# Patient Record
Sex: Female | Born: 1945
Health system: Southern US, Community
[De-identification: ages and names within clinical notes are randomized; demographics above are authoritative.]

## PROBLEM LIST (undated history)

## (undated) DIAGNOSIS — M1990 Unspecified osteoarthritis, unspecified site: Secondary | ICD-10-CM

## (undated) DIAGNOSIS — IMO0001 Reserved for inherently not codable concepts without codable children: Secondary | ICD-10-CM

## (undated) DIAGNOSIS — I219 Acute myocardial infarction, unspecified: Secondary | ICD-10-CM

## (undated) DIAGNOSIS — G43909 Migraine, unspecified, not intractable, without status migrainosus: Secondary | ICD-10-CM

## (undated) DIAGNOSIS — G4730 Sleep apnea, unspecified: Secondary | ICD-10-CM

## (undated) DIAGNOSIS — E119 Type 2 diabetes mellitus without complications: Secondary | ICD-10-CM

## (undated) DIAGNOSIS — R0602 Shortness of breath: Secondary | ICD-10-CM

## (undated) DIAGNOSIS — K219 Gastro-esophageal reflux disease without esophagitis: Secondary | ICD-10-CM

## (undated) DIAGNOSIS — J45909 Unspecified asthma, uncomplicated: Secondary | ICD-10-CM

## (undated) DIAGNOSIS — S82899A Other fracture of unspecified lower leg, initial encounter for closed fracture: Secondary | ICD-10-CM

## (undated) DIAGNOSIS — M545 Low back pain, unspecified: Secondary | ICD-10-CM

## (undated) DIAGNOSIS — M810 Age-related osteoporosis without current pathological fracture: Secondary | ICD-10-CM

## (undated) DIAGNOSIS — G8929 Other chronic pain: Secondary | ICD-10-CM

## (undated) DIAGNOSIS — H409 Unspecified glaucoma: Secondary | ICD-10-CM

## (undated) DIAGNOSIS — M109 Gout, unspecified: Secondary | ICD-10-CM

## (undated) DIAGNOSIS — D649 Anemia, unspecified: Secondary | ICD-10-CM

## (undated) DIAGNOSIS — E669 Obesity, unspecified: Secondary | ICD-10-CM

## (undated) DIAGNOSIS — J449 Chronic obstructive pulmonary disease, unspecified: Secondary | ICD-10-CM

## (undated) DIAGNOSIS — I10 Essential (primary) hypertension: Secondary | ICD-10-CM

## (undated) HISTORY — DX: Chronic obstructive pulmonary disease, unspecified: J44.9

## (undated) HISTORY — DX: Reserved for inherently not codable concepts without codable children: IMO0001

## (undated) HISTORY — DX: Unspecified glaucoma: H40.9

## (undated) HISTORY — PX: TONSILLECTOMY: SUR1361

## (undated) HISTORY — DX: Other fracture of unspecified lower leg, initial encounter for closed fracture: S82.899A

## (undated) HISTORY — DX: Other chronic pain: G89.29

## (undated) HISTORY — DX: Obesity, unspecified: E66.9

## (undated) HISTORY — PX: JOINT REPLACEMENT: SHX530

---

## 1978-11-02 HISTORY — PX: GANGLION CYST EXCISION: SHX1691

## 1978-11-02 HISTORY — PX: KNEE LIGAMENT RECONSTRUCTION: SHX1895

## 1988-11-01 HISTORY — PX: ABDOMINAL HYSTERECTOMY: SHX81

## 1990-03-03 DIAGNOSIS — I219 Acute myocardial infarction, unspecified: Secondary | ICD-10-CM

## 1990-03-03 HISTORY — DX: Acute myocardial infarction, unspecified: I21.9

## 1997-09-14 ENCOUNTER — Ambulatory Visit (HOSPITAL_COMMUNITY): Admission: RE | Admit: 1997-09-14 | Discharge: 1997-09-14 | Payer: Self-pay | Admitting: Pulmonary Disease

## 1997-12-15 ENCOUNTER — Ambulatory Visit (HOSPITAL_COMMUNITY): Admission: RE | Admit: 1997-12-15 | Discharge: 1997-12-15 | Payer: Self-pay | Admitting: Neurosurgery

## 1997-12-29 ENCOUNTER — Ambulatory Visit (HOSPITAL_COMMUNITY): Admission: RE | Admit: 1997-12-29 | Discharge: 1997-12-29 | Payer: Self-pay | Admitting: Neurosurgery

## 1997-12-29 ENCOUNTER — Encounter: Payer: Self-pay | Admitting: Neurosurgery

## 1998-01-12 ENCOUNTER — Encounter: Payer: Self-pay | Admitting: Neurosurgery

## 1998-01-12 ENCOUNTER — Ambulatory Visit (HOSPITAL_COMMUNITY): Admission: RE | Admit: 1998-01-12 | Discharge: 1998-01-12 | Payer: Self-pay | Admitting: Neurosurgery

## 1998-05-31 ENCOUNTER — Emergency Department (HOSPITAL_COMMUNITY): Admission: EM | Admit: 1998-05-31 | Discharge: 1998-05-31 | Payer: Self-pay | Admitting: Emergency Medicine

## 1998-08-28 ENCOUNTER — Encounter: Payer: Self-pay | Admitting: Neurosurgery

## 1998-08-28 ENCOUNTER — Ambulatory Visit (HOSPITAL_COMMUNITY): Admission: RE | Admit: 1998-08-28 | Discharge: 1998-08-28 | Payer: Self-pay | Admitting: Neurosurgery

## 1998-08-29 ENCOUNTER — Ambulatory Visit (HOSPITAL_COMMUNITY): Admission: RE | Admit: 1998-08-29 | Discharge: 1998-08-29 | Payer: Self-pay | Admitting: Neurosurgery

## 1998-08-29 ENCOUNTER — Encounter: Payer: Self-pay | Admitting: Neurosurgery

## 1999-03-04 HISTORY — PX: TOTAL KNEE ARTHROPLASTY: SHX125

## 1999-04-19 ENCOUNTER — Encounter: Payer: Self-pay | Admitting: Orthopedic Surgery

## 1999-04-23 ENCOUNTER — Inpatient Hospital Stay (HOSPITAL_COMMUNITY): Admission: RE | Admit: 1999-04-23 | Discharge: 1999-04-29 | Payer: Self-pay | Admitting: Orthopedic Surgery

## 1999-06-04 ENCOUNTER — Encounter: Admission: RE | Admit: 1999-06-04 | Discharge: 1999-06-25 | Payer: Self-pay | Admitting: Orthopedic Surgery

## 2000-06-13 ENCOUNTER — Emergency Department (HOSPITAL_COMMUNITY): Admission: EM | Admit: 2000-06-13 | Discharge: 2000-06-13 | Payer: Self-pay | Admitting: *Deleted

## 2000-06-13 ENCOUNTER — Encounter: Payer: Self-pay | Admitting: *Deleted

## 2001-07-17 ENCOUNTER — Emergency Department (HOSPITAL_COMMUNITY): Admission: EM | Admit: 2001-07-17 | Discharge: 2001-07-17 | Payer: Self-pay

## 2001-08-26 ENCOUNTER — Encounter: Payer: Self-pay | Admitting: Orthopedic Surgery

## 2001-08-26 ENCOUNTER — Encounter: Admission: RE | Admit: 2001-08-26 | Discharge: 2001-08-26 | Payer: Self-pay | Admitting: Orthopedic Surgery

## 2002-03-03 HISTORY — PX: TOTAL KNEE ARTHROPLASTY: SHX125

## 2002-04-04 ENCOUNTER — Ambulatory Visit (HOSPITAL_COMMUNITY): Admission: RE | Admit: 2002-04-04 | Discharge: 2002-04-04 | Payer: Self-pay | Admitting: Pulmonary Disease

## 2002-04-04 ENCOUNTER — Encounter: Payer: Self-pay | Admitting: Pulmonary Disease

## 2002-04-05 ENCOUNTER — Encounter: Payer: Self-pay | Admitting: Emergency Medicine

## 2002-04-05 ENCOUNTER — Emergency Department (HOSPITAL_COMMUNITY): Admission: EM | Admit: 2002-04-05 | Discharge: 2002-04-05 | Payer: Self-pay | Admitting: Emergency Medicine

## 2002-05-26 ENCOUNTER — Encounter: Admission: RE | Admit: 2002-05-26 | Discharge: 2002-05-26 | Payer: Self-pay | Admitting: Pulmonary Disease

## 2002-05-26 ENCOUNTER — Encounter: Payer: Self-pay | Admitting: Pulmonary Disease

## 2002-05-27 ENCOUNTER — Encounter: Payer: Self-pay | Admitting: Orthopedic Surgery

## 2002-05-31 ENCOUNTER — Inpatient Hospital Stay (HOSPITAL_COMMUNITY): Admission: RE | Admit: 2002-05-31 | Discharge: 2002-06-07 | Payer: Self-pay | Admitting: Orthopedic Surgery

## 2002-06-03 ENCOUNTER — Encounter: Payer: Self-pay | Admitting: Orthopedic Surgery

## 2002-06-04 ENCOUNTER — Encounter: Payer: Self-pay | Admitting: Orthopedic Surgery

## 2002-06-07 ENCOUNTER — Inpatient Hospital Stay (HOSPITAL_COMMUNITY)
Admission: RE | Admit: 2002-06-07 | Discharge: 2002-06-11 | Payer: Self-pay | Admitting: Physical Medicine & Rehabilitation

## 2002-07-12 ENCOUNTER — Encounter: Admission: RE | Admit: 2002-07-12 | Discharge: 2002-07-27 | Payer: Self-pay | Admitting: Orthopedic Surgery

## 2002-08-11 ENCOUNTER — Encounter: Admission: RE | Admit: 2002-08-11 | Discharge: 2002-08-11 | Payer: Self-pay | Admitting: Pulmonary Disease

## 2002-08-11 ENCOUNTER — Encounter: Payer: Self-pay | Admitting: Pulmonary Disease

## 2002-08-30 ENCOUNTER — Encounter: Admission: RE | Admit: 2002-08-30 | Discharge: 2002-08-30 | Payer: Self-pay | Admitting: Pulmonary Disease

## 2002-08-30 ENCOUNTER — Encounter: Payer: Self-pay | Admitting: Pulmonary Disease

## 2002-11-09 ENCOUNTER — Other Ambulatory Visit: Admission: RE | Admit: 2002-11-09 | Discharge: 2002-11-09 | Payer: Self-pay | Admitting: Obstetrics and Gynecology

## 2002-11-09 ENCOUNTER — Encounter: Payer: Self-pay | Admitting: Pulmonary Disease

## 2002-11-09 ENCOUNTER — Ambulatory Visit (HOSPITAL_COMMUNITY): Admission: RE | Admit: 2002-11-09 | Discharge: 2002-11-09 | Payer: Self-pay | Admitting: Pulmonary Disease

## 2002-11-16 ENCOUNTER — Encounter: Payer: Self-pay | Admitting: Pulmonary Disease

## 2002-11-16 ENCOUNTER — Ambulatory Visit (HOSPITAL_COMMUNITY): Admission: RE | Admit: 2002-11-16 | Discharge: 2002-11-16 | Payer: Self-pay | Admitting: Pulmonary Disease

## 2002-11-17 ENCOUNTER — Ambulatory Visit (HOSPITAL_COMMUNITY): Admission: RE | Admit: 2002-11-17 | Discharge: 2002-11-17 | Payer: Self-pay | Admitting: Obstetrics and Gynecology

## 2002-11-17 ENCOUNTER — Encounter: Payer: Self-pay | Admitting: Obstetrics and Gynecology

## 2003-01-23 ENCOUNTER — Encounter (INDEPENDENT_AMBULATORY_CARE_PROVIDER_SITE_OTHER): Payer: Self-pay

## 2003-01-23 ENCOUNTER — Ambulatory Visit (HOSPITAL_COMMUNITY): Admission: RE | Admit: 2003-01-23 | Discharge: 2003-01-23 | Payer: Self-pay | Admitting: *Deleted

## 2003-12-06 ENCOUNTER — Encounter: Admission: RE | Admit: 2003-12-06 | Discharge: 2004-01-31 | Payer: Self-pay | Admitting: Pulmonary Disease

## 2004-06-17 ENCOUNTER — Ambulatory Visit (HOSPITAL_COMMUNITY): Admission: RE | Admit: 2004-06-17 | Discharge: 2004-06-17 | Payer: Self-pay | Admitting: Pulmonary Disease

## 2005-09-09 ENCOUNTER — Other Ambulatory Visit: Admission: RE | Admit: 2005-09-09 | Discharge: 2005-09-09 | Payer: Self-pay | Admitting: Obstetrics and Gynecology

## 2006-05-21 ENCOUNTER — Ambulatory Visit (HOSPITAL_BASED_OUTPATIENT_CLINIC_OR_DEPARTMENT_OTHER): Admission: RE | Admit: 2006-05-21 | Discharge: 2006-05-21 | Payer: Self-pay | Admitting: Pulmonary Disease

## 2006-05-24 ENCOUNTER — Ambulatory Visit: Payer: Self-pay | Admitting: Internal Medicine

## 2006-10-29 ENCOUNTER — Emergency Department (HOSPITAL_COMMUNITY): Admission: EM | Admit: 2006-10-29 | Discharge: 2006-10-29 | Payer: Self-pay | Admitting: Emergency Medicine

## 2007-01-06 ENCOUNTER — Ambulatory Visit (HOSPITAL_COMMUNITY): Admission: RE | Admit: 2007-01-06 | Discharge: 2007-01-06 | Payer: Self-pay | Admitting: Pulmonary Disease

## 2007-04-09 ENCOUNTER — Ambulatory Visit (HOSPITAL_COMMUNITY): Admission: RE | Admit: 2007-04-09 | Discharge: 2007-04-09 | Payer: Self-pay | Admitting: Pulmonary Disease

## 2007-07-14 ENCOUNTER — Emergency Department (HOSPITAL_COMMUNITY): Admission: EM | Admit: 2007-07-14 | Discharge: 2007-07-14 | Payer: Self-pay | Admitting: Emergency Medicine

## 2008-11-17 ENCOUNTER — Ambulatory Visit (HOSPITAL_COMMUNITY): Admission: RE | Admit: 2008-11-17 | Discharge: 2008-11-17 | Payer: Self-pay | Admitting: Pulmonary Disease

## 2009-03-24 ENCOUNTER — Emergency Department (HOSPITAL_COMMUNITY): Admission: EM | Admit: 2009-03-24 | Discharge: 2009-03-24 | Payer: Self-pay | Admitting: Emergency Medicine

## 2009-03-27 ENCOUNTER — Observation Stay (HOSPITAL_COMMUNITY): Admission: EM | Admit: 2009-03-27 | Discharge: 2009-03-28 | Payer: Self-pay | Admitting: Emergency Medicine

## 2010-05-20 LAB — BASIC METABOLIC PANEL
BUN: 10 mg/dL (ref 6–23)
CO2: 28 mEq/L (ref 19–32)
Chloride: 103 mEq/L (ref 96–112)
Creatinine, Ser: 0.92 mg/dL (ref 0.4–1.2)
Potassium: 3.4 mEq/L — ABNORMAL LOW (ref 3.5–5.1)
Sodium: 138 mEq/L (ref 135–145)

## 2010-05-20 LAB — CBC
Hemoglobin: 12 g/dL (ref 12.0–15.0)
MCHC: 33.5 g/dL (ref 30.0–36.0)
Platelets: 247 10*3/uL (ref 150–400)
RBC: 4.08 MIL/uL (ref 3.87–5.11)
RDW: 13.9 % (ref 11.5–15.5)
WBC: 4.4 10*3/uL (ref 4.0–10.5)

## 2010-05-20 LAB — COMPREHENSIVE METABOLIC PANEL
ALT: 14 U/L (ref 0–35)
Alkaline Phosphatase: 63 U/L (ref 39–117)
BUN: 10 mg/dL (ref 6–23)
CO2: 28 mEq/L (ref 19–32)
GFR calc Af Amer: >60 mL/min (ref >60)
GFR calc non Af Amer: >60 mL/min (ref >60)
Glucose, Bld: 85 mg/dL (ref 70–99)
Total Protein: 7.9 g/dL (ref 6.0–8.3)

## 2010-05-20 LAB — URINALYSIS, ROUTINE W REFLEX MICROSCOPIC
Glucose, UA: NEGATIVE mg/dL
Hgb urine dipstick: NEGATIVE
Protein, ur: NEGATIVE mg/dL
Specific Gravity, Urine: 1.021 (ref 1.005–1.030)
Urobilinogen, UA: 0.2 mg/dL (ref 0.0–1.0)

## 2010-05-20 LAB — DIFFERENTIAL
Basophils Relative: 1 % (ref 0–1)
Eosinophils Absolute: 0.1 10*3/uL (ref 0.0–0.7)
Eosinophils Absolute: 0.1 10*3/uL (ref 0.0–0.7)
Eosinophils Relative: 3 % (ref 0–5)
Eosinophils Relative: 3 % (ref 0–5)
Lymphs Abs: 1.9 10*3/uL (ref 0.7–4.0)
Monocytes Absolute: 0.5 10*3/uL (ref 0.1–1.0)
Monocytes Relative: 11 % (ref 3–12)
Neutrophils Relative %: 55 % (ref 43–77)

## 2010-07-19 NOTE — Discharge Summary (Signed)
NAME:  Sandra Brennan, Sandra Brennan                         ACCOUNT NO.:  [id number]   MEDICAL RECORD NO.:  [id number]                   PATIENT TYPE:  IPS   LOCATION:  4155                                 FACILITY:  [facility]   PHYSICIAN:  Ranelle Oyster, M.D.             DATE OF BIRTH:  December 29, 1945   DATE OF ADMISSION:  06/07/2002  DATE OF DISCHARGE:  06/11/2002                                 DISCHARGE SUMMARY   DISCHARGE DIAGNOSES:  1. Right total arthroplasty secondary to osteoarthritis.  2. History of hypertension.  3. Urinary tract infection.   HISTORY OF PRESENT ILLNESS:  The patient is a 65 year old black female with  a past medical history of obesity, right knee pain times four to five years,  admitted on May 31, 2002 for right total knee arthroplasty secondary to  DJD by Dr. Lenard Galloway. Mort[place].  She is presently on Arixtra for DVT  prophylaxis.  PT report at this time reveals the patient is partial  weightbearing 50%, ambulating 100 feet with rolling walker, can transfer at  supervision level.  Hospital course was significant for hyponatremia, anemia  and elevated temperature secondary to UTI, presently on Tequin.  She is to  follow up with Dr. Chaney Malling within one week after discharge.  The patient  was transferred to [facility] Acute Care Department on June 07, 2002.   PRIMARY CARE Elleana Stillson:  Dr. Mina Marble.   PAST MEDICAL HISTORY:  Past medical history is significant for asthma, CAD,  MI, GERD, DDD, obesity and OA.   PAST SURGICAL HISTORY:  Past surgical history is significant for a left  total knee arthroplasty, hysterectomy, right reconstructive surgery.   ALLERGIES:  THEOPHYLLINE.   SOCIAL HISTORY:  The patient lives in a one-level home with ramp alone in  [place], separated.  She has four children.  She is disabled, previously  a Engineer, site.  She was independent prior to admission.   FAMILY HISTORY:  Family history is noncontributory.   REVIEW OF  SYSTEMS:  Review of systems is significant for obesity, joint pain  and chest pain.   HOSPITAL COURSE:  The patient was admitted to [facility] Rehab  Department on June 07, 2002 for comprehensive inpatient rehabilitation,  where she received more than three hours of therapy daily.  Overall, the  patient made great progress during her short four-day stay in rehab.  She  was discharged on a modified independent level.  She will remain on Arixtra  for approximately 7 to 10 days for DVT prophylaxis.  Hospital course was  significant for urinary tract infection, constipation.  The patient received  sorbitol p.r.n. as needed and constipation did resolve.  Initially, on June 10, 2002, she was started on amoxicillin 250 mg p.o. for seven days t.i.d.  for urinary tract infection.  Urine culture came back demonstrating  resistance to amoxicillin, therefore, at time of discharge, she was switched  to Macrodantin 100 mg p.o. b.i.d. x5 days.  Initially, the patient's Maxzide  was placed on hold due to hyponatremia.  Sodium level had begun to improve  and blood pressure then was slightly elevated; at that point she was started  back on her Maxzide.  Pain was being controlled with Vicodin as needed.  There were no other major issues that occurred while in rehab.  She made  great progress in a short time.  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  There were no other major issues  that occurred while in rehab.   Latest labs indicate latest hemoglobin was 9.3, hematocrit was 28.6, white  blood cell count was 2.8, platelet count was 367,000.  Latest sodium was  134, potassium 4.0, chloride 94, CO2 30, glucose 138, BUN 11, creatinine  1.1, AST 18, ALT 18 as well.  Urine cultures performed on June 07, 2002:  Greater than 100,000 colonies of Staphylococcus species, coagulase-negative.  At  time of discharge, per PT report, all vitals were stable, blood pressure  131/64, respiratory rate was 20, pulse 105, temperature was 99.5.  PT report  indicated that the patient is ambulating, modified independently, with  standard walker 200 feet, can transfer sit to stand, modified independently,  bed mobility -- modified independent.  She can perform most ADLs, modified  independently.  At time of discharge, staples are intact, healing well and  staples are to be removed at Dr. Andreas Blower office per his request.  At  discharge, the patient did have a slight temperature, but antibiotic was  switched in hopes this will improve.   DISCHARGE MEDICATIONS:  1. Macrodantin 100 mg p.o. b.i.d.  2. Maxzide resumed.  3. Vicodin one to two tablets every four to six hours as needed for pain.  4. Tylenol as needed for pain.  5. Multivitamin with iron daily.  6. Os-Cal plus D daily.   PAIN MANAGEMENT:  She will have pain management with Tylenol and Vicodin.   ACTIVITY:  She is to use a walker.  No driving, no drinking alcohol.  She  may maintain partial weightbearing of 50%.   WOUND CARE:  She is to have staples removed at Dr. Andreas Blower office on  June 13, 2002 at 2 p.m.   SPECIAL DISCHARGE INSTRUCTIONS:  She will have [facility] for PT,  OT, and R.N.    FOLLOWUP:  She will have followup in Dr. Andreas Blower office next Monday,  June 13, 2002, as stated above, follow up with Dr. Petra Kuba within four  to six weeks to monitor anemia, follow up with Dr. Ranelle Oyster as  needed.      Junie Bame, P.A.                       Ranelle Oyster, M.D.    LH/MEDQ  D:  07/14/2002  T:  07/16/2002  Job:  [id number]   cc:   Eino Farber., M.D.  [address] [place]  [place] [address]  Fax: [phone]   Lenard Galloway. Chaney Malling, M.D.  [address] [place]  [place] [address]  Fax: [phone]

## 2010-07-19 NOTE — H&P (Signed)
NAME:  Sandra Brennan, Sandra Brennan                         ACCOUNT NO.:  [id number]   MEDICAL RECORD NO.:  [id number]                   PATIENT TYPE:  INP   LOCATION:  NA                                   FACILITY:  [facility]   PHYSICIAN:  Thereasa Distance A. Chaney Malling, M.D.           DATE OF BIRTH:  07-29-1945   DATE OF ADMISSION:  05/31/2002  DATE OF DISCHARGE:                                HISTORY & PHYSICAL   CHIEF COMPLAINT:  Right knee pain for the last 4-5 years.   HISTORY OF PRESENT ILLNESS:  This 65 year old black female patient presented  to Dr. Chaney Malling with a history of a left knee replacement by him on  April 23, 1999.  Her left knee has been doing well but she has been  having more problems with her right knee.  She does have a history of a fall  out of a two-story window approximately 20 years ago and she had to have  right knee reconstruction by Dr. Fraser Din approximately 20 years ago.  Over  the last 4-5 years, she has had a gradual onset of progressively worsening  right knee pain.  No new injury but the pain has gotten worse over the last  year.   At this point, the pain in the right knee is described as a constant  soreness which becomes kind of sharp and stabbing with certain movements.  The pain seems to be located diffusely about the joint line with some  radiation proximally into her thigh and distally into her calf.  She does  have a history of degenerative disk disease in the lumbar spine and reports  that Dr. Franky Macho has wanted to do a fusion of her lumbar spine.  She has  just recently received an epidural steroid injection and that caused the  pain that was in her right thigh to be dissipated.  The pain in her right  knee does increase with certain movements and if she walks or stands for a  prolonged period of time.  The pain decreased with elevation and if she rubs  the knee.  She is not currently taking any medications for pain and she has  not recently had any cortisone  or Hyalgan in the knee.  She does complain of  the knee popping, grinding, and swelling at times.  It also gives way on  her.  She had an episode approximately six weeks ago of the knee locking on  her and she had to go to the ER before it would finally release.  She does  have a cane, walker, and crutches at home which she uses just  intermittently.   ALLERGIES:  1. THEOPHYLLINE causes welts and hyperventilation.  2. BEE STINGS cause swelling.   CURRENT MEDICATIONS:  1. Maxzide 37.5/25 mg 1/2 tablet p.o. daily.  2. Arthrotec 75 mg 1 tablet p.o. b.i.d.  3. Flonase Nasal Spray 1 spray each nares p.r.n.  PAST MEDICAL HISTORY:  1. She has had hypertension for the last 10 years.  2. Asthma--she has had it since a child.  She has flares with increased     stress or increased dust in the air.  This has been treated with p.r.n.     inhalers.  Her last attack was approximately a year ago and at that time     she goes to either Urgent Care or the hospital for albuterol treatments.  3. History of myocardial infarction in 1992 followed by Dr. Petra Kuba.  4. Degenerative disk disease of the lumbar spine treated by Dr. Coletta Memos.  5. Gastroesophageal reflux disease for the last 2-3 years treated with diet     only at this time.  6. She denies any history of diabetes mellitus, thyroid disease, hiatal     hernia, peptic ulcer disease, or any other chronic medical condition     other than noted previously.   PAST SURGICAL HISTORY:  1. Hysterectomy by Dr. Iran Planas in 1991.  2. Repair of torn right knee ligaments, Dr. Fraser Din, 20 years ago.  3. Excision of right wrist ganglion by Dr. Shon Hough.  4. Left knee arthroscopy by Dr. Rinaldo Ratel October 26, 1987.  5. Tonsillectomy as a child.  6. Left total knee arthroplasty by Dr. Rinaldo Ratel April 23, 1999.   SOCIAL HISTORY:  She has a history of being an alcoholic but has been sober  for about 20 years.  When she was an  alcoholic, she drank approximately a 12-  pack or more on the weekend for about four years.  She denies any history of  cigarette smoking or drug use.  She is separated and has five children.  Four of her children are still living.  She lives by herself in a one-story  house with a ramp.  She is a disabled Manufacturing systems engineer.  Her medical doctor  is Dr. Petra Kuba here in [place].   FAMILY HISTORY:  Her mother is alive at age 42 with hypertension, history of  a stroke, and ovarian cancer.  Her father died at the age of 74 with a  history of alcoholism, heart disease, hypertension, and diabetes mellitus.  She has three living brothers age 79, 43, and 62 and they are all healthy.  She has one brother who died at age 8 due to a motor vehicle accident.  She  has three sisters; one age 45 with history of ovarian cancer, one age 28  with acquired immunodeficiency syndrome, and one age 74 who is healthy.  Her  35 year old son was murdered.  She has a 71 year old daughter with heart  disease and then three other children ages 86, 5, and 48 with the 31-year-  old son being a paraplegic from a motor vehicle accident.   REVIEW OF SYSTEMS:  She has a history of migraines but has not had one for  25+ years.  She complains of some sinus congestion with her asthma attacks  and with increased allergens in the air.  She does avoid stairs because of  the pain in her knee and she does get short of breath with that.  She has  had the hepatitis vaccine.  She wears glasses.  She recently had a CT scan  to evaluate some vision changes and headaches and that was negative for any  pathology.  She has been having some problems with constipation, which she  is treating with diet.  She has recently had  a chronic severe yeast  infection and she was scheduled to see the OB/GYN on March 30 but will  reschedule that for after surgery.  She does have severe degenerative disk disease in her lumbar spine and had her  first epidural steroid treatment on  May 26, 2002.  She does have some intermittent numbness in both feet.  She  does not have a living will nor a power-of-attorney.  All other systems are  negative and noncontributory.   PHYSICAL EXAMINATION:  GENERAL:  A well-developed, well-nourished, obese  black female who walks with an antalgic gait and a significant right-sided  limp.  Mood and affect are appropriate.  Talks easily with the examiner.  Height is 5 feet 1 inch, weight is 280 pounds.  BMI is 52-1/2.  VITAL SIGNS:  Temperature 98.1 degrees Fahrenheit, pulse 76, respirations  22, and blood pressure 136/90.  HEENT:  Normocephalic, atraumatic without frontal or maxillary sinus  tenderness to palpation.  Conjunctivae are pink.  Sclerae are anicteric.  PERRLA.  EOM's intact.  No visible external ear deformities.  Hearing  grossly intact.  Tympanic membranes pearly-gray bilaterally with good light  reflex.  Nose and nasal septum midline.  Nasal mucosa pink and moist without  exudates or polyps noted.  Buccal mucosa pink and moist.  She has several  missing teeth but otherwise dentition in good repair.  Pharynx without  erythema or exudates.  Tongue and uvula midline.  Tongue without  vesiculations and uvula rises equally with phonation.  NECK:  No visible masses or lesions noted.  Trachea midline.  No palpable  lymphadenopathy nor thyromegaly.  Carotids +2 bilaterally without bruits.  Full range of motion and nontender to palpation along the cervical spine.  CARDIOVASCULAR:  Heart rate and rhythm regular.  S1 and S2 present without  rubs, clicks, or murmurs noted.  RESPIRATORY:  Respirations even and unlabored.  Breath sounds clear to  auscultation bilaterally without rales or wheezes noted.  ABDOMEN:  Large, obese abdominal contour.  Bowel sounds present x4  quadrants.  She is soft and nontender to palpation without  hepatosplenomegaly nor CVA tenderness.  Her femoral pulses are +2   bilaterally and she is nontender to palpation along the entire length of the  vertebral column.  BREASTS/GENITOURINARY/RECTAL/PELVIC:  These exams deferred at this time.  MUSCULOSKELETAL:  She has no obvious deformities of her bilateral upper  extremities with full range of motion of her elbows, wrists, and fingers  bilaterally.  Radial pulses are +2.  She has good range of motion of both  shoulders but does complain of pain with adduction or internal/external  rotation of both shoulders.  She has full range of motion of her hips,  ankles, and toes bilaterally.  DP and PT pulses are +2.  She has mild +1  bilateral lower extremity edema which is nonpitting.  Negative Homans sign  and no calf pain with palpation bilaterally.  The left knee has a well-  healed slightly widened midline incision line.  There is no erythema or  ecchymosis noted.  She has full extension and flexion to 100 degrees.  There is no crepitus with range of motion and she has no pain with palpation along  the joint line.  The joint is stable to varus and valgus stress and has a  negative anterior drawer.  Right knee has a well-healed anteromedial  incision line and then again far lateral incision line.  Skin is otherwise  intact without erythema or ecchymosis.  She has full extension and flexion  to 90 degrees with a large amount of crepitus with range of motion.  She is  acutely tender to palpation over the medial joint lines, none laterally.  She also complains of pain when testing varus and valgus stress and she  tenses up, so it is difficult to tell if she opens up with that.  Negative  anterior drawer.  NEUROLOGIC:  Alert and oriented x3.  Cranial nerves II-XII are grossly  intact.  Strength is 5/5 bilateral upper and lower extremities.  Rapid  alternating movements intact.  Deep tendon reflexes 2+ bilateral upper and  1+ bilateral lower extremities.   RADIOLOGIC FINDINGS:  X-rays taken of her right knee on May 25, 2002 show  absolutely no joint space in the medial compartment of the right knee with  several pins noted in the tibia.  She has significant osteophytes on both  the medial and lateral aspects of the femur and tibia.  Significant  arthritic spurring noted in the patellofemoral compartment.   IMPRESSION:  1. End-stage osteoarthritis of the right knee, status post left knee     replacement in February 2001.  2. Hypertension.  3. Asthma.  4. Obesity.  5. Gastroesophageal reflux disease.  6. Degenerative disk disease of the lumbar spine, status post epidural     steroid May 26, 2002.  7. History of myocardial infarction in 1992.   PLAN:  The patient will be admitted to [facility] on May 31, 2002  where she will undergo a right total knee arthroplasty by Dr. Rinaldo Ratel.  She will undergo all the routine preoperative laboratory tests  and studies prior to this procedure.  She has recently been seen by Dr.  Petra Kuba and she will notify him of her upcoming surgery.     Legrand Pitts Duffy, P.A.                      Rodney A. Chaney Malling, M.D.    KED/MEDQ  D:  05/27/2002  T:  05/28/2002  Job:  [id number]

## 2010-07-19 NOTE — Procedures (Signed)
NAME:  Sandra Brennan, Sandra Brennan NO.:  [id number]   MEDICAL RECORD NO.:  [id number]          PATIENT TYPE:  OUT   LOCATION:  SLEEP CENTER                 [facility]ACILITY[facility]  [facility]   PHYSICIAN:  Clinton D. Maple Hudson, MD, FCCP, FACPDATE OF BIRTH:  October 27, 1945   DATE OF STUDY:                            NOCTURNAL POLYSOMNOGRAM   INDICATION FOR STUDY:  Hypersomnia with sleep apnea.   EPWORTH SLEEPINESS SCORE:  2/24; BMI 58.8; weight 300 pounds.   MEDICATIONS:  Listed and reviewed.   SLEEP ARCHITECTURE:  Total sleep time short, 274 minutes with sleep  deficiency 63%, stage I was 6%, stage II 64%, stages III and IV 16%, REM  14% of total sleep time.  Sleep latency 19 minutes, REM latency 131  minutes, awake after sleep onset 134 minutes, arousal index 13.4.  Arthrotec was taken at 9 p.m.   RESPIRATORY DATA:  Split study protocol.  Apnea/hypopnea index (AHI/RDI)  41.3 obstructive events per hour indicating moderately severe  obstructive sleep apnea/hypopnea syndrome before CPAP.  There were 41  obstructive apneas and 51 hypopnea's before CPAP.  Events were  positional, more common while supine.  REM/AHI 38.4.  CPAP was titrated  to 16 CWP, AHI 2.5 per hour.  A medium Respironics comfort full mask was  used with heated humidifier.   OXYGEN DATA:  Moderate snoring with oxygen desaturation to a nadir of  54% before CPAP.  After CPAP control, saturation held 94-95% on room  air.   CARDIAC DATA:  Sinus rhythm with occasional PVC.   MOVEMENT-PARASOMNIA:  Rare lens jerk, insignificant.   IMPRESSIONS-RECOMMENDATIONS:  1. Moderately severe obstructive sleep apnea/hypopnea syndrome, AHI      41.3 per hour, non-positional events with moderate snoring and      oxygen desaturation to a nadir of 54%.  2. Successful CPAP titration to 16 CWP, AHI 2.5 per hour.  A medium      Respironics Comfort Full mask was used with heated humidifier.     Clinton D. Maple Hudson, MD, [facility], FACP  Diplomate, Research scientist (medical) of Sleep Medicine  Electronically Signed    CDY/MEDQ  D:  05/24/2006 17:58:55  T:  05/25/2006 06:22:56  Job:  [id number]

## 2010-07-19 NOTE — Op Note (Signed)
NAME:  Sandra Brennan, Sandra Brennan                         ACCOUNT NO.:  [id number]   MEDICAL RECORD NO.:  [id number]                   PATIENT TYPE:  AMB   LOCATION:  ENDO                                 FACILITY:  [facility]   PHYSICIAN:  Georgiana Spinner, M.D.                 DATE OF BIRTH:  09/19/1945   DATE OF PROCEDURE:  DATE OF DISCHARGE:                                 OPERATIVE REPORT   PROCEDURE:  Colonoscopy with biopsy.   INDICATIONS FOR PROCEDURE:  Colon polyp seen on barium enema in rectum.   ANESTHESIA:  Demerol 80, Versed 9 mg.   DESCRIPTION OF PROCEDURE:  With the patient mildly sedated in the left  lateral decubitus position, the Olympus videoscopic colonoscope was inserted  in the rectum and passed under direct vision to the cecum identified by the  ileocecal valve and appendiceal orifice both of which were photographed.  From this point, the colonoscope was slowly withdrawn taking circumferential  views of the colonic mucosa stopping only in the rectum which appeared  normal except for a small polyp in the rectum that was seen, photographed  and removed using hot biopsy forceps technique on a setting of 20:20 blended  current. On retroflexed view, internal hemorrhoids were seen and  photographed. The endoscope was straightened and withdrawn. The patient's  vital signs and pulse oximeter remained stable. The patient tolerated the  procedure well without apparent complications.   FINDINGS:  Polyp in rectum removed. Await biopsy report. The patient will  call me for results and followup with me as an outpatient.                                               Georgiana Spinner, M.D.    GMO/MEDQ  D:  01/23/2003  T:  01/23/2003  Job:  [id number]   cc:   Eino Farber., M.D.  [address] [place]  [place] [address]  Fax: [phone]

## 2010-07-19 NOTE — Op Note (Signed)
[place]. [facility]  Patient:    Sandra Brennan, Sandra Brennan                        MRN: [id number] Proc. Date: 04/23/99 Adm. Date:  [id number] Attending:  Cornell Barman                           Operative Report  PREOPERATIVE DIAGNOSIS:  Severe osteoarthritis, left knee.  POSTOPERATIVE DIAGNOSIS:  Severe osteoarthritis, left knee.  OPERATION PERFORMED:  Total knee replacement on the left using a deep dish rotating platform insert 10 mm thick with a standard sized three-peg cemented patella with a standard sized left primary cemented femoral component and a standard plus rotating platform cemented to the component with Depuy bone cement.  SURGEON:  Lenard Galloway. Mortenson, M.D.  ASSISTANT:  ANESTHESIA:  General.  DESCRIPTION OF PROCEDURE:  The patient was placed on the operating table in the  supine position with a pneumatic tourniquet about the left upper thigh.  After satisfactory oral endotracheal anesthesia, the left lower extremity was prepped  with DuraPrep and draped out in the usual manner.  The leg was then wrapped out  with an Esmarch and the tournquet was elevated.  A midline incision was made starting above the patella and carried down to the tibial tubercle.  The skin edges were retracted.  Bleeders were coagulated.  A long median parapatellar incision was made with the cutting cautery.  The patella was everted, displaced laterally.  lateral retinacular release was done which gave excellent access to the knee. he knee was the flexed to better than 90 degrees.  Both medial and lateral meniscus were excised.  Tibial guide #1 was placed over the proximal end of the tibia and the external guide was used to line up the cutting block.  The cutting block was fixed with fixation pins.  The guide was removed and the CapSure guide was placed over the cutting block and initial tibial cut was made.  An excellent cut of the proximal tibia was  achieved.  Remnants of the posterior horn and medial and lateral meniscus were excised.  The cruciate ligaments were released.  The knee was then flexed about 100 degrees.  Femoral guide #1 was placed over the anterior aspect of the femur and held in position.  A drill was then used to place a hole in the medullary cavity.  A rod was placed in this hole and the C-clamp was used to place in femoral guide #1 and placed on a cut portion of the proximal tibia with the nee cut in 90 degrees.  This set up the proper rotation for the femoral guide and this was locked in placed with fixation pins.  Anterior and posterior cuts were made on the distal end of the femur using the CapSure guide.  Guide #2 was placed on the anterior medullary rod on the anterior aspect of the femur and held in position  with fixation pins.  This was a 4 degree valgus cut.  The distal end of the femur was then amputated.  The spacer block was inserted and a 10 mm spacer gave excellent stability with medial and lateral collateral ligaments in both extension and flexion.  This was felt to be well-balanced.  A cutting block #3 was placed  over the distal end of the femur and held in position with fixation pins and the appropriate Chamfer cuts were  made.  This was then removed.  Attention was then  turned to the proximal tibia.  The tibia was displaced distally with a curved retractor.  The trial platform tower was put in place and the standard plus seemed to fit very nicely.  This was held in position with fixation pins.  The central  hole was made in the proximal end of the tibia.  The tower was then removed. All debris was removed.  Further posterior releases were accomplished.  The tibial trial was put in position and a 10 mm deep dish rotating platform insert was inserted.  The femoral component was placed over the distal end of the femur and the knee was articulated.  The knee was put through a full range  of motion and he knee was extremely stable in full flexion and full extension.  The posterior aspect of the patella was now exposed.  Using the cutting guide, the posterior aspect f the patella was amputated.  Three holes were placed on the posterior aspect of he patella using the guide and the patella trial was inserted.  The knee was put through a full range of motion and the patella tracked very nicely in the femoral groove.  Excellent realignment of the leg was achieved.  All of the components removed.  The pulsating lavage was used to remove all debris.  Throughout the procedure the knee was irrigated with copious amounts of antibiotic solution. lue was mixed and all the components were inserted sequentially starting with the tibia.  Excess glue was removed with small curets.  The tibia was inserted first. The deep dish platform was inserted and then glue placed over the distal end of the femur and the femoral component was inserted.  Glue was placed on the posterior  aspect of the patella.  The patella was inserted and held in place with fixation clamp.  All excess glue was then stripped away using a small elevator.  Once the glue had hardened up, the knee was put through a full range of motion and this as extremely stable to full flexion and full extension.  Some glue was noted on the lateral aspect of the femur and this was removed with small osteotomes.  The knee was irrigated again with copious amounts of antibiotic solution and the pulsating lavage to remove all debris.  The tourniquet was dropped.  Bleeders were coagulated.  The Hemovac drain was inserted and the long median parapatellar incision closed with heavy Ethibond sutures.  Vicryl was used to close the subcutaneous tissues and stainless steel staples used to close the skin. Sterile dressings were applied and the patient returned to the recovery room in excellent condition.  Technically, this went  extremely well.  COMPLICATIONS:  None. DD:  04/23/99 TD:  04/24/99 Job: [id number]

## 2010-07-19 NOTE — Op Note (Signed)
NAME:  Sandra Brennan, Sandra Brennan                         ACCOUNT NO.:  [id number]   MEDICAL RECORD NO.:  [id number]                   PATIENT TYPE:  INP   LOCATION:  2550                                 FACILITY:  [facility]   PHYSICIAN:  Lenard Galloway. Chaney Malling, M.D.           DATE OF BIRTH:  1945-03-08   DATE OF PROCEDURE:  05/31/2002  DATE OF DISCHARGE:                                 OPERATIVE REPORT   PREOPERATIVE DIAGNOSIS:  Severe osteoarthritis, right knee.   POSTOPERATIVE DIAGNOSIS:  Severe osteoarthritis, right knee.   PROCEDURE:  Total knee replacement on the right, all components glued in.  A  standard plus right femoral component with a standard plus 12 mm poly insert  and a standard plus patella with a size 3 keeled tibial tray.   ANESTHESIA:  General.   SURGEON:  Rodney A. Chaney Malling, M.D.   ASSISTANT:  Legrand Pitts. Duffy, P.A.   DESCRIPTION OF PROCEDURE:  The patient was placed on the operating table in  the supine position with the pneumatic tourniquet above the right upper  thigh.  The right lower extremity was prepped with Duraprep and draped out  in the usual manner.  An Esmarch was used to wrap about the leg, and the  tourniquet was elevated.  An incision made starting above the patella and  carried out to the tibial tubercle.  The skin edges were retracted.  Bleeders were coagulated.  Great care was taken to try to maintain good  hemostasis.  A long medial parapatellar incision was made, the patella was  everted laterally, and the contents of the knee were appraised.  The knee  was flexed and both medial and lateral meniscus were excised.  Tibia guide  #1 was placed through the anterior aspect of the tibia, placed at the  appropriate cutting level, fixed in place with fixation pins.  A power saw  was then used to amputate the proximal end of the tibia.  A nice cut was  achieved.  Several staples were seen at this level, and these staples were  removed.  The menisci were further  excised and the anterior and posterior  cruciate ligament were released off the posterior aspect of the femoral  notch.  Good access to the knee was achieved.  Femoral guide #1 was placed  through the anterior aspect of the femur after a knife had been placed over  the anterior aspect of the femur.  This was held in position, a drill hole  was made, and an intramedullary rod was inserted.  With the C spacer  inserted into the femoral guide and on the cut surface of the tibia, the  proper rotation was set up for the femoral guide.  This was locked in place  with fixation pins.  Anterior and posterior cuts were made using the capture  guide.  Femoral guide #2 was placed through the anterior aspect of the  femur, held  in place with intramedullary rod.  This was held flush to the  cut surface of the femur and locked in place with fixation pins.  The distal  end of the femur was then amputated and a spacer block was put in place  after the cutting block was removed.  Excellent stability of collateral  ligaments both in flexion and extension, this was well-balanced.  Femoral  guide #3 was placed over the distal end of the femur, locked in position and  chamfer cuts were made, drill holes were made on the distal end of the  femur.  The bone about the medial femoral condyle and medial tibial plateau  were quite hard.  The femoral guide #3 was then removed.  Using [place], the  tibia was subluxed anteriorly.  A size 3 trial was placed over the proximal  tibia.  This was fixed in place with fixation pins.  The tower was applied  and a large drill was used to drill a hole in the proximal tibia.  The keel  with wings was inserted and impacted.  The handle was removed.  A 10 mm  trial poly insert was inserted, and the trial femoral component was put over  the distal end of the femur.  There was a good range of motion.  Good  stability in flexion and extension and varus and valgus stress.  Attention  was  now turned to the posterior aspect of the patella.  Using the cutting  guide, the posterior aspect of the patella was amputated.  The guide for the  drill holes of the posterior aspect of the patella was locked in place, and  three drill holes were made.  The trial patella was inserted.  The knee was  put through a full range of motion.  The partial lateral release was done,  and excellent tracking and no tilting of the patella was seen.  A 12 mm  trial poly was then inserted, and this had a little more stability and was  felt to be the appropriate size.  All the components were removed.  The  pulse saline lavage was used.  All debris was removed.  Glue was mixed.  Each of the components were then inserted.  The tibial tray was inserted  first, excess glue was removed, and it was impacted.  The trial poly was  inserted and glue was inserted over the anterior aspect of the femur and  posterior runners of the femoral component, and the femoral component was  inserted.  Excess glue was removed.  This was impacted very tightly.  The  knee was then articulated and there was excellent range of motion and good  stability.  Glue was placed on the posterior aspect of the patella  prosthesis and was slipped in place and held in place with a patellar clamp.  Excess glue was also removed at this level.  Throughout the procedure the  knee was irrigated with copious amounts of antibiotic solution.  Once the  glue had hardened, a small osteotome was used to remove the excess glue.  The poly was removed, the tourniquet was dropped, and all bleeders were  coagulated.  Fairly good hemostasis was achieved.  The pulse saline lavage  was used again to remove all debris and small pieces of tissue, bone, and/or  glue.  The final tibial poly was then inserted and snapped into place.  The  knee was put through a full range of motion and there was excellent  stability and no AP drawer.  A Hemovac was then inserted and  the long median  parapatellar incision was closed with interrupted Tycron sutures.  Vicryl  was used to close the subcutaneous tissue and stainless steel staples used  to close the skin.  Technically this procedure went extremely well.  Drains:  Hemovac.  Complications:  None.                                               Rodney A. Chaney Malling, M.D.    RAM/MEDQ  D:  05/31/2002  T:  05/31/2002  Job:  [id number]

## 2010-07-19 NOTE — Discharge Summary (Signed)
NAME:  Sandra Brennan, Sandra Brennan                         ACCOUNT NO.:  [id number]   MEDICAL RECORD NO.:  [id number]                   Brennan TYPE:  INP   LOCATION:  5006                                 FACILITY:  [facility]   PHYSICIAN:  Lenard Galloway. Chaney Malling, M.D.           DATE OF BIRTH:  10-Dec-1945   DATE OF ADMISSION:  DATE OF DISCHARGE:  06/07/2002                                 DISCHARGE SUMMARY   ADMISSION DIAGNOSES:  1. End-stage osteoarthritis of the right knee.  2. Status post left knee replacement, February 2001.  3. Hypertension.  4. Asthma.  5. Obesity.  6. Gastroesophageal reflux disease.  7. Degenerative disk disease, lumbar spine.  8. History of myocardial infarction, 1992.   DISCHARGE DIAGNOSES:  1. End-stage osteoarthritis, right knee, status post right total knee     arthroplasty.  2. Acute blood loss anemia secondary to surgery.  3. Pyrexia and leukocytosis.  4. Urinary tract infection.  5. Hypertension.  6. Asthma.  7. Obesity.  8. Gastroesophageal reflux disease.  9. Degenerative disk disease, lumbar spine.  10.      History of myocardial infarction, 1992.   SURGICAL PROCEDURE:  On May 31, 2002, Sandra Brennan underwent a right total  knee arthroplasty by Dr. Rinaldo Ratel assisted by Arnoldo Morale, P.A.-C.  She has an LCS complete metal back patella cemented size standard plus with  an LCS complete primary femoral cemented size standard plus right femoral  component and a DePuy MBP __________ tibial tray cemented size 3 with an LCS  complete RP insert size standard plus 12.5 mm thickness.   COMPLICATIONS:  None.   CONSULTS:  1. Case management and physical therapy consults on June 01, 2002.  2. Rehab medicine consultation June 06, 2002.   HISTORY OF PRESENT ILLNESS:  Sandra Brennan presents  to Dr. Chaney Malling with a history of left knee replacement by him in February  2001.  Her left knee did well but she is having increasing problems  with the  right knee.  The right knee pain is constant soreness which becomes sharp  and stabbing.  It is located diffusely about the joint with radiation  proximally into her thigh and distally into her calf.  The pain increases  with certain movements or with standing for a prolonged period of time and  decreases with elevation and massage.  She has failed conservative  treatment.  Because of that she is presenting for right knee replacement.   HOSPITAL COURSE:  Sandra Brennan tolerated her surgical procedure well without  immediate postoperative complications.  She was transferred to 5000.  Postoperative day #1 she had some mild calf tenderness and that was  monitored.  Leg was neurovascularly intact otherwise.  Dressing was  reapplied and she was started on therapy per protocol.   On postoperative day #2, T-max 102.5, vitals stable.  Right knee incision  well approximated with staples.  Positive  Homan's on the right.  A Doppler  was obtained to rule out a DVT and that was negative.  White count was  elevated at 16.1, hemoglobin 10.5, hematocrit 30.6.  Albuterol nebulizers  were started due to her history of asthma and respiratory status was  monitored.   On postoperative day #3, she was complaining of some urinary frequency and  preop UA was probably contaminated.  Aggressive pulmonary toilet was done  and the UA and culture and sensitivity were obtained.  She was started on  Tequin 400 mg p.o. daily for five days for probable UTI and white count was  monitored.  At that point it was 18.6.   Temperature seemed to come down over the next several days.  She responded  to the Tequin for the UTI.  She made slow progress with physical therapy and  it was felt she would benefit from rehab so a rehab consult was obtained.  On June 07, 2002, she was ready for transfer to the rehab and was  transferred to rehab at that time.   DISCHARGE INSTRUCTIONS:   DIET:  She can continue her  hospitalization with adjustments to be made per  the rehab physicians.   DISCHARGE MEDICATIONS:  She is to continue her current hospitalization  medications with adjustments to be made per the rehab physicians.   ACTIVITY:  She is to be out of bed, partial weightbearing, 50% or less on  the right leg with the use of a walker.  She is to continue PT and OT per  rehab protocol, then continue her   CPM.   WOUND CARE:  Please clean the wound with Betadine daily and apply a dry  dressing.  Staples can be removed with Steri-Strips with Benzoin applied on  June 14, 2002.  Please notify Dr. Chaney Malling if temperature greater than  101.5, chills, pain unrelieved by pain medications or foul-smelling drainage  from the wound.   FOLLOW UP:  She is to follow up with Dr. Chaney Malling in our office  approximately one week after her discharge from rehab.   LABORATORY DATA:  Lower extremity venous Dopplers done June 02, 2002 show no  obvious evidence of DVT, superficial thrombus or Baker's cyst noted  bilaterally but Sandra was a difficult study due to body habitus.  Chest x-ray  taken on June 04, 2002 showed stable cardiomegaly with low lung volumes, no  edema, infiltrate, or pleural effusions.  On March 31, white count was 12.3,  hemoglobin 10.7, hematocrit 31.9. On April 1, white count was 16.1,  hemoglobin 10.5, hematocrit 30.6.  On April 2, white count 18.6, hemoglobin  10.2, hematocrit 30.1.  On April 4, white count was 13.6, hemoglobin 9.8,  hematocrit 29.2.  On April 5, white 11.8, hemoglobin 9.3, hematocrit 27.7.  On March 26, PTT 50.  On March 30 it was 56.  On March 31, glucose 154. On  April 1, sodium 131, potassium 3.6, glucose 152, BUN 5, creatinine 0.9.  On  April 4, sodium 129, potassium 6.8, chloride 90.  On April 5, sodium 131,  potassium 3.6, chloride 92, glucose 113.  Urinalysis on March 26 showed  large leukocyte esterase but many epithelials, 11-20 white cells, no red cells and  too-numerous-to-count  bacteria.  Repeat UA done on April 2 showed 30 mcg/dl of protein, negative  nitrite and leukocyte esterase.  No red cells, no bacteria, no white cells.  Urine culture showed no growth.     Legrand Pitts Duffy, P.A.  Rodney A. Chaney Malling, M.D.    KED/MEDQ  D:  07/27/2002  T:  07/27/2002  Job:  [id number]   cc:   Eino Farber., M.D.  [address] [place]  [place] [address]  Fax: [phone]

## 2010-07-19 NOTE — Discharge Summary (Signed)
[place]. [facility]  Patient:    Sandra Brennan, Sandra Brennan                        MRN: [id number] Adm. Date:  [id number] Disc. Date: [id number] Attending:  Cornell Barman Dictator:   Arnoldo Morale, P.A.-C.                           Discharge Summary  ADMITTING DIAGNOSES: 1. Osteoarthritis of the left knee. 2. Hypertension. 3. Asthma. 4. Degenerative disk disease of the lumbar spine. 5. History of myocardial infarction. 6. Gastroesophageal reflux disease.  DISCHARGE DIAGNOSES: 1. Osteoarthritis of the left knee. 2. Hypertension. 3. Asthma. 4. Degenerative disk disease of the lumbar spine. 5. History of myocardial infarction. 6. Gastroesophageal reflux disease. 7. Post-hemorrhagic anemia.  SURGICAL HISTORY:  On April 23, 1999, Sandra Brennan underwent a left total knee  arthroplasty by Dr. Rinaldo Ratel.  COMPLICATIONS:  None.  CONSULTS: 1. Pharmacy consult for Coumadin therapy April 23, 1999, 2. Physical therapy and case management consult on April 24, 1999. 3. Rehab medicine consult on May 22, 1999. 4. Occupational therapy consult, April 25, 1999.  HISTORY OF PRESENT ILLNESS:  This 65 year old black female presented to Dr.Mortenson with a history of a left knee injury seven or eight years ago. She was treated at that time, underwent left knee arthroscopy in August, 1989, and id pretty well for several years.  Over the last five years, the pain has gotten progressively worse in the left knee and it is constant.  It is described as a throbbing aching pain which is very severe at times and is located in the medial aspect of the knee with radiation into the thigh and down to the foot.  It does pop and give way at times and she has fallen because of the pain.  Standing does cause increase in the pain especially going up or down stairs and p.o. anti- inflammatories are providing minimal relief.  ______ was ineffective and  the pain seems to be getting worse, so she is presenting for a left total knee arthroplasty.  HOSPITAL COURSE:  Sandra Brennan tolerated her surgical procedure without immediate  postoperative complications and was subsequently transferred to 4700.  On postoperative day #1, she was afebrile, vital signs stable, and her hemoglobin as 11.4, with hematocrit of 33.9.  Left knee dressing was intact, leg was neurovascularly intact.  She complained of mild heartburn.  She was started on T per protocol.  On postoperative day #2, T max was 100.9 and her vitals were stable. Hemoglobin was 10.3 with hematocrit of 30.5.  Left knee incision was well approximated with staples with minimal drainage.  Negative Homans sign.  Leg neurovascularly intact. She was switched to p.o. pain medications and continued on therapy.  She did well over the next several days.  She tolerated p.o. pain medications well and was doing well with therapy.  She did well enough to begin plans for discharge home versus rehab and plans were begun for that.  On April 29, 1999, she was ready for discharge home.  Her pain was well controlled with p.o. medications and her T max was 99.8.  Vital signs were stable.  Left knee incision was unchanged and she was ready for transfer home.  DISCHARGE INSTRUCTIONS: 1. She was to resume all prehospital medications and diet with the exception of the    Arthrotec. 2. She was given OxyContin  20 mg one tablet p.o. q.12h. and OxyIR one to two    tablets p.o. q.4h. p.r.n. for pain, in addition to Coumadin one tablet p.o. Marland Kitchend.    per pharmacy protocol and Benadryl 25 to 50 mg one tablet p.o. q.6h. p.r.n. or    itching, and Senokot-S two tablets p.o. q.h.s. p.r.n. constipation. 3. She is to be weightbearing as tolerated on the left leg with the use of the    walker. 4. She was to arrange for home health physical therapy and an RN for her    prothrombin times. 5. She is to follow up  with Dr. Chaney Malling in approximately 10 days and was to call    [phone] to set up that appointment. 6. She was to notify Dr. Chaney Malling of temperature greater than 101.5, chills, pain    unrelieved by pain medications, or foul, smelly drainage from the wound.  LABORATORY DATA:  Chest x-ray done April 19, 1999, showed no active disease.   On February 16, her hemoglobin was 13.5, with hematocrit of 39.5.  On February 1, her hemoglobin was 11.4, with hematocrit of 33.9.  On February 22, her hemoglobin was 10.3 with hematocrit of 30.5.  On February 23, her white count was 11.4 with a hemoglobin of 10.6, hematocrit of 31.6, and a platelet count of 237.  On February 16, her PT was 14.3 seconds with an INR of 1.2 and a PTT of 38.  On  February 26, her PT was 19.9 seconds with an INR of 2.3.  All other laboratory studies were within normal limits. DD:  05/29/99 TD:  05/29/99 Job: [id number]

## 2011-03-17 ENCOUNTER — Other Ambulatory Visit: Payer: Self-pay | Admitting: Gastroenterology

## 2011-03-17 DIAGNOSIS — Z1211 Encounter for screening for malignant neoplasm of colon: Secondary | ICD-10-CM | POA: Diagnosis not present

## 2011-03-17 DIAGNOSIS — K644 Residual hemorrhoidal skin tags: Secondary | ICD-10-CM | POA: Diagnosis not present

## 2011-03-17 DIAGNOSIS — K5730 Diverticulosis of large intestine without perforation or abscess without bleeding: Secondary | ICD-10-CM | POA: Diagnosis not present

## 2011-03-17 DIAGNOSIS — D126 Benign neoplasm of colon, unspecified: Secondary | ICD-10-CM | POA: Diagnosis not present

## 2011-05-06 DIAGNOSIS — I119 Hypertensive heart disease without heart failure: Secondary | ICD-10-CM | POA: Diagnosis not present

## 2011-05-06 DIAGNOSIS — E119 Type 2 diabetes mellitus without complications: Secondary | ICD-10-CM | POA: Diagnosis not present

## 2011-05-06 DIAGNOSIS — H60399 Other infective otitis externa, unspecified ear: Secondary | ICD-10-CM | POA: Diagnosis not present

## 2011-05-06 DIAGNOSIS — M1990 Unspecified osteoarthritis, unspecified site: Secondary | ICD-10-CM | POA: Diagnosis not present

## 2011-05-06 DIAGNOSIS — E7800 Pure hypercholesterolemia, unspecified: Secondary | ICD-10-CM | POA: Diagnosis not present

## 2011-05-06 DIAGNOSIS — IMO0001 Reserved for inherently not codable concepts without codable children: Secondary | ICD-10-CM | POA: Diagnosis not present

## 2011-05-06 DIAGNOSIS — D509 Iron deficiency anemia, unspecified: Secondary | ICD-10-CM | POA: Diagnosis not present

## 2011-07-07 DIAGNOSIS — M1610 Unilateral primary osteoarthritis, unspecified hip: Secondary | ICD-10-CM | POA: Diagnosis not present

## 2011-07-07 DIAGNOSIS — M19019 Primary osteoarthritis, unspecified shoulder: Secondary | ICD-10-CM | POA: Diagnosis not present

## 2011-07-08 DIAGNOSIS — Z1231 Encounter for screening mammogram for malignant neoplasm of breast: Secondary | ICD-10-CM | POA: Diagnosis not present

## 2011-07-17 DIAGNOSIS — H16109 Unspecified superficial keratitis, unspecified eye: Secondary | ICD-10-CM | POA: Diagnosis not present

## 2011-07-17 DIAGNOSIS — H16229 Keratoconjunctivitis sicca, not specified as Sjogren's, unspecified eye: Secondary | ICD-10-CM | POA: Diagnosis not present

## 2011-07-17 DIAGNOSIS — E1139 Type 2 diabetes mellitus with other diabetic ophthalmic complication: Secondary | ICD-10-CM | POA: Diagnosis not present

## 2011-08-08 DIAGNOSIS — H16229 Keratoconjunctivitis sicca, not specified as Sjogren's, unspecified eye: Secondary | ICD-10-CM | POA: Diagnosis not present

## 2011-08-08 DIAGNOSIS — E1139 Type 2 diabetes mellitus with other diabetic ophthalmic complication: Secondary | ICD-10-CM | POA: Diagnosis not present

## 2011-08-25 DIAGNOSIS — E119 Type 2 diabetes mellitus without complications: Secondary | ICD-10-CM | POA: Diagnosis not present

## 2011-08-25 DIAGNOSIS — M1990 Unspecified osteoarthritis, unspecified site: Secondary | ICD-10-CM | POA: Diagnosis not present

## 2011-08-25 DIAGNOSIS — IMO0001 Reserved for inherently not codable concepts without codable children: Secondary | ICD-10-CM | POA: Diagnosis not present

## 2011-08-25 DIAGNOSIS — M79609 Pain in unspecified limb: Secondary | ICD-10-CM | POA: Diagnosis not present

## 2011-09-24 ENCOUNTER — Encounter: Payer: Self-pay | Admitting: Obstetrics and Gynecology

## 2011-10-13 ENCOUNTER — Ambulatory Visit: Payer: Self-pay | Admitting: Obstetrics and Gynecology

## 2011-10-14 DIAGNOSIS — H16229 Keratoconjunctivitis sicca, not specified as Sjogren's, unspecified eye: Secondary | ICD-10-CM | POA: Diagnosis not present

## 2011-10-14 DIAGNOSIS — E1139 Type 2 diabetes mellitus with other diabetic ophthalmic complication: Secondary | ICD-10-CM | POA: Diagnosis not present

## 2011-10-14 DIAGNOSIS — H16109 Unspecified superficial keratitis, unspecified eye: Secondary | ICD-10-CM | POA: Diagnosis not present

## 2011-10-30 ENCOUNTER — Ambulatory Visit: Payer: Medicare Other | Admitting: Obstetrics and Gynecology

## 2011-11-07 ENCOUNTER — Emergency Department (HOSPITAL_COMMUNITY): Payer: Medicare Other

## 2011-11-07 ENCOUNTER — Encounter (HOSPITAL_COMMUNITY): Payer: Self-pay | Admitting: Family Medicine

## 2011-11-07 ENCOUNTER — Emergency Department (HOSPITAL_COMMUNITY)
Admission: EM | Admit: 2011-11-07 | Discharge: 2011-11-07 | Disposition: A | Discharge status: Home / Self Care | Payer: Medicare Other | Attending: Emergency Medicine | Admitting: Emergency Medicine

## 2011-11-07 DIAGNOSIS — M25559 Pain in unspecified hip: Secondary | ICD-10-CM | POA: Diagnosis not present

## 2011-11-07 DIAGNOSIS — M169 Osteoarthritis of hip, unspecified: Secondary | ICD-10-CM | POA: Diagnosis not present

## 2011-11-07 DIAGNOSIS — I10 Essential (primary) hypertension: Secondary | ICD-10-CM | POA: Diagnosis not present

## 2011-11-07 DIAGNOSIS — M1610 Unilateral primary osteoarthritis, unspecified hip: Secondary | ICD-10-CM | POA: Insufficient documentation

## 2011-11-07 DIAGNOSIS — E119 Type 2 diabetes mellitus without complications: Secondary | ICD-10-CM | POA: Diagnosis not present

## 2011-11-07 DIAGNOSIS — M25859 Other specified joint disorders, unspecified hip: Secondary | ICD-10-CM | POA: Diagnosis not present

## 2011-11-07 DIAGNOSIS — Z880 Allergy status to penicillin: Secondary | ICD-10-CM | POA: Insufficient documentation

## 2011-11-07 DIAGNOSIS — M129 Arthropathy, unspecified: Secondary | ICD-10-CM | POA: Insufficient documentation

## 2011-11-07 DIAGNOSIS — Z96659 Presence of unspecified artificial knee joint: Secondary | ICD-10-CM | POA: Insufficient documentation

## 2011-11-07 DIAGNOSIS — R609 Edema, unspecified: Secondary | ICD-10-CM | POA: Diagnosis not present

## 2011-11-07 DIAGNOSIS — J449 Chronic obstructive pulmonary disease, unspecified: Secondary | ICD-10-CM | POA: Insufficient documentation

## 2011-11-07 DIAGNOSIS — J4489 Other specified chronic obstructive pulmonary disease: Secondary | ICD-10-CM | POA: Insufficient documentation

## 2011-11-07 HISTORY — DX: Essential (primary) hypertension: I10

## 2011-11-07 HISTORY — DX: Unspecified osteoarthritis, unspecified site: M19.90

## 2011-11-07 MED ORDER — HYDROCODONE-ACETAMINOPHEN 5-325 MG PO TABS: 1.0000 | ORAL_TABLET | Freq: Three times a day (TID) | ORAL | Status: AC | PRN

## 2011-11-07 MED ORDER — HYDROCODONE-ACETAMINOPHEN 5-325 MG PO TABS
2.0000 | ORAL_TABLET | Freq: Once | ORAL | Status: AC
  Administered 2011-11-07: 2 via ORAL
  Filled 2011-11-07: qty 2

## 2011-11-07 NOTE — ED Provider Notes (Signed)
History   This chart was scribed for Gerhard Munch, MD by Melba Coon. The patient was seen in room TR08C/TR08C and the patient's care was started at 5:08PM.    CSN: [id number]  Arrival date & time 11/07/11  1415   First MD Initiated Contact with Patient 11/07/11 1532      Chief Complaint  Patient presents with  . Leg Pain    (Consider location/radiation/quality/duration/timing/severity/associated sxs/prior treatment) The history is provided by the patient. No language interpreter was used.   Sandra Brennan is a 66 y.o. female who presents to the Emergency Department complaining of constant, moderate to severe left leg pain that radiates from the groin area down the leg that has been getting progressively worse in the last week. Pt has had chronic leg pain and was told it was arthritis by Dr. Cleophas Dunker; prior Hx of arthritis in other right ankle. Pt states that since onset, the pain has worsened and does not feel like her usual arthritis. Pt states that sometimes "her leg turns dark". Ambulation with pain and decreased compared to baseline. No HA, fever, neck pain, sore throat, rash, back pain, CP, SOB, abd pain, n/v/d, dysuria, or extremity edema, weakness, numbness, or tingling. No known allergies. Hx of left knee replacement. No Hx of blood clots or cellulitis. No other pertinent medical symptoms.  PCP: Dr. Petra Kuba  Past Medical History  Diagnosis Date  . Hypertension   . Diabetes mellitus   . Arthritis   COPD  History reviewed. No pertinent past surgical history.  No family history on file.  History  Substance Use Topics  . Smoking status: Never Smoker   . Smokeless tobacco: Not on file  . Alcohol Use: No    OB History    Grav Para Term Preterm Abortions TAB SAB Ect Mult Living                  Review of Systems 10 Systems reviewed and all are negative for acute change except as noted in the HPI.   Allergies  Penicillins and Theophyllines  Home  Medications   Current Outpatient Rx  Name Route Sig Dispense Refill  . ASPIRIN EC 81 MG PO TBEC Oral Take 81 mg by mouth daily.    Marland Kitchen CLOTRIMAZOLE-BETAMETHASONE 1-0.05 % EX CREA Topical Apply 1 application topically daily.    . CYCLOSPORINE 0.05 % OP EMUL Both Eyes Place 1 drop into both eyes 2 (two) times daily.    Marland Kitchen METFORMIN HCL ER (MOD) 500 MG PO TB24 Oral Take 500 mg by mouth daily with breakfast.    . TRIAMTERENE-HCTZ 37.5-25 MG PO TABS Oral Take 1 tablet by mouth daily.      BP 139/68  Pulse 90  Temp 98.8 F (37.1 C) (Oral)  Resp 20  SpO2 98%  Physical Exam  Nursing note and vitals reviewed. Constitutional: She is oriented to person, place, and time. She appears well-developed and well-nourished.  HENT:  Head: Normocephalic and atraumatic.  Eyes: EOM are normal. Pupils are equal, round, and reactive to light.  Neck: Normal range of motion. Neck supple.  Cardiovascular: Normal rate, normal heart sounds and intact distal pulses.   Pulmonary/Chest: Effort normal and breath sounds normal.  Abdominal: Bowel sounds are normal. She exhibits no distension. There is no tenderness.  Musculoskeletal: Normal range of motion. She exhibits edema (Left leg is diffuse but no superficial changes). She exhibits no tenderness.       Can flex hip but is hesitant to  do so. No deformity.  Neurological: She is alert and oriented to person, place, and time. She has normal strength. No cranial nerve deficit or sensory deficit.  Skin: Skin is warm and dry. No rash noted.  Psychiatric: She has a normal mood and affect.    ED Course  Procedures (including critical care time)  DIAGNOSTIC STUDIES: Oxygen Saturation is 98% on room air, normal by my interpretation.    COORDINATION OF CARE:  5:10PM - norco/vicodin and left hip XR will be ordered for the pt. 6:12PM - imaging reviewed; imaging shows arthritis in the left hip. Pt is advised to f/u with her orthopedist, Dr. Cleophas Dunker. Pt ready for  d/c.   Labs Reviewed - No data to display Dg Hip Complete Left  11/07/2011  *RADIOLOGY REPORT*  Clinical Data: Left hip pain.  LEFT HIP - COMPLETE 2+ VIEW  Comparison: None  Findings: There are advanced left hip joint degenerative changes with joint space narrowing, osteophytic spurring, subchondral cystic change and bony eburnation.  The right hip demonstrates mild degenerative changes.  No findings for acute fracture or avascular necrosis.  The pubic symphysis and SI joints are intact.  No pelvic fractures.  IMPRESSION: Advanced osteoarthritic type degenerative changes involving the left hip.   Original Report Authenticated By: P. Loralie Champagne, M.D.      No diagnosis found.    MDM  I personally performed the services described in this documentation, which was scribed in my presence. The recorded information has been reviewed and considered.  This elderly female presents with new left hip pain.  The patient's denial of any preceding trauma, falls, particularly strenuous events, and the acute on chronic description is suggestive of arthritic changes.  Patient has no risk factors for DVT, and no appreciable overlying changes, nor any tachycardia, hypoxia, tachypnea suggestive of ongoing thromboembolic processes.  The patient's x-ray demonstrates advanced arthritic changes of the hip and the patient achieved significant analgesia with oral medications.  We discussed the need for ongoing outpatient followup with orthopedics and the patient was discharged in stable condition.  Gerhard Munch, MD 11/07/11 [phone]

## 2011-11-07 NOTE — ED Notes (Signed)
Pt st's she has been having pain in left leg from hip down to to lower leg x's 2 months.  Has been seen by ortho and dx with arthritis. Pt st's this does not feel like arthritis.  No known injury.

## 2011-11-07 NOTE — ED Notes (Signed)
Pt sts pain in her groin going down left leg. sts for a while. sts only feels better when she lies flat. sts pain meds not working.

## 2011-11-13 DIAGNOSIS — M1610 Unilateral primary osteoarthritis, unspecified hip: Secondary | ICD-10-CM | POA: Diagnosis not present

## 2011-11-13 DIAGNOSIS — M1710 Unilateral primary osteoarthritis, unspecified knee: Secondary | ICD-10-CM | POA: Diagnosis not present

## 2011-11-13 DIAGNOSIS — M25569 Pain in unspecified knee: Secondary | ICD-10-CM | POA: Diagnosis not present

## 2011-11-18 DIAGNOSIS — M169 Osteoarthritis of hip, unspecified: Secondary | ICD-10-CM | POA: Diagnosis not present

## 2011-11-18 DIAGNOSIS — M25559 Pain in unspecified hip: Secondary | ICD-10-CM | POA: Diagnosis not present

## 2011-11-25 ENCOUNTER — Encounter: Payer: Self-pay | Admitting: Obstetrics and Gynecology

## 2011-11-25 ENCOUNTER — Ambulatory Visit (INDEPENDENT_AMBULATORY_CARE_PROVIDER_SITE_OTHER): Payer: Medicare Other | Admitting: Obstetrics and Gynecology

## 2011-11-25 VITALS — BP 148/90 | Resp 18 | Ht 62.0 in | Wt 264.0 lb

## 2011-11-25 DIAGNOSIS — Z124 Encounter for screening for malignant neoplasm of cervix: Secondary | ICD-10-CM | POA: Diagnosis not present

## 2011-11-25 DIAGNOSIS — Z01419 Encounter for gynecological examination (general) (routine) without abnormal findings: Secondary | ICD-10-CM

## 2011-11-25 NOTE — Progress Notes (Signed)
Subjective:    Sandra Brennan is a 66 y.o. female No obstetric history on file. who presents for annual exam. The patient has difficulty sleeping. She has a known history of sleep apnea but her sleeping machine does not help. The patient has had a hysterectomy.  The following portions of the patient's history were reviewed and updated as appropriate: allergies, current medications, past family history, past medical history, past social history, past surgical history and problem list.  Review of Systems Pertinent items are noted in HPI. Gastrointestinal:No change in bowel habits, no abdominal pain, no rectal bleeding Genitourinary:negative for dysuria, frequency, hematuria, nocturia and urinary incontinence    Objective:     BP 148/90  Resp 18  Ht 5\' 2"  (1.575 m)  Wt 264 lb (119.75 kg)  BMI 48.29 kg/m2  Weight:  Wt Readings from Last 1 Encounters:  11/25/11 264 lb (119.75 kg)     BMI: Body mass index is 48.29 kg/(m^2). General Appearance: Alert, appropriate appearance for age. No acute distress HEENT: Grossly normal Neck / Thyroid: Supple, no masses, nodes or enlargement Lungs: clear to auscultation bilaterally Back: No CVA tenderness Breast Exam: No masses or nodes.No dimpling, nipple retraction or discharge. Cardiovascular: Regular rate and rhythm. S1, S2, no murmur Gastrointestinal: Soft, non-tender, no masses or organomegaly  ++++++++++++++++++++++++++++++++++++++++++++++++++++++++  Pelvic Exam: External genitalia: normal general appearance Vaginal: normal without tenderness, induration or masses, atrophic mucosa and relaxation noted Cervix: absent Adnexa: normal bimanual exam Uterus: absent Rectovaginal: normal rectal, no masses  ++++++++++++++++++++++++++++++++++++++++++++++++++++++++  Lymphatic Exam: Non-palpable nodes in neck, clavicular, axillary, or inguinal regions  Psychiatric: Alert and oriented, appropriate affect.@OBJECTIVEEN @      Assessment:    Normal  gyn exam Menopause   Overweight or obese: Yes  Pelvic relaxation: Yes  Menopausal symptoms: No. Severe: No.  Sleep apnea.  Trouble sleeping.   Plan:    Mammogram.   Follow-up:  for annual exam  The updated Pap smear screening guidelines were discussed with the patient. The patient requested that I obtain a Pap smear: No.  Kegel exercises discussed: Yes.  Proper diet and regular exercise were reviewed.  Annual mammograms recommended starting at age 58. Proper breast care was discussed.  Screening colonoscopy is recommended beginning at age 48.  Regular health maintenance was reviewed.  Sleep hygiene was discussed.  Adequate calcium and vitamin D intake was emphasized.  Leonard Schwartz M.D.   Regular Periods: no Mammogram: yes 06/2011 "WNL"  Monthly Breast Ex.: yes Exercise: yes  Tetanus < 10 years: no Seatbelts: no  NI. Bladder Functn.: no Abuse at home: no  Daily BM's: yes Stressful Work: no  Healthy Diet: yes Sigmoid-Colonoscopy: polyp was removed. Pt states it was done early part of the year.   Calcium: yes Medical problems this year: None per pt.   LAST ZOX:[id number]  Contraception: HYST  PCP: NO CHANGES   PMH: NO CHANGES   FMH: NO CHANGES   Last Bone Scan: N/A

## 2011-12-09 DIAGNOSIS — M1990 Unspecified osteoarthritis, unspecified site: Secondary | ICD-10-CM | POA: Diagnosis not present

## 2011-12-09 DIAGNOSIS — E7800 Pure hypercholesterolemia, unspecified: Secondary | ICD-10-CM | POA: Diagnosis not present

## 2011-12-09 DIAGNOSIS — L280 Lichen simplex chronicus: Secondary | ICD-10-CM | POA: Diagnosis not present

## 2011-12-09 DIAGNOSIS — I119 Hypertensive heart disease without heart failure: Secondary | ICD-10-CM | POA: Diagnosis not present

## 2011-12-09 DIAGNOSIS — Z79899 Other long term (current) drug therapy: Secondary | ICD-10-CM | POA: Diagnosis not present

## 2012-02-06 DIAGNOSIS — M25519 Pain in unspecified shoulder: Secondary | ICD-10-CM | POA: Diagnosis not present

## 2012-02-06 DIAGNOSIS — M25569 Pain in unspecified knee: Secondary | ICD-10-CM | POA: Diagnosis not present

## 2012-02-06 DIAGNOSIS — M169 Osteoarthritis of hip, unspecified: Secondary | ICD-10-CM | POA: Diagnosis not present

## 2012-02-24 ENCOUNTER — Emergency Department (HOSPITAL_COMMUNITY): Payer: Medicare Other

## 2012-02-24 ENCOUNTER — Emergency Department (HOSPITAL_COMMUNITY)
Admission: EM | Admit: 2012-02-24 | Discharge: 2012-02-24 | Disposition: A | Discharge status: Home / Self Care | Payer: Medicare Other | Attending: Emergency Medicine | Admitting: Emergency Medicine

## 2012-02-24 ENCOUNTER — Encounter (HOSPITAL_COMMUNITY): Payer: Self-pay | Admitting: *Deleted

## 2012-02-24 DIAGNOSIS — M19219 Secondary osteoarthritis, unspecified shoulder: Secondary | ICD-10-CM | POA: Diagnosis not present

## 2012-02-24 DIAGNOSIS — Z7982 Long term (current) use of aspirin: Secondary | ICD-10-CM | POA: Insufficient documentation

## 2012-02-24 DIAGNOSIS — I10 Essential (primary) hypertension: Secondary | ICD-10-CM | POA: Insufficient documentation

## 2012-02-24 DIAGNOSIS — E119 Type 2 diabetes mellitus without complications: Secondary | ICD-10-CM | POA: Diagnosis not present

## 2012-02-24 DIAGNOSIS — M19019 Primary osteoarthritis, unspecified shoulder: Secondary | ICD-10-CM | POA: Insufficient documentation

## 2012-02-24 DIAGNOSIS — M79609 Pain in unspecified limb: Secondary | ICD-10-CM | POA: Insufficient documentation

## 2012-02-24 DIAGNOSIS — Z79899 Other long term (current) drug therapy: Secondary | ICD-10-CM | POA: Insufficient documentation

## 2012-02-24 DIAGNOSIS — M2540 Effusion, unspecified joint: Secondary | ICD-10-CM | POA: Insufficient documentation

## 2012-02-24 MED ORDER — PREDNISONE 20 MG PO TABS: ORAL_TABLET | ORAL | Status: DC

## 2012-02-24 MED ORDER — KETOROLAC TROMETHAMINE 30 MG/ML IJ SOLN
30.0000 mg | Freq: Once | INTRAMUSCULAR | Status: AC
  Administered 2012-02-24: 30 mg via INTRAMUSCULAR
  Filled 2012-02-24: qty 1

## 2012-02-24 MED ORDER — PREDNISONE 20 MG PO TABS
60.0000 mg | ORAL_TABLET | Freq: Once | ORAL | Status: AC
  Administered 2012-02-24: 60 mg via ORAL
  Filled 2012-02-24: qty 3

## 2012-02-24 NOTE — ED Notes (Signed)
Pt returned from xray

## 2012-02-24 NOTE — ED Provider Notes (Signed)
History     CSN: [id number]  Arrival date & time 02/24/12  2032   First MD Initiated Contact with Patient 02/24/12 2102      Chief Complaint  Patient presents with  . Arm Pain    (Consider location/radiation/quality/duration/timing/severity/associated sxs/prior treatment) HPI Comments: Patient history of arthritis, woke up today with excruciating pain in her right shoulder, which is worse than normal, not controlled by the Vicodin, that she's been taking.  She is followed by an orthopedic physician, who is considering her for hip replacement surgery due to the advanced arthritis in her hip she denies any falls or trauma  Patient is a 66 y.o. female presenting with arm pain. The history is provided by the patient.  Arm Pain This is a recurrent problem. The current episode started today. Associated symptoms include joint swelling. Pertinent negatives include no fever, numbness or weakness.    Past Medical History  Diagnosis Date  . Hypertension   . Diabetes mellitus   . Arthritis     Past Surgical History  Procedure Date  . Vaginal hysterectomy   . Joint replacement     left and right knee replacement    Family History  Problem Relation Age of Onset  . Arthritis Mother   . Arthritis Father   . Diabetes Sister   . Arthritis Sister   . Diabetes Maternal Grandmother   . Arthritis Maternal Grandmother   . Diabetes Maternal Grandfather   . Arthritis Maternal Grandfather     History  Substance Use Topics  . Smoking status: Never Smoker   . Smokeless tobacco: Never Used  . Alcohol Use: No    OB History    Grav Para Term Preterm Abortions TAB SAB Ect Mult Living                  Review of Systems  Constitutional: Negative for fever and activity change.  HENT: Negative.   Respiratory: Negative.   Cardiovascular: Negative.   Musculoskeletal: Positive for joint swelling.  Neurological: Negative for dizziness, weakness and numbness.    Allergies  Penicillins;  Sulfa antibiotics; and Theophyllines  Home Medications   Current Outpatient Rx  Name  Route  Sig  Dispense  Refill  . TYLENOL ARTHRITIS PAIN PO   Oral   Take 2 tablets by mouth 2 (two) times daily.         . ASPIRIN EC 81 MG PO TBEC   Oral   Take 81 mg by mouth daily.         Marland Kitchen CLOTRIMAZOLE-BETAMETHASONE 1-0.05 % EX CREA   Topical   Apply 1 application topically daily.         Marland Kitchen HYDROCODONE-ACETAMINOPHEN 5-325 MG PO TABS   Oral   Take 1 tablet by mouth every 6 (six) hours as needed. For pain.         Marland Kitchen METFORMIN HCL ER (MOD) 500 MG PO TB24   Oral   Take 500 mg by mouth daily with breakfast.         . POTASSIUM CHLORIDE CRYS ER 20 MEQ PO TBCR   Oral   Take 20 mEq by mouth 2 (two) times daily.         . TRIAMTERENE-HCTZ 37.5-25 MG PO TABS   Oral   Take 1 tablet by mouth daily.         . CYCLOSPORINE 0.05 % OP EMUL   Both Eyes   Place 1 drop into both eyes 2 (two) times daily.         Marland Kitchen  PREDNISONE 20 MG PO TABS      3 Tabs PO Days 1-3, then 2 tabs PO Days 4-6, then 1 tab PO Day 7-9, then Half Tab PO Day 10-12   20 tablet   0     BP 167/77  Pulse 86  Temp 98.1 F (36.7 C) (Oral)  Resp 16  SpO2 100%  Physical Exam  Constitutional: She appears well-developed and well-nourished.       Morbidly obese  HENT:  Head: Normocephalic.  Eyes: Pupils are equal, round, and reactive to light.  Neck: Normal range of motion.  Cardiovascular: Normal rate.   Pulmonary/Chest: Effort normal.  Musculoskeletal: She exhibits tenderness. She exhibits no edema.  Neurological: She is alert.  Skin: Skin is warm.    ED Course  Procedures (including critical care time)  Labs Reviewed - No data to display Dg Shoulder Right  02/24/2012  *RADIOLOGY REPORT*  Clinical Data: Right shoulder pain, history bursitis, no known injury  RIGHT SHOULDER - 2+ VIEW  Comparison: None  Findings: Osseous demineralization. AC joint alignment normal with inferior acromial spur  formation noted. Advanced right glenohumeral degenerative changes with joint space loss and spur formation as well as minimal subchondral cystic change. No acute fracture, dislocation, or bone destruction. Visualized right ribs intact.  IMPRESSION: Advanced osteoarthritic changes of the right shoulder. Inferior acromial spur formation, which may predispose the patient to rotator cuff pathology. Osseous demineralization.   Original Report Authenticated By: Ulyses Southward, M.D.      1. Localized, secondary osteoarthritis of the shoulder region       MDM   Physical exam limited by body habitus.  X-ray reveals advanced arthritis in the shoulder joint        Arman Filter, NP 02/24/12 2216

## 2012-02-24 NOTE — ED Notes (Signed)
Patient transported to X-ray 

## 2012-02-24 NOTE — ED Notes (Addendum)
Pt states 07:00 on Monday her right arm pain woke her up out of sleep. Pt states that the pain moves from her shoulder down her arm and hurts when she moves her fingers, arm or shoulder. Pt has been taking vicoden every 4-5 hours for pain with no relief. Pt denies injury or known cause.

## 2012-02-24 NOTE — ED Notes (Addendum)
Pt tearful at bedside. C/o right arm pain. Pt states she cannot move arm without severe pain. Pt states arm hurts from shoulder to fingers. No injury, no swelling/deformity. Pulse present.

## 2012-02-25 NOTE — ED Provider Notes (Signed)
Medical screening examination/treatment/procedure(s) were performed by non-physician practitioner and as supervising physician I was immediately available for consultation/collaboration.  John-Adam Jazper Nikolai, M.D.     John-Adam Deondrea Markos, MD 02/25/12 0033 

## 2012-03-23 DIAGNOSIS — L280 Lichen simplex chronicus: Secondary | ICD-10-CM | POA: Diagnosis not present

## 2012-03-23 DIAGNOSIS — I119 Hypertensive heart disease without heart failure: Secondary | ICD-10-CM | POA: Diagnosis not present

## 2012-03-23 DIAGNOSIS — E119 Type 2 diabetes mellitus without complications: Secondary | ICD-10-CM | POA: Diagnosis not present

## 2012-03-23 DIAGNOSIS — M2550 Pain in unspecified joint: Secondary | ICD-10-CM | POA: Diagnosis not present

## 2012-04-04 ENCOUNTER — Emergency Department (HOSPITAL_COMMUNITY): Payer: Medicare Other

## 2012-04-04 ENCOUNTER — Emergency Department (HOSPITAL_COMMUNITY)
Admission: EM | Admit: 2012-04-04 | Discharge: 2012-04-04 | Disposition: A | Discharge status: Home / Self Care | Payer: Medicare Other | Attending: Emergency Medicine | Admitting: Emergency Medicine

## 2012-04-04 ENCOUNTER — Encounter (HOSPITAL_COMMUNITY): Payer: Self-pay | Admitting: Emergency Medicine

## 2012-04-04 DIAGNOSIS — I10 Essential (primary) hypertension: Secondary | ICD-10-CM | POA: Insufficient documentation

## 2012-04-04 DIAGNOSIS — S79919A Unspecified injury of unspecified hip, initial encounter: Secondary | ICD-10-CM | POA: Insufficient documentation

## 2012-04-04 DIAGNOSIS — G8929 Other chronic pain: Secondary | ICD-10-CM | POA: Diagnosis not present

## 2012-04-04 DIAGNOSIS — W010XXA Fall on same level from slipping, tripping and stumbling without subsequent striking against object, initial encounter: Secondary | ICD-10-CM | POA: Insufficient documentation

## 2012-04-04 DIAGNOSIS — M129 Arthropathy, unspecified: Secondary | ICD-10-CM | POA: Insufficient documentation

## 2012-04-04 DIAGNOSIS — S99919A Unspecified injury of unspecified ankle, initial encounter: Secondary | ICD-10-CM | POA: Diagnosis not present

## 2012-04-04 DIAGNOSIS — Z79899 Other long term (current) drug therapy: Secondary | ICD-10-CM | POA: Diagnosis not present

## 2012-04-04 DIAGNOSIS — Z7982 Long term (current) use of aspirin: Secondary | ICD-10-CM | POA: Diagnosis not present

## 2012-04-04 DIAGNOSIS — S8990XA Unspecified injury of unspecified lower leg, initial encounter: Secondary | ICD-10-CM | POA: Diagnosis not present

## 2012-04-04 DIAGNOSIS — M25569 Pain in unspecified knee: Secondary | ICD-10-CM | POA: Diagnosis not present

## 2012-04-04 DIAGNOSIS — E119 Type 2 diabetes mellitus without complications: Secondary | ICD-10-CM | POA: Insufficient documentation

## 2012-04-04 DIAGNOSIS — M25559 Pain in unspecified hip: Secondary | ICD-10-CM | POA: Diagnosis not present

## 2012-04-04 DIAGNOSIS — Y929 Unspecified place or not applicable: Secondary | ICD-10-CM | POA: Insufficient documentation

## 2012-04-04 DIAGNOSIS — Y939 Activity, unspecified: Secondary | ICD-10-CM | POA: Insufficient documentation

## 2012-04-04 DIAGNOSIS — S79929A Unspecified injury of unspecified thigh, initial encounter: Secondary | ICD-10-CM | POA: Diagnosis not present

## 2012-04-04 DIAGNOSIS — S99929A Unspecified injury of unspecified foot, initial encounter: Secondary | ICD-10-CM | POA: Diagnosis not present

## 2012-04-04 MED ORDER — MORPHINE SULFATE 4 MG/ML IJ SOLN
6.0000 mg | Freq: Once | INTRAMUSCULAR | Status: AC
  Administered 2012-04-04: 6 mg via INTRAMUSCULAR
  Filled 2012-04-04 (×2): qty 1

## 2012-04-04 MED ORDER — ONDANSETRON 4 MG PO TBDP
4.0000 mg | ORAL_TABLET | Freq: Once | ORAL | Status: AC
  Administered 2012-04-04: 4 mg via ORAL
  Filled 2012-04-04: qty 1

## 2012-04-04 MED ORDER — HYDROCODONE-ACETAMINOPHEN 5-325 MG PO TABS: 2.0000 | ORAL_TABLET | Freq: Four times a day (QID) | ORAL | Status: DC | PRN

## 2012-04-04 NOTE — ED Notes (Signed)
Onset 3 days ago slipped and fell on ice and states left hip and left lower extremity pain.  Pain was present prior to fall suppose to have left hip replaced in the future.  Pain worsening over time.  Pain currently 9/10 achy sharp.

## 2012-04-04 NOTE — ED Provider Notes (Signed)
History    This chart was scribed for Sandra Emery PA-C, a non-physician practitioner working with Doug Sou, MD by Lewanda Rife, ED Scribe. This patient was seen in room TR08C/TR08C and the patient's care was started at 6:00pm.    CSN: [id number]  Arrival date & time 04/04/12  1731   First MD Initiated Contact with Patient 04/04/12 1743      Chief Complaint  Patient presents with  . Leg Pain  . Hip Pain    (Consider location/radiation/quality/duration/timing/severity/associated sxs/prior treatment) HPI Sandra Brennan is a 67 y.o. female who presents to the Emergency Department complaining of constant severe left hip radiating down left leg onset 3 days. Pt reports she tripped over a piece of wood on 3 days ago. Pt reports she is able to walk, but with a limp. Pt reports the pain 10/10 in severity. Pt reports she tried managing pain at home with prescribed pain medications with no relief. Pt reports history of arthritis, and chronic left knee pain.   Dr. Cleophas Dunker- Orthopedist   Past Medical History  Diagnosis Date  . Hypertension   . Diabetes mellitus   . Arthritis     Past Surgical History  Procedure Date  . Vaginal hysterectomy   . Joint replacement     left and right knee replacement    Family History  Problem Relation Age of Onset  . Arthritis Mother   . Arthritis Father   . Diabetes Sister   . Arthritis Sister   . Diabetes Maternal Grandmother   . Arthritis Maternal Grandmother   . Diabetes Maternal Grandfather   . Arthritis Maternal Grandfather     History  Substance Use Topics  . Smoking status: Never Smoker   . Smokeless tobacco: Never Used  . Alcohol Use: No    OB History    Grav Para Term Preterm Abortions TAB SAB Ect Mult Living                  Review of Systems  Constitutional: Negative.   HENT: Negative.   Respiratory: Negative.   Cardiovascular: Negative.   Gastrointestinal: Negative.   Musculoskeletal: Positive for  myalgias and arthralgias.  Skin: Negative.   Neurological: Negative.   Hematological: Negative.   Psychiatric/Behavioral: Negative.   All other systems reviewed and are negative.  A complete 10 system review of systems was obtained and all systems are negative except as noted in the HPI and PMH.    Allergies  Penicillins; Sulfa antibiotics; and Theophyllines  Home Medications   Current Outpatient Rx  Name  Route  Sig  Dispense  Refill  . TYLENOL ARTHRITIS PAIN PO   Oral   Take 2 tablets by mouth 2 (two) times daily.         . ASPIRIN EC 81 MG PO TBEC   Oral   Take 81 mg by mouth daily.         Marland Kitchen CLOTRIMAZOLE-BETAMETHASONE 1-0.05 % EX CREA   Topical   Apply 1 application topically daily.         . CYCLOSPORINE 0.05 % OP EMUL   Both Eyes   Place 1 drop into both eyes 2 (two) times daily.         Marland Kitchen HYDROCODONE-ACETAMINOPHEN 5-325 MG PO TABS   Oral   Take 1 tablet by mouth every 6 (six) hours as needed. For pain.         Marland Kitchen METFORMIN HCL ER (MOD) 500 MG PO TB24   Oral  Take 500 mg by mouth daily with breakfast.         . POTASSIUM CHLORIDE CRYS ER 20 MEQ PO TBCR   Oral   Take 20 mEq by mouth 2 (two) times daily.         Marland Kitchen PREDNISONE 20 MG PO TABS      3 Tabs PO Days 1-3, then 2 tabs PO Days 4-6, then 1 tab PO Day 7-9, then Half Tab PO Day 10-12   20 tablet   0   . TRIAMTERENE-HCTZ 37.5-25 MG PO TABS   Oral   Take 1 tablet by mouth daily.           BP 147/71  Pulse 72  Temp 98.6 F (37 C) (Oral)  Resp 20  SpO2 100%  Physical Exam  Nursing note and vitals reviewed. Constitutional: She is oriented to person, place, and time. She appears well-developed and well-nourished. No distress.       Morbidly obese  HENT:  Head: Normocephalic.  Eyes: Conjunctivae normal and EOM are normal.  Cardiovascular: Normal rate.   Pulmonary/Chest: Effort normal. No stridor.  Musculoskeletal: Normal range of motion.       Exquisite tenderness to palpation  of left trochanter and normal dorsalis pedis  Neurological: She is alert and oriented to person, place, and time.  Skin: Skin is warm.  Psychiatric: She has a normal mood and affect.    ED Course  Procedures (including critical care time)  Labs Reviewed - No data to display Dg Hip Complete Left  04/04/2012  *RADIOLOGY REPORT*  Clinical Data: Post fall, now with left hip pain  LEFT HIP - COMPLETE 2+ VIEW  Comparison: 11/07/2011  Findings:  No definite fracture or dislocation.  Severe degenerative changes of the left hip with joint space loss, subchondral sclerosis and cyst/geode formation, osteophytosis and minimal axial migration.  No definite pelvic fracture.  Regional soft tissues are normal. Several phleboliths overlie the pelvis.  IMPRESSION: 1.  No acute findings. 2.  Severe degenerative change of the left hip.   Original Report Authenticated By: Tacey Ruiz, MD    Dg Knee Complete 4 Views Left  04/04/2012  *RADIOLOGY REPORT*  Clinical Data: Post trip and fall 3 days ago, now with left knee pain  LEFT KNEE - COMPLETE 4+ VIEW  Comparison: None.  Findings:  Post left total knee replacement without definite evidence of hardware failure or loosening.  No fracture or dislocation.  No suprapatellar joint effusion.  There is minimal enthesopathic change of the superior pole of the patella.  Regional soft tissues are normal.  IMPRESSION: 1.  No acute findings. 2.  Post left total knee replacement without definite evidence of hardware failure or loosening.   Original Report Authenticated By: Tacey Ruiz, MD      1. Arthralgia of hip       MDM   No fracture seen on x-rays. This is likely just an exacerbation of her very severe and chronic arthritis. We'll increase her pain medication is currently taking 5 mg Vicodin every 8 hours. I advised her that she can take 1-2 tabs every 6 hours as needed. We have had extensive discussions on fall precautions.   Filed Vitals:   04/04/12 1735  BP: 147/71   Pulse: 72  Temp: 98.6 F (37 C)  TempSrc: Oral  Resp: 20  SpO2: 100%     Pt verbalized understanding and agrees with care plan. Outpatient follow-up and return precautions given.  I personally performed the services described in this documentation, which was scribed in my presence. The recorded information has been reviewed and is accurate.   Sandra Emery, PA-C 04/05/12 1427

## 2012-04-06 NOTE — ED Provider Notes (Signed)
Medical screening examination/treatment/procedure(s) were performed by non-physician practitioner and as supervising physician I was immediately available for consultation/collaboration.  Doug Sou, MD 04/06/12 [phone]

## 2012-04-20 DIAGNOSIS — E119 Type 2 diabetes mellitus without complications: Secondary | ICD-10-CM | POA: Diagnosis not present

## 2012-04-20 DIAGNOSIS — H04129 Dry eye syndrome of unspecified lacrimal gland: Secondary | ICD-10-CM | POA: Diagnosis not present

## 2012-05-28 DIAGNOSIS — Z96659 Presence of unspecified artificial knee joint: Secondary | ICD-10-CM | POA: Diagnosis not present

## 2012-05-28 DIAGNOSIS — M169 Osteoarthritis of hip, unspecified: Secondary | ICD-10-CM | POA: Diagnosis not present

## 2012-05-28 DIAGNOSIS — M25569 Pain in unspecified knee: Secondary | ICD-10-CM | POA: Diagnosis not present

## 2012-05-28 DIAGNOSIS — M25559 Pain in unspecified hip: Secondary | ICD-10-CM | POA: Diagnosis not present

## 2012-06-01 ENCOUNTER — Other Ambulatory Visit (HOSPITAL_COMMUNITY): Payer: Self-pay | Admitting: Orthopaedic Surgery

## 2012-06-01 DIAGNOSIS — M25562 Pain in left knee: Secondary | ICD-10-CM

## 2012-06-07 ENCOUNTER — Encounter (HOSPITAL_COMMUNITY)
Admission: RE | Admit: 2012-06-07 | Discharge: 2012-06-07 | Disposition: A | Discharge status: Home / Self Care | Payer: Medicare Other | Source: Ambulatory Visit | Attending: Orthopaedic Surgery | Admitting: Orthopaedic Surgery

## 2012-06-07 ENCOUNTER — Ambulatory Visit (HOSPITAL_COMMUNITY)
Admission: RE | Admit: 2012-06-07 | Discharge: 2012-06-07 | Disposition: A | Discharge status: Home / Self Care | Payer: Medicare Other | Source: Ambulatory Visit | Attending: Orthopaedic Surgery | Admitting: Orthopaedic Surgery

## 2012-06-07 DIAGNOSIS — M25569 Pain in unspecified knee: Secondary | ICD-10-CM | POA: Insufficient documentation

## 2012-06-07 DIAGNOSIS — Z96659 Presence of unspecified artificial knee joint: Secondary | ICD-10-CM | POA: Diagnosis not present

## 2012-06-07 DIAGNOSIS — M25562 Pain in left knee: Secondary | ICD-10-CM

## 2012-06-07 MED ORDER — TECHNETIUM TC 99M MEDRONATE IV KIT
25.0000 | PACK | Freq: Once | INTRAVENOUS | Status: AC | PRN
  Administered 2012-06-07: 25 via INTRAVENOUS

## 2012-06-08 DIAGNOSIS — M25569 Pain in unspecified knee: Secondary | ICD-10-CM | POA: Diagnosis not present

## 2012-06-08 DIAGNOSIS — M2550 Pain in unspecified joint: Secondary | ICD-10-CM | POA: Diagnosis not present

## 2012-06-08 DIAGNOSIS — M1710 Unilateral primary osteoarthritis, unspecified knee: Secondary | ICD-10-CM | POA: Diagnosis not present

## 2012-06-22 DIAGNOSIS — I119 Hypertensive heart disease without heart failure: Secondary | ICD-10-CM | POA: Diagnosis not present

## 2012-06-22 DIAGNOSIS — J45902 Unspecified asthma with status asthmaticus: Secondary | ICD-10-CM | POA: Diagnosis not present

## 2012-06-22 DIAGNOSIS — J449 Chronic obstructive pulmonary disease, unspecified: Secondary | ICD-10-CM | POA: Diagnosis not present

## 2012-06-22 DIAGNOSIS — E559 Vitamin D deficiency, unspecified: Secondary | ICD-10-CM | POA: Diagnosis not present

## 2012-06-22 DIAGNOSIS — Z7901 Long term (current) use of anticoagulants: Secondary | ICD-10-CM | POA: Diagnosis not present

## 2012-06-22 DIAGNOSIS — E7800 Pure hypercholesterolemia, unspecified: Secondary | ICD-10-CM | POA: Diagnosis not present

## 2012-06-22 DIAGNOSIS — M2550 Pain in unspecified joint: Secondary | ICD-10-CM | POA: Diagnosis not present

## 2012-06-22 DIAGNOSIS — E119 Type 2 diabetes mellitus without complications: Secondary | ICD-10-CM | POA: Diagnosis not present

## 2012-07-22 NOTE — H&P (Signed)
CHIEF COMPLAINT:  Left groin pain.   HISTORY OF PRESENT ILLNESS:  Sandra Brennan is a very pleasant 67 year old widowed female who is seen today for evaluation of her left hip and groin pain. She has had bilateral joint replacements in the knees and there may be some question of whether there is loosening of the tibial plateaus in the knee component.  However, her biggest complaints were in the left groin and lateral aspect of her hip.  She was seen last in April of this year and she is now having to use a cane.  Her mobilization is limited and her activities of daily living have actually been quite compromised because of this pain and discomfort.  She has difficulty with sleeping.  She has had a corticosteroid injection to the hip in January which gave her several days of relief but was not long lasting.  She has tried gabapentin as well as narcotics.  She had been noted to have a x-rays of peripheral osteophytes.  She is feet 6 inches and weight is 236 pounds and has been trying to lose weight and continues to do so.  She at one point had weighed over 334 pounds.  She is seen today for a review of her hip and groin pain.   PAST MEDICAL HISTORY:  Good along with general health is good.    SURGICAL HISTORY:   1.  In 2001 a total knee arthroplasty. 2.  In 2004 right total knee arthroplasty. 3.  In 1997 for  a total abdominal hysterectomy. 4.  In 1994 for a cyst removal of the right wrist volar ganglion. 5.  In 1984 for MCL ligament injury to the right leg. 6.  Childbirth x5 1967, 1966, 1969, 1973, 1980.   MEDICATIONS:   1.  Diclofenac 75 mg b.i.d. 2.  Klor-Con 20 mEq b.i.d. 3.  Metformin 500 every morning. 4.  Triamterene/hydrochlorothiazide 37.5/25 daily. 5.  Aspirin 81 mg daily. 6.  Arthritis pain pills 650 mg 2 every 8 hours p.r.n. 7.  Hydrocodone 5/325 two every 6 hours p.r.n. 8.  Gabapentin 400 mg b.i.d.   ALLERGIES:  PENICILLIN AND SULFA WHICH GIVES HER A RASH AND THEOPHYLLINE MAKES HER  HYPER.   REVIEW OF SYSTEMS:  A 14-point review of systems reveals negative except for glasses.  She also has had hypertension for 10 years and she states it is borderline now with medicines.  She has had asthma all her life and anxiety triggers this.  She has had sleep apnea but now that she has lost weight, her symptoms are much less.   FAMILY HISTORY:  Reveals a mother who is still alive at 52 who has had a heart attack as well as gout and hypertension and stroke, also has had cancer.  Father is deceased at age 42 and he has had a heart attack also as well as hypertension.  She has 1 brother who is deceased from an aneurysm in a car wreck at age 45.  She has 3 living brothers at age 93, 86, 24.  Sisters living and is 67 years of age, 58 years of age and 67 years of age.   SOCIAL HISTORY:  She is a 67 year old female who is widowed and retired.  She quit smoking serious 30 years ago and prior to that she was smoking a 1/2 pack of cigarettes per day for 3 years.  She does not drink alcohol.   PHYSICAL EXAMINATION:  A very pleasant 67 year old white female, well-developed, well-nourished, very pleasant  and cooperative in moderate distress secondary to left groin and hip pain.  She is 5 feet 6 inches and weighs 232 pounds.  BMI is 37.4.    Vital signs reveals a temperature of 99.4, pulse 84, respirations 18, blood pressure 142/82. Head is normocephalic. Eyes:  Pupils equal, round, reactive to light and accommodation.  Extraocular movements intact. Ear, nose and throat were benign. Neck was supple. No thyromegaly nor any carotid bruits. Chest had good expansion. Lungs were essentially clear. Cardiac had a regular rate and rhythm. Normal S1, S2.  Distant heart sounds. Pulses were 1+ bilateral symmetric in lower extremities. Abdomen was obese, soft, nontender.  No masses palpable. Normal bowel sounds present. CNS:  She is oriented x3 and cranial nerves II-XII grossly intact. Musculoskeletal:  She  does have marked pain with internal and external rotation of the left hip.  She has good strength though in the lower extremity.  She has well-healed surgical incisions overlying both knees.   CLINICAL IMPRESSION:  OA of the left hip.   RECOMMENDATIONS:  At this time, I reviewed the clearances from Dr. Corine Shelter who feels that she is a candidate from a medical standpoint for surgery.  Therefore, the procedure, risks and benefits were fully explained to her and she is understanding. We will proceed with a left total hip arthroplasty in the very near future.  I spent 50 minutes with the patient explaining the procedure, risks and benefits as well as interviewing her.  All questions were answered in detail.  Oris Drone Aleda Grana [facility] [phone]  07/22/2012 3:55 PM

## 2012-07-25 ENCOUNTER — Emergency Department (HOSPITAL_COMMUNITY)
Admission: EM | Admit: 2012-07-25 | Discharge: 2012-07-25 | Disposition: A | Discharge status: Home / Self Care | Payer: Medicare Other | Attending: Emergency Medicine | Admitting: Emergency Medicine

## 2012-07-25 ENCOUNTER — Emergency Department (HOSPITAL_COMMUNITY): Payer: Medicare Other

## 2012-07-25 ENCOUNTER — Encounter (HOSPITAL_COMMUNITY): Payer: Self-pay | Admitting: Nurse Practitioner

## 2012-07-25 DIAGNOSIS — M25571 Pain in right ankle and joints of right foot: Secondary | ICD-10-CM

## 2012-07-25 DIAGNOSIS — I10 Essential (primary) hypertension: Secondary | ICD-10-CM | POA: Diagnosis not present

## 2012-07-25 DIAGNOSIS — Z79899 Other long term (current) drug therapy: Secondary | ICD-10-CM | POA: Diagnosis not present

## 2012-07-25 DIAGNOSIS — M25579 Pain in unspecified ankle and joints of unspecified foot: Secondary | ICD-10-CM | POA: Diagnosis not present

## 2012-07-25 DIAGNOSIS — Z880 Allergy status to penicillin: Secondary | ICD-10-CM | POA: Diagnosis not present

## 2012-07-25 DIAGNOSIS — E119 Type 2 diabetes mellitus without complications: Secondary | ICD-10-CM | POA: Diagnosis not present

## 2012-07-25 DIAGNOSIS — M19079 Primary osteoarthritis, unspecified ankle and foot: Secondary | ICD-10-CM | POA: Diagnosis not present

## 2012-07-25 NOTE — ED Provider Notes (Signed)
History    This chart was scribed for Trixie Dredge PA-C, a non-physician practitioner working with Donnetta Hutching, MD by Lewanda Rife, ED Scribe. This patient was seen in room TR09C/TR09C and the patient's care was started at 1932.     CSN: [id number]  Arrival date & time 07/25/12  1759   First MD Initiated Contact with Patient 07/25/12 1832      Chief Complaint  Patient presents with  . Ankle Pain    (Consider location/radiation/quality/duration/timing/severity/associated sxs/prior treatment) The history is provided by the patient.   HPI Comments: Sandra Brennan is a 67 y.o. female who presents to the Emergency Department complaining of exacerbation of chronic arthritic right ankle pain at 4 pm today. Reports right ankle swelling, unchanged chronic hip pain, and atypical numbness in right toes. Reports recent fall in her home 2 days ago from tripping and hit her head without LOC. Denies injurying ankle during fall. Describes pain as shooting. Denies recent injuries, twisting right ankle, insect or snake bite, weakness, fever, chest pain, back pain, and shortness of breath. Reports pain is aggravated with movement and alleviated with rest and hydrocodone. Reports she is able to ambulate with normal gait, but with pain. Reports taking 2 hydrocodone PTA with complete relief of symptoms. Reports scheduled left hip surgery next week and has been temporarily taken off aspirin and NSAIDs before surgery with Dr. Cleophas Dunker.     Past Medical History  Diagnosis Date  . Hypertension   . Diabetes mellitus   . Arthritis     Past Surgical History  Procedure Laterality Date  . Vaginal hysterectomy    . Joint replacement      left and right knee replacement    Family History  Problem Relation Age of Onset  . Arthritis Mother   . Arthritis Father   . Diabetes Sister   . Arthritis Sister   . Diabetes Maternal Grandmother   . Arthritis Maternal Grandmother   . Diabetes Maternal Grandfather    . Arthritis Maternal Grandfather     History  Substance Use Topics  . Smoking status: Never Smoker   . Smokeless tobacco: Never Used  . Alcohol Use: No    OB History   Grav Para Term Preterm Abortions TAB SAB Ect Mult Living                  Review of Systems  Constitutional: Negative for fever.  Respiratory: Negative for shortness of breath.   Cardiovascular: Negative for chest pain.  Musculoskeletal: Positive for joint swelling and arthralgias.  Neurological: Negative for syncope, weakness and numbness.    Allergies  Penicillins; Sulfa antibiotics; and Theophyllines  Home Medications   Current Outpatient Rx  Name  Route  Sig  Dispense  Refill  . Acetaminophen (TYLENOL ARTHRITIS PAIN PO)   Oral   Take 2 tablets by mouth 2 (two) times daily.         . cycloSPORINE (RESTASIS) 0.05 % ophthalmic emulsion   Both Eyes   Place 1 drop into both eyes 2 (two) times daily.         Marland Kitchen gabapentin (NEURONTIN) 800 MG tablet   Oral   Take 800 mg by mouth 2 (two) times daily.         Marland Kitchen HYDROcodone-acetaminophen (NORCO/VICODIN) 5-325 MG per tablet   Oral   Take 2 tablets by mouth every 6 (six) hours as needed. For pain.   15 tablet   0   . metFORMIN (GLUMETZA) 500  MG (MOD) 24 hr tablet   Oral   Take 500 mg by mouth daily with breakfast.         . potassium chloride SA (K-DUR,KLOR-CON) 20 MEQ tablet   Oral   Take 20 mEq by mouth 2 (two) times daily.         Marland Kitchen triamterene-hydrochlorothiazide (MAXZIDE-25) 37.5-25 MG per tablet   Oral   Take 1 tablet by mouth daily.           BP 143/87  Pulse 97  Temp(Src) 98.7 F (37.1 C) (Oral)  Resp 20  SpO2 99%  Physical Exam  Nursing note and vitals reviewed. Constitutional: She appears well-developed and well-nourished. No distress.  HENT:  Head: Normocephalic and atraumatic.  Neck: Neck supple.  Pulmonary/Chest: Effort normal.  Musculoskeletal:       Right ankle: She exhibits decreased range of motion and  swelling. She exhibits no ecchymosis, no deformity, no laceration and normal pulse.       Right lower leg: Normal.       Right foot: Normal.       Feet:  Spine without tenderness, crepitus, and stepoffs.  Lower extremities:  Strength 5/5, sensation intact*, distal pulses intact.  *Pt does state that from midshin down her sensation is "different" but this is diffuse, does not follow any dermatome.     Neurological: She is alert.  Skin: She is not diaphoretic.  Skin without erythema or warmth  ED Course  Procedures (including critical care time) Medications - No data to display  Labs Reviewed - No data to display Dg Ankle Complete Right  07/25/2012   *RADIOLOGY REPORT*  Clinical Data: Severe right ankle pain.  No recent injuries. Current history of arthritis in the ankle.  RIGHT ANKLE - COMPLETE 3+ VIEW  Comparison: None.  Findings: Complete loss of the joint space with extensive hypertrophic spurring and calcified loose bodies within the joint space.  No evidence of acute fracture or dislocation.  Severe degenerative changes in the visualized midfoot.  Moderately large plantar calcaneal spur.  IMPRESSION: Severe degenerative changes, query Charcot joint.  Calcified loose bodies within the joint.  No acute osseous abnormality.   Original Report Authenticated By: Hulan Saas, M.D.    Discussed pt with Dr Adriana Simas who also saw and examined patient.   1. Right ankle pain      MDM  Pt with severe arthritis of multiple joints presents with pain and swelling to right ankle.  No known injury.  No acute abnormality on xray.  Exam is unremarkable - mild swelling and tenderness.  Subjective abnormal sensation does not follow dermatomal pattern.  Sensation was intact on both my and Dr Patsey Berthold exams.  Placed in ASO, Rx for walker, ortho follow up.  Discussed all results with patient.  Pt given return precautions.  Pt verbalizes understanding and agrees with plan.     I doubt any other EMC precluding  discharge at this time including, but not necessarily limited to the following: septic joint, DVT, cellulitis   I personally performed the services described in this documentation, which was scribed in my presence. The recorded information has been reviewed and is accurate.        Trixie Dredge, PA-C 07/25/12 2154

## 2012-07-25 NOTE — ED Notes (Addendum)
States she has arthritis in R ankle and having more pain than normal. Denies injuries. ambulatory with pain. Took 2 hydrocodone before coming in with some pain relief

## 2012-07-26 NOTE — ED Provider Notes (Signed)
Medical screening examination/treatment/procedure(s) were conducted as a shared visit with non-physician practitioner(s) and myself.  I personally evaluated the patient during the encounter.  Tender over medial malleolus of right ankle.  X-ray negative.  Donnetta Hutching, MD 07/26/12 [phone]

## 2012-07-27 ENCOUNTER — Encounter (HOSPITAL_COMMUNITY): Payer: Self-pay | Admitting: Pharmacy Technician

## 2012-07-27 NOTE — Pre-Procedure Instructions (Addendum)
Sandra Brennan  07/27/2012   Your procedure is scheduled on:  Tuesday, June 3rd.  Report to [facility] at 8:50 AM.  Call this number if you have problems the morning of surgery: [phone]   Remember:   Do not eat food or drink liquids after midnight.   Take these medicines the morning of surgery with A SIP OF WATER: gabapentin (NEURONTIN),omeprazole (PRILOSEC)   Take if needed:HYDROcodone-acetaminophen (NORCO/VICODIN) or Acetaminophen (TYLENOL ARTHRITIS).      Do not wear jewelry, make-up or nail polish.  Do not wear lotions, powders, or perfumes. You may wear deodorant.  Do not shave 48 hours prior to surgery.  Do not bring valuables to the hospital.  Contacts, dentures or bridgework may not be worn into surgery.  Leave suitcase in the car. After surgery it may be brought to your room.  For patients admitted to the hospital, checkout time is 11:00 AM the day of discharge.    Special Instructions: Shower using CHG 2 nights before surgery and the night before surgery.  If you shower the day of surgery use CHG.  Use special wash - you have one bottle of CHG for all showers.  You should use approximately 1/3 of the bottle for each shower.   Please read over the following fact sheets that you were given: Pain Booklet, Coughing and Deep Breathing, Blood Transfusion Information and Surgical Site Infection Prevention

## 2012-07-28 ENCOUNTER — Encounter (HOSPITAL_COMMUNITY)
Admission: RE | Admit: 2012-07-28 | Discharge: 2012-07-28 | Disposition: A | Discharge status: Home / Self Care | Payer: Medicare Other | Source: Ambulatory Visit | Attending: Orthopaedic Surgery | Admitting: Orthopaedic Surgery

## 2012-07-28 ENCOUNTER — Encounter (HOSPITAL_COMMUNITY): Payer: Self-pay

## 2012-07-28 ENCOUNTER — Ambulatory Visit (HOSPITAL_COMMUNITY)
Admission: RE | Admit: 2012-07-28 | Discharge: 2012-07-28 | Disposition: A | Discharge status: Home / Self Care | Payer: Medicare Other | Source: Ambulatory Visit | Attending: Orthopedic Surgery | Admitting: Orthopedic Surgery

## 2012-07-28 DIAGNOSIS — Z01812 Encounter for preprocedural laboratory examination: Secondary | ICD-10-CM | POA: Insufficient documentation

## 2012-07-28 DIAGNOSIS — I10 Essential (primary) hypertension: Secondary | ICD-10-CM | POA: Insufficient documentation

## 2012-07-28 DIAGNOSIS — Z01818 Encounter for other preprocedural examination: Secondary | ICD-10-CM | POA: Insufficient documentation

## 2012-07-28 DIAGNOSIS — M4000 Postural kyphosis, site unspecified: Secondary | ICD-10-CM | POA: Insufficient documentation

## 2012-07-28 HISTORY — DX: Unspecified asthma, uncomplicated: J45.909

## 2012-07-28 HISTORY — DX: Acute myocardial infarction, unspecified: I21.9

## 2012-07-28 HISTORY — DX: Shortness of breath: R06.02

## 2012-07-28 HISTORY — DX: Sleep apnea, unspecified: G47.30

## 2012-07-28 LAB — CBC
HCT: 35.3 % — ABNORMAL LOW (ref 36.0–46.0)
MCH: 28.7 pg (ref 26.0–34.0)
MCV: 88 fL (ref 78.0–100.0)
RDW: 13.4 % (ref 11.5–15.5)
WBC: 5.9 10*3/uL (ref 4.0–10.5)

## 2012-07-28 LAB — COMPREHENSIVE METABOLIC PANEL
Albumin: 3.8 g/dL (ref 3.5–5.2)
BUN: 11 mg/dL (ref 6–23)
CO2: 25 mEq/L (ref 19–32)
Chloride: 103 mEq/L (ref 96–112)
Creatinine, Ser: 0.82 mg/dL (ref 0.50–1.10)
GFR calc non Af Amer: 73 mL/min — ABNORMAL LOW (ref >90)
Total Bilirubin: 0.3 mg/dL (ref 0.3–1.2)

## 2012-07-28 LAB — PROTIME-INR
INR: 1.07 (ref 0.00–1.49)
Prothrombin Time: 13.8 seconds (ref 11.6–15.2)

## 2012-07-28 LAB — URINALYSIS, ROUTINE W REFLEX MICROSCOPIC
Bilirubin Urine: NEGATIVE
Hgb urine dipstick: NEGATIVE
Nitrite: NEGATIVE
Specific Gravity, Urine: 1.027 (ref 1.005–1.030)
pH: 5 (ref 5.0–8.0)

## 2012-07-28 NOTE — Progress Notes (Signed)
Pt states she had an EKG and revieved clearance from Dr Petra Kuba.. I faxed a request to Dr Junius Roads office requesting information.

## 2012-07-30 LAB — URINE CULTURE

## 2012-08-02 MED ORDER — VANCOMYCIN HCL 10 G IV SOLR
1500.0000 mg | INTRAVENOUS | Status: AC
  Administered 2012-08-03: 1500 mg via INTRAVENOUS
  Filled 2012-08-02 (×2): qty 1500

## 2012-08-03 ENCOUNTER — Inpatient Hospital Stay (HOSPITAL_COMMUNITY)
Admission: RE | Admit: 2012-08-03 | Discharge: 2012-08-06 | DRG: 470 | Disposition: A | Discharge status: Skilled Nursing Facility | Payer: Medicare Other | Source: Ambulatory Visit | Attending: Orthopaedic Surgery | Admitting: Orthopaedic Surgery

## 2012-08-03 ENCOUNTER — Inpatient Hospital Stay (HOSPITAL_COMMUNITY): Payer: Medicare Other

## 2012-08-03 ENCOUNTER — Encounter (HOSPITAL_COMMUNITY): Payer: Self-pay | Admitting: Certified Registered"

## 2012-08-03 ENCOUNTER — Encounter (HOSPITAL_COMMUNITY)
Admission: RE | Disposition: A | Discharge status: Skilled Nursing Facility | Payer: Self-pay | Source: Ambulatory Visit | Attending: Orthopaedic Surgery

## 2012-08-03 ENCOUNTER — Encounter (HOSPITAL_COMMUNITY): Payer: Self-pay | Admitting: *Deleted

## 2012-08-03 ENCOUNTER — Inpatient Hospital Stay (HOSPITAL_COMMUNITY): Payer: Medicare Other | Admitting: Certified Registered"

## 2012-08-03 DIAGNOSIS — Z6841 Body Mass Index (BMI) 40.0 and over, adult: Secondary | ICD-10-CM

## 2012-08-03 DIAGNOSIS — M25559 Pain in unspecified hip: Secondary | ICD-10-CM | POA: Diagnosis not present

## 2012-08-03 DIAGNOSIS — K219 Gastro-esophageal reflux disease without esophagitis: Secondary | ICD-10-CM | POA: Diagnosis present

## 2012-08-03 DIAGNOSIS — R269 Unspecified abnormalities of gait and mobility: Secondary | ICD-10-CM | POA: Diagnosis not present

## 2012-08-03 DIAGNOSIS — J45909 Unspecified asthma, uncomplicated: Secondary | ICD-10-CM | POA: Diagnosis not present

## 2012-08-03 DIAGNOSIS — G4733 Obstructive sleep apnea (adult) (pediatric): Secondary | ICD-10-CM | POA: Diagnosis present

## 2012-08-03 DIAGNOSIS — Z96659 Presence of unspecified artificial knee joint: Secondary | ICD-10-CM | POA: Diagnosis not present

## 2012-08-03 DIAGNOSIS — M6281 Muscle weakness (generalized): Secondary | ICD-10-CM | POA: Diagnosis not present

## 2012-08-03 DIAGNOSIS — I252 Old myocardial infarction: Secondary | ICD-10-CM

## 2012-08-03 DIAGNOSIS — Z79899 Other long term (current) drug therapy: Secondary | ICD-10-CM

## 2012-08-03 DIAGNOSIS — E119 Type 2 diabetes mellitus without complications: Secondary | ICD-10-CM | POA: Diagnosis present

## 2012-08-03 DIAGNOSIS — D62 Acute posthemorrhagic anemia: Secondary | ICD-10-CM | POA: Diagnosis not present

## 2012-08-03 DIAGNOSIS — M169 Osteoarthritis of hip, unspecified: Principal | ICD-10-CM | POA: Diagnosis present

## 2012-08-03 DIAGNOSIS — M1990 Unspecified osteoarthritis, unspecified site: Secondary | ICD-10-CM | POA: Diagnosis not present

## 2012-08-03 DIAGNOSIS — E1142 Type 2 diabetes mellitus with diabetic polyneuropathy: Secondary | ICD-10-CM | POA: Diagnosis present

## 2012-08-03 DIAGNOSIS — IMO0001 Reserved for inherently not codable concepts without codable children: Secondary | ICD-10-CM | POA: Diagnosis not present

## 2012-08-03 DIAGNOSIS — Z96649 Presence of unspecified artificial hip joint: Secondary | ICD-10-CM | POA: Diagnosis not present

## 2012-08-03 DIAGNOSIS — R279 Unspecified lack of coordination: Secondary | ICD-10-CM | POA: Diagnosis not present

## 2012-08-03 DIAGNOSIS — M109 Gout, unspecified: Secondary | ICD-10-CM | POA: Diagnosis not present

## 2012-08-03 DIAGNOSIS — Z87891 Personal history of nicotine dependence: Secondary | ICD-10-CM

## 2012-08-03 DIAGNOSIS — M1610 Unilateral primary osteoarthritis, unspecified hip: Principal | ICD-10-CM | POA: Diagnosis present

## 2012-08-03 DIAGNOSIS — G909 Disorder of the autonomic nervous system, unspecified: Secondary | ICD-10-CM | POA: Diagnosis not present

## 2012-08-03 DIAGNOSIS — M810 Age-related osteoporosis without current pathological fracture: Secondary | ICD-10-CM | POA: Diagnosis present

## 2012-08-03 DIAGNOSIS — M818 Other osteoporosis without current pathological fracture: Secondary | ICD-10-CM | POA: Diagnosis not present

## 2012-08-03 DIAGNOSIS — M1612 Unilateral primary osteoarthritis, left hip: Secondary | ICD-10-CM

## 2012-08-03 DIAGNOSIS — I10 Essential (primary) hypertension: Secondary | ICD-10-CM | POA: Diagnosis present

## 2012-08-03 DIAGNOSIS — G8929 Other chronic pain: Secondary | ICD-10-CM | POA: Diagnosis not present

## 2012-08-03 DIAGNOSIS — G4730 Sleep apnea, unspecified: Secondary | ICD-10-CM | POA: Diagnosis present

## 2012-08-03 DIAGNOSIS — M549 Dorsalgia, unspecified: Secondary | ICD-10-CM | POA: Diagnosis not present

## 2012-08-03 DIAGNOSIS — Z471 Aftercare following joint replacement surgery: Secondary | ICD-10-CM | POA: Diagnosis not present

## 2012-08-03 DIAGNOSIS — Z7982 Long term (current) use of aspirin: Secondary | ICD-10-CM | POA: Diagnosis not present

## 2012-08-03 DIAGNOSIS — S79919A Unspecified injury of unspecified hip, initial encounter: Secondary | ICD-10-CM | POA: Diagnosis not present

## 2012-08-03 HISTORY — DX: Low back pain, unspecified: M54.50

## 2012-08-03 HISTORY — DX: Low back pain: M54.5

## 2012-08-03 HISTORY — DX: Unspecified osteoarthritis, unspecified site: M19.90

## 2012-08-03 HISTORY — DX: Age-related osteoporosis without current pathological fracture: M81.0

## 2012-08-03 HISTORY — DX: Gastro-esophageal reflux disease without esophagitis: K21.9

## 2012-08-03 HISTORY — DX: Anemia, unspecified: D64.9

## 2012-08-03 HISTORY — DX: Other chronic pain: G89.29

## 2012-08-03 HISTORY — DX: Migraine, unspecified, not intractable, without status migrainosus: G43.909

## 2012-08-03 HISTORY — PX: TOTAL HIP ARTHROPLASTY: SHX124

## 2012-08-03 HISTORY — DX: Type 2 diabetes mellitus without complications: E11.9

## 2012-08-03 LAB — GLUCOSE, CAPILLARY
Glucose-Capillary: 87 mg/dL (ref 70–99)
Glucose-Capillary: 128 mg/dL — ABNORMAL HIGH (ref 70–99)
Glucose-Capillary: 131 mg/dL — ABNORMAL HIGH (ref 70–99)
Glucose-Capillary: 117 mg/dL — ABNORMAL HIGH (ref 70–99)

## 2012-08-03 LAB — CBC
MCH: 29 pg (ref 26.0–34.0)
MCV: 88.4 fL (ref 78.0–100.0)
Platelets: 203 10*3/uL (ref 150–400)
RDW: 13.2 % (ref 11.5–15.5)
WBC: 10.8 10*3/uL — ABNORMAL HIGH (ref 4.0–10.5)

## 2012-08-03 LAB — POCT I-STAT 4, (NA,K, GLUC, HGB,HCT)
HCT: 29 % — ABNORMAL LOW (ref 36.0–46.0)
Hemoglobin: 9.9 g/dL — ABNORMAL LOW (ref 12.0–15.0)
Sodium: 140 mEq/L (ref 135–145)

## 2012-08-03 LAB — PREPARE RBC (CROSSMATCH)

## 2012-08-03 SURGERY — ARTHROPLASTY, HIP, TOTAL,POSTERIOR APPROACH
Anesthesia: General | Site: Hip | Laterality: Left | Wound class: Clean

## 2012-08-03 MED ORDER — METOCLOPRAMIDE HCL 5 MG/ML IJ SOLN: 5.0000 mg | Freq: Three times a day (TID) | INTRAMUSCULAR | Status: DC | PRN

## 2012-08-03 MED ORDER — ALBUMIN HUMAN 5 % IV SOLN
INTRAVENOUS | Status: DC | PRN
  Administered 2012-08-03: 13:00:00 via INTRAVENOUS

## 2012-08-03 MED ORDER — THROMBIN 20000 UNITS EX KIT
PACK | CUTANEOUS | Status: DC | PRN
  Administered 2012-08-03: 20000 [IU] via TOPICAL

## 2012-08-03 MED ORDER — TRIAMTERENE-HCTZ 37.5-25 MG PO TABS
1.0000 | ORAL_TABLET | Freq: Every day | ORAL | Status: DC
  Administered 2012-08-03 – 2012-08-06 (×4): 1 via ORAL
  Filled 2012-08-03 (×4): qty 1

## 2012-08-03 MED ORDER — FLEET ENEMA 7-19 GM/118ML RE ENEM
1.0000 | ENEMA | Freq: Once | RECTAL | Status: AC | PRN
  Filled 2012-08-03: qty 1

## 2012-08-03 MED ORDER — MAGNESIUM HYDROXIDE 400 MG/5ML PO SUSP: 30.0000 mL | Freq: Every day | ORAL | Status: DC | PRN

## 2012-08-03 MED ORDER — HYDROMORPHONE HCL PF 1 MG/ML IJ SOLN
INTRAMUSCULAR | Status: DC | PRN
  Administered 2012-08-03 (×2): 0.5 mg via INTRAVENOUS

## 2012-08-03 MED ORDER — ROCURONIUM BROMIDE 100 MG/10ML IV SOLN
INTRAVENOUS | Status: DC | PRN
  Administered 2012-08-03: 10 mg via INTRAVENOUS
  Administered 2012-08-03: 50 mg via INTRAVENOUS
  Administered 2012-08-03: 10 mg via INTRAVENOUS

## 2012-08-03 MED ORDER — BISACODYL 10 MG RE SUPP: 10.0000 mg | Freq: Every day | RECTAL | Status: DC | PRN

## 2012-08-03 MED ORDER — PANTOPRAZOLE SODIUM 40 MG PO TBEC
80.0000 mg | DELAYED_RELEASE_TABLET | Freq: Every day | ORAL | Status: DC
  Administered 2012-08-03 – 2012-08-06 (×4): 80 mg via ORAL
  Filled 2012-08-03: qty 1
  Filled 2012-08-03: qty 2
  Filled 2012-08-03 (×3): qty 1

## 2012-08-03 MED ORDER — GLYCOPYRROLATE 0.2 MG/ML IJ SOLN
INTRAMUSCULAR | Status: DC | PRN
  Administered 2012-08-03: 0.6 mg via INTRAVENOUS

## 2012-08-03 MED ORDER — CHLORHEXIDINE GLUCONATE 4 % EX LIQD: 60.0000 mL | Freq: Every day | CUTANEOUS | Status: DC

## 2012-08-03 MED ORDER — RIVAROXABAN 10 MG PO TABS
10.0000 mg | ORAL_TABLET | ORAL | Status: DC
  Administered 2012-08-04 – 2012-08-06 (×3): 10 mg via ORAL
  Filled 2012-08-03 (×3): qty 1

## 2012-08-03 MED ORDER — PROMETHAZINE HCL 25 MG/ML IJ SOLN: 6.2500 mg | INTRAMUSCULAR | Status: DC | PRN

## 2012-08-03 MED ORDER — GABAPENTIN 400 MG PO CAPS
800.0000 mg | ORAL_CAPSULE | Freq: Two times a day (BID) | ORAL | Status: DC
  Administered 2012-08-03 – 2012-08-06 (×6): 800 mg via ORAL
  Filled 2012-08-03 (×6): qty 2

## 2012-08-03 MED ORDER — DOCUSATE SODIUM 100 MG PO CAPS
100.0000 mg | ORAL_CAPSULE | Freq: Two times a day (BID) | ORAL | Status: DC
  Administered 2012-08-03 – 2012-08-06 (×6): 100 mg via ORAL
  Filled 2012-08-03 (×6): qty 1

## 2012-08-03 MED ORDER — PHENYLEPHRINE HCL 10 MG/ML IJ SOLN
INTRAMUSCULAR | Status: DC | PRN
  Administered 2012-08-03: 40 ug via INTRAVENOUS
  Administered 2012-08-03: 80 ug via INTRAVENOUS
  Administered 2012-08-03: 40 ug via INTRAVENOUS
  Administered 2012-08-03: 80 ug via INTRAVENOUS
  Administered 2012-08-03: 100 ug via INTRAVENOUS
  Administered 2012-08-03: 80 ug via INTRAVENOUS
  Administered 2012-08-03: 100 ug via INTRAVENOUS
  Administered 2012-08-03 (×5): 80 ug via INTRAVENOUS

## 2012-08-03 MED ORDER — FENTANYL CITRATE 0.05 MG/ML IJ SOLN
INTRAMUSCULAR | Status: DC | PRN
  Administered 2012-08-03 (×5): 50 ug via INTRAVENOUS
  Administered 2012-08-03: 100 ug via INTRAVENOUS

## 2012-08-03 MED ORDER — HYDROMORPHONE HCL PF 1 MG/ML IJ SOLN
0.5000 mg | INTRAMUSCULAR | Status: DC | PRN
  Administered 2012-08-03 – 2012-08-04 (×3): 0.5 mg via INTRAVENOUS
  Filled 2012-08-03 (×3): qty 1

## 2012-08-03 MED ORDER — NEOSTIGMINE METHYLSULFATE 1 MG/ML IJ SOLN
INTRAMUSCULAR | Status: DC | PRN
  Administered 2012-08-03: 4 mg via INTRAVENOUS

## 2012-08-03 MED ORDER — LACTATED RINGERS IV SOLN
INTRAVENOUS | Status: DC | PRN
  Administered 2012-08-03 (×2): via INTRAVENOUS

## 2012-08-03 MED ORDER — EPHEDRINE SULFATE 50 MG/ML IJ SOLN
INTRAMUSCULAR | Status: DC | PRN
  Administered 2012-08-03: 10 mg via INTRAVENOUS

## 2012-08-03 MED ORDER — LABETALOL HCL 5 MG/ML IV SOLN
INTRAVENOUS | Status: DC | PRN
  Administered 2012-08-03: 10 mg via INTRAVENOUS

## 2012-08-03 MED ORDER — FENTANYL CITRATE 0.05 MG/ML IJ SOLN
25.0000 ug | INTRAMUSCULAR | Status: DC | PRN
  Administered 2012-08-03 (×2): 25 ug via INTRAVENOUS
  Administered 2012-08-03: 50 ug via INTRAVENOUS
  Administered 2012-08-03 (×2): 25 ug via INTRAVENOUS

## 2012-08-03 MED ORDER — METHOCARBAMOL 500 MG PO TABS
500.0000 mg | ORAL_TABLET | Freq: Four times a day (QID) | ORAL | Status: DC | PRN
  Administered 2012-08-03 – 2012-08-06 (×6): 500 mg via ORAL
  Filled 2012-08-03 (×6): qty 1

## 2012-08-03 MED ORDER — PHENOL 1.4 % MT LIQD: 1.0000 | OROMUCOSAL | Status: DC | PRN

## 2012-08-03 MED ORDER — CHLORHEXIDINE GLUCONATE 4 % EX LIQD: 60.0000 mL | Freq: Once | CUTANEOUS | Status: DC

## 2012-08-03 MED ORDER — PNEUMOCOCCAL VAC POLYVALENT 25 MCG/0.5ML IJ INJ
0.5000 mL | INJECTION | INTRAMUSCULAR | Status: AC
  Filled 2012-08-03: qty 0.5

## 2012-08-03 MED ORDER — ACETAMINOPHEN 10 MG/ML IV SOLN: 1000.0000 mg | Freq: Once | INTRAVENOUS | Status: DC

## 2012-08-03 MED ORDER — MENTHOL 3 MG MT LOZG: 1.0000 | LOZENGE | OROMUCOSAL | Status: DC | PRN

## 2012-08-03 MED ORDER — FENTANYL CITRATE 0.05 MG/ML IJ SOLN
INTRAMUSCULAR | Status: AC
  Filled 2012-08-03: qty 2

## 2012-08-03 MED ORDER — HYDROMORPHONE HCL PF 1 MG/ML IJ SOLN
INTRAMUSCULAR | Status: AC
  Administered 2012-08-03: 0.5 mg via INTRAVENOUS
  Filled 2012-08-03: qty 1

## 2012-08-03 MED ORDER — ALBUTEROL SULFATE HFA 108 (90 BASE) MCG/ACT IN AERS
2.0000 | INHALATION_SPRAY | Freq: Four times a day (QID) | RESPIRATORY_TRACT | Status: DC | PRN
  Filled 2012-08-03: qty 6.7

## 2012-08-03 MED ORDER — MEPERIDINE HCL 25 MG/ML IJ SOLN: 6.2500 mg | INTRAMUSCULAR | Status: DC | PRN

## 2012-08-03 MED ORDER — POTASSIUM CHLORIDE CRYS ER 20 MEQ PO TBCR
20.0000 meq | EXTENDED_RELEASE_TABLET | Freq: Two times a day (BID) | ORAL | Status: DC
  Administered 2012-08-03 – 2012-08-06 (×6): 20 meq via ORAL
  Filled 2012-08-03 (×8): qty 1

## 2012-08-03 MED ORDER — METHOCARBAMOL 500 MG PO TABS
ORAL_TABLET | ORAL | Status: AC
  Filled 2012-08-03: qty 1

## 2012-08-03 MED ORDER — ALUM & MAG HYDROXIDE-SIMETH 200-200-20 MG/5ML PO SUSP: 30.0000 mL | ORAL | Status: DC | PRN

## 2012-08-03 MED ORDER — LIDOCAINE HCL (CARDIAC) 20 MG/ML IV SOLN
INTRAVENOUS | Status: DC | PRN
  Administered 2012-08-03: 50 mg via INTRAVENOUS

## 2012-08-03 MED ORDER — OXYCODONE HCL 5 MG PO TABS
ORAL_TABLET | ORAL | Status: AC
  Filled 2012-08-03: qty 1

## 2012-08-03 MED ORDER — MIDAZOLAM HCL 5 MG/5ML IJ SOLN
INTRAMUSCULAR | Status: DC | PRN
  Administered 2012-08-03 (×2): 1 mg via INTRAVENOUS

## 2012-08-03 MED ORDER — GABAPENTIN 800 MG PO TABS
800.0000 mg | ORAL_TABLET | Freq: Two times a day (BID) | ORAL | Status: DC
  Filled 2012-08-03 (×2): qty 1

## 2012-08-03 MED ORDER — ONDANSETRON HCL 4 MG/2ML IJ SOLN
INTRAMUSCULAR | Status: DC | PRN
  Administered 2012-08-03: 4 mg via INTRAVENOUS

## 2012-08-03 MED ORDER — THROMBIN 20000 UNITS EX KIT
PACK | CUTANEOUS | Status: AC
  Filled 2012-08-03: qty 1

## 2012-08-03 MED ORDER — BUPIVACAINE-EPINEPHRINE PF 0.25-1:200000 % IJ SOLN
INTRAMUSCULAR | Status: AC
  Filled 2012-08-03: qty 30

## 2012-08-03 MED ORDER — VANCOMYCIN HCL IN DEXTROSE 1-5 GM/200ML-% IV SOLN
1000.0000 mg | Freq: Two times a day (BID) | INTRAVENOUS | Status: AC
  Administered 2012-08-03: 1000 mg via INTRAVENOUS
  Filled 2012-08-03: qty 200

## 2012-08-03 MED ORDER — OXYCODONE HCL 5 MG PO TABS
5.0000 mg | ORAL_TABLET | ORAL | Status: DC | PRN
  Administered 2012-08-03: 5 mg via ORAL
  Administered 2012-08-03 – 2012-08-04 (×3): 10 mg via ORAL
  Administered 2012-08-04 (×2): 5 mg via ORAL
  Administered 2012-08-04 (×2): 10 mg via ORAL
  Administered 2012-08-05: 5 mg via ORAL
  Administered 2012-08-05 – 2012-08-06 (×6): 10 mg via ORAL
  Filled 2012-08-03: qty 1
  Filled 2012-08-03 (×13): qty 2

## 2012-08-03 MED ORDER — SODIUM CHLORIDE 0.9 % IV SOLN
75.0000 mL/h | INTRAVENOUS | Status: DC
  Administered 2012-08-03 – 2012-08-04 (×3): 75 mL/h via INTRAVENOUS

## 2012-08-03 MED ORDER — METOCLOPRAMIDE HCL 5 MG PO TABS: 5.0000 mg | ORAL_TABLET | Freq: Three times a day (TID) | ORAL | Status: DC | PRN

## 2012-08-03 MED ORDER — INSULIN ASPART 100 UNIT/ML ~~LOC~~ SOLN
0.0000 [IU] | Freq: Three times a day (TID) | SUBCUTANEOUS | Status: DC
  Administered 2012-08-04 (×2): 3 [IU] via SUBCUTANEOUS
  Administered 2012-08-05: 4 [IU] via SUBCUTANEOUS

## 2012-08-03 MED ORDER — ONDANSETRON HCL 4 MG PO TABS: 4.0000 mg | ORAL_TABLET | Freq: Four times a day (QID) | ORAL | Status: DC | PRN

## 2012-08-03 MED ORDER — BUPIVACAINE-EPINEPHRINE PF 0.25-1:200000 % IJ SOLN
INTRAMUSCULAR | Status: DC | PRN
  Administered 2012-08-03: 10 mL
  Administered 2012-08-03: 8 mL

## 2012-08-03 MED ORDER — LACTATED RINGERS IV SOLN
INTRAVENOUS | Status: DC
  Administered 2012-08-03: 10:00:00 via INTRAVENOUS

## 2012-08-03 MED ORDER — PROPOFOL 10 MG/ML IV BOLUS
INTRAVENOUS | Status: DC | PRN
  Administered 2012-08-03: 150 mg via INTRAVENOUS

## 2012-08-03 MED ORDER — CYCLOSPORINE 0.05 % OP EMUL
1.0000 [drp] | Freq: Two times a day (BID) | OPHTHALMIC | Status: DC
  Administered 2012-08-03 – 2012-08-06 (×6): 1 [drp] via OPHTHALMIC
  Filled 2012-08-03 (×8): qty 1

## 2012-08-03 MED ORDER — METHOCARBAMOL 100 MG/ML IJ SOLN
500.0000 mg | Freq: Four times a day (QID) | INTRAVENOUS | Status: DC | PRN
  Filled 2012-08-03 (×2): qty 5

## 2012-08-03 MED ORDER — 0.9 % SODIUM CHLORIDE (POUR BTL) OPTIME
TOPICAL | Status: DC | PRN
  Administered 2012-08-03 (×2): 1000 mL

## 2012-08-03 MED ORDER — SODIUM CHLORIDE 0.9 % IV SOLN: INTRAVENOUS | Status: DC

## 2012-08-03 MED ORDER — ONDANSETRON HCL 4 MG/2ML IJ SOLN: 4.0000 mg | Freq: Four times a day (QID) | INTRAMUSCULAR | Status: DC | PRN

## 2012-08-03 SURGICAL SUPPLY — 73 items
BLADE SAW SAG 73X25 THK (BLADE) ×1
BLADE SAW SGTL 73X25 THK (BLADE) ×1 IMPLANT
BLADE SURG 10 STRL SS (BLADE) ×2 IMPLANT
BRUSH FEMORAL CANAL (MISCELLANEOUS) IMPLANT
CAPT HIP PF MOP ×2 IMPLANT
CLOTH BEACON ORANGE TIMEOUT ST (SAFETY) ×2 IMPLANT
COVER BACK TABLE 24X17X13 BIG (DRAPES) IMPLANT
COVER SURGICAL LIGHT HANDLE (MISCELLANEOUS) ×2 IMPLANT
DRAPE INCISE IOBAN 66X45 STRL (DRAPES) ×2 IMPLANT
DRAPE ORTHO SPLIT 77X108 STRL (DRAPES) ×2
DRAPE PROXIMA HALF (DRAPES) ×2 IMPLANT
DRAPE SURG ORHT 6 SPLT 77X108 (DRAPES) ×2 IMPLANT
DRSG MEPILEX BORDER 4X12 (GAUZE/BANDAGES/DRESSINGS) IMPLANT
DRSG MEPILEX BORDER 4X8 (GAUZE/BANDAGES/DRESSINGS) ×2 IMPLANT
DRSG PAD ABDOMINAL 8X10 ST (GAUZE/BANDAGES/DRESSINGS) ×4 IMPLANT
DURAPREP 26ML APPLICATOR (WOUND CARE) ×4 IMPLANT
ELECT BLADE 6.5 EXT (BLADE) ×2 IMPLANT
ELECT REM PT RETURN 9FT ADLT (ELECTROSURGICAL) ×2
ELECTRODE REM PT RTRN 9FT ADLT (ELECTROSURGICAL) ×1 IMPLANT
EVACUATOR 1/8 PVC DRAIN (DRAIN) IMPLANT
FACESHIELD LNG OPTICON STERILE (SAFETY) ×4 IMPLANT
GAUZE XEROFORM 1X8 LF (GAUZE/BANDAGES/DRESSINGS) ×4 IMPLANT
GAUZE XEROFORM 5X9 LF (GAUZE/BANDAGES/DRESSINGS) IMPLANT
GLOVE BIO SURGEON STRL SZ7.5 (GLOVE) ×2 IMPLANT
GLOVE BIO SURGEON STRL SZ8.5 (GLOVE) ×2 IMPLANT
GLOVE BIOGEL PI IND STRL 7.0 (GLOVE) ×1 IMPLANT
GLOVE BIOGEL PI IND STRL 8 (GLOVE) ×2 IMPLANT
GLOVE BIOGEL PI IND STRL 8.5 (GLOVE) ×2 IMPLANT
GLOVE BIOGEL PI INDICATOR 7.0 (GLOVE) ×1
GLOVE BIOGEL PI INDICATOR 8 (GLOVE) ×2
GLOVE BIOGEL PI INDICATOR 8.5 (GLOVE) ×2
GLOVE ECLIPSE 8.0 STRL XLNG CF (GLOVE) ×6 IMPLANT
GLOVE SURG ORTHO 8.5 STRL (GLOVE) ×4 IMPLANT
GLOVE SURG SS PI 6.5 STRL IVOR (GLOVE) ×4 IMPLANT
GOWN PREVENTION PLUS XXLARGE (GOWN DISPOSABLE) ×2 IMPLANT
GOWN SRG XL XLNG 56XLVL 4 (GOWN DISPOSABLE) ×1 IMPLANT
GOWN STRL NON-REIN LRG LVL3 (GOWN DISPOSABLE) ×4 IMPLANT
GOWN STRL NON-REIN XL XLG LVL4 (GOWN DISPOSABLE) ×1
GOWN STRL REIN 2XL XLG LVL4 (GOWN DISPOSABLE) ×2 IMPLANT
HANDPIECE INTERPULSE COAX TIP (DISPOSABLE)
IMMOBILIZER KNEE 20 (SOFTGOODS)
IMMOBILIZER KNEE 20 THIGH 36 (SOFTGOODS) IMPLANT
IMMOBILIZER KNEE 22 UNIV (SOFTGOODS) ×2 IMPLANT
IMMOBILIZER KNEE 24 THIGH 36 (MISCELLANEOUS) IMPLANT
IMMOBILIZER KNEE 24 UNIV (MISCELLANEOUS)
KIT BASIN OR (CUSTOM PROCEDURE TRAY) ×2 IMPLANT
KIT ROOM TURNOVER OR (KITS) ×2 IMPLANT
MANIFOLD NEPTUNE II (INSTRUMENTS) ×2 IMPLANT
NEEDLE 22X1 1/2 (OR ONLY) (NEEDLE) ×2 IMPLANT
NS IRRIG 1000ML POUR BTL (IV SOLUTION) ×4 IMPLANT
PACK TOTAL JOINT (CUSTOM PROCEDURE TRAY) ×2 IMPLANT
PAD ARMBOARD 7.5X6 YLW CONV (MISCELLANEOUS) ×4 IMPLANT
PRESSURIZER FEMORAL UNIV (MISCELLANEOUS) IMPLANT
SET HNDPC FAN SPRY TIP SCT (DISPOSABLE) IMPLANT
SPONGE GAUZE 4X4 12PLY (GAUZE/BANDAGES/DRESSINGS) ×2 IMPLANT
SPONGE LAP 18X18 X RAY DECT (DISPOSABLE) ×4 IMPLANT
STAPLER VISISTAT 35W (STAPLE) ×2 IMPLANT
SUCTION FRAZIER TIP 10 FR DISP (SUCTIONS) ×2 IMPLANT
SUT BONE WAX W31G (SUTURE) ×2 IMPLANT
SUT ETHIBOND NAB CT1 #1 30IN (SUTURE) ×6 IMPLANT
SUT MNCRL AB 3-0 PS2 18 (SUTURE) ×2 IMPLANT
SUT VIC AB 0 CT1 27 (SUTURE) ×2
SUT VIC AB 0 CT1 27XBRD ANBCTR (SUTURE) ×2 IMPLANT
SUT VIC AB 1 CT1 27 (SUTURE) ×4
SUT VIC AB 1 CT1 27XBRD ANBCTR (SUTURE) ×4 IMPLANT
SUT VIC AB 2-0 CT1 27 (SUTURE) ×2
SUT VIC AB 2-0 CT1 TAPERPNT 27 (SUTURE) ×2 IMPLANT
SYR CONTROL 10ML LL (SYRINGE) ×2 IMPLANT
TOWEL OR 17X24 6PK STRL BLUE (TOWEL DISPOSABLE) ×2 IMPLANT
TOWEL OR 17X26 10 PK STRL BLUE (TOWEL DISPOSABLE) ×2 IMPLANT
TOWER CARTRIDGE SMART MIX (DISPOSABLE) IMPLANT
TRAY FOLEY CATH 14FR (SET/KITS/TRAYS/PACK) ×2 IMPLANT
WATER STERILE IRR 1000ML POUR (IV SOLUTION) ×2 IMPLANT

## 2012-08-03 NOTE — Op Note (Signed)
PATIENT ID:      Sandra Brennan  MRN:     [id number] DOB/AGE:    1946/02/12 / 67 y.o.       OPERATIVE REPORT    DATE OF PROCEDURE:  08/03/2012       PREOPERATIVE DIAGNOSIS:   Left Hip Osteoarthritis-end stage                                                       Estimated body mass index is 48.27 kg/(m^2) as calculated from the following:   Height as of 11/25/11: 5\' 2"  (1.575 m).   Weight as of 11/25/11: 119.75 kg (264 lb).     POSTOPERATIVE DIAGNOSIS:   Left Hip Osteoarthritis-end stage                                                                     Estimated body mass index is 48.27 kg/(m^2) as calculated from the following:   Height as of 11/25/11: 5\' 2"  (1.575 m).   Weight as of 11/25/11: 119.75 kg (264 lb).     PROCEDURE:  Procedure(s): TOTAL HIP ARTHROPLASTY left     SURGEON:  [name], MD    ASSISTANT:   Jacqualine Code, PA-C   (Present and scrubbed throughout the case, critical for assistance with exposure, retraction, instrumentation, and closure.)          ANESTHESIA: general     DRAINS: none :      TOURNIQUET TIME: * No tourniquets in log *    COMPLICATIONS:  None   CONDITION:  stable  PROCEDURE IN DETAIL: [id number]   [name] W 08/03/2012, 1:00 PM

## 2012-08-03 NOTE — H&P (Signed)
  The recent History & Physical has been reviewed. I have personally examined the patient today. There is no interval change to the documented History & Physical. The patient would like to proceed with the procedure.  Sandra Brennan 08/03/2012,  10:13 AM

## 2012-08-03 NOTE — Transfer of Care (Signed)
Immediate Anesthesia Transfer of Care Note  Patient: Sandra Brennan  Procedure(s) Performed: Procedure(s) with comments: TOTAL HIP ARTHROPLASTY (Left) - Left Total Hip Arthroplasty  Patient Location: PACU  Anesthesia Type:General  Level of Consciousness: awake, alert  and oriented  Airway & Oxygen Therapy: Patient Spontanous Breathing and Patient connected to nasal cannula oxygen  Post-op Assessment: Report given to PACU RN and Post -op Vital signs reviewed and stable  Post vital signs: Reviewed and stable  Complications: No apparent anesthesia complications

## 2012-08-03 NOTE — Progress Notes (Signed)
Orthopedic Tech Progress Note Patient Details:  [name] June 22, 1945 [id number] Patient has knee immobilizer Patient ID: BRION HEDGES, female   DOB: May 27, 1945, 67 y.o.   MRN: [id number]   [name] 08/03/2012, 11:04 PM

## 2012-08-03 NOTE — Anesthesia Preprocedure Evaluation (Addendum)
Anesthesia Evaluation  Patient identified by MRN, date of birth, ID band Patient awake    Reviewed: Allergy & Precautions, H&P , NPO status , Patient's Chart, lab work & pertinent test results  Airway Mallampati: II TM Distance: >3 FB Neck ROM: Full    Dental no notable dental hx.    Pulmonary asthma , sleep apnea ,  breath sounds clear to auscultation  Pulmonary exam normal       Cardiovascular hypertension, Pt. on medications + Past MI (20 yrs ago after death of her child. No problems since) Rhythm:Regular Rate:Normal     Neuro/Psych negative neurological ROS  negative psych ROS   GI/Hepatic negative GI ROS, Neg liver ROS,   Endo/Other  diabetes, Type 2  Renal/GU negative Renal ROS  negative genitourinary   Musculoskeletal negative musculoskeletal ROS (+)   Abdominal   Peds negative pediatric ROS (+)  Hematology negative hematology ROS (+)   Anesthesia Other Findings   Reproductive/Obstetrics negative OB ROS                          Anesthesia Physical Anesthesia Plan  ASA: III  Anesthesia Plan: General   Post-op Pain Management:    Induction: Intravenous  Airway Management Planned: Oral ETT  Additional Equipment:   Intra-op Plan:   Post-operative Plan: Extubation in OR  Informed Consent: I have reviewed the patients History and Physical, chart, labs and discussed the procedure including the risks, benefits and alternatives for the proposed anesthesia with the patient or authorized representative who has indicated his/her understanding and acceptance.   Dental advisory given  Plan Discussed with: CRNA  Anesthesia Plan Comments:         Anesthesia Quick Evaluation

## 2012-08-03 NOTE — Anesthesia Postprocedure Evaluation (Signed)
Anesthesia Post Note  Patient: Sandra Brennan  Procedure(s) Performed: Procedure(s) (LRB): TOTAL HIP ARTHROPLASTY (Left)  Anesthesia type: general  Patient location: PACU  Post pain: Pain level controlled  Post assessment: Patient's Cardiovascular Status Stable  Last Vitals:  Filed Vitals:   08/03/12 1335  Temp: 36.2 C    Post vital signs: Reviewed and stable  Level of consciousness: sedated  Complications: No apparent anesthesia complications

## 2012-08-04 LAB — CBC
HCT: 22.6 % — ABNORMAL LOW (ref 36.0–46.0)
HCT: 28.5 % — ABNORMAL LOW (ref 36.0–46.0)
Hemoglobin: 7.4 g/dL — ABNORMAL LOW (ref 12.0–15.0)
Hemoglobin: 9.5 g/dL — ABNORMAL LOW (ref 12.0–15.0)
MCH: 28.5 pg (ref 26.0–34.0)
MCHC: 32.7 g/dL (ref 30.0–36.0)
RDW: 13.1 % (ref 11.5–15.5)
WBC: 16.7 10*3/uL — ABNORMAL HIGH (ref 4.0–10.5)

## 2012-08-04 LAB — GLUCOSE, CAPILLARY
Glucose-Capillary: 145 mg/dL — ABNORMAL HIGH (ref 70–99)
Glucose-Capillary: 156 mg/dL — ABNORMAL HIGH (ref 70–99)

## 2012-08-04 LAB — BASIC METABOLIC PANEL
BUN: 7 mg/dL (ref 6–23)
Calcium: 8.3 mg/dL — ABNORMAL LOW (ref 8.4–10.5)
Creatinine, Ser: 0.78 mg/dL (ref 0.50–1.10)
GFR calc Af Amer: >90 mL/min (ref >90)
GFR calc non Af Amer: 86 mL/min — ABNORMAL LOW (ref >90)
Glucose, Bld: 125 mg/dL — ABNORMAL HIGH (ref 70–99)
Potassium: 4.3 mEq/L (ref 3.5–5.1)

## 2012-08-04 MED ORDER — BOOST PLUS PO LIQD
237.0000 mL | Freq: Two times a day (BID) | ORAL | Status: DC
  Administered 2012-08-04 – 2012-08-06 (×4): 237 mL via ORAL
  Filled 2012-08-04 (×7): qty 237

## 2012-08-04 MED ORDER — ACETAMINOPHEN 500 MG PO TABS
1000.0000 mg | ORAL_TABLET | Freq: Four times a day (QID) | ORAL | Status: DC
  Administered 2012-08-04 – 2012-08-06 (×10): 1000 mg via ORAL
  Filled 2012-08-04 (×16): qty 2

## 2012-08-04 MED ORDER — HYDROMORPHONE HCL PF 1 MG/ML IJ SOLN: 1.0000 mg | INTRAMUSCULAR | Status: DC | PRN

## 2012-08-04 NOTE — Progress Notes (Signed)
Patient ID: Sandra Brennan, female   DOB: [birth year], 67 y.o.   MRN: [id number] PATIENT ID: Sandra Brennan        MRN:  [id number]          DOB/AGE: Sep 22, 1945 / 67 y.o.    Sandra Campbell, MD   Jacqualine Code, PA-C [address] [place], [place]  [address]                             [phone]   PROGRESS NOTE  Subjective:  negative for Chest Pain  negative for Shortness of Breath  negative for Nausea/Vomiting   negative for Calf Pain    Tolerating Diet: yes         Patient reports pain as moderate.     Will increase dilaudid dose  Objective: Vital signs in last 24 hours:   Patient Vitals for the past 24 hrs:  BP Temp Temp src Pulse Resp SpO2 Weight  08/04/12 0608 119/68 mmHg 99.9 F (37.7 C) Oral 100 18 90 % -  08/04/12 0208 121/61 mmHg 100.6 F (38.1 C) Oral 76 16 100 % -  08/03/12 2138 117/74 mmHg 98.4 F (36.9 C) Oral 85 18 99 % -  08/03/12 2002 139/91 mmHg - - 104 18 99 % 103.4 kg (227 lb 15.3 oz)  08/03/12 1702 151/71 mmHg 98.5 F (36.9 C) Oral 91 18 100 % -  08/03/12 1630 - 97.2 F (36.2 C) - - - - -  08/03/12 1622 144/82 mmHg - - 86 15 100 % -  08/03/12 1600 159/76 mmHg - - - - - -  08/03/12 1452 163/77 mmHg - - 66 14 100 % -  08/03/12 1450 - 97.1 F (36.2 C) - - - - -  08/03/12 1437 164/81 mmHg - - 69 11 100 % -  08/03/12 1422 160/82 mmHg - - 72 9 100 % -  08/03/12 1407 142/67 mmHg - - 68 11 100 % -  08/03/12 1352 143/83 mmHg - - - 22 - -  08/03/12 1337 134/85 mmHg - - - 8 - -  08/03/12 1335 - 97.2 F (36.2 C) - - - - -      Intake/Output from previous day:   06/03 0701 - 06/04 0700 In: 2550 [I.V.:2300] Out: 2200 [Urine:1500]   Intake/Output this shift:       Intake/Output     06/03 0701 - 06/04 0700 06/04 0701 - 06/05 0700   I.V. (mL/kg) 2300 (22.2)    IV Piggyback 250    Total Intake(mL/kg) 2550 (24.7)    Urine (mL/kg/hr) 1500    Blood 700    Total Output 2200     Net +350             LABORATORY DATA:  Recent Labs  07/28/12 1032  08/03/12 1226 08/03/12 1620 08/04/12 0516  WBC 5.9  --  10.8* 10.6*  HGB 11.5* 9.9* 8.0* 7.4*  HCT 35.3* 29.0* 24.4* 22.6*  PLT 293  --  203 182    Recent Labs  07/28/12 1032 08/03/12 1226 08/04/12 0516  NA 139 140 136  K 3.9 3.7 4.3  CL 103  --  104  CO2 25  --  25  BUN 11  --  7  CREATININE 0.82  --  0.78  GLUCOSE 90 133* 125*  CALCIUM 10.0  --  8.3*   Lab Results  Component Value Date   INR  1.07 07/28/2012    Recent Radiographic Studies :   Chest 2 View  07/28/2012   *RADIOLOGY REPORT*  Clinical Data: Preoperative examination (left total hip replacement)  CHEST - 2 VIEW  Comparison: 06/04/2002  Findings:  The examination is degraded secondary to patient body habitus.  Grossly unchanged borderline enlarged cardiac silhouette and mediastinal contours given reduced lung volumes.  Evaluation of the retrosternal clear space is obscured secondary to overlying soft tissues.  No focal airspace opacities.  No pleural effusion or pneumothorax.  No definite evidence of edema.  Multilevel thoracic spine degenerative change.  Age indeterminate mild (approximately 25%) compression deformity of a lower thoracic vertebral body with associated focal kyphosis at this level. Marked degenerative change and irregularity of the bilateral humeral heads, possibly the sequela of avascular necrosis.  IMPRESSION: 1.  Degraded examination without acute cardiopulmonary disease. 2.  Query avascular necrosis involving the bilateral humeral heads. 3.  Age indeterminate mild (approximately 25%) compression deformity of a lower thoracic vertebral body with associated focal kyphosis at this level.  Correlation for point tenderness at this location is recommended.   Original Report Authenticated By: Tacey Ruiz, MD   Dg Ankle Complete Right  07/25/2012   *RADIOLOGY REPORT*  Clinical Data: Severe right ankle pain.  No recent injuries. Current history of arthritis in the ankle.  RIGHT ANKLE - COMPLETE 3+ VIEW   Comparison: None.  Findings: Complete loss of the joint space with extensive hypertrophic spurring and calcified loose bodies within the joint space.  No evidence of acute fracture or dislocation.  Severe degenerative changes in the visualized midfoot.  Moderately large plantar calcaneal spur.  IMPRESSION: Severe degenerative changes, query Charcot joint.  Calcified loose bodies within the joint.  No acute osseous abnormality.   Original Report Authenticated By: Hulan Saas, M.D.   Dg Pelvis Portable  08/03/2012   *RADIOLOGY REPORT*  Clinical Data: Postoperative radiographs.  Left total hip arthroplasty.  PORTABLE PELVIS  Comparison: 07/14/2007.  Findings: New left total hip arthroplasties present.  There are no complicating features.  There is a acetabular screw extending through the cup.  Expected postoperative changes in the soft tissues.  Mild to moderate right hip osteoarthritis.  IMPRESSION: Uncomplicated new left total hip arthroplasty.   Original Report Authenticated By: Andreas Newport, M.D.   Dg Hip Portable 1 View Left  08/03/2012   *RADIOLOGY REPORT*  Clinical Data: Postoperative radiographs.  Left total hip arthroplasty.  PORTABLE LEFT HIP - 1 VIEW  Comparison: Frontal view of the hip.  Today.  Findings: New left total hip arthroplasty appears located.  No complicating features.  Technically suboptimal lateral radiograph.  IMPRESSION: Uncomplicated new left total hip arthroplasty.   Original Report Authenticated By: Andreas Newport, M.D.     Examination:  General appearance: alert, cooperative and moderate distress  Wound Exam: clean, dry, intact   Drainage:  None: wound tissue dry  Motor Exam: EHL, FHL, Anterior Tibial and Posterior Tibial Intact  Sensory Exam: Superficial Peroneal, Deep Peroneal and Tibial normal  Vascular Exam: Normal  Assessment:    1 Day Post-Op  Procedure(s) (LRB): TOTAL HIP ARTHROPLASTY (Left)  ADDITIONAL DIAGNOSIS:  Active Problems:   * No active  hospital problems. *  Acute Blood Loss Anemia-will transfuse two units packed cells   Plan: Physical Therapy as ordered Weight Bearing as Tolerated (WBAT)  DVT Prophylaxis:  Xarelto  DISCHARGE PLAN: Skilled Nursing Facility/Rehab  DISCHARGE NEEDS: HHPT and Walker    OOB with PT, family wishes rehab placement-social  services to assist, post op films with good position of components    [name] 08/04/2012 7:42 AM

## 2012-08-04 NOTE — Progress Notes (Signed)
INITIAL NUTRITION ASSESSMENT  DOCUMENTATION CODES Per approved criteria  -Morbid Obesity   INTERVENTION: 1. Supplements; Boost BID per pt request 2.  General healthful diet; encourage intake as able.  NUTRITION DIAGNOSIS: Unintended wt change related to poor appetite as evidenced by pt report, 100 lbs wt loss per pt report.   Monitor:  1.  Food/Beverage; improvement in intake sufficient to meet >/=90% estimated needs.  Reason for Assessment: MST  67 y.o. female  Admitting Dx: left groin pain  ASSESSMENT: Pt admitted with left groin pain, now s/p L total hip.   Pt sitting in chair at time of visit awaiting lunch.  Pt reports her intake is poor and that she has required great encouragement from family to eat.  Pt states that she was dx with prediabetes "last year" and made some healthy changes which resulted in wt loss.  Pt then started prednisone which greatly diminished her appetite. Pt reports that she weighed 333 lbs at one time- possibly as recent as 5 year ago.   Pt has lost 31% of her usual wt in the past 5 years, 13% in the past 9 months.    RD suspects some level of undernourishments, however unable to dx with malnutrition at this time.  Educated pt on increased kcal and protein needs.  Will continue Boost supplementation as inpatient.   Height: Ht Readings from Last 1 Encounters:  08/04/12 5' (1.524 m)    Weight: Wt Readings from Last 1 Encounters:  08/03/12 227 lb 15.3 oz (103.4 kg)    Ideal Body Weight: 100 lbs  % Ideal Body Weight: 227%  Wt Readings from Last 10 Encounters:  08/03/12 227 lb 15.3 oz (103.4 kg)  08/03/12 227 lb 15.3 oz (103.4 kg)  07/28/12 227 lb 15.3 oz (103.4 kg)  11/25/11 264 lb (119.75 kg)    Usual Body Weight: 333 lbs  % Usual Body Weight: 87%  BMI:  Body mass index is 44.52 kg/(m^2).  Estimated Nutritional Needs: Kcal: 2060-2270 Protein: 100-120g Fluid: >2.0 L/day  Skin: incision  Diet Order: Carb Control  EDUCATION  NEEDS: -Education needs addressed   Intake/Output Summary (Last 24 hours) at 08/04/12 1352 Last data filed at 08/04/12 1015  Gross per 24 hour  Intake    625 ml  Output   1350 ml  Net   -725 ml    Last BM: wnl   Labs:   Recent Labs Lab 08/03/12 1226 08/04/12 0516  NA 140 136  K 3.7 4.3  CL  --  104  CO2  --  25  BUN  --  7  CREATININE  --  0.78  CALCIUM  --  8.3*  GLUCOSE 133* 125*    CBG (last 3)   Recent Labs  08/03/12 2129 08/04/12 0756 08/04/12 1202  GLUCAP 117* 112* 145*    Scheduled Meds: . acetaminophen  1,000 mg Oral Q6H  . cycloSPORINE  1 drop Both Eyes BID  . docusate sodium  100 mg Oral BID  . gabapentin  800 mg Oral BID  . insulin aspart  0-20 Units Subcutaneous TID WC  . pantoprazole  80 mg Oral Daily  . pneumococcal 23 valent vaccine  0.5 mL Intramuscular Tomorrow-1000  . potassium chloride SA  20 mEq Oral BID  . rivaroxaban  10 mg Oral Q24H  . triamterene-hydrochlorothiazide  1 tablet Oral Daily    Continuous Infusions: . sodium chloride 75 mL/hr (08/04/12 4098)    Past Medical History  Diagnosis Date  .  Hypertension   . Asthma   . Shortness of breath     when stressed.  . Myocardial infarction 1992    08-13-2012 "mild MI when son died "  . GERD (gastroesophageal reflux disease)   . Sleep apnea     "never did fix my machine; haven't used one for 5 years; I've lost 116# since then; no problems now" (2012/08/13)  . Type II diabetes mellitus     "borderline; don't test; take Metformin" (Aug 13, 2012)  . Anemia   . Migraines     "used to; totally stopped when I quit drinking 30 yr ago" (08/13/2012)  . Arthritis     "plenty" (2012/08/13)  . Degenerative arthritis     "all over" (08-13-12)  . Osteoporosis     "all over" (08/13/2012)  . Chronic lower back pain     "they say I need a whole new spinal column" (08/13/12)    Past Surgical History  Procedure Laterality Date  . Ganglion cyst excision Right 1980's    "wrist" (08-13-12)  .  Knee ligament reconstruction Right 1980's  . Total hip arthroplasty Left 13-Aug-2012  . Tonsillectomy  ~ 1954  . Total knee arthroplasty Left 2001  . Total knee arthroplasty Right 2004  . Joint replacement    . Abdominal hysterectomy  1990's    Loyce Dys, MS RD LDN Clinical Inpatient Dietitian Pager: [phone] Weekend/After hours pager: [phone]

## 2012-08-04 NOTE — Clinical Social Work Placement (Signed)
     Clinical Social Work Department CLINICAL SOCIAL WORK PLACEMENT NOTE 08/04/2012  Patient:  Sandra Brennan, Sandra Brennan  Account Number:  [id number] Admit date:  08/03/2012  Clinical Social Worker:  Hulan Fray  Date/time:  08/04/2012 01:38 PM  Clinical Social Work is seeking post-discharge placement for this patient at the following level of care:   SKILLED NURSING   (*CSW will update this form in Epic as items are completed)   08/04/2012  Patient/family provided with [facility] Department of Clinical Social Works list of facilities offering this level of care within the geographic area requested by the patient (or if unable, by the patients family).  08/04/2012  Patient/family informed of their freedom to choose among providers that offer the needed level of care, that participate in Medicare, Medicaid or managed care program needed by the patient, have an available bed and are willing to accept the patient.  08/04/2012  Patient/family informed of [facility] ownership interest in [facility], as well as of the fact that they are under no obligation to receive care at this facility.  PASARR submitted to EDS on 08/04/2012 PASARR number received from EDS on 08/04/2012  FL2 transmitted to all facilities in geographic area requested by pt/family on  08/04/2012 FL2 transmitted to all facilities within larger geographic area on   Patient informed that his/her managed care company has contracts with or will negotiate with  certain facilities, including the following:     Patient/family informed of bed offers received:   Patient chooses bed at  Physician recommends and patient chooses bed at    Patient to be transferred to  on   Patient to be transferred to facility by   The following physician request were entered in Epic:   Additional Comments:

## 2012-08-04 NOTE — Progress Notes (Signed)
Physical Therapy Treatment Patient Details Name: Sandra Brennan MRN: [id number] DOB: Jul 15, 1945 Today's Date: 08/04/2012 Time: 9562-1308 PT Time Calculation (min): 28 min  PT Assessment / Plan / Recommendation Comments on Treatment Session  pt sat in the chair for over 3 hours.  Gait and transfers were effortful due to the length of time pt has not walked.    Follow Up Recommendations  SNF     Does the patient have the potential to tolerate intense rehabilitation     Barriers to Discharge        Equipment Recommendations  Other (comment) (TBA post acute)    Recommendations for Other Services    Frequency 7X/week   Plan Discharge plan remains appropriate;Frequency remains appropriate    Precautions / Restrictions Precautions Precautions: Posterior Hip Required Braces or Orthoses: Knee Immobilizer - Left (in bed) Restrictions Weight Bearing Restrictions: No   Pertinent Vitals/Pain     Mobility  Bed Mobility Bed Mobility: Sitting - Scoot to Edge of Bed;Sit to Supine Supine to Sit: 1: +2 Total assist;HOB flat Sitting - Scoot to Edge of Bed: 3: Mod assist Sit to Supine: 1: +2 Total assist;HOB flat Sit to Supine: Patient Percentage: 40% Details for Bed Mobility Assistance: vc's for technique based on prec.  truncal and lower body assist Transfers Transfers: Sit to Stand;Stand to Sit Sit to Stand: 1: +2 Total assist;With upper extremity assist;From chair/3-in-1 Sit to Stand: Patient Percentage: 50% Stand to Sit: 1: +2 Total assist;With upper extremity assist;To bed Stand to Sit: Patient Percentage: 60% Stand Pivot Transfers:  (with RW) Details for Transfer Assistance: vc's for hand placement and significant lifting assist Ambulation/Gait Ambulation/Gait Assistance: 1: +2 Total assist (see transfer) Ambulation/Gait: Patient Percentage: 50% Ambulation Distance (Feet): 16 Feet Assistive device: Rolling walker Ambulation/Gait Assistance Details: step to gait, effortful  advancement of L LE, antalgic gait Gait Pattern: Step-to pattern;Decreased step length - right;Decreased step length - left;Decreased stance time - right;Antalgic;Trunk flexed Stairs: No Wheelchair Mobility Wheelchair Mobility: No    Exercises Total Joint Exercises Ankle Circles/Pumps: 20 reps;AROM;Both;Seated   PT Diagnosis:    PT Problem List:   PT Treatment Interventions:     PT Goals Acute Rehab PT Goals PT Goal Formulation: With patient Time For Goal Achievement: 08/18/12 Potential to Achieve Goals: Good Pt will go Supine/Side to Sit: with min assist PT Goal: Supine/Side to Sit - Progress: Progressing toward goal Pt will go Sit to Stand: with min assist PT Goal: Sit to Stand - Progress: Progressing toward goal Pt will Transfer Bed to Chair/Chair to Bed: with mod assist PT Transfer Goal: Bed to Chair/Chair to Bed - Progress: Progressing toward goal Pt will Ambulate: 16 - 50 feet;with mod assist;with rolling walker PT Goal: Ambulate - Progress: Progressing toward goal Pt will Perform Home Exercise Program: with min assist PT Goal: Perform Home Exercise Program - Progress: Progressing toward goal  Visit Information  Last PT Received On: 08/04/12 Assistance Needed: +2    Subjective Data  Subjective: I know this is going to hurt...it's going to be hard because I haven't been able to walk in 3 months Patient Stated Goal: Home, walking independent   Cognition  Cognition Overall Cognitive Status: Within Functional Limits for tasks assessed    Balance  Balance Balance Assessed: Yes Static Sitting Balance Static Sitting - Balance Support: Left upper extremity supported;Right upper extremity supported;Feet supported Static Sitting - Level of Assistance: 5: Stand by assistance  End of Session PT - End of Session Activity Tolerance:  Patient tolerated treatment well;Patient limited by pain Patient left: with call bell/phone within reach;in bed;with family/visitor  present Nurse Communication: Mobility status   GP     Daysia Vandenboom, Eliseo Gum 08/04/2012, 4:33 PM 08/04/2012  [place] Bing, PT [phone]  (pager)

## 2012-08-04 NOTE — Op Note (Signed)
NAMEMarland Brennan  Sandra, Brennan NO.:  [id number]  MEDICAL RECORD NO.:  [id number]  LOCATION:  [id number]                        FACILITY:  [facility]  PHYSICIAN:  Claude Manges. Teiara Baria, M.D.DATE OF BIRTH:  [birth year]  DATE OF PROCEDURE:  08/03/2012 DATE OF DISCHARGE:                              OPERATIVE REPORT   PREOPERATIVE DIAGNOSIS: 1. End-stage osteoarthritis, left hip. 2. Morbid obesity with BMI of 48.27.  POSTOPERATIVE DIAGNOSIS: 1. End-stage osteoarthritis, left hip. 2. Morbid obesity with BMI of 48.27.  PROCEDURES:  Left total hip replacement.  SURGEON:  Claude Manges. Cleophas Dunker, M.D.  ASSISTANT:  Arlys John D. Petrarca, P.A.-C.  ANESTHESIA:  General.  COMPLICATIONS:  None.  COMPONENTS:  DePuy AML 13.5 mm small stature femoral component, a 3.6 mm hip ball with a 5 mm neck length, 52 mm outer diameter, sector 3 metallic acetabular component with a single acetabular screw, apex hole eliminator, and a Marathon polyethylene liner with a 10-degree posterior lip.  Components were press-fit.  PROCEDURE IN DETAIL:  [name] was met in the holding area, identified the left hip as the appropriate operative site.  The leg lengths appeared to be symmetrical and motor exam appeared to be intact.  Any questions were answered.  The patient was then transported to room #7 and placed under general anesthesia without difficulty.  Nursing staff inserted a Foley catheter. Urine was clear. The patient was then placed in the lateral decubitus position, and secured to the operating table with the Innomed hip system.  The left lower extremity was prepped from the iliac crest to the ankle with chlorhexidine scrub and DuraPrep.  Sterile draping was performed.  Routine Southern incision was utilized and via sharp dissection, and carried down to subcutaneous tissue.  There is at least 4 inches of adipose tissue as the patient was morbidly obese.  The procedure was technically difficult  because of the size of the leg.  Adipose tissue was incised with a Bovie and hemostasis was carefully obtained.  Self- retaining retractors were inserted.  The iliotibial band was identified and incised along length of the skin incision.  With the hip internally rotated, the short external rotators were identified, muscular structures were then incised with the Bovie. Tendinous structures were tagged with 0 Ethibond suture.  The hip capsule was identified.  It was incised along with the femoral neck and head.  The joint was entered.  There was a very small clear yellow joint effusion.  The head was dislocated posteriorly and amputated approximately a fingerbreadth proximal to the lesser trochanter.  The head was then evaluated.  It was misshapen and there was very minimal articular cartilage remaining, and some areas of what looked like avascular necrosis with ping-pong affecting the head.  Retractors were then placed along the proximal femur.  A reaming hole was then made in the piriformis fossa.  Reaming was performed sequentially to 13 mm to accept a 13.5 mm femoral component.  Rasping was then performed sequentially to 13.5.  Calcar reamer was used to obtain the appropriate calcar angle.  Retractors were then placed about the acetabulum.  There were pieces of loose articular cartilage that were removed.  The labrum was sharply  excised.  Retractors were then placed along the acetabulum for visualization.  Reaming was performed sequentially at 51 mm to accept a 52-mm component. I checked a 50 mm and a 52 mm trial up to 52 with completely seated with a good rim fit.  The 52 would not completely seat.  The final acetabular component was then inserted with the 52 mm outer diameter sector 3 Gription cup.  The polyethylene component was inserted followed by the femoral rasp and the 36 mm hip ball with a +5 neck length.  The entire construct was reduced and the hip was stable, but  I thought there was relatively uncovered posteriorly, I removed all the components and then repositioned the acetabulum to allow more flexion. It had excellent fit and it was not loose, and then irrigated the wound and reinserted the trial polyethylene liner and then the femoral component, a 36-mm outer diameter hip ball with a 5 mm neck length, and this construct was perfectly stable and I thought it was nicely covered.  The trial components again were removed and irrigated the acetabulum.  I inserted an apex hole eliminator, and a single 25-mm acetabular screw after appropriate reaming and measuring with the acetabular screw with a nice tight fit, and a perfectly stable acetabulum.  The wound was again irrigated with saline solution.  The final Marathon polyethylene liner was then inserted.  I irrigated the femoral canal.  I then neck carefully impacted the femoral component flush on the calcar in about 15 degrees of anteversion.  We cleaned the [facility] taper neck and applied the 36 mm outer diameter hip ball with a +5 mm neck length.  I cleaned the acetabulum, reduced the entire construct.  Care was taken to check the sciatic nerve on multiple occasions and it was nicely protected throughout the procedure.  The leg was then placed through a full range of motion, I thought the leg lengths remained symmetrical and the hip was perfectly stable.  I injected the deep capsule with 0.25% Marcaine with epinephrine and then sprayed thrombin around the wound.  The deep capsule was closed anatomically with #1 Ethibond.  The short external rotator tendons were closed with similar material.  Wound was again irrigated with saline solution and applied thrombin to the deep adipose tissue.  The iliotibial band was closed with the running #1 Vicryl.  The adipose tissue and subcu were closed in numerous layers with 0 Vicryl, subcu with 3-0 Monocryl.  Skin was closed with skin clips.  Sterile bulky  dressing was applied.  The patient was then placed in the supine position.  Awoke and returned to the postanesthesia recovery room without complications.     Claude Manges. Cleophas Dunker, M.D.     PWW/MEDQ  D:  08/03/2012  T:  08/04/2012  Job:  [id number]

## 2012-08-04 NOTE — Progress Notes (Signed)
Utilization review completed.  

## 2012-08-04 NOTE — Clinical Social Work Psychosocial (Signed)
     Clinical Social Work Department BRIEF PSYCHOSOCIAL ASSESSMENT 08/04/2012  Patient:  Sandra Brennan, Sandra Brennan     Account Number:  [id number]     Admit date:  08/03/2012  Clinical Social Worker:  Hulan Fray  Date/Time:  08/04/2012 11:48 AM  Referred by:  Physician  Date Referred:  08/03/2012 Referred for  SNF Placement   Other Referral:   Interview type:  Patient Other interview type:    PSYCHOSOCIAL DATA Living Status:  FAMILY Admitted from facility:   Level of care:   Primary support name:  Harvel Ricks Primary support relationship to patient:  CHILD, ADULT Degree of support available:   supportive    CURRENT CONCERNS Current Concerns  Post-Acute Placement   Other Concerns:    SOCIAL WORK ASSESSMENT / PLAN Clinical Social Worker received referral for patient needing short term SNF placement at discharge. CSW introduced self and explaine reason for visit. Patient reported that she is agreeable for CSW to initiate referral in [facility]. 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 Patient reported her daughter works at [employer] and that would be second choice.    CSW will initiate SNF search in [facility] and will complete FL2 for MD's signature. CSW will update patient when bed offers are made.   Assessment/plan status:  Psychosocial Support/Ongoing Assessment of Needs Other assessment/ plan:   Information/referral to community resources:   SNF packet    PATIENTS/FAMILYS RESPONSE TO PLAN OF CARE: Patient is agreeable for CSW to intiate SNF search in [place] with preferences for [facility] as first choice and [facility]F as second choice.

## 2012-08-04 NOTE — Evaluation (Signed)
Physical Therapy Evaluation Patient Details Name: Sandra Brennan MRN: [id number] DOB: 14-Aug-1945 Today's Date: 08/04/2012 Time: 4098-1191 PT Time Calculation (min): 32 min  PT Assessment / Plan / Recommendation Clinical Impression  pt s/p L THA and complicated by weakness and pain with pt not having walked in ~3 months.  Pt can benefit from PT to maximize Independence.    PT Assessment  Patient needs continued PT services    Follow Up Recommendations  SNF    Does the patient have the potential to tolerate intense rehabilitation      Barriers to Discharge Decreased caregiver support      Equipment Recommendations  Other (comment) (TBA post acute)    Recommendations for Other Services     Frequency 7X/week    Precautions / Restrictions Precautions Precautions: Posterior Hip Required Braces or Orthoses: Knee Immobilizer - Left (in bed)   Pertinent Vitals/Pain       Mobility  Bed Mobility Bed Mobility: Supine to Sit;Sitting - Scoot to Edge of Bed Supine to Sit: 1: +2 Total assist;HOB flat Supine to Sit: Patient Percentage: 40% Sitting - Scoot to Edge of Bed: 3: Mod assist Details for Bed Mobility Assistance: vc's for technique based on prec.  truncal and lower body assist Transfers Transfers: Sit to Stand;Stand to Sit;Stand Pivot Transfers Sit to Stand: 1: +2 Total assist;With upper extremity assist;From bed Sit to Stand: Patient Percentage: 50% Stand to Sit: 1: +2 Total assist;With upper extremity assist;To chair/3-in-1 Stand to Sit: Patient Percentage: 50% Stand Pivot Transfers: 1: +2 Total assist;Other (comment) (with RW) Stand Pivot Transfers: Patient Percentage: 50% Details for Transfer Assistance: vc's for hand placement and significant lifting assist Ambulation/Gait Ambulation/Gait Assistance: Not tested (comment);Other (comment) (see transfer) Stairs: No Wheelchair Mobility Wheelchair Mobility: No    Exercises Total Joint Exercises Ankle Circles/Pumps: 20  reps;AROM;Both;Seated Quad Sets: AROM;Both;10 reps;Seated Heel Slides: Left;AAROM;10 reps;Supine Long Arc Quad: AROM;Both;15 reps;Seated   PT Diagnosis: Difficulty walking;Generalized weakness;Acute pain  PT Problem List: Decreased strength;Decreased range of motion;Decreased activity tolerance;Decreased mobility;Decreased knowledge of use of DME;Pain PT Treatment Interventions: DME instruction;Gait training;Functional mobility training;Therapeutic activities;Therapeutic exercise;Patient/family education   PT Goals Acute Rehab PT Goals PT Goal Formulation: With patient Time For Goal Achievement: 08/18/12 Potential to Achieve Goals: Good Pt will go Supine/Side to Sit: with min assist PT Goal: Supine/Side to Sit - Progress: Goal set today Pt will go Sit to Stand: with min assist PT Goal: Sit to Stand - Progress: Goal set today Pt will Transfer Bed to Chair/Chair to Bed: with mod assist PT Transfer Goal: Bed to Chair/Chair to Bed - Progress: Goal set today Pt will Ambulate: 16 - 50 feet;with mod assist;with rolling walker PT Goal: Ambulate - Progress: Goal set today Pt will Perform Home Exercise Program: with min assist PT Goal: Perform Home Exercise Program - Progress: Goal set today  Visit Information  Last PT Received On: 08/04/12 Assistance Needed: +2    Subjective Data  Subjective: I know this is going to hurt...it's going to be hard because I haven't been able to walk in 3 months Patient Stated Goal: Home, walking independent   Prior Functioning  Home Living Lives With: Daughter Available Help at Discharge: Available PRN/intermittently Type of Home: Apartment Home Access: Stairs to enter Entrance Stairs-Number of Steps: 1 Home Layout: One level Bathroom Shower/Tub: Forensic scientist: Handicapped height Bathroom Accessibility: Yes Home Adaptive Equipment: Grab bars around toilet;Grab bars in shower;Walker - rolling Prior Function Level of  Independence: Independent with assistive  device(s) Driving: No Vocation: Retired Musician: No difficulties    Copywriter, advertising Overall Cognitive Status: Within Functional Limits for tasks assessed    Extremity/Trunk Assessment Right Upper Extremity Assessment RUE ROM/Strength/Tone: WFL for tasks assessed Left Upper Extremity Assessment LUE ROM/Strength/Tone: WFL for tasks assessed Right Lower Extremity Assessment RLE ROM/Strength/Tone: WFL for tasks assessed (bil LE weakness, but L LE weaker and painful) Left Lower Extremity Assessment LLE ROM/Strength/Tone: WFL for tasks assessed;Deficits LLE ROM/Strength/Tone Deficits: grossly 3+/5, hip flexion 3/5   Balance Balance Balance Assessed: Yes Static Sitting Balance Static Sitting - Balance Support: Left upper extremity supported;Right upper extremity supported;Feet supported Static Sitting - Level of Assistance: 5: Stand by assistance  End of Session PT - End of Session Equipment Utilized During Treatment: Gait belt Activity Tolerance: Patient tolerated treatment well;Patient limited by pain Patient left: in chair;with call bell/phone within reach  GP     Kelcie Currie, Eliseo Gum 08/04/2012, 12:39 PM 08/04/2012  [place] Bing, PT [phone]  (pager)

## 2012-08-04 NOTE — Care Management Note (Signed)
  Page 1 of 1   08/04/2012     11:45:45 AM   CARE MANAGEMENT NOTE 08/04/2012  Patient:  Sandra Brennan, Sandra Brennan   Account Number:  [id number]  Date Initiated:  08/04/2012  Documentation initiated by:  Ronny Flurry  Subjective/Objective Assessment:     Action/Plan:   Anticipated DC Date:     Anticipated DC Plan:           Choice offered to / List presented to:        DME agency  [facility]     HH arranged  [facility] PT      [facility] agency  [facility]   Status of service:   Medicare Important Message given?   (If response is "NO", the following Medicare IM given date fields will be blank) Date Medicare IM given:   Date Additional Medicare IM given:    Discharge Disposition:    Per UR Regulation:    If discussed at Long Length of Stay Meetings, dates discussed:    Comments:  08-04-12 Patient pre set up with [place] . Patient and family have questions regarding SNF for rehab , will await PT/OT evals  Ronny Flurry RN BSN [phone]

## 2012-08-05 ENCOUNTER — Encounter (HOSPITAL_COMMUNITY): Payer: Self-pay | Admitting: Orthopaedic Surgery

## 2012-08-05 DIAGNOSIS — E1142 Type 2 diabetes mellitus with diabetic polyneuropathy: Secondary | ICD-10-CM | POA: Diagnosis present

## 2012-08-05 DIAGNOSIS — G4730 Sleep apnea, unspecified: Secondary | ICD-10-CM | POA: Diagnosis present

## 2012-08-05 DIAGNOSIS — J45909 Unspecified asthma, uncomplicated: Secondary | ICD-10-CM | POA: Diagnosis not present

## 2012-08-05 DIAGNOSIS — M1612 Unilateral primary osteoarthritis, left hip: Secondary | ICD-10-CM | POA: Diagnosis present

## 2012-08-05 DIAGNOSIS — I10 Essential (primary) hypertension: Secondary | ICD-10-CM | POA: Diagnosis present

## 2012-08-05 DIAGNOSIS — G4733 Obstructive sleep apnea (adult) (pediatric): Secondary | ICD-10-CM | POA: Diagnosis present

## 2012-08-05 LAB — BASIC METABOLIC PANEL
Chloride: 103 mEq/L (ref 96–112)
GFR calc Af Amer: 68 mL/min — ABNORMAL LOW (ref >90)
Potassium: 4.2 mEq/L (ref 3.5–5.1)

## 2012-08-05 LAB — CBC
HCT: 25 % — ABNORMAL LOW (ref 36.0–46.0)
Platelets: 181 10*3/uL (ref 150–400)
RBC: 2.9 MIL/uL — ABNORMAL LOW (ref 3.87–5.11)
RDW: 14 % (ref 11.5–15.5)
WBC: 17.6 10*3/uL — ABNORMAL HIGH (ref 4.0–10.5)

## 2012-08-05 LAB — TYPE AND SCREEN

## 2012-08-05 LAB — GLUCOSE, CAPILLARY
Glucose-Capillary: 116 mg/dL — ABNORMAL HIGH (ref 70–99)
Glucose-Capillary: 121 mg/dL — ABNORMAL HIGH (ref 70–99)

## 2012-08-05 MED ORDER — ACETAMINOPHEN 500 MG PO TABS: 1000.0000 mg | ORAL_TABLET | Freq: Four times a day (QID) | ORAL | Status: DC | PRN

## 2012-08-05 MED ORDER — METHOCARBAMOL 500 MG PO TABS: 500.0000 mg | ORAL_TABLET | Freq: Four times a day (QID) | ORAL | Status: DC | PRN

## 2012-08-05 MED ORDER — METFORMIN HCL 500 MG PO TABS
500.0000 mg | ORAL_TABLET | Freq: Every day | ORAL | Status: DC
  Administered 2012-08-05 – 2012-08-06 (×2): 500 mg via ORAL
  Filled 2012-08-05 (×3): qty 1

## 2012-08-05 MED ORDER — OXYCODONE HCL 5 MG PO TABS: 5.0000 mg | ORAL_TABLET | ORAL | Status: DC | PRN

## 2012-08-05 MED ORDER — METFORMIN HCL 500 MG PO TABS: 500.0000 mg | ORAL_TABLET | Freq: Every day | ORAL | Status: DC

## 2012-08-05 MED ORDER — RIVAROXABAN 10 MG PO TABS: 10.0000 mg | ORAL_TABLET | ORAL | Status: DC

## 2012-08-05 NOTE — Progress Notes (Signed)
Physical Therapy Treatment Patient Details Name: Sandra Brennan MRN: [id number] DOB: 19-Jan-1946 Today's Date: 08/05/2012 Time: 4098-1191 PT Time Calculation (min): 24 min  PT Assessment / Plan / Recommendation Comments on Treatment Session  Pt more painful and gait more effortful this p.m.  Likely due to immobility while in chair this afternoon.    Follow Up Recommendations  SNF     Does the patient have the potential to tolerate intense rehabilitation     Barriers to Discharge        Equipment Recommendations  Other (comment) (TBA post acute)    Recommendations for Other Services    Frequency 7X/week   Plan Discharge plan remains appropriate;Frequency remains appropriate    Precautions / Restrictions Precautions Precautions: Posterior Hip Required Braces or Orthoses:  (in bed) Restrictions Weight Bearing Restrictions: Yes LLE Weight Bearing: Weight bearing as tolerated   Pertinent Vitals/Pain 9/10 hip pain    Mobility  Bed Mobility Bed Mobility: Sitting - Scoot to Edge of Bed;Sit to Supine Sitting - Scoot to Edge of Bed: 4: Min assist Sit to Supine: 1: +2 Total assist;HOB flat Sit to Supine: Patient Percentage: 50% Scooting to HOB: 1: +2 Total assist Scooting to [facility]: Patient Percentage: 40% Details for Bed Mobility Assistance: vc's for technique based on prec.  truncal and lower body assist Transfers Transfers: Sit to Stand;Stand to Sit Sit to Stand: 1: +2 Total assist;With upper extremity assist;From chair/3-in-1;From toilet Sit to Stand: Patient Percentage: 40% Stand to Sit: 1: +2 Total assist;With upper extremity assist;To bed;To toilet Stand to Sit: Patient Percentage: 40% Stand Pivot Transfers:  (with RW) Details for Transfer Assistance: vc's for hand placement and significant lifting assist due to extra pain after sitting up so long Ambulation/Gait Ambulation/Gait Assistance: 1: +2 Total assist (see transfer) Ambulation/Gait: Patient Percentage:  60% Ambulation Distance (Feet): 15 Feet (times 2) Assistive device: Rolling walker Ambulation/Gait Assistance Details: antalgic , step to patter Gait Pattern: Step-to pattern;Decreased step length - right;Decreased step length - left;Decreased stance time - right;Antalgic;Trunk flexed Stairs: No Wheelchair Mobility Wheelchair Mobility: No    Exercises     PT Diagnosis:    PT Problem List:   PT Treatment Interventions:     PT Goals Acute Rehab PT Goals PT Goal Formulation: With patient Time For Goal Achievement: 08/18/12 Potential to Achieve Goals: Good Pt will go Supine/Side to Sit: with min assist PT Goal: Supine/Side to Sit - Progress: Progressing toward goal Pt will go Sit to Stand: with min assist PT Goal: Sit to Stand - Progress: Progressing toward goal Pt will Transfer Bed to Chair/Chair to Bed: with mod assist PT Transfer Goal: Bed to Chair/Chair to Bed - Progress: Progressing toward goal Pt will Ambulate: 16 - 50 feet;with mod assist;with rolling walker;51 - 150 feet;with min assist;with least restrictive assistive device PT Goal: Ambulate - Progress: Not met Pt will Perform Home Exercise Program: with min assist  Visit Information  Last PT Received On: 08/05/12 Assistance Needed: +2    Subjective Data  Subjective: Ohhh,  it hurts so bad.... Patient Stated Goal: Home, walking independent   Cognition  Cognition Arousal/Alertness: Awake/alert Behavior During Therapy: WFL for tasks assessed/performed Overall Cognitive Status: Within Functional Limits for tasks assessed    Balance  Balance Balance Assessed: Yes  End of Session PT - End of Session Activity Tolerance: Patient limited by pain Patient left: with call bell/phone within reach;in bed Nurse Communication: Mobility status   GP     Thadd Apuzzo, Eliseo Gum 08/05/2012,  6:19 PM 08/05/2012  [place] Bing, PT [phone]  (pager)

## 2012-08-05 NOTE — Progress Notes (Signed)
Progress Note from am   08/05/12 1100  PT Visit Information  Last PT Received On 08/05/12  Assistance Needed +2  PT Time Calculation  PT Start Time 0950  PT Stop Time 1020  PT Time Calculation (min) 30 min  Subjective Data  Subjective I'm ready, let's get on with it.  Patient Stated Goal Home, walking independent  Precautions  Precautions Posterior Hip  Required Braces or Orthoses (in bed)  Restrictions  LLE Weight Bearing WBAT  Cognition  Overall Cognitive Status Within Functional Limits for tasks assessed  Bed Mobility  Bed Mobility Supine to Sit;Sitting - Scoot to Edge of Bed  Supine to Sit 1: +2 Total assist;HOB flat  Supine to Sit: Patient Percentage 40%  Sitting - Scoot to Edge of Bed 4: Min assist  Details for Bed Mobility Assistance vc's for technique based on prec.  truncal and lower body assist  Transfers  Transfers Sit to Stand;Stand to Sit  Sit to Stand 1: +2 Total assist;With upper extremity assist;From bed  Sit to Stand: Patient Percentage 50%  Stand to Sit 1: +2 Total assist;With upper extremity assist;To chair/3-in-1  Stand to Sit: Patient Percentage 60%  Stand Pivot Transfers ( )  Details for Transfer Assistance vc's for hand placement and significant lifting assist  Ambulation/Gait  Ambulation/Gait Assistance 4: Min assist (see transfer)  Ambulation Distance (Feet) 30 Feet  Assistive device Rolling walker  Ambulation/Gait Assistance Details antalgic  Gait Pattern Step-to pattern;Decreased step length - right;Decreased step length - left;Decreased stance time - right;Antalgic;Trunk flexed  Stairs No  Engineering geologist No  Balance  Balance Assessed No  Static Sitting Balance  Static Sitting - Balance Support Feet supported;Bilateral upper extremity supported  Static Sitting - Level of Assistance 5: Stand by assistance  Exercises  Exercises Total Joint  Total Joint Exercises  Ankle Circles/Pumps 20 reps;AROM;Both;Seated  Quad  Sets AROM;Both;Seated;15 reps  Heel Slides Left;AAROM;10 reps;Supine  Short Arc Quad Strengthening;Left;10 reps;Supine  Hip ABduction/ADduction AAROM;Left;15 reps;Supine  PT - End of Session  Activity Tolerance Patient tolerated treatment well  Patient left in chair;with call bell/phone within reach  Nurse Communication Mobility status  PT - Assessment/Plan  Comments on Treatment Session Following hip precautions well.  Mobility still effortful.  PT Frequency 7X/week  Follow Up Recommendations SNF  PT equipment Other (comment) (TBA post acute)  Acute Rehab PT Goals  PT Goal Formulation With patient  Time For Goal Achievement 08/18/12  Potential to Achieve Goals Good  Pt will go Supine/Side to Sit with min assist  PT Goal: Supine/Side to Sit - Progress Progressing toward goal  Pt will go Sit to Stand with min assist  PT Goal: Sit to Stand - Progress Progressing toward goal  Pt will Transfer Bed to Chair/Chair to Bed with mod assist  PT Transfer Goal: Bed to Chair/Chair to Bed - Progress Progressing toward goal  Pt will Ambulate with rolling walker;51 - 150 feet;with min assist  PT Goal: Ambulate - Progress Updated due to goal met  Pt will Perform Home Exercise Program with min assist  PT Goal: Perform Home Exercise Program - Progress Progressing toward goal  PT General Charges  $$ ACUTE PT VISIT 1 Procedure  PT Treatments  $Gait Training 8-22 mins  $Therapeutic Exercise 8-22 mins   08/05/2012  [place] Bing, PT [phone]  (pager)

## 2012-08-05 NOTE — Clinical Social Work Note (Signed)
Clinical Social Worker met patient and provided list of bed offers received and patient confirmed [facility]. CSW left voice message at [facility] regarding confirmation of facility. CSW will continue to follow.   Rozetta Nunnery MSW, [employer] [phone]

## 2012-08-05 NOTE — Evaluation (Signed)
Occupational Therapy Evaluation Patient Details Name: Sandra Brennan MRN: [id number] DOB: [birth year] Today's Date: 08/05/2012 Time: 4098-1191 OT Time Calculation (min): 24 min  OT Assessment / Plan / Recommendation Clinical Impression   This 67 y.o. Female admitted for Lt. Posterior THA.  Pt presents to OT with increased pain, generalized weakness, decrease understanding of post. THA precautions and dependencies with BADLs.  SHe will benefit from continued OT at SNF to allow her to return to modified independent level.  Pt with planned discharge to SNF level rehab, and will defer further OT to SNF    OT Assessment  All further OT needs can be met in the next venue of care    Follow Up Recommendations  SNF;Supervision/Assistance - 24 hour    Barriers to Discharge      Equipment Recommendations  None recommended by OT    Recommendations for Other Services    Frequency       Precautions / Restrictions Precautions Precautions: Posterior Hip Required Braces or Orthoses: Knee Immobilizer - Left (in bed)       ADL  Eating/Feeding: Independent Where Assessed - Eating/Feeding: Chair Grooming: Wash/dry hands;Minimal assistance Where Assessed - Grooming: Supported standing Upper Body Bathing: Set up Where Assessed - Upper Body Bathing: Supported sitting Lower Body Bathing: Moderate assistance Where Assessed - Lower Body Bathing: Supported sit to stand Upper Body Dressing: Minimal assistance Where Assessed - Upper Body Dressing: Unsupported sitting Lower Body Dressing: Maximal assistance Where Assessed - Lower Body Dressing: Supported sit to stand Toilet Transfer: Minimal assistance Toilet Transfer Method: Sit to stand;Stand pivot Acupuncturist: Bedside commode;Grab bars Toileting - Clothing Manipulation and Hygiene: Maximal assistance Where Assessed - Engineer, mining and Hygiene: Standing Equipment Used: Rolling walker Transfers/Ambulation Related to  ADLs: min A ADL Comments: Pt able to recall 2/3 post THA precautions and requires min vc's to maintain during functional activities    OT Diagnosis: Generalized weakness;Acute pain  OT Problem List: Decreased strength;Decreased activity tolerance;Decreased knowledge of use of DME or AE;Decreased knowledge of precautions;Obesity;Pain OT Treatment Interventions:     OT Goals    Visit Information  Last OT Received On: 08/05/12 Assistance Needed: +2    Subjective Data  Subjective: "I'm just hurting" Patient Stated Goal: To get better   Prior Functioning     Home Living Lives With: Daughter Available Help at Discharge: Skilled Nursing Facility Type of Home: Apartment Home Access: Stairs to enter Secretary/administrator of Steps: 1 Home Layout: One level Bathroom Shower/Tub: Forensic scientist: Handicapped height Bathroom Accessibility: Yes Home Adaptive Equipment: Grab bars around toilet;Grab bars in shower;Walker - rolling Prior Function Level of Independence: Independent with assistive device(s) Driving: No Vocation: Retired Musician: No difficulties         Vision/Perception     Copywriter, advertising Arousal/Alertness: Awake/alert Behavior During Therapy: WFL for tasks assessed/performed Overall Cognitive Status: Within Functional Limits for tasks assessed    Extremity/Trunk Assessment Right Upper Extremity Assessment RUE ROM/Strength/Tone: WFL for tasks assessed RUE Coordination: WFL - gross/fine motor Left Upper Extremity Assessment LUE ROM/Strength/Tone: WFL for tasks assessed LUE Coordination: WFL - gross/fine motor Trunk Assessment Trunk Assessment: Normal     Mobility Bed Mobility Bed Mobility: Sit to Supine;Scooting to HOB Sit to Supine: 1: +2 Total assist;HOB flat;With rail Sit to Supine: Patient Percentage: 50% Scooting to HOB: 1: +2 Total assist Scooting to [facility]: Patient Percentage: 30% Details for Bed  Mobility Assistance: Vc's for technique and asssit to lift Lt.  LE and position self correctly Transfers Transfers: Sit to Stand;Stand to Sit Sit to Stand: 4: Min assist;With upper extremity assist;From chair/3-in-1;From toilet Stand to Sit: 4: Min assist;With upper extremity assist;To bed;To toilet Details for Transfer Assistance: vc's for hand placement and technique.  Assist to move into standing position     Exercise     Balance     End of Session OT - End of Session Activity Tolerance: Patient limited by pain Patient left: in bed;with call bell/phone within reach  GO     Americo Vallery M 08/05/2012, 3:06 PM

## 2012-08-05 NOTE — Progress Notes (Signed)
Patient ID: Sandra Brennan, female   DOB: 02/15/1946, 67 y.o.   MRN: [id number] PATIENT ID: Sandra Brennan        MRN:  [id number]          DOB/AGE: Jul 22, 1945 / 67 y.o.    Sandra Campbell, MD   Sandra Code, PA-C [address] [place], [place]  [address]                             [phone]   PROGRESS NOTE  Subjective:  negative for Chest Pain  negative for Shortness of Breath  negative for Nausea/Vomiting   negative for Calf Pain    Tolerating Diet: yes         Patient reports pain as mild.     Past 24 hrs "better"..pain controlled, slow progress in PT  Objective: Vital signs in last 24 hours:   Patient Vitals for the past 24 hrs:  BP Temp Temp src Pulse Resp SpO2  08/05/12 0557 101/58 mmHg 99.4 F (37.4 C) Oral 110 18 100 %  08/04/12 2121 110/51 mmHg 100 F (37.8 C) Oral 123 18 100 %  08/04/12 1724 132/72 mmHg 99.2 F (37.3 C) Oral 123 18 95 %  08/04/12 1621 110/66 mmHg 99.4 F (37.4 C) Oral 112 20 -  08/04/12 1500 115/63 mmHg 99.7 F (37.6 C) Oral 114 18 -  08/04/12 1400 112/65 mmHg 99.7 F (37.6 C) Oral 119 18 -  08/04/12 1310 110/61 mmHg 99.5 F (37.5 C) Oral 116 22 -  08/04/12 1235 104/54 mmHg 99.5 F (37.5 C) - 119 20 99 %      Intake/Output from previous day:   06/04 0701 - 06/05 0700 In: 2489.5 [I.V.:1839.5] Out: 2700 [Urine:2700]   Intake/Output this shift:   06/05 0701 - 06/05 1900 In: -  Out: 1200 [Urine:1200]   Intake/Output     06/04 0701 - 06/05 0700 06/05 0701 - 06/06 0700   I.V. (mL/kg) 1839.5 (17.8)    Blood 650    IV Piggyback     Total Intake(mL/kg) 2489.5 (24.1)    Urine (mL/kg/hr) 2700 (1.1) 1200 (2.3)   Blood     Total Output 2700 1200   Net -210.5 -1200           LABORATORY DATA:  Recent Labs  08/03/12 1226 08/03/12 1620 08/04/12 0516 08/04/12 1832 08/05/12 0530  WBC  --  10.8* 10.6* 16.7* 17.6*  HGB 9.9* 8.0* 7.4* 9.5* 8.5*  HCT 29.0* 24.4* 22.6* 28.5* 25.0*  PLT  --  203 182 205 181    Recent Labs  08/03/12 1226 08/04/12 0516 08/05/12 0530  NA 140 136 137  K 3.7 4.3 4.2  CL  --  104 103  CO2  --  25 27  BUN  --  7 10  CREATININE  --  0.78 0.99  GLUCOSE 133* 125* 124*  CALCIUM  --  8.3* 8.4   Lab Results  Component Value Date   INR 1.07 07/28/2012    Recent Radiographic Studies :   Chest 2 View  07/28/2012   *RADIOLOGY REPORT*  Clinical Data: Preoperative examination (left total hip replacement)  CHEST - 2 VIEW  Comparison: 06/04/2002  Findings:  The examination is degraded secondary to patient body habitus.  Grossly unchanged borderline enlarged cardiac silhouette and mediastinal contours given reduced lung volumes.  Evaluation of the retrosternal clear space is obscured secondary to overlying soft tissues.  No focal airspace opacities.  No pleural effusion or pneumothorax.  No definite evidence of edema.  Multilevel thoracic spine degenerative change.  Age indeterminate mild (approximately 25%) compression deformity of a lower thoracic vertebral body with associated focal kyphosis at this level. Marked degenerative change and irregularity of the bilateral humeral heads, possibly the sequela of avascular necrosis.  IMPRESSION: 1.  Degraded examination without acute cardiopulmonary disease. 2.  Query avascular necrosis involving the bilateral humeral heads. 3.  Age indeterminate mild (approximately 25%) compression deformity of a lower thoracic vertebral body with associated focal kyphosis at this level.  Correlation for point tenderness at this location is recommended.   Original Report Authenticated By: Tacey Ruiz, MD   Dg Ankle Complete Right  07/25/2012   *RADIOLOGY REPORT*  Clinical Data: Severe right ankle pain.  No recent injuries. Current history of arthritis in the ankle.  RIGHT ANKLE - COMPLETE 3+ VIEW  Comparison: None.  Findings: Complete loss of the joint space with extensive hypertrophic spurring and calcified loose bodies within the joint space.  No evidence of acute  fracture or dislocation.  Severe degenerative changes in the visualized midfoot.  Moderately large plantar calcaneal spur.  IMPRESSION: Severe degenerative changes, query Charcot joint.  Calcified loose bodies within the joint.  No acute osseous abnormality.   Original Report Authenticated By: Hulan Saas, M.D.   Dg Pelvis Portable  08/03/2012   *RADIOLOGY REPORT*  Clinical Data: Postoperative radiographs.  Left total hip arthroplasty.  PORTABLE PELVIS  Comparison: 07/14/2007.  Findings: New left total hip arthroplasties present.  There are no complicating features.  There is a acetabular screw extending through the cup.  Expected postoperative changes in the soft tissues.  Mild to moderate right hip osteoarthritis.  IMPRESSION: Uncomplicated new left total hip arthroplasty.   Original Report Authenticated By: Andreas Newport, M.D.   Dg Hip Portable 1 View Left  08/03/2012   *RADIOLOGY REPORT*  Clinical Data: Postoperative radiographs.  Left total hip arthroplasty.  PORTABLE LEFT HIP - 1 VIEW  Comparison: Frontal view of the hip.  Today.  Findings: New left total hip arthroplasty appears located.  No complicating features.  Technically suboptimal lateral radiograph.  IMPRESSION: Uncomplicated new left total hip arthroplasty.   Original Report Authenticated By: Andreas Newport, M.D.     Examination:  General appearance: alert, cooperative, no distress and morbidly obese  Wound Exam: clean, dry, intact   Drainage:  None: wound tissue dry  Motor Exam: EHL, FHL, Anterior Tibial and Posterior Tibial Intact  Sensory Exam: Superficial Peroneal, Deep Peroneal and Tibial normal  Vascular Exam: Normal  Assessment:    2 Days Post-Op  Procedure(s) (LRB): TOTAL HIP ARTHROPLASTY (Left)  ADDITIONAL DIAGNOSIS:  Active Problems:   * No active hospital problems. *  Acute Blood Loss Anemia-2 units packed cells replaced   Plan: Physical Therapy as ordered Weight Bearing as Tolerated (WBAT)  DVT  Prophylaxis:  Xarelto  DISCHARGE PLAN: Skilled Nursing Facility/Rehab  DISCHARGE NEEDS: HHPT and Walker    OOB with PT, rehab placement, foley out, dressing changed     Sandra Brennan 08/05/2012 12:03 PM

## 2012-08-06 DIAGNOSIS — I10 Essential (primary) hypertension: Secondary | ICD-10-CM | POA: Diagnosis not present

## 2012-08-06 DIAGNOSIS — R279 Unspecified lack of coordination: Secondary | ICD-10-CM | POA: Diagnosis not present

## 2012-08-06 DIAGNOSIS — D62 Acute posthemorrhagic anemia: Secondary | ICD-10-CM | POA: Diagnosis not present

## 2012-08-06 DIAGNOSIS — M109 Gout, unspecified: Secondary | ICD-10-CM | POA: Diagnosis not present

## 2012-08-06 DIAGNOSIS — M545 Low back pain, unspecified: Secondary | ICD-10-CM | POA: Diagnosis present

## 2012-08-06 DIAGNOSIS — G4730 Sleep apnea, unspecified: Secondary | ICD-10-CM | POA: Diagnosis not present

## 2012-08-06 DIAGNOSIS — J811 Chronic pulmonary edema: Secondary | ICD-10-CM | POA: Diagnosis not present

## 2012-08-06 DIAGNOSIS — K219 Gastro-esophageal reflux disease without esophagitis: Secondary | ICD-10-CM | POA: Diagnosis present

## 2012-08-06 DIAGNOSIS — G909 Disorder of the autonomic nervous system, unspecified: Secondary | ICD-10-CM | POA: Diagnosis not present

## 2012-08-06 DIAGNOSIS — E119 Type 2 diabetes mellitus without complications: Secondary | ICD-10-CM | POA: Diagnosis not present

## 2012-08-06 DIAGNOSIS — S79919A Unspecified injury of unspecified hip, initial encounter: Secondary | ICD-10-CM | POA: Diagnosis not present

## 2012-08-06 DIAGNOSIS — M25519 Pain in unspecified shoulder: Secondary | ICD-10-CM | POA: Diagnosis not present

## 2012-08-06 DIAGNOSIS — M25559 Pain in unspecified hip: Secondary | ICD-10-CM | POA: Diagnosis not present

## 2012-08-06 DIAGNOSIS — B9689 Other specified bacterial agents as the cause of diseases classified elsewhere: Secondary | ICD-10-CM | POA: Diagnosis present

## 2012-08-06 DIAGNOSIS — Z79899 Other long term (current) drug therapy: Secondary | ICD-10-CM | POA: Diagnosis not present

## 2012-08-06 DIAGNOSIS — Z6841 Body Mass Index (BMI) 40.0 and over, adult: Secondary | ICD-10-CM | POA: Diagnosis not present

## 2012-08-06 DIAGNOSIS — T8140XA Infection following a procedure, unspecified, initial encounter: Secondary | ICD-10-CM | POA: Diagnosis not present

## 2012-08-06 DIAGNOSIS — Z87891 Personal history of nicotine dependence: Secondary | ICD-10-CM | POA: Diagnosis not present

## 2012-08-06 DIAGNOSIS — Z96649 Presence of unspecified artificial hip joint: Secondary | ICD-10-CM | POA: Diagnosis not present

## 2012-08-06 DIAGNOSIS — M1990 Unspecified osteoarthritis, unspecified site: Secondary | ICD-10-CM | POA: Diagnosis present

## 2012-08-06 DIAGNOSIS — I252 Old myocardial infarction: Secondary | ICD-10-CM | POA: Diagnosis not present

## 2012-08-06 DIAGNOSIS — M169 Osteoarthritis of hip, unspecified: Secondary | ICD-10-CM | POA: Diagnosis not present

## 2012-08-06 DIAGNOSIS — Z96659 Presence of unspecified artificial knee joint: Secondary | ICD-10-CM | POA: Diagnosis not present

## 2012-08-06 DIAGNOSIS — L02419 Cutaneous abscess of limb, unspecified: Secondary | ICD-10-CM | POA: Diagnosis not present

## 2012-08-06 DIAGNOSIS — R269 Unspecified abnormalities of gait and mobility: Secondary | ICD-10-CM | POA: Diagnosis not present

## 2012-08-06 DIAGNOSIS — J45909 Unspecified asthma, uncomplicated: Secondary | ICD-10-CM | POA: Diagnosis not present

## 2012-08-06 DIAGNOSIS — IMO0002 Reserved for concepts with insufficient information to code with codable children: Secondary | ICD-10-CM | POA: Diagnosis present

## 2012-08-06 DIAGNOSIS — M810 Age-related osteoporosis without current pathological fracture: Secondary | ICD-10-CM | POA: Diagnosis present

## 2012-08-06 DIAGNOSIS — M818 Other osteoporosis without current pathological fracture: Secondary | ICD-10-CM | POA: Diagnosis not present

## 2012-08-06 DIAGNOSIS — G8929 Other chronic pain: Secondary | ICD-10-CM | POA: Diagnosis not present

## 2012-08-06 DIAGNOSIS — L03119 Cellulitis of unspecified part of limb: Secondary | ICD-10-CM | POA: Diagnosis not present

## 2012-08-06 DIAGNOSIS — Z471 Aftercare following joint replacement surgery: Secondary | ICD-10-CM | POA: Diagnosis not present

## 2012-08-06 DIAGNOSIS — M6281 Muscle weakness (generalized): Secondary | ICD-10-CM | POA: Diagnosis not present

## 2012-08-06 LAB — BASIC METABOLIC PANEL
GFR calc Af Amer: 73 mL/min — ABNORMAL LOW (ref >90)
GFR calc non Af Amer: 63 mL/min — ABNORMAL LOW (ref >90)
Glucose, Bld: 99 mg/dL (ref 70–99)
Potassium: 3.9 mEq/L (ref 3.5–5.1)
Sodium: 136 mEq/L (ref 135–145)

## 2012-08-06 LAB — CBC
Hemoglobin: 7.9 g/dL — ABNORMAL LOW (ref 12.0–15.0)
MCHC: 33.5 g/dL (ref 30.0–36.0)
RDW: 13.8 % (ref 11.5–15.5)

## 2012-08-06 NOTE — Discharge Summary (Signed)
[name], MD   Jacqualine Code, PA-C [address], [place], [place]  [address]                             [phone]  PATIENT ID: Sandra Brennan        MRN:  [id number]          DOB/AGE: [birth year] / 67 y.o.    DISCHARGE SUMMARY  ADMISSION DATE:    2012/08/20 DISCHARGE DATE:   08/06/2012   ADMISSION DIAGNOSIS: Left Hip Osteoarthritis    DISCHARGE DIAGNOSIS:  Left Hip Osteoarthritis    ADDITIONAL DIAGNOSIS: Principal Problem:   Osteoarthritis of left hip Active Problems:   Diabetes mellitus type 2 in obese   Hypertension   Asthma, chronic   Sleep apnea  Past Medical History  Diagnosis Date  . Hypertension   . Asthma   . Shortness of breath     when stressed.  . Myocardial infarction 1992    20-Aug-2012 "mild MI when son died "  . GERD (gastroesophageal reflux disease)   . Sleep apnea     "never did fix my machine; haven't used one for 5 years; I've lost 116# since then; no problems now" (08-20-12)  . Type II diabetes mellitus     "borderline; don't test; take Metformin" (Aug 20, 2012)  . Anemia   . Migraines     "used to; totally stopped when I quit drinking 30 yr ago" (2012/08/20)  . Arthritis     "plenty" (August 20, 2012)  . Degenerative arthritis     "all over" (20-Aug-2012)  . Osteoporosis     "all over" (2012/08/20)  . Chronic lower back pain     "they say I need a whole new spinal column" (2012/08/20)    PROCEDURE: Procedure(s): TOTAL HIP ARTHROPLASTY Lefton 2012-08-20  CONSULTS: none     HISTORY: [name] is a very pleasant 67 year old widowed female who is seen today for evaluation of her left hip and groin pain. She has had bilateral joint replacements in the knees and there may be some question of whether there is loosening of the tibial plateaus in the knee component. However, her biggest complaints were in the left groin and lateral aspect of her hip. She was seen last in April of this year and she is now having to use a cane. Her mobilization is limited and her  activities of daily living have actually been quite compromised because of this pain and discomfort. She has difficulty with sleeping. She has had a corticosteroid injection to the hip in January which gave her several days of relief but was not long lasting. She has tried gabapentin as well as narcotics. She had been noted to have a x-rays of peripheral osteophytes. She is feet 6 inches and weight is 236 pounds and has been trying to lose weight and continues to do so. She at one point had weighed over 334 pounds.   HOSPITAL COURSE:  Sandra Brennan is a 67 y.o. admitted on 2012/08/20 and found to have a diagnosis of Left Hip Osteoarthritis.  After appropriate laboratory studies were obtained  they were taken to the operating room on 08-20-12 and underwent  Procedure(s): TOTAL HIP ARTHROPLASTY Left.   They were given perioperative antibiotics:  Anti-infectives   Start     Dose/Rate Route Frequency Ordered Stop   2012-08-20 2200  vancomycin (VANCOCIN) IVPB 1000 mg/200 mL premix     1,000 mg 200 mL/hr over  60 Minutes Intravenous Every 12 hours 08/03/12 1716 08/03/12 2313   08/03/12 0600  vancomycin (VANCOCIN) 1,500 mg in sodium chloride 0.9 % 500 mL IVPB     1,500 mg 250 mL/hr over 120 Minutes Intravenous On call to O.R. 08/02/12 1414 08/03/12 1022    . Given Tolerated the procedure well.  Placed with a foley intraoperatively.   POD #1, allowed out of bed to a chair.  PT for ambulation and exercise program.   IV saline KVO.  O2 discontionued. 2 units PRBC's  POD #2, continued PT and ambulation.  Foley was d/c'd.  Apparently order was stated to be completed on POD #1 but the actual removal was not performed by nursing. Marland Kitchen POD #3, continued with PT  The remainder of the hospital course was dedicated to ambulation and strengthening.   The patient was discharged on 3 Days Post-Op in  Stable condition.  Blood products given:2 units CC PRBC  DIAGNOSTIC STUDIES: Recent vital signs: Patient Vitals  for the past 24 hrs:  BP Temp Temp src Pulse Resp SpO2  08/06/12 0519 117/59 mmHg 99.7 F (37.6 C) Oral 99 20 100 %  08/06/12 0323 - - - - 19 94 %  08/06/12 0000 - - - - 17 96 %  08/05/12 2236 - 98.7 F (37.1 C) - - - -  08/05/12 2114 103/57 mmHg 100.4 F (38 C) Oral 115 18 93 %  08/05/12 2000 - - - - 18 96 %  08/05/12 1443 102/55 mmHg 100 F (37.8 C) - 103 18 98 %       Recent laboratory studies:  Recent Labs  08/03/12 1226 08/03/12 1620 08/04/12 0516 08/04/12 1832 08/05/12 0530 08/06/12 0510  WBC  --  10.8* 10.6* 16.7* 17.6* 15.5*  HGB 9.9* 8.0* 7.4* 9.5* 8.5* 7.9*  HCT 29.0* 24.4* 22.6* 28.5* 25.0* 23.6*  PLT  --  203 182 205 181 180    Recent Labs  08/03/12 1226 08/04/12 0516 08/05/12 0530  NA 140 136 137  K 3.7 4.3 4.2  CL  --  104 103  CO2  --  25 27  BUN  --  7 10  CREATININE  --  0.78 0.99  GLUCOSE 133* 125* 124*  CALCIUM  --  8.3* 8.4   Lab Results  Component Value Date   INR 1.07 07/28/2012     Recent Radiographic Studies :   Chest 2 View  07/28/2012   *RADIOLOGY REPORT*  Clinical Data: Preoperative examination (left total hip replacement)  CHEST - 2 VIEW  Comparison: 06/04/2002  Findings:  The examination is degraded secondary to patient body habitus.  Grossly unchanged borderline enlarged cardiac silhouette and mediastinal contours given reduced lung volumes.  Evaluation of the retrosternal clear space is obscured secondary to overlying soft tissues.  No focal airspace opacities.  No pleural effusion or pneumothorax.  No definite evidence of edema.  Multilevel thoracic spine degenerative change.  Age indeterminate mild (approximately 25%) compression deformity of a lower thoracic vertebral body with associated focal kyphosis at this level. Marked degenerative change and irregularity of the bilateral humeral heads, possibly the sequela of avascular necrosis.  IMPRESSION: 1.  Degraded examination without acute cardiopulmonary disease. 2.  Query avascular  necrosis involving the bilateral humeral heads. 3.  Age indeterminate mild (approximately 25%) compression deformity of a lower thoracic vertebral body with associated focal kyphosis at this level.  Correlation for point tenderness at this location is recommended.   Original Report Authenticated By: Jonny Ruiz  Judithann Sheen, MD   Dg Ankle Complete Right  07/25/2012   *RADIOLOGY REPORT*  Clinical Data: Severe right ankle pain.  No recent injuries. Current history of arthritis in the ankle.  RIGHT ANKLE - COMPLETE 3+ VIEW  Comparison: None.  Findings: Complete loss of the joint space with extensive hypertrophic spurring and calcified loose bodies within the joint space.  No evidence of acute fracture or dislocation.  Severe degenerative changes in the visualized midfoot.  Moderately large plantar calcaneal spur.  IMPRESSION: Severe degenerative changes, query Charcot joint.  Calcified loose bodies within the joint.  No acute osseous abnormality.   Original Report Authenticated By: Hulan Saas, M.D.   Dg Pelvis Portable  08/03/2012   *RADIOLOGY REPORT*  Clinical Data: Postoperative radiographs.  Left total hip arthroplasty.  PORTABLE PELVIS  Comparison: 07/14/2007.  Findings: New left total hip arthroplasties present.  There are no complicating features.  There is a acetabular screw extending through the cup.  Expected postoperative changes in the soft tissues.  Mild to moderate right hip osteoarthritis.  IMPRESSION: Uncomplicated new left total hip arthroplasty.   Original Report Authenticated By: Andreas Newport, M.D.   Dg Hip Portable 1 View Left  08/03/2012   *RADIOLOGY REPORT*  Clinical Data: Postoperative radiographs.  Left total hip arthroplasty.  PORTABLE LEFT HIP - 1 VIEW  Comparison: Frontal view of the hip.  Today.  Findings: New left total hip arthroplasty appears located.  No complicating features.  Technically suboptimal lateral radiograph.  IMPRESSION: Uncomplicated new left total hip arthroplasty.    Original Report Authenticated By: Andreas Newport, M.D.    DISCHARGE INSTRUCTIONS: Discharge Orders   Future Orders Complete By Expires     Call MD / Call 911  As directed     Comments:      If you experience chest pain or shortness of breath, CALL 911 and be transported to the hospital emergency room.  If you develope a fever above 101 F, pus (white drainage) or increased drainage or redness at the wound, or calf pain, call your surgeon's office.    Change dressing  As directed     Comments:      You may change your dressing on Monday, then change the dressing daily with sterile 4 x 4 inch gauze dressing and paper tape.  You may clean the incision with alcohol prior to redressing    Constipation Prevention  As directed     Comments:      Drink plenty of fluids.  Prune juice and/or coffee may be helpful.  You may use a stool softener, such as Colace (over the counter) 100 mg twice a day.  Use MiraLax (over the counter) for constipation as needed but this may take several days to work.  Mag Citrate --OR-- Milk of Magnesia may also be used but follow directions on the label.    Diet Carb Modified  As directed     Driving restrictions  As directed     Comments:      No driving for 6 weeks    Follow the hip precautions as taught in Physical Therapy  As directed     Increase activity slowly as tolerated  As directed     Lifting restrictions  As directed     Comments:      No lifting for 6 weeks    Patient may shower  As directed     Comments:      You may shower over the brown dressing.  Once the dressing is removed you may shower without a dressing once there is no drainage.  Do not wash over the wound.  If drainage remains, cover wound with plastic wrap and then shower.    TED hose  As directed     Comments:      Use stockings (TED hose) for 1-2 weeks on operative leg(s).  You may remove them at night for sleeping. May stop the NON-operative leg stocking when you go home.    Weight bearing  as tolerated  As directed        DISCHARGE MEDICATIONS:     Medication List    STOP taking these medications       ciprofloxacin 500 MG tablet  Commonly known as:  CIPRO     HYDROcodone-acetaminophen 5-325 MG per tablet  Commonly known as:  NORCO/VICODIN     TYLENOL ARTHRITIS PAIN PO      TAKE these medications       acetaminophen 500 MG tablet  Commonly known as:  TYLENOL  Take 2 tablets (1,000 mg total) by mouth every 6 (six) hours as needed for pain.     albuterol 108 (90 BASE) MCG/ACT inhaler  Commonly known as:  PROVENTIL HFA;VENTOLIN HFA  Inhale 2 puffs into the lungs every 6 (six) hours as needed for wheezing.     cycloSPORINE 0.05 % ophthalmic emulsion  Commonly known as:  RESTASIS  Place 1 drop into both eyes 2 (two) times daily.     gabapentin 800 MG tablet  Commonly known as:  NEURONTIN  Take 800 mg by mouth 2 (two) times daily.     metFORMIN 500 MG (MOD) 24 hr tablet  Commonly known as:  GLUMETZA  Take 500 mg by mouth daily with breakfast.     methocarbamol 500 MG tablet  Commonly known as:  ROBAXIN  Take 1 tablet (500 mg total) by mouth every 6 (six) hours as needed (spasms).     omeprazole 40 MG capsule  Commonly known as:  PRILOSEC  Take 40 mg by mouth daily.     oxyCODONE 5 MG immediate release tablet  Commonly known as:  Oxy IR/ROXICODONE  Take 1-2 tablets (5-10 mg total) by mouth every 4 (four) hours as needed for pain.     potassium chloride SA 20 MEQ tablet  Commonly known as:  K-DUR,KLOR-CON  Take 20 mEq by mouth 2 (two) times daily.     rivaroxaban 10 MG Tabs tablet  Commonly known as:  XARELTO  Take 1 tablet (10 mg total) by mouth daily.     triamterene-hydrochlorothiazide 37.5-25 MG per tablet  Commonly known as:  MAXZIDE-25  Take 1 tablet by mouth daily.        FOLLOW UP VISIT:       Follow-up Information   Follow up with [facility], Claude Manges, MD. Schedule an appointment as soon as possible for a visit on 08/16/2012.    Contact information:   [address] [place] [address] [phone]       DISPOSITION:   Skilled Nursing Facility/Rehab  CONDITION:  Stable   PETRARCA,BRIAN 08/06/2012, 6:26 AM

## 2012-08-06 NOTE — Progress Notes (Signed)
Physical Therapy Treatment Patient Details Name: Sandra Brennan MRN: [id number] DOB: Feb 03, 1946 Today's Date: 08/06/2012 Time: 4098-1191 PT Time Calculation (min): 28 min  PT Assessment / Plan / Recommendation Comments on Treatment Session  Following hip precautions well except L LE roll inward some when moving from supine to sit.  Mobility less effortful.  Expect steady improvement    Follow Up Recommendations  SNF     Does the patient have the potential to tolerate intense rehabilitation     Barriers to Discharge        Equipment Recommendations  Other (comment) (TBA post acute)    Recommendations for Other Services    Frequency 7X/week   Plan Discharge plan remains appropriate;Frequency remains appropriate    Precautions / Restrictions Precautions Precautions: Posterior Hip Required Braces or Orthoses:  (in bed) Restrictions LLE Weight Bearing: Weight bearing as tolerated   Pertinent Vitals/Pain 9/10 mostly in her feet    Mobility  Bed Mobility Bed Mobility: Supine to Sit;Sitting - Scoot to Edge of Bed Supine to Sit: 3: Mod assist;HOB elevated Sitting - Scoot to Edge of Bed: 4: Min guard Details for Bed Mobility Assistance: vc's for technique based on prec.  truncal and lower body assist Transfers Transfers: Sit to Stand;Stand to Sit;Stand Pivot Transfers Sit to Stand: With upper extremity assist;From bed;3: Mod assist Stand to Sit: With upper extremity assist;To chair/3-in-1;4: Min assist Stand Pivot Transfers: 3: Mod assist ( ) Stand Pivot Transfers: Patient Percentage: 70% Details for Transfer Assistance: vc's for hand placement and hip prec.  Lifting assist Ambulation/Gait Ambulation/Gait Assistance: 4: Min guard ( ) Ambulation Distance (Feet): 30 Feet Assistive device: Rolling walker Ambulation/Gait Assistance Details: good sequencing, less antalgic, small step length Gait Pattern: Step-to pattern;Decreased step length - right;Decreased step length -  left;Decreased stance time - right;Antalgic;Trunk flexed Stairs: No Wheelchair Mobility Wheelchair Mobility: No    Exercises Total Joint Exercises Ankle Circles/Pumps: 20 reps;AROM;Both;Seated Quad Sets: AROM;Both;Seated;10 reps Short Arc Quad: Strengthening;Left;10 reps;Supine Heel Slides: Left;10 reps;Supine;AROM Hip ABduction/ADduction: AAROM;Left;15 reps;Supine   PT Diagnosis:    PT Problem List:   PT Treatment Interventions:     PT Goals Acute Rehab PT Goals PT Goal Formulation: With patient Time For Goal Achievement: 08/18/12 Potential to Achieve Goals: Good Pt will go Supine/Side to Sit: with min assist PT Goal: Supine/Side to Sit - Progress: Progressing toward goal Pt will go Sit to Stand: with min assist PT Goal: Sit to Stand - Progress: Progressing toward goal Pt will Transfer Bed to Chair/Chair to Bed: with mod assist PT Transfer Goal: Bed to Chair/Chair to Bed - Progress: Progressing toward goal Pt will Ambulate: with rolling walker;51 - 150 feet;with min assist PT Goal: Ambulate - Progress: Progressing toward goal Pt will Perform Home Exercise Program: with min assist PT Goal: Perform Home Exercise Program - Progress: Met  Visit Information  Last PT Received On: 08/06/12 Assistance Needed: +2    Subjective Data  Subjective: I'm going to [place] today Patient Stated Goal: Home, walking independent   Cognition  Cognition Arousal/Alertness: Awake/alert Behavior During Therapy: WFL for tasks assessed/performed Overall Cognitive Status: Within Functional Limits for tasks assessed    Balance  Balance Balance Assessed: No  End of Session PT - End of Session Activity Tolerance: Patient tolerated treatment well Patient left: in chair;with call bell/phone within reach Nurse Communication: Mobility status   GP     Sandra Brennan, Sandra Brennan 08/06/2012, 12:27 PM 08/06/2012  Sandra Brennan, PT [phone]  (pager)

## 2012-08-06 NOTE — Progress Notes (Signed)
Patient ID: Sandra Brennan, female   DOB: 1946/02/12, 67 y.o.   MRN: [id number] PATIENT ID: Sandra Brennan        MRN:  [id number]          DOB/AGE: 17-Oct-1945 / 67 y.o.    Sandra Campbell, MD   Sandra Code, PA-C [address] [place] [place], [place]  [address]                             [phone]   PROGRESS NOTE  Subjective:  negative for Chest Pain  negative for Shortness of Breath  negative for Nausea/Vomiting   negative for Calf Pain    Tolerating Diet: yes         Patient reports pain as mild.     Much better night, without SOB, minimal pain  Objective: Vital signs in last 24 hours:   Patient Vitals for the past 24 hrs:  BP Temp Temp src Pulse Resp SpO2  08/06/12 0519 117/59 mmHg 99.7 F (37.6 C) Oral 99 20 100 %  08/06/12 0323 - - - - 19 94 %  08/06/12 0000 - - - - 17 96 %  08/05/12 2236 - 98.7 F (37.1 C) - - - -  08/05/12 2114 103/57 mmHg 100.4 F (38 C) Oral 115 18 93 %  08/05/12 2000 - - - - 18 96 %  08/05/12 1443 102/55 mmHg 100 F (37.8 C) - 103 18 98 %      Intake/Output from previous day:   06/05 0701 - 06/06 0700 In: 610 [P.O.:610] Out: 1200 [Urine:1200]   Intake/Output this shift:       Intake/Output     06/05 0701 - 06/06 0700 06/06 0701 - 06/07 0700   P.O. 610    I.V. (mL/kg)     Blood     Total Intake(mL/kg) 610 (5.9)    Urine (mL/kg/hr) 1200 (0.5)    Total Output 1200     Net -590          Urine Occurrence 4 x    Stool Occurrence 1 x    Emesis Occurrence 1 x       LABORATORY DATA:  Recent Labs  08/03/12 1226 08/03/12 1620 08/04/12 0516 08/04/12 1832 08/05/12 0530 08/06/12 0510  WBC  --  10.8* 10.6* 16.7* 17.6* 15.5*  HGB 9.9* 8.0* 7.4* 9.5* 8.5* 7.9*  HCT 29.0* 24.4* 22.6* 28.5* 25.0* 23.6*  PLT  --  203 182 205 181 180    Recent Labs  08/03/12 1226 08/04/12 0516 08/05/12 0530 08/06/12 0510  NA 140 136 137 136  K 3.7 4.3 4.2 3.9  CL  --  104 103 101  CO2  --  25 27 26   BUN  --  7 10 11   CREATININE  --  0.78  0.99 0.93  GLUCOSE 133* 125* 124* 99  CALCIUM  --  8.3* 8.4 8.8   Lab Results  Component Value Date   INR 1.07 07/28/2012    Recent Radiographic Studies :   Chest 2 View  07/28/2012   *RADIOLOGY REPORT*  Clinical Data: Preoperative examination (left total hip replacement)  CHEST - 2 VIEW  Comparison: 06/04/2002  Findings:  The examination is degraded secondary to patient body habitus.  Grossly unchanged borderline enlarged cardiac silhouette and mediastinal contours given reduced lung volumes.  Evaluation of the retrosternal clear space is obscured secondary to overlying soft tissues.  No focal airspace  opacities.  No pleural effusion or pneumothorax.  No definite evidence of edema.  Multilevel thoracic spine degenerative change.  Age indeterminate mild (approximately 25%) compression deformity of a lower thoracic vertebral body with associated focal kyphosis at this level. Marked degenerative change and irregularity of the bilateral humeral heads, possibly the sequela of avascular necrosis.  IMPRESSION: 1.  Degraded examination without acute cardiopulmonary disease. 2.  Query avascular necrosis involving the bilateral humeral heads. 3.  Age indeterminate mild (approximately 25%) compression deformity of a lower thoracic vertebral body with associated focal kyphosis at this level.  Correlation for point tenderness at this location is recommended.   Original Report Authenticated By: Tacey Ruiz, MD   Dg Ankle Complete Right  07/25/2012   *RADIOLOGY REPORT*  Clinical Data: Severe right ankle pain.  No recent injuries. Current history of arthritis in the ankle.  RIGHT ANKLE - COMPLETE 3+ VIEW  Comparison: None.  Findings: Complete loss of the joint space with extensive hypertrophic spurring and calcified loose bodies within the joint space.  No evidence of acute fracture or dislocation.  Severe degenerative changes in the visualized midfoot.  Moderately large plantar calcaneal spur.  IMPRESSION: Severe  degenerative changes, query Charcot joint.  Calcified loose bodies within the joint.  No acute osseous abnormality.   Original Report Authenticated By: Hulan Saas, M.D.   Dg Pelvis Portable  08/03/2012   *RADIOLOGY REPORT*  Clinical Data: Postoperative radiographs.  Left total hip arthroplasty.  PORTABLE PELVIS  Comparison: 07/14/2007.  Findings: New left total hip arthroplasties present.  There are no complicating features.  There is a acetabular screw extending through the cup.  Expected postoperative changes in the soft tissues.  Mild to moderate right hip osteoarthritis.  IMPRESSION: Uncomplicated new left total hip arthroplasty.   Original Report Authenticated By: Andreas Newport, M.D.   Dg Hip Portable 1 View Left  08/03/2012   *RADIOLOGY REPORT*  Clinical Data: Postoperative radiographs.  Left total hip arthroplasty.  PORTABLE LEFT HIP - 1 VIEW  Comparison: Frontal view of the hip.  Today.  Findings: New left total hip arthroplasty appears located.  No complicating features.  Technically suboptimal lateral radiograph.  IMPRESSION: Uncomplicated new left total hip arthroplasty.   Original Report Authenticated By: Andreas Newport, M.D.     Examination:  General appearance: alert, cooperative, no distress and morbidly obese  Wound Exam: draining -minmal serous drainage  Drainage:  Scant/small amount Serous exudate  Motor Exam: EHL, FHL, Anterior Tibial and Posterior Tibial Intact  Sensory Exam: Superficial Peroneal, Deep Peroneal and Tibial normal  Vascular Exam: Normal  Assessment:    3 Days Post-Op  Procedure(s) (LRB): TOTAL HIP ARTHROPLASTY (Left)  ADDITIONAL DIAGNOSIS:  Principal Problem:   Osteoarthritis of left hip Active Problems:   Diabetes mellitus type 2 in obese   Hypertension   Asthma, chronic   Sleep apnea  Acute Blood Loss Anemia-stable, asymptomatic   Plan: Physical Therapy as ordered Weight Bearing as Tolerated (WBAT)  DVT Prophylaxis:   Xarelto  DISCHARGE PLAN: Skilled Nursing Facility/Rehab  DISCHARGE NEEDS: HHPT and Walker    Ready for D/C to rehab facility, dressings changed, has blister from initial dressing that is stable    Sandra Brennan 08/06/2012 7:41 AM

## 2012-08-06 NOTE — Clinical Social Work Note (Signed)
Clinical Child psychotherapist facilitated discharge by contacting facility, [facility]. Patient will be transported via ambulance. CSW will complete discharge packet and place with shadow chart. CSW will sign off, as social work intervention is no longer needed.   Rozetta Nunnery MSW, [employer] [phone]

## 2012-08-06 NOTE — Progress Notes (Signed)
Called report to [facility]. Discharged by [facility] ambulance at 1710.Irena Reichmann 08/06/2012

## 2012-08-09 ENCOUNTER — Other Ambulatory Visit: Payer: Self-pay | Admitting: *Deleted

## 2012-08-09 MED ORDER — OXYCODONE HCL 5 MG PO TABS: ORAL_TABLET | ORAL | Status: DC

## 2012-08-12 ENCOUNTER — Non-Acute Institutional Stay (SKILLED_NURSING_FACILITY): Payer: Medicare Other | Admitting: Internal Medicine

## 2012-08-12 DIAGNOSIS — M169 Osteoarthritis of hip, unspecified: Secondary | ICD-10-CM | POA: Diagnosis not present

## 2012-08-12 DIAGNOSIS — E119 Type 2 diabetes mellitus without complications: Secondary | ICD-10-CM | POA: Diagnosis not present

## 2012-08-12 DIAGNOSIS — M1610 Unilateral primary osteoarthritis, unspecified hip: Secondary | ICD-10-CM

## 2012-08-12 DIAGNOSIS — E1169 Type 2 diabetes mellitus with other specified complication: Secondary | ICD-10-CM

## 2012-08-12 DIAGNOSIS — M1612 Unilateral primary osteoarthritis, left hip: Secondary | ICD-10-CM

## 2012-08-12 DIAGNOSIS — D62 Acute posthemorrhagic anemia: Secondary | ICD-10-CM | POA: Diagnosis not present

## 2012-08-12 DIAGNOSIS — J45909 Unspecified asthma, uncomplicated: Secondary | ICD-10-CM | POA: Diagnosis not present

## 2012-08-12 DIAGNOSIS — J4540 Moderate persistent asthma, uncomplicated: Secondary | ICD-10-CM

## 2012-08-12 DIAGNOSIS — E669 Obesity, unspecified: Secondary | ICD-10-CM

## 2012-08-26 ENCOUNTER — Non-Acute Institutional Stay (SKILLED_NURSING_FACILITY): Payer: Medicare Other | Admitting: Adult Health

## 2012-08-26 DIAGNOSIS — M1610 Unilateral primary osteoarthritis, unspecified hip: Secondary | ICD-10-CM

## 2012-08-26 DIAGNOSIS — M169 Osteoarthritis of hip, unspecified: Secondary | ICD-10-CM

## 2012-08-26 DIAGNOSIS — T8140XA Infection following a procedure, unspecified, initial encounter: Secondary | ICD-10-CM

## 2012-08-26 DIAGNOSIS — M109 Gout, unspecified: Secondary | ICD-10-CM | POA: Diagnosis not present

## 2012-08-26 DIAGNOSIS — M1612 Unilateral primary osteoarthritis, left hip: Secondary | ICD-10-CM

## 2012-08-27 ENCOUNTER — Inpatient Hospital Stay (HOSPITAL_COMMUNITY)
Admission: AD | Admit: 2012-08-27 | Discharge: 2012-08-31 | DRG: 863 | Disposition: A | Discharge status: Home Health | Payer: Medicare Other | Source: Ambulatory Visit | Attending: Orthopaedic Surgery | Admitting: Orthopaedic Surgery

## 2012-08-27 DIAGNOSIS — G4730 Sleep apnea, unspecified: Secondary | ICD-10-CM | POA: Diagnosis present

## 2012-08-27 DIAGNOSIS — D62 Acute posthemorrhagic anemia: Secondary | ICD-10-CM | POA: Diagnosis not present

## 2012-08-27 DIAGNOSIS — E119 Type 2 diabetes mellitus without complications: Secondary | ICD-10-CM | POA: Diagnosis present

## 2012-08-27 DIAGNOSIS — L02419 Cutaneous abscess of limb, unspecified: Secondary | ICD-10-CM

## 2012-08-27 DIAGNOSIS — Z96649 Presence of unspecified artificial hip joint: Secondary | ICD-10-CM | POA: Diagnosis not present

## 2012-08-27 DIAGNOSIS — I252 Old myocardial infarction: Secondary | ICD-10-CM

## 2012-08-27 DIAGNOSIS — Z79899 Other long term (current) drug therapy: Secondary | ICD-10-CM | POA: Diagnosis not present

## 2012-08-27 DIAGNOSIS — Z87891 Personal history of nicotine dependence: Secondary | ICD-10-CM

## 2012-08-27 DIAGNOSIS — T8140XA Infection following a procedure, unspecified, initial encounter: Principal | ICD-10-CM | POA: Diagnosis present

## 2012-08-27 DIAGNOSIS — B9689 Other specified bacterial agents as the cause of diseases classified elsewhere: Secondary | ICD-10-CM | POA: Diagnosis not present

## 2012-08-27 DIAGNOSIS — M1990 Unspecified osteoarthritis, unspecified site: Secondary | ICD-10-CM | POA: Diagnosis present

## 2012-08-27 DIAGNOSIS — M545 Low back pain, unspecified: Secondary | ICD-10-CM | POA: Diagnosis present

## 2012-08-27 DIAGNOSIS — G8929 Other chronic pain: Secondary | ICD-10-CM | POA: Diagnosis present

## 2012-08-27 DIAGNOSIS — J45909 Unspecified asthma, uncomplicated: Secondary | ICD-10-CM | POA: Diagnosis present

## 2012-08-27 DIAGNOSIS — K219 Gastro-esophageal reflux disease without esophagitis: Secondary | ICD-10-CM | POA: Diagnosis present

## 2012-08-27 DIAGNOSIS — Y831 Surgical operation with implant of artificial internal device as the cause of abnormal reaction of the patient, or of later complication, without mention of misadventure at the time of the procedure: Secondary | ICD-10-CM | POA: Diagnosis present

## 2012-08-27 DIAGNOSIS — Z6841 Body Mass Index (BMI) 40.0 and over, adult: Secondary | ICD-10-CM | POA: Diagnosis not present

## 2012-08-27 DIAGNOSIS — M810 Age-related osteoporosis without current pathological fracture: Secondary | ICD-10-CM | POA: Diagnosis present

## 2012-08-27 DIAGNOSIS — IMO0002 Reserved for concepts with insufficient information to code with codable children: Secondary | ICD-10-CM | POA: Diagnosis present

## 2012-08-27 DIAGNOSIS — T8149XA Infection following a procedure, other surgical site, initial encounter: Secondary | ICD-10-CM

## 2012-08-27 DIAGNOSIS — I10 Essential (primary) hypertension: Secondary | ICD-10-CM | POA: Diagnosis present

## 2012-08-27 DIAGNOSIS — T8450XA Infection and inflammatory reaction due to unspecified internal joint prosthesis, initial encounter: Secondary | ICD-10-CM | POA: Diagnosis not present

## 2012-08-27 DIAGNOSIS — Z96659 Presence of unspecified artificial knee joint: Secondary | ICD-10-CM

## 2012-08-27 LAB — COMPREHENSIVE METABOLIC PANEL
BUN: 12 mg/dL (ref 6–23)
CO2: 26 mEq/L (ref 19–32)
Chloride: 97 mEq/L (ref 96–112)
Creatinine, Ser: 1.07 mg/dL (ref 0.50–1.10)
GFR calc non Af Amer: 53 mL/min — ABNORMAL LOW (ref >90)
Glucose, Bld: 83 mg/dL (ref 70–99)
Total Bilirubin: 0.2 mg/dL — ABNORMAL LOW (ref 0.3–1.2)

## 2012-08-27 LAB — PROTIME-INR
INR: 1.17 (ref 0.00–1.49)
Prothrombin Time: 14.7 seconds (ref 11.6–15.2)

## 2012-08-27 LAB — CBC WITH DIFFERENTIAL/PLATELET
Basophils Absolute: 0 10*3/uL (ref 0.0–0.1)
Basophils Relative: 0 % (ref 0–1)
Eosinophils Absolute: 0.2 10*3/uL (ref 0.0–0.7)
Eosinophils Relative: 3 % (ref 0–5)
HCT: 28.5 % — ABNORMAL LOW (ref 36.0–46.0)
MCH: 28.3 pg (ref 26.0–34.0)
MCHC: 32.3 g/dL (ref 30.0–36.0)
Monocytes Absolute: 0.6 10*3/uL (ref 0.1–1.0)
Neutro Abs: 4.2 10*3/uL (ref 1.7–7.7)
RDW: 14.1 % (ref 11.5–15.5)

## 2012-08-27 LAB — URINALYSIS, ROUTINE W REFLEX MICROSCOPIC
Bilirubin Urine: NEGATIVE
Hgb urine dipstick: NEGATIVE
Protein, ur: NEGATIVE mg/dL
Urobilinogen, UA: 0.2 mg/dL (ref 0.0–1.0)

## 2012-08-27 LAB — GLUCOSE, CAPILLARY

## 2012-08-27 MED ORDER — CHLORHEXIDINE GLUCONATE 4 % EX LIQD
60.0000 mL | Freq: Once | CUTANEOUS | Status: DC
  Filled 2012-08-27: qty 60

## 2012-08-27 MED ORDER — ACETAMINOPHEN 10 MG/ML IV SOLN
1000.0000 mg | Freq: Once | INTRAVENOUS | Status: AC
  Administered 2012-08-27: 1000 mg via INTRAVENOUS
  Filled 2012-08-27: qty 100

## 2012-08-27 MED ORDER — VANCOMYCIN HCL 10 G IV SOLR
1500.0000 mg | INTRAVENOUS | Status: AC
  Administered 2012-08-28: 1500 mg via INTRAVENOUS
  Filled 2012-08-27: qty 1500

## 2012-08-27 MED ORDER — SODIUM CHLORIDE 0.9 % IV SOLN: INTRAVENOUS | Status: DC

## 2012-08-27 MED ORDER — OXYCODONE HCL 5 MG PO TABS
5.0000 mg | ORAL_TABLET | ORAL | Status: DC | PRN
  Administered 2012-08-27: 5 mg via ORAL
  Filled 2012-08-27: qty 1
  Filled 2012-08-27: qty 2

## 2012-08-27 MED ORDER — CHLORHEXIDINE GLUCONATE 4 % EX LIQD
60.0000 mL | Freq: Every day | CUTANEOUS | Status: DC
  Administered 2012-08-27: 4 via TOPICAL
  Filled 2012-08-27: qty 60

## 2012-08-27 MED ORDER — VANCOMYCIN HCL 500 MG IV SOLR
500.0000 mg | Freq: Once | INTRAVENOUS | Status: DC
  Filled 2012-08-27: qty 500

## 2012-08-27 MED ORDER — DOCUSATE SODIUM 100 MG PO CAPS
100.0000 mg | ORAL_CAPSULE | Freq: Two times a day (BID) | ORAL | Status: DC
  Administered 2012-08-28 – 2012-08-31 (×7): 100 mg via ORAL
  Filled 2012-08-27 (×8): qty 1

## 2012-08-27 MED ORDER — DEXTROSE 50 % IV SOLN
INTRAVENOUS | Status: AC
  Administered 2012-08-27: 50 mL
  Filled 2012-08-27: qty 50

## 2012-08-27 MED ORDER — SODIUM CHLORIDE 0.9 % IV SOLN
INTRAVENOUS | Status: DC
  Administered 2012-08-27: 16:00:00 via INTRAVENOUS

## 2012-08-27 NOTE — Consult Note (Signed)
[facility]     Reason for Consult: infected wound, ? Deep vs superficial    Referring Physician: Dr. Magnus Ivan  Active Problems:   * No active hospital problems. *   . acetaminophen  1,000 mg Intravenous Once  . chlorhexidine  60 mL Topical Q2000  . chlorhexidine  60 mL Topical Once  . [START ON 08/28/2012] vancomycin  1,500 mg Intravenous On Call to OR    Recommendations: Hold antibiotics pending surgical evaluation  [facility] does not have record of any cultures  Assessment: She is morbidly obese, history of OA and recent left hip replacement on 2022/08/27.  Has had some drainage and seen in Dr. Hoy Register office and sent in for evaluation.  Had labs at rehab and apparent culture growing GPR, though no record found.  This may be a contaminate and not the actual pathogen.  Started on doxycycline at rehab yesterday.    Antibiotics: Doxycycline 6/26  HPI: Sandra Brennan is a 67 y.o. female with osteoarthritis, obesity, and history of bilateral knee replacements who recently had been having left hip pain.  She underwent THA of left on 08-27-2022 and was sent for rehab.  There she was noted to have what was described as purulent drainage and labs were significant for increased WBC and an apparent positive culture.  Seen in Dr. Hoy Register office and sent in for possible debridement.  No recent fever or chills, mainly just drainage.     Review of Systems: A comprehensive review of systems was negative.  Past Medical History  Diagnosis Date  . Hypertension   . Asthma   . Shortness of breath     when stressed.  . Myocardial infarction 1992    Aug 26, 2012 "mild MI when son died "  . GERD (gastroesophageal reflux disease)   . Sleep apnea     "never did fix my machine; haven't used one for 5 years; I've lost 116# since then; no problems now" (Aug 26, 2012)  . Type II diabetes mellitus     "borderline; don't test; take Metformin" (26-Aug-2012)  . Anemia   . Migraines    "used to; totally stopped when I quit drinking 30 yr ago" (08-26-2012)  . Arthritis     "plenty" (2012-08-26)  . Degenerative arthritis     "all over" (Aug 26, 2012)  . Osteoporosis     "all over" (26-Aug-2012)  . Chronic lower back pain     "they say I need a whole new spinal column" (08/26/12)    History  Substance Use Topics  . Smoking status: Former Smoker -- 0.12 packs/day for 2 years    Types: Cigarettes  . Smokeless tobacco: Never Used     Comment: August 26, 2012 "quit 30 years ago"  . Alcohol Use: No     Comment: 08/26/2012 "used to be a beeralcoholic; stopped ~ 30 yr ago"    Family History  Problem Relation Age of Onset  . Arthritis Mother   . Arthritis Father   . Diabetes Sister   . Arthritis Sister   . Diabetes Maternal Grandmother   . Arthritis Maternal Grandmother   . Diabetes Maternal Grandfather   . Arthritis Maternal Grandfather    Allergies  Allergen Reactions  . Penicillins Hives  . Sulfa Antibiotics   . Theophyllines Hives    OBJECTIVE: Blood pressure 119/55, pulse 77, temperature 98.4 F (36.9 C), resp. rate 18, SpO2 100.00%. General: Awake, alert, nad Skin: no rashes Lungs: CTA B Cor: RRR  Ext: + minimal  serosanguinous drainage now  Microbiology: No results found for this or any previous visit (from the past 240 hour(s)).  Staci Righter, MD [facility] [email or web address] [phone] pager  [phone] cell 08/27/2012, 2:47 PM

## 2012-08-27 NOTE — Anesthesia Preprocedure Evaluation (Addendum)
Anesthesia Evaluation  Patient identified by MRN, date of birth, ID band Patient awake    Reviewed: Allergy & Precautions, H&P , NPO status , Patient's Chart, lab work & pertinent test results  History of Anesthesia Complications Negative for: history of anesthetic complications  Airway Mallampati: I TM Distance: >3 FB Neck ROM: Full    Dental  (+) Missing, Poor Dentition and Dental Advisory Given   Pulmonary asthma (last inhaler 6 months ago) , sleep apnea (no CPAP needed since 100 lb weight loss) , former smoker,  breath sounds clear to auscultation  Pulmonary exam normal       Cardiovascular hypertension, Pt. on medications + Past MI (remote MI, never was cathed, no stent or CABG, no recurrent chest pain) Rhythm:Regular Rate:Normal  Patient reports normal stress test within the year, no chest pain   Neuro/Psych  Headaches,    GI/Hepatic Neg liver ROS, GERD-  Medicated and Controlled,  Endo/Other  diabetes (glu 64), Well Controlled, Type 2, Oral Hypoglycemic AgentsMorbid obesity  Renal/GU negative Renal ROS     Musculoskeletal   Abdominal (+) + obese,   Peds  Hematology  (+) Blood dyscrasia (Hb 9.2), anemia ,   Anesthesia Other Findings   Reproductive/Obstetrics                           Anesthesia Physical Anesthesia Plan  ASA: III and emergent  Anesthesia Plan: General   Post-op Pain Management:    Induction: Intravenous  Airway Management Planned: Oral ETT  Additional Equipment:   Intra-op Plan:   Post-operative Plan: Extubation in OR  Informed Consent: I have reviewed the patients History and Physical, chart, labs and discussed the procedure including the risks, benefits and alternatives for the proposed anesthesia with the patient or authorized representative who has indicated his/her understanding and acceptance.   Dental advisory given  Plan Discussed with: CRNA and  Surgeon  Anesthesia Plan Comments: (Plan routine monitors, GETA)       Anesthesia Quick Evaluation

## 2012-08-27 NOTE — H&P (Signed)
Sandra Brennan is an 67 y.o. female.   Chief Complaint:   Left hip incision drainage post THA. HPI:   67 yo female who recently underwent a successful left total hip replacement under one month ago by Dr. Cleophas Dunker.  Was doing well post-operatively, but two days ago developed drainage from her hip incision.  She is being admitted to address a likely post-operative infection.  Past Medical History  Diagnosis Date  . Hypertension   . Asthma   . Shortness of breath     when stressed.  . Myocardial infarction 1992    08-27-2012 "mild MI when son died "  . GERD (gastroesophageal reflux disease)   . Sleep apnea     "never did fix my machine; haven't used one for 5 years; I've lost 116# since then; no problems now" (27-Aug-2012)  . Type II diabetes mellitus     "borderline; don't test; take Metformin" (August 27, 2012)  . Anemia   . Migraines     "used to; totally stopped when I quit drinking 30 yr ago" (08/27/2012)  . Arthritis     "plenty" (08/27/12)  . Degenerative arthritis     "all over" (08-27-2012)  . Osteoporosis     "all over" (Aug 27, 2012)  . Chronic lower back pain     "they say I need a whole new spinal column" (Aug 27, 2012)    Past Surgical History  Procedure Laterality Date  . Ganglion cyst excision Right 1980's    "wrist" (08/27/12)  . Knee ligament reconstruction Right 1980's  . Total hip arthroplasty Left Aug 27, 2012  . Tonsillectomy  ~ 1954  . Total knee arthroplasty Left 2001  . Total knee arthroplasty Right 2004  . Joint replacement    . Abdominal hysterectomy  1990's  . Total hip arthroplasty Left 2012/08/27    Procedure: TOTAL HIP ARTHROPLASTY;  Surgeon: [name], MD;  Location: [facility] OR;  Service: Orthopedics;  Laterality: Left;  Left Total Hip Arthroplasty    Family History  Problem Relation Age of Onset  . Arthritis Mother   . Arthritis Father   . Diabetes Sister   . Arthritis Sister   . Diabetes Maternal Grandmother   . Arthritis Maternal Grandmother   . Diabetes  Maternal Grandfather   . Arthritis Maternal Grandfather    Social History:  reports that she has quit smoking. Her smoking use included Cigarettes. She has a .24 pack-year smoking history. She has never used smokeless tobacco. She reports that she does not drink alcohol or use illicit drugs.  Allergies:  Allergies  Allergen Reactions  . Penicillins Hives  . Sulfa Antibiotics     unknown  . Theophyllines Hives    Medications Prior to Admission  Medication Sig Dispense Refill  . acetaminophen (TYLENOL) 500 MG tablet Take 2 tablets (1,000 mg total) by mouth every 6 (six) hours as needed for pain.  30 tablet  0  . albuterol (PROVENTIL HFA;VENTOLIN HFA) 108 (90 BASE) MCG/ACT inhaler Inhale 2 puffs into the lungs every 6 (six) hours as needed for wheezing.      . cycloSPORINE (RESTASIS) 0.05 % ophthalmic emulsion Place 1 drop into both eyes 2 (two) times daily.      Marland Kitchen gabapentin (NEURONTIN) 800 MG tablet Take 800 mg by mouth 2 (two) times daily.      . metFORMIN (GLUMETZA) 500 MG (MOD) 24 hr tablet Take 500 mg by mouth daily with breakfast.      . methocarbamol (ROBAXIN) 500 MG tablet Take 1 tablet (  500 mg total) by mouth every 6 (six) hours as needed (spasms).  40 tablet  0  . omeprazole (PRILOSEC) 40 MG capsule Take 40 mg by mouth daily.      Marland Kitchen oxyCODONE (OXY IR/ROXICODONE) 5 MG immediate release tablet Take 1 tablet every 6 hours. Take 1 to 2 tablets every 4 hours as needed for pain  360 tablet  0  . potassium chloride SA (K-DUR,KLOR-CON) 20 MEQ tablet Take 20 mEq by mouth 2 (two) times daily.      . rivaroxaban (XARELTO) 10 MG TABS tablet Take 1 tablet (10 mg total) by mouth daily.  30 tablet  0  . triamterene-hydrochlorothiazide (MAXZIDE-25) 37.5-25 MG per tablet Take 1 tablet by mouth daily.        Results for orders placed during the hospital encounter of 08/27/12 (from the past 48 hour(s))  COMPREHENSIVE METABOLIC PANEL     Status: Abnormal   Collection Time    08/27/12  3:35 PM       Result Value Range   Sodium 135  135 - 145 mEq/L   Potassium 5.5 (*) 3.5 - 5.1 mEq/L   Comment: HEMOLYSIS AT THIS LEVEL MAY AFFECT RESULT   Chloride 97  96 - 112 mEq/L   CO2 26  19 - 32 mEq/L   Glucose, Bld 83  70 - 99 mg/dL   BUN 12  6 - 23 mg/dL   Creatinine, Ser 1.61  0.50 - 1.10 mg/dL   Calcium 9.3  8.4 - 09.6 mg/dL   Total Protein 7.4  6.0 - 8.3 g/dL   Albumin 2.7 (*) 3.5 - 5.2 g/dL   AST 36  0 - 37 U/L   ALT 12  0 - 35 U/L   Comment: HEMOLYSIS AT THIS LEVEL MAY AFFECT RESULT   Alkaline Phosphatase 83  39 - 117 U/L   Comment: HEMOLYSIS AT THIS LEVEL MAY AFFECT RESULT   Total Bilirubin 0.2 (*) 0.3 - 1.2 mg/dL   GFR calc non Af Amer 53 (*) >90 mL/min   GFR calc Af Amer 62 (*) >90 mL/min   Comment:            The eGFR has been calculated     using the CKD EPI equation.     This calculation has not been     validated in all clinical     situations.     eGFR's persistently     <90 mL/min signify     possible Chronic Kidney Disease.  PROTIME-INR     Status: None   Collection Time    08/27/12  3:35 PM      Result Value Range   Prothrombin Time 14.7  11.6 - 15.2 seconds   INR 1.17  0.00 - 1.49  APTT     Status: Abnormal   Collection Time    08/27/12  3:35 PM      Result Value Range   aPTT 40 (*) 24 - 37 seconds   Comment:            IF BASELINE aPTT IS ELEVATED,     SUGGEST PATIENT RISK ASSESSMENT     BE USED TO DETERMINE APPROPRIATE     ANTICOAGULANT THERAPY.  SEDIMENTATION RATE     Status: Abnormal   Collection Time    08/27/12  3:35 PM      Result Value Range   Sed Rate 90 (*) 0 - 22 mm/hr  CBC WITH DIFFERENTIAL     Status:  Abnormal   Collection Time    08/27/12  3:35 PM      Result Value Range   WBC 7.4  4.0 - 10.5 K/uL   RBC 3.25 (*) 3.87 - 5.11 MIL/uL   Hemoglobin 9.2 (*) 12.0 - 15.0 g/dL   HCT 16.1 (*) 09.6 - 04.5 %   MCV 87.7  78.0 - 100.0 fL   MCH 28.3  26.0 - 34.0 pg   MCHC 32.3  30.0 - 36.0 g/dL   RDW 40.9  81.1 - 91.4 %   Platelets 310  150 -  400 K/uL   Neutrophils Relative % 56  43 - 77 %   Neutro Abs 4.2  1.7 - 7.7 K/uL   Lymphocytes Relative 33  12 - 46 %   Lymphs Abs 2.4  0.7 - 4.0 K/uL   Monocytes Relative 8  3 - 12 %   Monocytes Absolute 0.6  0.1 - 1.0 K/uL   Eosinophils Relative 3  0 - 5 %   Eosinophils Absolute 0.2  0.0 - 0.7 K/uL   Basophils Relative 0  0 - 1 %   Basophils Absolute 0.0  0.0 - 0.1 K/uL  GLUCOSE, CAPILLARY     Status: Abnormal   Collection Time    08/27/12  4:00 PM      Result Value Range   Glucose-Capillary 64 (*) 70 - 99 mg/dL   Comment 1 Notify RN    URINALYSIS, ROUTINE W REFLEX MICROSCOPIC     Status: None   Collection Time    08/27/12  4:28 PM      Result Value Range   Color, Urine YELLOW  YELLOW   APPearance CLEAR  CLEAR   Specific Gravity, Urine 1.010  1.005 - 1.030   pH 7.5  5.0 - 8.0   Glucose, UA NEGATIVE  NEGATIVE mg/dL   Hgb urine dipstick NEGATIVE  NEGATIVE   Bilirubin Urine NEGATIVE  NEGATIVE   Ketones, ur NEGATIVE  NEGATIVE mg/dL   Protein, ur NEGATIVE  NEGATIVE mg/dL   Urobilinogen, UA 0.2  0.0 - 1.0 mg/dL   Nitrite NEGATIVE  NEGATIVE   Leukocytes, UA NEGATIVE  NEGATIVE   Comment: MICROSCOPIC NOT DONE ON URINES WITH NEGATIVE PROTEIN, BLOOD, LEUKOCYTES, NITRITE, OR GLUCOSE <1000 mg/dL.  TYPE AND SCREEN     Status: None   Collection Time    08/27/12  5:30 PM      Result Value Range   ABO/RH(D) A POS     Antibody Screen NEG     Sample Expiration 08/30/2012     No results found.  Review of Systems  All other systems reviewed and are negative.    Blood pressure 119/55, pulse 77, temperature 98.4 F (36.9 C), resp. rate 18, SpO2 100.00%. Physical Exam  Constitutional: She is oriented to person, place, and time. She appears well-developed and well-nourished.  HENT:  Head: Normocephalic and atraumatic.  Eyes: EOM are normal. Pupils are equal, round, and reactive to light.  Neck: Normal range of motion. Neck supple.  Cardiovascular: Normal rate and regular rhythm.    Respiratory: Effort normal and breath sounds normal.  GI: Soft. Bowel sounds are normal.  Musculoskeletal:       Left hip: She exhibits swelling.  Neurological: She is alert and oriented to person, place, and time.  Skin: Skin is warm and dry.  Psychiatric: She has a normal mood and affect.    Her left hip incision is indurated and has drainage that is brown  and smelly worrisome for infection.   Assessment/Plan Left hip post-operative infection. 1) I spoke to Ms. Arts in length and she understands that she will need surgery this evening for an irrigation of her left hip wound including the hip prosthetic joint.  I will likely exchange her poly-liner pending my findings and may possibly place stimulin antibiotic beads. 2) She is admitted as an inpatient and will require an Infectious Disease consult to help determine antibiotic type and duration.  Kathryne Hitch 08/27/2012, 7:02 PM

## 2012-08-27 NOTE — Progress Notes (Signed)
INITIAL NUTRITION ASSESSMENT  DOCUMENTATION CODES Per approved criteria  -Morbid Obesity   INTERVENTION: 1.  Modify diet; resume PO diet once appropriate.  2.  Supplements; Boost BID once diet resumed 3.  Obtain new wt.   NUTRITION DIAGNOSIS: Unintended wt change related to poor appetite as evidenced by pt report, 100 lbs wt loss per pt report.   Monitor:  1.  Food/Beverage; improvement in intake sufficient to meet >/=90% estimated needs.  Reason for Assessment: MST  67 y.o. female  Admitting Dx: left groin pain  ASSESSMENT: Pt recently admitted with left groin pain, now s/p L total hip.    Pt known to clinical nutrition staff due to wt loss reported at recent admission.  Pt previously stated that she was dx with prediabetes "last year" and made some healthy changes which resulted in wt loss.  Pt then started prednisone which greatly diminished her appetite. Pt reports that she weighed 333 lbs at one time- possibly as recent as 5 year ago.   Pt has lost 31% of her usual wt in the past 5 years, 13% in the past 9 months.    Pt is new admission to the unit.  No new ht or wt on file. MST has not been completed.  RD to follow for ongoing nutrition-related interventions.  Height: Ht Readings from Last 1 Encounters:  08/04/12 5' (1.524 m)    Weight: Wt Readings from Last 1 Encounters:  08-28-2012 227 lb 15.3 oz (103.4 kg)  **From previous admission.   Ideal Body Weight: 100 lbs  % Ideal Body Weight: 227%  Wt Readings from Last 10 Encounters:  08-28-12 227 lb 15.3 oz (103.4 kg)  08-28-2012 227 lb 15.3 oz (103.4 kg)  07/28/12 227 lb 15.3 oz (103.4 kg)  11/25/11 264 lb (119.75 kg)    Usual Body Weight: 333 lbs  % Usual Body Weight: 87%  BMI:  There is no weight on file to calculate BMI.  Estimated Nutritional Needs: Kcal: 2060-2270 Protein: 100-120g Fluid: >2.0 L/day  Skin: incision  Diet Order: NPO  EDUCATION NEEDS: -Education needs addressed  No intake or  output data in the 24 hours ending 08/27/12 1449  Last BM: wnl   Labs:  No results found for this basename: NA, K, CL, CO2, BUN, CREATININE, CALCIUM, MG, PHOS, GLUCOSE,  in the last 168 hours  CBG (last 3)  No results found for this basename: GLUCAP,  in the last 72 hours  Scheduled Meds: . acetaminophen  1,000 mg Intravenous Once  . chlorhexidine  60 mL Topical Q2000  . chlorhexidine  60 mL Topical Once  . [START ON 08/28/2012] vancomycin  1,500 mg Intravenous On Call to OR    Continuous Infusions: . sodium chloride    . sodium chloride      Past Medical History  Diagnosis Date  . Hypertension   . Asthma   . Shortness of breath     when stressed.  . Myocardial infarction 1992    08-28-2012 "mild MI when son died "  . GERD (gastroesophageal reflux disease)   . Sleep apnea     "never did fix my machine; haven't used one for 5 years; I've lost 116# since then; no problems now" (08-28-12)  . Type II diabetes mellitus     "borderline; don't test; take Metformin" (28-Aug-2012)  . Anemia   . Migraines     "used to; totally stopped when I quit drinking 30 yr ago" (2012-08-28)  . Arthritis     "  plenty" (08/03/2012)  . Degenerative arthritis     "all over" (08/03/2012)  . Osteoporosis     "all over" (08/03/2012)  . Chronic lower back pain     "they say I need a whole new spinal column" (08/03/2012)    Past Surgical History  Procedure Laterality Date  . Ganglion cyst excision Right 1980's    "wrist" (08/03/2012)  . Knee ligament reconstruction Right 1980's  . Total hip arthroplasty Left 08/03/2012  . Tonsillectomy  ~ 1954  . Total knee arthroplasty Left 2001  . Total knee arthroplasty Right 2004  . Joint replacement    . Abdominal hysterectomy  1990's  . Total hip arthroplasty Left 08/03/2012    Procedure: TOTAL HIP ARTHROPLASTY;  Surgeon: [name], MD;  Location: [facility] OR;  Service: Orthopedics;  Laterality: Left;  Left Total Hip Arthroplasty    Loyce Dys, MS RD  LDN Clinical Inpatient Dietitian Pager: [phone] Weekend/After hours pager: [phone]

## 2012-08-28 ENCOUNTER — Encounter (HOSPITAL_COMMUNITY)
Admission: AD | Disposition: A | Discharge status: Home Health | Payer: Self-pay | Source: Ambulatory Visit | Attending: Orthopaedic Surgery

## 2012-08-28 ENCOUNTER — Encounter (HOSPITAL_COMMUNITY): Payer: Self-pay | Admitting: Orthopedic Surgery

## 2012-08-28 ENCOUNTER — Encounter (HOSPITAL_COMMUNITY): Payer: Self-pay | Admitting: Certified Registered Nurse Anesthetist

## 2012-08-28 ENCOUNTER — Inpatient Hospital Stay (HOSPITAL_COMMUNITY): Payer: Medicare Other | Admitting: Certified Registered Nurse Anesthetist

## 2012-08-28 DIAGNOSIS — T8450XA Infection and inflammatory reaction due to unspecified internal joint prosthesis, initial encounter: Secondary | ICD-10-CM | POA: Diagnosis not present

## 2012-08-28 DIAGNOSIS — B9689 Other specified bacterial agents as the cause of diseases classified elsewhere: Secondary | ICD-10-CM | POA: Diagnosis not present

## 2012-08-28 HISTORY — PX: TOTAL HIP ARTHROPLASTY: SHX124

## 2012-08-28 LAB — BASIC METABOLIC PANEL
BUN: 12 mg/dL (ref 6–23)
Calcium: 8.9 mg/dL (ref 8.4–10.5)
Creatinine, Ser: 1.14 mg/dL — ABNORMAL HIGH (ref 0.50–1.10)
GFR calc Af Amer: 57 mL/min — ABNORMAL LOW (ref >90)
GFR calc non Af Amer: 49 mL/min — ABNORMAL LOW (ref >90)
Glucose, Bld: 91 mg/dL (ref 70–99)
Potassium: 4.6 mEq/L (ref 3.5–5.1)

## 2012-08-28 LAB — GLUCOSE, CAPILLARY
Glucose-Capillary: 93 mg/dL (ref 70–99)
Glucose-Capillary: 84 mg/dL (ref 70–99)

## 2012-08-28 LAB — CBC
MCH: 27.6 pg (ref 26.0–34.0)
MCHC: 31.2 g/dL (ref 30.0–36.0)
Platelets: 259 10*3/uL (ref 150–400)
RDW: 14.1 % (ref 11.5–15.5)

## 2012-08-28 LAB — C-REACTIVE PROTEIN: CRP: 13.3 mg/dL — ABNORMAL HIGH (ref <0.60)

## 2012-08-28 LAB — GRAM STAIN

## 2012-08-28 SURGERY — ARTHROPLASTY, HIP, TOTAL,POSTERIOR APPROACH
Anesthesia: General | Site: Hip | Laterality: Left | Wound class: Dirty or Infected

## 2012-08-28 MED ORDER — SODIUM CHLORIDE 0.9 % IR SOLN
Status: DC | PRN
  Administered 2012-08-28: 6000 mL

## 2012-08-28 MED ORDER — FUROSEMIDE 10 MG/ML IJ SOLN
20.0000 mg | Freq: Once | INTRAMUSCULAR | Status: AC
  Administered 2012-08-28: 20 mg via INTRAVENOUS
  Filled 2012-08-28: qty 2

## 2012-08-28 MED ORDER — PROMETHAZINE HCL 25 MG/ML IJ SOLN: 6.2500 mg | INTRAMUSCULAR | Status: DC | PRN

## 2012-08-28 MED ORDER — ZOLPIDEM TARTRATE 5 MG PO TABS: 5.0000 mg | ORAL_TABLET | Freq: Every evening | ORAL | Status: DC | PRN

## 2012-08-28 MED ORDER — FENTANYL CITRATE 0.05 MG/ML IJ SOLN
INTRAMUSCULAR | Status: DC | PRN
  Administered 2012-08-28 (×3): 50 ug via INTRAVENOUS
  Administered 2012-08-28: 100 ug via INTRAVENOUS

## 2012-08-28 MED ORDER — TRIAMTERENE-HCTZ 37.5-25 MG PO TABS
1.0000 | ORAL_TABLET | Freq: Every day | ORAL | Status: DC
  Administered 2012-08-28 – 2012-08-31 (×4): 1 via ORAL
  Filled 2012-08-28 (×4): qty 1

## 2012-08-28 MED ORDER — LACTATED RINGERS IV SOLN
INTRAVENOUS | Status: DC | PRN
  Administered 2012-08-28 (×2): via INTRAVENOUS

## 2012-08-28 MED ORDER — DIPHENHYDRAMINE HCL 12.5 MG/5ML PO ELIX: 12.5000 mg | ORAL_SOLUTION | ORAL | Status: DC | PRN

## 2012-08-28 MED ORDER — HYDROMORPHONE HCL PF 1 MG/ML IJ SOLN
0.2500 mg | INTRAMUSCULAR | Status: DC | PRN
  Administered 2012-08-28 (×4): 0.5 mg via INTRAVENOUS

## 2012-08-28 MED ORDER — LIDOCAINE HCL (CARDIAC) 20 MG/ML IV SOLN
INTRAVENOUS | Status: DC | PRN
  Administered 2012-08-28: 20 mg via INTRAVENOUS

## 2012-08-28 MED ORDER — METOCLOPRAMIDE HCL 10 MG PO TABS: 5.0000 mg | ORAL_TABLET | Freq: Three times a day (TID) | ORAL | Status: DC | PRN

## 2012-08-28 MED ORDER — METFORMIN HCL ER 500 MG PO TB24
500.0000 mg | ORAL_TABLET | Freq: Every day | ORAL | Status: DC
  Administered 2012-08-28 – 2012-08-31 (×4): 500 mg via ORAL
  Filled 2012-08-28 (×6): qty 1

## 2012-08-28 MED ORDER — ALBUTEROL SULFATE HFA 108 (90 BASE) MCG/ACT IN AERS: 2.0000 | INHALATION_SPRAY | Freq: Four times a day (QID) | RESPIRATORY_TRACT | Status: DC | PRN

## 2012-08-28 MED ORDER — MIDAZOLAM HCL 2 MG/2ML IJ SOLN: 0.5000 mg | Freq: Once | INTRAMUSCULAR | Status: DC | PRN

## 2012-08-28 MED ORDER — RIVAROXABAN 10 MG PO TABS
10.0000 mg | ORAL_TABLET | ORAL | Status: DC
  Administered 2012-08-28 – 2012-08-31 (×4): 10 mg via ORAL
  Filled 2012-08-28 (×6): qty 1

## 2012-08-28 MED ORDER — MORPHINE SULFATE 2 MG/ML IJ SOLN
1.0000 mg | INTRAMUSCULAR | Status: DC | PRN
  Administered 2012-08-28: 1 mg via INTRAVENOUS
  Filled 2012-08-28: qty 1

## 2012-08-28 MED ORDER — PANTOPRAZOLE SODIUM 40 MG PO TBEC
80.0000 mg | DELAYED_RELEASE_TABLET | Freq: Every day | ORAL | Status: DC
  Administered 2012-08-28 – 2012-08-31 (×4): 80 mg via ORAL
  Filled 2012-08-28 (×4): qty 2

## 2012-08-28 MED ORDER — METHOCARBAMOL 500 MG PO TABS
500.0000 mg | ORAL_TABLET | Freq: Four times a day (QID) | ORAL | Status: DC | PRN
  Filled 2012-08-28: qty 1

## 2012-08-28 MED ORDER — METHOCARBAMOL 100 MG/ML IJ SOLN: 500.0000 mg | Freq: Four times a day (QID) | INTRAVENOUS | Status: DC | PRN

## 2012-08-28 MED ORDER — OXYCODONE HCL 5 MG PO TABS: 5.0000 mg | ORAL_TABLET | Freq: Once | ORAL | Status: DC | PRN

## 2012-08-28 MED ORDER — MEPERIDINE HCL 25 MG/ML IJ SOLN: 6.2500 mg | INTRAMUSCULAR | Status: DC | PRN

## 2012-08-28 MED ORDER — HYDROMORPHONE HCL PF 1 MG/ML IJ SOLN: 0.2500 mg | INTRAMUSCULAR | Status: DC | PRN

## 2012-08-28 MED ORDER — VANCOMYCIN HCL IN DEXTROSE 1-5 GM/200ML-% IV SOLN
1000.0000 mg | Freq: Two times a day (BID) | INTRAVENOUS | Status: DC
  Administered 2012-08-28 – 2012-08-30 (×5): 1000 mg via INTRAVENOUS
  Filled 2012-08-28 (×7): qty 200

## 2012-08-28 MED ORDER — METOCLOPRAMIDE HCL 5 MG/ML IJ SOLN: 5.0000 mg | Freq: Three times a day (TID) | INTRAMUSCULAR | Status: DC | PRN

## 2012-08-28 MED ORDER — METHOCARBAMOL 500 MG PO TABS
500.0000 mg | ORAL_TABLET | Freq: Four times a day (QID) | ORAL | Status: DC | PRN
  Administered 2012-08-28 – 2012-08-29 (×4): 500 mg via ORAL
  Filled 2012-08-28 (×3): qty 1

## 2012-08-28 MED ORDER — SODIUM CHLORIDE 0.9 % IV SOLN
INTRAVENOUS | Status: DC
  Administered 2012-08-28 (×2): via INTRAVENOUS
  Administered 2012-08-30: 20 mL/h via INTRAVENOUS
  Administered 2012-08-31: 04:00:00 via INTRAVENOUS

## 2012-08-28 MED ORDER — ONDANSETRON HCL 4 MG/2ML IJ SOLN: 4.0000 mg | Freq: Four times a day (QID) | INTRAMUSCULAR | Status: DC | PRN

## 2012-08-28 MED ORDER — OXYCODONE HCL 5 MG/5ML PO SOLN: 5.0000 mg | Freq: Once | ORAL | Status: DC | PRN

## 2012-08-28 MED ORDER — GABAPENTIN 400 MG PO CAPS
800.0000 mg | ORAL_CAPSULE | Freq: Two times a day (BID) | ORAL | Status: DC
  Administered 2012-08-28 – 2012-08-31 (×7): 800 mg via ORAL
  Filled 2012-08-28 (×8): qty 2

## 2012-08-28 MED ORDER — ONDANSETRON HCL 4 MG PO TABS
4.0000 mg | ORAL_TABLET | Freq: Four times a day (QID) | ORAL | Status: DC | PRN
  Administered 2012-08-30: 4 mg via ORAL
  Filled 2012-08-28: qty 1

## 2012-08-28 MED ORDER — SUCCINYLCHOLINE CHLORIDE 20 MG/ML IJ SOLN
INTRAMUSCULAR | Status: DC | PRN
  Administered 2012-08-28: 110 mg via INTRAVENOUS

## 2012-08-28 MED ORDER — PROPOFOL 10 MG/ML IV BOLUS
INTRAVENOUS | Status: DC | PRN
  Administered 2012-08-28: 120 mg via INTRAVENOUS

## 2012-08-28 MED ORDER — OXYCODONE HCL 5 MG PO TABS
5.0000 mg | ORAL_TABLET | ORAL | Status: DC | PRN
  Administered 2012-08-28 (×4): 10 mg via ORAL
  Administered 2012-08-29: 5 mg via ORAL
  Administered 2012-08-29 – 2012-08-31 (×2): 10 mg via ORAL
  Filled 2012-08-28 (×6): qty 2

## 2012-08-28 MED ORDER — EPHEDRINE SULFATE 50 MG/ML IJ SOLN
INTRAMUSCULAR | Status: DC | PRN
  Administered 2012-08-28: 10 mg via INTRAVENOUS

## 2012-08-28 MED ORDER — CYCLOSPORINE 0.05 % OP EMUL
1.0000 [drp] | Freq: Two times a day (BID) | OPHTHALMIC | Status: DC
  Administered 2012-08-28 – 2012-08-30 (×6): 1 [drp] via OPHTHALMIC
  Administered 2012-08-31: 12:00:00 via OPHTHALMIC
  Filled 2012-08-28 (×8): qty 1

## 2012-08-28 MED ORDER — POTASSIUM CHLORIDE CRYS ER 20 MEQ PO TBCR
20.0000 meq | EXTENDED_RELEASE_TABLET | Freq: Two times a day (BID) | ORAL | Status: DC
  Administered 2012-08-28 – 2012-08-31 (×6): 20 meq via ORAL
  Filled 2012-08-28 (×7): qty 1

## 2012-08-28 MED ORDER — HYDROMORPHONE HCL PF 1 MG/ML IJ SOLN
INTRAMUSCULAR | Status: AC
  Filled 2012-08-28: qty 1

## 2012-08-28 MED ORDER — HYDROCODONE-ACETAMINOPHEN 5-325 MG PO TABS
1.0000 | ORAL_TABLET | ORAL | Status: DC | PRN
  Administered 2012-08-28: 1 via ORAL
  Administered 2012-08-28 – 2012-08-31 (×8): 2 via ORAL
  Filled 2012-08-28 (×4): qty 2
  Filled 2012-08-28: qty 1
  Filled 2012-08-28 (×4): qty 2

## 2012-08-28 SURGICAL SUPPLY — 67 items
BLADE SAW SAG 73X25 THK (BLADE)
BLADE SAW SGTL 73X25 THK (BLADE) IMPLANT
BLADE SURG ROTATE 9660 (MISCELLANEOUS) IMPLANT
BRUSH FEMORAL CANAL (MISCELLANEOUS) IMPLANT
CLOTH BEACON ORANGE TIMEOUT ST (SAFETY) ×2 IMPLANT
CO AXIAL FAN SPRAY TIP SOFT SH (MISCELLANEOUS) ×2 IMPLANT
COVER BACK TABLE 24X17X13 BIG (DRAPES) IMPLANT
COVER SURGICAL LIGHT HANDLE (MISCELLANEOUS) ×2 IMPLANT
DRAPE HIP W/POCKET STRL (DRAPE) ×2 IMPLANT
DRAPE INCISE IOBAN 66X45 STRL (DRAPES) IMPLANT
DRAPE INCISE IOBAN 85X60 (DRAPES) ×4 IMPLANT
DRAPE U-SHAPE 47X51 STRL (DRAPES) ×2 IMPLANT
DRILL BIT 7/64X5 (BIT) IMPLANT
DRSG MEPILEX BORDER 4X12 (GAUZE/BANDAGES/DRESSINGS) IMPLANT
DRSG MEPILEX BORDER 4X8 (GAUZE/BANDAGES/DRESSINGS) ×2 IMPLANT
DRSG PAD ABDOMINAL 8X10 ST (GAUZE/BANDAGES/DRESSINGS) ×2 IMPLANT
DURAPREP 26ML APPLICATOR (WOUND CARE) ×2 IMPLANT
ELECT BLADE 6.5 EXT (BLADE) IMPLANT
ELECT CAUTERY BLADE 6.4 (BLADE) ×2 IMPLANT
ELECT REM PT RETURN 9FT ADLT (ELECTROSURGICAL) ×2
ELECTRODE REM PT RTRN 9FT ADLT (ELECTROSURGICAL) ×1 IMPLANT
EVACUATOR 1/8 PVC DRAIN (DRAIN) ×2 IMPLANT
GAUZE XEROFORM 5X9 LF (GAUZE/BANDAGES/DRESSINGS) ×2 IMPLANT
GLOVE BIO SURGEON STRL SZ7 (GLOVE) ×6 IMPLANT
GLOVE BIO SURGEON STRL SZ7.5 (GLOVE) ×2 IMPLANT
GLOVE BIOGEL PI IND STRL 7.5 (GLOVE) ×2 IMPLANT
GLOVE BIOGEL PI IND STRL 8 (GLOVE) ×1 IMPLANT
GLOVE BIOGEL PI INDICATOR 7.5 (GLOVE) ×2
GLOVE BIOGEL PI INDICATOR 8 (GLOVE) ×1
GLOVE ORTHO TXT STRL SZ7.5 (GLOVE) ×4 IMPLANT
GOWN PREVENTION PLUS LG XLONG (DISPOSABLE) IMPLANT
GOWN PREVENTION PLUS XLARGE (GOWN DISPOSABLE) ×4 IMPLANT
GOWN STRL NON-REIN LRG LVL3 (GOWN DISPOSABLE) ×2 IMPLANT
HANDPIECE INTERPULSE COAX TIP (DISPOSABLE) ×1
KIT BASIN OR (CUSTOM PROCEDURE TRAY) ×2 IMPLANT
KIT ROOM TURNOVER OR (KITS) ×2 IMPLANT
MANIFOLD NEPTUNE II (INSTRUMENTS) ×2 IMPLANT
NS IRRIG 1000ML POUR BTL (IV SOLUTION) ×2 IMPLANT
PACK TOTAL JOINT (CUSTOM PROCEDURE TRAY) ×2 IMPLANT
PAD ARMBOARD 7.5X6 YLW CONV (MISCELLANEOUS) ×4 IMPLANT
PASSER SUT SWANSON 36MM LOOP (INSTRUMENTS) ×2 IMPLANT
PRESSURIZER FEMORAL UNIV (MISCELLANEOUS) IMPLANT
SET HNDPC FAN SPRY TIP SCT (DISPOSABLE) ×1 IMPLANT
SPONGE GAUZE 4X4 12PLY (GAUZE/BANDAGES/DRESSINGS) ×2 IMPLANT
SPONGE LAP 18X18 X RAY DECT (DISPOSABLE) ×2 IMPLANT
SPONGE LAP 4X18 X RAY DECT (DISPOSABLE) IMPLANT
STAPLER VISISTAT 35W (STAPLE) ×2 IMPLANT
SUCTION FRAZIER TIP 10 FR DISP (SUCTIONS) ×2 IMPLANT
SUT ETHIBOND NAB CT1 #1 30IN (SUTURE) IMPLANT
SUT ETHILON 2 0 FS 18 (SUTURE) ×2 IMPLANT
SUT PDS AB 0 CT 36 (SUTURE) ×2 IMPLANT
SUT PDS AB 1 CT  36 (SUTURE) ×2
SUT PDS AB 1 CT 36 (SUTURE) ×2 IMPLANT
SUT VIC AB 0 CT1 27 (SUTURE)
SUT VIC AB 0 CT1 27XBRD ANBCTR (SUTURE) IMPLANT
SUT VIC AB 1 CT1 27 (SUTURE) ×1
SUT VIC AB 1 CT1 27XBRD ANBCTR (SUTURE) ×1 IMPLANT
SUT VIC AB 1 CTB1 27 (SUTURE) IMPLANT
SUT VIC AB 2-0 CT1 27 (SUTURE)
SUT VIC AB 2-0 CT1 TAPERPNT 27 (SUTURE) IMPLANT
SUT VICRYL 4-0 PS2 18IN ABS (SUTURE) IMPLANT
TOWEL OR 17X24 6PK STRL BLUE (TOWEL DISPOSABLE) ×2 IMPLANT
TOWEL OR 17X26 10 PK STRL BLUE (TOWEL DISPOSABLE) ×2 IMPLANT
TOWER CARTRIDGE SMART MIX (DISPOSABLE) IMPLANT
TRAY FOLEY CATH 14FR (SET/KITS/TRAYS/PACK) IMPLANT
WATER STERILE IRR 1000ML POUR (IV SOLUTION) ×6 IMPLANT
YANKAUER SUCT BULB TIP NO VENT (SUCTIONS) ×2 IMPLANT

## 2012-08-28 NOTE — Progress Notes (Signed)
Subjective: Day of Surgery Procedure(s) (LRB): Irrigation and Debridement hip  (Left) Patient reports pain as mild.  Is having fatigue from acute on chronic blood loss anemia.  I found purulent material and necrotic fat tissue superficial to the hip joint and IT band.  This did not seem to track down to the joint.  So far, gram neg rods.  Objective: Vital signs in last 24 hours: Temp:  [97.7 F (36.5 C)-98.5 F (36.9 C)] 97.9 F (36.6 C) (06/28 0547) Pulse Rate:  [75-89] 75 (06/28 0547) Resp:  [10-18] 18 (06/28 0547) BP: (115-145)/(55-97) 115/66 mmHg (06/28 0547) SpO2:  [99 %-100 %] 100 % (06/28 0547) Weight:  [103.4 kg (227 lb 15.3 oz)] 103.4 kg (227 lb 15.3 oz) (06/28 0215)  Intake/Output from previous day: 06/27 0701 - 06/28 0700 In: 1620 [P.O.:120; I.V.:1500] Out: 25 [Drains:25] Intake/Output this shift:     Recent Labs  08/27/12 1535 08/28/12 0410  HGB 9.2* 7.7*    Recent Labs  08/27/12 1535 08/28/12 0410  WBC 7.4 7.7  RBC 3.25* 2.79*  HCT 28.5* 24.7*  PLT 310 259    Recent Labs  08/27/12 1535 08/28/12 0410  NA 135 141  K 5.5* 4.6  CL 97 104  CO2 26 30  BUN 12 12  CREATININE 1.07 1.14*  GLUCOSE 83 91  CALCIUM 9.3 8.9    Recent Labs  08/27/12 1535  INR 1.17    Sensation intact distally Intact pulses distally Dorsiflexion/Plantar flexion intact Incision: dressing C/D/I  Assessment/Plan: Day of Surgery Procedure(s) (LRB): Irrigation and Debridement hip  (Left) Up with therapy Continue IV antibiotics and will await ID consult recs. Will transfuse 2 units PRBCs today.  Bethene Hankinson Y 08/28/2012, 9:06 AM

## 2012-08-28 NOTE — Anesthesia Procedure Notes (Signed)
Procedure Name: Intubation Date/Time: 08/28/2012 12:15 AM Performed by: Molli Hazard Pre-anesthesia Checklist: Patient identified, Emergency Drugs available, Suction available and Patient being monitored Patient Re-evaluated:Patient Re-evaluated prior to inductionOxygen Delivery Method: Circle system utilized Preoxygenation: Pre-oxygenation with 100% oxygen Intubation Type: IV induction Ventilation: Mask ventilation without difficulty Laryngoscope Size: Miller and 2 Grade View: Grade I Tube type: Oral Tube size: 7.5 mm Number of attempts: 1 Airway Equipment and Method: Stylet Placement Confirmation: ETT inserted through vocal cords under direct vision,  positive ETCO2 and breath sounds checked- equal and bilateral Secured at: 21 cm Tube secured with: Tape Dental Injury: Teeth and Oropharynx as per pre-operative assessment

## 2012-08-28 NOTE — Evaluation (Signed)
Physical Therapy Evaluation Patient Details Name: Sandra Brennan MRN: [id number] DOB: May 23, 1945 Today's Date: 08/28/2012 Time: 4098-1191 PT Time Calculation (min): 30 min  PT Assessment / Plan / Recommendation History of Present Illness  67 y.o. female admitted to [facility] from [facility]F (where she was recieving rehab s/p L THA-post prec WBAT) for left hip wound infection.  She underwent I&D of her left hip 08/27/12.   Clinical Impression  The pt is mobilizing very well and reports that she was getting ready to leave SNF to go home.  She would like to go home from hospital. She is min guard assist at most for her mobility around the room with RW.      PT Assessment  Patient needs continued PT services    Follow Up Recommendations  Home health PT;Supervision/Assistance - 24 hour    Does the patient have the potential to tolerate intense rehabilitation     NA  Barriers to Discharge   none      Equipment Recommendations  None recommended by PT    Recommendations for Other Services   none  Frequency Min 5X/week    Precautions / Restrictions Precautions Precautions: Posterior Hip Precaution Booklet Issued: Yes (comment) (handout) Precaution Comments: pt able to report 3/3 hip precautions independently Required Braces or Orthoses:  (none) Restrictions LLE Weight Bearing: Weight bearing as tolerated   Pertinent Vitals/Pain See vitals flow sheet.      Mobility  Bed Mobility Supine to Sit: 5: Supervision;With rails;HOB elevated Sitting - Scoot to Edge of Bed: 5: Supervision;With rail Details for Bed Mobility Assistance: supervision for safety, but pt progressing bil legs to EOB on her own power.  Transfers Sit to Stand: 5: Supervision;With upper extremity assist;With armrests;From bed;From chair/3-in-1;From toilet Stand to Sit: 5: Supervision;With upper extremity assist;With armrests;To chair/3-in-1;To toilet Details for Transfer Assistance: supervision for safety, cues for hand  placement.   Ambulation/Gait Ambulation/Gait Assistance: 4: Min guard Ambulation Distance (Feet): 20 Feet Assistive device: Rolling walker Ambulation/Gait Assistance Details: min guard assist for safety and balance due to first time up.   Gait Pattern: Step-through pattern;Antalgic    Exercises Total Joint Exercises Ankle Circles/Pumps: AROM;Both;20 reps Quad Sets: AROM;Left;10 reps;Supine Short Arc Quad: AROM;Left;10 reps;Supine Heel Slides: AROM;Left;10 reps;Supine Hip ABduction/ADduction: AROM;Both;10 reps;Supine Long Arc Quad: AROM;Both;10 reps;Seated   PT Diagnosis: Difficulty walking;Abnormality of gait;Generalized weakness;Acute pain  PT Problem List: Decreased strength;Decreased range of motion;Decreased activity tolerance;Decreased balance;Decreased mobility;Decreased knowledge of use of DME;Decreased knowledge of precautions;Obesity;Pain PT Treatment Interventions: DME instruction;Gait training;Therapeutic activities;Therapeutic exercise;Stair training;Functional mobility training;Balance training;Patient/family education;Neuromuscular re-education;Modalities     PT Goals(Current goals can be found in the care plan section) Acute Rehab PT Goals Patient Stated Goal: to go home from the hospital and not back to SNF PT Goal Formulation: With patient Time For Goal Achievement: 09/04/12  Visit Information  Last PT Received On: 08/28/12 Assistance Needed: +1 History of Present Illness: 67 y.o. female admitted to [facility] from [facility] SNF (where she was recieving rehab s/p L THA-post prec WBAT) for left hip wound infection.  She underwent I&D of her left hip 08/27/12.        Prior Functioning  Home Living Family/patient expects to be discharged to:: Private residence Living Arrangements: Children;Parent Available Help at Discharge: Family;Available 24 hours/day Type of Home: Apartment Home Access: Level entry Home Layout: One level Home Equipment: Walker - 2 wheels;Bedside  commode;Grab bars - toilet;Grab bars - tub/shower Prior Function Level of Independence: Independent with assistive device(s) Communication Communication: No  difficulties    Cognition  Cognition Arousal/Alertness: Awake/alert Behavior During Therapy: WFL for tasks assessed/performed Overall Cognitive Status: Within Functional Limits for tasks assessed    Extremity/Trunk Assessment Upper Extremity Assessment Upper Extremity Assessment: Overall WFL for tasks assessed Lower Extremity Assessment Lower Extremity Assessment: LLE deficits/detail LLE Deficits / Details: left leg grossly 3/5 per functional assessment.     Balance Static Sitting Balance Static Sitting - Balance Support: Bilateral upper extremity supported;Feet supported Static Sitting - Level of Assistance: 6: Modified independent (Device/Increase time)  End of Session PT - End of Session Activity Tolerance: Patient limited by fatigue;Patient limited by pain Patient left: in chair;with call bell/phone within reach    [place] B. Ricardo Schubach, PT, DPT [phone]   08/28/2012, 4:52 PM

## 2012-08-28 NOTE — Anesthesia Postprocedure Evaluation (Signed)
  Anesthesia Post-op Note  Patient: Sandra Brennan  Procedure(s) Performed: Procedure(s): Irrigation and Debridement hip  (Left)  Patient Location: PACU  Anesthesia Type:General  Level of Consciousness: awake, alert , oriented and patient cooperative  Airway and Oxygen Therapy: Patient Spontanous Breathing and Patient connected to nasal cannula oxygen  Post-op Pain: mild  Post-op Assessment: Post-op Vital signs reviewed, Patient's Cardiovascular Status Stable, Respiratory Function Stable, Patent Airway, No signs of Nausea or vomiting and Pain level controlled  Post-op Vital Signs: Reviewed and stable  Complications: No apparent anesthesia complications

## 2012-08-28 NOTE — Progress Notes (Signed)
ANTIBIOTIC CONSULT NOTE - INITIAL  Pharmacy Consult for vancomycin  Indication: Possible post-op infection  Allergies  Allergen Reactions  . Penicillins Hives  . Sulfa Antibiotics     unknown  . Theophyllines Hives   Patient Measurements: Height: 5' (152.4 cm) Weight: 227 lb 15.3 oz (103.4 kg) (2012-08-29 documentation) IBW/kg (Calculated) : 45.5  Vital Signs: Temp: 97.7 F (36.5 C) (06/28 0215) BP: 119/70 mmHg (06/28 0215) Pulse Rate: 81 (06/28 0215) Intake/Output from previous day: 06/27 0701 - 06/28 0700 In: 1200 [I.V.:1200] Out: -  Intake/Output from this shift: Total I/O In: 1200 [I.V.:1200] Out: -   Labs:  Recent Labs  08/27/12 1535  WBC 7.4  HGB 9.2*  PLT 310  CREATININE 1.07   Estimated Creatinine Clearance: 56.8 ml/min (by C-G formula based on Cr of 1.07). No results found for this basename: VANCOTROUGH, Leodis Binet, VANCORANDOM, GENTTROUGH, GENTPEAK, GENTRANDOM, TOBRATROUGH, TOBRAPEAK, TOBRARND, AMIKACINPEAK, AMIKACINTROU, AMIKACIN,  in the last 72 hours   Microbiology: Recent Results (from the past 720 hour(s))  GRAM STAIN     Status: None   Collection Time    08/28/12 12:46 AM      Result Value Range Status   Specimen Description ABSCESS LEFT HIP   Final   Special Requests NONE   Final   Gram Stain     Final   Value: FEW WBC PRESENT, PREDOMINANTLY PMN     RARE GRAM POSITIVE RODS   Report Status 08/28/2012 FINAL   Final    Medical History: Past Medical History  Diagnosis Date  . Hypertension   . Asthma   . Shortness of breath     when stressed.  . Myocardial infarction 1992    08-29-12 "mild MI when son died "  . GERD (gastroesophageal reflux disease)   . Sleep apnea     "never did fix my machine; haven't used one for 5 years; I've lost 116# since then; no problems now" (29-Aug-2012)  . Type II diabetes mellitus     "borderline; don't test; take Metformin" (08/29/2012)  . Anemia   . Migraines     "used to; totally stopped when I quit drinking  30 yr ago" (08/29/2012)  . Arthritis     "plenty" (2012-08-29)  . Degenerative arthritis     "all over" (Aug 29, 2012)  . Osteoporosis     "all over" (08/29/12)  . Chronic lower back pain     "they say I need a whole new spinal column" (08-29-2012)    Medications:  Scheduled:  . cycloSPORINE  1 drop Both Eyes BID  . docusate sodium  100 mg Oral BID  . gabapentin  800 mg Oral BID  . HYDROmorphone      . metFORMIN  500 mg Oral Q breakfast  . pantoprazole  80 mg Oral Daily  . potassium chloride SA  20 mEq Oral BID  . rivaroxaban  10 mg Oral Q24H  . triamterene-hydrochlorothiazide  1 tablet Oral Daily   Assessment: 67 yo female with h/o THA Aug 30, 2022) with recent drainage from hip incision. Patient now s/p I&D of hip and pharmacy to manage vancomycin for possible post-op infection. Patient received vancomycin 1500mg  IV x 1 at 00:35.   Goal of Therapy:  Vancomycin trough level 15-20 mcg/ml  Plan:  1. Vancomycin 1gm IV Q12H.   Emeline Gins 08/28/2012,2:43 AM

## 2012-08-28 NOTE — Progress Notes (Signed)
    [facility]  Date of Admission:  08/27/2012  Antibiotics: Vancomycin started yesterday post op  Subjective: No complaints  Objective: Temp:  [97.7 F (36.5 C)-98.5 F (36.9 C)] 98.1 F (36.7 C) (06/28 1038) Pulse Rate:  [75-89] 78 (06/28 1038) Resp:  [10-18] 16 (06/28 1038) BP: (94-145)/(49-97) 99/65 mmHg (06/28 1038) SpO2:  [99 %-100 %] 100 % (06/28 0547) Weight:  [227 lb 15.3 oz (103.4 kg)] 227 lb 15.3 oz (103.4 kg) (06/28 0215)  General: Awake, alert, nad Skin: no rashes Lungs: CTA B Cor: RRR without m/r/g Abdomen: soft, nt, nd Ext: post surgical, + drain  Lab Results Lab Results  Component Value Date   WBC 7.7 08/28/2012   HGB 7.7* 08/28/2012   HCT 24.7* 08/28/2012   MCV 88.5 08/28/2012   PLT 259 08/28/2012    Lab Results  Component Value Date   CREATININE 1.14* 08/28/2012   BUN 12 08/28/2012   NA 141 08/28/2012   K 4.6 08/28/2012   CL 104 08/28/2012   CO2 30 08/28/2012    Lab Results  Component Value Date   ALT 12 08/27/2012   AST 36 08/27/2012   ALKPHOS 83 08/27/2012   BILITOT 0.2* 08/27/2012      Microbiology: Recent Results (from the past 240 hour(s))  GRAM STAIN     Status: None   Collection Time    08/28/12 12:46 AM      Result Value Range Status   Specimen Description ABSCESS LEFT HIP   Final   Special Requests NONE   Final   Gram Stain     Final   Value: FEW WBC PRESENT, PREDOMINANTLY PMN     RARE GRAM POSITIVE RODS   Report Status 08/28/2012 FINAL   Final    Studies/Results: No results found.  Assessment/Plan: 1) post operative superficial wound infection - purulence noted, did have a positive gram stain for Gram Positive Rods - this is unusual but also the reported finding in the office culture (I have been unable to locate at [facility]) so will target this.  Vancomycin is appropriate for this.  Not tracking to joint.   -continue vancomycin with trough goal of 12-15 -will await cultures - I will follow cultures,  otherwise Dr. Drue Second with follow up on Monday  Staci Righter, MD [facility] [email or web address] [phone] pager   [phone] cell 08/28/2012, 11:40 AM

## 2012-08-28 NOTE — Op Note (Signed)
NAMEMarland Kitchen  SELENNE, COGGIN NO.:  [id number]  MEDICAL RECORD NO.:  [id number]  LOCATION:  5N23C                        FACILITY:  [facility]  PHYSICIAN:  Vanita Panda. Magnus Ivan, M.D.DATE OF BIRTH:  1945/05/05  DATE OF PROCEDURE:  08/27/2012 DATE OF DISCHARGE:                              OPERATIVE REPORT   PREOPERATIVE DIAGNOSIS:  Left hip postoperative infection, status post total hip arthroplasty.  POSTOPERATIVE DIAGNOSIS:  Left hip postoperative infection, status post total hip arthroplasty.  FINDINGS:  Seroma with the necrotic adipose tissue, left hip with gross purulence, only in the tissues superficial to the iliotibial band, cultures pending.  PROCEDURE:  Irrigation and debridement of left hip, soft tissue, necrotic fat, and surrounding tissue only.  No debridement of muscle.  SURGEON:  Doneen Poisson, MD.  ANESTHESIA:  General.  ANTIBIOTICS:  1.5 mg IV vancomycin following cultures.  BLOOD LOSS:  Less than 100 mL.  COMPLICATIONS:  None.  INDICATIONS:  Mr. Whipkey is a very pleasant 67 year old morbidly obese female, who had a left total hip arthroplasty under a month ago.  She had been doing well postoperatively and at convalescent nursing skilled facility she had been seen postoperative and incision looked pristine. On Wednesday which was 2 days ago, she started developing draining from her wound.  She was seen in the office today and found to have what was felt to be a purulent infection.  Cultures were obtained at the time of her drainage which grew some gram-negative rods, and may have been a contaminant.  However, on my exam she was having purulent drainage from her hip incision.  It was recommended she undergo irrigation and debridement.  The risks and benefits of this were explained to her in detail.  I told her about the possibility of a polyethylene exchange pending.  The findings of this surgery.  PROCEDURE DESCRIPTION:  After informed  consent was obtained, appropriate left hip was marked.  She was brought to the operating room.  General anesthesia was obtained while she was on the stretcher.  She was then placed supine on the operating table and turned into the lateral decubitus position with the left operative hip up.  Hip positioners were placed in the front and the back.  Her left hip was then prepped and draped with DuraPrep and sterile drapes.  Time-out was called and she was identified as correct patient and left hip.  I then made an incision through her previous incision.  Incision was dissected down to the tissue proximal to the iliotibial band, I found necrotic fat and gross purulence.  Cultures were obtained and she was given vancomycin.  I debrided sharply necrotic adipose tissue and some skin but found excellent tissue throughout.  I then used 3 L of normal saline solution using pulsatile lavage and completely lavaged the superficial and deep tissues again proximal to the iliotibial band.  I then changed all instrumentation and probed the iliotibial band and found it to be completely closed.  I then opened up the iliotibial band and dissect down to the hip joint and found no significant fluid collection on the hip joint.  With the hip joint exposed, I made a decision not  to change the polyethylene.  I think it is going to be difficult due to her size and obesity with not having enough hands in the operating room.  I thoroughly irrigated the hip joint with another 3 L of normal saline solution using pulsatile lavage.  I then closed the iliotibial band with interrupted #1 Vicryl and #1 PDS suture, followed by 2-0 Vicryl, and 0 PDS in the deep tissue, 2-0 Vicryl in the subcutaneous tissue, and staples on the skin.  I did place a medium Hemovac drain proximal to the iliotibial band as well.  Xeroform and well-padded sterile dressing was applied and she was rolled back into supine position, awakened, extubated,  and taken to recovery room in stable condition.  All final counts correct.  There were no complications noted.  Postoperatively, Infectious Disease consult will be obtained.  We will make decision of the duration of antibiotics.  Again, I do not feel this infection involved in the deep tissue over the joint at all.     Vanita Panda. Magnus Ivan, M.D.     CYB/MEDQ  D:  08/28/2012  T:  08/28/2012  Job:  [id number]

## 2012-08-28 NOTE — Transfer of Care (Signed)
Immediate Anesthesia Transfer of Care Note  Patient: Sandra Brennan  Procedure(s) Performed: Procedure(s): Irrigation and Debridement hip  (Left)  Patient Location: PACU  Anesthesia Type:General  Level of Consciousness: awake, alert  and oriented  Airway & Oxygen Therapy: Patient Spontanous Breathing and Patient connected to nasal cannula oxygen  Post-op Assessment: Report given to PACU RN, Post -op Vital signs reviewed and stable and Patient moving all extremities X 4  Post vital signs: Reviewed and stable  Complications: No apparent anesthesia complications

## 2012-08-28 NOTE — Brief Op Note (Signed)
08/27/2012 - 08/28/2012  1:39 AM  PATIENT:  Sandra Brennan  67 y.o. female  PRE-OPERATIVE DIAGNOSIS:  hip infection  POST-OPERATIVE DIAGNOSIS:  Left hip infection  PROCEDURE:  Procedure(s): Irrigation and Debridement hip  (Left)  SURGEON:  Surgeon(s) and Role:    * Kathryne Hitch, MD - Primary  PHYSICIAN ASSISTANT:   ASSISTANTS: none   ANESTHESIA:   general  EBL:  Total I/O In: 1000 [I.V.:1000] Out: -   BLOOD ADMINISTERED:none  DRAINS: (medium) Hemovact drain(s) in the left hip superficial to the IT band with  Suction Open   LOCAL MEDICATIONS USED:  NONE  SPECIMEN:  No Specimen  DISPOSITION OF SPECIMEN:  N/A  COUNTS:  YES  TOURNIQUET:  * No tourniquets in log *  DICTATION: .Other Dictation: Dictation Number [id number]  PLAN OF CARE: Admit to inpatient   PATIENT DISPOSITION:  PACU - hemodynamically stable.   Delay start of Pharmacological VTE agent (>24hrs) due to surgical blood loss or risk of bleeding: no

## 2012-08-29 LAB — TYPE AND SCREEN: Unit division: 0

## 2012-08-29 LAB — BASIC METABOLIC PANEL
BUN: 12 mg/dL (ref 6–23)
Calcium: 8.6 mg/dL (ref 8.4–10.5)
Creatinine, Ser: 1.15 mg/dL — ABNORMAL HIGH (ref 0.50–1.10)
GFR calc Af Amer: 57 mL/min — ABNORMAL LOW (ref >90)
GFR calc non Af Amer: 49 mL/min — ABNORMAL LOW (ref >90)
Glucose, Bld: 90 mg/dL (ref 70–99)
Potassium: 3.4 mEq/L — ABNORMAL LOW (ref 3.5–5.1)

## 2012-08-29 NOTE — Evaluation (Signed)
Occupational Therapy Evaluation Patient Details Name: Sandra Brennan MRN: [id number] DOB: 1945-05-19 Today's Date: 08/29/2012 Time: 4696-2952 OT Time Calculation (min): 17 min  OT Assessment / Plan / Recommendation History of present illness  s/p L THA on 08/03/2012   Clinical Impression   Pt admitted from [facility] (where she was receiving rehab) for left hip would infection.  Pt was nearing end of SNF stay and preparing to return home.  Pt is performing mobility/ADLs at overall supervision/min guard level. Recommending pt return home with 24/7 supervision/assist.      OT Assessment  Patient needs continued OT Services    Follow Up Recommendations  No OT follow up;Supervision/Assistance - 24 hour    Barriers to Discharge      Equipment Recommendations  Tub/shower bench    Recommendations for Other Services    Frequency  Min 2X/week    Precautions / Restrictions Precautions Precautions: Posterior Hip Precaution Booklet Issued: Yes (comment) Precaution Comments: pt able to report 3/3 hip precautions independently Restrictions Weight Bearing Restrictions: Yes LLE Weight Bearing: Weight bearing as tolerated   Pertinent Vitals/Pain See vitals    ADL  Grooming: Performed;Wash/dry hands;Supervision/safety Where Assessed - Grooming: Unsupported standing Upper Body Bathing: Simulated;Set up Where Assessed - Upper Body Bathing: Unsupported sitting Lower Body Bathing: Simulated;Minimal assistance Where Assessed - Lower Body Bathing: Unsupported sit to stand Upper Body Dressing: Simulated;Set up Where Assessed - Upper Body Dressing: Unsupported sitting Lower Body Dressing: Simulated;Moderate assistance Where Assessed - Lower Body Dressing: Unsupported sit to stand Toilet Transfer: Performed;Min guard Toilet Transfer Method: Sit to Barista: Comfort height toilet Toileting - Clothing Manipulation and Hygiene: Performed;Supervision/safety Where  Assessed - Engineer, mining and Hygiene: Standing Equipment Used: Rolling walker Transfers/Ambulation Related to ADLs: min guard with RW ADL Comments: Pt reports she is familiar with AE (educated at [employer]) but could use reinforcment. Will need AE for home.   Pt independently maintained hip precautions during toileting tasks.    OT Diagnosis: Generalized weakness;Acute pain  OT Problem List: Decreased strength;Decreased activity tolerance;Decreased knowledge of use of DME or AE;Pain OT Treatment Interventions: Self-care/ADL training;DME and/or AE instruction;Therapeutic activities;Patient/family education   OT Goals(Current goals can be found in the care plan section) Acute Rehab OT Goals Patient Stated Goal: to go home from hospital OT Goal Formulation: With patient Time For Goal Achievement: 09/05/12 Potential to Achieve Goals: Good  Visit Information  Last OT Received On: 08/29/12 Assistance Needed: +1       Prior Functioning     Home Living Family/patient expects to be discharged to:: Private residence Living Arrangements: Children;Parent Available Help at Discharge: Family;Available 24 hours/day Type of Home: Apartment Home Access: Level entry Entrance Stairs-Number of Steps: 1 Home Layout: One level Home Equipment: Walker - 2 wheels;Bedside commode;Grab bars - toilet;Grab bars - tub/shower  Lives With: Daughter Prior Function Level of Independence: Independent with assistive device(s)         Vision/Perception     Cognition  Cognition Arousal/Alertness: Awake/alert Behavior During Therapy: WFL for tasks assessed/performed Overall Cognitive Status: Within Functional Limits for tasks assessed    Extremity/Trunk Assessment Upper Extremity Assessment Upper Extremity Assessment: Overall WFL for tasks assessed     Mobility Bed Mobility Bed Mobility: Supine to Sit;Sitting - Scoot to Edge of Bed;Sit to Supine Supine to Sit: 5:  Supervision Sitting - Scoot to Edge of Bed: 5: Supervision Sit to Supine: 5: Supervision Transfers Transfers: Sit to Stand;Stand to Sit Sit to Stand:  5: Supervision;From bed;From toilet Stand to Sit: 5: Supervision;To bed;To toilet Details for Transfer Assistance: supervision for safety     Exercise     Balance     End of Session OT - End of Session Equipment Utilized During Treatment: Rolling walker Activity Tolerance: Patient tolerated treatment well Patient left: in bed;with call bell/phone within reach  GO   08/29/2012 Cipriano Mile OTR/L Pager [phone] Office [phone]   Cipriano Mile 08/29/2012, 2:33 PM

## 2012-08-29 NOTE — Progress Notes (Addendum)
Subjective: 1 Day Post-Op Procedure(s) (LRB): Irrigation and Debridement hip  (Left) Patient reports pain as mild.  Did well with therapy yesterday.  Feels better overall.  Also transfused yesterday.  ID consult is following.  Gram neg rods thus.  On IV Vanc.  Objective: Vital signs in last 24 hours: Temp:  [98.1 F (36.7 C)-98.5 F (36.9 C)] 98.4 F (36.9 C) (06/29 0639) Pulse Rate:  [68-86] 86 (06/29 0639) Resp:  [16-18] 16 (06/29 0639) BP: (93-121)/(49-71) 107/71 mmHg (06/29 0639) SpO2:  [100 %] 100 % (06/29 0639)  Intake/Output from previous day: 06/28 0701 - 06/29 0700 In: 1755 [P.O.:480; I.V.:500; Blood:775] Out: 35 [Drains:35] Intake/Output this shift:     Recent Labs  08/27/12 1535 08/28/12 0410  HGB 9.2* 7.7*    Recent Labs  08/27/12 1535 08/28/12 0410  WBC 7.4 7.7  RBC 3.25* 2.79*  HCT 28.5* 24.7*  PLT 310 259    Recent Labs  08/28/12 0410 08/29/12 0545  NA 141 137  K 4.6 3.4*  CL 104 100  CO2 30 28  BUN 12 12  CREATININE 1.14* 1.15*  GLUCOSE 91 90  CALCIUM 8.9 8.6    Recent Labs  08/27/12 1535  INR 1.17    Sensation intact distally Intact pulses distally Dorsiflexion/Plantar flexion intact Incision: scant drainage Much less cellulitis and no gross purulence. I removed the hemovac.   Assessment/Plan: 1 Day Post-Op Procedure(s) (LRB): Irrigation and Debridement hip  (Left) Up with therapy Continue ABX therapy due to Post-op infection  Katelee Schupp Y 08/29/2012, 9:27 AM   Correction:  Gram positive rods found

## 2012-08-29 NOTE — Progress Notes (Signed)
Physical Therapy Treatment Patient Details Name: Sandra Brennan MRN: [id number] DOB: 1945-11-02 Today's Date: 08/29/2012 Time: 4098-1191 PT Time Calculation (min): 24 min  PT Assessment / Plan / Recommendation  PT Comments   Pt making great progress toward goals with incr'd activity today and less assistance/cues needed.  Follow Up Recommendations  Home health PT;Supervision/Assistance - 24 hour           Equipment Recommendations  None recommended by PT       Frequency Min 5X/week   Progress towards PT Goals Progress towards PT goals: Progressing toward goals  Plan Current plan remains appropriate    Precautions / Restrictions Precautions Precautions: Posterior Hip Precaution Booklet Issued: Yes (comment) Precaution Comments: pt able to report 3/3 hip precautions independently Restrictions Weight Bearing Restrictions: Yes LLE Weight Bearing: Weight bearing as tolerated       Mobility  Bed Mobility Bed Mobility: Sit to Supine Supine to Sit: 5: Supervision;HOB flat Sitting - Scoot to Edge of Bed: 5: Supervision Sit to Supine: 5: Supervision;HOB flat Scooting to HOB: 5: Supervision Details for Bed Mobility Assistance: supervision for safety, but pt progressing bil legs to EOB on her own power.  Transfers Sit to Stand: 6: Modified independent (Device/Increase time);From bed;With upper extremity assist Stand to Sit: 6: Modified independent (Device/Increase time);To bed;Without upper extremity assist Details for Transfer Assistance: no cues or assistance needed. Ambulation/Gait Ambulation/Gait Assistance: 5: Supervision Ambulation Distance (Feet): 200 Feet Assistive device: Rolling walker Ambulation/Gait Assistance Details: occasional cues for posture and walker position with gait Gait Pattern: Step-through pattern;Antalgic;Decreased stride length    Exercises Total Joint Exercises Ankle Circles/Pumps: AROM;Both;10 reps;Supine Quad Sets: AROM;Both;10  reps;Supine Short Arc Quad: AROM;Strengthening;Left;10 reps;Supine Heel Slides: AROM;Strengthening;Left;10 reps;Supine Hip ABduction/ADduction: AROM;Strengthening;Left;10 reps;Supine      PT Goals (current goals can now be found in the care plan section) Acute Rehab PT Goals Patient Stated Goal: to go home from hospital PT Goal Formulation: With patient Time For Goal Achievement: 09/04/12 Potential to Achieve Goals: Good  Visit Information  Last PT Received On: 08/29/12 Assistance Needed: +1 History of Present Illness: 67 y.o. female admitted to [facility] from [facility] (where she was recieving rehab s/p L THA-post prec WBAT) for left hip wound infection.  She underwent I&D of her left hip 08/27/12.     Subjective Data  Patient Stated Goal: to go home from hospital   Cognition  Cognition Arousal/Alertness: Awake/alert Behavior During Therapy: WFL for tasks assessed/performed Overall Cognitive Status: Within Functional Limits for tasks assessed       End of Session PT - End of Session Equipment Utilized During Treatment: Gait belt Activity Tolerance: Patient tolerated treatment well Patient left: in bed;with call bell/phone within reach Nurse Communication: Mobility status   GP     Sallyanne Kuster 08/29/2012, 4:36 PM  Sallyanne Kuster, PTA Office- [phone]

## 2012-08-30 LAB — BASIC METABOLIC PANEL
BUN: 11 mg/dL (ref 6–23)
Creatinine, Ser: 0.99 mg/dL (ref 0.50–1.10)
GFR calc non Af Amer: 59 mL/min — ABNORMAL LOW (ref >90)
Glucose, Bld: 82 mg/dL (ref 70–99)
Potassium: 4.1 mEq/L (ref 3.5–5.1)

## 2012-08-30 LAB — URINE CULTURE: Colony Count: 15000

## 2012-08-30 LAB — CBC
HCT: 29.7 % — ABNORMAL LOW (ref 36.0–46.0)
Hemoglobin: 9.6 g/dL — ABNORMAL LOW (ref 12.0–15.0)
MCH: 28.4 pg (ref 26.0–34.0)
MCHC: 32.3 g/dL (ref 30.0–36.0)
RDW: 14.4 % (ref 11.5–15.5)

## 2012-08-30 LAB — VANCOMYCIN, TROUGH: Vancomycin Tr: 25.1 ug/mL (ref 10.0–20.0)

## 2012-08-30 MED ORDER — SODIUM CHLORIDE 0.9 % IJ SOLN
10.0000 mL | INTRAMUSCULAR | Status: DC | PRN
  Administered 2012-08-30 (×2): 10 mL

## 2012-08-30 MED ORDER — VANCOMYCIN HCL 10 G IV SOLR
1750.0000 mg | INTRAVENOUS | Status: DC
  Administered 2012-08-31: 1750 mg via INTRAVENOUS
  Filled 2012-08-30 (×2): qty 1750

## 2012-08-30 NOTE — Care Management Note (Unsigned)
    Page 1 of 1   08/30/2012     5:00:54 PM   CARE MANAGEMENT NOTE 08/30/2012  Patient:  Sandra Brennan, Sandra Brennan   Account Number:  [id number]  Date Initiated:  08/30/2012  Documentation initiated by:  [facility]  Subjective/Objective Assessment:   admitted with left hip infection     Action/Plan:   plan discharge to home with [facility] IV antibiotics, HHPT   Anticipated DC Date:  08/31/2012   Anticipated DC Plan:  HOME W HOME HEALTH SERVICES         Choice offered to / List presented to:  C-1 Patient        HH arranged  HH-1 RN  HH-2 PT      [facility] agency  [facility]   Status of service:  In process, will continue to follow Medicare Important Message given?   (If response is "NO", the following Medicare IM given date fields will be blank) Date Medicare IM given:   Date Additional Medicare IM given:    Discharge Disposition:    Per UR Regulation:    If discussed at Long Length of Stay Meetings, dates discussed:    Comments:  08/30/12 Spoke with patient about HHC. She chose [facility] for [facility] for Iv antibiotics, HHPT. Contacted Candie Gintz with [facility] and set up HHRN, IV antibiotics, HHPT. Patient states that she has 2 daughters who will be able  to assist wit IV antibiotics and a sister who will be staying with her who will also be able to assist. Will contibue to follow for d/c needs. Jacquelynn Cree RN, BSN, CCM

## 2012-08-30 NOTE — Progress Notes (Addendum)
[facility]    Date of Admission:  08/27/2012   Total days of antibiotics 4        Day 4 vanco           ID: Sandra Brennan is a 67 y.o. female with   osteoarthritis, obesity, s/p THA of left hip on 6/3 and was sent for rehab. c/b purulent drainage at surgical incision site and labs were significant for increased WBC and an apparent positive culture.  No recent fever or chills, mainly just drainage.  Principal Problem:   Postoperative wound infection of left hip    Subjective: afebrile  Medications:  . cycloSPORINE  1 drop Both Eyes BID  . docusate sodium  100 mg Oral BID  . gabapentin  800 mg Oral BID  . metFORMIN  500 mg Oral Q breakfast  . pantoprazole  80 mg Oral Daily  . potassium chloride SA  20 mEq Oral BID  . rivaroxaban  10 mg Oral Q24H  . triamterene-hydrochlorothiazide  1 tablet Oral Daily  . vancomycin  1,000 mg Intravenous Q12H    Objective: Vital signs in last 24 hours: Temp:  [98.5 F (36.9 C)-98.8 F (37.1 C)] 98.7 F (37.1 C) (06/30 1610) Pulse Rate:  [75-78] 78 (06/30 0613) Resp:  [16-18] 18 (06/30 0613) BP: (109-127)/(60-66) 127/60 mmHg (06/30 0613) SpO2:  [99 %-100 %] 100 % (06/30 9604) General: Awake, alert, nad  Skin: no rashes  Lungs: CTA B  Cor: RRR  Ext: left hip is wrapped no erythema  Lab Results  Recent Labs  08/28/12 0410 08/29/12 0545 08/30/12 0520  WBC 7.7  --  6.5  HGB 7.7*  --  9.6*  HCT 24.7*  --  29.7*  NA 141 137 137  K 4.6 3.4* 4.1  CL 104 100 102  CO2 30 28 28   BUN 12 12 11   CREATININE 1.14* 1.15* 0.99   Liver Panel  Recent Labs  08/27/12 1535  PROT 7.4  ALBUMIN 2.7*  AST 36  ALT 12  ALKPHOS 83  BILITOT 0.2*   Sedimentation Rate  Recent Labs  08/27/12 1535  ESRSEDRATE 90*   C-Reactive Protein  Recent Labs  08/27/12 1535  CRP 13.3*   ua = negative  Microbiology: 6/28 wound cx: corynebacterium  6/27 urine cx: 15,000 CFU, enterococcus (amp  S)  Studies/Results: No results found.   Assessment/Plan: 67yo F with morbidly obesity,OA s/p left hip replacement on 6/3 c/b incisional drainage. Office wound cx growing GPR, started on doxycycline but admitted on 6/27 for I x D POD#2, OR report suggests fat necrosis and purulent exudate in deep tissue "sparing iliotibial band and found it to be completely closed. I then opened up the iliotibial band and dissect down to the hip joint and found no significant fluid collection on the hip joint.". Cultures are growing GPR, corynebacterium. Often seen as part of skin flora, thought to be contaminant, however in this case it has been isolated on superficial wound swab as well as deep cultures from the OR, c/w pathogen causing her infection  - recommend to treat with vancomycin for 4 wks via picc line - will need cbc and bmp and vanco trough (goal of 15-20) checked on a weekly basis - will see her back in ID clinic in 3-4 wks for follow up - i have asked lab to determine corynebacterium species  - colonized with enterococcus in urine, only 15,000 colonies. Will not treat since she is  asymptomatic bacturia.  Will sign off, call if questions  Drue Second [facility] Cell: [phone] Pager: [phone]  08/30/2012, 10:54 AM

## 2012-08-30 NOTE — Progress Notes (Signed)
Patient ID: Sandra Brennan, female   DOB: September 29, 1945, 67 y.o.   MRN: [id number] Her left hip wound continues to look good.  The cellulitis is resolving and the drainage is scant.  I placed a new dry dressing.  Awaiting PICC line for 4 weeks of Vanc.  Could likely discharge to home tomorrow.

## 2012-08-30 NOTE — Progress Notes (Signed)
Peripherally Inserted Central Catheter/Midline Placement  The IV Nurse has discussed with the patient and/or persons authorized to consent for the patient, the purpose of this procedure and the potential benefits and risks involved with this procedure.  The benefits include less needle sticks, lab draws from the catheter and patient may be discharged home with the catheter.  Risks include, but not limited to, infection, bleeding, blood clot (thrombus formation), and puncture of an artery; nerve damage and irregular heat beat.  Alternatives to this procedure were also discussed.  PICC/Midline Placement Documentation        Lisabeth Devoid 08/30/2012, 2:35 PM Consent obtained by Merleen Milliner, RN, CRNI

## 2012-08-30 NOTE — Progress Notes (Signed)
Patient ID: Sandra Brennan, female   DOB: 31-Dec-1945, 67 y.o.   MRN: [id number] PATIENT ID: Sandra Brennan        MRN:  [id number]          DOB/AGE: Apr 08, 1945 / 67 y.o.    Sandra Campbell, MD   Sandra Code, PA-C [address] [place], [place]  [address]                             [phone]   PROGRESS NOTE  Subjective:  negative for Chest Pain  negative for Shortness of Breath  negative for Nausea/Vomiting   negative for Calf Pain    Tolerating Diet: yes         Patient reports pain as mild.     Very comfortable.  Objective: Vital signs in last 24 hours:   Patient Vitals for the past 24 hrs:  BP Temp Temp src Pulse Resp SpO2  08/30/12 0613 127/60 mmHg 98.7 F (37.1 C) Oral 78 18 100 %  08/29/12 2204 109/66 mmHg 98.8 F (37.1 C) Oral 75 18 99 %  08/29/12 1443 111/63 mmHg 98.5 F (36.9 C) - 78 16 100 %      Intake/Output from previous day:   06/29 0701 - 06/30 0700 In: 360 [P.O.:360] Out: -    Intake/Output this shift:       Intake/Output     06/29 0701 - 06/30 0700 06/30 0701 - 07/01 0700   P.O. 360    I.V. (mL/kg)     Blood     Total Intake(mL/kg) 360 (3.5)    Drains     Total Output       Net +360          Urine Occurrence 4 x    Stool Occurrence 2 x       LABORATORY DATA:  Recent Labs  08/27/12 1535 08/28/12 0410 08/30/12 0520  WBC 7.4 7.7 6.5  HGB 9.2* 7.7* 9.6*  HCT 28.5* 24.7* 29.7*  PLT 310 259 249    Recent Labs  08/27/12 1535 08/28/12 0410 08/29/12 0545 08/30/12 0520  NA 135 141 137 137  K 5.5* 4.6 3.4* 4.1  CL 97 104 100 102  CO2 26 30 28 28   BUN 12 12 12 11   CREATININE 1.07 1.14* 1.15* 0.99  GLUCOSE 83 91 90 82  CALCIUM 9.3 8.9 8.6 9.0   Lab Results  Component Value Date   INR 1.17 08/27/2012   INR 1.07 07/28/2012    Recent Radiographic Studies :  Dg Pelvis Portable  08/03/2012   *RADIOLOGY REPORT*  Clinical Data: Postoperative radiographs.  Left total hip arthroplasty.  PORTABLE PELVIS  Comparison: 07/14/2007.   Findings: New left total hip arthroplasties present.  There are no complicating features.  There is a acetabular screw extending through the cup.  Expected postoperative changes in the soft tissues.  Mild to moderate right hip osteoarthritis.  IMPRESSION: Uncomplicated new left total hip arthroplasty.   Original Report Authenticated By: Andreas Newport, M.D.   Dg Hip Portable 1 View Left  08/03/2012   *RADIOLOGY REPORT*  Clinical Data: Postoperative radiographs.  Left total hip arthroplasty.  PORTABLE LEFT HIP - 1 VIEW  Comparison: Frontal view of the hip.  Today.  Findings: New left total hip arthroplasty appears located.  No complicating features.  Technically suboptimal lateral radiograph.  IMPRESSION: Uncomplicated new left total hip arthroplasty.   Original Report Authenticated By:  Andreas Newport, M.D.     Examination:  General appearance: alert, cooperative and no distress  Wound Exam: clean, dry, intact dressing  motor Exam: EHL, FHL, Anterior Tibial and Posterior Tibial Intact  Sensory Exam: Superficial Peroneal, Deep Peroneal and Tibial normal   Assessment:    2 Days Post-Op  Procedure(s) (LRB): Irrigation and Debridement hip  (Left)  ADDITIONAL DIAGNOSIS:  Principal Problem:   Postoperative wound infection of left hip Corynebacterium species - few     Plan: Physical Therapy as ordered Weight Bearing as Tolerated (WBAT)  DVT Prophylaxis:  Xarelto and TED hose  DISCHARGE PLAN: Home patient does not want to return to [facility]  DISCHARGE NEEDS: HHPT, HHRN, 3-in-1 comode seat and IV Antibiotics  Awaiting PICC line        Sandra Brennan 08/30/2012 11:44 AM 2

## 2012-08-30 NOTE — Progress Notes (Signed)
ANTIBIOTIC CONSULT NOTE - Follow Up  Pharmacy Consult for vancomycin  Indication: Post op wound infection of left hip-s/p THA  Allergies  Allergen Reactions  . Penicillins Hives  . Sulfa Antibiotics     unknown  . Theophyllines Hives   Patient Measurements: Height: 5' (152.4 cm) Weight: 227 lb 15.3 oz (103.4 kg) (08/03/12 documentation) IBW/kg (Calculated) : 45.5  Vital Signs: Temp: 98.3 F (36.8 C) (06/30 2056) Temp src: Oral (06/30 2056) BP: 124/70 mmHg (06/30 2056) Pulse Rate: 79 (06/30 2056) Intake/Output from previous day: 06/29 0701 - 06/30 0700 In: 360 [P.O.:360] Out: -  Intake/Output from this shift:    Labs:  Recent Labs  08/28/12 0410 08/29/12 0545 08/30/12 0520  WBC 7.7  --  6.5  HGB 7.7*  --  9.6*  PLT 259  --  249  CREATININE 1.14* 1.15* 0.99   Estimated Creatinine Clearance: 61.4 ml/min (by C-G formula based on Cr of 0.99).  Recent Labs  08/30/12 1930  VANCOTROUGH 25.1*     Microbiology: Recent Results (from the past 720 hour(s))  URINE CULTURE     Status: None   Collection Time    08/27/12  4:28 PM      Result Value Range Status   Specimen Description URINE, CLEAN CATCH   Final   Special Requests NONE   Final   Culture  Setup Time 08/27/2012 17:25   Final   Colony Count 15,000 COLONIES/ML   Final   Culture ENTEROCOCCUS SPECIES   Final   Report Status 08/30/2012 FINAL   Final   Organism ID, Bacteria ENTEROCOCCUS SPECIES   Final  GRAM STAIN     Status: None   Collection Time    08/28/12 12:46 AM      Result Value Range Status   Specimen Description ABSCESS LEFT HIP   Final   Special Requests NONE   Final   Gram Stain     Final   Value: FEW WBC PRESENT, PREDOMINANTLY PMN     RARE GRAM POSITIVE RODS   Report Status 08/28/2012 FINAL   Final  CULTURE, ROUTINE-ABSCESS     Status: None   Collection Time    08/28/12 12:46 AM      Result Value Range Status   Specimen Description ABSCESS LEFT HIP   Final   Special Requests NONE   Final    Gram Stain     Final   Value: FEW WBC PRESENT,BOTH PMN AND MONONUCLEAR     NO SQUAMOUS EPITHELIAL CELLS SEEN     RARE GRAM POSITIVE RODS   Culture     Final   Value: FEW DIPHTHEROIDS(CORYNEBACTERIUM SPECIES)     Note: Standardized susceptibility testing for this organism is not available.   Report Status 08/30/2012 FINAL   Final  ANAEROBIC CULTURE     Status: None   Collection Time    08/28/12 12:47 AM      Result Value Range Status   Specimen Description ABSCESS LEFT HIP   Final   Special Requests NONE   Final   Gram Stain PENDING   Incomplete   Culture     Final   Value: NO ANAEROBES ISOLATED; CULTURE IN PROGRESS FOR 5 DAYS   Report Status PENDING   Incomplete    Medical History: Past Medical History  Diagnosis Date  . Hypertension   . Asthma   . Shortness of breath     when stressed.  . Myocardial infarction 1992    08/03/2012 "mild MI  when son died "  . GERD (gastroesophageal reflux disease)   . Sleep apnea     "never did fix my machine; haven't used one for 5 years; I've lost 116# since then; no problems now" (08/03/2012)  . Type II diabetes mellitus     "borderline; don't test; take Metformin" (08/03/2012)  . Anemia   . Migraines     "used to; totally stopped when I quit drinking 30 yr ago" (08/03/2012)  . Arthritis     "plenty" (08/03/2012)  . Degenerative arthritis     "all over" (08/03/2012)  . Osteoporosis     "all over" (08/03/2012)  . Chronic lower back pain     "they say I need a whole new spinal column" (08/03/2012)    Medications:  Scheduled:  . cycloSPORINE  1 drop Both Eyes BID  . docusate sodium  100 mg Oral BID  . gabapentin  800 mg Oral BID  . metFORMIN  500 mg Oral Q breakfast  . pantoprazole  80 mg Oral Daily  . potassium chloride SA  20 mEq Oral BID  . rivaroxaban  10 mg Oral Q24H  . triamterene-hydrochlorothiazide  1 tablet Oral Daily  . vancomycin  1,000 mg Intravenous Q12H   Assessment: 67 yo female s/p I&D of hip on day 3 of vancomycin therapy  for post op wound infection. Wound culture growing Corynebacterium and ID recommends treating with vancomycin for 4 weeks via PICC line.  Vanc trough level=25.1, above goal.    Goal of Therapy:  Vancomycin trough level 15-20 mcg/ml  Plan:  Change Vancomycin to 1750mg  IV q24hrs. Recommend rechecking trough level before 4th dose.  Wendie Simmer, PharmD, BCPS Clinical Pharmacist  Pager: [phone]

## 2012-08-30 NOTE — Progress Notes (Signed)
Occupational Therapy Treatment Patient Details Name: Sandra Brennan MRN: [id number] DOB: 05-12-1945 Today's Date: 08/30/2012 Time:  -     OT Assessment / Plan / Recommendation  OT comments  Pt reports feeling nauseous upon entering room , but needed to use bathroom. Pt requesting to return to bed after toileting. Pt's nurse aware of nausea. Tx session also limited due to IV team coming in. Most likely pt will d/c home/SNF tomorrow   Follow Up Recommendations  No OT follow up;Supervision/Assistance - 24 hour    Barriers to Discharge   None    Equipment Recommendations  Tub/shower bench    Recommendations for Other Services    Frequency Min 2X/week   Progress towards OT Goals Progress towards OT goals: Progressing toward goals  Plan Discharge plan remains appropriate    Precautions / Restrictions Precautions Precautions: Posterior Hip Precaution Comments: pt able to indpendently recall 3/3 hip preacutions Restrictions LLE Weight Bearing: Weight bearing as tolerated   Pertinent Vitals/Pain     ADL  Toilet Transfer: Performed;Min guard Toilet Transfer Method: Sit to Barista: Comfort height toilet Toileting - Clothing Manipulation and Hygiene: Performed;Supervision/safety Where Assessed - Engineer, mining and Hygiene: Standing Equipment Used: Rolling walker ADL Comments: Pt feeling nauseous per nrsg upon entering room, tx session limited due to IV team coming in  for PICC    OT Diagnosis:    OT Problem List:   OT Treatment Interventions:     OT Goals(current goals can now be found in the care plan section)    Visit Information  Last OT Received On: 08/30/12 Assistance Needed: +1 History of Present Illness: 67 y.o. female admitted to [facility] from [facility] (where she was recieving rehab s/p L THA-post prec WBAT) for left hip wound infection.  She underwent I&D of her left hip 08/27/12.     Subjective Data      Prior Functioning     Cognition  Cognition Arousal/Alertness: Awake/alert Behavior During Therapy: WFL for tasks assessed/performed Overall Cognitive Status: Within Functional Limits for tasks assessed    Mobility  Bed Mobility Bed Mobility: Sit to Supine;Scooting to HOB Supine to Sit: 5: Supervision;HOB flat Scooting to HOB: 5: Supervision Details for Bed Mobility Assistance: supervision for safety, but pt progressing bil legs to EOB on her own power.  Transfers Transfers: Sit to Stand;Stand to Sit Sit to Stand: 5: Supervision;From chair/3-in-1 Stand to Sit: To chair/3-in-1;To bed Details for Transfer Assistance: pt required increased time, reports of nausea per nrsg and pt    Exercises      Balance Balance Balance Assessed: No   End of Session OT - End of Session Equipment Utilized During Treatment: Rolling walker;Gait belt Activity Tolerance: Other (comment) (pt limited by nausea) Patient left: in bed;with call bell/phone within reach  GO     Galen Manila 08/30/2012, 2:17 PM

## 2012-08-30 NOTE — Progress Notes (Signed)
Physical Therapy Treatment Patient Details Name: Sandra Brennan MRN: [id number] DOB: 1945/09/12 Today's Date: 08/30/2012 Time: 4098-1191 PT Time Calculation (min): 13 min  PT Assessment / Plan / Recommendation  PT Comments   Pt is safe from PT standpoint to D/C home with 24/7 (A) from family/friends. Pt is moving well. Will cont to f/u with pt to maximize functional mobility while in acute setting.   Follow Up Recommendations  Home health PT;Supervision/Assistance - 24 hour     Does the patient have the potential to tolerate intense rehabilitation     Barriers to Discharge   none       Equipment Recommendations  Other (comment) (all DME has been delievered )    Recommendations for Other Services    Frequency Min 5X/week   Progress towards PT Goals Progress towards PT goals: Progressing toward goals  Plan Current plan remains appropriate    Precautions / Restrictions Precautions Precautions: Posterior Hip Precaution Comments: pt able to indpendently recall 3/3 hip preacutions Restrictions Weight Bearing Restrictions: Yes LLE Weight Bearing: Weight bearing as tolerated   Pertinent Vitals/Pain No new complaints. Denies pain.     Mobility  Bed Mobility Bed Mobility: Not assessed (pt sitting in chair) Transfers Transfers: Sit to Stand;Stand to Sit Sit to Stand: 6: Modified independent (Device/Increase time);From chair/3-in-1;With armrests Stand to Sit: 6: Modified independent (Device/Increase time);With armrests;Without upper extremity assist;To chair/3-in-1 Details for Transfer Assistance: no cues necessary pt required increased time to complete; good safety awareness demo Ambulation/Gait Ambulation/Gait Assistance: 5: Supervision Ambulation Distance (Feet): 300 Feet Assistive device: Rolling walker Ambulation/Gait Assistance Details: min verbal cues for upright posture and safety with RW Gait Pattern: Step-through pattern;Antalgic;Decreased stride length Gait velocity:  decr due to pain  Stairs: No Wheelchair Mobility Wheelchair Mobility: No    Exercises Total Joint Exercises Ankle Circles/Pumps: AROM;Both;10 reps;Seated Heel Slides: AROM;Both;10 reps;Seated Long Arc Quad: AROM;Both;10 reps;Seated   PT Diagnosis:    PT Problem List:   PT Treatment Interventions:     PT Goals (current goals can now be found in the care plan section) Acute Rehab PT Goals Patient Stated Goal: home today   Visit Information  Last PT Received On: 08/30/12 Assistance Needed: +1 History of Present Illness: 67 y.o. female admitted to [facility] from [facility] (where she was recieving rehab s/p L THA-post prec WBAT) for left hip wound infection.  She underwent I&D of her left hip 08/27/12.     Subjective Data  Subjective: Pt in high hopes to go home today. agreeable to therapy  Patient Stated Goal: home today    Cognition  Cognition Arousal/Alertness: Awake/alert Behavior During Therapy: WFL for tasks assessed/performed Overall Cognitive Status: Within Functional Limits for tasks assessed    Balance     End of Session PT - End of Session Equipment Utilized During Treatment: Gait belt Activity Tolerance: Patient tolerated treatment well Patient left: in chair;with call bell/phone within reach Nurse Communication: Mobility status   GP     Donell Sievert, [place] [phone] 08/30/2012, 10:12 AM

## 2012-08-31 ENCOUNTER — Encounter (HOSPITAL_COMMUNITY): Payer: Self-pay | Admitting: Orthopaedic Surgery

## 2012-08-31 LAB — GLUCOSE, CAPILLARY: Glucose-Capillary: 89 mg/dL (ref 70–99)

## 2012-08-31 MED ORDER — HYDROCODONE-ACETAMINOPHEN 5-325 MG PO TABS: 1.0000 | ORAL_TABLET | ORAL | Status: DC | PRN

## 2012-08-31 MED ORDER — HEPARIN SOD (PORK) LOCK FLUSH 100 UNIT/ML IV SOLN
250.0000 [IU] | INTRAVENOUS | Status: AC | PRN
  Administered 2012-08-31: 500 [IU]

## 2012-08-31 MED ORDER — ASPIRIN EC 325 MG PO TBEC: 325.0000 mg | DELAYED_RELEASE_TABLET | Freq: Two times a day (BID) | ORAL | Status: DC

## 2012-08-31 MED ORDER — VANCOMYCIN HCL 10 G IV SOLR: 1750.0000 mg | INTRAVENOUS | Status: DC

## 2012-08-31 NOTE — Progress Notes (Signed)
Subjective: 3 Days Post-Op Procedure(s) (LRB): Irrigation and Debridement hip  (Left) Patient reports pain as mild.    Objective: Vital signs in last 24 hours: Temp:  [97.3 F (36.3 C)-98.3 F (36.8 C)] 97.3 F (36.3 C) (07/01 1337) Pulse Rate:  [64-80] 80 (07/01 1337) Resp:  [18] 18 (07/01 1337) BP: (113-131)/(66-76) 131/76 mmHg (07/01 1337) SpO2:  [98 %-100 %] 100 % (07/01 1337)  Intake/Output from previous day:   Intake/Output this shift:     Recent Labs  08/30/12 0520  HGB 9.6*    Recent Labs  08/30/12 0520  WBC 6.5  RBC 3.38*  HCT 29.7*  PLT 249    Recent Labs  08/29/12 0545 08/30/12 0520  NA 137 137  K 3.4* 4.1  CL 100 102  CO2 28 28  BUN 12 11  CREATININE 1.15* 0.99  GLUCOSE 90 82  CALCIUM 8.6 9.0   No results found for this basename: LABPT, INR,  in the last 72 hours  Sensation intact distally Intact pulses distally Dorsiflexion/Plantar flexion intact Incision: scant drainage No cellulitis present Compartment soft  Assessment/Plan: 3 Days Post-Op Procedure(s) (LRB): Irrigation and Debridement hip  (Left) Discharge home with home health  Sandra Brennan 08/31/2012, 3:01 PM

## 2012-08-31 NOTE — Discharge Summary (Signed)
Patient ID: Sandra Brennan MRN: [id number] DOB/AGE: 04-09-1945 67 y.o.  Admit date: 08/27/2012 Discharge date: 08/31/2012  Admission Diagnoses:  Principal Problem:   Postoperative wound infection of left hip   Discharge Diagnoses:  Same  Past Medical History  Diagnosis Date  . Hypertension   . Asthma   . Shortness of breath     when stressed.  . Myocardial infarction 1992    09-02-2012 "mild MI when son died "  . GERD (gastroesophageal reflux disease)   . Sleep apnea     "never did fix my machine; haven't used one for 5 years; I've lost 116# since then; no problems now" (September 02, 2012)  . Type II diabetes mellitus     "borderline; don't test; take Metformin" (02-Sep-2012)  . Anemia   . Migraines     "used to; totally stopped when I quit drinking 30 yr ago" (09-02-2012)  . Arthritis     "plenty" (Sep 02, 2012)  . Degenerative arthritis     "all over" (09-02-2012)  . Osteoporosis     "all over" (Sep 02, 2012)  . Chronic lower back pain     "they say I need a whole new spinal column" (09/02/2012)    Surgeries: Procedure(s): Irrigation and Debridement hip  on 08/27/2012 - 08/28/2012   Consultants:    Discharged Condition: Improved  Hospital Course: Sandra Brennan is an 67 y.o. female who was admitted 08/27/2012 for operative treatment ofPostoperative wound infection of left hip. Patient has severe unremitting pain that affects sleep, daily activities, and work/hobbies. After pre-op clearance the patient was taken to the operating room on 08/27/2012 - 08/28/2012 and underwent  Procedure(s): Irrigation and Debridement hip .    Patient was given perioperative antibiotics: Anti-infectives   Start     Dose/Rate Route Frequency Ordered Stop   08/31/12 0400  vancomycin (VANCOCIN) 1,750 mg in sodium chloride 0.9 % 500 mL IVPB     1,750 mg 250 mL/hr over 120 Minutes Intravenous Every 24 hours 08/30/12 2130     08/31/12 0000  sodium chloride 0.9 % SOLN 500 mL with vancomycin 10 G SOLR 1,750 mg     1,750  mg 250 mL/hr over 120 Minutes Intravenous Every 24 hours 08/31/12 1506     08/28/12 0800  vancomycin (VANCOCIN) IVPB 1000 mg/200 mL premix  Status:  Discontinued     1,000 mg 200 mL/hr over 60 Minutes Intravenous Every 12 hours 08/28/12 0253 08/30/12 2125   08/28/12 0600  vancomycin (VANCOCIN) 1,500 mg in sodium chloride 0.9 % 500 mL IVPB     1,500 mg 250 mL/hr over 120 Minutes Intravenous On call to O.R. 08/27/12 1417 08/28/12 0035   08/27/12 2115  vancomycin (VANCOCIN) 500 mg in sodium chloride 0.9 % 100 mL IVPB  Status:  Discontinued     500 mg 100 mL/hr over 60 Minutes Intravenous  Once 08/27/12 2105 08/28/12 0145       Patient was given sequential compression devices, early ambulation, and chemoprophylaxis to prevent DVT.  Patient benefited maximally from hospital stay and there were no complications.    Recent vital signs: Patient Vitals for the past 24 hrs:  BP Temp Temp src Pulse Resp SpO2  08/31/12 1337 131/76 mmHg 97.3 F (36.3 C) - 80 18 100 %  08/31/12 0653 113/66 mmHg 98.3 F (36.8 C) Oral 64 18 98 %  08/30/12 2056 124/70 mmHg 98.3 F (36.8 C) Oral 79 18 98 %     Recent laboratory studies:  Recent Labs  08/29/12 0545  08/30/12 0520  WBC  --  6.5  HGB  --  9.6*  HCT  --  29.7*  PLT  --  249  NA 137 137  K 3.4* 4.1  CL 100 102  CO2 28 28  BUN 12 11  CREATININE 1.15* 0.99  GLUCOSE 90 82  CALCIUM 8.6 9.0     Discharge Medications:     Medication List    STOP taking these medications       methocarbamol 500 MG tablet  Commonly known as:  ROBAXIN     oxyCODONE 5 MG immediate release tablet  Commonly known as:  Oxy IR/ROXICODONE     rivaroxaban 10 MG Tabs tablet  Commonly known as:  XARELTO      TAKE these medications       acetaminophen 500 MG tablet  Commonly known as:  TYLENOL  Take 2 tablets (1,000 mg total) by mouth every 6 (six) hours as needed for pain.     albuterol 108 (90 BASE) MCG/ACT inhaler  Commonly known as:  PROVENTIL  HFA;VENTOLIN HFA  Inhale 2 puffs into the lungs every 6 (six) hours as needed for wheezing.     aspirin EC 325 MG tablet  Take 1 tablet (325 mg total) by mouth 2 (two) times daily after a meal.     cycloSPORINE 0.05 % ophthalmic emulsion  Commonly known as:  RESTASIS  Place 1 drop into both eyes 2 (two) times daily.     gabapentin 800 MG tablet  Commonly known as:  NEURONTIN  Take 800 mg by mouth 2 (two) times daily.     HYDROcodone-acetaminophen 5-325 MG per tablet  Commonly known as:  NORCO  Take 1-2 tablets by mouth every 4 (four) hours as needed for pain.     metFORMIN 500 MG (MOD) 24 hr tablet  Commonly known as:  GLUMETZA  Take 500 mg by mouth daily with breakfast.     omeprazole 40 MG capsule  Commonly known as:  PRILOSEC  Take 40 mg by mouth daily.     potassium chloride SA 20 MEQ tablet  Commonly known as:  K-DUR,KLOR-CON  Take 20 mEq by mouth 2 (two) times daily.     sodium chloride 0.9 % SOLN 500 mL with vancomycin 10 G SOLR 1,750 mg  Inject 1,750 mg into the vein daily.     triamterene-hydrochlorothiazide 37.5-25 MG per tablet  Commonly known as:  MAXZIDE-25  Take 1 tablet by mouth daily.        Diagnostic Studies: Dg Pelvis Portable  08/03/2012   *RADIOLOGY REPORT*  Clinical Data: Postoperative radiographs.  Left total hip arthroplasty.  PORTABLE PELVIS  Comparison: 07/14/2007.  Findings: New left total hip arthroplasties present.  There are no complicating features.  There is a acetabular screw extending through the cup.  Expected postoperative changes in the soft tissues.  Mild to moderate right hip osteoarthritis.  IMPRESSION: Uncomplicated new left total hip arthroplasty.   Original Report Authenticated By: Andreas Newport, M.D.   Dg Hip Portable 1 View Left  08/03/2012   *RADIOLOGY REPORT*  Clinical Data: Postoperative radiographs.  Left total hip arthroplasty.  PORTABLE LEFT HIP - 1 VIEW  Comparison: Frontal view of the hip.  Today.  Findings: New left  total hip arthroplasty appears located.  No complicating features.  Technically suboptimal lateral radiograph.  IMPRESSION: Uncomplicated new left total hip arthroplasty.   Original Report Authenticated By: Andreas Newport, M.D.    Disposition:  To home  Discharge Orders   Future Appointments Provider Department Dept Phone   09/20/2012 11:15 AM Judyann Munson, MD [facility] [phone]   Future Orders Complete By Expires     Call MD / Call 911  As directed     Comments:      If you experience chest pain or shortness of breath, CALL 911 and be transported to the hospital emergency room.  If you develope a fever above 101 F, pus (white drainage) or increased drainage or redness at the wound, or calf pain, call your surgeon's office.    Constipation Prevention  As directed     Comments:      Drink plenty of fluids.  Prune juice may be helpful.  You may use a stool softener, such as Colace (over the counter) 100 mg twice a day.  Use MiraLax (over the counter) for constipation as needed.    Diet - low sodium heart healthy  As directed     Discharge instructions  As directed     Comments:      Increase activities as comfort allows. Keep your incision clean and dry. Wait to get your incision wet in the shower until 09/01/12; then new dry dressing daily.    Discharge patient  As directed     Increase activity slowly as tolerated  As directed        Follow-up Information   Follow up with [facility] Claude Manges, MD. Schedule an appointment as soon as possible for a visit in 2 weeks.   Contact information:   [address] [place] [address] [phone]        Signed: Kathryne Hitch 08/31/2012, 3:06 PM

## 2012-08-31 NOTE — Progress Notes (Signed)
Physical Therapy Treatment Patient Details Name: Sandra Brennan MRN: [id number] DOB: 12/08/1945 Today's Date: 08/31/2012 Time: 4098-1191 PT Time Calculation (min): 19 min  PT Assessment / Plan / Recommendation  PT Comments   Pt progressing well with therapy. Unable to increase theraex due to pain. Pt is safe from mobility standpoint to D/C home with 24/7 (A) from family. Will cont to f/u with pt while in acute setting to maximize functional mobility.   Follow Up Recommendations  Home health PT;Supervision/Assistance - 24 hour     Does the patient have the potential to tolerate intense rehabilitation     Barriers to Discharge        Equipment Recommendations  Other (comment) (all DME has been delievered )    Recommendations for Other Services    Frequency Min 5X/week   Progress towards PT Goals Progress towards PT goals: Progressing toward goals  Plan Current plan remains appropriate    Precautions / Restrictions Precautions Precautions: Posterior Hip Precaution Comments: pt able to indpendently recall 3/3 hip preacutions Restrictions Weight Bearing Restrictions: Yes LLE Weight Bearing: Weight bearing as tolerated   Pertinent Vitals/Pain 7/10; pt medicated by RN during therapy session    Mobility  Bed Mobility Bed Mobility: Sit to Supine Supine to Sit: 5: Supervision;HOB flat Details for Bed Mobility Assistance: supervision for safety and min cues to maintain hip precautions  Transfers Transfers: Sit to Stand;Stand to Sit Sit to Stand: From bed;5: Supervision Stand to Sit: 6: Modified independent (Device/Increase time);To chair/3-in-1;With armrests Details for Transfer Assistance: min cues to maintain hip precautions with sit to stand transfer; good safety awareness with stand to sit transfer  Ambulation/Gait Ambulation/Gait Assistance: 5: Supervision Ambulation Distance (Feet): 200 Feet Assistive device: Rolling walker Ambulation/Gait Assistance Details: min verbal  cues for upright posture and gt sequencing  Gait Pattern: Step-through pattern;Antalgic;Decreased stride length Gait velocity: decr due to pain  Stairs: No Wheelchair Mobility Wheelchair Mobility: No    Exercises Total Joint Exercises Ankle Circles/Pumps: AROM;Both;10 reps;Seated Quad Sets: AROM;10 reps;Both;Seated Gluteal Sets: AROM;10 reps;Seated Long Arc Quad: AROM;Both;10 reps;Seated   PT Diagnosis:    PT Problem List:   PT Treatment Interventions:     PT Goals (current goals can now be found in the care plan section) Acute Rehab PT Goals Patient Stated Goal: home today  PT Goal Formulation: With patient Time For Goal Achievement: 09/04/12 Potential to Achieve Goals: Good  Visit Information  Last PT Received On: 08/31/12 Assistance Needed: +1 History of Present Illness: 67 y.o. female admitted to [facility] from [facility] (where she was recieving rehab s/p L THA-post prec WBAT) for left hip wound infection.  She underwent I&D of her left hip 08/27/12.     Subjective Data  Subjective: pt lying supine; agreeable to therapy.   Patient Stated Goal: home today    Cognition  Cognition Arousal/Alertness: Awake/alert Behavior During Therapy: Flat affect Overall Cognitive Status: Within Functional Limits for tasks assessed    Balance  Balance Balance Assessed: No  End of Session PT - End of Session Equipment Utilized During Treatment: Gait belt Activity Tolerance: Patient tolerated treatment well Patient left: in chair;with call bell/phone within reach Nurse Communication: Mobility status;Patient requests pain meds   GP     Donell Sievert, [place] [phone] 08/31/2012, 8:47 AM

## 2012-08-31 NOTE — Progress Notes (Signed)
Occupational Therapy Treatment Patient Details Name: Sandra Brennan MRN: [id number] DOB: June 24, 1945 Today's Date: 08/31/2012 Time:  -     OT Assessment / Plan / Recommendation  OT comments  Pt progressing well and is safe to d/c home with 24 hour sup/assist from an OT standpoint  Follow Up Recommendations  No OT follow up;Supervision/Assistance - 24 hour    Barriers to Discharge   None    Equipment Recommendations  Tub/shower bench;Other (comment) Lexicographer)    Recommendations for Other Services    Frequency Min 2X/week   Progress towards OT Goals Progress towards OT goals: Progressing toward goals  Plan Discharge plan remains appropriate    Precautions / Restrictions Precautions Precautions: Posterior Hip Precaution Comments: pt able to indpendently recall 3/3 hip preacutions Restrictions Weight Bearing Restrictions: Yes LLE Weight Bearing: Weight bearing as tolerated   Pertinent Vitals/Pain 4/10    ADL  Tub/Shower Transfer: Performed;Min guard;Supervision/safety Tub/Shower Transfer Method: Science writer: Counsellor Used: Rolling walker;Other (comment);Gait belt (tub bench) ADL Comments: Reviewed ADL A/E and tub bench use with pt. Pt states that she has tub bench at home and has A/E    OT Diagnosis:    OT Problem List:   OT Treatment Interventions:     OT Goals(current goals can now be found in the care plan section) Acute Rehab OT Goals Patient Stated Goal: " I hope to go home this afternoon "  Visit Information  Last OT Received On: 08/31/12 Assistance Needed: +1 History of Present Illness: 67 y.o. female admitted to [facility] from [facility] (where she was recieving rehab s/p L THA-post prec WBAT) for left hip wound infection.  She underwent I&D of her left hip 08/27/12.     Subjective Data      Prior Functioning       Cognition  Cognition Arousal/Alertness: Awake/alert Behavior During Therapy: WFL for tasks  assessed/performed Overall Cognitive Status: Within Functional Limits for tasks assessed    Mobility  Bed Mobility Bed Mobility: Sit to Supine Supine to Sit: 5: Supervision Sitting - Scoot to Edge of Bed: 5: Supervision Sit to Supine: 5: Supervision Scooting to [facility]: 5: Supervision Details for Bed Mobility Assistance: supervision for safety and min cues to maintain hip precautions  Transfers Transfers: Sit to Stand;Stand to Sit Sit to Stand: From bed;5: Supervision;Other (comment) (tub bench) Stand to Sit: 5: Supervision;4: Min guard;Other (comment);To bed (tub bench) Details for Transfer Assistance: min cues to maintain hip precautions with sit to stand transfer; good safety awareness with stand to sit transfer     Exercises      Balance Balance Balance Assessed: No   End of Session OT - End of Session Equipment Utilized During Treatment: Rolling walker;Gait belt;Other (comment) (tub bench, A/E) Activity Tolerance: Patient tolerated treatment well Patient left: in bed;with call bell/phone within reach;with family/visitor present  GO     Galen Manila 08/31/2012, 1:27 PM

## 2012-08-31 NOTE — Progress Notes (Signed)
[facility]  Patient Status:New pt for [facility] this admission.   Services Provided by Agency:    [facility] will provide HH SN, Home Infusion Pharmacy and PT upon DC home.  [facility] hospital team will follow and support transition to home when deemed appropriate by physician team.  If patient discharges after hours, please call [phone].   Sedalia Muta 08/31/2012, 9:16 AM

## 2012-08-31 NOTE — Progress Notes (Signed)
Pt discharged to home accompanied by her daughter at approx [phone]

## 2012-09-01 DIAGNOSIS — Z79899 Other long term (current) drug therapy: Secondary | ICD-10-CM | POA: Diagnosis not present

## 2012-09-01 DIAGNOSIS — Z96649 Presence of unspecified artificial hip joint: Secondary | ICD-10-CM | POA: Diagnosis not present

## 2012-09-01 DIAGNOSIS — E669 Obesity, unspecified: Secondary | ICD-10-CM | POA: Diagnosis not present

## 2012-09-01 DIAGNOSIS — Z452 Encounter for adjustment and management of vascular access device: Secondary | ICD-10-CM | POA: Diagnosis not present

## 2012-09-01 DIAGNOSIS — T8140XA Infection following a procedure, unspecified, initial encounter: Secondary | ICD-10-CM | POA: Diagnosis not present

## 2012-09-01 DIAGNOSIS — T8450XA Infection and inflammatory reaction due to unspecified internal joint prosthesis, initial encounter: Secondary | ICD-10-CM | POA: Diagnosis not present

## 2012-09-01 DIAGNOSIS — M169 Osteoarthritis of hip, unspecified: Secondary | ICD-10-CM | POA: Diagnosis not present

## 2012-09-02 LAB — ANAEROBIC CULTURE

## 2012-09-05 ENCOUNTER — Encounter: Payer: Self-pay | Admitting: Adult Health

## 2012-09-05 DIAGNOSIS — M109 Gout, unspecified: Secondary | ICD-10-CM | POA: Insufficient documentation

## 2012-09-05 NOTE — Progress Notes (Signed)
  Subjective:    Patient ID: Sandra Brennan, female    DOB: 04/22/1945, 67 y.o.   MRN: [id number]  HPI This is a 67 year old female who was noted to have left hip surgical wound drainage, moderate amount of serosanguineous drainage. WBC 11.8 - elevated and uric acid 9.1 -  elevated. No fever reported. She has been admitted to [facility] place on 08/06/12 with osteoarthritis is status post left total hip arthroplasty. She complained of left foot tenderness. Left foot is not warm to touch, edema, 1+ and no redness noted.   Review of Systems  Constitutional: Negative for fever and chills.  HENT: Negative.   Eyes: Negative.   Respiratory: Negative for cough and shortness of breath.   Cardiovascular: Positive for leg swelling.  Gastrointestinal: Negative for abdominal pain and abdominal distention.  Endocrine: Negative.   Genitourinary: Negative.   Skin: Positive for wound. Negative for color change and rash.  Neurological: Negative.   Hematological: Negative for adenopathy. Does not bruise/bleed easily.  Psychiatric/Behavioral: Negative.        Objective:   Physical Exam  Nursing note and vitals reviewed. Constitutional: She appears well-developed and well-nourished.  HENT:  Head: Normocephalic and atraumatic.  Right Ear: External ear normal.  Left Ear: External ear normal.  Nose: Nose normal.  Mouth/Throat: Oropharynx is clear and moist.  Eyes: Conjunctivae are normal. Pupils are equal, round, and reactive to light.  Neck: Normal range of motion. Neck supple. No thyromegaly present.  Cardiovascular: Normal rate, regular rhythm, normal heart sounds and intact distal pulses.   Pulmonary/Chest: Effort normal and breath sounds normal. No respiratory distress.  Abdominal: Soft. Bowel sounds are normal. She exhibits no distension.  Musculoskeletal: She exhibits edema and tenderness.  Skin: Skin is warm and dry. No erythema.  Left hip surgical wound has moderate serosanguinous drainage Left  foot has edema, 1+, tender, no rash  Psychiatric: She has a normal mood and affect. Her behavior is normal. Judgment and thought content normal.     LABS/PROCEDURES: 08/24/12  WBC 11.8 hemoglobin 8.4 hematocrit 25.9 08/23/12  uric acid 9.1 08/18/12  chest x-ray is negative for pneumonitis 08/13/12  right shoulder x-ray shows moderate to prominent osteoarthritis changes right shoulder 08/09/12  WBC 7.4 hemoglobin 7.6 hematocrit 33.4 sodium 139 potassium 4.5 glucose 102 BUN 13 creatinine 0.92 calcium 8.4    Medications reviewed.      Assessment & Plan:   Osteoarthritis S/P Left Total Hip Arthroplasty - continue PT/OT  Gout - start Colcrys 0.6 mg PO BID x 2 weeks  Left hip surgical wound infection - wound  Drainage culture with sensitivity then start Doxycycline 100 mg 1 PO BID x 14 days; make a follow-up appointment to see orthopedic surgeon ASAP

## 2012-09-06 DIAGNOSIS — T8140XA Infection following a procedure, unspecified, initial encounter: Secondary | ICD-10-CM | POA: Diagnosis not present

## 2012-09-06 DIAGNOSIS — Z452 Encounter for adjustment and management of vascular access device: Secondary | ICD-10-CM | POA: Diagnosis not present

## 2012-09-06 DIAGNOSIS — Z79899 Other long term (current) drug therapy: Secondary | ICD-10-CM | POA: Diagnosis not present

## 2012-09-06 DIAGNOSIS — E669 Obesity, unspecified: Secondary | ICD-10-CM | POA: Diagnosis not present

## 2012-09-06 DIAGNOSIS — D62 Acute posthemorrhagic anemia: Secondary | ICD-10-CM | POA: Insufficient documentation

## 2012-09-06 DIAGNOSIS — Z792 Long term (current) use of antibiotics: Secondary | ICD-10-CM | POA: Diagnosis not present

## 2012-09-06 DIAGNOSIS — Z96649 Presence of unspecified artificial hip joint: Secondary | ICD-10-CM | POA: Diagnosis not present

## 2012-09-06 DIAGNOSIS — T8450XA Infection and inflammatory reaction due to unspecified internal joint prosthesis, initial encounter: Secondary | ICD-10-CM | POA: Diagnosis not present

## 2012-09-06 HISTORY — DX: Acute posthemorrhagic anemia: D62

## 2012-09-06 NOTE — Progress Notes (Signed)
Patient ID: Sandra Brennan, female   DOB: 01-28-1946, 67 y.o.   MRN: [id number]        HISTORY & PHYSICAL  DATE: 08/12/2012   FACILITY: [facility]  LEVEL OF CARE: SNF (31)  ALLERGIES:  Allergies  Allergen Reactions  . Penicillins Hives  . Sulfa Antibiotics     unknown  . Theophyllines Hives    CHIEF COMPLAINT:  Manage left hip osteoarthritis, diabetes mellitus, and asthma.    HISTORY OF PRESENT ILLNESS:  The patient is a 67 year-old, African-American female.   HIP OSTEOARTHRITIS: patient had advanced end stage OA of the hip with progressively worsening pain & dysfunction.  Pt failed non-surgical conservative management.  X-ray of the left hip showed peripheral osteophytes.  Therefore pt underwent total hip arthroplasty & tolerated the procedure well.  Pt denies hip pain currently.  Pt was admitted to this facility for short term rehabilitation.    DM:pt's DM remains stable.  Pt denies polyuria, polydipsia, polyphagia, changes in vision or hypoglycemic episodes.  No complications noted from the medication presently being used.  Last hemoglobin A1c is: A recent  hemoglobin A1C is not available.   ASTHMA: The patient's asthma remains stable. Patient denies shortness of breath, dyspnea on exertion or wheezing. No complications reported from the medications currently being used.   PAST MEDICAL HISTORY :  Past Medical History  Diagnosis Date  . Hypertension   . Asthma   . Shortness of breath     when stressed.  . Myocardial infarction 1992    August 19, 2012 "mild MI when son died "  . GERD (gastroesophageal reflux disease)   . Sleep apnea     "never did fix my machine; haven't used one for 5 years; I've lost 116# since then; no problems now" (08-19-12)  . Type II diabetes mellitus     "borderline; don't test; take Metformin" (08/19/12)  . Anemia   . Migraines     "used to; totally stopped when I quit drinking 30 yr ago" (08/19/12)  . Arthritis     "plenty"  (08-19-2012)  . Degenerative arthritis     "all over" (19-Aug-2012)  . Osteoporosis     "all over" (19-Aug-2012)  . Chronic lower back pain     "they say I need a whole new spinal column" (2012/08/19)    PAST SURGICAL HISTORY: Past Surgical History  Procedure Laterality Date  . Ganglion cyst excision Right 1980's    "wrist" (Aug 19, 2012)  . Knee ligament reconstruction Right 1980's  . Total hip arthroplasty Left 08/19/2012  . Tonsillectomy  ~ 1954  . Total knee arthroplasty Left 2001  . Total knee arthroplasty Right 2004  . Joint replacement    . Abdominal hysterectomy  1990's  . Total hip arthroplasty Left 08/19/2012    Procedure: TOTAL HIP ARTHROPLASTY;  Surgeon: [name], MD;  Location: [facility] OR;  Service: Orthopedics;  Laterality: Left;  Left Total Hip Arthroplasty  . Total hip arthroplasty Left 08/28/2012    Procedure: Irrigation and Debridement hip ;  Surgeon: Kathryne Hitch, MD;  Location: [facility] OR;  Service: Orthopedics;  Laterality: Left;    SOCIAL HISTORY:  reports that she has quit smoking. Her smoking use included Cigarettes. She has a .24 pack-year smoking history. She has never used smokeless tobacco. She reports that she does not drink alcohol or use illicit drugs.  FAMILY HISTORY:  Family History  Problem Relation Age of Onset  . Arthritis Mother   .  Arthritis Father   . Diabetes Sister   . Arthritis Sister   . Diabetes Maternal Grandmother   . Arthritis Maternal Grandmother   . Diabetes Maternal Grandfather   . Arthritis Maternal Grandfather     CURRENT MEDICATIONS: Reviewed per [facility]  REVIEW OF SYSTEMS:   MUSCULOSKELETAL:  Patient is complaining of severe right shoulder pain which is sudden onset.  She woke up with the pain this morning.  Denies radiation or any neurological symptoms.    See HPI otherwise 14 point ROS is negative.  PHYSICAL EXAMINATION  VS:  T 99.9       P 107      RR 20      BP 118/77      POX 93%        WT (Lb)  GENERAL: no acute  distress, morbidly obese body habitus SKIN: warm & dry, no suspicious lesions or rashes, no excessive dryness, left hip incision clean and dry, staples are in place  EYES: conjunctivae normal, sclerae normal, normal eye lids MOUTH/THROAT: lips without lesions,no lesions in the mouth,tongue is without lesions,uvula elevates in midline NECK: supple, trachea midline, no neck masses, no thyroid tenderness, no thyromegaly LYMPHATICS: no LAN in the neck, no supraclavicular LAN RESPIRATORY: breathing is even & unlabored, BS CTAB CARDIAC: RRR, no murmur,no extra heart sounds, no edema GI:  ABDOMEN: abdomen soft, normal BS, no masses, no tenderness  LIVER/SPLEEN: no hepatomegaly, no splenomegaly MUSCULOSKELETAL: HEAD: normal to inspection & palpation BACK: no kyphosis, scoliosis or spinal processes tenderness EXTREMITIES: LEFT UPPER EXTREMITY: full range of motion, normal strength & tone RIGHT UPPER EXTREMITY: strength and range of motion unable to assess due to severe shoulder pain LEFT LOWER EXTREMITY: strength intact, range of motion not tested due to surgery  RIGHT LOWER EXTREMITY: strength intact, range of motion moderate  PSYCHIATRIC: the patient is alert & oriented to person, affect & behavior appropriate  LABS/RADIOLOGY: Hemoglobin 7.6, MCV 87.3, otherwise CBC normal.    BMP normal.    At discharge:  Hemoglobin 7.9.    Chest x-ray did not show any acute findings.     There is avascular necrosis involving the bilateral femoral heads.  Age-indeterminate compression deformity of the lower thoracic vertebral body.    Right ankle x-ray did not show any acute findings.    Pelvic x-ray and left hip x-ray postsurgically showed uncomplicated new left total hip arthroplasty.    ASSESSMENT/PLAN:  Left hip osteoarthritis.  Status post left total hip arthroplasty.  Continue rehabilitation.   Diabetes mellitus.  Continue current medications.   Asthma.  Well compensated.    Acute blood  loss anemia.  Unstable problem.  Hemoglobin declined.  Iron was started.  Recheck pending.   Severe right shoulder pain.  Likely secondary to avascular necrosis.  Patient was placed on a prednisone taper since patient says, in a prior episode, this intervention relieved the pain.    Hypertension.  Well controlled.    Coronary artery disease.  Stable.   I have reviewed patient's medical records received at admission/from hospitalization.  CPT CODE: 40981

## 2012-09-07 LAB — CULTURE, ROUTINE-ABSCESS

## 2012-09-07 NOTE — Progress Notes (Signed)
Patient ID: Sandra Brennan, female   DOB: [birth year], 67 y.o.   MRN: [id number] Although my operative note explains fully the surgery that was performed, I need to provide additional "wording" that helps "capture" the full extent of the patients irrigation and debridement.  As the Op Note states, a sharp debridement was performed with a scapel removing skin and soft tissue only (necrotic adipose tissue).  As the Op Note also states, this was done down to the level of the IT band, which means deep.

## 2012-09-08 DIAGNOSIS — E669 Obesity, unspecified: Secondary | ICD-10-CM | POA: Diagnosis not present

## 2012-09-08 DIAGNOSIS — Z79899 Other long term (current) drug therapy: Secondary | ICD-10-CM | POA: Diagnosis not present

## 2012-09-08 DIAGNOSIS — Z452 Encounter for adjustment and management of vascular access device: Secondary | ICD-10-CM | POA: Diagnosis not present

## 2012-09-08 DIAGNOSIS — T8450XA Infection and inflammatory reaction due to unspecified internal joint prosthesis, initial encounter: Secondary | ICD-10-CM | POA: Diagnosis not present

## 2012-09-08 DIAGNOSIS — Z96649 Presence of unspecified artificial hip joint: Secondary | ICD-10-CM | POA: Diagnosis not present

## 2012-09-08 DIAGNOSIS — T8140XA Infection following a procedure, unspecified, initial encounter: Secondary | ICD-10-CM | POA: Diagnosis not present

## 2012-09-13 DIAGNOSIS — T8140XA Infection following a procedure, unspecified, initial encounter: Secondary | ICD-10-CM | POA: Diagnosis not present

## 2012-09-13 DIAGNOSIS — E669 Obesity, unspecified: Secondary | ICD-10-CM | POA: Diagnosis not present

## 2012-09-13 DIAGNOSIS — Z452 Encounter for adjustment and management of vascular access device: Secondary | ICD-10-CM | POA: Diagnosis not present

## 2012-09-13 DIAGNOSIS — T8450XA Infection and inflammatory reaction due to unspecified internal joint prosthesis, initial encounter: Secondary | ICD-10-CM | POA: Diagnosis not present

## 2012-09-13 DIAGNOSIS — Z79899 Other long term (current) drug therapy: Secondary | ICD-10-CM | POA: Diagnosis not present

## 2012-09-13 DIAGNOSIS — Z96649 Presence of unspecified artificial hip joint: Secondary | ICD-10-CM | POA: Diagnosis not present

## 2012-09-14 DIAGNOSIS — E669 Obesity, unspecified: Secondary | ICD-10-CM | POA: Diagnosis not present

## 2012-09-14 DIAGNOSIS — Z79899 Other long term (current) drug therapy: Secondary | ICD-10-CM | POA: Diagnosis not present

## 2012-09-14 DIAGNOSIS — Z96649 Presence of unspecified artificial hip joint: Secondary | ICD-10-CM | POA: Diagnosis not present

## 2012-09-14 DIAGNOSIS — T8450XA Infection and inflammatory reaction due to unspecified internal joint prosthesis, initial encounter: Secondary | ICD-10-CM | POA: Diagnosis not present

## 2012-09-14 DIAGNOSIS — Z452 Encounter for adjustment and management of vascular access device: Secondary | ICD-10-CM | POA: Diagnosis not present

## 2012-09-14 DIAGNOSIS — T8140XA Infection following a procedure, unspecified, initial encounter: Secondary | ICD-10-CM | POA: Diagnosis not present

## 2012-09-16 DIAGNOSIS — E669 Obesity, unspecified: Secondary | ICD-10-CM | POA: Diagnosis not present

## 2012-09-16 DIAGNOSIS — Z96649 Presence of unspecified artificial hip joint: Secondary | ICD-10-CM | POA: Diagnosis not present

## 2012-09-16 DIAGNOSIS — T8450XA Infection and inflammatory reaction due to unspecified internal joint prosthesis, initial encounter: Secondary | ICD-10-CM | POA: Diagnosis not present

## 2012-09-16 DIAGNOSIS — Z452 Encounter for adjustment and management of vascular access device: Secondary | ICD-10-CM | POA: Diagnosis not present

## 2012-09-16 DIAGNOSIS — Z79899 Other long term (current) drug therapy: Secondary | ICD-10-CM | POA: Diagnosis not present

## 2012-09-16 DIAGNOSIS — T8140XA Infection following a procedure, unspecified, initial encounter: Secondary | ICD-10-CM | POA: Diagnosis not present

## 2012-09-20 ENCOUNTER — Ambulatory Visit (INDEPENDENT_AMBULATORY_CARE_PROVIDER_SITE_OTHER): Payer: Medicare Other | Admitting: Internal Medicine

## 2012-09-20 VITALS — BP 125/82 | HR 81 | Temp 98.4°F | Wt 220.0 lb

## 2012-09-20 DIAGNOSIS — T8140XA Infection following a procedure, unspecified, initial encounter: Secondary | ICD-10-CM | POA: Diagnosis not present

## 2012-09-20 DIAGNOSIS — A499 Bacterial infection, unspecified: Secondary | ICD-10-CM | POA: Diagnosis not present

## 2012-09-20 DIAGNOSIS — B9689 Other specified bacterial agents as the cause of diseases classified elsewhere: Secondary | ICD-10-CM

## 2012-09-20 DIAGNOSIS — L089 Local infection of the skin and subcutaneous tissue, unspecified: Secondary | ICD-10-CM

## 2012-09-20 DIAGNOSIS — Z96649 Presence of unspecified artificial hip joint: Secondary | ICD-10-CM | POA: Diagnosis not present

## 2012-09-20 DIAGNOSIS — Z452 Encounter for adjustment and management of vascular access device: Secondary | ICD-10-CM | POA: Diagnosis not present

## 2012-09-20 DIAGNOSIS — Z79899 Other long term (current) drug therapy: Secondary | ICD-10-CM | POA: Diagnosis not present

## 2012-09-20 DIAGNOSIS — T8450XA Infection and inflammatory reaction due to unspecified internal joint prosthesis, initial encounter: Secondary | ICD-10-CM | POA: Diagnosis not present

## 2012-09-20 DIAGNOSIS — E669 Obesity, unspecified: Secondary | ICD-10-CM | POA: Diagnosis not present

## 2012-09-20 LAB — SEDIMENTATION RATE: Sed Rate: 27 mm/hr — ABNORMAL HIGH (ref 0–22)

## 2012-09-20 NOTE — Progress Notes (Signed)
Per Dr Drue Second PICC removed from patient's right arm.   43 cm PICC removed from right arm. No sutures present.  Site unremarkable.  Petroleum dressing applied to PICC site, patient advised no heavy lifting and dressing is to remain for 24 hours before removal. Call the office is site becomes soaked with blood or extreme pain present.     Laurell Josephs, RN

## 2012-09-20 NOTE — Progress Notes (Signed)
[facility] NOTE  RFV: follow up for incisional site infection Subjective:    Patient ID: Sandra Brennan, female    DOB: Sep 23, 1945, 66 y.o.   MRN: [id number]  HPI  67yo F with Left THA on 6/3 complicated incisional site superficial infection s/p I x D on 6/20. OR cultures grew out corynebacterium striatum. Placed on vancomycin for 4 wks.  Current Outpatient Prescriptions on File Prior to Visit  Medication Sig Dispense Refill  . acetaminophen (TYLENOL) 500 MG tablet Take 2 tablets (1,000 mg total) by mouth every 6 (six) hours as needed for pain.  30 tablet  0  . albuterol (PROVENTIL HFA;VENTOLIN HFA) 108 (90 BASE) MCG/ACT inhaler Inhale 2 puffs into the lungs every 6 (six) hours as needed for wheezing.      Marland Kitchen aspirin EC 325 MG tablet Take 1 tablet (325 mg total) by mouth 2 (two) times daily after a meal.  60 tablet  0  . cycloSPORINE (RESTASIS) 0.05 % ophthalmic emulsion Place 1 drop into both eyes 2 (two) times daily.      Marland Kitchen gabapentin (NEURONTIN) 800 MG tablet Take 800 mg by mouth 2 (two) times daily.      Marland Kitchen HYDROcodone-acetaminophen (NORCO) 5-325 MG per tablet Take 1-2 tablets by mouth every 4 (four) hours as needed for pain.  60 tablet  0  . metFORMIN (GLUMETZA) 500 MG (MOD) 24 hr tablet Take 500 mg by mouth daily with breakfast.      . omeprazole (PRILOSEC) 40 MG capsule Take 40 mg by mouth daily.      . potassium chloride SA (K-DUR,KLOR-CON) 20 MEQ tablet Take 20 mEq by mouth 2 (two) times daily.      . sodium chloride 0.9 % SOLN 500 mL with vancomycin 10 G SOLR 1,750 mg Inject 1,750 mg into the vein daily.  1750 mg  30  . triamterene-hydrochlorothiazide (MAXZIDE-25) 37.5-25 MG per tablet Take 1 tablet by mouth daily.       No current facility-administered medications on file prior to visit.   Active Ambulatory Problems    Diagnosis Date Noted  . Osteoarthritis of left hip 08/05/2012  . Diabetes mellitus type 2 in obese 08/05/2012  . Hypertension 08/05/2012  . Asthma, chronic  08/05/2012  . Sleep apnea 08/05/2012  . Postoperative wound infection of left hip 08/27/2012  . Gout 09/05/2012  . Acute posthemorrhagic anemia 09/06/2012   Resolved Ambulatory Problems    Diagnosis Date Noted  . No Resolved Ambulatory Problems   Past Medical History  Diagnosis Date  . Asthma   . Shortness of breath   . Myocardial infarction 1992  . GERD (gastroesophageal reflux disease)   . Type II diabetes mellitus   . Anemia   . Migraines   . Arthritis   . Degenerative arthritis   . Osteoporosis   . Chronic lower back pain       Review of Systems 12 point ROS is negative other than having recovered from recent gouty flare    Objective:   Physical Exam BP 125/82  Pulse 81  Temp(Src) 98.4 F (36.9 C) (Oral)  Wt 220 lb (99.791 kg)  BMI 42.97 kg/m2 Hip = left hip incision is completely healed Ext: right upper ext PICC line c/d/i  Labs: Lab Results  Component Value Date   ESRSEDRATE 27* 09/20/2012   Lab Results  Component Value Date   CRP 0.7* 09/20/2012       Assessment & Plan:   incisional site infection with corynebacterium =  completed 4 wks of vanco which should be sufficient. Will check sed rate and crp. Which may still be elevated since she is being treated for gout flare. She is seeing her pcp about it. If significantly elevated, would consider repeat labs after her flare is over vs. Giving oral meds of doxycycline 100mg  BID. Will pull picc line today  Slight elevation due to recent gout flare. No need for further antibiotics

## 2012-09-21 LAB — MISCELLANEOUS TEST

## 2012-09-21 LAB — C-REACTIVE PROTEIN: CRP: 0.7 mg/dL — ABNORMAL HIGH (ref <0.60)

## 2012-09-22 DIAGNOSIS — Z452 Encounter for adjustment and management of vascular access device: Secondary | ICD-10-CM | POA: Diagnosis not present

## 2012-09-22 DIAGNOSIS — T8140XA Infection following a procedure, unspecified, initial encounter: Secondary | ICD-10-CM | POA: Diagnosis not present

## 2012-09-22 DIAGNOSIS — T8450XA Infection and inflammatory reaction due to unspecified internal joint prosthesis, initial encounter: Secondary | ICD-10-CM | POA: Diagnosis not present

## 2012-09-22 DIAGNOSIS — E669 Obesity, unspecified: Secondary | ICD-10-CM | POA: Diagnosis not present

## 2012-09-22 DIAGNOSIS — Z96649 Presence of unspecified artificial hip joint: Secondary | ICD-10-CM | POA: Diagnosis not present

## 2012-09-22 DIAGNOSIS — Z79899 Other long term (current) drug therapy: Secondary | ICD-10-CM | POA: Diagnosis not present

## 2012-09-24 DIAGNOSIS — Z452 Encounter for adjustment and management of vascular access device: Secondary | ICD-10-CM | POA: Diagnosis not present

## 2012-09-24 DIAGNOSIS — Z79899 Other long term (current) drug therapy: Secondary | ICD-10-CM | POA: Diagnosis not present

## 2012-09-24 DIAGNOSIS — E669 Obesity, unspecified: Secondary | ICD-10-CM | POA: Diagnosis not present

## 2012-09-24 DIAGNOSIS — Z96649 Presence of unspecified artificial hip joint: Secondary | ICD-10-CM | POA: Diagnosis not present

## 2012-09-24 DIAGNOSIS — T8140XA Infection following a procedure, unspecified, initial encounter: Secondary | ICD-10-CM | POA: Diagnosis not present

## 2012-09-24 DIAGNOSIS — T8450XA Infection and inflammatory reaction due to unspecified internal joint prosthesis, initial encounter: Secondary | ICD-10-CM | POA: Diagnosis not present

## 2012-09-28 DIAGNOSIS — E1149 Type 2 diabetes mellitus with other diabetic neurological complication: Secondary | ICD-10-CM | POA: Diagnosis not present

## 2012-09-28 DIAGNOSIS — J449 Chronic obstructive pulmonary disease, unspecified: Secondary | ICD-10-CM | POA: Diagnosis not present

## 2012-09-28 DIAGNOSIS — I119 Hypertensive heart disease without heart failure: Secondary | ICD-10-CM | POA: Diagnosis not present

## 2012-09-28 DIAGNOSIS — M2550 Pain in unspecified joint: Secondary | ICD-10-CM | POA: Diagnosis not present

## 2012-11-11 DIAGNOSIS — Z1231 Encounter for screening mammogram for malignant neoplasm of breast: Secondary | ICD-10-CM | POA: Diagnosis not present

## 2012-11-23 DIAGNOSIS — H04129 Dry eye syndrome of unspecified lacrimal gland: Secondary | ICD-10-CM | POA: Diagnosis not present

## 2012-11-23 DIAGNOSIS — E119 Type 2 diabetes mellitus without complications: Secondary | ICD-10-CM | POA: Diagnosis not present

## 2012-11-25 DIAGNOSIS — Z124 Encounter for screening for malignant neoplasm of cervix: Secondary | ICD-10-CM | POA: Diagnosis not present

## 2012-11-29 DIAGNOSIS — L02419 Cutaneous abscess of limb, unspecified: Secondary | ICD-10-CM | POA: Diagnosis not present

## 2012-11-29 DIAGNOSIS — T84498A Other mechanical complication of other internal orthopedic devices, implants and grafts, initial encounter: Secondary | ICD-10-CM | POA: Diagnosis not present

## 2012-12-07 DIAGNOSIS — M899 Disorder of bone, unspecified: Secondary | ICD-10-CM | POA: Diagnosis not present

## 2013-01-18 DIAGNOSIS — L299 Pruritus, unspecified: Secondary | ICD-10-CM | POA: Diagnosis not present

## 2013-01-18 DIAGNOSIS — J449 Chronic obstructive pulmonary disease, unspecified: Secondary | ICD-10-CM | POA: Diagnosis not present

## 2013-01-18 DIAGNOSIS — E1149 Type 2 diabetes mellitus with other diabetic neurological complication: Secondary | ICD-10-CM | POA: Diagnosis not present

## 2013-01-18 DIAGNOSIS — I119 Hypertensive heart disease without heart failure: Secondary | ICD-10-CM | POA: Diagnosis not present

## 2013-01-18 DIAGNOSIS — R071 Chest pain on breathing: Secondary | ICD-10-CM | POA: Diagnosis not present

## 2013-01-18 DIAGNOSIS — E119 Type 2 diabetes mellitus without complications: Secondary | ICD-10-CM | POA: Diagnosis not present

## 2013-01-18 DIAGNOSIS — Z006 Encounter for examination for normal comparison and control in clinical research program: Secondary | ICD-10-CM | POA: Diagnosis not present

## 2013-05-03 DIAGNOSIS — L299 Pruritus, unspecified: Secondary | ICD-10-CM | POA: Diagnosis not present

## 2013-05-03 DIAGNOSIS — J449 Chronic obstructive pulmonary disease, unspecified: Secondary | ICD-10-CM | POA: Diagnosis not present

## 2013-05-03 DIAGNOSIS — I119 Hypertensive heart disease without heart failure: Secondary | ICD-10-CM | POA: Diagnosis not present

## 2013-05-03 DIAGNOSIS — G43909 Migraine, unspecified, not intractable, without status migrainosus: Secondary | ICD-10-CM | POA: Diagnosis not present

## 2013-05-05 ENCOUNTER — Encounter (HOSPITAL_COMMUNITY): Payer: Self-pay | Admitting: Emergency Medicine

## 2013-05-05 ENCOUNTER — Inpatient Hospital Stay (HOSPITAL_COMMUNITY)
Admission: EM | Admit: 2013-05-05 | Discharge: 2013-05-07 | DRG: 392 | Disposition: A | Discharge status: Home / Self Care | Payer: Medicare Other | Attending: Internal Medicine | Admitting: Internal Medicine

## 2013-05-05 ENCOUNTER — Emergency Department (HOSPITAL_COMMUNITY): Payer: Medicare Other

## 2013-05-05 DIAGNOSIS — K219 Gastro-esophageal reflux disease without esophagitis: Secondary | ICD-10-CM | POA: Diagnosis present

## 2013-05-05 DIAGNOSIS — E1169 Type 2 diabetes mellitus with other specified complication: Secondary | ICD-10-CM

## 2013-05-05 DIAGNOSIS — E860 Dehydration: Secondary | ICD-10-CM | POA: Diagnosis not present

## 2013-05-05 DIAGNOSIS — E872 Acidosis, unspecified: Secondary | ICD-10-CM | POA: Diagnosis present

## 2013-05-05 DIAGNOSIS — G4730 Sleep apnea, unspecified: Secondary | ICD-10-CM | POA: Diagnosis present

## 2013-05-05 DIAGNOSIS — A088 Other specified intestinal infections: Principal | ICD-10-CM | POA: Diagnosis present

## 2013-05-05 DIAGNOSIS — Z7982 Long term (current) use of aspirin: Secondary | ICD-10-CM

## 2013-05-05 DIAGNOSIS — M810 Age-related osteoporosis without current pathological fracture: Secondary | ICD-10-CM | POA: Diagnosis present

## 2013-05-05 DIAGNOSIS — J45909 Unspecified asthma, uncomplicated: Secondary | ICD-10-CM | POA: Diagnosis present

## 2013-05-05 DIAGNOSIS — Z87891 Personal history of nicotine dependence: Secondary | ICD-10-CM

## 2013-05-05 DIAGNOSIS — Z66 Do not resuscitate: Secondary | ICD-10-CM | POA: Diagnosis present

## 2013-05-05 DIAGNOSIS — E669 Obesity, unspecified: Secondary | ICD-10-CM | POA: Diagnosis present

## 2013-05-05 DIAGNOSIS — G4733 Obstructive sleep apnea (adult) (pediatric): Secondary | ICD-10-CM | POA: Diagnosis present

## 2013-05-05 DIAGNOSIS — I10 Essential (primary) hypertension: Secondary | ICD-10-CM | POA: Diagnosis present

## 2013-05-05 DIAGNOSIS — T3995XA Adverse effect of unspecified nonopioid analgesic, antipyretic and antirheumatic, initial encounter: Secondary | ICD-10-CM | POA: Diagnosis present

## 2013-05-05 DIAGNOSIS — D62 Acute posthemorrhagic anemia: Secondary | ICD-10-CM | POA: Diagnosis not present

## 2013-05-05 DIAGNOSIS — R197 Diarrhea, unspecified: Secondary | ICD-10-CM | POA: Diagnosis not present

## 2013-05-05 DIAGNOSIS — N281 Cyst of kidney, acquired: Secondary | ICD-10-CM | POA: Diagnosis not present

## 2013-05-05 DIAGNOSIS — K828 Other specified diseases of gallbladder: Secondary | ICD-10-CM | POA: Diagnosis not present

## 2013-05-05 DIAGNOSIS — A084 Viral intestinal infection, unspecified: Secondary | ICD-10-CM | POA: Diagnosis present

## 2013-05-05 DIAGNOSIS — Z96659 Presence of unspecified artificial knee joint: Secondary | ICD-10-CM

## 2013-05-05 DIAGNOSIS — E1149 Type 2 diabetes mellitus with other diabetic neurological complication: Secondary | ICD-10-CM | POA: Diagnosis present

## 2013-05-05 DIAGNOSIS — Z96649 Presence of unspecified artificial hip joint: Secondary | ICD-10-CM | POA: Diagnosis not present

## 2013-05-05 DIAGNOSIS — E869 Volume depletion, unspecified: Secondary | ICD-10-CM | POA: Diagnosis present

## 2013-05-05 DIAGNOSIS — E1142 Type 2 diabetes mellitus with diabetic polyneuropathy: Secondary | ICD-10-CM | POA: Diagnosis present

## 2013-05-05 DIAGNOSIS — M1612 Unilateral primary osteoarthritis, left hip: Secondary | ICD-10-CM | POA: Diagnosis present

## 2013-05-05 DIAGNOSIS — N179 Acute kidney failure, unspecified: Secondary | ICD-10-CM | POA: Diagnosis not present

## 2013-05-05 DIAGNOSIS — M109 Gout, unspecified: Secondary | ICD-10-CM | POA: Diagnosis present

## 2013-05-05 DIAGNOSIS — I252 Old myocardial infarction: Secondary | ICD-10-CM | POA: Diagnosis not present

## 2013-05-05 DIAGNOSIS — E119 Type 2 diabetes mellitus without complications: Secondary | ICD-10-CM | POA: Diagnosis not present

## 2013-05-05 HISTORY — DX: Acute kidney failure, unspecified: N17.9

## 2013-05-05 HISTORY — DX: Gout, unspecified: M10.9

## 2013-05-05 LAB — COMPREHENSIVE METABOLIC PANEL
ALK PHOS: 103 U/L (ref 39–117)
ALT: 23 U/L (ref 0–35)
AST: 30 U/L (ref 0–37)
Albumin: 4 g/dL (ref 3.5–5.2)
BUN: 32 mg/dL — AB (ref 6–23)
CALCIUM: 9.3 mg/dL (ref 8.4–10.5)
CO2: 14 meq/L — AB (ref 19–32)
Chloride: 106 mEq/L (ref 96–112)
Creatinine, Ser: 2.19 mg/dL — ABNORMAL HIGH (ref 0.50–1.10)
GFR, EST AFRICAN AMERICAN: 26 mL/min — AB (ref >90)
GFR, EST NON AFRICAN AMERICAN: 22 mL/min — AB (ref >90)
GLUCOSE: 81 mg/dL (ref 70–99)
POTASSIUM: 4.6 meq/L (ref 3.7–5.3)
SODIUM: 138 meq/L (ref 137–147)
Total Bilirubin: 0.3 mg/dL (ref 0.3–1.2)
Total Protein: 8.1 g/dL (ref 6.0–8.3)

## 2013-05-05 LAB — URINALYSIS, ROUTINE W REFLEX MICROSCOPIC
Glucose, UA: NEGATIVE mg/dL
Hgb urine dipstick: NEGATIVE
KETONES UR: 15 mg/dL — AB
NITRITE: NEGATIVE
Protein, ur: 30 mg/dL — AB
Specific Gravity, Urine: 1.027 (ref 1.005–1.030)
UROBILINOGEN UA: 0.2 mg/dL (ref 0.0–1.0)
pH: 5 (ref 5.0–8.0)

## 2013-05-05 LAB — CBC WITH DIFFERENTIAL/PLATELET
Basophils Absolute: 0 10*3/uL (ref 0.0–0.1)
Basophils Relative: 0 % (ref 0–1)
EOS PCT: 2 % (ref 0–5)
Eosinophils Absolute: 0.1 10*3/uL (ref 0.0–0.7)
HCT: 40.1 % (ref 36.0–46.0)
HEMOGLOBIN: 13.6 g/dL (ref 12.0–15.0)
LYMPHS ABS: 1.4 10*3/uL (ref 0.7–4.0)
LYMPHS PCT: 27 % (ref 12–46)
MCH: 30.5 pg (ref 26.0–34.0)
MCHC: 33.9 g/dL (ref 30.0–36.0)
MCV: 89.9 fL (ref 78.0–100.0)
MONOS PCT: 17 % — AB (ref 3–12)
Monocytes Absolute: 0.9 10*3/uL (ref 0.1–1.0)
Neutro Abs: 2.8 10*3/uL (ref 1.7–7.7)
Neutrophils Relative %: 54 % (ref 43–77)
PLATELETS: 213 10*3/uL (ref 150–400)
RBC: 4.46 MIL/uL (ref 3.87–5.11)
RDW: 13.1 % (ref 11.5–15.5)
WBC: 5.2 10*3/uL (ref 4.0–10.5)

## 2013-05-05 LAB — LIPASE, BLOOD: Lipase: 41 U/L (ref 11–59)

## 2013-05-05 LAB — URINE MICROSCOPIC-ADD ON

## 2013-05-05 LAB — CLOSTRIDIUM DIFFICILE BY PCR: Toxigenic C. Difficile by PCR: NEGATIVE

## 2013-05-05 MED ORDER — LACTATED RINGERS IV SOLN
INTRAVENOUS | Status: AC
  Administered 2013-05-05: 17:00:00 via INTRAVENOUS

## 2013-05-05 MED ORDER — COLCHICINE 0.6 MG PO TABS
0.6000 mg | ORAL_TABLET | Freq: Two times a day (BID) | ORAL | Status: DC
  Administered 2013-05-05 – 2013-05-07 (×4): 0.6 mg via ORAL
  Filled 2013-05-05 (×5): qty 1

## 2013-05-05 MED ORDER — LACTATED RINGERS IV SOLN
INTRAVENOUS | Status: DC
  Administered 2013-05-05: 17:00:00 via INTRAVENOUS

## 2013-05-05 MED ORDER — ONDANSETRON HCL 4 MG/2ML IJ SOLN
4.0000 mg | Freq: Once | INTRAMUSCULAR | Status: AC
  Administered 2013-05-05: 4 mg via INTRAVENOUS
  Filled 2013-05-05: qty 2

## 2013-05-05 MED ORDER — GABAPENTIN 400 MG PO CAPS
800.0000 mg | ORAL_CAPSULE | Freq: Every day | ORAL | Status: DC
  Administered 2013-05-05 – 2013-05-06 (×2): 800 mg via ORAL
  Filled 2013-05-05 (×3): qty 2

## 2013-05-05 MED ORDER — CYCLOSPORINE 0.05 % OP EMUL
1.0000 [drp] | Freq: Two times a day (BID) | OPHTHALMIC | Status: DC
  Administered 2013-05-05 – 2013-05-07 (×4): 1 [drp] via OPHTHALMIC
  Filled 2013-05-05 (×5): qty 1

## 2013-05-05 MED ORDER — ASPIRIN EC 81 MG PO TBEC
81.0000 mg | DELAYED_RELEASE_TABLET | Freq: Every day | ORAL | Status: DC
  Administered 2013-05-05 – 2013-05-07 (×3): 81 mg via ORAL
  Filled 2013-05-05 (×3): qty 1

## 2013-05-05 MED ORDER — ALBUTEROL SULFATE HFA 108 (90 BASE) MCG/ACT IN AERS: 2.0000 | INHALATION_SPRAY | Freq: Four times a day (QID) | RESPIRATORY_TRACT | Status: DC | PRN

## 2013-05-05 MED ORDER — HYDROCODONE-ACETAMINOPHEN 5-325 MG PO TABS
1.0000 | ORAL_TABLET | ORAL | Status: DC | PRN
  Administered 2013-05-06: 2 via ORAL
  Administered 2013-05-06: 1 via ORAL
  Administered 2013-05-07: 2 via ORAL
  Filled 2013-05-05 (×2): qty 2
  Filled 2013-05-05: qty 1

## 2013-05-05 MED ORDER — IOHEXOL 300 MG/ML  SOLN
25.0000 mL | Freq: Once | INTRAMUSCULAR | Status: AC | PRN
  Administered 2013-05-05: 25 mL via ORAL

## 2013-05-05 MED ORDER — ONDANSETRON HCL 4 MG/2ML IJ SOLN: 4.0000 mg | Freq: Three times a day (TID) | INTRAMUSCULAR | Status: AC | PRN

## 2013-05-05 MED ORDER — ALBUTEROL SULFATE (2.5 MG/3ML) 0.083% IN NEBU: 2.5000 mg | INHALATION_SOLUTION | Freq: Four times a day (QID) | RESPIRATORY_TRACT | Status: DC | PRN

## 2013-05-05 MED ORDER — GABAPENTIN 800 MG PO TABS
800.0000 mg | ORAL_TABLET | Freq: Every day | ORAL | Status: DC
  Filled 2013-05-05: qty 1

## 2013-05-05 MED ORDER — SODIUM CHLORIDE 0.9 % IV BOLUS (SEPSIS)
1000.0000 mL | Freq: Once | INTRAVENOUS | Status: AC
  Administered 2013-05-05: 1000 mL via INTRAVENOUS

## 2013-05-05 MED ORDER — HEPARIN SODIUM (PORCINE) 5000 UNIT/ML IJ SOLN
5000.0000 [IU] | Freq: Three times a day (TID) | INTRAMUSCULAR | Status: DC
  Administered 2013-05-05 – 2013-05-07 (×6): 5000 [IU] via SUBCUTANEOUS
  Filled 2013-05-05 (×8): qty 1

## 2013-05-05 MED ORDER — POTASSIUM CHLORIDE CRYS ER 20 MEQ PO TBCR
20.0000 meq | EXTENDED_RELEASE_TABLET | Freq: Two times a day (BID) | ORAL | Status: DC
  Administered 2013-05-05 – 2013-05-07 (×4): 20 meq via ORAL
  Filled 2013-05-05 (×5): qty 1

## 2013-05-05 MED ORDER — PEDIALYTE PO SOLN
1000.0000 mL | ORAL | Status: DC
  Administered 2013-05-05: 900 mL via ORAL
  Administered 2013-05-06: 75 mL via ORAL
  Filled 2013-05-05 (×2): qty 1000

## 2013-05-05 NOTE — ED Notes (Signed)
CT informed patient done with contrast.

## 2013-05-05 NOTE — Progress Notes (Signed)
Called to get report from [facility] in ED.

## 2013-05-05 NOTE — H&P (Signed)
[facility] History and Physical  Sandra Brennan RKY:[id number] DOB: [birth year] DOA: 05/05/2013  Referring physician: ED PCP: Leola Brazil, MD  Specialists: NOne  Chief Complaint: Diarrhea  HPI: Sandra Brennan is a 68 y.o. female , known h/o THA [birth year] complicated by Corynebacterium stiatum infection [foll dr snider for IV abx-Vancomycin 4 weeks], h/o gout,  H/o MI ? 1991, history of asthma diagnosed at age 24, diabetes mellitus on metformin, DDD, Knee replacment 2001, MOd sleep apnea NOt using CPAP[facility] with profuse diarrhea starting 05/02/13 States that she ordered [place] take-out from [employer]" on 05/02/48 in-she had a pork fried rice, eventual addition and about 2 hours later experienced first episode of watery yellow diarrhea at around 10:30 PM that progressed to about 10-15 times per day She states that she had some mild abdominal pain and has only been able to drink water. On 05/04/13, she was trying to take this on your but this completely around to her and she has not really eaten anything since then. She tells me that she has a history of chest pain that's been worked up by her primary care physician which was thought to be secondary to PICC line placement for chronic antibiotic use last year. She also states that she's had significant nausea but no episodes of vomiting and took some for acid reflux medications It is noted that she takes antihypertensives with diuretics and takes diclofenac as well for chronic pains as well as metformin.   Review of Systems: The patient denies  Fever, chills, cough, cold, other sick contacts or exotic travel Denies lower extremity swelling we decided one-sided body but has been over a week  Emergency room workup = UA = specific gravity 1.37, pH 5.0, mild ketones 15, protein 30, granular casts and calcium oxalate crystals C. difficile negative stool culture pending CT abdomen pelvis = liquid stool in colon without  thickening pericolonic inflammatory change no evidence diverticulitis or obstruction  BUN 32, creatinine 2.19, CO2 14 WC 5.2  EKG sinus rhythm flipped T wave in V6 QRS axis 30, otherwise normal  Past Medical History  Diagnosis Date  . Hypertension   . Asthma   . Shortness of breath     when stressed.  . Myocardial infarction 1992    08-22-12 "mild MI when son died "  . GERD (gastroesophageal reflux disease)   . Sleep apnea     "never did fix my machine; haven't used one for 5 years; I've lost 116# since then; no problems now" (Aug 22, 2012)  . Type II diabetes mellitus     "borderline; don't test; take Metformin" (08-22-2012)  . Anemia   . Migraines     "used to; totally stopped when I quit drinking 30 yr ago" (2012-08-22)  . Arthritis     "plenty" (2012/08/22)  . Degenerative arthritis     "all over" (08-22-12)  . Osteoporosis     "all over" (08-22-12)  . Chronic lower back pain     "they say I need a whole new spinal column" (08-22-2012)   Past Surgical History  Procedure Laterality Date  . Ganglion cyst excision Right 1980's    "wrist" (08-22-12)  . Knee ligament reconstruction Right 1980's  . Total hip arthroplasty Left August 22, 2012  . Tonsillectomy  ~ 1954  . Total knee arthroplasty Left 2001  . Total knee arthroplasty Right 2004  . Joint replacement    . Abdominal hysterectomy  1990's  . Total hip arthroplasty Left 2012-08-22  Procedure: TOTAL HIP ARTHROPLASTY;  Surgeon: Garald Balding, MD;  Location: [facility];  Service: Orthopedics;  Laterality: Left;  Left Total Hip Arthroplasty  . Total hip arthroplasty Left 08/28/2012    Procedure: Irrigation and Debridement hip ;  Surgeon: Mcarthur Rossetti, MD;  Location: [place];  Service: Orthopedics;  Laterality: Left;   Social History:  History   Social History Narrative  . No narrative on file    Allergies  Allergen Reactions  . Penicillins Hives  . Sulfa Antibiotics Other (See Comments)    REACTION: unknown  .  Theophyllines Hives    Family History  Problem Relation Age of Onset  . Arthritis Mother   . Arthritis Father   . Diabetes Sister   . Arthritis Sister   . Diabetes Maternal Grandmother   . Arthritis Maternal Grandmother   . Diabetes Maternal Grandfather   . Arthritis Maternal Grandfather     Prior to Admission medications   Medication Sig Start Date End Date Taking? Authorizing Provider  albuterol (PROVENTIL HFA;VENTOLIN HFA) 108 (90 BASE) MCG/ACT inhaler Inhale 2 puffs into the lungs every 6 (six) hours as needed for wheezing.   Yes Historical Provider, MD  aspirin EC 81 MG tablet Take 81 mg by mouth daily.   Yes Historical Provider, MD  colchicine 0.6 MG tablet Take 0.6 mg by mouth 2 (two) times daily.   Yes Historical Provider, MD  cycloSPORINE (RESTASIS) 0.05 % ophthalmic emulsion Place 1 drop into both eyes 2 (two) times daily.   Yes Historical Provider, MD  Diclofenac-Misoprostol 75-0.2 MG TBEC Take 1 tablet by mouth 2 (two) times daily.   Yes Historical Provider, MD  gabapentin (NEURONTIN) 800 MG tablet Take 800 mg by mouth daily.    Yes Historical Provider, MD  HYDROcodone-acetaminophen (NORCO) 5-325 MG per tablet Take 1-2 tablets by mouth every 4 (four) hours as needed for pain. 08/31/12  Yes Mcarthur Rossetti, MD  metFORMIN (GLUMETZA) 500 MG (MOD) 24 hr tablet Take 500 mg by mouth daily with breakfast.   Yes Historical Provider, MD  potassium chloride SA (K-DUR,KLOR-CON) 20 MEQ tablet Take 20 mEq by mouth 2 (two) times daily.   Yes Historical Provider, MD  triamterene-hydrochlorothiazide (MAXZIDE-25) 37.5-25 MG per tablet Take 1 tablet by mouth daily.   Yes Historical Provider, MD   Physical Exam: Filed Vitals:   05/05/13 1601 05/05/13 1627 05/05/13 1627 05/05/13 1629  BP: 114/57 106/68 91/61 100/62  Pulse: 78 69 66 87  Temp:      TempSrc:      Resp:      Weight:      SpO2: 100%        General:  Alert pleasant oriented no apparent distress  Eyes: EOMI, mild  pallor  ENT: Dry mucosa moderate dentition  Neck: Soft supple, neck veins flat no carotid bruit  Cardiovascular: S1-S2 no murmur rub or gallop  Respiratory: Clinically clear  Abdomen: Soft but slightly tender in lower quadrants diffusely no rebound  Skin: No lower extremity edema  Musculoskeletal: Range of motion intact  Psychiatric: Euthymic  Neurologic: Power grossly intact as much "moving all 4 limbs equally  Labs on Admission:  Basic Metabolic Panel:  Recent Labs Lab 05/05/13 1207  NA 138  K 4.6  CL 106  CO2 14*  GLUCOSE 81  BUN 32*  CREATININE 2.19*  CALCIUM 9.3   Liver Function Tests:  Recent Labs Lab 05/05/13 1207  AST 30  ALT 23  ALKPHOS 103  BILITOT  0.3  PROT 8.1  ALBUMIN 4.0    Recent Labs Lab 05/05/13 1207  LIPASE 41   No results found for this basename: AMMONIA,  in the last 168 hours CBC:  Recent Labs Lab 05/05/13 1207  WBC 5.2  NEUTROABS 2.8  HGB 13.6  HCT 40.1  MCV 89.9  PLT 213   Cardiac Enzymes: No results found for this basename: CKTOTAL, CKMB, CKMBINDEX, TROPONINI,  in the last 168 hours  BNP (last 3 results) No results found for this basename: PROBNP,  in the last 8760 hours CBG: No results found for this basename: GLUCAP,  in the last 168 hours  Radiological Exams on Admission: Ct Abdomen Pelvis Wo Contrast  05/05/2013   CLINICAL DATA:  Generalized weakness with back and abdominal pain and diarrhea.  EXAM: CT ABDOMEN AND PELVIS WITHOUT CONTRAST  TECHNIQUE: Multidetector CT imaging of the abdomen and pelvis was performed following the standard protocol without intravenous contrast.  COMPARISON:  CT scan of the abdomen and pelvis dated March 24, 2009  FINDINGS: The liver exhibits no focal mass nor ductal dilation. The gallbladder is adequately distended with no evidence of calcified stones. The pancreas, spleen, nondistended stomach, adrenal glands, and kidneys exhibit no acute abnormalities. There is a stable appearing  cystic structure in the lower pole of the left kidney. There is a stable cystic structure in the mid to lower pole of the right kidney as well. No calcified stones are evident. The caliber of the abdominal aorta is normal. These psoas musculature exhibits no abnormal densities. There is no periaortic or pericaval lymphadenopathy.  The unopacified loops of small and large bowel exhibit no evidence of ileus nor obstruction. Liquid stool is demonstrated in the transverse colon. There is no evidence of acute diverticulitis or acute appendicitis. Beam hardening artifact from the prosthetic left hip obscures much of the lower pelvic structures. There is no inguinal nor umbilical hernia.  The lumbar vertebral bodies are preserved in height. There is degenerative disc space narrowing in the lower thoracic and upper lumbar spine. There is no spondylolisthesis. The bony pelvis exhibits no acute abnormalities. There is a prosthetic left hip joint.  The lung bases are clear.  IMPRESSION: 1. There is a diarrheal process present with a moderate amount of liquid stool present within the colon. There is no evidence of bowel wall thickening or pericolonic inflammatory change. There is no evidence of diverticulitis or acute appendicitis. There is no small or large bowel obstruction. 2. There is no acute hepatobiliary or urinary tract abnormality. 3. There is no evidence of intra-abdominal lymphadenopathy or of ascites. 4. There are degenerative changes of the lower thoracic and upper lumbar discs.   Electronically Signed   By: David  Martinique   On: 05/05/2013 15:57    EKG: Independently reviewed. See above  Assessment/Plan Principal Problem:   Viral gastroenteritis-either staphylococcal or bacillus gastroenteritis vs. Viral-supportive care, ORS with Pedialyte, lactated ringer for 12 hours and reassess.  Continue clear liquids only for now.  Will consider low dose constipating agent such as Lomotil if stool remains  nonbloody Active Problems:   Acute kidney injury-secondary to NSAID use, volume depletion, thiazide diuretic-hydrate gently. History suggests chronic GI losses causing mild metabolic acidosis.  Should resolve with correction of diarrhea   Osteoarthritis of left hip-stable continue Percocet only no diclofenac   Diabetes mellitus type 2 in obese with peripheral neuropathy-hold metformin. Monitor blood sugars a.m. holding. Sounds like she is well controlled as an outpatient.  Continue gabapentin  800 daily   Hypertension-might need hydralazine which has been ordered as patient usually takes Maxzide. Monitor in a.m.   Asthma, chronic-sounds like she has mild intermittent-continue albuterol inhaler when necessary   Sleep apnea-will discuss with the patient complains as an outpatient   Gout-continue colchicine 0.6 twice a day when necessary   Volume depletion, gastrointestinal loss-see above discussion   Undifferentiated chest pain-EKG confirms no acute changes sinus actually normal sinus rhythm-no further workup at present time  Confirm DO NOT RESUSCITATE at bedside Observation status No family present  Time spent: 77  Verlon Au [facility] Pager [phone]  If 7PM-7AM, please contact night-coverage [email or web address] Password [email or web address] 05/05/2013, 5:14 PM

## 2013-05-05 NOTE — ED Notes (Signed)
CT stated pt is next on the list

## 2013-05-05 NOTE — Progress Notes (Signed)
Sandra Brennan [id number] Admission Data: 05/05/2013 6:52 PM Attending Provider: Nita Sells, MD  FTD:[id number] [name], MD Consults/ Treatment Team:    Sandra Brennan is a 68 y.o. female patient admitted from ED awake, alert  & orientated  X 3,  DNR, VSS - Blood pressure 112/63, pulse 69, temperature 98.4 F (36.9 C), temperature source Oral, resp. rate 16, weight 99.791 kg (220 lb), SpO2 100.00%., no c/o shortness of breath, no c/o chest pain, no distress noted. Tele # 1 placed and pt is currently running:normal sinus rhythm.   IV site WDL:  antecubital left, condition patent and no redness with a transparent dsg that's clean dry and intact.  Allergies:   Allergies  Allergen Reactions  . Penicillins Hives  . Sulfa Antibiotics Other (See Comments)    REACTION: unknown  . Theophyllines Hives     Past Medical History  Diagnosis Date  . Hypertension   . Asthma   . Shortness of breath     when stressed.  . Myocardial infarction 1992    08-30-2012 "mild MI when son died "  . GERD (gastroesophageal reflux disease)   . Sleep apnea     "never did fix my machine; haven't used one for 5 years; I've lost 116# since then; no problems now" (August 30, 2012)  . Type II diabetes mellitus     "borderline; don't test; take Metformin" (2012/08/30)  . Anemia   . Migraines     "used to; totally stopped when I quit drinking 30 yr ago" (August 30, 2012)  . Arthritis     "plenty" (30-Aug-2012)  . Degenerative arthritis     "all over" (08/30/12)  . Osteoporosis     "all over" (2012/08/30)  . Chronic lower back pain     "they say I need a whole new spinal column" (08-30-12)    History:  obtained from the patient. Tobacco/alcohol: denied none  Pt orientation to unit, room and routine. Information packet given to patient/family and safety video watched.  Admission INP armband ID verified with patient/family, and in place. SR up x 2, fall risk assessment complete with Patient and family verbalizing  understanding of risks associated with falls. Pt verbalizes an understanding of how to use the call bell and to call for help before getting out of bed.  Patient has burn on RPFA.    Will cont to monitor and assist as needed.  Arrietty Dercole Margaretha Sheffield, RN 05/05/2013 6:52 PM

## 2013-05-05 NOTE — ED Notes (Signed)
Pt states she is feeling better, but still feels like at any moment she is going to run to the bathroom with diarrhea.

## 2013-05-05 NOTE — ED Notes (Signed)
Report called to Ginger, RN

## 2013-05-05 NOTE — ED Notes (Signed)
Pt complaining of diarrhea since Monday. Pt states she has had loose watery stools and nausea since she ate a vegetable medley from a [employer]. Pt exhibiting weakness. Pt denies emesis. Pt states that her pain is in her abdomen, side, and back.

## 2013-05-05 NOTE — ED Provider Notes (Addendum)
CSN: [id number]     Arrival date & time 05/05/13  1021 History   First MD Initiated Contact with Patient 05/05/13 1149     Chief Complaint  Patient presents with  . Diarrhea     (Consider location/radiation/quality/duration/timing/severity/associated sxs/prior Treatment) HPI Comments: Patient presents to the ER for evaluation of watery diarrhea for 4 days. Patient reports nausea without vomiting. She has not had a fever or upper respiratory symptoms. Patient reports diffuse abdominal cramping which waxes and wanes, at times severe. She is feeling very weak. She has not had any passing out. She denies any sick contacts. Was on prolonged antibiotics 8 months ago for an infected knee prosthesis, but none since.  Patient is a 68 y.o. female presenting with diarrhea.  Diarrhea Associated symptoms: abdominal pain and vomiting   Associated symptoms: no fever     Past Medical History  Diagnosis Date  . Hypertension   . Asthma   . Shortness of breath     when stressed.  . Myocardial infarction 1992    2012-08-08 "mild MI when son died "  . GERD (gastroesophageal reflux disease)   . Sleep apnea     "never did fix my machine; haven't used one for 5 years; I've lost 116# since then; no problems now" (2012/08/08)  . Type II diabetes mellitus     "borderline; don't test; take Metformin" (08-08-2012)  . Anemia   . Migraines     "used to; totally stopped when I quit drinking 30 yr ago" (08-08-12)  . Arthritis     "plenty" (2012-08-08)  . Degenerative arthritis     "all over" (August 08, 2012)  . Osteoporosis     "all over" (August 08, 2012)  . Chronic lower back pain     "they say I need a whole new spinal column" (08/08/12)   Past Surgical History  Procedure Laterality Date  . Ganglion cyst excision Right 1980's    "wrist" (08/08/2012)  . Knee ligament reconstruction Right 1980's  . Total hip arthroplasty Left 08-08-2012  . Tonsillectomy  ~ 1954  . Total knee arthroplasty Left 2001  . Total knee  arthroplasty Right 2004  . Joint replacement    . Abdominal hysterectomy  1990's  . Total hip arthroplasty Left 2012-08-08    Procedure: TOTAL HIP ARTHROPLASTY;  Surgeon: Garald Balding, MD;  Location: [place];  Service: Orthopedics;  Laterality: Left;  Left Total Hip Arthroplasty  . Total hip arthroplasty Left 08/28/2012    Procedure: Irrigation and Debridement hip ;  Surgeon: Mcarthur Rossetti, MD;  Location: [place];  Service: Orthopedics;  Laterality: Left;   Family History  Problem Relation Age of Onset  . Arthritis Mother   . Arthritis Father   . Diabetes Sister   . Arthritis Sister   . Diabetes Maternal Grandmother   . Arthritis Maternal Grandmother   . Diabetes Maternal Grandfather   . Arthritis Maternal Grandfather    History  Substance Use Topics  . Smoking status: Former Smoker -- 0.12 packs/day for 2 years    Types: Cigarettes  . Smokeless tobacco: Never Used     Comment: 08/08/2012 "quit 30 years ago"  . Alcohol Use: No     Comment: 08-08-2012 "used to be a beeralcoholic; stopped ~ 30 yr ago"   OB History   Grav Para Term Preterm Abortions TAB SAB Ect Mult Living                 Review of Systems  Constitutional: Negative for  fever.  Gastrointestinal: Positive for nausea, vomiting, abdominal pain and diarrhea. Negative for blood in stool and anal bleeding.  Neurological: Positive for weakness (generalized).  All other systems reviewed and are negative.      Allergies  Penicillins; Sulfa antibiotics; and Theophyllines  Home Medications   Current Outpatient Rx  Name  Route  Sig  Dispense  Refill  . albuterol (PROVENTIL HFA;VENTOLIN HFA) 108 (90 BASE) MCG/ACT inhaler   Inhalation   Inhale 2 puffs into the lungs every 6 (six) hours as needed for wheezing.         Marland Kitchen aspirin EC 81 MG tablet   Oral   Take 81 mg by mouth daily.         . colchicine 0.6 MG tablet   Oral   Take 0.6 mg by mouth 2 (two) times daily.         . cycloSPORINE (RESTASIS)  0.05 % ophthalmic emulsion   Both Eyes   Place 1 drop into both eyes 2 (two) times daily.         . Diclofenac-Misoprostol 75-0.2 MG TBEC   Oral   Take 1 tablet by mouth 2 (two) times daily.         Marland Kitchen gabapentin (NEURONTIN) 800 MG tablet   Oral   Take 800 mg by mouth daily.          Marland Kitchen HYDROcodone-acetaminophen (NORCO) 5-325 MG per tablet   Oral   Take 1-2 tablets by mouth every 4 (four) hours as needed for pain.   60 tablet   0   . metFORMIN (GLUMETZA) 500 MG (MOD) 24 hr tablet   Oral   Take 500 mg by mouth daily with breakfast.         . potassium chloride SA (K-DUR,KLOR-CON) 20 MEQ tablet   Oral   Take 20 mEq by mouth 2 (two) times daily.         Marland Kitchen triamterene-hydrochlorothiazide (MAXZIDE-25) 37.5-25 MG per tablet   Oral   Take 1 tablet by mouth daily.          BP 128/79  Pulse 72  Temp(Src) 98.4 F (36.9 C) (Oral)  Resp 16  Wt 220 lb (99.791 kg)  SpO2 100% Physical Exam  Constitutional: She is oriented to person, place, and time. She appears well-developed and well-nourished. No distress.  HENT:  Head: Normocephalic and atraumatic.  Right Ear: Hearing normal.  Left Ear: Hearing normal.  Nose: Nose normal.  Mouth/Throat: Oropharynx is clear and moist and mucous membranes are normal.  Eyes: Conjunctivae and EOM are normal. Pupils are equal, round, and reactive to light.  Neck: Normal range of motion. Neck supple.  Cardiovascular: Regular rhythm, S1 normal and S2 normal.  Exam reveals no gallop and no friction rub.   No murmur heard. Pulmonary/Chest: Effort normal and breath sounds normal. No respiratory distress. She exhibits no tenderness.  Abdominal: Soft. Normal appearance. Bowel sounds are increased. There is no hepatosplenomegaly. There is generalized tenderness. There is no rebound, no guarding, no tenderness at McBurney's point and negative Murphy's sign. No hernia.  Musculoskeletal: Normal range of motion.  Neurological: She is alert and  oriented to person, place, and time. She has normal strength. No cranial nerve deficit or sensory deficit. Coordination normal. GCS eye subscore is 4. GCS verbal subscore is 5. GCS motor subscore is 6.  Skin: Skin is warm, dry and intact. No rash noted. No cyanosis.  Psychiatric: She has a normal mood and affect. Her speech  is normal and behavior is normal. Thought content normal.    ED Course  Procedures (including critical care time) Labs Review Labs Reviewed  CBC WITH DIFFERENTIAL - Abnormal; Notable for the following:    Monocytes Relative 17 (*)    All other components within normal limits  COMPREHENSIVE METABOLIC PANEL - Abnormal; Notable for the following:    CO2 14 (*)    BUN 32 (*)    Creatinine, Ser 2.19 (*)    GFR calc non Af Amer 22 (*)    GFR calc Af Amer 26 (*)    All other components within normal limits  URINALYSIS, ROUTINE W REFLEX MICROSCOPIC - Abnormal; Notable for the following:    Color, Urine AMBER (*)    APPearance CLOUDY (*)    Bilirubin Urine MODERATE (*)    Ketones, ur 15 (*)    Protein, ur 30 (*)    Leukocytes, UA TRACE (*)    All other components within normal limits  URINE MICROSCOPIC-ADD ON - Abnormal; Notable for the following:    Squamous Epithelial / LPF MANY (*)    Bacteria, UA MANY (*)    Casts GRANULAR CAST (*)    Crystals CA OXALATE CRYSTALS (*)    All other components within normal limits  CLOSTRIDIUM DIFFICILE BY PCR  STOOL CULTURE  LIPASE, BLOOD   Imaging Review Ct Abdomen Pelvis Wo Contrast  05/05/2013   CLINICAL DATA:  Generalized weakness with back and abdominal pain and diarrhea.  EXAM: CT ABDOMEN AND PELVIS WITHOUT CONTRAST  TECHNIQUE: Multidetector CT imaging of the abdomen and pelvis was performed following the standard protocol without intravenous contrast.  COMPARISON:  CT scan of the abdomen and pelvis dated March 24, 2009  FINDINGS: The liver exhibits no focal mass nor ductal dilation. The gallbladder is adequately distended  with no evidence of calcified stones. The pancreas, spleen, nondistended stomach, adrenal glands, and kidneys exhibit no acute abnormalities. There is a stable appearing cystic structure in the lower pole of the left kidney. There is a stable cystic structure in the mid to lower pole of the right kidney as well. No calcified stones are evident. The caliber of the abdominal aorta is normal. These psoas musculature exhibits no abnormal densities. There is no periaortic or pericaval lymphadenopathy.  The unopacified loops of small and large bowel exhibit no evidence of ileus nor obstruction. Liquid stool is demonstrated in the transverse colon. There is no evidence of acute diverticulitis or acute appendicitis. Beam hardening artifact from the prosthetic left hip obscures much of the lower pelvic structures. There is no inguinal nor umbilical hernia.  The lumbar vertebral bodies are preserved in height. There is degenerative disc space narrowing in the lower thoracic and upper lumbar spine. There is no spondylolisthesis. The bony pelvis exhibits no acute abnormalities. There is a prosthetic left hip joint.  The lung bases are clear.  IMPRESSION: 1. There is a diarrheal process present with a moderate amount of liquid stool present within the colon. There is no evidence of bowel wall thickening or pericolonic inflammatory change. There is no evidence of diverticulitis or acute appendicitis. There is no small or large bowel obstruction. 2. There is no acute hepatobiliary or urinary tract abnormality. 3. There is no evidence of intra-abdominal lymphadenopathy or of ascites. 4. There are degenerative changes of the lower thoracic and upper lumbar discs.   Electronically Signed   By: David  Martinique   On: 05/05/2013 15:57     EKG Interpretation None  MDM   Final diagnoses:  AKI (acute kidney injury)  Dehydration  Diarrhea   Patient presents to the ER for evaluation of severe diarrhea for 4 days. Diarrhea  has been watery, nonbloody. She has been experiencing generalized weakness and dizziness, as well as lower abdominal discomfort. Her examination, however, was benign.  CBC was unremarkable. Comprehensive metabolic panel reveals profound dehydration. Patient has a CO2 of 14, BUN 32, creatinine 2.19. She has no history of renal insufficiency, baseline creatinine was less than 1.  Patient has been orthostatic here in the ER, including after a liter of fluids. Based on the fact she is profoundly dehydrated, has acute kidney injury and is mildly hypotensive, will require hospitalization for further management.  Orpah Greek, MD 05/05/13 [place], MD 05/05/13 [phone]

## 2013-05-05 NOTE — ED Notes (Signed)
Per pt sts diarrhea since Monday night. sts that she cannot sleep and it is very watery. sts pain all over and weak all over.

## 2013-05-06 DIAGNOSIS — I10 Essential (primary) hypertension: Secondary | ICD-10-CM

## 2013-05-06 DIAGNOSIS — D62 Acute posthemorrhagic anemia: Secondary | ICD-10-CM

## 2013-05-06 DIAGNOSIS — E860 Dehydration: Secondary | ICD-10-CM | POA: Diagnosis not present

## 2013-05-06 DIAGNOSIS — N179 Acute kidney failure, unspecified: Secondary | ICD-10-CM | POA: Diagnosis not present

## 2013-05-06 DIAGNOSIS — R197 Diarrhea, unspecified: Secondary | ICD-10-CM | POA: Diagnosis not present

## 2013-05-06 DIAGNOSIS — E669 Obesity, unspecified: Secondary | ICD-10-CM | POA: Diagnosis present

## 2013-05-06 DIAGNOSIS — M109 Gout, unspecified: Secondary | ICD-10-CM

## 2013-05-06 LAB — CBC
HEMATOCRIT: 35.3 % — AB (ref 36.0–46.0)
Hemoglobin: 11.9 g/dL — ABNORMAL LOW (ref 12.0–15.0)
MCH: 30.4 pg (ref 26.0–34.0)
MCHC: 33.7 g/dL (ref 30.0–36.0)
MCV: 90.3 fL (ref 78.0–100.0)
PLATELETS: 169 10*3/uL (ref 150–400)
RBC: 3.91 MIL/uL (ref 3.87–5.11)
RDW: 13.2 % (ref 11.5–15.5)
WBC: 3 10*3/uL — ABNORMAL LOW (ref 4.0–10.5)

## 2013-05-06 LAB — COMPREHENSIVE METABOLIC PANEL
ALBUMIN: 3.3 g/dL — AB (ref 3.5–5.2)
ALK PHOS: 80 U/L (ref 39–117)
ALT: 19 U/L (ref 0–35)
AST: 25 U/L (ref 0–37)
BILIRUBIN TOTAL: 0.3 mg/dL (ref 0.3–1.2)
BUN: 26 mg/dL — ABNORMAL HIGH (ref 6–23)
CHLORIDE: 104 meq/L (ref 96–112)
CO2: 15 mEq/L — ABNORMAL LOW (ref 19–32)
Calcium: 8.8 mg/dL (ref 8.4–10.5)
Creatinine, Ser: 1.5 mg/dL — ABNORMAL HIGH (ref 0.50–1.10)
GFR calc Af Amer: 41 mL/min — ABNORMAL LOW (ref >90)
GFR calc non Af Amer: 35 mL/min — ABNORMAL LOW (ref >90)
Glucose, Bld: 67 mg/dL — ABNORMAL LOW (ref 70–99)
POTASSIUM: 3.9 meq/L (ref 3.7–5.3)
SODIUM: 135 meq/L — AB (ref 137–147)
TOTAL PROTEIN: 6.7 g/dL (ref 6.0–8.3)

## 2013-05-06 LAB — GLUCOSE, CAPILLARY
Glucose-Capillary: 66 mg/dL — ABNORMAL LOW (ref 70–99)
Glucose-Capillary: 81 mg/dL (ref 70–99)

## 2013-05-06 MED ORDER — SODIUM CHLORIDE 0.9 % IV SOLN
INTRAVENOUS | Status: AC
  Administered 2013-05-06: 75 mL/h via INTRAVENOUS

## 2013-05-06 MED ORDER — LOPERAMIDE HCL 2 MG PO CAPS
2.0000 mg | ORAL_CAPSULE | ORAL | Status: DC | PRN
  Administered 2013-05-06: 2 mg via ORAL
  Filled 2013-05-06 (×2): qty 1

## 2013-05-06 MED ORDER — CALCIUM CARBONATE ANTACID 500 MG PO CHEW
400.0000 mg | CHEWABLE_TABLET | Freq: Two times a day (BID) | ORAL | Status: DC
  Administered 2013-05-06 – 2013-05-07 (×2): 400 mg via ORAL
  Filled 2013-05-06 (×3): qty 2

## 2013-05-06 MED ORDER — PANTOPRAZOLE SODIUM 40 MG PO TBEC
40.0000 mg | DELAYED_RELEASE_TABLET | Freq: Every day | ORAL | Status: DC
  Administered 2013-05-06 – 2013-05-07 (×2): 40 mg via ORAL
  Filled 2013-05-06 (×2): qty 1

## 2013-05-06 NOTE — Progress Notes (Signed)
Hypoglycemic Event  CBG: 66  Treatment: 15 GM carbohydrate snack, oj  Symptoms: None  Follow-up CBG: Time:0850 CBG Result:81 , pt. Eating breakfast.   Possible Reasons for Event: Unknown  Comments/MD notified:MD notified.    Sandra Brennan L  Remember to initiate Hypoglycemia Order Set & complete

## 2013-05-06 NOTE — Progress Notes (Signed)
Notified Rogue Bussing, NP to clarify Pedialyte order of @75cc /hr continuous. Rogue Bussing, NP stated just give pt the Pedialyte orally tonight to total 67ml per hr. Will continue to monitor pt. Ranelle Oyster, RN

## 2013-05-06 NOTE — Progress Notes (Signed)
Addendum  Patient seen and examined, chart and data base reviewed.  I agree with the above assessment and plan.  For full details please see Mrs. Imogene Burn PA note.  Gastroenteritis and acute renal failure secondary to dehydration. Patient started on IV fluids.   Birdie Hopes, MD [facility] Pager: [phone] 05/06/2013, 4:32 PM

## 2013-05-06 NOTE — Progress Notes (Signed)
PROGRESS NOTE  Sandra Brennan [id number] DOB: [birth year] DOA: 05/05/2013 PCP: Leola Brazil, MD  HPI/Subjective: Sandra Brennan is a 68 yo African American female with a PMH of Gout, MI, DM, asthma, OSA, degenerative disc disease and a THA ([id number]) complicated by corynebacterium stiatum infection (IV vancomycin x4 weeks). Patient presented to the ED on 3/5 with 3 days of watery yellow diarrhea and mild abdominal pain. She states this started on 3/2, two hours after eating chinese takeout from "[employer]". She has had nausea but no episodes of vomiting. Upon arrival to the ED she noted she had only been able to tolerate water since this started. She denies fever, chills, cough, chest pain, and SOB.   Today the patient states she feels better than when she came in. The diarrhea has slowed down and she feels less nauseated. She is still complaining of some mild abdominal cramping. This morning she tolerated clears and some jello. Plan is to advance diet as tolerated and ensure adequate hydration.    Assessment/Plan: Principal Problem:   Gastroenteritis -Either staphylococcal or bacillus gastroenteritis vs. Viral -Stool cultures pending, C-diff negative. -Supportive care, IV fluids, Consider low dose constipating agent such as Lomotil if stool remains nonbloody   -Advance diet as tolerated  Acute Kidney Injury -Secondary to NSAID use, volume depletion and thiazide diuretic -Hold triamterene-HCTZ and Diclofenac-Misoprostol -Hydrate gently   Metabolic Acidosis -Due to GI losses -Should resolve with correction of diarrhea and hydration -Monitor  Osteoarthritis -Left hip OA -Continue Percocet; Hold diclofenac.   Diabetes mellitus type 2 with Peripheral Neuropathy -Controlled -Hold metformin.  -Monitor blood sugars a.m. -Continue gabapentin 800 daily   Hypertension -Stable -Hold diuretic, consider hydralazine if needed  Asthma -Chronic -Continue albuterol inhaler  prn  Gout -Continue colchicine 0.6 twice a day when necessary    DVT Prophylaxis: Heparin  Code Status: DNR Family Communication: Plan discussed with patient. No family present at this time. Disposition Plan: Remain inpatient   Consultants:  None  Procedures:  Abdominal CT  Antibiotics:  None  Objective: Filed Vitals:   05/06/13 0950 05/06/13 1131 05/06/13 1134 05/06/13 1135  BP: 106/75 129/79 121/81 149/84  Pulse: 74 70 80 87  Temp: 98.4 F (36.9 C)     TempSrc: Oral     Resp: 16 18    Height:      Weight:      SpO2: 100% 100% 100%     Intake/Output Summary (Last 24 hours) at 05/06/13 1211 Last data filed at 05/05/13 2126  Gross per 24 hour  Intake    900 ml  Output      0 ml  Net    900 ml   Filed Weights   05/05/13 1108 05/05/13 1857  Weight: 99.791 kg (220 lb) 102.785 kg (226 lb 9.6 oz)    Exam: General: Well developed, well nourished, NAD, appears stated age and fatigued. HEENT:  PERR, EOMI, MMM Neck: Supple, no JVD, no masses  Cardiovascular: RRR, S1 S2 auscultated, no rubs, murmurs or gallops.   Respiratory: Clear to auscultation bilaterally with equal chest rise  Abdomen: obese, Soft,mildly tender, nondistended, + hyperactive bowel sounds  Extremities: warm dry without cyanosis clubbing or edema.  Neuro: AAOx3, cranial nerves grossly intact. Strength 5/5 in upper and lower extremities  Skin: Without rashes exudates or nodules.   Psych: Normal affect and demeanor with intact judgement and insight   Data Reviewed: Basic Metabolic Panel:  Recent Labs Lab 05/05/13 1207 05/06/13 0540  NA  138 135*  K 4.6 3.9  CL 106 104  CO2 14* 15*  GLUCOSE 81 67*  BUN 32* 26*  CREATININE 2.19* 1.50*  CALCIUM 9.3 8.8   Liver Function Tests:  Recent Labs Lab 05/05/13 1207 05/06/13 0540  AST 30 25  ALT 23 19  ALKPHOS 103 80  BILITOT 0.3 0.3  PROT 8.1 6.7  ALBUMIN 4.0 3.3*    Recent Labs Lab 05/05/13 1207  LIPASE 41   CBC:  Recent  Labs Lab 05/05/13 1207 05/06/13 0540  WBC 5.2 3.0*  NEUTROABS 2.8  --   HGB 13.6 11.9*  HCT 40.1 35.3*  MCV 89.9 90.3  PLT 213 169   CBG:  Recent Labs Lab 05/06/13 0801 05/06/13 0849  GLUCAP 66* 81    Recent Results (from the past 240 hour(s))  CLOSTRIDIUM DIFFICILE BY PCR     Status: None   Collection Time    05/05/13  1:22 PM      Result Value Ref Range Status   C difficile by pcr NEGATIVE  NEGATIVE Final  STOOL CULTURE     Status: None   Collection Time    05/05/13  1:22 PM      Result Value Ref Range Status   Specimen Description STOOL   Final   Special Requests NONE   Final   Culture     Final   Value: Culture reincubated for better growth     Performed at [facility]   Report Status PENDING   Incomplete     Studies: Ct Abdomen Pelvis Wo Contrast  05/05/2013   CLINICAL DATA:  Generalized weakness with back and abdominal pain and diarrhea.  EXAM: CT ABDOMEN AND PELVIS WITHOUT CONTRAST  TECHNIQUE: Multidetector CT imaging of the abdomen and pelvis was performed following the standard protocol without intravenous contrast.  COMPARISON:  CT scan of the abdomen and pelvis dated March 24, 2009  FINDINGS: The liver exhibits no focal mass nor ductal dilation. The gallbladder is adequately distended with no evidence of calcified stones. The pancreas, spleen, nondistended stomach, adrenal glands, and kidneys exhibit no acute abnormalities. There is a stable appearing cystic structure in the lower pole of the left kidney. There is a stable cystic structure in the mid to lower pole of the right kidney as well. No calcified stones are evident. The caliber of the abdominal aorta is normal. These psoas musculature exhibits no abnormal densities. There is no periaortic or pericaval lymphadenopathy.  The unopacified loops of small and large bowel exhibit no evidence of ileus nor obstruction. Liquid stool is demonstrated in the transverse colon. There is no evidence of acute  diverticulitis or acute appendicitis. Beam hardening artifact from the prosthetic left hip obscures much of the lower pelvic structures. There is no inguinal nor umbilical hernia.  The lumbar vertebral bodies are preserved in height. There is degenerative disc space narrowing in the lower thoracic and upper lumbar spine. There is no spondylolisthesis. The bony pelvis exhibits no acute abnormalities. There is a prosthetic left hip joint.  The lung bases are clear.  IMPRESSION: 1. There is a diarrheal process present with a moderate amount of liquid stool present within the colon. There is no evidence of bowel wall thickening or pericolonic inflammatory change. There is no evidence of diverticulitis or acute appendicitis. There is no small or large bowel obstruction. 2. There is no acute hepatobiliary or urinary tract abnormality. 3. There is no evidence of intra-abdominal lymphadenopathy or of ascites. 4. There  are degenerative changes of the lower thoracic and upper lumbar discs.   Electronically Signed   By: David  Martinique   On: 05/05/2013 15:57    Scheduled Meds: . aspirin EC  81 mg Oral Daily  . colchicine  0.6 mg Oral BID  . cycloSPORINE  1 drop Both Eyes BID  . gabapentin  800 mg Oral Daily  . heparin  5,000 Units Subcutaneous 3 times per day  . potassium chloride SA  20 mEq Oral BID   Continuous Infusions:   Principal Problem:   Viral gastroenteritis Active Problems:   Osteoarthritis of left hip   Diabetes mellitus type 2 in obese   Hypertension   Asthma, chronic   Sleep apnea   Gout   Volume depletion, gastrointestinal loss   Acute nontraumatic kidney injury   Obesity    Ruben Im PA-S Imogene Burn, PA-C  [facility] Pager [phone]. If 7PM-7AM, please contact night-coverage at [email or web address], password [facility] 05/06/2013, 12:11 PM  LOS: 1 day

## 2013-05-06 NOTE — Progress Notes (Signed)
Nutrition Brief Note  Patient identified on the Malnutrition Screening Tool (MST) Report. Discussed pt during progression rounds. Pt is tolerating Clear Liquid diet well, consumed 100% of her breakfast this morning. Planning to advance diet as tolerated, and is now ordered for Full Liquids. Discussed current order of Pedialyte at 75 ml/hr with PA, [facility]. RD discontinued this order based on discussion during progression rounds. No further nutrition needs identified.  Wt Readings from Last 15 Encounters:  05/05/13 226 lb 9.6 oz (102.785 kg)  09/20/12 220 lb (99.791 kg)  08/28/12 227 lb 15.3 oz (103.4 kg)  08/28/12 227 lb 15.3 oz (103.4 kg)  09/05/12 225 lb 6.4 oz (102.241 kg)  08/03/12 227 lb 15.3 oz (103.4 kg)  08/03/12 227 lb 15.3 oz (103.4 kg)  07/28/12 227 lb 15.3 oz (103.4 kg)  11/25/11 264 lb (119.75 kg)    Body mass index is 42.84 kg/(m^2). Patient meets criteria for Obese Class III based on current BMI.   Current diet order is Clear Liquids, patient is consuming approximately 100% of meals at this time. Labs and medications reviewed.   No nutrition interventions warranted at this time. If nutrition issues arise, please consult RD.   Inda Coke MS, RD, LDN Inpatient Registered Dietitian Pager: [phone] After-hours pager: [phone]

## 2013-05-07 DIAGNOSIS — E669 Obesity, unspecified: Secondary | ICD-10-CM

## 2013-05-07 DIAGNOSIS — E119 Type 2 diabetes mellitus without complications: Secondary | ICD-10-CM | POA: Diagnosis not present

## 2013-05-07 DIAGNOSIS — R197 Diarrhea, unspecified: Secondary | ICD-10-CM | POA: Diagnosis not present

## 2013-05-07 DIAGNOSIS — E860 Dehydration: Secondary | ICD-10-CM | POA: Diagnosis not present

## 2013-05-07 DIAGNOSIS — N179 Acute kidney failure, unspecified: Secondary | ICD-10-CM | POA: Diagnosis not present

## 2013-05-07 LAB — BASIC METABOLIC PANEL
BUN: 17 mg/dL (ref 6–23)
CO2: 20 meq/L (ref 19–32)
CREATININE: 1.34 mg/dL — AB (ref 0.50–1.10)
Calcium: 8.6 mg/dL (ref 8.4–10.5)
Chloride: 108 mEq/L (ref 96–112)
GFR calc Af Amer: 47 mL/min — ABNORMAL LOW (ref >90)
GFR calc non Af Amer: 41 mL/min — ABNORMAL LOW (ref >90)
Glucose, Bld: 75 mg/dL (ref 70–99)
Potassium: 4.1 mEq/L (ref 3.7–5.3)
Sodium: 140 mEq/L (ref 137–147)

## 2013-05-07 LAB — CBC
HCT: 33.6 % — ABNORMAL LOW (ref 36.0–46.0)
Hemoglobin: 11.3 g/dL — ABNORMAL LOW (ref 12.0–15.0)
MCH: 30.1 pg (ref 26.0–34.0)
MCHC: 33.6 g/dL (ref 30.0–36.0)
MCV: 89.4 fL (ref 78.0–100.0)
PLATELETS: 160 10*3/uL (ref 150–400)
RBC: 3.76 MIL/uL — AB (ref 3.87–5.11)
RDW: 13.2 % (ref 11.5–15.5)
WBC: 3.3 10*3/uL — ABNORMAL LOW (ref 4.0–10.5)

## 2013-05-07 LAB — GLUCOSE, CAPILLARY
GLUCOSE-CAPILLARY: 67 mg/dL — AB (ref 70–99)
Glucose-Capillary: 83 mg/dL (ref 70–99)

## 2013-05-07 MED ORDER — LACTATED RINGERS IV SOLN
INTRAVENOUS | Status: DC
Start: 2013-05-07 — End: 2013-05-07

## 2013-05-07 NOTE — Progress Notes (Signed)
Utilization Review completed.  

## 2013-05-07 NOTE — Progress Notes (Signed)
NURSING PROGRESS NOTE  Sandra Brennan [id number] Discharge Data: 05/07/2013 4:06 PM Attending Provider: [name], MD [name] [name], MD   Jolaine Click to be D/C'd Home per MD order.    All IV's discontinued with no bleeding noted or complications.  All belongings returned to patient for patient to take home..   Last Documented Vital Signs:  Blood pressure 111/73, pulse 63, temperature 97.7 F (36.5 C), temperature source Oral, resp. rate 18, height 5\' 1"  (1.549 m), weight 102.785 kg (226 lb 9.6 oz), SpO2 97.00%.  Joslyn Hy, MSN, RN, [employer]

## 2013-05-07 NOTE — Progress Notes (Signed)
Hypoglycemic Event  CBG: 67   Treatment: 15 GM carbohydrate snack  Symptoms: None  Follow-up CBG: Time:0818 CBG Result:83  Possible Reasons for Event: Inadequate meal intake  Comments/MD notified:Dr. Elmahi notified and in room.    Sandra Brennan  Remember to initiate Hypoglycemia Order Set & complete

## 2013-05-07 NOTE — Discharge Summary (Signed)
Physician Discharge Summary  Sandra Brennan OBS:[id number] DOB: October 18, 1945 DOA: 05/05/2013  PCP: Leola Brazil, MD  Admit date: 05/05/2013 Discharge date: 05/07/2013  Time spent: 40 minutes  Recommendations for Outpatient Follow-up:  1. Followup with primary care physician within one week. 2. Check BMP in 1 week  Discharge Diagnoses:  Principal Problem:   Viral gastroenteritis Active Problems:   Osteoarthritis of left hip   Diabetes mellitus type 2 in obese   Hypertension   Asthma, chronic   Sleep apnea   Gout   Volume depletion, gastrointestinal loss   Acute nontraumatic kidney injury   Obesity   Discharge Condition: Stable  Diet recommendation: Carb modified  Filed Weights   05/05/13 1108 05/05/13 1857  Weight: 99.791 kg (220 lb) 102.785 kg (226 lb 9.6 oz)    History of present illness:  Sandra Brennan is a 68 y.o. female , known h/o THA 06/08/63 complicated by Corynebacterium striatum infection [foll dr snider for IV abx-Vancomycin 4 weeks], h/o gout, H/o MI ? 1991, history of asthma diagnosed at age 16, diabetes mellitus on metformin, DDD, Knee replacment 2001, MOd sleep apnea NOt [facility] CPAP[facility] with profuse diarrhea starting 05/02/13  States that she ordered [place] take-out from [employer]" on 05/02/48 in-she had a pork fried rice, eventual addition and about 2 hours later experienced first episode of watery yellow diarrhea at around 10:30 PM that progressed to about 10-15 times per day  She states that she had some mild abdominal pain and has only been able to drink water.  On 05/04/13, she was trying to take this on your but this completely around to her and she has not really eaten anything since then.  She tells me that she has a history of chest pain that's been worked up by her primary care physician which was thought to be secondary to PICC line placement for chronic antibiotic use last year.  She also states that she's had significant nausea  but no episodes of vomiting and took some for acid reflux medications  It is noted that she takes antihypertensives with diuretics and takes diclofenac as well for chronic pains as well as metformin.  Hospital Course:   Gastroenteritis  -Bacterial versus viral gastroenteritis, likely viral. -Stool cultures pending, C-diff negative.  -Supportive care with IV fluids, antiemetics and n.p.o.  -Advance diet as tolerated, tolerated regular diet today.   Acute Kidney Injury  -Secondary to NSAID use, volume depletion and thiazide diuretic  -Hold triamterene-HCTZ and Diclofenac-Misoprostol  -Hydrate gently presented with creatinine of 2.19, discharge creatinine today is 1.34.  Metabolic Acidosis  -Due to GI losses and acute renal failure  -Should resolve with correction of diarrhea and hydration  -Monitor   Osteoarthritis  -Left hip OA  -Continue Percocet; Hold diclofenac.   Diabetes mellitus type 2 with Peripheral Neuropathy  -Controlled  -Hold metformin.  -Monitor blood sugars a.m.  -Continue gabapentin 800 daily   Hypertension  -Stable  -Hold diuretic, consider hydralazine if needed   Asthma  -Chronic  -Continue albuterol inhaler prn   Gout  -Continue colchicine 0.6 twice a day when necessary    Procedures:  None  Consultations:  None  Discharge Exam: Filed Vitals:   05/07/13 1339  BP: 111/73  Pulse: 63  Temp: 97.7 F (36.5 C)  Resp: 18   General: Alert and awake, oriented x3, not in any acute distress. HEENT: anicteric sclera, pupils reactive to light and accommodation, EOMI CVS: S1-S2 clear, no murmur  rubs or gallops Chest: clear to auscultation bilaterally, no wheezing, rales or rhonchi Abdomen: soft nontender, nondistended, normal bowel sounds, no organomegaly Extremities: no cyanosis, clubbing or edema noted bilaterally Neuro: Cranial nerves II-XII intact, no focal neurological deficits  Discharge Instructions  Discharge Orders   Future Orders  Complete By Expires   Diet - low sodium heart healthy  As directed    Increase activity slowly  As directed        Medication List         albuterol 108 (90 BASE) MCG/ACT inhaler  Commonly known as:  PROVENTIL HFA;VENTOLIN HFA  Inhale 2 puffs into the lungs every 6 (six) hours as needed for wheezing.     aspirin EC 81 MG tablet  Take 81 mg by mouth daily.     colchicine 0.6 MG tablet  Take 0.6 mg by mouth 2 (two) times daily.     cycloSPORINE 0.05 % ophthalmic emulsion  Commonly known as:  RESTASIS  Place 1 drop into both eyes 2 (two) times daily.     Diclofenac-Misoprostol 75-0.2 MG Tbec  Take 1 tablet by mouth 2 (two) times daily.     gabapentin 800 MG tablet  Commonly known as:  NEURONTIN  Take 800 mg by mouth daily.     HYDROcodone-acetaminophen 5-325 MG per tablet  Commonly known as:  NORCO  Take 1-2 tablets by mouth every 4 (four) hours as needed for pain.     metFORMIN 500 MG (MOD) 24 hr tablet  Commonly known as:  GLUMETZA  Take 500 mg by mouth daily with breakfast.     potassium chloride SA 20 MEQ tablet  Commonly known as:  K-DUR,KLOR-CON  Take 20 mEq by mouth 2 (two) times daily.     triamterene-hydrochlorothiazide 37.5-25 MG per tablet  Commonly known as:  MAXZIDE-25  Take 1 tablet by mouth daily.       Allergies  Allergen Reactions  . Penicillins Hives  . Sulfa Antibiotics Other (See Comments)    REACTION: unknown  . Theophyllines Hives       Follow-up Information   Follow up with KILPATRICK JR,GEORGE R, MD In 1 week.   Specialty:  Pulmonary Disease   Contact information:   [place] [address] [phone]        The results of significant diagnostics from this hospitalization (including imaging, microbiology, ancillary and laboratory) are listed below for reference.    Significant Diagnostic Studies: Ct Abdomen Pelvis Wo Contrast  05/05/2013   CLINICAL DATA:  Generalized weakness with back and abdominal pain  and diarrhea.  EXAM: CT ABDOMEN AND PELVIS WITHOUT CONTRAST  TECHNIQUE: Multidetector CT imaging of the abdomen and pelvis was performed following the standard protocol without intravenous contrast.  COMPARISON:  CT scan of the abdomen and pelvis dated March 24, 2009  FINDINGS: The liver exhibits no focal mass nor ductal dilation. The gallbladder is adequately distended with no evidence of calcified stones. The pancreas, spleen, nondistended stomach, adrenal glands, and kidneys exhibit no acute abnormalities. There is a stable appearing cystic structure in the lower pole of the left kidney. There is a stable cystic structure in the mid to lower pole of the right kidney as well. No calcified stones are evident. The caliber of the abdominal aorta is normal. These psoas musculature exhibits no abnormal densities. There is no periaortic or pericaval lymphadenopathy.  The unopacified loops of small and large bowel exhibit no evidence of ileus nor obstruction. Liquid stool is  demonstrated in the transverse colon. There is no evidence of acute diverticulitis or acute appendicitis. Beam hardening artifact from the prosthetic left hip obscures much of the lower pelvic structures. There is no inguinal nor umbilical hernia.  The lumbar vertebral bodies are preserved in height. There is degenerative disc space narrowing in the lower thoracic and upper lumbar spine. There is no spondylolisthesis. The bony pelvis exhibits no acute abnormalities. There is a prosthetic left hip joint.  The lung bases are clear.  IMPRESSION: 1. There is a diarrheal process present with a moderate amount of liquid stool present within the colon. There is no evidence of bowel wall thickening or pericolonic inflammatory change. There is no evidence of diverticulitis or acute appendicitis. There is no small or large bowel obstruction. 2. There is no acute hepatobiliary or urinary tract abnormality. 3. There is no evidence of intra-abdominal  lymphadenopathy or of ascites. 4. There are degenerative changes of the lower thoracic and upper lumbar discs.   Electronically Signed   By: David  Martinique   On: 05/05/2013 15:57    Microbiology: Recent Results (from the past 240 hour(s))  CLOSTRIDIUM DIFFICILE BY PCR     Status: None   Collection Time    05/05/13  1:22 PM      Result Value Ref Range Status   C difficile by pcr NEGATIVE  NEGATIVE Final  STOOL CULTURE     Status: None   Collection Time    05/05/13  1:22 PM      Result Value Ref Range Status   Specimen Description STOOL   Final   Special Requests NONE   Final   Culture     Final   Value: NO SUSPICIOUS COLONIES, CONTINUING TO HOLD     Performed at [facility]   Report Status PENDING   Incomplete     Labs: Basic Metabolic Panel:  Recent Labs Lab 05/05/13 1207 05/06/13 0540 05/07/13 0555  NA 138 135* 140  K 4.6 3.9 4.1  CL 106 104 108  CO2 14* 15* 20  GLUCOSE 81 67* 75  BUN 32* 26* 17  CREATININE 2.19* 1.50* 1.34*  CALCIUM 9.3 8.8 8.6   Liver Function Tests:  Recent Labs Lab 05/05/13 1207 05/06/13 0540  AST 30 25  ALT 23 19  ALKPHOS 103 80  BILITOT 0.3 0.3  PROT 8.1 6.7  ALBUMIN 4.0 3.3*    Recent Labs Lab 05/05/13 1207  LIPASE 41   No results found for this basename: AMMONIA,  in the last 168 hours CBC:  Recent Labs Lab 05/05/13 1207 05/06/13 0540 05/07/13 0555  WBC 5.2 3.0* 3.3*  NEUTROABS 2.8  --   --   HGB 13.6 11.9* 11.3*  HCT 40.1 35.3* 33.6*  MCV 89.9 90.3 89.4  PLT 213 169 160   Cardiac Enzymes: No results found for this basename: CKTOTAL, CKMB, CKMBINDEX, TROPONINI,  in the last 168 hours BNP: BNP (last 3 results) No results found for this basename: PROBNP,  in the last 8760 hours CBG:  Recent Labs Lab 05/06/13 0801 05/06/13 0849 05/07/13 0755 05/07/13 0818  GLUCAP 66* 81 67* 83       Signed:  Cristofer Yaffe A  [facility] 05/07/2013, 1:46 PM

## 2013-05-09 LAB — STOOL CULTURE

## 2013-05-17 DIAGNOSIS — J449 Chronic obstructive pulmonary disease, unspecified: Secondary | ICD-10-CM | POA: Diagnosis not present

## 2013-05-17 DIAGNOSIS — R3989 Other symptoms and signs involving the genitourinary system: Secondary | ICD-10-CM | POA: Diagnosis not present

## 2013-05-17 DIAGNOSIS — I119 Hypertensive heart disease without heart failure: Secondary | ICD-10-CM | POA: Diagnosis not present

## 2013-05-17 DIAGNOSIS — E119 Type 2 diabetes mellitus without complications: Secondary | ICD-10-CM | POA: Diagnosis not present

## 2013-05-17 DIAGNOSIS — M2550 Pain in unspecified joint: Secondary | ICD-10-CM | POA: Diagnosis not present

## 2013-05-20 DIAGNOSIS — M25559 Pain in unspecified hip: Secondary | ICD-10-CM | POA: Diagnosis not present

## 2013-05-24 ENCOUNTER — Other Ambulatory Visit: Payer: Self-pay | Admitting: Orthopaedic Surgery

## 2013-05-24 DIAGNOSIS — M545 Low back pain, unspecified: Secondary | ICD-10-CM

## 2013-05-25 ENCOUNTER — Ambulatory Visit
Admission: RE | Admit: 2013-05-25 | Discharge: 2013-05-25 | Disposition: A | Discharge status: Home / Self Care | Payer: Medicare Other | Source: Ambulatory Visit | Attending: Orthopaedic Surgery | Admitting: Orthopaedic Surgery

## 2013-05-25 DIAGNOSIS — M545 Low back pain, unspecified: Secondary | ICD-10-CM

## 2013-05-25 DIAGNOSIS — M47817 Spondylosis without myelopathy or radiculopathy, lumbosacral region: Secondary | ICD-10-CM | POA: Diagnosis not present

## 2013-05-25 DIAGNOSIS — M48061 Spinal stenosis, lumbar region without neurogenic claudication: Secondary | ICD-10-CM | POA: Diagnosis not present

## 2013-05-30 DIAGNOSIS — M5137 Other intervertebral disc degeneration, lumbosacral region: Secondary | ICD-10-CM | POA: Diagnosis not present

## 2013-05-30 DIAGNOSIS — M48061 Spinal stenosis, lumbar region without neurogenic claudication: Secondary | ICD-10-CM | POA: Diagnosis not present

## 2013-06-30 DIAGNOSIS — IMO0002 Reserved for concepts with insufficient information to code with codable children: Secondary | ICD-10-CM | POA: Diagnosis not present

## 2013-06-30 DIAGNOSIS — M48061 Spinal stenosis, lumbar region without neurogenic claudication: Secondary | ICD-10-CM | POA: Diagnosis not present

## 2013-06-30 DIAGNOSIS — M47817 Spondylosis without myelopathy or radiculopathy, lumbosacral region: Secondary | ICD-10-CM | POA: Diagnosis not present

## 2013-07-12 DIAGNOSIS — M48061 Spinal stenosis, lumbar region without neurogenic claudication: Secondary | ICD-10-CM | POA: Diagnosis not present

## 2013-07-12 DIAGNOSIS — M47817 Spondylosis without myelopathy or radiculopathy, lumbosacral region: Secondary | ICD-10-CM | POA: Diagnosis not present

## 2013-07-12 DIAGNOSIS — IMO0002 Reserved for concepts with insufficient information to code with codable children: Secondary | ICD-10-CM | POA: Diagnosis not present

## 2013-08-01 ENCOUNTER — Other Ambulatory Visit: Payer: Self-pay | Admitting: Orthopaedic Surgery

## 2013-08-01 DIAGNOSIS — M25579 Pain in unspecified ankle and joints of unspecified foot: Secondary | ICD-10-CM | POA: Diagnosis not present

## 2013-08-01 DIAGNOSIS — M25571 Pain in right ankle and joints of right foot: Secondary | ICD-10-CM

## 2013-08-01 DIAGNOSIS — M79671 Pain in right foot: Secondary | ICD-10-CM

## 2013-08-01 DIAGNOSIS — M48061 Spinal stenosis, lumbar region without neurogenic claudication: Secondary | ICD-10-CM | POA: Diagnosis not present

## 2013-08-01 DIAGNOSIS — M25572 Pain in left ankle and joints of left foot: Principal | ICD-10-CM

## 2013-08-01 DIAGNOSIS — M79672 Pain in left foot: Secondary | ICD-10-CM

## 2013-08-02 ENCOUNTER — Other Ambulatory Visit: Payer: Self-pay | Admitting: Orthopaedic Surgery

## 2013-08-02 DIAGNOSIS — M25571 Pain in right ankle and joints of right foot: Secondary | ICD-10-CM

## 2013-08-03 ENCOUNTER — Encounter (INDEPENDENT_AMBULATORY_CARE_PROVIDER_SITE_OTHER): Payer: Self-pay

## 2013-08-03 ENCOUNTER — Ambulatory Visit
Admission: RE | Admit: 2013-08-03 | Discharge: 2013-08-03 | Disposition: A | Discharge status: Home / Self Care | Payer: Medicare Other | Source: Ambulatory Visit | Attending: Orthopaedic Surgery | Admitting: Orthopaedic Surgery

## 2013-08-03 DIAGNOSIS — M79671 Pain in right foot: Secondary | ICD-10-CM

## 2013-08-03 DIAGNOSIS — M25572 Pain in left ankle and joints of left foot: Principal | ICD-10-CM

## 2013-08-03 DIAGNOSIS — M25571 Pain in right ankle and joints of right foot: Secondary | ICD-10-CM

## 2013-08-03 DIAGNOSIS — M79672 Pain in left foot: Secondary | ICD-10-CM

## 2013-08-03 DIAGNOSIS — M79609 Pain in unspecified limb: Secondary | ICD-10-CM | POA: Diagnosis not present

## 2013-08-04 ENCOUNTER — Ambulatory Visit
Admission: RE | Admit: 2013-08-04 | Discharge: 2013-08-04 | Disposition: A | Discharge status: Home / Self Care | Payer: Medicare Other | Source: Ambulatory Visit | Attending: Orthopaedic Surgery | Admitting: Orthopaedic Surgery

## 2013-08-04 DIAGNOSIS — M25579 Pain in unspecified ankle and joints of unspecified foot: Secondary | ICD-10-CM | POA: Diagnosis not present

## 2013-08-04 DIAGNOSIS — M25571 Pain in right ankle and joints of right foot: Secondary | ICD-10-CM

## 2013-08-08 DIAGNOSIS — IMO0002 Reserved for concepts with insufficient information to code with codable children: Secondary | ICD-10-CM | POA: Diagnosis not present

## 2013-08-08 DIAGNOSIS — M5137 Other intervertebral disc degeneration, lumbosacral region: Secondary | ICD-10-CM | POA: Diagnosis not present

## 2013-08-08 DIAGNOSIS — M48061 Spinal stenosis, lumbar region without neurogenic claudication: Secondary | ICD-10-CM | POA: Diagnosis not present

## 2013-08-08 DIAGNOSIS — M47817 Spondylosis without myelopathy or radiculopathy, lumbosacral region: Secondary | ICD-10-CM | POA: Diagnosis not present

## 2013-08-10 DIAGNOSIS — M48061 Spinal stenosis, lumbar region without neurogenic claudication: Secondary | ICD-10-CM | POA: Diagnosis not present

## 2013-08-10 DIAGNOSIS — M47817 Spondylosis without myelopathy or radiculopathy, lumbosacral region: Secondary | ICD-10-CM | POA: Diagnosis not present

## 2013-08-10 DIAGNOSIS — IMO0002 Reserved for concepts with insufficient information to code with codable children: Secondary | ICD-10-CM | POA: Diagnosis not present

## 2013-08-31 DIAGNOSIS — E1136 Type 2 diabetes mellitus with diabetic cataract: Secondary | ICD-10-CM | POA: Diagnosis not present

## 2013-08-31 DIAGNOSIS — E119 Type 2 diabetes mellitus without complications: Secondary | ICD-10-CM | POA: Diagnosis not present

## 2013-08-31 DIAGNOSIS — H16229 Keratoconjunctivitis sicca, not specified as Sjogren's, unspecified eye: Secondary | ICD-10-CM | POA: Diagnosis not present

## 2013-10-25 DIAGNOSIS — E119 Type 2 diabetes mellitus without complications: Secondary | ICD-10-CM | POA: Diagnosis not present

## 2013-10-25 DIAGNOSIS — Z79899 Other long term (current) drug therapy: Secondary | ICD-10-CM | POA: Diagnosis not present

## 2013-10-25 DIAGNOSIS — I119 Hypertensive heart disease without heart failure: Secondary | ICD-10-CM | POA: Diagnosis not present

## 2013-10-25 DIAGNOSIS — E559 Vitamin D deficiency, unspecified: Secondary | ICD-10-CM | POA: Diagnosis not present

## 2013-10-25 DIAGNOSIS — R51 Headache: Secondary | ICD-10-CM | POA: Diagnosis not present

## 2013-10-25 DIAGNOSIS — J449 Chronic obstructive pulmonary disease, unspecified: Secondary | ICD-10-CM | POA: Diagnosis not present

## 2013-10-25 DIAGNOSIS — M2550 Pain in unspecified joint: Secondary | ICD-10-CM | POA: Diagnosis not present

## 2013-11-01 LAB — HM MAMMOGRAPHY

## 2013-11-15 DIAGNOSIS — Z1231 Encounter for screening mammogram for malignant neoplasm of breast: Secondary | ICD-10-CM | POA: Diagnosis not present

## 2013-12-29 DIAGNOSIS — M47817 Spondylosis without myelopathy or radiculopathy, lumbosacral region: Secondary | ICD-10-CM | POA: Diagnosis not present

## 2013-12-29 DIAGNOSIS — M19071 Primary osteoarthritis, right ankle and foot: Secondary | ICD-10-CM | POA: Diagnosis not present

## 2013-12-29 DIAGNOSIS — M545 Low back pain: Secondary | ICD-10-CM | POA: Diagnosis not present

## 2013-12-29 DIAGNOSIS — M4806 Spinal stenosis, lumbar region: Secondary | ICD-10-CM | POA: Diagnosis not present

## 2014-01-09 DIAGNOSIS — M47817 Spondylosis without myelopathy or radiculopathy, lumbosacral region: Secondary | ICD-10-CM | POA: Diagnosis not present

## 2014-01-09 DIAGNOSIS — M19071 Primary osteoarthritis, right ankle and foot: Secondary | ICD-10-CM | POA: Diagnosis not present

## 2014-01-09 DIAGNOSIS — M4806 Spinal stenosis, lumbar region: Secondary | ICD-10-CM | POA: Diagnosis not present

## 2014-02-16 DIAGNOSIS — R350 Frequency of micturition: Secondary | ICD-10-CM | POA: Diagnosis not present

## 2014-02-16 DIAGNOSIS — J449 Chronic obstructive pulmonary disease, unspecified: Secondary | ICD-10-CM | POA: Diagnosis not present

## 2014-02-16 DIAGNOSIS — M2550 Pain in unspecified joint: Secondary | ICD-10-CM | POA: Diagnosis not present

## 2014-02-16 DIAGNOSIS — E119 Type 2 diabetes mellitus without complications: Secondary | ICD-10-CM | POA: Diagnosis not present

## 2014-02-16 DIAGNOSIS — I119 Hypertensive heart disease without heart failure: Secondary | ICD-10-CM | POA: Diagnosis not present

## 2014-04-04 DIAGNOSIS — E1140 Type 2 diabetes mellitus with diabetic neuropathy, unspecified: Secondary | ICD-10-CM | POA: Diagnosis not present

## 2014-04-04 DIAGNOSIS — M2550 Pain in unspecified joint: Secondary | ICD-10-CM | POA: Diagnosis not present

## 2014-04-04 DIAGNOSIS — G43909 Migraine, unspecified, not intractable, without status migrainosus: Secondary | ICD-10-CM | POA: Diagnosis not present

## 2014-04-04 DIAGNOSIS — I119 Hypertensive heart disease without heart failure: Secondary | ICD-10-CM | POA: Diagnosis not present

## 2014-04-04 DIAGNOSIS — K210 Gastro-esophageal reflux disease with esophagitis: Secondary | ICD-10-CM | POA: Diagnosis not present

## 2014-04-11 ENCOUNTER — Emergency Department (HOSPITAL_COMMUNITY)
Admission: EM | Admit: 2014-04-11 | Discharge: 2014-04-11 | Disposition: A | Discharge status: Home / Self Care | Payer: Medicare Other | Attending: Emergency Medicine | Admitting: Emergency Medicine

## 2014-04-11 ENCOUNTER — Encounter (HOSPITAL_COMMUNITY): Payer: Self-pay | Admitting: *Deleted

## 2014-04-11 DIAGNOSIS — M109 Gout, unspecified: Secondary | ICD-10-CM | POA: Diagnosis not present

## 2014-04-11 DIAGNOSIS — Z7982 Long term (current) use of aspirin: Secondary | ICD-10-CM | POA: Insufficient documentation

## 2014-04-11 DIAGNOSIS — M79604 Pain in right leg: Secondary | ICD-10-CM | POA: Insufficient documentation

## 2014-04-11 DIAGNOSIS — Z87891 Personal history of nicotine dependence: Secondary | ICD-10-CM | POA: Diagnosis not present

## 2014-04-11 DIAGNOSIS — I252 Old myocardial infarction: Secondary | ICD-10-CM | POA: Insufficient documentation

## 2014-04-11 DIAGNOSIS — G43909 Migraine, unspecified, not intractable, without status migrainosus: Secondary | ICD-10-CM | POA: Diagnosis not present

## 2014-04-11 DIAGNOSIS — Z79899 Other long term (current) drug therapy: Secondary | ICD-10-CM | POA: Insufficient documentation

## 2014-04-11 DIAGNOSIS — M79671 Pain in right foot: Secondary | ICD-10-CM | POA: Diagnosis present

## 2014-04-11 DIAGNOSIS — G8929 Other chronic pain: Secondary | ICD-10-CM | POA: Insufficient documentation

## 2014-04-11 DIAGNOSIS — M1990 Unspecified osteoarthritis, unspecified site: Secondary | ICD-10-CM | POA: Diagnosis not present

## 2014-04-11 DIAGNOSIS — I10 Essential (primary) hypertension: Secondary | ICD-10-CM | POA: Diagnosis not present

## 2014-04-11 DIAGNOSIS — Z8719 Personal history of other diseases of the digestive system: Secondary | ICD-10-CM | POA: Insufficient documentation

## 2014-04-11 DIAGNOSIS — J45909 Unspecified asthma, uncomplicated: Secondary | ICD-10-CM | POA: Insufficient documentation

## 2014-04-11 DIAGNOSIS — Z791 Long term (current) use of non-steroidal anti-inflammatories (NSAID): Secondary | ICD-10-CM | POA: Insufficient documentation

## 2014-04-11 DIAGNOSIS — E119 Type 2 diabetes mellitus without complications: Secondary | ICD-10-CM | POA: Diagnosis not present

## 2014-04-11 DIAGNOSIS — Z880 Allergy status to penicillin: Secondary | ICD-10-CM | POA: Diagnosis not present

## 2014-04-11 MED ORDER — OXYCODONE-ACETAMINOPHEN 5-325 MG PO TABS
1.0000 | ORAL_TABLET | Freq: Once | ORAL | Status: AC
  Administered 2014-04-11: 1 via ORAL
  Filled 2014-04-11: qty 1

## 2014-04-11 NOTE — ED Notes (Addendum)
Severe rt. Foot numbness, but when standing it is painful. Chronic low back pain. Left leg hurting b/c of hip and is longer than the rt. Took vicodin this morning.

## 2014-04-11 NOTE — ED Notes (Signed)
Pt. Left with all belongings 

## 2014-04-11 NOTE — Discharge Instructions (Signed)
Read the information below.  You may return to the Emergency Department at any time for worsening condition or any new symptoms that concern you.   If you develop uncontrolled pain, weakness or numbness of the extremity, severe discoloration of the skin, or you are unable to walk, return to the ER for a recheck.    °

## 2014-04-11 NOTE — ED Provider Notes (Signed)
CSN: [id number]     Arrival date & time 04/11/14  1756 History  This chart was scribed for non-physician practitioner, Clayton Bibles, PA-C working with Hoy Morn, MD by Frederich Balding, ED scribe. This patient was seen in room TR10C/TR10C and the patient's care was started at 7:24 PM.   Chief Complaint  Patient presents with  . Foot Pain   The history is provided by the patient. No language interpreter was used.    HPI Comments: Sandra Brennan is a 69 y.o. female with history of degenerative arthritis who presents to the Emergency Department complaining of worsening sharp right ankle and pain that radiates up her leg into her hip that started last night. She rates pain 10/10. Pt reports associated numbness in her foot that started today. She states she is having also pain and tightness in her calf. Bearing weight worsens pain but rest and elevation relieve some of it. She has taken Vicodin with no relief. Pt has been told by her orthopedist that she needs surgery due to her arthritis. She is also complaining of chronic back pain. Pt gets injections for pain, the last one being 2 months ago. She states she has started to stumble more often. Pt denies fever, chills, abdominal pain, nausea, constipation, dysuria, urinary frequency, urgency, hematuria, body aches, weakness. She denies personal or family history of DVT or PE. Pt is not currently on estrogen supplements.   Past Medical History  Diagnosis Date  . Hypertension   . Asthma   . Shortness of breath     when stressed.  . Myocardial infarction 1992    2012-08-24 "mild MI when son died "  . GERD (gastroesophageal reflux disease)   . Sleep apnea     "never did fix my machine; haven't used one for 5 years; I've lost 116# since then; no problems now" (2012/08/24)  . Type II diabetes mellitus     "borderline; don't test; take Metformin" (2012/08/24)  . Anemia   . Migraines     "used to; totally stopped when I quit drinking 30 yr ago" (08-24-12)   . Arthritis     "plenty" (Aug 24, 2012)  . Degenerative arthritis     "all over" (08/24/2012)  . Osteoporosis     "all over" (08-24-12)  . Chronic lower back pain     "they say I need a whole new spinal column" (Aug 24, 2012)  . Gout 05/05/2013   Past Surgical History  Procedure Laterality Date  . Ganglion cyst excision Right 1980's    "wrist" (08-24-2012)  . Knee ligament reconstruction Right 1980's  . Total hip arthroplasty Left 24-Aug-2012  . Tonsillectomy  ~ 1954  . Total knee arthroplasty Left 2001  . Total knee arthroplasty Right 2004  . Joint replacement    . Abdominal hysterectomy  1990's  . Total hip arthroplasty Left 24-Aug-2012    Procedure: TOTAL HIP ARTHROPLASTY;  Surgeon: Garald Balding, MD;  Location: [place] [facility];  Service: Orthopedics;  Laterality: Left;  Left Total Hip Arthroplasty  . Total hip arthroplasty Left 08/28/2012    Procedure: Irrigation and Debridement hip ;  Surgeon: Mcarthur Rossetti, MD;  Location: [facility];  Service: Orthopedics;  Laterality: Left;   Family History  Problem Relation Age of Onset  . Arthritis Mother   . Arthritis Father   . Diabetes Sister   . Arthritis Sister   . Diabetes Maternal Grandmother   . Arthritis Maternal Grandmother   . Diabetes Maternal Grandfather   . Arthritis  Maternal Grandfather    History  Substance Use Topics  . Smoking status: Former Smoker -- 0.12 packs/day for 2 years    Types: Cigarettes  . Smokeless tobacco: Never Used     Comment: 08/03/2012 "quit 30 years ago"  . Alcohol Use: No     Comment: 08/03/2012 "used to be a beeralcoholic; stopped ~ 30 yr ago"   OB History    No data available     Review of Systems  Constitutional: Negative for fever and chills.  Gastrointestinal: Negative for nausea.  Genitourinary: Negative for dysuria, urgency, frequency and hematuria.  Musculoskeletal: Positive for myalgias, back pain and arthralgias.  Neurological: Positive for numbness. Negative for weakness.  All other systems  reviewed and are negative.  Allergies  Penicillins; Sulfa antibiotics; and Theophyllines  Home Medications   Prior to Admission medications   Medication Sig Start Date End Date Taking? Authorizing Provider  albuterol (PROVENTIL HFA;VENTOLIN HFA) 108 (90 BASE) MCG/ACT inhaler Inhale 2 puffs into the lungs every 6 (six) hours as needed for wheezing.    Historical Provider, MD  aspirin EC 81 MG tablet Take 81 mg by mouth daily.    Historical Provider, MD  colchicine 0.6 MG tablet Take 0.6 mg by mouth 2 (two) times daily.    Historical Provider, MD  cycloSPORINE (RESTASIS) 0.05 % ophthalmic emulsion Place 1 drop into both eyes 2 (two) times daily.    Historical Provider, MD  Diclofenac-Misoprostol 75-0.2 MG TBEC Take 1 tablet by mouth 2 (two) times daily.    Historical Provider, MD  gabapentin (NEURONTIN) 800 MG tablet Take 800 mg by mouth daily.     Historical Provider, MD  HYDROcodone-acetaminophen (NORCO) 5-325 MG per tablet Take 1-2 tablets by mouth every 4 (four) hours as needed for pain. 08/31/12   Mcarthur Rossetti, MD  metFORMIN (GLUMETZA) 500 MG (MOD) 24 hr tablet Take 500 mg by mouth daily with breakfast.    Historical Provider, MD  potassium chloride SA (K-DUR,KLOR-CON) 20 MEQ tablet Take 20 mEq by mouth 2 (two) times daily.    Historical Provider, MD  triamterene-hydrochlorothiazide (MAXZIDE-25) 37.5-25 MG per tablet Take 1 tablet by mouth daily.    Historical Provider, MD   BP 132/75 mmHg  Temp(Src) 97.7 F (36.5 C) (Oral)  Resp 14  SpO2 99%   Physical Exam  Constitutional: She appears well-developed and well-nourished. No distress.  HENT:  Head: Normocephalic and atraumatic.  Neck: Neck supple.  Pulmonary/Chest: Effort normal.  Musculoskeletal:       Back:   Spine nontender, no crepitus, or stepoffs. RIGHT lower extremity: Right foot with intact but decreased sensation throughout, not dermatomal.  Dorsalis pedis pulse is intact and foot is warm.  Diffuse swelling and  tenderness of right ankle and lower leg.  No focal tenderness.    Neurological: She is alert.  Skin: She is not diaphoretic.  Nursing note and vitals reviewed.  ED Course  Procedures (including critical care time)  DIAGNOSTIC STUDIES: Oxygen Saturation is 99% on RA, normal by my interpretation.    COORDINATION OF CARE: 7:35 PM-Discussed treatment plan which includes pain control with pt at bedside and pt agreed to plan.   7:57 PM-Dr. Venora Maples saw and evaluated pt. Will not do emergent imaging. Advised pt to follow up with PCP and orthopedics. Suggested request for pain clinic referral from PCP.  Labs Review Labs Reviewed - No data to display  Imaging Review No results found.   EKG Interpretation None  MDM   Final diagnoses:  Right leg pain    Afebrile, nontoxic patient with chronic back pain and chronic arthritis.  No new injuries. Discussed pt with Dr Venora Maples and he also saw and evaluated the patient, recommended patient to be d/c home with close PCP follow up with possible referral to pain management, advises no further testing necessary at this time.  Pain likely due to chronic arthritis.  Please see his note for further details. Pain treated in ED.  D/C home with PCP follow up.  Discussed result, findings, treatment, and follow up  with patient.  Pt given return precautions.  Pt verbalizes understanding and agrees with plan.       I personally performed the services described in this documentation, which was scribed in my presence. The recorded information has been reviewed and is accurate.   Clayton Bibles, PA-C 04/11/14 [place], MD 04/12/14 [phone]

## 2014-05-09 DIAGNOSIS — H35033 Hypertensive retinopathy, bilateral: Secondary | ICD-10-CM | POA: Diagnosis not present

## 2014-05-09 DIAGNOSIS — E119 Type 2 diabetes mellitus without complications: Secondary | ICD-10-CM | POA: Diagnosis not present

## 2014-06-01 DIAGNOSIS — M170 Bilateral primary osteoarthritis of knee: Secondary | ICD-10-CM | POA: Diagnosis not present

## 2014-06-01 DIAGNOSIS — M5380 Other specified dorsopathies, site unspecified: Secondary | ICD-10-CM | POA: Diagnosis not present

## 2014-06-01 DIAGNOSIS — M4806 Spinal stenosis, lumbar region: Secondary | ICD-10-CM | POA: Diagnosis not present

## 2014-06-01 DIAGNOSIS — M25572 Pain in left ankle and joints of left foot: Secondary | ICD-10-CM | POA: Diagnosis not present

## 2014-06-01 DIAGNOSIS — M25571 Pain in right ankle and joints of right foot: Secondary | ICD-10-CM | POA: Diagnosis not present

## 2014-06-20 ENCOUNTER — Encounter (INDEPENDENT_AMBULATORY_CARE_PROVIDER_SITE_OTHER): Payer: Self-pay | Admitting: Neurology

## 2014-06-20 ENCOUNTER — Ambulatory Visit (INDEPENDENT_AMBULATORY_CARE_PROVIDER_SITE_OTHER): Payer: Medicare Other | Admitting: Neurology

## 2014-06-20 DIAGNOSIS — R2689 Other abnormalities of gait and mobility: Secondary | ICD-10-CM | POA: Diagnosis not present

## 2014-06-20 DIAGNOSIS — M4806 Spinal stenosis, lumbar region: Secondary | ICD-10-CM | POA: Diagnosis not present

## 2014-06-20 DIAGNOSIS — R269 Unspecified abnormalities of gait and mobility: Secondary | ICD-10-CM

## 2014-06-20 DIAGNOSIS — R202 Paresthesia of skin: Secondary | ICD-10-CM | POA: Diagnosis not present

## 2014-06-20 DIAGNOSIS — Z0289 Encounter for other administrative examinations: Secondary | ICD-10-CM

## 2014-06-20 DIAGNOSIS — M48061 Spinal stenosis, lumbar region without neurogenic claudication: Secondary | ICD-10-CM

## 2014-06-21 NOTE — Procedures (Signed)
   NCS (NERVE CONDUCTION STUDY) WITH EMG (ELECTROMYOGRAPHY) REPORT   STUDY DATE: June 20 2014 PATIENT NAME: Sandra Brennan DOB: 1946-01-17 MRN: [id number]    TECHNOLOGIST: Towana Badger ELECTROMYOGRAPHER: Marcial Pacas M.D.  CLINICAL INFORMATION:  69 year old female, with past medical history of diabetes, complains of gradual onset gait difficulty, burning sensation of bilateral feet, had a history of bilateral knee replacement, MRI of lumbar showed multifactorial stenosis,  FINDINGS: NERVE CONDUCTION STUDY: Right sural sensory response was absent. Left sural sensory response showed mildly decreased snap amplitude, with normal distal latency. Bilateral tibial motor responses showed severely decreased C map amplitude. Left peroneal to EDB motor responses were normal. Right peroneal to EDB motor response showed severely decreased C map amplitude, with prolonged distal latency. Bilateral tibial H reflexes were absent.  Left median, ulnar motor responses were normal.  NEEDLE ELECTROMYOGRAPHY: Selected needle examination was performed at bilateral lower extremity muscles, and bilateral lumbar sacral paraspinal muscles.  Bilateral tibialis anterior: Increased insertional activity, 1-2 plus spontaneous activity, enlarged complex motor unit potential, with decreased recruitment patterns. Bilateral tibialis posterior:Increased insertional activity, 1-2 plus spontaneous activity, enlarged complex motor unit potential, with decreased recruitment patterns.  Bilateral vastus lateralis: Increased insertional activity, no spontaneous activity, mixture of normal, some enlarged motor unit potential, with decreased recruitment patterns.  Bilateral biceps femoris long head: Normal insertion activity, No spontaneous activity, mixture of normal, and some enlarged motor unit potential, with decreased recruitment patterns.  There was no spontaneous activity at bilateral lumbar sacral paraspinal muscles, poor muscle  relaxation, at bilateral L4, L5, S1.  IMPRESSION:   This is an abnormal study. There is electrodiagnostic evidence of chronic bilateral lumbosacral radiculopathy, mostly involving bilateral L4, L5 nerve roots, in addition, there is evidence of length dependent mild axonal peripheral neuropathy.   INTERPRETING PHYSICIAN:   Marcial Pacas M.D. Ph.D. [facility] [address], [employer] [place], [place] [address] [phone]

## 2014-07-04 DIAGNOSIS — Z79899 Other long term (current) drug therapy: Secondary | ICD-10-CM | POA: Diagnosis not present

## 2014-07-04 DIAGNOSIS — G43909 Migraine, unspecified, not intractable, without status migrainosus: Secondary | ICD-10-CM | POA: Diagnosis not present

## 2014-07-04 DIAGNOSIS — M2550 Pain in unspecified joint: Secondary | ICD-10-CM | POA: Diagnosis not present

## 2014-07-04 DIAGNOSIS — I119 Hypertensive heart disease without heart failure: Secondary | ICD-10-CM | POA: Diagnosis not present

## 2014-07-04 DIAGNOSIS — E1140 Type 2 diabetes mellitus with diabetic neuropathy, unspecified: Secondary | ICD-10-CM | POA: Diagnosis not present

## 2014-07-14 ENCOUNTER — Encounter: Payer: Self-pay | Admitting: Neurology

## 2014-07-14 ENCOUNTER — Ambulatory Visit (INDEPENDENT_AMBULATORY_CARE_PROVIDER_SITE_OTHER): Payer: Medicare Other | Admitting: Neurology

## 2014-07-14 VITALS — BP 167/82 | HR 76 | Ht 61.0 in | Wt 245.5 lb

## 2014-07-14 DIAGNOSIS — R202 Paresthesia of skin: Secondary | ICD-10-CM | POA: Diagnosis not present

## 2014-07-14 DIAGNOSIS — M48061 Spinal stenosis, lumbar region without neurogenic claudication: Secondary | ICD-10-CM

## 2014-07-14 DIAGNOSIS — M4806 Spinal stenosis, lumbar region: Secondary | ICD-10-CM | POA: Diagnosis not present

## 2014-07-14 DIAGNOSIS — R269 Unspecified abnormalities of gait and mobility: Secondary | ICD-10-CM

## 2014-07-14 DIAGNOSIS — R519 Headache, unspecified: Secondary | ICD-10-CM

## 2014-07-14 DIAGNOSIS — R413 Other amnesia: Secondary | ICD-10-CM | POA: Diagnosis not present

## 2014-07-14 DIAGNOSIS — R51 Headache: Secondary | ICD-10-CM | POA: Diagnosis not present

## 2014-07-14 MED ORDER — DULOXETINE HCL 60 MG PO CPEP: 60.0000 mg | ORAL_CAPSULE | Freq: Every day | ORAL | Status: DC
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
## 2014-07-15 ENCOUNTER — Emergency Department (HOSPITAL_COMMUNITY): Payer: Medicare Other

## 2014-07-15 ENCOUNTER — Emergency Department (HOSPITAL_COMMUNITY)
Admission: EM | Admit: 2014-07-15 | Discharge: 2014-07-16 | Disposition: A | Discharge status: Home / Self Care | Payer: Medicare Other | Attending: Emergency Medicine | Admitting: Emergency Medicine

## 2014-07-15 ENCOUNTER — Encounter (HOSPITAL_COMMUNITY): Payer: Self-pay | Admitting: Emergency Medicine

## 2014-07-15 DIAGNOSIS — T50905A Adverse effect of unspecified drugs, medicaments and biological substances, initial encounter: Secondary | ICD-10-CM

## 2014-07-15 DIAGNOSIS — Z880 Allergy status to penicillin: Secondary | ICD-10-CM | POA: Insufficient documentation

## 2014-07-15 DIAGNOSIS — R531 Weakness: Secondary | ICD-10-CM | POA: Insufficient documentation

## 2014-07-15 DIAGNOSIS — E119 Type 2 diabetes mellitus without complications: Secondary | ICD-10-CM | POA: Diagnosis not present

## 2014-07-15 DIAGNOSIS — I252 Old myocardial infarction: Secondary | ICD-10-CM | POA: Insufficient documentation

## 2014-07-15 DIAGNOSIS — M1990 Unspecified osteoarthritis, unspecified site: Secondary | ICD-10-CM | POA: Insufficient documentation

## 2014-07-15 DIAGNOSIS — R110 Nausea: Secondary | ICD-10-CM | POA: Diagnosis not present

## 2014-07-15 DIAGNOSIS — G8929 Other chronic pain: Secondary | ICD-10-CM | POA: Insufficient documentation

## 2014-07-15 DIAGNOSIS — T43215A Adverse effect of selective serotonin and norepinephrine reuptake inhibitors, initial encounter: Secondary | ICD-10-CM | POA: Insufficient documentation

## 2014-07-15 DIAGNOSIS — R42 Dizziness and giddiness: Secondary | ICD-10-CM | POA: Diagnosis not present

## 2014-07-15 DIAGNOSIS — E669 Obesity, unspecified: Secondary | ICD-10-CM | POA: Insufficient documentation

## 2014-07-15 DIAGNOSIS — J441 Chronic obstructive pulmonary disease with (acute) exacerbation: Secondary | ICD-10-CM | POA: Diagnosis not present

## 2014-07-15 DIAGNOSIS — Z87891 Personal history of nicotine dependence: Secondary | ICD-10-CM | POA: Diagnosis not present

## 2014-07-15 DIAGNOSIS — Z79899 Other long term (current) drug therapy: Secondary | ICD-10-CM | POA: Insufficient documentation

## 2014-07-15 DIAGNOSIS — R0789 Other chest pain: Secondary | ICD-10-CM | POA: Diagnosis not present

## 2014-07-15 DIAGNOSIS — Z862 Personal history of diseases of the blood and blood-forming organs and certain disorders involving the immune mechanism: Secondary | ICD-10-CM | POA: Insufficient documentation

## 2014-07-15 DIAGNOSIS — G43909 Migraine, unspecified, not intractable, without status migrainosus: Secondary | ICD-10-CM | POA: Insufficient documentation

## 2014-07-15 DIAGNOSIS — Z8719 Personal history of other diseases of the digestive system: Secondary | ICD-10-CM | POA: Diagnosis not present

## 2014-07-15 DIAGNOSIS — R079 Chest pain, unspecified: Secondary | ICD-10-CM | POA: Diagnosis not present

## 2014-07-15 DIAGNOSIS — T887XXA Unspecified adverse effect of drug or medicament, initial encounter: Secondary | ICD-10-CM | POA: Diagnosis not present

## 2014-07-15 DIAGNOSIS — M6281 Muscle weakness (generalized): Secondary | ICD-10-CM | POA: Diagnosis not present

## 2014-07-15 DIAGNOSIS — Z7982 Long term (current) use of aspirin: Secondary | ICD-10-CM | POA: Diagnosis not present

## 2014-07-15 DIAGNOSIS — R0602 Shortness of breath: Secondary | ICD-10-CM | POA: Diagnosis not present

## 2014-07-15 DIAGNOSIS — I10 Essential (primary) hypertension: Secondary | ICD-10-CM | POA: Diagnosis not present

## 2014-07-15 DIAGNOSIS — R069 Unspecified abnormalities of breathing: Secondary | ICD-10-CM | POA: Diagnosis not present

## 2014-07-15 LAB — CBC WITH DIFFERENTIAL/PLATELET
Basophils Absolute: 0 10*3/uL (ref 0.0–0.1)
Basophils Relative: 0 % (ref 0–1)
EOS PCT: 2 % (ref 0–5)
Eosinophils Absolute: 0.1 10*3/uL (ref 0.0–0.7)
HCT: 36.4 % (ref 36.0–46.0)
Hemoglobin: 11.6 g/dL — ABNORMAL LOW (ref 12.0–15.0)
Lymphocytes Relative: 34 % (ref 12–46)
Lymphs Abs: 1.6 10*3/uL (ref 0.7–4.0)
MCH: 28.4 pg (ref 26.0–34.0)
MCHC: 31.9 g/dL (ref 30.0–36.0)
MCV: 89.2 fL (ref 78.0–100.0)
MONO ABS: 0.5 10*3/uL (ref 0.1–1.0)
Monocytes Relative: 11 % (ref 3–12)
NEUTROS PCT: 53 % (ref 43–77)
Neutro Abs: 2.5 10*3/uL (ref 1.7–7.7)
PLATELETS: 198 10*3/uL (ref 150–400)
RBC: 4.08 MIL/uL (ref 3.87–5.11)
RDW: 13.7 % (ref 11.5–15.5)
WBC: 4.8 10*3/uL (ref 4.0–10.5)

## 2014-07-15 LAB — COMPREHENSIVE METABOLIC PANEL
ALK PHOS: 82 U/L (ref 38–126)
ALT: 25 U/L (ref 14–54)
AST: 36 U/L (ref 15–41)
Albumin: 3.5 g/dL (ref 3.5–5.0)
Anion gap: 13 (ref 5–15)
BUN: 12 mg/dL (ref 6–20)
CALCIUM: 9.3 mg/dL (ref 8.9–10.3)
CHLORIDE: 103 mmol/L (ref 101–111)
CO2: 23 mmol/L (ref 22–32)
Creatinine, Ser: 1.32 mg/dL — ABNORMAL HIGH (ref 0.44–1.00)
GFR calc Af Amer: 47 mL/min — ABNORMAL LOW (ref >60)
GFR calc non Af Amer: 41 mL/min — ABNORMAL LOW (ref >60)
GLUCOSE: 109 mg/dL — AB (ref 65–99)
POTASSIUM: 3.7 mmol/L (ref 3.5–5.1)
Sodium: 139 mmol/L (ref 135–145)
TOTAL PROTEIN: 6.7 g/dL (ref 6.5–8.1)
Total Bilirubin: 0.4 mg/dL (ref 0.3–1.2)

## 2014-07-15 LAB — BRAIN NATRIURETIC PEPTIDE: B NATRIURETIC PEPTIDE 5: 15 pg/mL (ref 0.0–100.0)

## 2014-07-15 LAB — TROPONIN I: Troponin I: <0.03 ng/mL (ref <0.031)

## 2014-07-15 LAB — LIPASE, BLOOD: Lipase: 43 U/L (ref 22–51)

## 2014-07-15 MED ORDER — LORAZEPAM 1 MG PO TABS
0.5000 mg | ORAL_TABLET | Freq: Once | ORAL | Status: AC
  Administered 2014-07-15: 0.5 mg via ORAL
  Filled 2014-07-15: qty 1

## 2014-07-15 MED ORDER — GI COCKTAIL ~~LOC~~
30.0000 mL | Freq: Once | ORAL | Status: AC
  Administered 2014-07-15: 30 mL via ORAL
  Filled 2014-07-15: qty 30

## 2014-07-15 MED ORDER — FAMOTIDINE IN NACL 20-0.9 MG/50ML-% IV SOLN
20.0000 mg | INTRAVENOUS | Status: AC
  Administered 2014-07-15: 20 mg via INTRAVENOUS
  Filled 2014-07-15: qty 50

## 2014-07-15 MED ORDER — DIPHENHYDRAMINE HCL 50 MG/ML IJ SOLN
12.5000 mg | Freq: Once | INTRAMUSCULAR | Status: AC
  Administered 2014-07-15: 12.5 mg via INTRAVENOUS
  Filled 2014-07-15: qty 1

## 2014-07-15 MED ORDER — SODIUM CHLORIDE 0.9 % IV BOLUS (SEPSIS)
1000.0000 mL | Freq: Once | INTRAVENOUS | Status: AC
  Administered 2014-07-15: 1000 mL via INTRAVENOUS

## 2014-07-15 NOTE — ED Notes (Signed)
Pt states she has very hard time to breath now, denies pain.

## 2014-07-15 NOTE — ED Provider Notes (Signed)
CSN: [id number]     Arrival date & time 07/15/14  2126 History   First MD Initiated Contact with Patient 07/15/14 2134     Chief Complaint  Patient presents with  . Gastrophageal Reflux  . Chest Pain     (Consider location/radiation/quality/duration/timing/severity/associated sxs/prior Treatment) HPI Comments: 69 year old female with a history of hypertension, shortness of breath brought on by stress, myocardial infarction, esophageal reflux, type 2 diabetes, obesity, and COPD. Patient presents to the emergency department for further evaluation of shortness of breath. She states that symptoms began at 1900, shortly after taking Cymbalta. She states that this is her second time taking a dose of this medication. Patient has had associated generalized weakness and lightheadedness as well as nausea. Patient reports "I felt like I was going to pass out". She has had the sensation to belch, but "nothing is coming up" She denies a hx of a cardiac catheterization or PCI. She is not followed by a cardiologist. Patient denies fever, chest pain, syncope, vomiting, and unilateral weakness. No angioedema or difficulty swallowing. No rashes.  Patient is a 69 y.o. female presenting with GERD and chest pain. The history is provided by the patient. No language interpreter was used.  Gastrophageal Reflux Associated symptoms include chest pain, nausea and weakness (generalized). Pertinent negatives include no fever.  Chest Pain Associated symptoms: nausea, shortness of breath and weakness (generalized)   Associated symptoms: no fever     Past Medical History  Diagnosis Date  . Hypertension   . Asthma   . Shortness of breath     when stressed.  . Myocardial infarction 1992    August 05, 2012 "mild MI when son died "  . GERD (gastroesophageal reflux disease)   . Sleep apnea     "never did fix my machine; haven't used one for 5 years; I've lost 116# since then; no problems now" (08/05/2012)  . Type II diabetes  mellitus     "borderline; don't test; take Metformin" (2012/08/05)  . Anemia   . Migraines     "used to; totally stopped when I quit drinking 30 yr ago" (August 05, 2012)  . Arthritis     "plenty" (August 05, 2012)  . Degenerative arthritis     "all over" (08/05/12)  . Osteoporosis     "all over" (08/05/2012)  . Chronic lower back pain     "they say I need a whole new spinal column" (August 05, 2012)  . Gout 05/05/2013  . Myalgia and myositis   . Obesity   . COPD (chronic obstructive pulmonary disease)    Past Surgical History  Procedure Laterality Date  . Ganglion cyst excision Right 1980's    "wrist" (2012-08-05)  . Knee ligament reconstruction Right 1980's  . Total hip arthroplasty Left 05-Aug-2012  . Tonsillectomy  ~ 1954  . Total knee arthroplasty Left 2001  . Total knee arthroplasty Right 2004  . Joint replacement    . Abdominal hysterectomy  1990's  . Total hip arthroplasty Left 08-05-12    Procedure: TOTAL HIP ARTHROPLASTY;  Surgeon: Garald Balding, MD;  Location: [place];  Service: Orthopedics;  Laterality: Left;  Left Total Hip Arthroplasty  . Total hip arthroplasty Left 08/28/2012    Procedure: Irrigation and Debridement hip ;  Surgeon: Mcarthur Rossetti, MD;  Location: [place];  Service: Orthopedics;  Laterality: Left;   Family History  Problem Relation Age of Onset  . Arthritis Mother   . Arthritis Father   . Diabetes Sister   . Arthritis Sister   .  Diabetes Maternal Grandmother   . Arthritis Maternal Grandmother   . Diabetes Maternal Grandfather   . Arthritis Maternal Grandfather   . Colon cancer Father    History  Substance Use Topics  . Smoking status: Former Smoker -- 0.12 packs/day for 2 years    Types: Cigarettes  . Smokeless tobacco: Never Used     Comment: 08/03/2012 "quit 30 years ago"  . Alcohol Use: No     Comment: 08/03/2012 "used to be a beeralcoholic"; stopped ~ 30 yr ago"   OB History    No data available      Review of Systems  Constitutional: Negative for  fever.  Respiratory: Positive for chest tightness and shortness of breath.   Cardiovascular: Positive for chest pain.  Gastrointestinal: Positive for nausea.  Neurological: Positive for weakness (generalized) and light-headedness. Negative for syncope.  All other systems reviewed and are negative.   Allergies  Penicillins; Sulfa antibiotics; and Theophyllines  Home Medications   Prior to Admission medications   Medication Sig Start Date End Date Taking? Authorizing Provider  albuterol (PROVENTIL HFA;VENTOLIN HFA) 108 (90 BASE) MCG/ACT inhaler Inhale 2 puffs into the lungs every 6 (six) hours as needed for wheezing.    Historical Provider, MD  aspirin EC 81 MG tablet Take 81 mg by mouth daily.    Historical Provider, MD  colchicine 0.6 MG tablet Take 0.6 mg by mouth 2 (two) times daily.    Historical Provider, MD  cycloSPORINE (RESTASIS) 0.05 % ophthalmic emulsion Place 1 drop into both eyes 2 (two) times daily.    Historical Provider, MD  DULoxetine (CYMBALTA) 60 MG capsule Take 1 capsule (60 mg total) by mouth daily. 07/14/14   Marcial Pacas, MD  gabapentin (NEURONTIN) 600 MG tablet  06/13/14   Historical Provider, MD  HYDROcodone-acetaminophen (NORCO) 7.5-325 MG per tablet Take 1 tablet by mouth every 6 (six) hours as needed. for pain 07/05/14   Historical Provider, MD  metFORMIN (GLUCOPHAGE-XR) 500 MG 24 hr tablet Take 500 mg by mouth every morning. 05/09/14   Historical Provider, MD  metFORMIN (GLUMETZA) 500 MG (MOD) 24 hr tablet Take 500 mg by mouth daily with breakfast.    Historical Provider, MD  potassium chloride SA (K-DUR,KLOR-CON) 20 MEQ tablet Take 20 mEq by mouth 2 (two) times daily.    Historical Provider, MD  triamterene-hydrochlorothiazide (MAXZIDE-25) 37.5-25 MG per tablet Take 1 tablet by mouth daily.    Historical Provider, MD  Vitamin D, Ergocalciferol, (DRISDOL) 50000 UNITS CAPS capsule Take 50,000 Units by mouth once a week. 05/14/14   Historical Provider, MD   BP 129/61 mmHg   Pulse 69  Temp(Src) 98.3 F (36.8 C) (Oral)  Resp 20  Ht 5\' 1"  (1.549 m)  Wt 245 lb (111.131 kg)  BMI 46.32 kg/m2  SpO2 96%   Physical Exam  Constitutional: She is oriented to person, place, and time. She appears well-developed and well-nourished. No distress.  HENT:  Head: Normocephalic and atraumatic.  Mouth/Throat: Oropharynx is clear and moist.  Oropharynx clear. No angioedema. Patient tolerating secretions without difficulty.  Eyes: Conjunctivae and EOM are normal. No scleral icterus.  Neck: Normal range of motion.  No JVD appreciated  Cardiovascular: Normal rate, regular rhythm and intact distal pulses.   Pulmonary/Chest: Effort normal and breath sounds normal. No respiratory distress. She has no wheezes. She has no rales.  Chest expansion symmetric. Respirations even. No tachypnea. Lungs clear to auscultation bilaterally.  Musculoskeletal: Normal range of motion.  Neurological: She is alert  and oriented to person, place, and time. She exhibits normal muscle tone. Coordination normal.  GCS 15. Speech is goal oriented. Patient moves extremities without ataxia.  Skin: Skin is warm and dry. No rash noted. She is not diaphoretic. No erythema. No pallor.  Psychiatric: She has a normal mood and affect. Her behavior is normal.  Nursing note and vitals reviewed.   ED Course  Procedures (including critical care time) Labs Review Labs Reviewed  CBC WITH DIFFERENTIAL/PLATELET - Abnormal; Notable for the following:    Hemoglobin 11.6 (*)    All other components within normal limits  COMPREHENSIVE METABOLIC PANEL - Abnormal; Notable for the following:    Glucose, Bld 109 (*)    Creatinine, Ser 1.32 (*)    GFR calc non Af Amer 41 (*)    GFR calc Af Amer 47 (*)    All other components within normal limits  LIPASE, BLOOD  TROPONIN I  BRAIN NATRIURETIC PEPTIDE  TROPONIN I    Imaging Review Dg Chest 2 View  07/15/2014   CLINICAL DATA:  69 year old female with weakness, dizziness  nausea and right chest pain for 4 hr.  EXAM: CHEST  2 VIEW  COMPARISON:  07/28/2012 and prior chest radiographs dating back to 06/04/2002  FINDINGS: The cardiomediastinal silhouette is unremarkable.  There is no evidence of focal airspace disease, pulmonary edema, suspicious pulmonary nodule/mass, pleural effusion, or pneumothorax. No acute bony abnormalities are identified. Apparent AVN of the humeral heads again noted.  IMPRESSION: No active cardiopulmonary disease.   Electronically Signed   By: Margarette Canada M.D.   On: 07/15/2014 23:19     EKG Interpretation   Date/Time:  Saturday Jul 15 2014 22:17:37 EDT Ventricular Rate:  72 PR Interval:  156 QRS Duration: 96 QT Interval:  408 QTC Calculation: 446 R Axis:   15 Text Interpretation:  Normal sinus rhythm Nonspecific T wave abnormality  Abnormal ECG No significant change since last tracing Confirmed by Canary Brim   MD, MARTHA [phone]) on 07/15/2014 10:51:30 PM      MDM   Final diagnoses:  Shortness of breath  Adverse effects of medication, initial encounter    69 year old female presents to the emergency department for further evaluation of shortness of breath which developed after taking Cymbalta. She has taken 2 doses of this medication and reports that symptoms occurred shortly after taking each dose. Patient denies chest pain. No syncope or fever. Cardiac workup today is negative. EKG is nonischemic.  Patient monitored in the ED for 4 hours. She has had no tachycardia or hypoxia while in the ED. Symptoms treated with Ativan, Pepcid, GI cocktail, and a drill, and IV fluids. She has remained hemodynamically stable. Laboratory workup is noncontributory. No indication for further emergent workup at this time. Doubt ACS. Symptoms likely represent adverse effects of Cymbalta; have advised that patient d/c this medication. Patient to follow-up with her primary care doctor on an outpatient basis. Return precautions discussed and provided. Patient  agreeable to plan with no unaddressed concerns. Patient discharged in good condition. Patient seen also by my attending, Dr. Canary Brim who is in agreement with this workup, assessment, management plan, and patient's stability for discharge.   Filed Vitals:   07/16/14 0130 07/16/14 0145 07/16/14 0200 07/16/14 0219  BP: 126/63 129/65 129/61   Pulse: 67 72 69   Temp:    98.3 F (36.8 C)  TempSrc:    Oral  Resp: 20 20 20    Height:      Weight:  SpO2: 96% 97% 96%        Antonietta Breach, PA-C 07/16/14 [place], MD 07/16/14 [phone]

## 2014-07-15 NOTE — ED Notes (Signed)
Patient transported to X-ray 

## 2014-07-15 NOTE — ED Notes (Signed)
Per EMS- patient states when she tries to belch she cannot get anything up. Pt also has upper right sided chest pain that started in route (15-20 minutes ago). No meds given in route. No IV placed. No respiratory distress noted but patient states she can't breathe when she tries to burp.

## 2014-07-15 NOTE — ED Notes (Signed)
Pt reports "I am short of breathe after taking a Cymbalta. I also have been having chest pain, they were supposed to make me an appointment with a cardiologist but have not. I also feel weak and dizzy and sometimes like I am going to pass out." Pt states discomfort with taking deep breathe.

## 2014-07-16 DIAGNOSIS — R079 Chest pain, unspecified: Secondary | ICD-10-CM | POA: Diagnosis not present

## 2014-07-16 LAB — COMPREHENSIVE METABOLIC PANEL
ALT: 28 IU/L (ref 0–32)
AST: 39 IU/L (ref 0–40)
Albumin/Globulin Ratio: 1.7 (ref 1.1–2.5)
Albumin: 4.3 g/dL (ref 3.6–4.8)
Alkaline Phosphatase: 95 IU/L (ref 39–117)
BUN/Creatinine Ratio: 11 (ref 11–26)
BUN: 12 mg/dL (ref 8–27)
Bilirubin Total: 0.2 mg/dL (ref 0.0–1.2)
CALCIUM: 9.4 mg/dL (ref 8.7–10.3)
CO2: 23 mmol/L (ref 18–29)
Chloride: 102 mmol/L (ref 97–108)
Creatinine, Ser: 1.1 mg/dL — ABNORMAL HIGH (ref 0.57–1.00)
GFR calc Af Amer: 60 mL/min/{1.73_m2} (ref >59)
GFR calc non Af Amer: 52 mL/min/{1.73_m2} — ABNORMAL LOW (ref >59)
GLOBULIN, TOTAL: 2.6 g/dL (ref 1.5–4.5)
Glucose: 95 mg/dL (ref 65–99)
Potassium: 4.3 mmol/L (ref 3.5–5.2)
Sodium: 142 mmol/L (ref 134–144)
Total Protein: 6.9 g/dL (ref 6.0–8.5)

## 2014-07-16 LAB — CBC WITH DIFFERENTIAL/PLATELET
BASOS ABS: 0 10*3/uL (ref 0.0–0.2)
Basos: 1 %
EOS (ABSOLUTE): 0.2 10*3/uL (ref 0.0–0.4)
Eos: 4 %
Hematocrit: 37 % (ref 34.0–46.6)
Hemoglobin: 11.9 g/dL (ref 11.1–15.9)
Immature Grans (Abs): 0 10*3/uL (ref 0.0–0.1)
Immature Granulocytes: 0 %
LYMPHS ABS: 1.8 10*3/uL (ref 0.7–3.1)
Lymphs: 44 %
MCH: 28.8 pg (ref 26.6–33.0)
MCHC: 32.2 g/dL (ref 31.5–35.7)
MCV: 90 fL (ref 79–97)
Monocytes Absolute: 0.4 10*3/uL (ref 0.1–0.9)
Monocytes: 11 %
NEUTROS ABS: 1.6 10*3/uL (ref 1.4–7.0)
Neutrophils: 40 %
Platelets: 229 10*3/uL (ref 150–379)
RBC: 4.13 x10E6/uL (ref 3.77–5.28)
RDW: 14.8 % (ref 12.3–15.4)
WBC: 4.1 10*3/uL (ref 3.4–10.8)

## 2014-07-16 LAB — TROPONIN I: Troponin I: <0.03 ng/mL (ref <0.031)

## 2014-07-16 LAB — CK: CK TOTAL: 108 U/L (ref 24–173)

## 2014-07-16 LAB — RPR: RPR: NONREACTIVE

## 2014-07-16 LAB — C-REACTIVE PROTEIN: CRP: 1.8 mg/L (ref 0.0–4.9)

## 2014-07-16 LAB — SEDIMENTATION RATE: SED RATE: 10 mm/h (ref 0–40)

## 2014-07-16 LAB — ANA W/REFLEX IF POSITIVE: Anti Nuclear Antibody(ANA): NEGATIVE

## 2014-07-16 LAB — VITAMIN B12: VITAMIN B 12: 307 pg/mL (ref 211–946)

## 2014-07-16 NOTE — Discharge Instructions (Signed)
Discontinue Cymbalta. Follow up with your primary care provider. Return to the ED as needed if symptoms worsen.  Drug Allergy Allergic reactions to medicines are common. Some allergic reactions are mild. A delayed type of drug allergy that occurs 1 week or more after exposure to a medicine or vaccine is called serum sickness. A life-threatening, sudden (acute) allergic reaction that involves the whole body is called anaphylaxis. CAUSES  "True" drug allergies occur when there is an allergic reaction to a medicine. This is caused by overactivity of the immune system. First, the body becomes sensitized. The immune system is triggered by your first exposure to the medicine. Following this first exposure, future exposure to the same medicine may be life-threatening. Almost any medicine can cause an allergic reaction. Common ones are:  Penicillin.  Sulfonamides (sulfa drugs).  Local anesthetics.  X-ray dyes that contain iodine. SYMPTOMS  Common symptoms of a minor allergic reaction are:  Swelling around the mouth.  An itchy red rash or hives.  Vomiting or diarrhea. Anaphylaxis can cause swelling of the mouth and throat. This makes it difficult to breathe and swallow. Severe reactions can be fatal within seconds, even after exposure to only a trace amount of the drug that causes the reaction. HOME CARE INSTRUCTIONS   If you are unsure of what caused your reaction, keep a diary of foods and medicines used. Include the symptoms that followed. Avoid anything that causes reactions.  You may want to follow up with an allergy specialist after the reaction has cleared in order to be tested to confirm the allergy. It is important to confirm that your reaction is an allergy, not just a side effect to the medicine. If you have a true allergy to a medicine, this may prevent that medicine and related medicines from being given to you when you are very ill.  If you have hives or a rash:  Take medicines  as directed by your caregiver.  You may use an over-the-counter antihistamine (diphenhydramine) as needed.  Apply cold compresses to the skin or take baths in cool water. Avoid hot baths or showers.  If you are severely allergic:  Continuous observation after a severe reaction may be needed. Hospitalization is often required.  Wear a medical alert bracelet or necklace stating your allergy.  You and your family must learn how to use an anaphylaxis kit or give an epinephrine injection to temporarily treat an emergency allergic reaction. If you have had a severe reaction, always carry your epinephrine injection or anaphylaxis kit with you. This can be lifesaving if you have a severe reaction.  Do not drive or perform tasks after treatment until the medicines used to treat your reaction have worn off, or until your caregiver says it is okay. SEEK MEDICAL CARE IF:   You think you had an allergic reaction. Symptoms usually start within 30 minutes after exposure.  Symptoms are getting worse rather than better.  You develop new symptoms.  The symptoms that brought you to your caregiver return. SEEK IMMEDIATE MEDICAL CARE IF:   You have swelling of the mouth, difficulty breathing, or wheezing.  You have a tight feeling in your chest or throat.  You develop hives, swelling, or itching all over your body.  You develop severe vomiting or diarrhea.  You feel faint or pass out. This is an emergency. Use your epinephrine injection or anaphylaxis kit as you have been instructed. Call for emergency medical help. Even if you improve after the injection, you need  to be examined at a hospital emergency department. MAKE SURE YOU:   Understand these instructions.  Will watch your condition.  Will get help right away if you are not doing well or get worse. Document Released: 02/17/2005 Document Revised: 05/12/2011 Document Reviewed: 07/24/2010 [facility] Patient Information 2015 [place], [place].  This information is not intended to replace advice given to you by your health care provider. Make sure you discuss any questions you have with your health care provider.

## 2014-07-17 ENCOUNTER — Telehealth: Payer: Self-pay

## 2014-07-17 NOTE — Telephone Encounter (Signed)
Pt was informed of Creatine levels, she expressed that she had an allergic reaction to Duloxetine that was prescribed recently.  Patient states she had to visit the ER due to throat closing, difficulty breathing, chest pain, and "passing out".

## 2014-07-18 NOTE — Telephone Encounter (Signed)
Reviewed, she should stop Cymbalta, follow-up visit in June 15

## 2014-08-03 ENCOUNTER — Ambulatory Visit
Admission: RE | Admit: 2014-08-03 | Discharge: 2014-08-03 | Disposition: A | Discharge status: Home / Self Care | Payer: Medicare Other | Source: Ambulatory Visit | Attending: Neurology | Admitting: Neurology

## 2014-08-03 DIAGNOSIS — R269 Unspecified abnormalities of gait and mobility: Secondary | ICD-10-CM

## 2014-08-03 DIAGNOSIS — R51 Headache: Secondary | ICD-10-CM

## 2014-08-03 DIAGNOSIS — M48061 Spinal stenosis, lumbar region without neurogenic claudication: Secondary | ICD-10-CM

## 2014-08-03 DIAGNOSIS — R413 Other amnesia: Secondary | ICD-10-CM | POA: Diagnosis not present

## 2014-08-03 DIAGNOSIS — R202 Paresthesia of skin: Secondary | ICD-10-CM | POA: Diagnosis not present

## 2014-08-03 DIAGNOSIS — R519 Headache, unspecified: Secondary | ICD-10-CM

## 2014-08-08 ENCOUNTER — Telehealth: Payer: Self-pay | Admitting: Neurology

## 2014-08-08 NOTE — Telephone Encounter (Signed)
Please call patient MRI of the brain showed mild abnormality, that could be related to her migraine, vascular risk factor, I will review detail with her at her follow-up visit  IMPRESSION: Abnormal MRI scan of brain showing tiny solitary right frontal periventricular white matter hyperintensity with differential discussed above. Incidental findings of chronic paranasal sinusitis and low lying cerebellar tonsils also noted.

## 2014-08-08 NOTE — Telephone Encounter (Signed)
Patient aware of results and she will keep her follow up appt on 08/16/14.

## 2014-08-16 ENCOUNTER — Ambulatory Visit (INDEPENDENT_AMBULATORY_CARE_PROVIDER_SITE_OTHER): Payer: Medicare Other | Admitting: Neurology

## 2014-08-16 ENCOUNTER — Encounter: Payer: Self-pay | Admitting: Neurology

## 2014-08-16 VITALS — BP 145/95 | HR 81 | Ht 61.0 in | Wt 242.5 lb

## 2014-08-16 DIAGNOSIS — M48061 Spinal stenosis, lumbar region without neurogenic claudication: Secondary | ICD-10-CM

## 2014-08-16 DIAGNOSIS — R519 Headache, unspecified: Secondary | ICD-10-CM

## 2014-08-16 DIAGNOSIS — G4733 Obstructive sleep apnea (adult) (pediatric): Secondary | ICD-10-CM | POA: Diagnosis not present

## 2014-08-16 DIAGNOSIS — M4806 Spinal stenosis, lumbar region: Secondary | ICD-10-CM

## 2014-08-16 DIAGNOSIS — R269 Unspecified abnormalities of gait and mobility: Secondary | ICD-10-CM | POA: Diagnosis not present

## 2014-08-16 DIAGNOSIS — R202 Paresthesia of skin: Secondary | ICD-10-CM

## 2014-08-16 DIAGNOSIS — R413 Other amnesia: Secondary | ICD-10-CM

## 2014-08-16 DIAGNOSIS — R51 Headache: Secondary | ICD-10-CM | POA: Diagnosis not present

## 2014-08-16 MED ORDER — NORTRIPTYLINE HCL 10 MG PO CAPS: ORAL_CAPSULE | ORAL | Status: DC

## 2014-08-16 NOTE — Progress Notes (Signed)
Chief Complaint  Patient presents with  . Migraine    She is here to discuss the results of her MRI. She had an allergic reaction to duloxetine that required her to visit the ED for difficulty breathing, throat closing and chest pain.      PATIENT: Sandra Brennan DOB: 10-Aug-1945  Chief Complaint  Patient presents with  . Migraine    She is here to discuss the results of her MRI. She had an allergic reaction to duloxetine that required her to visit the ED for difficulty breathing, throat closing and chest pain.    HISTORICAL (initial visit Jul 14 2014)  Sandra Brennan is a 69 year old right-handed female, she is referred by her primary care physician Dr. Vincente Liberty for evaluation of headaches  She has history of HTN, DM, obesity, multiple joint disease, status post left hip replacement, bilateral knee replacement, she had a PhD degree, used to work as a Oncologist, went on disability due to her chronic low back pain, multiple joint disease, lives with her mother, is the main caregiver of her mother  She reported history of migraine around age 59s,  holoacranial severe pounding headaches with associated light noise sensitivity, which has much improved over the years  Since 2015, she began to suffer different kind of headaches, multiple episodes in the day, transient sharp sticky pain starting her left vertex region, spreading to left temporal region, sometimes radiating to her left neck, lasting few seconds, to 5-10 minutes, she denies visual loss.  She denied chewing difficulty, but diffuse muscle achiness, and bilateral temporal area muscle weakness upon deep palpitation.   She complains of gradual onset memory loss since 2015, word finding difficulties, she was able to manage her household bills without difficulty, never had drive license.  She complains of trouble swallowing, using water to wash down her food, acid reflux, failed to improved by Prilosec in the past,  she denies trouble chewing.  UPDATE June 15th 2016: We have reviewed MRI scan of brain in August 03 2014, tiny solitary right frontal periventricular white matter hyperintensity with differential discussed above. Incidental findings of chronic paranasal sinusitis and low lying cerebellar tonsils also noted.  Laboratory showed normal B12 307,C-reactive protein, CPK, CMP, with exception of mild elevate creat 1.32. CBC, negative RPR.  She was started on Cymbalta, presented to emergency room in Jul 15 2014 few hours after she started taking Cymbalta, as if she is going to pass out,difficulty breathing.  She continues to complains of headache, shooting pain through her head, transient,mostly on left temporal region, vertex area,   She also complains of chronic low back pain, bilateral feet paresthesia, had orthopedic evaluation, per patient, not a surgical candidate, evidence of lumbar degenerative disc disease.  She had a history of obstructive sleep apnea, was given CPAP machine, with nighttime oxygen, but she has difficulty complying with the machine, has not using it for many years, complains of poor sleep quality, snoring, frequent awakening, excessive daytime sleepiness, and fatigue.  REVIEW OF SYSTEMS: Full 14 system review of systems performed and notable only for activity change, appetite change, ear pain, hearing loss, ringing ears, eye itching, shortness of breath, leg swelling, cold intolerance, food allergy, frequent urination, neck pain, neck pain, headaches, anemia, frequent awakening, shift work.   ALLERGIES: Allergies  Allergen Reactions  . Cymbalta [Duloxetine Hcl] Anaphylaxis  . Penicillins Hives  . Sulfa Antibiotics Other (See Comments)    REACTION: unknown  . [place]  MEDICATIONS: Current Outpatient Prescriptions  Medication Sig Dispense Refill  . albuterol (PROVENTIL HFA;VENTOLIN HFA) 108 (90 BASE) MCG/ACT inhaler Inhale 2 puffs into the lungs every 6  (six) hours as needed for wheezing.    Marland Kitchen aspirin EC 81 MG tablet Take 81 mg by mouth daily.    . colchicine 0.6 MG tablet Take 0.6 mg by mouth 2 (two) times daily.    . cycloSPORINE (RESTASIS) 0.05 % ophthalmic emulsion Place 1 drop into both eyes 2 (two) times daily.    Marland Kitchen gabapentin (NEURONTIN) 600 MG tablet     . HYDROcodone-acetaminophen (NORCO) 7.5-325 MG per tablet Take 1 tablet by mouth every 6 (six) hours as needed. for pain  0  . metFORMIN (GLUCOPHAGE-XR) 500 MG 24 hr tablet Take 500 mg by mouth every morning.  4  . potassium chloride SA (K-DUR,KLOR-CON) 20 MEQ tablet Take 20 mEq by mouth 2 (two) times daily.    Marland Kitchen triamterene-hydrochlorothiazide (MAXZIDE-25) 37.5-25 MG per tablet Take 1 tablet by mouth daily.    . Vitamin D, Ergocalciferol, (DRISDOL) 50000 UNITS CAPS capsule Take 50,000 Units by mouth once a week.  4   No current facility-administered medications for this visit.    PAST MEDICAL HISTORY: Past Medical History  Diagnosis Date  . Hypertension   . Asthma   . Shortness of breath     when stressed.  . Myocardial infarction 1992    August 24, 2012 "mild MI when son died "  . GERD (gastroesophageal reflux disease)   . Sleep apnea     "never did fix my machine; haven't used one for 5 years; I've lost 116# since then; no problems now" (24-Aug-2012)  . Type II diabetes mellitus     "borderline; don't test; take Metformin" (08-24-2012)  . Anemia   . Migraines     "used to; totally stopped when I quit drinking 30 yr ago" (08/24/12)  . Arthritis     "plenty" (August 24, 2012)  . Degenerative arthritis     "all over" (August 24, 2012)  . Osteoporosis     "all over" (08/24/12)  . Chronic lower back pain     "they say I need a whole new spinal column" (08/24/12)  . Gout 05/05/2013  . Myalgia and myositis   . Obesity   . COPD (chronic obstructive pulmonary disease)     PAST SURGICAL HISTORY: Past Surgical History  Procedure Laterality Date  . Ganglion cyst excision Right 1980's    "wrist"  (2012/08/24)  . Knee ligament reconstruction Right 1980's  . Total hip arthroplasty Left 08-24-2012  . Tonsillectomy  ~ 1954  . Total knee arthroplasty Left 2001  . Total knee arthroplasty Right 2004  . Joint replacement    . Abdominal hysterectomy  1990's  . Total hip arthroplasty Left August 24, 2012    Procedure: TOTAL HIP ARTHROPLASTY;  Surgeon: Garald Balding, MD;  Location: [facility];  Service: Orthopedics;  Laterality: Left;  Left Total Hip Arthroplasty  . Total hip arthroplasty Left 08/28/2012    Procedure: Irrigation and Debridement hip ;  Surgeon: Mcarthur Rossetti, MD;  Location: [facility];  Service: Orthopedics;  Laterality: Left;    FAMILY HISTORY: Family History  Problem Relation Age of Onset  . Arthritis Mother   . Arthritis Father   . Diabetes Sister   . Arthritis Sister   . Diabetes Maternal Grandmother   . Arthritis Maternal Grandmother   . Diabetes Maternal Grandfather   . Arthritis Maternal Grandfather   . Colon cancer Father  SOCIAL HISTORY:  History   Social History  . Marital Status: Widowed    Spouse Name: N/A  . Number of Children: 5  . Years of Education: PhD   Occupational History  . Retired Pharmacist, hospital    Social History Main Topics  . Smoking status: Former Smoker -- 0.12 packs/day for 2 years    Types: Cigarettes  . Smokeless tobacco: Never Used     Comment: 08/03/2012 "quit 30 years ago"  . Alcohol Use: No     Comment: 08/03/2012 "used to be a beeralcoholic"; stopped ~ 30 yr ago"  . Drug Use: No  . Sexual Activity: No   Other Topics Concern  . Not on file   Social History Narrative   Lives at home with her mother and caregiver.   Occasional use of caffeine.   Right-handed.    PHYSICAL EXAM   Filed Vitals:   08/16/14 1145  BP: 145/95  Pulse: 81  Height: 5\' 1"  (1.549 m)  Weight: 242 lb 8 oz (109.997 kg)    Not recorded      Body mass index is 45.84 kg/(m^2).  PHYSICAL EXAMNIATION:  Gen: NAD, conversant, well nourised, obese, well  groomed                     Cardiovascular: Regular rate rhythm, no peripheral edema, warm, nontender. Eyes: Conjunctivae clear without exudates or hemorrhage Neck: Supple, no carotid bruise. Pulmonary: Clear to auscultation bilaterally   NEUROLOGICAL EXAM:  MENTAL STATUS: Speech:    Speech is normal; fluent and spontaneous with normal comprehension.  Cognition:    The patient is oriented to person, place, and time;     recent and remote memory intact;     language fluent;     normal attention, concentration,     fund of knowledge.  CRANIAL NERVES: CN II: Visual fields are full to confrontation. Fundoscopic exam is normal with sharp discs and no vascular changes. Pupil equal round reactive to light. CN III, IV, VI: extraocular movement are normal. No ptosis. CN V: Facial sensation is intact to pinprick in all 3 divisions bilaterally. Corneal responses are intact.  CN VII: Face is symmetric with normal eye closure and smile. CN VIII: Hearing is normal to rubbing fingers CN IX, X: Palate elevates symmetrically. Phonation is normal. CN XI: Head turning and shoulder shrug are intact CN XII: Tongue is midline with normal movements and no atrophy.  MOTOR: There is no pronator drift of out-stretched arms. Muscle bulk and tone are normal. Muscle strength is normal. Wear right ankle brace   Shoulder abduction Shoulder external rotation Elbow flexion Elbow extension Wrist flexion Wrist extension Finger abduction Hip flexion Knee flexion Knee extension Ankle dorsi flexion Ankle plantar flexion  R 5 5 5 5 5 5 5 5 5 5 5 5   L 5 5 5 5 5 5 5 5 5 5 5 5     REFLEXES: Reflexes are 1 and symmetric at the biceps, triceps, knees, and absent ankles. Plantar responses are flexor.  SENSORY: Length dependent decreased, pinprick, position sense, and vibration sense to ankle level  COORDINATION: Rapid alternating movements and fine finger movements are intact. There is no dysmetria on finger-to-nose,   heel-knee-shin, limited due to knee pain GAIT/STANCE: Need to push up from seated position, antalgic, dragging her right leg, unsteady   DIAGNOSTIC DATA (LABS, IMAGING, TESTING) - I reviewed patient records, labs, notes, testing and imaging myself where available.  Lab Results  Component Value  Date   WBC 4.8 07/15/2014   HGB 11.6* 07/15/2014   HCT 36.4 07/15/2014   MCV 89.2 07/15/2014   PLT 198 07/15/2014      Component Value Date/Time   NA 139 07/15/2014 2208   NA 142 07/14/2014 1014   K 3.7 07/15/2014 2208   CL 103 07/15/2014 2208   CO2 23 07/15/2014 2208   GLUCOSE 109* 07/15/2014 2208   GLUCOSE 95 07/14/2014 1014   BUN 12 07/15/2014 2208   BUN 12 07/14/2014 1014   CREATININE 1.32* 07/15/2014 2208   CALCIUM 9.3 07/15/2014 2208   PROT 6.7 07/15/2014 2208   PROT 6.9 07/14/2014 1014   ALBUMIN 3.5 07/15/2014 2208   AST 36 07/15/2014 2208   ALT 25 07/15/2014 2208   ALKPHOS 82 07/15/2014 2208   BILITOT 0.4 07/15/2014 2208   BILITOT 0.2 07/14/2014 1014   GFRNONAA 41* 07/15/2014 2208   GFRAA 47* 07/15/2014 2208   No results found for: CHOL, HDL, LDLCALC, LDLDIRECT, TRIG, CHOLHDL Lab Results  Component Value Date   HGBA1C 5.8* 08/03/2012      ASSESSMENT AND PLAN  CADDIE RANDLE is a 69 y.o. female  with past medical history of hypertension, diabetes, obesity, multiple degenerative joint disease, history of migraine, now presenting with daily transient ice pricking headaches since 2015   1. Headaches, history of migraine, her headache has some migraine features, also likely related to her obstructive sleep apnea, will try low dose nortriptyline, titrating to 10 mg 2 tablets every night. 2. Obstructive sleep apnea, she has not been able to comply with the CPAP machine, referred to sleep specialist,  3. Obesity: Encourage her moderate exercise, losing weight, 4  Diabetic peripheral neuropathy,  feet paresthesia, continue gabapentin,  Marcial Pacas, M.D. Ph.D.  [facility] [address] [place], [place] [address] Ph: [phone] Fax: [phone]

## 2014-08-22 ENCOUNTER — Encounter: Payer: Self-pay | Admitting: *Deleted

## 2014-09-26 DIAGNOSIS — I119 Hypertensive heart disease without heart failure: Secondary | ICD-10-CM | POA: Diagnosis not present

## 2014-09-26 DIAGNOSIS — K210 Gastro-esophageal reflux disease with esophagitis: Secondary | ICD-10-CM | POA: Diagnosis not present

## 2014-09-26 DIAGNOSIS — Z79899 Other long term (current) drug therapy: Secondary | ICD-10-CM | POA: Diagnosis not present

## 2014-09-26 DIAGNOSIS — Z79891 Long term (current) use of opiate analgesic: Secondary | ICD-10-CM | POA: Diagnosis not present

## 2014-09-26 DIAGNOSIS — B350 Tinea barbae and tinea capitis: Secondary | ICD-10-CM | POA: Diagnosis not present

## 2014-09-26 DIAGNOSIS — E785 Hyperlipidemia, unspecified: Secondary | ICD-10-CM | POA: Diagnosis not present

## 2014-09-26 DIAGNOSIS — E784 Other hyperlipidemia: Secondary | ICD-10-CM | POA: Diagnosis not present

## 2014-09-26 DIAGNOSIS — M2550 Pain in unspecified joint: Secondary | ICD-10-CM | POA: Diagnosis not present

## 2014-09-26 DIAGNOSIS — G43909 Migraine, unspecified, not intractable, without status migrainosus: Secondary | ICD-10-CM | POA: Diagnosis not present

## 2014-09-26 DIAGNOSIS — E669 Obesity, unspecified: Secondary | ICD-10-CM | POA: Diagnosis not present

## 2014-09-26 DIAGNOSIS — E559 Vitamin D deficiency, unspecified: Secondary | ICD-10-CM | POA: Diagnosis not present

## 2014-09-26 DIAGNOSIS — E1140 Type 2 diabetes mellitus with diabetic neuropathy, unspecified: Secondary | ICD-10-CM | POA: Diagnosis not present

## 2014-09-27 DIAGNOSIS — Z79891 Long term (current) use of opiate analgesic: Secondary | ICD-10-CM | POA: Diagnosis not present

## 2014-10-24 ENCOUNTER — Ambulatory Visit (INDEPENDENT_AMBULATORY_CARE_PROVIDER_SITE_OTHER): Payer: Medicare Other | Admitting: Neurology

## 2014-10-24 ENCOUNTER — Encounter: Payer: Self-pay | Admitting: Neurology

## 2014-10-24 VITALS — BP 138/88 | HR 80 | Resp 16 | Ht 61.0 in | Wt 240.0 lb

## 2014-10-24 DIAGNOSIS — R351 Nocturia: Secondary | ICD-10-CM | POA: Diagnosis not present

## 2014-10-24 DIAGNOSIS — G4719 Other hypersomnia: Secondary | ICD-10-CM

## 2014-10-24 DIAGNOSIS — G4733 Obstructive sleep apnea (adult) (pediatric): Secondary | ICD-10-CM

## 2014-10-24 NOTE — Progress Notes (Signed)
Subjective:    Patient ID: Sandra Brennan is a 69 y.o. female.  HPI     Star Age, MD, PhD [facility] [address]O. [place], [place] [address]  Dear Aliene Beams,   I saw your patient, Sandra Brennan, upon your kind request in my clinic today for initial consultation of her sleep disorder, in particular, concern for underlying obstructive sleep apnea. The patient is unaccompanied today. As you know, Sandra Brennan is a 69 year old right-handed woman with an underlying medical history of hypertension, asthma, shortness of breath, coronary artery disease, status post MI, reflux disease, type 2 diabetes, anemia, migraine headaches, arthritis, osteoporosis, gout, COPD, obesity, lumbar spinal stenosis, status post left hip replacement surgery 2 years ago, status post left knee replacement in the 1980s and status post right total knee replacement in 2001, who carries a prior diagnosis of obstructive sleep apnea. She is no longer on CPAP treatment. I reviewed your office note from 08/16/2014. For headaches you started her on nortriptyline. Prior sleep test results are not available for my review. She still snores. She does not wake up rested. Her Epworth sleepiness score is 11 out of 24, fatigue score is 48 out of 63 today. She had a difficult time tolerating CPAP in the past. She reports that she was much heavier (330 lb) at the time of her original sleep apnea diagnosis. She had a sleep study several years ago and has not used CPAP in about 6 years. She had CPAP with oxygen in the past. She was able to lose weight. She was down to 190 pounds but unfortunately gained weight back. She is currently at 240 pounds. She takes care of her 63 year old mother. 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 She drinks iced tea, one cup a day and has cut back on her sodas quite a bit she states. She quit smoking years ago. She would be willing to be retested for sleep  apnea and consider CPAP treatment again if the need arises. She denies any frank restless leg syndrome type symptoms. She's not sure if she twitches in her sleep. Bedtime is around 9 PM and rise time is around 8 to 8:30. Despite a fairly long sleep time she does not wake up rested. She has nocturia typically 2-3 times per night. She denies morning headaches. Migraines have improved since she was started on nortriptyline. She currently works 10 hours a week for the [employer]. She had an EMG and nerve conduction study in April and requested test results for those. I went over the results with her today.   Her Past Medical History Is Significant For: Past Medical History  Diagnosis Date  . Hypertension   . Asthma   . Shortness of breath     when stressed.  . Myocardial infarction 1992    2012-08-04 "mild MI when son died "  . GERD (gastroesophageal reflux disease)   . Sleep apnea     "never did fix my machine; haven't used one for 5 years; I've lost 116# since then; no problems now" (04-Aug-2012)  . Type II diabetes mellitus     "borderline; don't test; take Metformin" (04-Aug-2012)  . Anemia   . Migraines     "used to; totally stopped when I quit drinking 30 yr ago" (August 04, 2012)  . Arthritis     "plenty" (2012/08/04)  . Degenerative arthritis     "all over" (August 04, 2012)  . Osteoporosis     "  all over" (08/03/2012)  . Chronic lower back pain     "they say I need a whole new spinal column" (08/03/2012)  . Gout 05/05/2013  . Myalgia and myositis   . Obesity   . COPD (chronic obstructive pulmonary disease)     Her Past Surgical History Is Significant For: Past Surgical History  Procedure Laterality Date  . Ganglion cyst excision Right 1980's    "wrist" (08/03/2012)  . Knee ligament reconstruction Right 1980's  . Total hip arthroplasty Left 08/03/2012  . Tonsillectomy  ~ 1954  . Total knee arthroplasty Left 2001  . Total knee arthroplasty Right 2004  . Joint replacement    . Abdominal  hysterectomy  1990's  . Total hip arthroplasty Left 08/03/2012    Procedure: TOTAL HIP ARTHROPLASTY;  Surgeon: Garald Balding, MD;  Location: [facility];  Service: Orthopedics;  Laterality: Left;  Left Total Hip Arthroplasty  . Total hip arthroplasty Left 08/28/2012    Procedure: Irrigation and Debridement hip ;  Surgeon: Mcarthur Rossetti, MD;  Location: [facility];  Service: Orthopedics;  Laterality: Left;    Her Family History Is Significant For: Family History  Problem Relation Age of Onset  . Arthritis Mother   . Arthritis Father   . Diabetes Sister   . Arthritis Sister   . Diabetes Maternal Grandmother   . Arthritis Maternal Grandmother   . Diabetes Maternal Grandfather   . Arthritis Maternal Grandfather   . Colon cancer Father     Her Social History Is Significant For: Social History   Social History  . Marital Status: Widowed    Spouse Name: N/A  . Number of Children: 5  . Years of Education: PhD   Occupational History  . Retired    Social History Main Topics  . Smoking status: Former Smoker -- 0.12 packs/day for 2 years    Types: Cigarettes  . Smokeless tobacco: Never Used     Comment: 08/03/2012 "quit 30 years ago"  . Alcohol Use: No     Comment: 08/03/2012 "used to be a beeralcoholic"; stopped ~ 30 yr ago"  . Drug Use: No  . Sexual Activity: No   Other Topics Concern  . None   Social History Narrative   Lives at home with her mother and caregiver.   Occasional use of caffeine.   Right-handed.    Her Allergies Are:  Allergies  Allergen Reactions  . Cymbalta [Duloxetine Hcl] Anaphylaxis  . Penicillins Hives  . Sulfa Antibiotics Other (See Comments)    REACTION: unknown  . Theophyllines Hives  :   Her Current Medications Are:  Outpatient Encounter Prescriptions as of 10/24/2014  Medication Sig  . albuterol (PROVENTIL HFA;VENTOLIN HFA) 108 (90 BASE) MCG/ACT inhaler Inhale 2 puffs into the lungs every 6 (six) hours as needed for wheezing.  Marland Kitchen aspirin EC 81  MG tablet Take 81 mg by mouth daily.  . colchicine 0.6 MG tablet Take 0.6 mg by mouth 2 (two) times daily.  . cycloSPORINE (RESTASIS) 0.05 % ophthalmic emulsion Place 1 drop into both eyes 2 (two) times daily.  . diclofenac (VOLTAREN) 50 MG EC tablet Take 50 mg by mouth 2 (two) times daily.  Marland Kitchen gabapentin (NEURONTIN) 600 MG tablet   . HYDROcodone-acetaminophen (NORCO) 7.5-325 MG per tablet Take 1 tablet by mouth every 6 (six) hours as needed. for pain  . metFORMIN (GLUCOPHAGE-XR) 500 MG 24 hr tablet Take 500 mg by mouth every morning.  . nortriptyline (PAMELOR) 10 MG capsule One  po qhs xone week, then 2 po qhs  . potassium chloride SA (K-DUR,KLOR-CON) 20 MEQ tablet Take 20 mEq by mouth 2 (two) times daily.  Marland Kitchen triamterene-hydrochlorothiazide (MAXZIDE-25) 37.5-25 MG per tablet Take 1 tablet by mouth daily.  . Vitamin D, Ergocalciferol, (DRISDOL) 50000 UNITS CAPS capsule Take 50,000 Units by mouth once a week.   No facility-administered encounter medications on file as of 10/24/2014.  :  Review of Systems:  Out of a complete 14 point review of systems, all are reviewed and negative with the exception of these symptoms as listed below:   Review of Systems  Constitutional: Positive for diaphoresis and fatigue.       Appetite change   HENT: Positive for ear pain, tinnitus and trouble swallowing.   Eyes: Positive for itching.       Blurred vision   Cardiovascular: Positive for leg swelling.       Chest tightness  Musculoskeletal:       Back pain, aching muscles, muscle cramps  Allergic/Immunologic: Positive for food allergies.  Neurological: Positive for headaches.       H/O CPAP use with O2, have not used in 6 years. Memory loss, no trouble falling and staying asleep, daytime sleepiness, snoring, headaches prior to medication treatment, wakes up feeling tired. Takes naps during the day. Patient reports she will usually fall asleep around 1pm.   Hematological: Bruises/bleeds easily.     Objective:  Neurologic Exam  Physical Exam Physical Examination:   Filed Vitals:   10/24/14 1044  BP: 138/88  Pulse: 80  Resp: 16   General Examination: The patient is a very pleasant 69 y.o. female in no acute distress. She appears well-developed and well-nourished and adequately groomed.   HEENT: Normocephalic, atraumatic, pupils are equal, round and reactive to light and accommodation. Funduscopic exam is normal with sharp disc margins noted. Extraocular tracking is good without limitation to gaze excursion or nystagmus noted. Normal smooth pursuit is noted. Hearing is grossly intact. Tympanic membranes are clear bilaterally. Face is symmetric with normal facial animation and normal facial sensation. Speech is clear with no dysarthria noted. There is no hypophonia. There is no lip, neck/head, jaw or voice tremor. Neck is supple with full range of passive and active motion. There are no carotid bruits on auscultation. Oropharynx exam reveals: moderate mouth dryness, adequate dental hygiene and moderate airway crowding, due to wider tongue and redundant soft palate. Mallampati is class II. Tongue protrudes centrally and palate elevates symmetrically. Tonsils are absent. Neck size is 14.5 inches. She has a mild overbite. Nasal inspection reveals no significant nasal mucosal bogginess or redness and no septal deviation.   Chest: Clear to auscultation without wheezing, rhonchi or crackles noted.  Heart: S1+S2+0, regular and normal without murmurs, rubs or gallops noted.   Abdomen: Soft, non-tender and non-distended with normal bowel sounds appreciated on auscultation.  Extremities: There is trace pitting edema in the distal lower extremities bilaterally. Pedal pulses are intact.  Skin: Warm and dry without trophic changes noted. There are no varicose veins.  Musculoskeletal: exam reveals: 2 large scars on the Right knee and one on the L, wears ankle brace on the right.    Neurologically:  Mental status: The patient is awake, alert and oriented in all 4 spheres. Her immediate and remote memory, attention, language skills and fund of knowledge are appropriate. There is no evidence of aphasia, agnosia, apraxia or anomia. Speech is clear with normal prosody and enunciation. Thought process is linear. Mood is  normal and affect is normal.  Cranial nerves II - XII are as described above under HEENT exam. In addition: shoulder shrug is normal with equal shoulder height noted. Motor exam: Normal bulk, strength and tone is noted. There is no drift, tremor or rebound. Romberg is negative. Reflexes are 1+ in the UEs and diminished in the LEs. Fine motor skills and coordination: intact.  Cerebellar testing: No dysmetria or intention tremor on finger to nose testing. Heel to shin is very difficult for her secondary to body habitus and also joint replacement surgeries.  Sensory exam: intact to light touch in the UEs and LEs.  Gait, station and balance: She stands with difficulty. No veering to one side is noted. No leaning to one side is noted. Posture is age-appropriate and stance is wide based. Gait is cautious. She has a single point cane. She is not able to do tandem walk.               Assessment and Plan:   In summary, VERNELLE WISNER is a very pleasant 69 y.o.-year old female with an underlying complex medical history of hypertension, asthma, shortness of breath, coronary artery disease, status post MI, reflux disease, type 2 diabetes, anemia, migraine headaches, arthritis, osteoporosis, gout, COPD, obesity, neuropathy, lumbar spinal stenosis, status post left hip replacement surgery 2 years ago, status post left knee replacement in the 1980s and status post right total knee replacement in 2001, who carries a prior diagnosis of obstructive sleep apnea. Her history and physical exam in keeping with obstructive sleep apnea (OSA). She had an EMG and nerve conduction test in April  2016 and I went over her test results with her as she states that results were not given to her before. I explained the difference between radiculopathy and peripheral neuropathy.  I had a long chat with the patient about my findings and the diagnosis of OSA, its prognosis and treatment options. We talked about medical treatments, surgical interventions and non-pharmacological approaches. I explained in particular the risks and ramifications of untreated moderate to severe OSA, especially with respect to developing cardiovascular disease down the Road, including congestive heart failure, difficult to treat hypertension, cardiac arrhythmias, or stroke. Even type 2 diabetes has, in part, been linked to untreated OSA. Symptoms of untreated OSA include daytime sleepiness, memory problems, mood irritability and mood disorder such as depression and anxiety, lack of energy, as well as recurrent headaches, especially morning headaches. We talked about smoking cessation and trying to maintain a healthy lifestyle in general, as well as the importance of weight control. I encouraged the patient to eat healthy, exercise daily and keep well hydrated, to keep a scheduled bedtime and wake time routine, to not skip any meals and eat healthy snacks in between meals. I advised the patient not to drive when feeling sleepy. I recommended the following at this time: sleep study with potential positive airway pressure titration. (We will score hypopneas at 4% and split the sleep study into diagnostic and treatment portion, if the estimated. 2 hour AHI is >15/h).   I explained the sleep test procedure to the patient and also outlined possible surgical and non-surgical treatment options of OSA, including the use of a custom-made dental device (which would require a referral to a specialist dentist or oral surgeon), upper airway surgical options, such as pillar implants, radiofrequency surgery, tongue base surgery, and UPPP (which would  involve a referral to an ENT surgeon). Rarely, jaw surgery such as mandibular advancement may  be considered.  I also explained the CPAP treatment option to the patient, who indicated that she would be willing to try CPAP again if the need arises. I explained the importance of being compliant with PAP treatment, not only for insurance purposes but primarily to improve Her symptoms, and for the patient's long term health benefit, including to reduce Her cardiovascular risks. I answered all her questions today and the patient was in agreement. I would like to see her back after the sleep study is completed and encouraged her to call with any interim questions, concerns, problems or updates.   Thank you very much for allowing me to participate in the care of this nice patient. If I can be of any further assistance to you please do not hesitate to talk to me.   Sincerely,   Star Age, MD, PhD

## 2014-10-24 NOTE — Patient Instructions (Signed)

## 2014-11-02 DIAGNOSIS — G5602 Carpal tunnel syndrome, left upper limb: Secondary | ICD-10-CM | POA: Diagnosis not present

## 2014-11-02 DIAGNOSIS — M25551 Pain in right hip: Secondary | ICD-10-CM | POA: Diagnosis not present

## 2014-11-14 DIAGNOSIS — H2513 Age-related nuclear cataract, bilateral: Secondary | ICD-10-CM | POA: Diagnosis not present

## 2014-11-14 DIAGNOSIS — M5416 Radiculopathy, lumbar region: Secondary | ICD-10-CM | POA: Diagnosis not present

## 2014-11-14 DIAGNOSIS — G453 Amaurosis fugax: Secondary | ICD-10-CM | POA: Diagnosis not present

## 2014-11-14 DIAGNOSIS — H16223 Keratoconjunctivitis sicca, not specified as Sjogren's, bilateral: Secondary | ICD-10-CM | POA: Diagnosis not present

## 2014-11-14 DIAGNOSIS — M4806 Spinal stenosis, lumbar region: Secondary | ICD-10-CM | POA: Diagnosis not present

## 2014-11-15 DIAGNOSIS — G453 Amaurosis fugax: Secondary | ICD-10-CM | POA: Diagnosis not present

## 2014-11-15 DIAGNOSIS — H16223 Keratoconjunctivitis sicca, not specified as Sjogren's, bilateral: Secondary | ICD-10-CM | POA: Diagnosis not present

## 2014-11-15 DIAGNOSIS — H2513 Age-related nuclear cataract, bilateral: Secondary | ICD-10-CM | POA: Diagnosis not present

## 2014-11-27 ENCOUNTER — Ambulatory Visit (INDEPENDENT_AMBULATORY_CARE_PROVIDER_SITE_OTHER): Payer: Medicare Other | Admitting: Neurology

## 2014-11-27 DIAGNOSIS — G4733 Obstructive sleep apnea (adult) (pediatric): Secondary | ICD-10-CM | POA: Diagnosis not present

## 2014-11-27 DIAGNOSIS — G4720 Circadian rhythm sleep disorder, unspecified type: Secondary | ICD-10-CM

## 2014-11-27 DIAGNOSIS — G4734 Idiopathic sleep related nonobstructive alveolar hypoventilation: Secondary | ICD-10-CM

## 2014-11-27 NOTE — Sleep Study (Signed)
Please see the scanned sleep study interpretation located in the Procedure tab within the Chart Review section. 

## 2014-11-29 ENCOUNTER — Telehealth: Payer: Self-pay | Admitting: Neurology

## 2014-11-29 DIAGNOSIS — G4733 Obstructive sleep apnea (adult) (pediatric): Secondary | ICD-10-CM

## 2014-11-29 DIAGNOSIS — G4734 Idiopathic sleep related nonobstructive alveolar hypoventilation: Secondary | ICD-10-CM

## 2014-11-29 NOTE — Telephone Encounter (Signed)
Dr. Rhea Belton patient, seen by me on 10/24/14, PSG on 11/27/14, ins: medicare, PCP: Dr. Katherine Roan. Please call and notify the patient that the recent sleep study did confirm the diagnosis of obstructive sleep apnea and that I recommend treatment for this in the form of CPAP. This will require a repeat sleep study for proper titration and mask fitting. Please explain to patient and arrange for a CPAP titration study. I have placed an order in the chart. Thanks, and please route to [facility] next for scheduling.   Star Age, MD, PhD [facility])

## 2014-11-29 NOTE — Telephone Encounter (Signed)
I spoke to patient and she states that she is scheduled for 10/13 to do titration study. She is aware of results and is willing to proceed.

## 2014-11-30 ENCOUNTER — Other Ambulatory Visit: Payer: Self-pay | Admitting: *Deleted

## 2014-11-30 DIAGNOSIS — G43709 Chronic migraine without aura, not intractable, without status migrainosus: Secondary | ICD-10-CM

## 2014-11-30 MED ORDER — NORTRIPTYLINE HCL 10 MG PO CAPS: ORAL_CAPSULE | ORAL | Status: DC

## 2014-12-12 DIAGNOSIS — Z0001 Encounter for general adult medical examination with abnormal findings: Secondary | ICD-10-CM | POA: Diagnosis not present

## 2014-12-12 DIAGNOSIS — E669 Obesity, unspecified: Secondary | ICD-10-CM | POA: Diagnosis not present

## 2014-12-12 DIAGNOSIS — I119 Hypertensive heart disease without heart failure: Secondary | ICD-10-CM | POA: Diagnosis not present

## 2014-12-12 DIAGNOSIS — M2550 Pain in unspecified joint: Secondary | ICD-10-CM | POA: Diagnosis not present

## 2014-12-12 DIAGNOSIS — E559 Vitamin D deficiency, unspecified: Secondary | ICD-10-CM | POA: Diagnosis not present

## 2014-12-12 DIAGNOSIS — K210 Gastro-esophageal reflux disease with esophagitis: Secondary | ICD-10-CM | POA: Diagnosis not present

## 2014-12-12 DIAGNOSIS — Z79891 Long term (current) use of opiate analgesic: Secondary | ICD-10-CM | POA: Diagnosis not present

## 2014-12-12 DIAGNOSIS — M159 Polyosteoarthritis, unspecified: Secondary | ICD-10-CM | POA: Diagnosis not present

## 2014-12-12 DIAGNOSIS — Z96642 Presence of left artificial hip joint: Secondary | ICD-10-CM | POA: Diagnosis not present

## 2014-12-12 DIAGNOSIS — E1140 Type 2 diabetes mellitus with diabetic neuropathy, unspecified: Secondary | ICD-10-CM | POA: Diagnosis not present

## 2014-12-12 DIAGNOSIS — G43909 Migraine, unspecified, not intractable, without status migrainosus: Secondary | ICD-10-CM | POA: Diagnosis not present

## 2014-12-12 DIAGNOSIS — J029 Acute pharyngitis, unspecified: Secondary | ICD-10-CM | POA: Diagnosis not present

## 2014-12-14 ENCOUNTER — Ambulatory Visit (INDEPENDENT_AMBULATORY_CARE_PROVIDER_SITE_OTHER): Payer: Medicare Other | Admitting: Neurology

## 2014-12-14 DIAGNOSIS — G4733 Obstructive sleep apnea (adult) (pediatric): Secondary | ICD-10-CM | POA: Diagnosis not present

## 2014-12-15 NOTE — Sleep Study (Signed)
Please see the scanned sleep study interpretation located in the procedure tab in the chart view section.  

## 2014-12-20 ENCOUNTER — Telehealth: Payer: Self-pay | Admitting: Neurology

## 2014-12-20 DIAGNOSIS — G4733 Obstructive sleep apnea (adult) (pediatric): Secondary | ICD-10-CM

## 2014-12-20 NOTE — Telephone Encounter (Signed)
Dr. Rhea Belton patient, seen by me on 10/24/14, PSG on 11/27/14, CPAP study on 12/14/14, ins: medicare, PCP: Dr. Katherine Roan. Please call and inform patient that I have entered an order for treatment with positive airway pressure (PAP) treatment of obstructive sleep apnea (OSA). She did well during the latest sleep study with CPAP. We will, therefore, arrange for a machine for home use through a DME (durable medical equipment) company of Her choice; and I will see the patient back in follow-up in about 8-10 weeks. Please also explain to the patient that I will be looking out for compliance data, which can be downloaded from the machine (stored on an SD card, that is inserted in the machine) or via remote access through a modem, that is built into the machine. At the time of the followup appointment we will discuss sleep study results and how it is going with PAP treatment at home. Please advise patient to bring Her machine at the time of the first FU visit, even though this is cumbersome. Bringing the machine for every visit after that will likely not be needed, but often helps for the first visit to troubleshoot if needed. Please re-enforce the importance of compliance with treatment and the need for [place] to monitor compliance data - often an insurance requirement and actually good feedback for the patient as far as how they are doing.  Also remind patient, that any interim PAP machine or mask issues should be first addressed with the DME company, as they can often help better with technical and mask fit issues. Please ask if patient has a preference regarding DME company.  Please also make sure, the patient has a follow-up appointment with me in about 8-10 weeks from the setup date, thanks.  Once you have spoken to the patient - and faxed/routed report to PCP and referring MD (if other than PCP), you can close this encounter, thanks,   Star Age, MD, PhD [facility] ([place])

## 2014-12-20 NOTE — Telephone Encounter (Signed)
I spoke to patient and gave results of titration study. She is willing to proceed with treatment. I will refer to [facility]. I will send report to PCP. Patient unable to make appt at time. I will send letter to remind her to make appt and stress the importance of compliance.

## 2014-12-28 DIAGNOSIS — Z1231 Encounter for screening mammogram for malignant neoplasm of breast: Secondary | ICD-10-CM | POA: Diagnosis not present

## 2015-02-06 DIAGNOSIS — Z6841 Body Mass Index (BMI) 40.0 and over, adult: Secondary | ICD-10-CM | POA: Diagnosis not present

## 2015-02-06 DIAGNOSIS — Z01411 Encounter for gynecological examination (general) (routine) with abnormal findings: Secondary | ICD-10-CM | POA: Diagnosis not present

## 2015-03-07 ENCOUNTER — Encounter: Payer: Self-pay | Admitting: Neurology

## 2015-03-07 ENCOUNTER — Ambulatory Visit (INDEPENDENT_AMBULATORY_CARE_PROVIDER_SITE_OTHER): Payer: Medicare Other | Admitting: Neurology

## 2015-03-07 VITALS — BP 166/108 | HR 78 | Resp 16 | Ht 61.0 in | Wt 241.0 lb

## 2015-03-07 DIAGNOSIS — R51 Headache: Secondary | ICD-10-CM | POA: Diagnosis not present

## 2015-03-07 DIAGNOSIS — R519 Headache, unspecified: Secondary | ICD-10-CM

## 2015-03-07 DIAGNOSIS — G4733 Obstructive sleep apnea (adult) (pediatric): Secondary | ICD-10-CM

## 2015-03-07 DIAGNOSIS — Z9989 Dependence on other enabling machines and devices: Principal | ICD-10-CM

## 2015-03-07 NOTE — Progress Notes (Signed)
Subjective:    Patient ID: Sandra Brennan is a 70 y.o. female.  HPI     Interim history:  [name] is a 70 year old right-handed woman with an underlying medical history of hypertension, asthma, shortness of breath, coronary artery disease, status post MI, reflux disease, type 2 diabetes, anemia, migraine headaches, arthritis, osteoporosis, gout, COPD, obesity, lumbar spinal stenosis, status post left hip replacement surgery 2 years ago, status post left knee replacement in the 1980s and status post right total knee replacement in 2001, who presents for follow-up consultation of her obstructive sleep apnea, after her recent sleep studies. The patient is unaccompanied today. I first met her on 10/24/2014 at the request of Dr. Krista Blue, at which time the patient reported a prior diagnosis of OSA but she was no longer on CPAP therapy. She reported snoring, nonrestorative sleep and excessive daytime somnolence. I invited her back for sleep study. She had a baseline sleep study, followed by a CPAP titration study. I went over her test results with her in detail today. Her baseline sleep study from 11/27/2014 showed a sleep efficiency of 94.8% with a normal sleep latency of 12.5 minutes and wake after sleep onset of 7.5 minutes. She had an increased percentage of stage II sleep, absence of slow-wave sleep and REM sleep at 19.2% with a normal REM latency. She had no significant PLMS, EKG or EEG changes. Mild snoring was noted. She slept mostly on her back. Total AHI was 10.2 per hour, rising to 42.5 per hour during REM sleep. Average oxygen saturation was 96%, nadir was 71% during REM sleep. Time below 90% saturation was 12 minutes and 25 seconds. Based on her sleep-related complaints and her test results in particular significant desaturations in REM sleep I invited her back for a full night CPAP titration study. She had this on 12/14/2014. Sleep efficiency was 85.3% with a prolonged sleep latency of 43.5 minutes  and wake after sleep onset of 26.5 minutes with mild sleep fragmentation noted. She had a normal percentage of light stage sleep, an increased percentage of slow-wave sleep at 33.1% and a mildly decreased percentage of REM sleep at 12% with a normal REM latency. She had no significant PLMS, EKG or EEG changes. Baseline oxygen saturation was 98%, nadir was 92%. CPAP was titrated from 5 cm to 6 cm. Her AHI was 0 per hour at that pressure. Supine REM sleep was achieved on the final pressure. Based on her test results I prescribed CPAP therapy for home use.  Today, 03/07/2015: I reviewed her CPAP compliance data from 02/03/2015 through 03/04/2015 which is a total of 30 days during which time she used her machine 29 days with percent used days greater than 4 hours at 97%, indicating excellent compliance with an average usage of 6 hours and 23 minutes, residual AHI 1 per hour, leak acceptable with the 95th percentile at 12.1 L/m on a pressure of 6 cm with EPR of 1.  Today, 03/07/2015: She reports that she is doing better with CPAP this time and tolerates it better than in the past. She feels slightly improved, but still has some recurrence of headaches. She has been taking nortriptyline 2 pills each night. She has had some recent stress as well.   Previously:   10/24/2014: She carries a prior diagnosis of obstructive sleep apnea. She is no longer on CPAP treatment. I reviewed your office note from 08/16/2014. For headaches you started her on nortriptyline. Prior sleep test results are not available for  my review. She still snores. She does not wake up rested. Her Epworth sleepiness score is 11 out of 24, fatigue score is 48 out of 63 today. She had a difficult time tolerating CPAP in the past. She reports that she was much heavier (330 lb) at the time of her original sleep apnea diagnosis. She had a sleep study several years ago and has not used CPAP in about 6 years. She had CPAP with oxygen in the past. She  was able to lose weight. She was down to 190 pounds but unfortunately gained weight back. She is currently at 240 pounds. She takes care of her 62 year old mother. 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 She drinks iced tea, one cup a day and has cut back on her sodas quite a bit she states. She quit smoking years ago. She would be willing to be retested for sleep apnea and consider CPAP treatment again if the need arises. She denies any frank restless leg syndrome type symptoms. She's not sure if she twitches in her sleep. Bedtime is around 9 PM and rise time is around 8 to 8:30. Despite a fairly long sleep time she does not wake up rested. She has nocturia typically 2-3 times per night. She denies morning headaches. Migraines have improved since she was started on nortriptyline. She currently works 10 hours a week for the [employer]. She had an EMG and nerve conduction study in April and requested test results for those. I went over the results with her today.   Her Past Medical History Is Significant For: Past Medical History  Diagnosis Date  . Hypertension   . Asthma   . Shortness of breath     when stressed.  . Myocardial infarction [facility]) 1992    2012/09/02 "mild MI when son died "  . GERD (gastroesophageal reflux disease)   . Sleep apnea     "never did fix my machine; haven't used one for 5 years; I've lost 116# since then; no problems now" (Sep 02, 2012)  . Type II diabetes mellitus ([address])     "borderline; don't test; take Metformin" (September 02, 2012)  . Anemia   . Migraines     "used to; totally stopped when I quit drinking 30 yr ago" (2012/09/02)  . Arthritis     "plenty" (Sep 02, 2012)  . Degenerative arthritis     "all over" (2012-09-02)  . Osteoporosis     "all over" (09/02/12)  . Chronic lower back pain     "they say I need a whole new spinal column" (09-02-2012)  . Gout 05/05/2013  . Myalgia and myositis   . Obesity   . COPD (chronic obstructive pulmonary disease)  ([place])     Her Past Surgical History Is Significant For: Past Surgical History  Procedure Laterality Date  . Ganglion cyst excision Right 1980's    "wrist" (September 02, 2012)  . Knee ligament reconstruction Right 1980's  . Total hip arthroplasty Left 2012-09-02  . Tonsillectomy  ~ 1954  . Total knee arthroplasty Left 2001  . Total knee arthroplasty Right 2004  . Joint replacement    . Abdominal hysterectomy  1990's  . Total hip arthroplasty Left 09/02/2012    Procedure: TOTAL HIP ARTHROPLASTY;  Surgeon: Garald Balding, MD;  Location: [facility];  Service: Orthopedics;  Laterality: Left;  Left Total Hip Arthroplasty  . Total hip arthroplasty Left 08/28/2012    Procedure: Irrigation and Debridement hip ;  Surgeon: Mcarthur Rossetti, MD;  Location: [facility]  OR;  Service: Orthopedics;  Laterality: Left;    Her Family History Is Significant For: Family History  Problem Relation Age of Onset  . Arthritis Mother   . Arthritis Father   . Diabetes Sister   . Arthritis Sister   . Diabetes Maternal Grandmother   . Arthritis Maternal Grandmother   . Diabetes Maternal Grandfather   . Arthritis Maternal Grandfather   . Colon cancer Father     Her Social History Is Significant For: Social History   Social History  . Marital Status: Widowed    Spouse Name: N/A  . Number of Children: 5  . Years of Education: PhD   Occupational History  . Retired    Social History Main Topics  . Smoking status: Former Smoker -- 0.12 packs/day for 2 years    Types: Cigarettes  . Smokeless tobacco: Never Used     Comment: 08/03/2012 "quit 30 years ago"  . Alcohol Use: No     Comment: 08/03/2012 "used to be a beeralcoholic"; stopped ~ 30 yr ago"  . Drug Use: No  . Sexual Activity: No   Other Topics Concern  . None   Social History Narrative   Lives at home with her mother and caregiver.   Occasional use of caffeine.   Right-handed.    Her Allergies Are:  Allergies  Allergen Reactions  . Cymbalta  [Duloxetine Hcl] Anaphylaxis  . Penicillins Hives  . Sulfa Antibiotics Other (See Comments)    REACTION: unknown  . Theophyllines Hives  :   Her Current Medications Are:  Outpatient Encounter Prescriptions as of 03/07/2015  Medication Sig  . albuterol (PROVENTIL HFA;VENTOLIN HFA) 108 (90 BASE) MCG/ACT inhaler Inhale 2 puffs into the lungs every 6 (six) hours as needed for wheezing.  Marland Kitchen aspirin EC 81 MG tablet Take 81 mg by mouth daily.  . colchicine 0.6 MG tablet Take 0.6 mg by mouth 2 (two) times daily.  . cycloSPORINE (RESTASIS) 0.05 % ophthalmic emulsion Place 1 drop into both eyes 2 (two) times daily.  . diclofenac (VOLTAREN) 50 MG EC tablet Take 50 mg by mouth 2 (two) times daily.  Marland Kitchen gabapentin (NEURONTIN) 600 MG tablet   . HYDROcodone-acetaminophen (NORCO) 7.5-325 MG per tablet Take 1 tablet by mouth every 6 (six) hours as needed. for pain  . metFORMIN (GLUCOPHAGE-XR) 500 MG 24 hr tablet Take 500 mg by mouth every morning.  . nortriptyline (PAMELOR) 10 MG capsule Take 2 capsules at bedtime.  . potassium chloride SA (K-DUR,KLOR-CON) 20 MEQ tablet Take 20 mEq by mouth 2 (two) times daily.  Marland Kitchen triamterene-hydrochlorothiazide (MAXZIDE-25) 37.5-25 MG per tablet Take 1 tablet by mouth daily.  . Vitamin D, Ergocalciferol, (DRISDOL) 50000 UNITS CAPS capsule Take 50,000 Units by mouth once a week.   No facility-administered encounter medications on file as of 03/07/2015.  :  Review of Systems:  Out of a complete 14 point review of systems, all are reviewed and negative with the exception of these symptoms as listed below:   Review of Systems  Neurological:       Patient feels like she is doing ok on CPAP. Has had a little trouble with watery eyes and nasal passage. Reports some trouble sleeping with CPAP.     Objective:  Neurologic Exam  Physical Exam Physical Examination:   Filed Vitals:   03/07/15 1349  BP: 166/108  Pulse: 78  Resp: 16   General Examination: The patient is a  very pleasant 70 y.o. female in no acute  distress. She appears well-developed and well-nourished and adequately groomed. She denies CP/SOB or HA.  HEENT: Normocephalic, atraumatic, pupils are equal, round and reactive to light and accommodation. Extraocular tracking is good without limitation to gaze excursion or nystagmus noted. Normal smooth pursuit is noted. Hearing is grossly intact. Face is symmetric with normal facial animation and normal facial sensation. Speech is clear with no dysarthria noted. There is no hypophonia. There is no lip, neck/head, jaw or voice tremor. Neck is supple with full range of passive and active motion. There are no carotid bruits on auscultation. Oropharynx exam reveals: moderate mouth dryness, adequate dental hygiene and moderate airway crowding, due to wider tongue and redundant soft palate. Mallampati is class II. Tongue protrudes centrally and palate elevates symmetrically. Tonsils are absent. She has a mild overbite. Nasal inspection reveals no significant nasal mucosal bogginess or redness and no septal deviation.   Chest: Clear to auscultation without wheezing, rhonchi or crackles noted.  Heart: S1+S2+0, regular and normal without murmurs, rubs or gallops noted.   Abdomen: Soft, non-tender and non-distended with normal bowel sounds appreciated on auscultation.  Extremities: There is trace pitting edema in the distal lower extremities bilaterally, right more noticible. Pedal pulses are intact.  Skin: Warm and dry without trophic changes noted. There are no varicose veins.  Musculoskeletal: exam reveals: 2 large scars on the Right knee and one on the L, is not wearing her ankle brace today.   Neurologically:  Mental status: The patient is awake, alert and oriented in all 4 spheres. Her immediate and remote memory, attention, language skills and fund of knowledge are appropriate. There is no evidence of aphasia, agnosia, apraxia or anomia. Speech is clear with  normal prosody and enunciation. Thought process is linear. Mood is normal and affect is normal.  Cranial nerves II - XII are as described above under HEENT exam. In addition: shoulder shrug is normal with equal shoulder height noted. Motor exam: Normal bulk, strength and tone is noted. There is no drift, tremor or rebound. Romberg is negative. Reflexes are 1+ in the UEs and diminished in the LEs. Fine motor skills and coordination: intact.  Cerebellar testing: No dysmetria or intention tremor on finger to nose testing. Heel to shin is very difficult for her secondary to body habitus and also joint replacement surgeries.  Sensory exam: intact to light touch in the UEs and LEs.  Gait, station and balance: She stands with difficulty. No veering to one side is noted. No leaning to one side is noted. Posture is age-appropriate and stance is wide based. Gait is cautious. She has no cane today. She is not able to do tandem walk.               Assessment and Plan:   In summary, PAULINE PEGUES is a very pleasant 69 year old female with an underlying complex medical history of hypertension, asthma, shortness of breath, coronary artery disease, status post MI, reflux disease, type 2 diabetes, anemia, migraine headaches, arthritis, osteoporosis, gout, COPD, obesity, neuropathy, lumbar spinal stenosis, status post left hip replacement surgery 2 years ago, status post left knee replacement in the 1980s and status post right total knee replacement in 2001, who presents for follow up consultation of her OSA, now back on CPAP at 6 cm with full compliance and reasonably good improvement. She had a baseline sleep study in September 2016 followed by a CPAP titration study in October 2016 and we talked about her test results in detail. She  has since then establish treatment with CPAP at 6 cm of water pressure and is compliant with treatment. She is congratulated on her treatment adherence. We reviewed her compliance data. She  does have improvement in her sleep and has been able to tolerate treatment this time around better than in the past but still has some ongoing issues with recurrent headaches. Her blood pressure is elevated today but she has no additional symptoms. She endorses taking all her medications. She does endorse stress. She is advised to follow-up with Dr. Krista Blue for her headache management and I will see her back in follow-up for sleep apnea in about 6 months, and then yearly thereafter if she continues to do well. Her physical exam is stable.  We reviewed the diagnosis of OSA, its prognosis and treatment options. I explained the risk of untreated moderate to severe OSA, especially with respect to developing cardiovascular disease down the Road, including congestive heart failure, difficult to treat hypertension, cardiac arrhythmias, or stroke. Even type 2 diabetes has, in part, been linked to untreated OSA. Symptoms of untreated OSA include daytime sleepiness, memory problems, mood irritability and mood disorder such as depression and anxiety, lack of energy, as well as recurrent headaches, especially morning headaches.  I encouraged the patient to eat healthy, exercise daily and keep well hydrated, to keep a scheduled bedtime and wake time routine, to not skip any meals and eat healthy snacks in between meals. I advised the patient not to drive when feeling sleepy. I explained the importance of being compliant with PAP treatment, not only for insurance purposes but primarily to improve Her symptoms, and for the patient's long term health benefit, including to reduce Her cardiovascular risks. I answered all her questions today and the patient was in agreement with above plan.  I spent 25 minutes in total face-to-face time with the patient, more than 50% of which was spent in counseling and coordination of care, reviewing test results, reviewing medication and discussing or reviewing the diagnosis of OSA, its prognosis  and treatment options.

## 2015-03-07 NOTE — Patient Instructions (Addendum)
Please continue using your CPAP regularly. While your insurance requires that you use CPAP at least 4 hours each night on 70% of the nights, I recommend, that you not skip any nights and use it throughout the night if you can. Getting used to CPAP and staying with the treatment long term does take time and patience and discipline. Untreated obstructive sleep apnea when it is moderate to severe can have an adverse impact on cardiovascular health and raise her risk for heart disease, arrhythmias, hypertension, congestive heart failure, stroke and diabetes. Untreated obstructive sleep apnea causes sleep disruption, nonrestorative sleep, and sleep deprivation. This can have an impact on your day to day functioning and cause daytime sleepiness and impairment of cognitive function, memory loss, mood disturbance, and problems focussing. Using CPAP regularly can improve these symptoms.  Keep up the good work! I will see you back in 6 months for sleep apnea check up, and if you continue to do well on CPAP I will see you once a year thereafter.   Please also follow up with Dr. Krista Blue.

## 2015-03-15 ENCOUNTER — Ambulatory Visit: Payer: Medicare Other | Admitting: Neurology

## 2015-03-20 ENCOUNTER — Other Ambulatory Visit (HOSPITAL_COMMUNITY): Payer: Self-pay | Admitting: Pulmonary Disease

## 2015-03-20 DIAGNOSIS — R1310 Dysphagia, unspecified: Secondary | ICD-10-CM

## 2015-03-20 DIAGNOSIS — Z79891 Long term (current) use of opiate analgesic: Secondary | ICD-10-CM | POA: Diagnosis not present

## 2015-03-20 DIAGNOSIS — K222 Esophageal obstruction: Secondary | ICD-10-CM

## 2015-03-27 ENCOUNTER — Other Ambulatory Visit (HOSPITAL_COMMUNITY): Payer: Medicare Other

## 2015-03-27 ENCOUNTER — Ambulatory Visit (HOSPITAL_COMMUNITY)
Admission: RE | Admit: 2015-03-27 | Discharge: 2015-03-27 | Disposition: A | Discharge status: Home / Self Care | Payer: Medicare Other | Source: Ambulatory Visit | Attending: Pulmonary Disease | Admitting: Pulmonary Disease

## 2015-03-27 DIAGNOSIS — M479 Spondylosis, unspecified: Secondary | ICD-10-CM | POA: Insufficient documentation

## 2015-03-27 DIAGNOSIS — M5136 Other intervertebral disc degeneration, lumbar region: Secondary | ICD-10-CM | POA: Diagnosis not present

## 2015-03-27 DIAGNOSIS — K222 Esophageal obstruction: Secondary | ICD-10-CM | POA: Insufficient documentation

## 2015-03-27 DIAGNOSIS — Q7649 Other congenital malformations of spine, not associated with scoliosis: Secondary | ICD-10-CM | POA: Diagnosis not present

## 2015-03-27 DIAGNOSIS — R1310 Dysphagia, unspecified: Secondary | ICD-10-CM | POA: Insufficient documentation

## 2015-03-27 DIAGNOSIS — M47896 Other spondylosis, lumbar region: Secondary | ICD-10-CM | POA: Insufficient documentation

## 2015-03-27 DIAGNOSIS — K449 Diaphragmatic hernia without obstruction or gangrene: Secondary | ICD-10-CM | POA: Insufficient documentation

## 2015-03-27 MED ORDER — MAGNESIUM HYDROXIDE 400 MG/5ML PO SUSP
ORAL | Status: AC
Start: 2015-03-27 — End: 2015-03-27
  Filled 2015-03-27: qty 30

## 2015-05-11 ENCOUNTER — Encounter: Payer: Self-pay | Admitting: *Deleted

## 2015-05-29 DIAGNOSIS — E559 Vitamin D deficiency, unspecified: Secondary | ICD-10-CM | POA: Diagnosis not present

## 2015-05-29 DIAGNOSIS — E1140 Type 2 diabetes mellitus with diabetic neuropathy, unspecified: Secondary | ICD-10-CM | POA: Diagnosis not present

## 2015-05-29 DIAGNOSIS — Z79899 Other long term (current) drug therapy: Secondary | ICD-10-CM | POA: Diagnosis not present

## 2015-05-29 DIAGNOSIS — I119 Hypertensive heart disease without heart failure: Secondary | ICD-10-CM | POA: Diagnosis not present

## 2015-06-21 DIAGNOSIS — K219 Gastro-esophageal reflux disease without esophagitis: Secondary | ICD-10-CM | POA: Insufficient documentation

## 2015-06-21 DIAGNOSIS — R1313 Dysphagia, pharyngeal phase: Secondary | ICD-10-CM | POA: Insufficient documentation

## 2015-06-21 DIAGNOSIS — K2100 Gastro-esophageal reflux disease with esophagitis, without bleeding: Secondary | ICD-10-CM | POA: Insufficient documentation

## 2015-06-27 ENCOUNTER — Other Ambulatory Visit (HOSPITAL_COMMUNITY): Payer: Self-pay | Admitting: Otolaryngology

## 2015-06-27 DIAGNOSIS — R1310 Dysphagia, unspecified: Secondary | ICD-10-CM

## 2015-07-02 ENCOUNTER — Other Ambulatory Visit: Payer: Self-pay | Admitting: Neurology

## 2015-07-03 ENCOUNTER — Ambulatory Visit (HOSPITAL_COMMUNITY)
Admission: RE | Admit: 2015-07-03 | Discharge: 2015-07-03 | Disposition: A | Discharge status: Home / Self Care | Payer: Medicare Other | Source: Ambulatory Visit | Attending: Otolaryngology | Admitting: Otolaryngology

## 2015-07-03 DIAGNOSIS — Q387 Congenital pharyngeal pouch: Secondary | ICD-10-CM | POA: Insufficient documentation

## 2015-07-03 DIAGNOSIS — R1310 Dysphagia, unspecified: Secondary | ICD-10-CM

## 2015-07-03 DIAGNOSIS — R0989 Other specified symptoms and signs involving the circulatory and respiratory systems: Secondary | ICD-10-CM | POA: Diagnosis not present

## 2015-09-08 ENCOUNTER — Other Ambulatory Visit: Payer: Self-pay | Admitting: Orthopaedic Surgery

## 2015-09-08 DIAGNOSIS — M542 Cervicalgia: Secondary | ICD-10-CM

## 2015-09-08 DIAGNOSIS — M5030 Other cervical disc degeneration, unspecified cervical region: Secondary | ICD-10-CM

## 2015-09-10 ENCOUNTER — Ambulatory Visit (INDEPENDENT_AMBULATORY_CARE_PROVIDER_SITE_OTHER): Payer: Medicare Other | Admitting: Neurology

## 2015-09-10 ENCOUNTER — Encounter: Payer: Self-pay | Admitting: Neurology

## 2015-09-10 VITALS — BP 136/80 | HR 82 | Resp 18 | Ht 61.0 in | Wt 237.0 lb

## 2015-09-10 DIAGNOSIS — G4733 Obstructive sleep apnea (adult) (pediatric): Secondary | ICD-10-CM

## 2015-09-10 DIAGNOSIS — Z9989 Dependence on other enabling machines and devices: Principal | ICD-10-CM

## 2015-09-10 NOTE — Progress Notes (Signed)
Subjective:    Patient ID: Sandra Brennan is a 70 y.o. female.  HPI     Interim history:   Sandra Brennan is a 70 year old right-handed woman with an underlying medical history of hypertension, asthma, shortness of breath, coronary artery disease, status post MI, reflux disease, type 2 diabetes, anemia, migraine headaches, arthritis, osteoporosis, gout, COPD, obesity, lumbar spinal stenosis, status post left hip replacement surgery 2 years ago, status post left knee replacement in the 1980s and status post right total knee replacement in 2001, who presents for follow-up consultation of her obstructive sleep apnea, established on CPAP therapy. The patient is unaccompanied today. I last saw her on 03/07/2015, after her recent sleep studies, at which time she reported doing better with CPAP and tolerating it. She felt slightly better, still had some issues with recurrent headaches. She was on nortriptyline 2 pills each night per Dr. Krista Blue. We reviewed her sleep study results and her compliance data at the time. She had a baseline sleep study in September 2016, followed by a CPAP titration study in October 2016.  Today, 09/10/2015: I reviewed her CPAP compliance data from 08/06/2015 through 09/04/2015 which is a total of 30 days during which time she used her machine 29 days with percent used days greater than 4 hours at 93%, indicating excellent compliance with an average usage for all days of 7 hours and 9 minutes, residual AHI low at 0.6 per hour, leak acceptable with the 95th percentile at 12.2 L/m on a pressure of 6 cm with EPR of 1.   Today, 09/10/2015: She reports doing okay, feels like sometimes she has a runny nose and sneezing from the CPAP. Otherwise, still feels better with the CPAP, Especially after not being able to use CPAP in the past when she was first diagnosed with OSA. Sees Dr. Durward Fortes for R shoulder pain, neck pain and R foot pain. She take care of her 99 yo mother. Sadly, lost her  brother recently in June 2017. Has changed filter and mask recently. She has lost a few lb.   Previously:   I first met her on 10/24/2014 at the request of Dr. Krista Blue, at which time the patient reported a prior diagnosis of OSA but she was no longer on CPAP therapy. She reported snoring, nonrestorative sleep and excessive daytime somnolence. I invited her back for sleep study. She had a baseline sleep study, followed by a CPAP titration study. I went over her test results with her in detail today. Her baseline sleep study from 11/27/2014 showed a sleep efficiency of 94.8% with a normal sleep latency of 12.5 minutes and wake after sleep onset of 7.5 minutes. She had an increased percentage of stage II sleep, absence of slow-wave sleep and REM sleep at 19.2% with a normal REM latency. She had no significant PLMS, EKG or EEG changes. Mild snoring was noted. She slept mostly on her back. Total AHI was 10.2 per hour, rising to 42.5 per hour during REM sleep. Average oxygen saturation was 96%, nadir was 71% during REM sleep. Time below 90% saturation was 12 minutes and 25 seconds. Based on her sleep-related complaints and her test results in particular significant desaturations in REM sleep I invited her back for a full night CPAP titration study. She had this on 12/14/2014. Sleep efficiency was 85.3% with a prolonged sleep latency of 43.5 minutes and wake after sleep onset of 26.5 minutes with mild sleep fragmentation noted. She had a normal percentage of light stage sleep,  an increased percentage of slow-wave sleep at 33.1% and a mildly decreased percentage of REM sleep at 12% with a normal REM latency. She had no significant PLMS, EKG or EEG changes. Baseline oxygen saturation was 98%, nadir was 92%. CPAP was titrated from 5 cm to 6 cm. Her AHI was 0 per hour at that pressure. Supine REM sleep was achieved on the final pressure. Based on her test results I prescribed CPAP therapy for home use.  I reviewed her CPAP  compliance data from 02/03/2015 through 03/04/2015 which is a total of 30 days during which time she used her machine 29 days with percent used days greater than 4 hours at 97%, indicating excellent compliance with an average usage of 6 hours and 23 minutes, residual AHI 1 per hour, leak acceptable with the 95th percentile at 12.1 L/m on a pressure of 6 cm with EPR of 1.  10/24/2014: She carries a prior diagnosis of obstructive sleep apnea. She is no longer on CPAP treatment. I reviewed your office note from 08/16/2014. For headaches you started her on nortriptyline. Prior sleep test results are not available for my review. She still snores. She does not wake up rested. Her Epworth sleepiness score is 11 out of 24, fatigue score is 48 out of 63 today. She had a difficult time tolerating CPAP in the past. She reports that she was much heavier (330 lb) at the time of her original sleep apnea diagnosis. She had a sleep study several years ago and has not used CPAP in about 6 years. She had CPAP with oxygen in the past. She was able to lose weight. She was down to 190 pounds but unfortunately gained weight back. She is currently at 240 pounds. She takes care of her 57 year old mother. 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 She drinks iced tea, one cup a day and has cut back on her sodas quite a bit she states. She quit smoking years ago. She would be willing to be retested for sleep apnea and consider CPAP treatment again if the need arises. She denies any frank restless leg syndrome type symptoms. She's not sure if she twitches in her sleep. Bedtime is around 9 PM and rise time is around 8 to 8:30. Despite a fairly long sleep time she does not wake up rested. She has nocturia typically 2-3 times per night. She denies morning headaches. Migraines have improved since she was started on nortriptyline. She currently works 10 hours a week for the [employer]. She had an EMG and nerve  conduction study in April and requested test results for those. I went over the results with her today.   Her Past Medical History Is Significant For: Past Medical History  Diagnosis Date  . Hypertension   . Asthma   . Shortness of breath     when stressed.  . Myocardial infarction [facility]) 1992    08-22-2012 "mild MI when son died "  . GERD (gastroesophageal reflux disease)   . Sleep apnea     "never did fix my machine; haven't used one for 5 years; I've lost 116# since then; no problems now" (August 22, 2012)  . Type II diabetes mellitus ([place])     "borderline; don't test; take Metformin" (08/22/2012)  . Anemia   . Migraines     "used to; totally stopped when I quit drinking 30 yr ago" (08-22-2012)  . Arthritis     "plenty" (08-22-2012)  . Degenerative arthritis     "  all over" (08/03/2012)  . Osteoporosis     "all over" (08/03/2012)  . Chronic lower back pain     "they say I need a whole new spinal column" (08/03/2012)  . Gout 05/05/2013  . Myalgia and myositis   . Obesity   . COPD (chronic obstructive pulmonary disease) ([place])     Her Past Surgical History Is Significant For: Past Surgical History  Procedure Laterality Date  . Ganglion cyst excision Right 1980's    "wrist" (08/03/2012)  . Knee ligament reconstruction Right 1980's  . Total hip arthroplasty Left 08/03/2012  . Tonsillectomy  ~ 1954  . Total knee arthroplasty Left 2001  . Total knee arthroplasty Right 2004  . Joint replacement    . Abdominal hysterectomy  1990's  . Total hip arthroplasty Left 08/03/2012    Procedure: TOTAL HIP ARTHROPLASTY;  Surgeon: Garald Balding, MD;  Location: [facility];  Service: Orthopedics;  Laterality: Left;  Left Total Hip Arthroplasty  . Total hip arthroplasty Left 08/28/2012    Procedure: Irrigation and Debridement hip ;  Surgeon: Mcarthur Rossetti, MD;  Location: [facility];  Service: Orthopedics;  Laterality: Left;    Her Family History Is Significant For: Family History  Problem Relation Age of Onset   . Arthritis Mother   . Arthritis Father   . Diabetes Sister   . Arthritis Sister   . Diabetes Maternal Grandmother   . Arthritis Maternal Grandmother   . Diabetes Maternal Grandfather   . Arthritis Maternal Grandfather   . Colon cancer Father     Her Social History Is Significant For: Social History   Social History  . Marital Status: Widowed    Spouse Name: N/A  . Number of Children: 5  . Years of Education: PhD   Occupational History  . Retired    Social History Main Topics  . Smoking status: Former Smoker -- 0.12 packs/day for 2 years    Types: Cigarettes  . Smokeless tobacco: Never Used     Comment: 08/03/2012 "quit 30 years ago"  . Alcohol Use: No     Comment: 08/03/2012 "used to be a beeralcoholic"; stopped ~ 30 yr ago"  . Drug Use: No  . Sexual Activity: No   Other Topics Concern  . None   Social History Narrative   Lives at home with her mother and caregiver.   Occasional use of caffeine.   Right-handed.    Her Allergies Are:  Allergies  Allergen Reactions  . Cymbalta [Duloxetine Hcl] Anaphylaxis  . Penicillins Hives  . Sulfa Antibiotics Other (See Comments)    REACTION: unknown  . Theophyllines Hives  :   Her Current Medications Are:  Outpatient Encounter Prescriptions as of 09/10/2015  Medication Sig  . albuterol (PROVENTIL HFA;VENTOLIN HFA) 108 (90 BASE) MCG/ACT inhaler Inhale 2 puffs into the lungs every 6 (six) hours as needed for wheezing.  Marland Kitchen aspirin EC 81 MG tablet Take 81 mg by mouth daily.  Marland Kitchen atorvastatin (LIPITOR) 10 MG tablet   . colchicine 0.6 MG tablet Take 0.6 mg by mouth 2 (two) times daily.  . cycloSPORINE (RESTASIS) 0.05 % ophthalmic emulsion Place 1 drop into both eyes 2 (two) times daily.  . diclofenac (VOLTAREN) 50 MG EC tablet Take 50 mg by mouth 2 (two) times daily.  Marland Kitchen gabapentin (NEURONTIN) 600 MG tablet   . HYDROcodone-acetaminophen (NORCO) 7.5-325 MG per tablet Take 1 tablet by mouth every 6 (six) hours as needed. for pain   . metFORMIN (GLUCOPHAGE-XR) 500  MG 24 hr tablet Take 500 mg by mouth every morning.  . nortriptyline (PAMELOR) 10 MG capsule TAKE 2 CAPSULES BY MOUTH EVERY DAY AT BEDTIME  . potassium chloride SA (K-DUR,KLOR-CON) 20 MEQ tablet Take 20 mEq by mouth 2 (two) times daily.  Marland Kitchen triamterene-hydrochlorothiazide (MAXZIDE-25) 37.5-25 MG per tablet Take 1 tablet by mouth daily.  . Vitamin D, Ergocalciferol, (DRISDOL) 50000 UNITS CAPS capsule Take 50,000 Units by mouth once a week.   No facility-administered encounter medications on file as of 09/10/2015.  :  Review of Systems:  Out of a complete 14 point review of systems, all are reviewed and negative with the exception of these symptoms as listed below:    Review of Systems  Neurological:       Patient states that she is doing ok with CPAP. Reports that sometimes it causes sneezing and runny nose.     Objective:  Neurologic Exam  Physical Exam Physical Examination:   Filed Vitals:   09/10/15 1345  BP: 136/80  Pulse: 82  Resp: 18   General Examination: The patient is a very pleasant 70 y.o. female in no acute distress. She appears well-developed and well-nourished and well groomed.   HEENT: Normocephalic, atraumatic, pupils are equal, round and reactive to light and accommodation. Extraocular tracking is good without limitation to gaze excursion or nystagmus noted. Normal smooth pursuit is noted. Hearing is grossly intact. Face is symmetric with normal facial animation and normal facial sensation. Speech is clear with no dysarthria noted. There is no hypophonia. There is no lip, neck/head, jaw or voice tremor. Neck is supple with full range of passive and active motion. There are no carotid bruits on auscultation. Oropharynx exam reveals: mild mouth dryness, adequate dental hygiene and moderate airway crowding, due to wider tongue and redundant soft palate. Mallampati is class II. Tongue protrudes centrally and palate elevates symmetrically.  Tonsils are absent. She has a mild overbite. Nasal inspection reveals mild nasal mucosal bogginess or redness and no septal deviation.   Chest: Clear to auscultation without wheezing, rhonchi or crackles noted.  Heart: S1+S2+0, regular and normal without murmurs, rubs or gallops noted.   Abdomen: Soft, non-tender and non-distended with normal bowel sounds appreciated on auscultation.  Extremities: There is trace pitting edema in the distal lower extremities bilaterally, right more noticible. Pedal pulses are intact.  Skin: Warm and dry without trophic changes noted. There are no varicose veins.  Musculoskeletal: exam reveals: 2 large scars on the Right knee and one on the L, is wearing her R AFO today.   Neurologically:  Mental status: The patient is awake, alert and oriented in all 4 spheres. Her immediate and remote memory, attention, language skills and fund of knowledge are appropriate. There is no evidence of aphasia, agnosia, apraxia or anomia. Speech is clear with normal prosody and enunciation. Thought process is linear. Mood is normal and affect is normal.  Cranial nerves II - XII are as described above under HEENT exam. In addition: shoulder shrug is normal with equal shoulder height noted. Motor exam: Normal bulk, strength and tone is noted. There is no drift, tremor or rebound. Romberg is negative. Reflexes are 1+ in the UEs and diminished in the LEs. Fine motor skills and coordination: intact.  Cerebellar testing: No dysmetria or intention tremor on finger to nose testing. Heel to shin is very difficult for her secondary to body habitus and also joint replacement surgeries.  Sensory exam: intact to light touch in the UEs and LEs.  Gait, station and balance: She stands with difficulty. No veering to one side is noted. No leaning to one side is noted. Posture is age-appropriate and stance is wide based. Gait is cautious. She has no cane today. She is not able to do tandem walk.                Assessment and Plan:   In summary, KAYLISE BLAKELEY is a very pleasant 70 year old female with an underlying complex medical history of hypertension, asthma, shortness of breath, coronary artery disease, status post MI, reflux disease, type 2 diabetes, anemia, migraine headaches, arthritis, osteoporosis, gout, COPD, obesity, neuropathy, lumbar spinal stenosis, status post left hip replacement surgery 2 years ago, status post left knee replacement in the 1980s and status post right total knee replacement in 2001, who presents for follow up consultation of her OSA, established on CPAP at 6 cm with excellent compliance and reasonably good results reported. She had a baseline sleep study in September 2016 followed by a CPAP titration study in October 2016 and we talked about her test results in detail at our last appointment in Jan. 2017 and with brief recap today. She is commended for her treatment adherence. Her physical exam is stable. We reviewed her most recent compliance data. She continues to report improvement in her sleep consolidation, and sleep quality. She has had some allergy symptoms for which she has taken over-the-counter allergy medication. In addition, I suggested she should try a nasal saline rinse system such as Neti pot. From my end of things, she is doing well and I suggested a one-year checkup. She is encouraged to reschedule her appointment with Dr. Krista Blue as she had to cancel her last appointment.  I encouraged the patient to eat healthy, exercise daily and keep well hydrated.  I explained the importance of being compliant with PAP treatment, not only for insurance purposes but primarily to improve Her symptoms, and for the patient's long term health benefit, including to reduce Her cardiovascular risks. I answered all her questions today and the patient was in agreement.  I spent 25 minutes in total face-to-face time with the patient, more than 50% of which was spent in counseling and  coordination of care, reviewing test results, reviewing medication and discussing or reviewing the diagnosis of OSA, its prognosis and treatment options.

## 2015-09-10 NOTE — Patient Instructions (Addendum)
Please continue using your CPAP regularly. While your insurance requires that you use CPAP at least 4 hours each night on 70% of the nights, I recommend, that you not skip any nights and use it throughout the night if you can. Getting used to CPAP and staying with the treatment long term does take time and patience and discipline. Untreated obstructive sleep apnea when it is moderate to severe can have an adverse impact on cardiovascular health and raise her risk for heart disease, arrhythmias, hypertension, congestive heart failure, stroke and diabetes. Untreated obstructive sleep apnea causes sleep disruption, nonrestorative sleep, and sleep deprivation. This can have an impact on your day to day functioning and cause daytime sleepiness and impairment of cognitive function, memory loss, mood disturbance, and problems focussing. Using CPAP regularly can improve these symptoms.  Keep up the good work! I will see you back in 1 year for sleep apnea check up.   Please look into using a salt water nasal rinse system, such as the [employer] and follow the directions and my instructions. It may help your nasal congestion, allergy symptoms, and help you tolerate your CPAP.

## 2015-09-15 ENCOUNTER — Ambulatory Visit
Admission: RE | Admit: 2015-09-15 | Discharge: 2015-09-15 | Disposition: A | Discharge status: Home / Self Care | Payer: PRIVATE HEALTH INSURANCE | Source: Ambulatory Visit | Attending: Orthopaedic Surgery | Admitting: Orthopaedic Surgery

## 2015-09-15 DIAGNOSIS — M5030 Other cervical disc degeneration, unspecified cervical region: Secondary | ICD-10-CM

## 2015-09-15 DIAGNOSIS — M542 Cervicalgia: Secondary | ICD-10-CM

## 2015-10-01 ENCOUNTER — Other Ambulatory Visit: Payer: Self-pay | Admitting: Neurology

## 2015-10-30 DIAGNOSIS — Z79899 Other long term (current) drug therapy: Secondary | ICD-10-CM | POA: Diagnosis not present

## 2015-11-13 ENCOUNTER — Encounter: Payer: Self-pay | Admitting: Neurology

## 2015-11-13 ENCOUNTER — Ambulatory Visit (INDEPENDENT_AMBULATORY_CARE_PROVIDER_SITE_OTHER): Payer: Medicare Other | Admitting: Neurology

## 2015-11-13 VITALS — BP 121/77 | HR 88 | Ht 61.0 in | Wt 234.2 lb

## 2015-11-13 DIAGNOSIS — M1612 Unilateral primary osteoarthritis, left hip: Secondary | ICD-10-CM | POA: Diagnosis not present

## 2015-11-13 DIAGNOSIS — E669 Obesity, unspecified: Secondary | ICD-10-CM

## 2015-11-13 DIAGNOSIS — G43009 Migraine without aura, not intractable, without status migrainosus: Secondary | ICD-10-CM | POA: Diagnosis not present

## 2015-11-13 DIAGNOSIS — G4730 Sleep apnea, unspecified: Secondary | ICD-10-CM

## 2015-11-13 MED ORDER — DULOXETINE HCL 60 MG PO CPEP: 60.0000 mg | ORAL_CAPSULE | Freq: Every day | ORAL | 12 refills | Status: DC

## 2015-11-13 MED ORDER — NORTRIPTYLINE HCL 10 MG PO CAPS: 20.0000 mg | ORAL_CAPSULE | Freq: Every day | ORAL | 4 refills | Status: DC

## 2015-11-13 NOTE — Progress Notes (Signed)
Chief Complaint  Patient presents with  . Migraine    Reports having intermittent, sharp, shooting pains in her head over the last two weeks that are followed by a mild headache.  Her migraines have improved with nortriptyline.  . Sleep Apnea    Says she is doing well with her CPAP therapy and wears it between 9-11 hours each evening.  . Diabetic Neuropathy    The pain in her feet is much worse and gabapentin is doing very little to relieve her symptoms.      PATIENT: Sandra Brennan DOB: 05-21-1945  Chief Complaint  Patient presents with  . Migraine    Reports having intermittent, sharp, shooting pains in her head over the last two weeks that are followed by a mild headache.  Her migraines have improved with nortriptyline.  . Sleep Apnea    Says she is doing well with her CPAP therapy and wears it between 9-11 hours each evening.  . Diabetic Neuropathy    The pain in her feet is much worse and gabapentin is doing very little to relieve her symptoms.    HISTORICAL (initial visit Jul 14 2014)  Sandra Brennan is a 70 year old right-handed female, she is referred by her primary care physician Dr. Vincente Liberty for evaluation of headaches  She has history of HTN, DM, obesity, multiple joint disease, status post left hip replacement, bilateral knee replacement, she had a PhD degree, used to work as a Oncologist, went on disability due to her chronic low back pain, multiple joint disease, lives with her mother, is the main caregiver of her mother  She reported history of migraine around age 48s,  holoacranial severe pounding headaches with associated light noise sensitivity, which has much improved over the years  Since 2015, she began to suffer different kind of headaches, multiple episodes in the day, transient sharp sticky pain starting her left vertex region, spreading to left temporal region, sometimes radiating to her left neck, lasting few seconds, to 5-10 minutes, she  denies visual loss.  She denied chewing difficulty, but diffuse muscle achiness, and bilateral temporal area muscle weakness upon deep palpitation.   She complains of gradual onset memory loss since 2015, word finding difficulties, she was able to manage her household bills without difficulty, never had drive license.  She complains of trouble swallowing, using water to wash down her food, acid reflux, failed to improved by Prilosec in the past, she denies trouble chewing.  UPDATE June 15th 2016: We have reviewed MRI scan of brain in August 03 2014, tiny solitary right frontal periventricular white matter hyperintensity with differential discussed above. Incidental findings of chronic paranasal sinusitis and low lying cerebellar tonsils also noted.  Laboratory showed normal B12 307,C-reactive protein, CPK, CMP, with exception of mild elevate creat 1.32. CBC, negative RPR.  She was started on Cymbalta, presented to emergency room in Jul 15 2014 few hours after she started taking Cymbalta, as if she is going to pass out,difficulty breathing.  She continues to complains of headache, shooting pain through her head, transient,mostly on left temporal region, vertex area,   She also complains of chronic low back pain, bilateral feet paresthesia, had orthopedic evaluation, per patient, not a surgical candidate, evidence of lumbar degenerative disc disease.  She had a history of obstructive sleep apnea, was given CPAP machine, with nighttime oxygen, but she has difficulty complying with the machine, has not using it for many years, complains of poor sleep quality, snoring, frequent awakening,  excessive daytime sleepiness, and fatigue.  UPDATE Sept 12 2017: Her migraine has much improved she is taking nortriptyline 10 mg 2 tablets every night, she sleeps better with CPAP machine, no significant side effect noticed, continue complains of knee pain, hip pain, unable to tolerate gabapentin 600 mg twice each day,  higher dose caused sleepiness,  She also complains of neck pain radiating pain to right shoulder, has severe right rotator cuff disease,  We have personally reviewed MRI cervical spine in July 2017, straightening of cervical lordosis, multilevel degenerative disc disease, no evidence of canal stenosis, multilevel foraminal stenosis mild to moderate degree, no evidence of cervical root compression.   REVIEW OF SYSTEMS: Full 14 system review of systems performed and notable only for as above   ALLERGIES: Allergies  Allergen Reactions  . Cymbalta [Duloxetine Hcl] Anaphylaxis  . Penicillins Hives  . Sulfa Antibiotics Other (See Comments)    REACTION: unknown  . Theophyllines Hives    HOME MEDICATIONS: Current Outpatient Prescriptions  Medication Sig Dispense Refill  . albuterol (PROVENTIL HFA;VENTOLIN HFA) 108 (90 BASE) MCG/ACT inhaler Inhale 2 puffs into the lungs every 6 (six) hours as needed for wheezing.    Marland Kitchen aspirin EC 81 MG tablet Take 81 mg by mouth daily.    Marland Kitchen atorvastatin (LIPITOR) 10 MG tablet     . colchicine 0.6 MG tablet Take 0.6 mg by mouth 2 (two) times daily.    . cycloSPORINE (RESTASIS) 0.05 % ophthalmic emulsion Place 1 drop into both eyes 2 (two) times daily.    . diclofenac (VOLTAREN) 50 MG EC tablet Take 50 mg by mouth 2 (two) times daily.  4  . gabapentin (NEURONTIN) 600 MG tablet     . HYDROcodone-acetaminophen (NORCO) 7.5-325 MG per tablet Take 1 tablet by mouth every 6 (six) hours as needed. for pain  0  . metFORMIN (GLUCOPHAGE-XR) 500 MG 24 hr tablet Take 500 mg by mouth every morning.  4  . nortriptyline (PAMELOR) 10 MG capsule TAKE 2 CAPSULES BY MOUTH EVERY DAY AT BEDTIME 120 capsule 0  . potassium chloride SA (K-DUR,KLOR-CON) 20 MEQ tablet Take 20 mEq by mouth 2 (two) times daily.    Marland Kitchen triamterene-hydrochlorothiazide (MAXZIDE-25) 37.5-25 MG per tablet Take 1 tablet by mouth daily.    . Vitamin D, Ergocalciferol, (DRISDOL) 50000 UNITS CAPS capsule Take  50,000 Units by mouth once a week.  4   No current facility-administered medications for this visit.     PAST MEDICAL HISTORY: Past Medical History:  Diagnosis Date  . Anemia   . Arthritis    "plenty" (04-Aug-2012)  . Asthma   . Chronic lower back pain    "they say I need a whole new spinal column" (08/04/12)  . COPD (chronic obstructive pulmonary disease) ([place])   . Degenerative arthritis    "all over" (Aug 04, 2012)  . GERD (gastroesophageal reflux disease)   . Gout 05/05/2013  . Hypertension   . Migraines    "used to; totally stopped when I quit drinking 30 yr ago" (08/04/12)  . Myalgia and myositis   . Myocardial infarction [facility]) 1992   04-Aug-2012 "mild MI when son died "  . Obesity   . Osteoporosis    "all over" (08/04/2012)  . Shortness of breath    when stressed.  . Sleep apnea    "never did fix my machine; haven't used one for 5 years; I've lost 116# since then; no problems now" (08/04/2012)  . Type II diabetes mellitus ([place])    "  borderline; don't test; take Metformin" (08/03/2012)    PAST SURGICAL HISTORY: Past Surgical History:  Procedure Laterality Date  . ABDOMINAL HYSTERECTOMY  1990's  . GANGLION CYST EXCISION Right 1980's   "wrist" (08/03/2012)  . JOINT REPLACEMENT    . KNEE LIGAMENT RECONSTRUCTION Right 1980's  . TONSILLECTOMY  ~ 1954  . TOTAL HIP ARTHROPLASTY Left 08/03/2012  . TOTAL HIP ARTHROPLASTY Left 08/03/2012   Procedure: TOTAL HIP ARTHROPLASTY;  Surgeon: Garald Balding, MD;  Location: [place];  Service: Orthopedics;  Laterality: Left;  Left Total Hip Arthroplasty  . TOTAL HIP ARTHROPLASTY Left 08/28/2012   Procedure: Irrigation and Debridement hip ;  Surgeon: Mcarthur Rossetti, MD;  Location: [place];  Service: Orthopedics;  Laterality: Left;  . TOTAL KNEE ARTHROPLASTY Left 2001  . TOTAL KNEE ARTHROPLASTY Right 2004    FAMILY HISTORY: Family History  Problem Relation Age of Onset  . Arthritis Mother   . Arthritis Father   . Diabetes Sister   . Arthritis  Sister   . Diabetes Maternal Grandmother   . Arthritis Maternal Grandmother   . Diabetes Maternal Grandfather   . Arthritis Maternal Grandfather   . Colon cancer Father     SOCIAL HISTORY:  History   Social History  . Marital Status: Widowed    Spouse Name: N/A  . Number of Children: 5  . Years of Education: PhD   Occupational History  . Retired Pharmacist, hospital    Social History Main Topics  . Smoking status: Former Smoker -- 0.12 packs/day for 2 years    Types: Cigarettes  . Smokeless tobacco: Never Used     Comment: 08/03/2012 "quit 30 years ago"  . Alcohol Use: No     Comment: 08/03/2012 "used to be a beeralcoholic"; stopped ~ 30 yr ago"  . Drug Use: No  . Sexual Activity: No   Other Topics Concern  . Not on file   Social History Narrative   Lives at home with her mother and caregiver.   Occasional use of caffeine.   Right-handed.    PHYSICAL EXAM   Vitals:   11/13/15 1455  BP: 121/77  Pulse: 88  Weight: 234 lb 4 oz (106.3 kg)  Height: 5\' 1"  (1.549 m)    Not recorded      Body mass index is 44.26 kg/m.  PHYSICAL EXAMNIATION:  Gen: NAD, conversant, well nourised, obese, well groomed                     Cardiovascular: Regular rate rhythm, no peripheral edema, warm, nontender. Eyes: Conjunctivae clear without exudates or hemorrhage Neck: Supple, no carotid bruise. Pulmonary: Clear to auscultation bilaterally   NEUROLOGICAL EXAM:  MENTAL STATUS: Speech:    Speech is normal; fluent and spontaneous with normal comprehension.  Cognition:    The patient is oriented to person, place, and time;     recent and remote memory intact;     language fluent;     normal attention, concentration,     fund of knowledge.  CRANIAL NERVES: CN II: Visual fields are full to confrontation. Fundoscopic exam is normal with sharp discs and no vascular changes. Pupil equal round reactive to light. CN III, IV, VI: extraocular movement are normal. No ptosis. CN V: Facial  sensation is intact to pinprick in all 3 divisions bilaterally. Corneal responses are intact.  CN VII: Face is symmetric with normal eye closure and smile. CN VIII: Hearing is normal to rubbing fingers CN IX, X:  Palate elevates symmetrically. Phonation is normal. CN XI: Head turning and shoulder shrug are intact CN XII: Tongue is midline with normal movements and no atrophy.  MOTOR: There is no pronator drift of out-stretched arms. Muscle bulk and tone are normal. Muscle strength is normal. Wear right ankle brace  REFLEXES: Reflexes are 1 and symmetric at the biceps, triceps, knees, and absent ankles. Plantar responses are flexor.  SENSORY: Length dependent decreased, pinprick, position sense, and vibration sense to ankle level  COORDINATION: Rapid alternating movements and fine finger movements are intact. There is no dysmetria on finger-to-nose,  heel-knee-shin, limited due to knee pain GAIT/STANCE: Need to push up from seated position, antalgic, dragging her right leg, unsteady   DIAGNOSTIC DATA (LABS, IMAGING, TESTING) - I reviewed patient records, labs, notes, testing and imaging myself where available.  Lab Results  Component Value Date   WBC 4.8 07/15/2014   HGB 11.6 (L) 07/15/2014   HCT 36.4 07/15/2014   MCV 89.2 07/15/2014   PLT 198 07/15/2014      Component Value Date/Time   NA 139 07/15/2014 2208   NA 142 07/14/2014 1014   K 3.7 07/15/2014 2208   CL 103 07/15/2014 2208   CO2 23 07/15/2014 2208   GLUCOSE 109 (H) 07/15/2014 2208   BUN 12 07/15/2014 2208   BUN 12 07/14/2014 1014   CREATININE 1.32 (H) 07/15/2014 2208   CALCIUM 9.3 07/15/2014 2208   PROT 6.7 07/15/2014 2208   PROT 6.9 07/14/2014 1014   ALBUMIN 3.5 07/15/2014 2208   ALBUMIN 4.3 07/14/2014 1014   AST 36 07/15/2014 2208   ALT 25 07/15/2014 2208   ALKPHOS 82 07/15/2014 2208   BILITOT 0.4 07/15/2014 2208   BILITOT 0.2 07/14/2014 1014   GFRNONAA 41 (L) 07/15/2014 2208   GFRAA 47 (L) 07/15/2014  2208   No results found for: CHOL, HDL, LDLCALC, LDLDIRECT, TRIG, CHOLHDL Lab Results  Component Value Date   HGBA1C 5.8 (H) 08/03/2012      ASSESSMENT AND PLAN  Sandra Brennan is a 70 y.o. female  Chronic migraine headache  Refilled her nortriptyline 10 mg 2 tablets every night Cervical degenerative disc disease, chronic neck pain,  No evidence of cervical myelopathy or cervical radiculopathy,  Severe right rotator cuff disease  Obstructive sleep apnea  tolerating CPAP machine  Diffuse body achy pain, evidence of peripheral neuropathy  Add on Cymbalta 60 mg every morning  Limited to gabapentin to 60 mg every night  Only return to clinic for new issues  Marcial Pacas, M.D. Ph.D.  [facility] [address] [place], [place] [address] Ph: [phone] Fax: [phone]

## 2016-01-11 ENCOUNTER — Encounter (HOSPITAL_COMMUNITY): Payer: Self-pay | Admitting: *Deleted

## 2016-01-11 ENCOUNTER — Emergency Department (HOSPITAL_COMMUNITY)
Admission: EM | Admit: 2016-01-11 | Discharge: 2016-01-12 | Disposition: A | Discharge status: Home / Self Care | Payer: Medicare Other | Attending: Emergency Medicine | Admitting: Emergency Medicine

## 2016-01-11 DIAGNOSIS — Z79899 Other long term (current) drug therapy: Secondary | ICD-10-CM | POA: Insufficient documentation

## 2016-01-11 DIAGNOSIS — M7989 Other specified soft tissue disorders: Secondary | ICD-10-CM | POA: Insufficient documentation

## 2016-01-11 DIAGNOSIS — Z7984 Long term (current) use of oral hypoglycemic drugs: Secondary | ICD-10-CM | POA: Insufficient documentation

## 2016-01-11 DIAGNOSIS — M545 Low back pain: Secondary | ICD-10-CM | POA: Diagnosis present

## 2016-01-11 DIAGNOSIS — I10 Essential (primary) hypertension: Secondary | ICD-10-CM | POA: Diagnosis not present

## 2016-01-11 DIAGNOSIS — Z87891 Personal history of nicotine dependence: Secondary | ICD-10-CM | POA: Diagnosis not present

## 2016-01-11 DIAGNOSIS — Z96642 Presence of left artificial hip joint: Secondary | ICD-10-CM | POA: Diagnosis not present

## 2016-01-11 DIAGNOSIS — E119 Type 2 diabetes mellitus without complications: Secondary | ICD-10-CM | POA: Insufficient documentation

## 2016-01-11 DIAGNOSIS — Z96653 Presence of artificial knee joint, bilateral: Secondary | ICD-10-CM | POA: Insufficient documentation

## 2016-01-11 DIAGNOSIS — I252 Old myocardial infarction: Secondary | ICD-10-CM | POA: Diagnosis not present

## 2016-01-11 DIAGNOSIS — G8929 Other chronic pain: Secondary | ICD-10-CM | POA: Diagnosis not present

## 2016-01-11 DIAGNOSIS — Z7982 Long term (current) use of aspirin: Secondary | ICD-10-CM | POA: Insufficient documentation

## 2016-01-11 DIAGNOSIS — M5441 Lumbago with sciatica, right side: Secondary | ICD-10-CM | POA: Diagnosis not present

## 2016-01-11 DIAGNOSIS — J449 Chronic obstructive pulmonary disease, unspecified: Secondary | ICD-10-CM | POA: Insufficient documentation

## 2016-01-11 DIAGNOSIS — M79661 Pain in right lower leg: Secondary | ICD-10-CM

## 2016-01-11 MED ORDER — FENTANYL CITRATE (PF) 100 MCG/2ML IJ SOLN
50.0000 ug | Freq: Once | INTRAMUSCULAR | Status: AC
  Administered 2016-01-12: 50 ug via INTRAVENOUS
  Filled 2016-01-11: qty 2

## 2016-01-11 NOTE — ED Provider Notes (Signed)
[facility] DEPT Provider Note   CSN: [id number] Arrival date & time: 01/11/16  2027     History   Chief Complaint Chief Complaint  Patient presents with  . Leg Pain    HPI Sandra Brennan is a 70 y.o. female.  HPI   Patient is a 70 year old female with history of arthritis, chronic lower back pain, degenerative arthritis, gout, hypertension, COPD, MI, type 2 diabetes, she presents emergency Department with complaint of severe right leg pain that began yesterday. She states the pain began in her right mid foot, has gradually worsened with associated swelling. Pain radiates from her right foot and ankle to her right hip and is described as an electrical pain.  She has chronic back pain, no change from baseline.  She reports having chronic swelling in her right lower extremity, worse today. Yesterday before pain began she did a lot of walking, but she denies injury or strain.  Her pain is worse if she elevates her legs.  She's had no relief with home narcotic pain medicine.  She denies fever, chest pain and shortness of breath.  Past Medical History:  Diagnosis Date  . Anemia   . Arthritis    "plenty" (08-22-2012)  . Asthma   . Chronic lower back pain    "they say I need a whole new spinal column" (08-22-12)  . COPD (chronic obstructive pulmonary disease) ([place])   . Degenerative arthritis    "all over" (08/22/12)  . GERD (gastroesophageal reflux disease)   . Gout 05/05/2013  . Hypertension   . Migraines    "used to; totally stopped when I quit drinking 30 yr ago" (August 22, 2012)  . Myalgia and myositis   . Myocardial infarction 1992   2012-08-22 "mild MI when son died "  . Obesity   . Osteoporosis    "all over" (August 22, 2012)  . Shortness of breath    when stressed.  . Sleep apnea    "never did fix my machine; haven't used one for 5 years; I've lost 116# since then; no problems now" (August 22, 2012)  . Type II diabetes mellitus ([place])    "borderline; don't test; take Metformin" (Aug 22, 2012)     Patient Active Problem List   Diagnosis Date Noted  . Obesity 05/06/2013  . Dehydration 05/05/2013  . Viral gastroenteritis 05/05/2013  . Volume depletion, gastrointestinal loss 05/05/2013  . Acute nontraumatic kidney injury ([place]) 05/05/2013  . Acute posthemorrhagic anemia 09/06/2012  . Gout 09/05/2012  . Postoperative wound infection of left hip 08/27/2012  . Osteoarthritis of left hip 08/05/2012  . Diabetes mellitus type 2 in obese ([facility]) 08/05/2012  . Hypertension 08/05/2012  . Asthma, chronic 08/05/2012  . Sleep apnea 08/05/2012    Past Surgical History:  Procedure Laterality Date  . ABDOMINAL HYSTERECTOMY  1990's  . GANGLION CYST EXCISION Right 1980's   "wrist" (08-22-12)  . JOINT REPLACEMENT    . KNEE LIGAMENT RECONSTRUCTION Right 1980's  . TONSILLECTOMY  ~ 1954  . TOTAL HIP ARTHROPLASTY Left 2012-08-22  . TOTAL HIP ARTHROPLASTY Left 08-22-2012   Procedure: TOTAL HIP ARTHROPLASTY;  Surgeon: Garald Balding, MD;  Location: [facility];  Service: Orthopedics;  Laterality: Left;  Left Total Hip Arthroplasty  . TOTAL HIP ARTHROPLASTY Left 08/28/2012   Procedure: Irrigation and Debridement hip ;  Surgeon: Mcarthur Rossetti, MD;  Location: [facility];  Service: Orthopedics;  Laterality: Left;  . TOTAL KNEE ARTHROPLASTY Left 2001  . TOTAL KNEE ARTHROPLASTY Right 2004    OB History  No data available       Home Medications    Prior to Admission medications   Medication Sig Start Date End Date Taking? Authorizing Provider  albuterol (PROVENTIL HFA;VENTOLIN HFA) 108 (90 BASE) MCG/ACT inhaler Inhale 2 puffs into the lungs every 6 (six) hours as needed for wheezing.    Historical Provider, MD  aspirin EC 81 MG tablet Take 81 mg by mouth daily.    Historical Provider, MD  atorvastatin (LIPITOR) 10 MG tablet  05/31/15   Historical Provider, MD  colchicine 0.6 MG tablet Take 0.6 mg by mouth 2 (two) times daily.    Historical Provider, MD  cyclobenzaprine (FLEXERIL) 5 MG tablet  Take 1 tablet (5 mg total) by mouth 3 (three) times daily as needed for muscle spasms. 01/12/16   Delsa Grana, PA-C  cycloSPORINE (RESTASIS) 0.05 % ophthalmic emulsion Place 1 drop into both eyes 2 (two) times daily.    Historical Provider, MD  diclofenac (VOLTAREN) 50 MG EC tablet Take 50 mg by mouth 2 (two) times daily. 09/26/14   Historical Provider, MD  DULoxetine (CYMBALTA) 60 MG capsule Take 1 capsule (60 mg total) by mouth daily. 11/13/15   Marcial Pacas, MD  gabapentin (NEURONTIN) 600 MG tablet  06/13/14   Historical Provider, MD  HYDROcodone-acetaminophen (NORCO) 7.5-325 MG per tablet Take 1 tablet by mouth every 6 (six) hours as needed. for pain 07/05/14   Historical Provider, MD  HYDROcodone-acetaminophen (NORCO/VICODIN) 5-325 MG tablet Take 1-2 tablets by mouth every 6 (six) hours as needed. 01/12/16   Delsa Grana, PA-C  metFORMIN (GLUCOPHAGE-XR) 500 MG 24 hr tablet Take 500 mg by mouth every morning. 05/09/14   Historical Provider, MD  nortriptyline (PAMELOR) 10 MG capsule Take 2 capsules (20 mg total) by mouth at bedtime. 11/13/15   Marcial Pacas, MD  potassium chloride SA (K-DUR,KLOR-CON) 20 MEQ tablet Take 20 mEq by mouth 2 (two) times daily.    Historical Provider, MD  predniSONE (DELTASONE) 20 MG tablet Take 2 tablets (40 mg total) by mouth daily. 01/12/16   Delsa Grana, PA-C  triamterene-hydrochlorothiazide (MAXZIDE-25) 37.5-25 MG per tablet Take 1 tablet by mouth daily.    Historical Provider, MD  Vitamin D, Ergocalciferol, (DRISDOL) 50000 UNITS CAPS capsule Take 50,000 Units by mouth once a week. 05/14/14   Historical Provider, MD    Family History Family History  Problem Relation Age of Onset  . Arthritis Mother   . Arthritis Father   . Colon cancer Father   . Diabetes Sister   . Arthritis Sister   . Diabetes Maternal Grandmother   . Arthritis Maternal Grandmother   . Diabetes Maternal Grandfather   . Arthritis Maternal Grandfather     Social History Social History  Substance Use  Topics  . Smoking status: Former Smoker    Packs/day: 0.12    Years: 2.00    Types: Cigarettes  . Smokeless tobacco: Never Used     Comment: 08/03/2012 "quit 30 years ago"  . Alcohol use No     Comment: 08/03/2012 "used to be a beeralcoholic"; stopped ~ 30 yr ago"     Allergies   Cymbalta [duloxetine hcl]; Penicillins; Sulfa antibiotics; and Theophyllines   Review of Systems Review of Systems  All other systems reviewed and are negative.    Physical Exam Updated Vital Signs BP 126/61   Pulse 72   Temp 97.9 F (36.6 C)   Resp 16   Ht 5\' 2"  (1.575 m)   Wt 105.8 kg  SpO2 95%   BMI 42.66 kg/m   Physical Exam  Constitutional: She is oriented to person, place, and time. She appears well-developed and well-nourished. No distress.  Chronically ill-appearing but nontoxic appearing female, appears stated age  HENT:  Head: Normocephalic and atraumatic.  Right Ear: External ear normal.  Left Ear: External ear normal.  Nose: Nose normal.  Mouth/Throat: Oropharynx is clear and moist. No oropharyngeal exudate.  Eyes: Conjunctivae and EOM are normal. Pupils are equal, round, and reactive to light. Right eye exhibits no discharge. Left eye exhibits no discharge. No scleral icterus.  Neck: Normal range of motion. Neck supple. No JVD present. No tracheal deviation present.  Cardiovascular: Normal rate, regular rhythm and intact distal pulses.   Pulmonary/Chest: Effort normal and breath sounds normal. No stridor. No respiratory distress.  Musculoskeletal: Normal range of motion. She exhibits edema and tenderness.       Right ankle: She exhibits swelling. She exhibits no deformity and normal pulse. Achilles tendon normal.       Feet:  Warmth in right ankle when compared to left Generalized ttp to right leg, including calf, thigh and to popliteal fossa, all muscle compartments soft. Right calf circumference > than L Right hip normal ROM, no crepitus Lumbar spine and lumbar paraspinal  muscles tender to palpation  Lymphadenopathy:    She has no cervical adenopathy.  Neurological: She is alert and oriented to person, place, and time. She exhibits normal muscle tone. Coordination normal.  Skin: Skin is warm and dry. No rash noted. She is not diaphoretic. No erythema. No pallor.  Psychiatric: She has a normal mood and affect. Her behavior is normal. Judgment and thought content normal.  Nursing note and vitals reviewed.    ED Treatments / Results  Labs (all labs ordered are listed, but only abnormal results are displayed) Labs Reviewed  D-DIMER, QUANTITATIVE (NOT AT [facility]) - Abnormal; Notable for the following:       Result Value   D-Dimer, Quant 1.42 (*)    All other components within normal limits  CBC WITH DIFFERENTIAL/PLATELET - Abnormal; Notable for the following:    RBC 3.63 (*)    Hemoglobin 10.7 (*)    HCT 33.5 (*)    All other components within normal limits  BASIC METABOLIC PANEL - Abnormal; Notable for the following:    Creatinine, Ser 1.53 (*)    GFR calc non Af Amer 34 (*)    GFR calc Af Amer 39 (*)    All other components within normal limits    EKG  EKG Interpretation None       Radiology Dg Ankle Complete Right  Result Date: 01/12/2016 CLINICAL DATA:  Entire right leg pain since yesterday. Right ankle pain and swelling. EXAM: RIGHT ANKLE - COMPLETE 3+ VIEW COMPARISON:  07/25/2012 FINDINGS: Severe degenerative changes in the right ankle with diffuse loss of joint space and erosions on both sides of the ankle joint spaces. Prominent degenerative changes in the intertarsal joints. Prominent osteophyte formation and loose bodies. Possible neuropathic joint. No acute fracture or dislocation is identified. Appearances are similar to previous study. Mild diffuse soft tissue swelling. Plantar calcaneal spur with calcifications in the adjacent soft tissues suggest plantar fasciitis. IMPRESSION: Severe chronic degenerative changes in the right ankle with  bone destruction and loose bodies. Changes may indicate neuropathic joint disease. Suggestion of plantar fasciitis. Soft tissue swelling. No acute fractures identified. Electronically Signed   By: Lucienne Capers M.D.   On: 01/12/2016 00:25  Procedures Procedures (including critical care time)  Medications Ordered in ED Medications  fentaNYL (SUBLIMAZE) injection 50 mcg (50 mcg Intravenous Given 01/12/16 0036)  cyclobenzaprine (FLEXERIL) tablet 5 mg (5 mg Oral Given 01/12/16 0051)  dexamethasone (DECADRON) injection 10 mg (10 mg Intravenous Given 01/12/16 0042)  ketorolac (TORADOL) 30 MG/ML injection 30 mg (30 mg Intravenous Given 01/12/16 0040)  enoxaparin (LOVENOX) injection 100 mg (100 mg Subcutaneous Given 01/12/16 0105)     Initial Impression / Assessment and Plan / ED Course  I have reviewed the triage vital signs and the nursing notes.  Pertinent labs & imaging results that were available during my care of the patient were reviewed by me and considered in my medical decision making (see chart for details).  Clinical Course   Right ankle pain and swelling and generalized right leg pain, concerning for arthritis flare versus gout versus DVT.  Acute pain and swelling, right ankle x-ray obtained and basic labs plus d-dimer.    Case discussed with Dr. Maryan Rued, who personally saw and evaluated the pt, she states pain seems consistent with her back pain and is sciatica.  The patient's d-dimer was positive at 1.42, other lab work was significant for anemia, improved from her baseline, and elevated Cr, slightly elevated from last lab work and chart which was urine half ago, CKD stage 3.  Discussed with pharmacist Jeneen Rinks Lovenox dosing, he agrees with 1 mg/kg weight-based dosing of Lovenox, will cover the patient 12-24 hours.  Results reviewed with the patient and she was given NSAIDs, steroids and pain medication. Encouraged to follow-up with her orthopedic surgeon.  She will return in  the morning for DVT study.  Final Clinical Impressions(s) / ED Diagnoses   Final diagnoses:  Chronic midline low back pain with right-sided sciatica  Pain and swelling of right lower leg    New Prescriptions New Prescriptions   CYCLOBENZAPRINE (FLEXERIL) 5 MG TABLET    Take 1 tablet (5 mg total) by mouth 3 (three) times daily as needed for muscle spasms.   HYDROCODONE-ACETAMINOPHEN (NORCO/VICODIN) 5-325 MG TABLET    Take 1-2 tablets by mouth every 6 (six) hours as needed.   PREDNISONE (DELTASONE) 20 MG TABLET    Take 2 tablets (40 mg total) by mouth daily.     Delsa Grana, PA-C 01/12/16 0630    Blanchie Dessert, MD 01/15/16 2022

## 2016-01-11 NOTE — ED Triage Notes (Signed)
The pt is c/o pain in her entire rt leg since yesterday it started while she was sitting and standing.  It started in her rt ankle and has gone up into her rt hip

## 2016-01-12 ENCOUNTER — Ambulatory Visit (HOSPITAL_COMMUNITY)
Admission: RE | Admit: 2016-01-12 | Discharge: 2016-01-12 | Disposition: A | Discharge status: Home / Self Care | Payer: Medicare Other | Source: Ambulatory Visit | Attending: Emergency Medicine | Admitting: Emergency Medicine

## 2016-01-12 ENCOUNTER — Emergency Department (HOSPITAL_COMMUNITY): Payer: Medicare Other

## 2016-01-12 ENCOUNTER — Emergency Department (HOSPITAL_COMMUNITY)
Admission: EM | Admit: 2016-01-12 | Discharge: 2016-01-12 | Disposition: A | Discharge status: Other Acute Inpatient Hospital | Payer: Medicare Other

## 2016-01-12 ENCOUNTER — Encounter (HOSPITAL_COMMUNITY): Payer: PRIVATE HEALTH INSURANCE

## 2016-01-12 DIAGNOSIS — M79604 Pain in right leg: Secondary | ICD-10-CM | POA: Insufficient documentation

## 2016-01-12 DIAGNOSIS — M79609 Pain in unspecified limb: Secondary | ICD-10-CM | POA: Diagnosis not present

## 2016-01-12 DIAGNOSIS — M7989 Other specified soft tissue disorders: Secondary | ICD-10-CM | POA: Insufficient documentation

## 2016-01-12 DIAGNOSIS — M5441 Lumbago with sciatica, right side: Secondary | ICD-10-CM | POA: Diagnosis not present

## 2016-01-12 LAB — BASIC METABOLIC PANEL
Anion gap: 8 (ref 5–15)
BUN: 15 mg/dL (ref 6–20)
CO2: 23 mmol/L (ref 22–32)
CREATININE: 1.53 mg/dL — AB (ref 0.44–1.00)
Calcium: 8.9 mg/dL (ref 8.9–10.3)
Chloride: 109 mmol/L (ref 101–111)
GFR calc Af Amer: 39 mL/min — ABNORMAL LOW (ref >60)
GFR calc non Af Amer: 34 mL/min — ABNORMAL LOW (ref >60)
Glucose, Bld: 95 mg/dL (ref 65–99)
Potassium: 3.9 mmol/L (ref 3.5–5.1)
Sodium: 140 mmol/L (ref 135–145)

## 2016-01-12 LAB — CBC WITH DIFFERENTIAL/PLATELET
Basophils Absolute: 0 10*3/uL (ref 0.0–0.1)
Basophils Relative: 0 %
EOS PCT: 4 %
Eosinophils Absolute: 0.2 10*3/uL (ref 0.0–0.7)
HEMATOCRIT: 33.5 % — AB (ref 36.0–46.0)
Hemoglobin: 10.7 g/dL — ABNORMAL LOW (ref 12.0–15.0)
LYMPHS PCT: 44 %
Lymphs Abs: 2.6 10*3/uL (ref 0.7–4.0)
MCH: 29.5 pg (ref 26.0–34.0)
MCHC: 31.9 g/dL (ref 30.0–36.0)
MCV: 92.3 fL (ref 78.0–100.0)
MONO ABS: 0.7 10*3/uL (ref 0.1–1.0)
MONOS PCT: 12 %
NEUTROS ABS: 2.3 10*3/uL (ref 1.7–7.7)
Neutrophils Relative %: 40 %
Platelets: 229 10*3/uL (ref 150–400)
RBC: 3.63 MIL/uL — ABNORMAL LOW (ref 3.87–5.11)
RDW: 14.9 % (ref 11.5–15.5)
WBC: 5.9 10*3/uL (ref 4.0–10.5)

## 2016-01-12 LAB — D-DIMER, QUANTITATIVE: D-Dimer, Quant: 1.42 ug/mL-FEU — ABNORMAL HIGH (ref 0.00–0.50)

## 2016-01-12 MED ORDER — DEXAMETHASONE SODIUM PHOSPHATE 10 MG/ML IJ SOLN
10.0000 mg | Freq: Once | INTRAMUSCULAR | Status: AC
  Administered 2016-01-12: 10 mg via INTRAVENOUS
  Filled 2016-01-12: qty 1

## 2016-01-12 MED ORDER — CYCLOBENZAPRINE HCL 5 MG PO TABS: 5.0000 mg | ORAL_TABLET | Freq: Three times a day (TID) | ORAL | 0 refills | Status: DC | PRN

## 2016-01-12 MED ORDER — HYDROCODONE-ACETAMINOPHEN 5-325 MG PO TABS: 1.0000 | ORAL_TABLET | Freq: Four times a day (QID) | ORAL | 0 refills | Status: DC | PRN

## 2016-01-12 MED ORDER — ENOXAPARIN SODIUM 100 MG/ML ~~LOC~~ SOLN
100.0000 mg | Freq: Once | SUBCUTANEOUS | Status: AC
  Administered 2016-01-12: 100 mg via SUBCUTANEOUS
  Filled 2016-01-12: qty 1

## 2016-01-12 MED ORDER — KETOROLAC TROMETHAMINE 30 MG/ML IJ SOLN
30.0000 mg | Freq: Once | INTRAMUSCULAR | Status: AC
  Administered 2016-01-12: 30 mg via INTRAVENOUS
  Filled 2016-01-12: qty 1

## 2016-01-12 MED ORDER — CYCLOBENZAPRINE HCL 10 MG PO TABS
5.0000 mg | ORAL_TABLET | Freq: Once | ORAL | Status: AC
  Administered 2016-01-12: 5 mg via ORAL
  Filled 2016-01-12: qty 1

## 2016-01-12 MED ORDER — PREDNISONE 20 MG PO TABS: 40.0000 mg | ORAL_TABLET | Freq: Every day | ORAL | 0 refills | Status: DC

## 2016-01-12 NOTE — Progress Notes (Signed)
VASCULAR LAB PRELIMINARY  PRELIMINARY  PRELIMINARY  PRELIMINARY  Right lower extremity venous duplex completed.    Preliminary report:  There is no obvious evidence of DVT or SVT noted in the visualized veins of the right lower extremity.  Dakoda Laventure, RVT 01/12/2016, 12:47 PM

## 2016-01-12 NOTE — ED Notes (Signed)
Pt transported to xray 

## 2016-01-12 NOTE — Discharge Instructions (Signed)
The xray of your ankle shows the following:  "Severe chronic degenerative changes in the right ankle with bone destruction and loose bodies. Changes may indicate neuropathic joint disease. Suggestion of plantar fasciitis. Soft tissue swelling. No acute fractures identified.:  Information was provided about arthritis and plantar fasciitis. Your pain may all be secondary to your known back problems.  We have added a muscle relaxer to your pain regimen.  Also a short steroid burst.  The d-dimer test was high, which means you need to return for the DVT study to definitely rule out a venous blood clot.  Info was provided about this, and you received a blood thinner shot that will begin treating the blood clot if you happen to have one.  This prevents it from getting larger or traveling to your lungs which can be serious.  Return tomorrow for the test to further evaluate.

## 2016-01-12 NOTE — ED Notes (Signed)
Pt back from x-ray.

## 2016-01-12 NOTE — ED Notes (Signed)
Pt told to return in the AM for f/u with vascular.

## 2016-02-11 ENCOUNTER — Other Ambulatory Visit (HOSPITAL_COMMUNITY): Payer: Self-pay | Admitting: Pulmonary Disease

## 2016-02-11 DIAGNOSIS — E041 Nontoxic single thyroid nodule: Secondary | ICD-10-CM

## 2016-02-11 DIAGNOSIS — E559 Vitamin D deficiency, unspecified: Secondary | ICD-10-CM | POA: Diagnosis not present

## 2016-02-11 DIAGNOSIS — E119 Type 2 diabetes mellitus without complications: Secondary | ICD-10-CM | POA: Diagnosis not present

## 2016-02-11 DIAGNOSIS — E7800 Pure hypercholesterolemia, unspecified: Secondary | ICD-10-CM | POA: Diagnosis not present

## 2016-02-11 DIAGNOSIS — I119 Hypertensive heart disease without heart failure: Secondary | ICD-10-CM | POA: Diagnosis not present

## 2016-02-11 DIAGNOSIS — Z79899 Other long term (current) drug therapy: Secondary | ICD-10-CM | POA: Diagnosis not present

## 2016-02-22 ENCOUNTER — Ambulatory Visit (HOSPITAL_COMMUNITY)
Admission: RE | Admit: 2016-02-22 | Discharge: 2016-02-22 | Disposition: A | Discharge status: Home / Self Care | Payer: Medicare Other | Source: Ambulatory Visit | Attending: Pulmonary Disease | Admitting: Pulmonary Disease

## 2016-02-22 DIAGNOSIS — E041 Nontoxic single thyroid nodule: Secondary | ICD-10-CM

## 2016-03-30 ENCOUNTER — Emergency Department (HOSPITAL_COMMUNITY): Payer: Medicare Other

## 2016-03-30 ENCOUNTER — Encounter (HOSPITAL_COMMUNITY): Payer: Self-pay | Admitting: Emergency Medicine

## 2016-03-30 ENCOUNTER — Observation Stay (HOSPITAL_COMMUNITY): Payer: Medicare Other

## 2016-03-30 ENCOUNTER — Observation Stay (HOSPITAL_COMMUNITY)
Admission: EM | Admit: 2016-03-30 | Discharge: 2016-04-01 | Disposition: A | Discharge status: Home Health | Payer: Medicare Other | Attending: Family Medicine | Admitting: Family Medicine

## 2016-03-30 DIAGNOSIS — E1122 Type 2 diabetes mellitus with diabetic chronic kidney disease: Secondary | ICD-10-CM | POA: Diagnosis not present

## 2016-03-30 DIAGNOSIS — G8929 Other chronic pain: Secondary | ICD-10-CM | POA: Diagnosis not present

## 2016-03-30 DIAGNOSIS — N179 Acute kidney failure, unspecified: Secondary | ICD-10-CM | POA: Insufficient documentation

## 2016-03-30 DIAGNOSIS — Z7952 Long term (current) use of systemic steroids: Secondary | ICD-10-CM | POA: Insufficient documentation

## 2016-03-30 DIAGNOSIS — I129 Hypertensive chronic kidney disease with stage 1 through stage 4 chronic kidney disease, or unspecified chronic kidney disease: Secondary | ICD-10-CM | POA: Insufficient documentation

## 2016-03-30 DIAGNOSIS — S0181XA Laceration without foreign body of other part of head, initial encounter: Secondary | ICD-10-CM

## 2016-03-30 DIAGNOSIS — J449 Chronic obstructive pulmonary disease, unspecified: Secondary | ICD-10-CM | POA: Diagnosis not present

## 2016-03-30 DIAGNOSIS — Z880 Allergy status to penicillin: Secondary | ICD-10-CM | POA: Diagnosis not present

## 2016-03-30 DIAGNOSIS — Z794 Long term (current) use of insulin: Secondary | ICD-10-CM | POA: Insufficient documentation

## 2016-03-30 DIAGNOSIS — Z6837 Body mass index (BMI) 37.0-37.9, adult: Secondary | ICD-10-CM | POA: Insufficient documentation

## 2016-03-30 DIAGNOSIS — N183 Chronic kidney disease, stage 3 (moderate): Secondary | ICD-10-CM | POA: Diagnosis not present

## 2016-03-30 DIAGNOSIS — M1990 Unspecified osteoarthritis, unspecified site: Secondary | ICD-10-CM | POA: Insufficient documentation

## 2016-03-30 DIAGNOSIS — E1169 Type 2 diabetes mellitus with other specified complication: Secondary | ICD-10-CM | POA: Diagnosis not present

## 2016-03-30 DIAGNOSIS — E11649 Type 2 diabetes mellitus with hypoglycemia without coma: Secondary | ICD-10-CM | POA: Diagnosis not present

## 2016-03-30 DIAGNOSIS — G4730 Sleep apnea, unspecified: Secondary | ICD-10-CM | POA: Diagnosis not present

## 2016-03-30 DIAGNOSIS — I252 Old myocardial infarction: Secondary | ICD-10-CM | POA: Insufficient documentation

## 2016-03-30 DIAGNOSIS — I2510 Atherosclerotic heart disease of native coronary artery without angina pectoris: Secondary | ICD-10-CM | POA: Diagnosis not present

## 2016-03-30 DIAGNOSIS — I10 Essential (primary) hypertension: Secondary | ICD-10-CM | POA: Diagnosis not present

## 2016-03-30 DIAGNOSIS — M810 Age-related osteoporosis without current pathological fracture: Secondary | ICD-10-CM | POA: Insufficient documentation

## 2016-03-30 DIAGNOSIS — K219 Gastro-esophageal reflux disease without esophagitis: Secondary | ICD-10-CM | POA: Insufficient documentation

## 2016-03-30 DIAGNOSIS — G4733 Obstructive sleep apnea (adult) (pediatric): Secondary | ICD-10-CM | POA: Diagnosis present

## 2016-03-30 DIAGNOSIS — Z96642 Presence of left artificial hip joint: Secondary | ICD-10-CM | POA: Diagnosis not present

## 2016-03-30 DIAGNOSIS — Z7982 Long term (current) use of aspirin: Secondary | ICD-10-CM | POA: Insufficient documentation

## 2016-03-30 DIAGNOSIS — E669 Obesity, unspecified: Secondary | ICD-10-CM | POA: Insufficient documentation

## 2016-03-30 DIAGNOSIS — I951 Orthostatic hypotension: Principal | ICD-10-CM | POA: Insufficient documentation

## 2016-03-30 DIAGNOSIS — R55 Syncope and collapse: Secondary | ICD-10-CM | POA: Insufficient documentation

## 2016-03-30 DIAGNOSIS — Z96653 Presence of artificial knee joint, bilateral: Secondary | ICD-10-CM | POA: Diagnosis not present

## 2016-03-30 DIAGNOSIS — M545 Low back pain: Secondary | ICD-10-CM | POA: Diagnosis not present

## 2016-03-30 DIAGNOSIS — W19XXXA Unspecified fall, initial encounter: Secondary | ICD-10-CM | POA: Diagnosis not present

## 2016-03-30 DIAGNOSIS — R531 Weakness: Secondary | ICD-10-CM | POA: Insufficient documentation

## 2016-03-30 DIAGNOSIS — M109 Gout, unspecified: Secondary | ICD-10-CM | POA: Diagnosis not present

## 2016-03-30 DIAGNOSIS — Z87891 Personal history of nicotine dependence: Secondary | ICD-10-CM | POA: Insufficient documentation

## 2016-03-30 DIAGNOSIS — R079 Chest pain, unspecified: Secondary | ICD-10-CM

## 2016-03-30 DIAGNOSIS — Z79899 Other long term (current) drug therapy: Secondary | ICD-10-CM | POA: Insufficient documentation

## 2016-03-30 DIAGNOSIS — E1142 Type 2 diabetes mellitus with diabetic polyneuropathy: Secondary | ICD-10-CM | POA: Diagnosis present

## 2016-03-30 DIAGNOSIS — E785 Hyperlipidemia, unspecified: Secondary | ICD-10-CM | POA: Diagnosis not present

## 2016-03-30 DIAGNOSIS — Z833 Family history of diabetes mellitus: Secondary | ICD-10-CM | POA: Insufficient documentation

## 2016-03-30 DIAGNOSIS — Z8261 Family history of arthritis: Secondary | ICD-10-CM | POA: Insufficient documentation

## 2016-03-30 HISTORY — DX: Chest pain, unspecified: R07.9

## 2016-03-30 HISTORY — DX: Syncope and collapse: R55

## 2016-03-30 LAB — CREATININE, SERUM
CREATININE: 1.47 mg/dL — AB (ref 0.44–1.00)
GFR calc Af Amer: 41 mL/min — ABNORMAL LOW (ref >60)
GFR calc non Af Amer: 35 mL/min — ABNORMAL LOW (ref >60)

## 2016-03-30 LAB — GLUCOSE, CAPILLARY
GLUCOSE-CAPILLARY: 55 mg/dL — AB (ref 65–99)
Glucose-Capillary: 75 mg/dL (ref 65–99)
Glucose-Capillary: 97 mg/dL (ref 65–99)

## 2016-03-30 LAB — COMPREHENSIVE METABOLIC PANEL
ALBUMIN: 3.3 g/dL — AB (ref 3.5–5.0)
ALK PHOS: 123 U/L (ref 38–126)
ALT: 44 U/L (ref 14–54)
AST: 46 U/L — ABNORMAL HIGH (ref 15–41)
Anion gap: 9 (ref 5–15)
BUN: 15 mg/dL (ref 6–20)
CALCIUM: 9.1 mg/dL (ref 8.9–10.3)
CHLORIDE: 110 mmol/L (ref 101–111)
CO2: 23 mmol/L (ref 22–32)
CREATININE: 1.52 mg/dL — AB (ref 0.44–1.00)
GFR calc non Af Amer: 34 mL/min — ABNORMAL LOW (ref >60)
GFR, EST AFRICAN AMERICAN: 40 mL/min — AB (ref >60)
GLUCOSE: 85 mg/dL (ref 65–99)
Potassium: 3.8 mmol/L (ref 3.5–5.1)
SODIUM: 142 mmol/L (ref 135–145)
Total Bilirubin: 0.6 mg/dL (ref 0.3–1.2)
Total Protein: 6.2 g/dL — ABNORMAL LOW (ref 6.5–8.1)

## 2016-03-30 LAB — CBC WITH DIFFERENTIAL/PLATELET
Basophils Absolute: 0 10*3/uL (ref 0.0–0.1)
Basophils Relative: 0 %
Eosinophils Absolute: 0.1 10*3/uL (ref 0.0–0.7)
Eosinophils Relative: 3 %
HCT: 34.4 % — ABNORMAL LOW (ref 36.0–46.0)
HEMOGLOBIN: 10.7 g/dL — AB (ref 12.0–15.0)
LYMPHS ABS: 1.4 10*3/uL (ref 0.7–4.0)
LYMPHS PCT: 27 %
MCH: 28.8 pg (ref 26.0–34.0)
MCHC: 31.1 g/dL (ref 30.0–36.0)
MCV: 92.7 fL (ref 78.0–100.0)
MONOS PCT: 11 %
Monocytes Absolute: 0.6 10*3/uL (ref 0.1–1.0)
Neutro Abs: 3.1 10*3/uL (ref 1.7–7.7)
Neutrophils Relative %: 59 %
Platelets: 182 10*3/uL (ref 150–400)
RBC: 3.71 MIL/uL — AB (ref 3.87–5.11)
RDW: 15.4 % (ref 11.5–15.5)
WBC: 5.3 10*3/uL (ref 4.0–10.5)

## 2016-03-30 LAB — URINALYSIS, ROUTINE W REFLEX MICROSCOPIC
Bilirubin Urine: NEGATIVE
GLUCOSE, UA: NEGATIVE mg/dL
HGB URINE DIPSTICK: NEGATIVE
Ketones, ur: NEGATIVE mg/dL
Leukocytes, UA: NEGATIVE
Nitrite: NEGATIVE
PH: 5 (ref 5.0–8.0)
Protein, ur: NEGATIVE mg/dL
SPECIFIC GRAVITY, URINE: 1.012 (ref 1.005–1.030)

## 2016-03-30 LAB — CBC
HCT: 34.2 % — ABNORMAL LOW (ref 36.0–46.0)
Hemoglobin: 10.8 g/dL — ABNORMAL LOW (ref 12.0–15.0)
MCH: 29.8 pg (ref 26.0–34.0)
MCHC: 31.6 g/dL (ref 30.0–36.0)
MCV: 94.5 fL (ref 78.0–100.0)
PLATELETS: 141 10*3/uL — AB (ref 150–400)
RBC: 3.62 MIL/uL — ABNORMAL LOW (ref 3.87–5.11)
RDW: 15.8 % — AB (ref 11.5–15.5)
WBC: 5.6 10*3/uL (ref 4.0–10.5)

## 2016-03-30 LAB — LIPID PANEL
CHOL/HDL RATIO: 1.7 ratio
Cholesterol: 115 mg/dL (ref 0–200)
HDL: 68 mg/dL (ref >40)
LDL CALC: 37 mg/dL (ref 0–99)
Triglycerides: 51 mg/dL (ref <150)
VLDL: 10 mg/dL (ref 0–40)

## 2016-03-30 LAB — TROPONIN I
Troponin I: <0.03 ng/mL (ref <0.03)
Troponin I: <0.03 ng/mL (ref <0.03)
Troponin I: <0.03 ng/mL (ref <0.03)

## 2016-03-30 LAB — INFLUENZA PANEL BY PCR (TYPE A & B)
INFLAPCR: NEGATIVE
Influenza B By PCR: NEGATIVE

## 2016-03-30 MED ORDER — HEPARIN SODIUM (PORCINE) 5000 UNIT/ML IJ SOLN
5000.0000 [IU] | Freq: Three times a day (TID) | INTRAMUSCULAR | Status: DC
  Administered 2016-03-30 – 2016-04-01 (×5): 5000 [IU] via SUBCUTANEOUS
  Filled 2016-03-30 (×6): qty 1

## 2016-03-30 MED ORDER — CARBAMIDE PEROXIDE 6.5 % OT SOLN
5.0000 [drp] | Freq: Two times a day (BID) | OTIC | Status: DC
  Administered 2016-03-30 – 2016-04-01 (×4): 5 [drp] via OTIC
  Filled 2016-03-30 (×2): qty 15

## 2016-03-30 MED ORDER — HYDROCODONE-ACETAMINOPHEN 5-325 MG PO TABS: 1.0000 | ORAL_TABLET | Freq: Four times a day (QID) | ORAL | Status: DC | PRN

## 2016-03-30 MED ORDER — GI COCKTAIL ~~LOC~~: 30.0000 mL | Freq: Four times a day (QID) | ORAL | Status: DC | PRN

## 2016-03-30 MED ORDER — HYDROCODONE-ACETAMINOPHEN 7.5-325 MG PO TABS
1.0000 | ORAL_TABLET | Freq: Four times a day (QID) | ORAL | Status: DC | PRN
  Administered 2016-03-30 – 2016-03-31 (×4): 1 via ORAL
  Filled 2016-03-30 (×4): qty 1

## 2016-03-30 MED ORDER — SODIUM CHLORIDE 0.9% FLUSH
3.0000 mL | Freq: Two times a day (BID) | INTRAVENOUS | Status: DC
  Administered 2016-03-30 – 2016-04-01 (×4): 3 mL via INTRAVENOUS

## 2016-03-30 MED ORDER — ALBUTEROL SULFATE (2.5 MG/3ML) 0.083% IN NEBU: 2.5000 mg | INHALATION_SOLUTION | Freq: Four times a day (QID) | RESPIRATORY_TRACT | Status: DC | PRN

## 2016-03-30 MED ORDER — TRIAMTERENE-HCTZ 37.5-25 MG PO TABS: 1.0000 | ORAL_TABLET | Freq: Every day | ORAL | Status: DC

## 2016-03-30 MED ORDER — ACETAMINOPHEN 325 MG PO TABS
650.0000 mg | ORAL_TABLET | Freq: Four times a day (QID) | ORAL | Status: DC | PRN
  Administered 2016-03-31 – 2016-04-01 (×2): 650 mg via ORAL
  Filled 2016-03-30: qty 2

## 2016-03-30 MED ORDER — COLCHICINE 0.6 MG PO TABS
0.6000 mg | ORAL_TABLET | Freq: Two times a day (BID) | ORAL | Status: DC
  Administered 2016-03-30 – 2016-04-01 (×4): 0.6 mg via ORAL
  Filled 2016-03-30 (×3): qty 1

## 2016-03-30 MED ORDER — GABAPENTIN 600 MG PO TABS
600.0000 mg | ORAL_TABLET | Freq: Two times a day (BID) | ORAL | Status: DC
  Administered 2016-03-30 – 2016-04-01 (×4): 600 mg via ORAL
  Filled 2016-03-30 (×4): qty 1

## 2016-03-30 MED ORDER — POTASSIUM CHLORIDE CRYS ER 20 MEQ PO TBCR
20.0000 meq | EXTENDED_RELEASE_TABLET | Freq: Two times a day (BID) | ORAL | Status: DC
  Administered 2016-03-31 – 2016-04-01 (×3): 20 meq via ORAL
  Filled 2016-03-30 (×4): qty 1

## 2016-03-30 MED ORDER — ONDANSETRON HCL 4 MG/2ML IJ SOLN: 4.0000 mg | Freq: Four times a day (QID) | INTRAMUSCULAR | Status: DC | PRN

## 2016-03-30 MED ORDER — ZOLPIDEM TARTRATE 5 MG PO TABS: 5.0000 mg | ORAL_TABLET | Freq: Every evening | ORAL | Status: DC | PRN

## 2016-03-30 MED ORDER — ACETAMINOPHEN 650 MG RE SUPP: 650.0000 mg | Freq: Four times a day (QID) | RECTAL | Status: DC | PRN

## 2016-03-30 MED ORDER — ONDANSETRON HCL 4 MG PO TABS
4.0000 mg | ORAL_TABLET | Freq: Four times a day (QID) | ORAL | Status: DC | PRN
Start: 2016-03-30 — End: 2016-04-01

## 2016-03-30 MED ORDER — SODIUM CHLORIDE 0.9 % IV SOLN
INTRAVENOUS | Status: DC
  Administered 2016-03-30 – 2016-03-31 (×2): via INTRAVENOUS

## 2016-03-30 MED ORDER — DULOXETINE HCL 60 MG PO CPEP: 60.0000 mg | ORAL_CAPSULE | Freq: Every day | ORAL | Status: DC

## 2016-03-30 MED ORDER — ASPIRIN EC 81 MG PO TBEC
81.0000 mg | DELAYED_RELEASE_TABLET | Freq: Every day | ORAL | Status: DC
  Administered 2016-03-31 – 2016-04-01 (×2): 81 mg via ORAL
  Filled 2016-03-30 (×2): qty 1

## 2016-03-30 MED ORDER — CYCLOBENZAPRINE HCL 10 MG PO TABS: 5.0000 mg | ORAL_TABLET | Freq: Three times a day (TID) | ORAL | Status: DC | PRN

## 2016-03-30 MED ORDER — NORTRIPTYLINE HCL 10 MG PO CAPS
20.0000 mg | ORAL_CAPSULE | Freq: Every day | ORAL | Status: DC
  Administered 2016-03-30 – 2016-03-31 (×2): 20 mg via ORAL
  Filled 2016-03-30 (×2): qty 2

## 2016-03-30 MED ORDER — COLCHICINE 0.6 MG PO TABS
0.6000 mg | ORAL_TABLET | Freq: Two times a day (BID) | ORAL | Status: DC
  Filled 2016-03-30: qty 1

## 2016-03-30 MED ORDER — VITAMIN D (ERGOCALCIFEROL) 1.25 MG (50000 UNIT) PO CAPS: 50000.0000 [IU] | ORAL_CAPSULE | ORAL | Status: DC

## 2016-03-30 MED ORDER — ATORVASTATIN CALCIUM 10 MG PO TABS
10.0000 mg | ORAL_TABLET | Freq: Every day | ORAL | Status: DC
  Administered 2016-03-30 – 2016-03-31 (×2): 10 mg via ORAL
  Filled 2016-03-30 (×3): qty 1

## 2016-03-30 MED ORDER — POLYETHYLENE GLYCOL 3350 17 G PO PACK: 17.0000 g | PACK | Freq: Every day | ORAL | Status: DC | PRN

## 2016-03-30 MED ORDER — INSULIN ASPART 100 UNIT/ML ~~LOC~~ SOLN: 0.0000 [IU] | Freq: Three times a day (TID) | SUBCUTANEOUS | Status: DC

## 2016-03-30 NOTE — ED Triage Notes (Signed)
[place] with c[place]  Head and back pain after a fall , pt did have chest pain prior to the fall but none now , pos loc

## 2016-03-30 NOTE — ED Notes (Signed)
Myself and Ida, NT assisted [place], RN with removing patient off the LSB; patient undressed, in gown, on monitor, continuous pulse oximetry and blood pressure cuff; visitors at bedside

## 2016-03-30 NOTE — ED Notes (Signed)
Assisted Lanny Hurst, EMT with in and out cath for urine sample; Lanny Hurst, EMT obtained urine sample successfully and sent to lab for testing; patient is resting and no needs at this time

## 2016-03-30 NOTE — ED Notes (Signed)
Patient transported to CT 

## 2016-03-30 NOTE — Progress Notes (Signed)
Medications held d/t medications to close together once pt returned from MRI.

## 2016-03-30 NOTE — ED Notes (Signed)
Report called  

## 2016-03-30 NOTE — ED Notes (Signed)
Patient transported to X-ray 

## 2016-03-30 NOTE — ED Notes (Signed)
Returned from ct scan 

## 2016-03-30 NOTE — ED Provider Notes (Signed)
[facility] DEPT Provider Note   CSN: [id number] Arrival date & time: 03/30/16  [id number]     History   Chief Complaint Chief Complaint  Patient presents with  . Fall  . Chest Pain    HPI Sandra Brennan is a 71 y.o. female.  HPI  Reportedly had walked down some stairs and then suddenly felt a sharp chest pain. Felt lightheaded and felt as if she  Could not move her legs. Fell down striking her head. Was unconscious. Woke up without seizure activity. Complaining of mild pain or head and neck. Pain in her chest had improved. No extremity pain. No abdominal pain.she states that she felt lightheaded after the pain started. Does not feel like her previous heart attack.   Past Medical History:  Diagnosis Date  . Anemia   . Arthritis    "plenty" (08-07-12)  . Asthma   . Chronic lower back pain    "they say I need a whole new spinal column" (08-07-12)  . COPD (chronic obstructive pulmonary disease) ([place])   . Degenerative arthritis    "all over" (08-07-2012)  . GERD (gastroesophageal reflux disease)   . Gout 05/05/2013  . Hypertension   . Migraines    "used to; totally stopped when I quit drinking 30 yr ago" (08/07/2012)  . Myalgia and myositis   . Myocardial infarction 1992   2012-08-07 "mild MI when son died "  . Obesity   . Osteoporosis    "all over" (07-Aug-2012)  . Shortness of breath    when stressed.  . Sleep apnea    "never did fix my machine; haven't used one for 5 years; I've lost 116# since then; no problems now" (08/07/12)  . Type II diabetes mellitus ([place])    "borderline; don't test; take Metformin" (08-07-2012)    Patient Active Problem List   Diagnosis Date Noted  . Obesity 05/06/2013  . Dehydration 05/05/2013  . Viral gastroenteritis 05/05/2013  . Volume depletion, gastrointestinal loss 05/05/2013  . Acute nontraumatic kidney injury ([place]) 05/05/2013  . Acute posthemorrhagic anemia 09/06/2012  . Gout 09/05/2012  . Postoperative wound infection of left hip  08/27/2012  . Osteoarthritis of left hip 08/05/2012  . Diabetes mellitus type 2 in obese ([place]) 08/05/2012  . Hypertension 08/05/2012  . Asthma, chronic 08/05/2012  . Sleep apnea 08/05/2012    Past Surgical History:  Procedure Laterality Date  . ABDOMINAL HYSTERECTOMY  1990's  . GANGLION CYST EXCISION Right 1980's   "wrist" (08/07/12)  . JOINT REPLACEMENT    . KNEE LIGAMENT RECONSTRUCTION Right 1980's  . TONSILLECTOMY  ~ 1954  . TOTAL HIP ARTHROPLASTY Left 08-07-2012  . TOTAL HIP ARTHROPLASTY Left 08/07/2012   Procedure: TOTAL HIP ARTHROPLASTY;  Surgeon: Garald Balding, MD;  Location: [facility];  Service: Orthopedics;  Laterality: Left;  Left Total Hip Arthroplasty  . TOTAL HIP ARTHROPLASTY Left 08/28/2012   Procedure: Irrigation and Debridement hip ;  Surgeon: Mcarthur Rossetti, MD;  Location: [facility];  Service: Orthopedics;  Laterality: Left;  . TOTAL KNEE ARTHROPLASTY Left 2001  . TOTAL KNEE ARTHROPLASTY Right 2004    OB History    No data available       Home Medications    Prior to Admission medications   Medication Sig Start Date End Date Taking? Authorizing Provider  albuterol (PROVENTIL HFA;VENTOLIN HFA) 108 (90 BASE) MCG/ACT inhaler Inhale 2 puffs into the lungs every 6 (six) hours as needed for wheezing.    Historical Provider, MD  aspirin EC 81 MG tablet Take 81 mg by mouth daily.    Historical Provider, MD  atorvastatin (LIPITOR) 10 MG tablet  05/31/15   Historical Provider, MD  colchicine 0.6 MG tablet Take 0.6 mg by mouth 2 (two) times daily.    Historical Provider, MD  cyclobenzaprine (FLEXERIL) 5 MG tablet Take 1 tablet (5 mg total) by mouth 3 (three) times daily as needed for muscle spasms. 01/12/16   Delsa Grana, PA-C  cycloSPORINE (RESTASIS) 0.05 % ophthalmic emulsion Place 1 drop into both eyes 2 (two) times daily.    Historical Provider, MD  diclofenac (VOLTAREN) 50 MG EC tablet Take 50 mg by mouth 2 (two) times daily. 09/26/14   Historical Provider, MD    DULoxetine (CYMBALTA) 60 MG capsule Take 1 capsule (60 mg total) by mouth daily. 11/13/15   Marcial Pacas, MD  gabapentin (NEURONTIN) 600 MG tablet  06/13/14   Historical Provider, MD  HYDROcodone-acetaminophen (NORCO) 7.5-325 MG per tablet Take 1 tablet by mouth every 6 (six) hours as needed. for pain 07/05/14   Historical Provider, MD  HYDROcodone-acetaminophen (NORCO/VICODIN) 5-325 MG tablet Take 1-2 tablets by mouth every 6 (six) hours as needed. 01/12/16   Delsa Grana, PA-C  metFORMIN (GLUCOPHAGE-XR) 500 MG 24 hr tablet Take 500 mg by mouth every morning. 05/09/14   Historical Provider, MD  nortriptyline (PAMELOR) 10 MG capsule Take 2 capsules (20 mg total) by mouth at bedtime. 11/13/15   Marcial Pacas, MD  potassium chloride SA (K-DUR,KLOR-CON) 20 MEQ tablet Take 20 mEq by mouth 2 (two) times daily.    Historical Provider, MD  predniSONE (DELTASONE) 20 MG tablet Take 2 tablets (40 mg total) by mouth daily. 01/12/16   Delsa Grana, PA-C  triamterene-hydrochlorothiazide (MAXZIDE-25) 37.5-25 MG per tablet Take 1 tablet by mouth daily.    Historical Provider, MD  Vitamin D, Ergocalciferol, (DRISDOL) 50000 UNITS CAPS capsule Take 50,000 Units by mouth once a week. 05/14/14   Historical Provider, MD    Family History Family History  Problem Relation Age of Onset  . Arthritis Mother   . Arthritis Father   . Colon cancer Father   . Diabetes Sister   . Arthritis Sister   . Diabetes Maternal Grandmother   . Arthritis Maternal Grandmother   . Diabetes Maternal Grandfather   . Arthritis Maternal Grandfather     Social History Social History  Substance Use Topics  . Smoking status: Former Smoker    Packs/day: 0.12    Years: 2.00    Types: Cigarettes  . Smokeless tobacco: Never Used     Comment: 08/03/2012 "quit 30 years ago"  . Alcohol use No     Comment: 08/03/2012 "used to be a beeralcoholic"; stopped ~ 30 yr ago"     Allergies   Cymbalta [duloxetine hcl]; Penicillins; Sulfa antibiotics; and  Theophyllines   Review of Systems Review of Systems  Constitutional: Negative for appetite change.  HENT: Negative for congestion.   Respiratory: Negative for chest tightness and shortness of breath.   Cardiovascular: Positive for chest pain.  Gastrointestinal: Negative for abdominal pain.  Genitourinary: Negative for flank pain.  Musculoskeletal: Positive for neck pain. Negative for back pain.  Skin: Positive for wound.  Neurological: Positive for syncope.  Hematological: Negative for adenopathy.     Physical Exam Updated Vital Signs BP 160/87   Pulse 66   Temp 97.9 F (36.6 C) (Oral)   Resp 10   SpO2 99%   Physical Exam  Constitutional: She appears well-developed.  HENT:  Head: Normocephalic.  1.5 cm polyp approximately laceration above right eyebrow.  Eyes: EOM are normal.  Neck:  Some mild midline tenderness on cervical spine. No deformity.  Cardiovascular: Normal rate.   Pulmonary/Chest: Effort normal.  Abdominal: Soft.  Musculoskeletal: She exhibits no edema.  Neurological: She is alert.  Skin: Skin is warm. Capillary refill takes less than 2 seconds.     ED Treatments / Results  Labs (all labs ordered are listed, but only abnormal results are displayed) Labs Reviewed  COMPREHENSIVE METABOLIC PANEL - Abnormal; Notable for the following:       Result Value   Creatinine, Ser 1.52 (*)    Total Protein 6.2 (*)    Albumin 3.3 (*)    AST 46 (*)    GFR calc non Af Amer 34 (*)    GFR calc Af Amer 40 (*)    All other components within normal limits  CBC WITH DIFFERENTIAL/PLATELET - Abnormal; Notable for the following:    RBC 3.71 (*)    Hemoglobin 10.7 (*)    HCT 34.4 (*)    All other components within normal limits  TROPONIN I  URINALYSIS, ROUTINE W REFLEX MICROSCOPIC    EKG  EKG Interpretation  Date/Time:  Sunday March 30 2016 09:37:19 EST Ventricular Rate:  78 PR Interval:    QRS Duration: 102 QT Interval:  408 QTC Calculation: 465 R  Axis:   15 Text Interpretation:  Sinus rhythm Probable left ventricular hypertrophy Confirmed by Alvino Chapel  MD, Ovid Curd [phone]) on 03/30/2016 9:45:59 AM       Radiology Dg Chest 2 View  Result Date: 03/30/2016 CLINICAL DATA:  Patient with syncopal episode and church. EXAM: CHEST  2 VIEW COMPARISON:  Chest radiograph 07/15/2014. FINDINGS: Normal cardiac and mediastinal contours. No consolidative pulmonary opacities. No pleural effusion or pneumothorax. Thoracic spine degenerative changes. Extensive bilateral glenohumeral joint degenerative changes/AVN. IMPRESSION: No active cardiopulmonary disease. Electronically Signed   By: Lovey Newcomer M.D.   On: 03/30/2016 10:24   Ct Head Wo Contrast  Result Date: 03/30/2016 CLINICAL DATA:  Status post fall with neck stiffness and possible loss of consciousness. EXAM: CT HEAD WITHOUT CONTRAST CT CERVICAL SPINE WITHOUT CONTRAST TECHNIQUE: Multidetector CT imaging of the head and cervical spine was performed following the standard protocol without intravenous contrast. Multiplanar CT image reconstructions of the cervical spine were also generated. COMPARISON:  MRI of the cervical spine 09/15/2015, CT of the head 11/17/2008 FINDINGS: CT HEAD FINDINGS Brain: No evidence of acute intracranial hemorrhage, hydrocephalus, extra-axial collection or mass lesion/mass effect. There is diffusely hypoattenuated appearance of the right temporal lobe. Vascular: Calcific atherosclerotic disease at the skullbase. Skull: Normal. Negative for fracture or focal lesion. Sinuses/Orbits: Minimal opacification of the right mastoid air cells. Mild mucosal thickening of the bilateral ethmoid air persists. Other: None. CT CERVICAL SPINE FINDINGS Alignment: Reversal of physiologic lordosis, likely due to extensive osteoarthritic changes. Skull base and vertebrae: No acute fracture. No primary bone lesion or focal pathologic process. Soft tissues and spinal canal: No prevertebral fluid or swelling.  No visible canal hematoma. Disc levels: Multilevel osteoarthritic changes with narrowing of the disc spaces, endplate sclerosis, degenerative remodeling of vertebral bodies. Light congenital ankylosis of C2-C3. Upper chest: Negative. Other: None. IMPRESSION: No evidence of acute intracranial injury. Diffusely hypoattenuated appearance of the right temple lower lobe. This portion of the brain is prone to artifacts and therefore this may be artifactual, however ischemic infarction cannot be excluded. If clinical symptoms support possible infarction  of the right temporal lobe, MRI of the brain should be considered. No evidence of acute traumatic injury to the cervical spine. Straightening of cervical lordosis with extensive multilevel osteoarthritic changes and segmentation anomaly at C2-C3. Electronically Signed   By: Fidela Salisbury M.D.   On: 03/30/2016 11:14   Ct Cervical Spine Wo Contrast  Result Date: 03/30/2016 CLINICAL DATA:  Status post fall with neck stiffness and possible loss of consciousness. EXAM: CT HEAD WITHOUT CONTRAST CT CERVICAL SPINE WITHOUT CONTRAST TECHNIQUE: Multidetector CT imaging of the head and cervical spine was performed following the standard protocol without intravenous contrast. Multiplanar CT image reconstructions of the cervical spine were also generated. COMPARISON:  MRI of the cervical spine 09/15/2015, CT of the head 11/17/2008 FINDINGS: CT HEAD FINDINGS Brain: No evidence of acute intracranial hemorrhage, hydrocephalus, extra-axial collection or mass lesion/mass effect. There is diffusely hypoattenuated appearance of the right temporal lobe. Vascular: Calcific atherosclerotic disease at the skullbase. Skull: Normal. Negative for fracture or focal lesion. Sinuses/Orbits: Minimal opacification of the right mastoid air cells. Mild mucosal thickening of the bilateral ethmoid air persists. Other: None. CT CERVICAL SPINE FINDINGS Alignment: Reversal of physiologic lordosis,  likely due to extensive osteoarthritic changes. Skull base and vertebrae: No acute fracture. No primary bone lesion or focal pathologic process. Soft tissues and spinal canal: No prevertebral fluid or swelling. No visible canal hematoma. Disc levels: Multilevel osteoarthritic changes with narrowing of the disc spaces, endplate sclerosis, degenerative remodeling of vertebral bodies. Light congenital ankylosis of C2-C3. Upper chest: Negative. Other: None. IMPRESSION: No evidence of acute intracranial injury. Diffusely hypoattenuated appearance of the right temple lower lobe. This portion of the brain is prone to artifacts and therefore this may be artifactual, however ischemic infarction cannot be excluded. If clinical symptoms support possible infarction of the right temporal lobe, MRI of the brain should be considered. No evidence of acute traumatic injury to the cervical spine. Straightening of cervical lordosis with extensive multilevel osteoarthritic changes and segmentation anomaly at C2-C3. Electronically Signed   By: Fidela Salisbury M.D.   On: 03/30/2016 11:14    Procedures Procedures (including critical care time)  Medications Ordered in ED Medications - No data to display   Initial Impression / Assessment and Plan / ED Course  I have reviewed the triage vital signs and the nursing notes.  Pertinent labs & imaging results that were available during my care of the patient were reviewed by me and considered in my medical decision making (see chart for details).      Patient was syncope. Was at church and had some chest pain followed by lightheadedness passing out. EKG reassuring but does have cardiac history. Labs overall reassuring. Wound on head will be closed. Nonspecific changes on CT do not clinically fit in with the chest pain. Will admit to internal medicine.  Final Clinical Impressions(s) / ED Diagnoses   Final diagnoses:  Chest pain, unspecified type  Syncope, unspecified  syncope type  Fall, initial encounter  Laceration of forehead, initial encounter    New Prescriptions New Prescriptions   No medications on file     Davonna Belling, MD 03/30/16 1300

## 2016-03-30 NOTE — H&P (Signed)
History and Physical    Sandra Brennan [id number] DOB: 10/20/1945 DOA: 03/30/2016  PCP: Leola Brazil, MD   Patient coming from: church  Chief Complaint: chest pian HPI: Sandra Brennan is a 71 y.o. female with medical history significant of previous CAD with MI, DM type II, HTN, HLD who presented to the ED after she sustained a fall at church earlier this morning. Patient reported going downstairs all of a sudden severe lower mid-chest pain located  substernal ly. She described pain as severe squeezing sensation radiating to the left neck, left jaw line and associated with profound cold sweats and nausea. Pain was so severe the patient felt like her knees gave out and she falling trying to grab on stair railing to prevent falling, but still landed face down and sustained a small laceration of the right side of the forehead above the right eyebrow [facility] called EMS and patient was delivered to the emergency department  ED Course: I went to the emergency department her vital signs were stable, blood work was notable for mild anemia with hemoglobin 10.7, creatinine of 1.52, troponin was normal, EKG showed normal sinus rhythm, no T-wave abnormality. Chest x-ray showed no active cardiopulmonary disease. Patient had cervical spine and head CT that revealed straightening of the overdoses with multilevel osteoarthritic changes, no acute traumatic injury to the cervical spine, right temple had diffuse attenuation that could represent artifact versus gimmick infarction and MRI of the brain was recommended   Review of Systems: As per HPI otherwise 10 point review of systems negative.   Ambulatory Status: Ambulates independently, but gait is unstable and she has been prone to falling. This time is her second fall during the last 4 months  Past Medical History:  Diagnosis Date  . Anemia   . Arthritis    "plenty" (08-05-12)  . Asthma   . Chronic lower back pain    "they say I need a whole  new spinal column" (Aug 05, 2012)  . COPD (chronic obstructive pulmonary disease) ([place])   . Degenerative arthritis    "all over" (05-Aug-2012)  . GERD (gastroesophageal reflux disease)   . Gout 05/05/2013  . Hypertension   . Migraines    "used to; totally stopped when I quit drinking 30 yr ago" (2012/08/05)  . Myalgia and myositis   . Myocardial infarction 1992   2012-08-05 "mild MI when son died "  . Obesity   . Osteoporosis    "all over" (08/05/12)  . Shortness of breath    when stressed.  . Sleep apnea    "never did fix my machine; haven't used one for 5 years; I've lost 116# since then; no problems now" (2012/08/05)  . Type II diabetes mellitus ([place])    "borderline; don't test; take Metformin" (2012/08/05)    Past Surgical History:  Procedure Laterality Date  . ABDOMINAL HYSTERECTOMY  1990's  . GANGLION CYST EXCISION Right 1980's   "wrist" (2012-08-05)  . JOINT REPLACEMENT    . KNEE LIGAMENT RECONSTRUCTION Right 1980's  . TONSILLECTOMY  ~ 1954  . TOTAL HIP ARTHROPLASTY Left 05-Aug-2012  . TOTAL HIP ARTHROPLASTY Left 08-05-2012   Procedure: TOTAL HIP ARTHROPLASTY;  Surgeon: Garald Balding, MD;  Location: [facility];  Service: Orthopedics;  Laterality: Left;  Left Total Hip Arthroplasty  . TOTAL HIP ARTHROPLASTY Left 08/28/2012   Procedure: Irrigation and Debridement hip ;  Surgeon: Mcarthur Rossetti, MD;  Location: [facility];  Service: Orthopedics;  Laterality: Left;  . TOTAL KNEE ARTHROPLASTY  Left 2001  . TOTAL KNEE ARTHROPLASTY Right 2004    Social History   Social History  . Marital status: Widowed    Spouse name: N/A  . Number of children: 5  . Years of education: PhD   Occupational History  . Retired    Social History Main Topics  . Smoking status: Former Smoker    Packs/day: 0.12    Years: 2.00    Types: Cigarettes  . Smokeless tobacco: Never Used     Comment: 08/03/2012 "quit 30 years ago"  . Alcohol use No     Comment: 08/03/2012 "used to be a beeralcoholic"; stopped ~ 30 yr  ago"  . Drug use: No  . Sexual activity: No   Other Topics Concern  . Not on file   Social History Narrative   Lives at home with her mother and caregiver.   Occasional use of caffeine.   Right-handed.    Allergies  Allergen Reactions  . Cymbalta [Duloxetine Hcl] Anaphylaxis  . Penicillins Hives  . Sulfa Antibiotics Other (See Comments)    REACTION: unknown  . Theophyllines Hives    Family History  Problem Relation Age of Onset  . Arthritis Mother   . Arthritis Father   . Colon cancer Father   . Diabetes Sister   . Arthritis Sister   . Diabetes Maternal Grandmother   . Arthritis Maternal Grandmother   . Diabetes Maternal Grandfather   . Arthritis Maternal Grandfather     Prior to Admission medications   Medication Sig Start Date End Date Taking? Authorizing Provider  albuterol (PROVENTIL HFA;VENTOLIN HFA) 108 (90 BASE) MCG/ACT inhaler Inhale 2 puffs into the lungs every 6 (six) hours as needed for wheezing.    Historical Provider, MD  aspirin EC 81 MG tablet Take 81 mg by mouth daily.    Historical Provider, MD  atorvastatin (LIPITOR) 10 MG tablet  05/31/15   Historical Provider, MD  colchicine 0.6 MG tablet Take 0.6 mg by mouth 2 (two) times daily.    Historical Provider, MD  cyclobenzaprine (FLEXERIL) 5 MG tablet Take 1 tablet (5 mg total) by mouth 3 (three) times daily as needed for muscle spasms. 01/12/16   Delsa Grana, PA-C  cycloSPORINE (RESTASIS) 0.05 % ophthalmic emulsion Place 1 drop into both eyes 2 (two) times daily.    Historical Provider, MD  diclofenac (VOLTAREN) 50 MG EC tablet Take 50 mg by mouth 2 (two) times daily. 09/26/14   Historical Provider, MD  DULoxetine (CYMBALTA) 60 MG capsule Take 1 capsule (60 mg total) by mouth daily. 11/13/15   Marcial Pacas, MD  gabapentin (NEURONTIN) 600 MG tablet  06/13/14   Historical Provider, MD  HYDROcodone-acetaminophen (NORCO) 7.5-325 MG per tablet Take 1 tablet by mouth every 6 (six) hours as needed. for pain 07/05/14    Historical Provider, MD  HYDROcodone-acetaminophen (NORCO/VICODIN) 5-325 MG tablet Take 1-2 tablets by mouth every 6 (six) hours as needed. 01/12/16   Delsa Grana, PA-C  metFORMIN (GLUCOPHAGE-XR) 500 MG 24 hr tablet Take 500 mg by mouth every morning. 05/09/14   Historical Provider, MD  nortriptyline (PAMELOR) 10 MG capsule Take 2 capsules (20 mg total) by mouth at bedtime. 11/13/15   Marcial Pacas, MD  potassium chloride SA (K-DUR,KLOR-CON) 20 MEQ tablet Take 20 mEq by mouth 2 (two) times daily.    Historical Provider, MD  predniSONE (DELTASONE) 20 MG tablet Take 2 tablets (40 mg total) by mouth daily. 01/12/16   Delsa Grana, PA-C  triamterene-hydrochlorothiazide (MAXZIDE-25) 37.5-25  MG per tablet Take 1 tablet by mouth daily.    Historical Provider, MD  Vitamin D, Ergocalciferol, (DRISDOL) 50000 UNITS CAPS capsule Take 50,000 Units by mouth once a week. 05/14/14   Historical Provider, MD    Physical Exam: Vitals:   03/30/16 1230 03/30/16 1245 03/30/16 1300 03/30/16 1345  BP: 151/87 145/84 151/82 144/80  Pulse:      Resp: 14 18 14 18   Temp:      TempSrc:      SpO2:         General: Appears calm and comfortable Eyes: PERRLA, EOMI, normal lids, iris ENT:  grossly normal hearing, right hear has wax build up, lips & tongue, mucous membranes moist and intact Neck: no lymphoadenopathy, masses or thyromegaly Cardiovascular: RRR, no m/r/g. No JVD, carotid bruits. No LE edema.  Respiratory: bilateral no wheezes, rales, rhonchi or cracles. Normal respiratory effort. No accessory muscle use observed Abdomen: soft, non-tender, non-distended, no organomegaly or masses appreciated. BS present in all quadrants Skin: no rash, ulcers or induration seen on limited exam Musculoskeletal: grossly normal tone BUE/BLE, good ROM, no bony abnormality or joint deformities observed Psychiatric: grossly normal mood and affect, speech fluent and appropriate, alert and oriented x3 Neurologic: CN II-XII grossly intact,  moves all extremities in coordinated fashion, sensation intact  Labs on Admission: I have personally reviewed following labs and imaging studies  CBC, BMP  GFR: CrCl cannot be calculated (Unknown ideal weight.).   Creatinine Clearance: CrCl cannot be calculated (Unknown ideal weight.).    Radiological Exams on Admission: Dg Chest 2 View  Result Date: 03/30/2016 CLINICAL DATA:  Patient with syncopal episode and church. EXAM: CHEST  2 VIEW COMPARISON:  Chest radiograph 07/15/2014. FINDINGS: Normal cardiac and mediastinal contours. No consolidative pulmonary opacities. No pleural effusion or pneumothorax. Thoracic spine degenerative changes. Extensive bilateral glenohumeral joint degenerative changes/AVN. IMPRESSION: No active cardiopulmonary disease. Electronically Signed   By: Lovey Newcomer M.D.   On: 03/30/2016 10:24   Ct Head Wo Contrast  Result Date: 03/30/2016 CLINICAL DATA:  Status post fall with neck stiffness and possible loss of consciousness. EXAM: CT HEAD WITHOUT CONTRAST CT CERVICAL SPINE WITHOUT CONTRAST TECHNIQUE: Multidetector CT imaging of the head and cervical spine was performed following the standard protocol without intravenous contrast. Multiplanar CT image reconstructions of the cervical spine were also generated. COMPARISON:  MRI of the cervical spine 09/15/2015, CT of the head 11/17/2008 FINDINGS: CT HEAD FINDINGS Brain: No evidence of acute intracranial hemorrhage, hydrocephalus, extra-axial collection or mass lesion/mass effect. There is diffusely hypoattenuated appearance of the right temporal lobe. Vascular: Calcific atherosclerotic disease at the skullbase. Skull: Normal. Negative for fracture or focal lesion. Sinuses/Orbits: Minimal opacification of the right mastoid air cells. Mild mucosal thickening of the bilateral ethmoid air persists. Other: None. CT CERVICAL SPINE FINDINGS Alignment: Reversal of physiologic lordosis, likely due to extensive osteoarthritic  changes. Skull base and vertebrae: No acute fracture. No primary bone lesion or focal pathologic process. Soft tissues and spinal canal: No prevertebral fluid or swelling. No visible canal hematoma. Disc levels: Multilevel osteoarthritic changes with narrowing of the disc spaces, endplate sclerosis, degenerative remodeling of vertebral bodies. Light congenital ankylosis of C2-C3. Upper chest: Negative. Other: None. IMPRESSION: No evidence of acute intracranial injury. Diffusely hypoattenuated appearance of the right temple lower lobe. This portion of the brain is prone to artifacts and therefore this may be artifactual, however ischemic infarction cannot be excluded. If clinical symptoms support possible infarction of the right temporal lobe, MRI  of the brain should be considered. No evidence of acute traumatic injury to the cervical spine. Straightening of cervical lordosis with extensive multilevel osteoarthritic changes and segmentation anomaly at C2-C3. Electronically Signed   By: Fidela Salisbury M.D.   On: 03/30/2016 11:14   Ct Cervical Spine Wo Contrast  Result Date: 03/30/2016 CLINICAL DATA:  Status post fall with neck stiffness and possible loss of consciousness. EXAM: CT HEAD WITHOUT CONTRAST CT CERVICAL SPINE WITHOUT CONTRAST TECHNIQUE: Multidetector CT imaging of the head and cervical spine was performed following the standard protocol without intravenous contrast. Multiplanar CT image reconstructions of the cervical spine were also generated. COMPARISON:  MRI of the cervical spine 09/15/2015, CT of the head 11/17/2008 FINDINGS: CT HEAD FINDINGS Brain: No evidence of acute intracranial hemorrhage, hydrocephalus, extra-axial collection or mass lesion/mass effect. There is diffusely hypoattenuated appearance of the right temporal lobe. Vascular: Calcific atherosclerotic disease at the skullbase. Skull: Normal. Negative for fracture or focal lesion. Sinuses/Orbits: Minimal opacification of the right  mastoid air cells. Mild mucosal thickening of the bilateral ethmoid air persists. Other: None. CT CERVICAL SPINE FINDINGS Alignment: Reversal of physiologic lordosis, likely due to extensive osteoarthritic changes. Skull base and vertebrae: No acute fracture. No primary bone lesion or focal pathologic process. Soft tissues and spinal canal: No prevertebral fluid or swelling. No visible canal hematoma. Disc levels: Multilevel osteoarthritic changes with narrowing of the disc spaces, endplate sclerosis, degenerative remodeling of vertebral bodies. Light congenital ankylosis of C2-C3. Upper chest: Negative. Other: None. IMPRESSION: No evidence of acute intracranial injury. Diffusely hypoattenuated appearance of the right temple lower lobe. This portion of the brain is prone to artifacts and therefore this may be artifactual, however ischemic infarction cannot be excluded. If clinical symptoms support possible infarction of the right temporal lobe, MRI of the brain should be considered. No evidence of acute traumatic injury to the cervical spine. Straightening of cervical lordosis with extensive multilevel osteoarthritic changes and segmentation anomaly at C2-C3. Electronically Signed   By: Fidela Salisbury M.D.   On: 03/30/2016 11:14    EKG: Independently reviewed - normal sinus rhythm without acute abnormalities Assessment/Plan Principal Problem:   Chest pain Active Problems:   Diabetes mellitus type 2 in obese [facility]   Hypertension   Sleep apnea   Gout   Fall   Chest pain Patient will be admitted to rule out ACS protocol If cardiac enzymes remain stable without a prior to and, she'll undergo nuclear stress test tomorrow morning  Fall secondary to unsteady gait - this fall is a second episode during the last four months She underwent head CT that showed area og hypo attenuation in the right temporal lobe, MRI is pending PT eval ordered  DM type II - check HgbA1C Hold Metformin while  hospitalized Continue diabetic diet, monitor FSBS QACHS, add sliding scale insulin  Hypertension - currently stable Continue home medication and adjust the doses if needed depending on the BP readings  Acute kidney injury superimposed on chronic kidney disease, GFR 40 and base line creatinine is 1.30 Will hold Maxzide, continue to follow renal indices after gentle hydration  Hyperlipidemia  Recheck fasting lipids Continue statin therapy and adjust the doses if needed  DVT prophylaxis: Heparin Code Status: full Family Commuication: at bedside Disposition Plan: telemetry Consults called: none Admission status: pbservation   York Grice, PA-C Pager: [phone] [facility]  If 7PM-7AM, please contact night-coverage [email or web address] Password [email or web address]  03/30/2016, 1:54 PM

## 2016-03-30 NOTE — Progress Notes (Signed)
Pt placed on droplet precautions, Family and patient educated on wearing mask while in the room and to wash hands.

## 2016-03-30 NOTE — Progress Notes (Signed)
Pt has home cpap at bedside. H2O added. Pt states she can place herself on when ready

## 2016-03-31 ENCOUNTER — Observation Stay (HOSPITAL_BASED_OUTPATIENT_CLINIC_OR_DEPARTMENT_OTHER): Payer: Medicare Other

## 2016-03-31 DIAGNOSIS — E669 Obesity, unspecified: Secondary | ICD-10-CM | POA: Diagnosis not present

## 2016-03-31 DIAGNOSIS — I10 Essential (primary) hypertension: Secondary | ICD-10-CM | POA: Diagnosis not present

## 2016-03-31 DIAGNOSIS — R55 Syncope and collapse: Secondary | ICD-10-CM

## 2016-03-31 DIAGNOSIS — I200 Unstable angina: Secondary | ICD-10-CM

## 2016-03-31 DIAGNOSIS — E1169 Type 2 diabetes mellitus with other specified complication: Secondary | ICD-10-CM | POA: Diagnosis not present

## 2016-03-31 DIAGNOSIS — R079 Chest pain, unspecified: Secondary | ICD-10-CM | POA: Diagnosis not present

## 2016-03-31 DIAGNOSIS — I951 Orthostatic hypotension: Secondary | ICD-10-CM | POA: Diagnosis not present

## 2016-03-31 LAB — NM MYOCAR MULTI W/SPECT W/WALL MOTION / EF
CHL CUP MPHR: 152 {beats}/min
CSEPEW: 1 METS
Peak HR: 88 {beats}/min
Percent HR: 57 %
Rest HR: 65 {beats}/min

## 2016-03-31 LAB — CBC
HEMATOCRIT: 28.9 % — AB (ref 36.0–46.0)
Hemoglobin: 9.1 g/dL — ABNORMAL LOW (ref 12.0–15.0)
MCH: 29.2 pg (ref 26.0–34.0)
MCHC: 31.5 g/dL (ref 30.0–36.0)
MCV: 92.6 fL (ref 78.0–100.0)
Platelets: 160 10*3/uL (ref 150–400)
RBC: 3.12 MIL/uL — ABNORMAL LOW (ref 3.87–5.11)
RDW: 15.5 % (ref 11.5–15.5)
WBC: 5 10*3/uL (ref 4.0–10.5)

## 2016-03-31 LAB — HEMOGLOBIN A1C
Hgb A1c MFr Bld: 5.4 % (ref 4.8–5.6)
Mean Plasma Glucose: 108 mg/dL

## 2016-03-31 LAB — BASIC METABOLIC PANEL
ANION GAP: 5 (ref 5–15)
BUN: 14 mg/dL (ref 6–20)
CO2: 26 mmol/L (ref 22–32)
Calcium: 8.2 mg/dL — ABNORMAL LOW (ref 8.9–10.3)
Chloride: 110 mmol/L (ref 101–111)
Creatinine, Ser: 1.56 mg/dL — ABNORMAL HIGH (ref 0.44–1.00)
GFR calc Af Amer: 38 mL/min — ABNORMAL LOW (ref >60)
GFR, EST NON AFRICAN AMERICAN: 33 mL/min — AB (ref >60)
Glucose, Bld: 99 mg/dL (ref 65–99)
Potassium: 4 mmol/L (ref 3.5–5.1)
SODIUM: 141 mmol/L (ref 135–145)

## 2016-03-31 LAB — GLUCOSE, CAPILLARY
GLUCOSE-CAPILLARY: 79 mg/dL (ref 65–99)
GLUCOSE-CAPILLARY: 114 mg/dL — AB (ref 65–99)
GLUCOSE-CAPILLARY: 96 mg/dL (ref 65–99)
Glucose-Capillary: 82 mg/dL (ref 65–99)
Glucose-Capillary: 67 mg/dL (ref 65–99)
Glucose-Capillary: 95 mg/dL (ref 65–99)

## 2016-03-31 LAB — TROPONIN I

## 2016-03-31 LAB — ECHOCARDIOGRAM COMPLETE
Height: 63 in
WEIGHTICAEL: 3400 [oz_av]

## 2016-03-31 MED ORDER — DEXTROSE-NACL 5-0.9 % IV SOLN
INTRAVENOUS | Status: DC
  Administered 2016-03-31 – 2016-04-01 (×2): via INTRAVENOUS

## 2016-03-31 MED ORDER — ACETAMINOPHEN 325 MG PO TABS
ORAL_TABLET | ORAL | Status: AC
  Filled 2016-03-31: qty 2

## 2016-03-31 MED ORDER — TECHNETIUM TC 99M TETROFOSMIN IV KIT
30.0000 | PACK | Freq: Once | INTRAVENOUS | Status: AC | PRN
  Administered 2016-03-31: 30 via INTRAVENOUS

## 2016-03-31 MED ORDER — TECHNETIUM TC 99M TETROFOSMIN IV KIT
10.0000 | PACK | Freq: Once | INTRAVENOUS | Status: AC | PRN
  Administered 2016-03-31: 10 via INTRAVENOUS

## 2016-03-31 MED ORDER — REGADENOSON 0.4 MG/5ML IV SOLN
0.4000 mg | Freq: Once | INTRAVENOUS | Status: AC
  Administered 2016-03-31: 0.4 mg via INTRAVENOUS
  Filled 2016-03-31: qty 5

## 2016-03-31 MED ORDER — PERFLUTREN LIPID MICROSPHERE
1.0000 mL | INTRAVENOUS | Status: AC | PRN
  Administered 2016-03-31: 2 mL via INTRAVENOUS
  Filled 2016-03-31: qty 10

## 2016-03-31 MED ORDER — REGADENOSON 0.4 MG/5ML IV SOLN
INTRAVENOUS | Status: AC
  Administered 2016-03-31: 0.4 mg via INTRAVENOUS
  Filled 2016-03-31: qty 5

## 2016-03-31 NOTE — Progress Notes (Signed)
[facility]  PROGRESS NOTE  Sandra Brennan [id number] DOB: [birth year] DOA: 03/30/2016 PCP: Leola Brazil, MD   Brief HPI:    71 y.o. female with a Past Medical History of coronary disease with prior myocardial infarction about 20 years ago. So, diabetes type 2, hypertension, hyperlipidemia, presents to ED after she sustained a fall at church, hitting the right temporal region of her head secondary to severe sudden onset lower mid chest pain.  Of note, she does mention neuropathy of her right lower extremity and balance issues. She is afraid of walking because of  her complaints of disequilibrium   Subjective   Patient seen and examined, denies chest pain this morning.   Assessment/Plan:     1. Syncope- patient has had 2 episodes of syncope, previous episode was 4 months ago. We will obtain echocardiogram. Check orthostatic vital signs every shift. Maxzide is currently on hold. She does complain of dizziness on getting up from sitting position. MRI brain showed no acute changes. 2. Chest pain- cardiac enzymes 3 are negative, underwent Lexiscan Myoview. Results are pending at this time. 3. Diabetes mellitus-form and is on hold, continue sliding scale insulin. 4. History of hypertension-blood pressure stable, hold  Maxzide at  this time. Monitor. 5. Acute kidney injury on chronic kidney stage III- patient and creatinine 1.3, today creatinine is 1.56. Continue gentle IV hydration with normal saline at 75 mL per hr. patient takes diclofenac at home. Which is currently on hold.    DVT prophylaxis: Heparin  Code Status: Full code  Family Communication: Discussed with family members at bedside  Disposition Plan: Pending evaluation for chest pain, syncope   Consultants: Cardiology  Procedures:  Lexiscan Myoview  Continuous infusions . dextrose 5 % and 0.9% NaCl 75 mL/hr at 03/31/16 0658      Antibiotics:   Anti-infectives    None       Objective    Vitals:   03/31/16 0907 03/31/16 0909 03/31/16 0910 03/31/16 1208  BP: 124/72 117/69 130/67 106/64  Pulse: 87 82 80 68  Resp:    18  Temp:    98 F (36.7 C)  TempSrc:    Oral  SpO2:    100%  Weight:      Height:        Intake/Output Summary (Last 24 hours) at 03/31/16 1450 Last data filed at 03/31/16 1436  Gross per 24 hour  Intake          1548.75 ml  Output              226 ml  Net          1322.75 ml   Filed Weights   03/30/16 1432 03/31/16 0608  Weight: 98.7 kg (217 lb 8 oz) 96.4 kg (212 lb 8 oz)     Physical Examination:  General exam: Appears calm and comfortable. Respiratory system: Clear to auscultation. Respiratory effort normal. Cardiovascular system:  RRR. No  murmurs, rubs, gallops. No pedal edema. GI system: Abdomen is nondistended, soft and nontender. No organomegaly.  Central nervous system. No focal neurological deficits. 5 x 5 power in all extremities. Skin: No rashes, lesions or ulcers. Psychiatry: Alert, oriented x 3.Judgement and insight appear normal. Affect normal.    Data Reviewed: I have personally reviewed following labs and imaging studies  CBG:  Recent Labs Lab 03/30/16 2104 03/31/16 0610 03/31/16 1107 03/31/16 1321 03/31/16 1419  GLUCAP 97 79 82 67 114*    CBC:  Recent Labs  Lab 03/30/16 0956 03/30/16 1440 03/31/16 0040  WBC 5.3 5.6 5.0  NEUTROABS 3.1  --   --   HGB 10.7* 10.8* 9.1*  HCT 34.4* 34.2* 28.9*  MCV 92.7 94.5 92.6  PLT 182 141* 0000000    Basic Metabolic Panel:  Recent Labs Lab 03/30/16 0956 03/30/16 1440 03/31/16 0040  NA 142  --  141  K 3.8  --  4.0  CL 110  --  110  CO2 23  --  26  GLUCOSE 85  --  99  BUN 15  --  14  CREATININE 1.52* 1.47* 1.56*  CALCIUM 9.1  --  8.2*    No results found for this or any previous visit (from the past 240 hour(s)).   Liver Function Tests:  Recent Labs Lab 03/30/16 0956  AST 46*  ALT 44  ALKPHOS 123  BILITOT 0.6  PROT 6.2*  ALBUMIN 3.3*   No results  for input(s): LIPASE, AMYLASE in the last 168 hours. No results for input(s): AMMONIA in the last 168 hours.  Cardiac Enzymes:  Recent Labs Lab 03/30/16 0956 03/30/16 1440 03/30/16 1812 03/31/16 0040  TROPONINI <0.03 <0.03 <0.03 <0.03      Studies: Dg Chest 2 View  Result Date: 03/30/2016 CLINICAL DATA:  Patient with syncopal episode and church. EXAM: CHEST  2 VIEW COMPARISON:  Chest radiograph 07/15/2014. FINDINGS: Normal cardiac and mediastinal contours. No consolidative pulmonary opacities. No pleural effusion or pneumothorax. Thoracic spine degenerative changes. Extensive bilateral glenohumeral joint degenerative changes/AVN. IMPRESSION: No active cardiopulmonary disease. Electronically Signed   By: Lovey Newcomer M.D.   On: 03/30/2016 10:24   Ct Head Wo Contrast  Result Date: 03/30/2016 CLINICAL DATA:  Status post fall with neck stiffness and possible loss of consciousness. EXAM: CT HEAD WITHOUT CONTRAST CT CERVICAL SPINE WITHOUT CONTRAST TECHNIQUE: Multidetector CT imaging of the head and cervical spine was performed following the standard protocol without intravenous contrast. Multiplanar CT image reconstructions of the cervical spine were also generated. COMPARISON:  MRI of the cervical spine 09/15/2015, CT of the head 11/17/2008 FINDINGS: CT HEAD FINDINGS Brain: No evidence of acute intracranial hemorrhage, hydrocephalus, extra-axial collection or mass lesion/mass effect. There is diffusely hypoattenuated appearance of the right temporal lobe. Vascular: Calcific atherosclerotic disease at the skullbase. Skull: Normal. Negative for fracture or focal lesion. Sinuses/Orbits: Minimal opacification of the right mastoid air cells. Mild mucosal thickening of the bilateral ethmoid air persists. Other: None. CT CERVICAL SPINE FINDINGS Alignment: Reversal of physiologic lordosis, likely due to extensive osteoarthritic changes. Skull base and vertebrae: No acute fracture. No primary bone lesion or  focal pathologic process. Soft tissues and spinal canal: No prevertebral fluid or swelling. No visible canal hematoma. Disc levels: Multilevel osteoarthritic changes with narrowing of the disc spaces, endplate sclerosis, degenerative remodeling of vertebral bodies. Light congenital ankylosis of C2-C3. Upper chest: Negative. Other: None. IMPRESSION: No evidence of acute intracranial injury. Diffusely hypoattenuated appearance of the right temple lower lobe. This portion of the brain is prone to artifacts and therefore this may be artifactual, however ischemic infarction cannot be excluded. If clinical symptoms support possible infarction of the right temporal lobe, MRI of the brain should be considered. No evidence of acute traumatic injury to the cervical spine. Straightening of cervical lordosis with extensive multilevel osteoarthritic changes and segmentation anomaly at C2-C3. Electronically Signed   By: Fidela Salisbury M.D.   On: 03/30/2016 11:14   Ct Cervical Spine Wo Contrast  Result Date: 03/30/2016 CLINICAL DATA:  Status  post fall with neck stiffness and possible loss of consciousness. EXAM: CT HEAD WITHOUT CONTRAST CT CERVICAL SPINE WITHOUT CONTRAST TECHNIQUE: Multidetector CT imaging of the head and cervical spine was performed following the standard protocol without intravenous contrast. Multiplanar CT image reconstructions of the cervical spine were also generated. COMPARISON:  MRI of the cervical spine 09/15/2015, CT of the head 11/17/2008 FINDINGS: CT HEAD FINDINGS Brain: No evidence of acute intracranial hemorrhage, hydrocephalus, extra-axial collection or mass lesion/mass effect. There is diffusely hypoattenuated appearance of the right temporal lobe. Vascular: Calcific atherosclerotic disease at the skullbase. Skull: Normal. Negative for fracture or focal lesion. Sinuses/Orbits: Minimal opacification of the right mastoid air cells. Mild mucosal thickening of the bilateral ethmoid air  persists. Other: None. CT CERVICAL SPINE FINDINGS Alignment: Reversal of physiologic lordosis, likely due to extensive osteoarthritic changes. Skull base and vertebrae: No acute fracture. No primary bone lesion or focal pathologic process. Soft tissues and spinal canal: No prevertebral fluid or swelling. No visible canal hematoma. Disc levels: Multilevel osteoarthritic changes with narrowing of the disc spaces, endplate sclerosis, degenerative remodeling of vertebral bodies. Light congenital ankylosis of C2-C3. Upper chest: Negative. Other: None. IMPRESSION: No evidence of acute intracranial injury. Diffusely hypoattenuated appearance of the right temple lower lobe. This portion of the brain is prone to artifacts and therefore this may be artifactual, however ischemic infarction cannot be excluded. If clinical symptoms support possible infarction of the right temporal lobe, MRI of the brain should be considered. No evidence of acute traumatic injury to the cervical spine. Straightening of cervical lordosis with extensive multilevel osteoarthritic changes and segmentation anomaly at C2-C3. Electronically Signed   By: Fidela Salisbury M.D.   On: 03/30/2016 11:14   Mr Brain Wo Contrast  Result Date: 03/30/2016 CLINICAL DATA:  71 y/o F; status post fall with abnormal CT of the head. EXAM: MRI HEAD WITHOUT CONTRAST TECHNIQUE: Multiplanar, multiecho pulse sequences of the brain and surrounding structures were obtained without intravenous contrast. COMPARISON:  03/30/2016 CT of the head. 08/03/2014 MRI of the brain. FINDINGS: Brain: No acute infarction, hemorrhage, hydrocephalus, extra-axial collection or mass lesion. Few scattered foci of T2 FLAIR hyperintensity in subcortical white matter are compatible with minimal chronic microvascular ischemic changes without significant interval change. Vascular: Normal flow voids. Skull and upper cervical spine: Normal marrow signal. Hyperostosis frontalis interna.  Sinuses/Orbits: Trace bilateral mastoid effusions. Mild diffuse paranasal sinus mucosal thickening greatest in the ethmoid air cells. Orbits are unremarkable. Other: None. IMPRESSION: 1. No acute intracranial abnormality. 2. No abnormal signal within the temporal lobes, hypoattenuation on CT is artifactual. 3. Minimal chronic microvascular ischemic changes without significant interval change. 4. Mild paranasal sinus disease and trace mastoid effusions. Electronically Signed   By: Kristine Garbe M.D.   On: 03/30/2016 17:35   Nm Myocar Multi W/spect W/wall Motion / Ef  Result Date: 03/31/2016 CLINICAL DATA:  71 year old with chest pain. History of myocardial infarction, diabetes and hypertension. EXAM: MYOCARDIAL IMAGING WITH SPECT (REST AND PHARMACOLOGIC-STRESS) GATED LEFT VENTRICULAR WALL MOTION STUDY LEFT VENTRICULAR EJECTION FRACTION TECHNIQUE: Standard myocardial SPECT imaging was performed after resting intravenous injection of 10 mCi Tc-35m tetrofosmin. Subsequently, intravenous infusion of Lexiscan was performed under the supervision of the Cardiology staff. At peak effect of the drug, 30 mCi Tc-22m tetrofosmin was injected intravenously and standard myocardial SPECT imaging was performed. Quantitative gated imaging was also performed to evaluate left ventricular wall motion, and estimate left ventricular ejection fraction. COMPARISON:  Chest radiographs 03/30/2016 FINDINGS: Perfusion: The perfusion appears mildly  heterogeneous, especially on the rest images. There is a small fixed perfusion defect in the lateral wall. There is a possible small area of reversibility within the mid to distal anterior septum. Wall Motion: Generalized moderate hypokinesis. No focal wall motion abnormalities are seen. Left Ventricular Ejection Fraction: 39 % End diastolic volume [id number] ml End systolic volume 75 ml IMPRESSION: 1. Heterogeneous myocardial perfusion with probable small infarct in the lateral wall. Cannot  exclude ischemia in the mid to distal anterior septum. 2. Global hypokinesis. 3. Left ventricular ejection fraction 39% 4. Non invasive risk stratification*: Intermediate *2012 Appropriate Use Criteria for Coronary Revascularization Focused Update: J Am Coll Cardiol. [id number]. http://content.[email or web address]?[email or web address] Electronically Signed   By: Richardean Sale M.D.   On: 03/31/2016 13:30    Scheduled Meds: . acetaminophen      . aspirin EC  81 mg Oral Daily  . atorvastatin  10 mg Oral q1800  . carbamide peroxide  5 drop Both Ears BID  . colchicine  0.6 mg Oral BID  . gabapentin  600 mg Oral BID  . heparin  5,000 Units Subcutaneous Q8H  . insulin aspart  0-9 Units Subcutaneous TID WC  . nortriptyline  20 mg Oral QHS  . potassium chloride SA  20 mEq Oral BID  . sodium chloride flush  3 mL Intravenous Q12H  . [START ON 04/05/2016] Vitamin D (Ergocalciferol)  50,000 Units Oral Weekly      Time spent: 25 min  [facility] Pager [phone]. If 7PM-7AM, please contact night-coverage at [email or web address], Office  [phone]  password [email or web address] 03/31/2016, 2:50 PM  LOS: 0 days

## 2016-03-31 NOTE — Progress Notes (Signed)
Patient is requesting to be established with another PCP; apt made with Wilfred Lacy NP with [facility] as requested by patient for May 01, 2016 at 11 am; B Pennie Rushing [phone]

## 2016-03-31 NOTE — Evaluation (Signed)
Physical Therapy Evaluation Patient Details Name: Sandra Brennan MRN: [id number] DOB: 1945/04/17 Today's Date: 03/31/2016   History of Present Illness  71 y.o. female with a Past Medical History of coronary disease with prior myocardial infarction about 20 years ago. So, diabetes type 2, hypertension, hyperlipidemia, presents to ED after she [place] a fall at [place], hitting the right temporal region of her head secondary to severe sudden onset lower mid chest pain.  Of note, she does mention neuropathy of her right lower extremity and balance issues.  Clinical Impression  Patient demonstrates deficits in functional mobility as indicated below. Will need continued skilled PT to address deficits and maximize function. Will see as indicated and progress as tolerated. At this time, patient very flat and unsteady with ambulation. Feel patient will benefit from HHPT and initial supervision upon discharge pending progress.    Follow Up Recommendations Home health PT;Supervision/Assistance - 24 hour    Equipment Recommendations  None recommended by PT    Recommendations for Other Services       Precautions / Restrictions Precautions Precautions: Fall      Mobility  Bed Mobility Overal bed mobility: Needs Assistance Bed Mobility: Supine to Sit     Supine to sit: Min assist     General bed mobility comments: min assist to come to EOB and elevate trunk to upright positioning. increased time and effort noted.   Transfers Overall transfer level: Needs assistance Equipment used: 1 person hand held assist Transfers: Sit to/from Stand Sit to Stand: Min assist         General transfer comment: Min assist to elevate to standing, continued assist for stability  Ambulation/Gait Ambulation/Gait assistance: Min assist Ambulation Distance (Feet): 80 Feet Assistive device: 1 person hand held assist Gait Pattern/deviations: Step-through pattern;Decreased stride length;Shuffle;Trunk  flexed;Staggering right Gait velocity: decreased Gait velocity interpretation: <1.8 ft/sec, indicative of risk for recurrent falls General Gait Details: patient very unsteady with ambulation, 3 standing rest breaks over short distance. VS assessed and stable HR and saturations.   Stairs            Wheelchair Mobility    Modified [place] (Stroke Patients Only)       Balance Overall balance assessment: History of Falls                                           Pertinent Vitals/Pain Pain Assessment: No/denies pain    Home Living Family/patient expects to be discharged to:: Private residence Living Arrangements: Other relatives Available Help at Discharge: Family;Available 24 hours/day Type of Home: Apartment Home Access: Level entry     Home Layout: One level Home Equipment: Grab bars - toilet;Grab bars - tub/shower;Bedside commode;Walker - 2 wheels;Cane - single point      Prior Function Level of Independence: Independent with assistive device(s)         Comments: provides care for her mother     Hand Dominance   Dominant Hand: Right    Extremity/Trunk Assessment   Upper Extremity Assessment Upper Extremity Assessment: Generalized weakness    Lower Extremity Assessment Lower Extremity Assessment: Generalized weakness;RLE deficits/detail;LLE deficits/detail RLE Sensation: history of peripheral neuropathy LLE Sensation: history of peripheral neuropathy (states LLE neuropathy new onset\)       Communication   Communication: No difficulties  Cognition Arousal/Alertness: Awake/alert Behavior During Therapy: Flat affect Overall Cognitive Status: Within Functional Limits for  tasks assessed                      General Comments      Exercises     Assessment/Plan    PT Assessment Patient needs continued PT services  PT Problem List Decreased strength;Decreased activity tolerance;Decreased balance;Decreased  mobility;Decreased safety awareness;Impaired sensation;Obesity          PT Treatment Interventions DME instruction;Gait training;Functional mobility training;Therapeutic activities;Therapeutic exercise;Balance training;Neuromuscular re-education;Patient/family education    PT Goals (Current goals can be found in the Care Plan section)  Acute Rehab PT Goals Patient Stated Goal: to feel better PT Goal Formulation: With patient Time For Goal Achievement: 04/14/16 Potential to Achieve Goals: Good    Frequency Min 3X/week   Barriers to discharge        Co-evaluation               End of Session Equipment Utilized During Treatment: Gait belt Activity Tolerance: Patient limited by fatigue Patient left: in chair;with call bell/phone within reach;with family/visitor present Nurse Communication: Mobility status    Functional Assessment Tool Used: clinical judgement Functional Limitation: Mobility: Walking and moving around Mobility: Walking and Moving Around Current Status [phone]): At least 20 percent but less than 40 percent impaired, limited or restricted Mobility: Walking and Moving Around Goal Status [phone]): At least 1 percent but less than 20 percent impaired, limited or restricted    Time: 1322-1339 PT Time Calculation (min) (ACUTE ONLY): 17 min   Charges:   PT Evaluation $PT Eval Moderate Complexity: 1 Procedure     PT G Codes:   PT G-Codes **NOT FOR INPATIENT CLASS** Functional Assessment Tool Used: clinical judgement Functional Limitation: Mobility: Walking and moving around Mobility: Walking and Moving Around Current Status [id number]): At least 20 percent but less than 40 percent impaired, limited or restricted Mobility: Walking and Moving Around Goal Status [phone]): At least 1 percent but less than 20 percent impaired, limited or restricted    Duncan Dull 03/31/2016, [place], [place] DPT  [phone]

## 2016-03-31 NOTE — Progress Notes (Signed)
  Echocardiogram 2D Echocardiogram with Definity has been performed.  Diamond Nickel 03/31/2016, 4:15 PM

## 2016-03-31 NOTE — Progress Notes (Signed)
PT Cancellation Note  Patient Details Name: Sandra Brennan MRN: [id number] DOB: 1946/02/07   Cancelled Treatment:    Reason Eval/Treat Not Completed: Patient at procedure or test/unavailable   Duncan Dull 03/31/2016, 7:47 AM

## 2016-03-31 NOTE — Progress Notes (Signed)
Patient has home CPAP unit in room . Patient able to place on and off when ready.

## 2016-03-31 NOTE — Progress Notes (Signed)
The patient was seen in nuclear medicine for a Lexiscan myoview. She tolerated the procedure well. No acute ST or TW changes on ECG.   Brave Dack, NP  [facility]  

## 2016-03-31 NOTE — Progress Notes (Signed)
Hypoglycemic Event  CBG: 67  Treatment: orange juice, crackers and peanut butter, and lunch tray  Symptoms: None  Follow-up CBG: Time: 1420 CBG Result: [address] [place]

## 2016-04-01 DIAGNOSIS — I10 Essential (primary) hypertension: Secondary | ICD-10-CM | POA: Diagnosis not present

## 2016-04-01 DIAGNOSIS — E669 Obesity, unspecified: Secondary | ICD-10-CM | POA: Diagnosis not present

## 2016-04-01 DIAGNOSIS — R079 Chest pain, unspecified: Secondary | ICD-10-CM | POA: Diagnosis not present

## 2016-04-01 DIAGNOSIS — I951 Orthostatic hypotension: Secondary | ICD-10-CM | POA: Diagnosis not present

## 2016-04-01 DIAGNOSIS — E1169 Type 2 diabetes mellitus with other specified complication: Secondary | ICD-10-CM | POA: Diagnosis not present

## 2016-04-01 LAB — BASIC METABOLIC PANEL
Anion gap: 6 (ref 5–15)
BUN: 11 mg/dL (ref 6–20)
CHLORIDE: 110 mmol/L (ref 101–111)
CO2: 25 mmol/L (ref 22–32)
Calcium: 8.1 mg/dL — ABNORMAL LOW (ref 8.9–10.3)
Creatinine, Ser: 1.24 mg/dL — ABNORMAL HIGH (ref 0.44–1.00)
GFR calc Af Amer: 51 mL/min — ABNORMAL LOW (ref >60)
GFR calc non Af Amer: 44 mL/min — ABNORMAL LOW (ref >60)
GLUCOSE: 82 mg/dL (ref 65–99)
POTASSIUM: 3.6 mmol/L (ref 3.5–5.1)
SODIUM: 141 mmol/L (ref 135–145)

## 2016-04-01 LAB — GLUCOSE, CAPILLARY
GLUCOSE-CAPILLARY: 69 mg/dL (ref 65–99)
GLUCOSE-CAPILLARY: 75 mg/dL (ref 65–99)
Glucose-Capillary: 80 mg/dL (ref 65–99)

## 2016-04-01 MED ORDER — LISINOPRIL-HYDROCHLOROTHIAZIDE 10-12.5 MG PO TABS: 1.0000 | ORAL_TABLET | Freq: Every day | ORAL | 2 refills | Status: DC

## 2016-04-01 NOTE — Discharge Summary (Addendum)
Physician Discharge Summary  CAMYIA ERTMAN [id number] DOB: 1945/03/19 DOA: 03/30/2016  PCP: Leola Brazil, MD  Admit date: 03/30/2016 Discharge date: 04/01/2016  Time spent: 25* minutes  Recommendations for Outpatient Follow-up:  1. Follow up PCP in 2 weeks 2. Start taking Lisinopril/HCTZ 10/12.5 mg po daily from 04/03/16 3. HHPT at discharge   Discharge Diagnoses:  Principal Problem:   Chest pain Active Problems:   Diabetes mellitus type 2 in obese [facility])   Hypertension   Sleep apnea   Gout   Fall   Laceration of forehead   Discharge Condition: Stable  Diet recommendation: Low salt diet  Filed Weights   03/30/16 1432 03/31/16 0608 04/01/16 0454  Weight: 98.7 kg (217 lb 8 oz) 96.4 kg (212 lb 8 oz) 96.6 kg (213 lb)    History of present illness:  71 y.o.femalewith a Past Medical History of coronary disease with prior myocardial infarction about 20 years ago. So, diabetes type 2, hypertension, hyperlipidemia, presents to ED after she sustained a fall at church, hitting the right temporal region of her head secondary to severe sudden onset lower mid chest pain. Of note, she does mention neuropathy of her right lower extremity and balance issues. She is afraid of walking because of her complaints of disequilibrium  Hospital Course:  1. Syncope- positive orthostatic hypotension, will discontinue Maxzide. Echo shows grade 1 diastolic dysfunction.  She does complain of  Intermittent dizziness on getting up from sitting position. MRI brain showed no acute changes. 2. Chest pain- cardiac enzymes 3 are negative, underwent Lexiscan Myoview, which was negative. 3. Diabetes mellitus- patient had spells of hypoglycemia in the hospital, Hb a1c is 5.4. Will discontinue Metformin. 4. History of hypertension-blood pressure stable, hold  Maxzide is discontinued. Start taking Lisinopril / HCTZ 10/12.5 po daily. 5. Acute kidney injury on chronic kidney stage III-  improved after  holding Diuretics, and IV normal saline,  today Cr is 1.24. Her baseline  creatinine 1.3Continue gentle IV hydration with normal saline at 75 mL per hr. patient takes diclofenac at home, which will be discontinued.  Procedures:  Echo  Lexiscan myoview  Consultations:  None   Discharge Exam: Vitals:   04/01/16 0027 04/01/16 0454  BP: 139/67   Pulse: 74 69  Resp: 18 20  Temp: 98.3 F (36.8 C) 98.1 F (36.7 C)    General: Appears in no acute distress Cardiovascular: RRR, S1S2 Respiratory: Clear bilaterally  Discharge Instructions   Discharge Instructions    Diet - low sodium heart healthy    Complete by:  As directed    Discharge instructions    Complete by:  As directed    Start taking Zestoretic, BP medication from 04/03/16   Increase activity slowly    Complete by:  As directed      Current Discharge Medication List    START taking these medications   Details  lisinopril-hydrochlorothiazide (ZESTORETIC) 10-12.5 MG tablet Take 1 tablet by mouth daily. Qty: 30 tablet, Refills: 2      CONTINUE these medications which have NOT CHANGED   Details  albuterol (PROVENTIL HFA;VENTOLIN HFA) 108 (90 BASE) MCG/ACT inhaler Inhale 2 puffs into the lungs every 6 (six) hours as needed for wheezing.    aspirin EC 81 MG tablet Take 81 mg by mouth daily.    atorvastatin (LIPITOR) 10 MG tablet     colchicine 0.6 MG tablet Take 0.6 mg by mouth 2 (two) times daily.    gabapentin (NEURONTIN) 600 MG tablet Take  600 mg by mouth 2 (two) times daily.     HYDROcodone-acetaminophen (NORCO) 7.5-325 MG per tablet Take 1 tablet by mouth every 6 (six) hours as needed. for pain Refills: 0    nortriptyline (PAMELOR) 10 MG capsule Take 2 capsules (20 mg total) by mouth at bedtime. Qty: 180 capsule, Refills: 4    Vitamin D, Ergocalciferol, (DRISDOL) 50000 UNITS CAPS capsule Take 50,000 Units by mouth once a week. Refills: 4    cyclobenzaprine (FLEXERIL) 5 MG tablet Take 1 tablet (5 mg  total) by mouth 3 (three) times daily as needed for muscle spasms. Qty: 30 tablet, Refills: 0    DULoxetine (CYMBALTA) 60 MG capsule Take 1 capsule (60 mg total) by mouth daily. Qty: 30 capsule, Refills: 12      STOP taking these medications     diclofenac (VOLTAREN) 50 MG EC tablet      metFORMIN (GLUCOPHAGE-XR) 500 MG 24 hr tablet      potassium chloride SA (K-DUR,KLOR-CON) 20 MEQ tablet      triamterene-hydrochlorothiazide (MAXZIDE-25) 37.5-25 MG per tablet      HYDROcodone-acetaminophen (NORCO/VICODIN) 5-325 MG tablet      predniSONE (DELTASONE) 20 MG tablet        Allergies  Allergen Reactions  . Cymbalta [Duloxetine Hcl] Anaphylaxis  . Penicillins Hives  . Sulfa Antibiotics Other (See Comments)    REACTION: unknown  . [place], NP Follow up on 05/01/2016.   Specialty:  Internal Medicine Why:  at 11 am at [facility]; Please try to keep your apt or call to reschedule; Contact information: [address] [place] [address] [phone]            The results of significant diagnostics from this hospitalization (including imaging, microbiology, ancillary and laboratory) are listed below for reference.    Significant Diagnostic Studies: Dg Chest 2 View  Result Date: 03/30/2016 CLINICAL DATA:  Patient with syncopal episode and church. EXAM: CHEST  2 VIEW COMPARISON:  Chest radiograph 07/15/2014. FINDINGS: Normal cardiac and mediastinal contours. No consolidative pulmonary opacities. No pleural effusion or pneumothorax. Thoracic spine degenerative changes. Extensive bilateral glenohumeral joint degenerative changes/AVN. IMPRESSION: No active cardiopulmonary disease. Electronically Signed   By: Lovey Newcomer M.D.   On: 03/30/2016 10:24   Ct Head Wo Contrast  Result Date: 03/30/2016 CLINICAL DATA:  Status post fall with neck stiffness and possible loss of consciousness. EXAM: CT HEAD WITHOUT CONTRAST CT  CERVICAL SPINE WITHOUT CONTRAST TECHNIQUE: Multidetector CT imaging of the head and cervical spine was performed following the standard protocol without intravenous contrast. Multiplanar CT image reconstructions of the cervical spine were also generated. COMPARISON:  MRI of the cervical spine 09/15/2015, CT of the head 11/17/2008 FINDINGS: CT HEAD FINDINGS Brain: No evidence of acute intracranial hemorrhage, hydrocephalus, extra-axial collection or mass lesion/mass effect. There is diffusely hypoattenuated appearance of the right temporal lobe. Vascular: Calcific atherosclerotic disease at the skullbase. Skull: Normal. Negative for fracture or focal lesion. Sinuses/Orbits: Minimal opacification of the right mastoid air cells. Mild mucosal thickening of the bilateral ethmoid air persists. Other: None. CT CERVICAL SPINE FINDINGS Alignment: Reversal of physiologic lordosis, likely due to extensive osteoarthritic changes. Skull base and vertebrae: No acute fracture. No primary bone lesion or focal pathologic process. Soft tissues and spinal canal: No prevertebral fluid or swelling. No visible canal hematoma. Disc levels: Multilevel osteoarthritic changes with narrowing of the disc spaces, endplate sclerosis, degenerative remodeling of vertebral bodies.  Light congenital ankylosis of C2-C3. Upper chest: Negative. Other: None. IMPRESSION: No evidence of acute intracranial injury. Diffusely hypoattenuated appearance of the right temple lower lobe. This portion of the brain is prone to artifacts and therefore this may be artifactual, however ischemic infarction cannot be excluded. If clinical symptoms support possible infarction of the right temporal lobe, MRI of the brain should be considered. No evidence of acute traumatic injury to the cervical spine. Straightening of cervical lordosis with extensive multilevel osteoarthritic changes and segmentation anomaly at C2-C3. Electronically Signed   By: Fidela Salisbury M.D.    On: 03/30/2016 11:14   Ct Cervical Spine Wo Contrast  Result Date: 03/30/2016 CLINICAL DATA:  Status post fall with neck stiffness and possible loss of consciousness. EXAM: CT HEAD WITHOUT CONTRAST CT CERVICAL SPINE WITHOUT CONTRAST TECHNIQUE: Multidetector CT imaging of the head and cervical spine was performed following the standard protocol without intravenous contrast. Multiplanar CT image reconstructions of the cervical spine were also generated. COMPARISON:  MRI of the cervical spine 09/15/2015, CT of the head 11/17/2008 FINDINGS: CT HEAD FINDINGS Brain: No evidence of acute intracranial hemorrhage, hydrocephalus, extra-axial collection or mass lesion/mass effect. There is diffusely hypoattenuated appearance of the right temporal lobe. Vascular: Calcific atherosclerotic disease at the skullbase. Skull: Normal. Negative for fracture or focal lesion. Sinuses/Orbits: Minimal opacification of the right mastoid air cells. Mild mucosal thickening of the bilateral ethmoid air persists. Other: None. CT CERVICAL SPINE FINDINGS Alignment: Reversal of physiologic lordosis, likely due to extensive osteoarthritic changes. Skull base and vertebrae: No acute fracture. No primary bone lesion or focal pathologic process. Soft tissues and spinal canal: No prevertebral fluid or swelling. No visible canal hematoma. Disc levels: Multilevel osteoarthritic changes with narrowing of the disc spaces, endplate sclerosis, degenerative remodeling of vertebral bodies. Light congenital ankylosis of C2-C3. Upper chest: Negative. Other: None. IMPRESSION: No evidence of acute intracranial injury. Diffusely hypoattenuated appearance of the right temple lower lobe. This portion of the brain is prone to artifacts and therefore this may be artifactual, however ischemic infarction cannot be excluded. If clinical symptoms support possible infarction of the right temporal lobe, MRI of the brain should be considered. No evidence of acute  traumatic injury to the cervical spine. Straightening of cervical lordosis with extensive multilevel osteoarthritic changes and segmentation anomaly at C2-C3. Electronically Signed   By: Fidela Salisbury M.D.   On: 03/30/2016 11:14   Mr Brain Wo Contrast  Result Date: 03/30/2016 CLINICAL DATA:  70 y/o F; status post fall with abnormal CT of the head. EXAM: MRI HEAD WITHOUT CONTRAST TECHNIQUE: Multiplanar, multiecho pulse sequences of the brain and surrounding structures were obtained without intravenous contrast. COMPARISON:  03/30/2016 CT of the head. 08/03/2014 MRI of the brain. FINDINGS: Brain: No acute infarction, hemorrhage, hydrocephalus, extra-axial collection or mass lesion. Few scattered foci of T2 FLAIR hyperintensity in subcortical white matter are compatible with minimal chronic microvascular ischemic changes without significant interval change. Vascular: Normal flow voids. Skull and upper cervical spine: Normal marrow signal. Hyperostosis frontalis interna. Sinuses/Orbits: Trace bilateral mastoid effusions. Mild diffuse paranasal sinus mucosal thickening greatest in the ethmoid air cells. Orbits are unremarkable. Other: None. IMPRESSION: 1. No acute intracranial abnormality. 2. No abnormal signal within the temporal lobes, hypoattenuation on CT is artifactual. 3. Minimal chronic microvascular ischemic changes without significant interval change. 4. Mild paranasal sinus disease and trace mastoid effusions. Electronically Signed   By: Kristine Garbe M.D.   On: 03/30/2016 17:35   Nm Myocar Multi W/spect W/wall  Motion / Ef  Result Date: 03/31/2016 CLINICAL DATA:  71 year old with chest pain. History of myocardial infarction, diabetes and hypertension. EXAM: MYOCARDIAL IMAGING WITH SPECT (REST AND PHARMACOLOGIC-STRESS) GATED LEFT VENTRICULAR WALL MOTION STUDY LEFT VENTRICULAR EJECTION FRACTION TECHNIQUE: Standard myocardial SPECT imaging was performed after resting intravenous injection  of 10 mCi Tc-64m tetrofosmin. Subsequently, intravenous infusion of Lexiscan was performed under the supervision of the Cardiology staff. At peak effect of the drug, 30 mCi Tc-18m tetrofosmin was injected intravenously and standard myocardial SPECT imaging was performed. Quantitative gated imaging was also performed to evaluate left ventricular wall motion, and estimate left ventricular ejection fraction. COMPARISON:  Chest radiographs 03/30/2016 FINDINGS: Perfusion: The perfusion appears mildly heterogeneous, especially on the rest images. There is a small fixed perfusion defect in the lateral wall. There is a possible small area of reversibility within the mid to distal anterior septum. Wall Motion: Generalized moderate hypokinesis. No focal wall motion abnormalities are seen. Left Ventricular Ejection Fraction: 39 % End diastolic volume [id number] ml End systolic volume 75 ml IMPRESSION: 1. Heterogeneous myocardial perfusion with probable small infarct in the lateral wall. Cannot exclude ischemia in the mid to distal anterior septum. 2. Global hypokinesis. 3. Left ventricular ejection fraction 39% 4. Non invasive risk stratification*: Intermediate *2012 Appropriate Use Criteria for Coronary Revascularization Focused Update: J Am Coll Cardiol. [id number]. http://content.airportbarriers.com.aspx?articleid=[email or web address] Electronically Signed   By: Richardean Sale M.D.   On: 03/31/2016 13:30    Microbiology: No results found for this or any previous visit (from the past 240 hour(s)).   Labs: Basic Metabolic Panel:  Recent Labs Lab 03/30/16 0956 03/30/16 1440 03/31/16 0040 04/01/16 0441  NA 142  --  141 141  K 3.8  --  4.0 3.6  CL 110  --  110 110  CO2 23  --  26 25  GLUCOSE 85  --  99 82  BUN 15  --  14 11  CREATININE 1.52* 1.47* 1.56* 1.24*  CALCIUM 9.1  --  8.2* 8.1*   Liver Function Tests:  Recent Labs Lab 03/30/16 0956  AST 46*  ALT 44  ALKPHOS 123  BILITOT 0.6  PROT 6.2*  ALBUMIN  3.3*    CBC:  Recent Labs Lab 03/30/16 0956 03/30/16 1440 03/31/16 0040  WBC 5.3 5.6 5.0  NEUTROABS 3.1  --   --   HGB 10.7* 10.8* 9.1*  HCT 34.4* 34.2* 28.9*  MCV 92.7 94.5 92.6  PLT 182 141* 160   Cardiac Enzymes:  Recent Labs Lab 03/30/16 0956 03/30/16 1440 03/30/16 1812 03/31/16 0040  TROPONINI <0.03 <0.03 <0.03 <0.03    CBG:  Recent Labs Lab 03/31/16 1419 03/31/16 1703 03/31/16 2039 04/01/16 0646 04/01/16 0703  GLUCAP 114* 96 95 69 75       Signed:  LAMA,GAGAN S MD.  [facility] 04/01/2016, 10:50 AM

## 2016-04-01 NOTE — Progress Notes (Signed)
Patient alert and oriented, slept during the night, complained of leg pain which was relieved whit pain medication. Hypoglicemic in the morning, CBG 69, orange juice given, CBG 75 after 15 minutes. No other issues or complaints at this time. Will continue to monitor.   Flora Parks, RN

## 2016-04-01 NOTE — Care Management Note (Signed)
Case Management Note  Patient Details  Name: Sandra Brennan MRN: [id number] Date of Birth: 1945-07-04         Action/Plan: CM talked to patient about [place] choices, pt chose [facility]; Valinda Hoar with [facility] called for arrangements;  Expected Discharge Date:  04/01/16               Expected Discharge Plan:  [place]  Discharge planning Services  CM Consult Choice offered to:  Patient  HH Arranged:  PT [facility] Agency:  [facility]  Status of Service:  In process, will continue to follow  Sherrilyn Rist [id number] 04/01/2016, 11:36 AM

## 2016-04-01 NOTE — Progress Notes (Signed)
Hypoglycemic Event  CBG: 69  Treatment: 15 GM carbohydrate snack  Symptoms: None  Follow-up CBG: Time:15 CBG Result:75  Possible Reasons for Event: Unknown\  Comments/MD notified:per protocol    Sandra Brennan

## 2016-04-04 ENCOUNTER — Other Ambulatory Visit: Payer: Self-pay

## 2016-04-04 NOTE — Patient Outreach (Signed)
[facility]) Care Management  04/04/2016  Sandra Brennan May 13, 1945 [id number]     EMMI-COPD RED ON EMMI ALERT Day # 1 Date: 04/03/16 Red Alert Reason: "Feeling worse overall? Yes"  'Mucus change color? Yes"    Outreach attempt #1 to patient. Spoke with patient. Reviewed with patient red alert. Patient states she did not feel well yesterday. She reports that she thinks it was her blood sugars may have been low. Discussed s/s of hypoglycemia and ways to treat. Patient reports she ate some graham crackers and orange juice and she felt better. She states she does not have cbg meter in the home as she donated hers to the church a while back. She reports that until recent hospitalization her blood sugars had been under control and she didn't need to monitor them in the home. Patient also reports that she sent her dtr to pharmacy to pick up new script(Zestoretic) which she was told to start taking at discharge. She states med was not there and pharmacy had no script for it. Patient reports she did not get script along with discharge papers. Patient agreeable to RN CM contacting PCP office to follow up on two issues. Of note, patient denies COPD and no diagnosis of COPD found in medical record. Patient has h/o sleep apnea ans uses CPAP. Advised that RN CM will deactivate EMMI COPD calls as not appropriate if patient doesn't have diagnosis.  Outreach call to PCP office-Dr. Katherine Roan. Left voicemail advising of patient's recent d/c from hospital. Informed that patient had several hypoglycemia events in hospital and doesn't have a way to monitor cbgs in the home but has reported history/diagnosis of DM. Also, requested that MD office clarify and follow up with patient regarding new med that she doesn't have script for.     Plan: RN CM will notify [facility] administrative assistant to deactivate EMMI calls and close case.    Enzo Montgomery, RN,BSN,CCM [place] Management Telephonic  Care Management Coordinator Direct Phone: [phone] Toll Free: [phone] Fax: [phone]

## 2016-04-23 ENCOUNTER — Ambulatory Visit (INDEPENDENT_AMBULATORY_CARE_PROVIDER_SITE_OTHER): Payer: Medicare Other

## 2016-04-23 ENCOUNTER — Ambulatory Visit (INDEPENDENT_AMBULATORY_CARE_PROVIDER_SITE_OTHER): Payer: Medicare Other | Admitting: Orthopedic Surgery

## 2016-04-23 ENCOUNTER — Encounter (INDEPENDENT_AMBULATORY_CARE_PROVIDER_SITE_OTHER): Payer: Self-pay | Admitting: Orthopedic Surgery

## 2016-04-23 VITALS — BP 127/112 | HR 88 | Resp 14 | Ht 62.0 in | Wt 241.0 lb

## 2016-04-23 DIAGNOSIS — M19071 Primary osteoarthritis, right ankle and foot: Secondary | ICD-10-CM

## 2016-04-23 DIAGNOSIS — M25571 Pain in right ankle and joints of right foot: Secondary | ICD-10-CM

## 2016-04-23 DIAGNOSIS — M14671 Charcot's joint, right ankle and foot: Secondary | ICD-10-CM | POA: Diagnosis not present

## 2016-04-23 NOTE — Progress Notes (Signed)
Office Visit Note   Patient: Sandra Brennan           Date of Birth: 1945/05/15           MRN: [id number] Visit Date: 04/23/2016              Requested by: Vincente Liberty, MD [address] [place], [place] [address] PCP: Leola Brazil, MD   Assessment & Plan: Visit Diagnoses:  1. Pain in right ankle and joints of right foot   2. Charcot ankle, right   3. Arthritis of ankle or foot, degenerative, right     Plan:  #1: Equalizer boot to right  Follow-Up Instructions: Return in about 3 weeks (around 05/14/2016).   Orders:  Orders Placed This Encounter  Procedures  . XR Ankle Complete Right  . XR Foot 2 Views Right   No orders of the defined types were placed in this encounter.     Procedures: No procedures performed   Clinical Data: No additional findings.   Subjective: Chief Complaint  Patient presents with  . Right Ankle - Injury, Pain    Sandra Brennan is a 71 year old african [place] female that is here today following up on a fall (unconscious)she had on 03/30/16. She did go to the ED and stayed 3 days. She relates today her right foot/ankle is painful even to wear shoes. She does have have edema at the end of the day from being on her feet. Pt ambulates with a cane. No xrays were obtained at her ED visit.  PT Home health has visited to help her gain her strength    Review of Systems  Respiratory: Positive for apnea (Sleep apnea).   Endocrine:       History diabetes  All other systems reviewed and are negative.    Objective: Vital Signs: BP (!) 127/112   Pulse 88   Resp 14   Ht 5\' 2"  (1.575 m)   Wt 241 lb (109.3 kg)   BMI 44.08 kg/m   Physical Exam  Constitutional: She is oriented to person, place, and time. She appears well-developed and well-nourished.  HENT:  Head: Normocephalic and atraumatic.  Eyes: EOM are normal. Pupils are equal, round, and reactive to light.  Pulmonary/Chest: Effort normal.  Neurological: She is alert  and oriented to person, place, and time.  Skin: Skin is warm and dry.  Psychiatric: She has a normal mood and affect. Her behavior is normal. Judgment and thought content normal.    Right Ankle Exam  Swelling: moderate  Tenderness  The patient is experiencing tenderness in the medial malleolus, deltoid, lateral malleolus, ATF and CF.    Comments:  Marked limited range of motion of the foot she has no arch at all. Diffusely tender about the ankle and midfoot. Does have some claw toeing. She states sensation is intact to light touch. Good capillary refill.      Specialty Comments:  No specialty comments available.  Imaging: Xr Ankle Complete Right  Result Date: 04/23/2016 2 x-rays of the right foot reveals  Marked degenerative changes in the tip talar joint . She also has significant midfoot OA with the dorsal spurring. Plantar heel spurs noted. Possible Charcot joint. Marked osteopenia metatarsal phalangeal area.   Xr Foot 2 Views Right  Result Date: 04/23/2016 Three-view x-rays of the right ankle reveals collapse of the talus. Marked degenerative changes in the tip talar joint as well as the talofibular joint also. She also has significant  midfoot OA with the dorsal spurring. Plantar heel spurs noted. Possible Charcot joint    PMFS History: Patient Active Problem List   Diagnosis Date Noted  . Syncope 03/30/2016  . Chest pain 03/30/2016  . Fall 03/30/2016  . Laceration of forehead   . Obesity 05/06/2013  . Dehydration 05/05/2013  . Viral gastroenteritis 05/05/2013  . Volume depletion, gastrointestinal loss 05/05/2013  . Acute nontraumatic kidney injury ([place]) 05/05/2013  . Acute posthemorrhagic anemia 09/06/2012  . Gout 09/05/2012  . Postoperative wound infection of left hip 08/27/2012  . Osteoarthritis of left hip 08/05/2012  . Diabetes mellitus type 2 in obese ([place]) 08/05/2012  . Hypertension 08/05/2012  . Asthma, chronic 08/05/2012  . Sleep apnea 08/05/2012    Past Medical History:  Diagnosis Date  . Anemia   . Arthritis    "plenty" (10-Aug-2012)  . Asthma   . Chronic lower back pain    "they say I need a whole new spinal column" (10-Aug-2012)  . COPD (chronic obstructive pulmonary disease) ([place])   . Degenerative arthritis    "all over" (08-10-2012)  . GERD (gastroesophageal reflux disease)   . Gout 05/05/2013  . Hypertension   . Migraines    "used to; totally stopped when I quit drinking 30 yr ago" (Aug 10, 2012)  . Myalgia and myositis   . Myocardial infarction 1992   08/10/12 "mild MI when son died "  . Obesity   . Osteoporosis    "all over" (August 10, 2012)  . Shortness of breath    when stressed.  . Sleep apnea    "never did fix my machine; haven't used one for 5 years; I've lost 116# since then; no problems now" (10-Aug-2012)  . Type II diabetes mellitus ([place])    "borderline; don't test; take Metformin" (Aug 10, 2012)    Family History  Problem Relation Age of Onset  . Arthritis Mother   . Arthritis Father   . Colon cancer Father   . Diabetes Sister   . Arthritis Sister   . Diabetes Maternal Grandmother   . Arthritis Maternal Grandmother   . Diabetes Maternal Grandfather   . Arthritis Maternal Grandfather     Past Surgical History:  Procedure Laterality Date  . ABDOMINAL HYSTERECTOMY  1990's  . GANGLION CYST EXCISION Right 1980's   "wrist" (08-10-12)  . JOINT REPLACEMENT    . KNEE LIGAMENT RECONSTRUCTION Right 1980's  . TONSILLECTOMY  ~ 1954  . TOTAL HIP ARTHROPLASTY Left 2012-08-10  . TOTAL HIP ARTHROPLASTY Left 08/10/2012   Procedure: TOTAL HIP ARTHROPLASTY;  Surgeon: Garald Balding, MD;  Location: [facility];  Service: Orthopedics;  Laterality: Left;  Left Total Hip Arthroplasty  . TOTAL HIP ARTHROPLASTY Left 08/28/2012   Procedure: Irrigation and Debridement hip ;  Surgeon: Mcarthur Rossetti, MD;  Location: [facility];  Service: Orthopedics;  Laterality: Left;  . TOTAL KNEE ARTHROPLASTY Left 2001  . TOTAL KNEE ARTHROPLASTY Right 2004    Social History   Occupational History  . Retired    Social History Main Topics  . Smoking status: Former Smoker    Packs/day: 0.12    Years: 2.00    Types: Cigarettes  . Smokeless tobacco: Never Used     Comment: Aug 10, 2012 "quit 30 years ago"  . Alcohol use No     Comment: Aug 10, 2012 "used to be a beeralcoholic"; stopped ~ 30 yr ago"  . Drug use: No  . Sexual activity: No

## 2016-04-24 ENCOUNTER — Ambulatory Visit (INDEPENDENT_AMBULATORY_CARE_PROVIDER_SITE_OTHER): Payer: Medicare Other | Admitting: Orthopedic Surgery

## 2016-05-01 ENCOUNTER — Encounter: Payer: Self-pay | Admitting: Nurse Practitioner

## 2016-05-01 ENCOUNTER — Ambulatory Visit (INDEPENDENT_AMBULATORY_CARE_PROVIDER_SITE_OTHER): Payer: Medicare Other | Admitting: Nurse Practitioner

## 2016-05-01 VITALS — BP 130/80 | HR 84 | Temp 98.0°F | Ht 62.0 in | Wt 207.2 lb

## 2016-05-01 DIAGNOSIS — S82891A Other fracture of right lower leg, initial encounter for closed fracture: Secondary | ICD-10-CM | POA: Insufficient documentation

## 2016-05-01 DIAGNOSIS — G894 Chronic pain syndrome: Secondary | ICD-10-CM | POA: Diagnosis not present

## 2016-05-01 DIAGNOSIS — Z1211 Encounter for screening for malignant neoplasm of colon: Secondary | ICD-10-CM | POA: Diagnosis not present

## 2016-05-01 DIAGNOSIS — K635 Polyp of colon: Secondary | ICD-10-CM | POA: Diagnosis not present

## 2016-05-01 DIAGNOSIS — G4730 Sleep apnea, unspecified: Secondary | ICD-10-CM

## 2016-05-01 DIAGNOSIS — M797 Fibromyalgia: Secondary | ICD-10-CM | POA: Insufficient documentation

## 2016-05-01 DIAGNOSIS — I10 Essential (primary) hypertension: Secondary | ICD-10-CM

## 2016-05-01 NOTE — Patient Instructions (Addendum)
Need to sign medical release form to obtain records from previous pcp.  Patient advised that she will get further pain management from pain clinic once scheduled.

## 2016-05-01 NOTE — Progress Notes (Signed)
Pre visit review using our clinic review tool, if applicable. No additional management support is needed unless otherwise documented below in the visit note. 

## 2016-05-01 NOTE — Progress Notes (Signed)
Subjective:    Patient ID: Sandra Brennan, female    DOB: [birth year], 71 y.o.   MRN: [id number]  Patient presents today for establish care (new patient)  HPI  Previous pcp was Dr. Leonel Ramsay, last OV 1week ago. States she does not need medication refill at this time.  HTN: controlled with lisinopril-HCTZ.  Chronic Pain: Back, hips, knee and ankle pain.  over going for over 10years.  Managed with norco, and gabapentin. Unable to tolerate cymbalta.  Right ankle fracture: Due to fall 03/30/16 and underlying OA and charcot ankle. Managed by [facility] ortho.  Wearing Equalizer boot at this time.  Sleep Apnea: Current use of of CPAP at this time. Adjusted by neurologist.  Immunizations: (TDAP, Hep C screen, Pneumovax, Influenza, zoster)  Health Maintenance  Topic Date Due  .  Hepatitis C: One time screening is recommended by Center for Disease Control  (CDC) for  adults born from [birth year] through 1963.   Jul 07, 1945  . Complete foot exam   06/06/1957  . Eye exam for diabetics  06/06/1957  . Colon Cancer Screening  06/06/1997  . DEXA scan (bone density measurement)  06/06/2012  . Pneumonia vaccines (1 of 2 - PCV13) 06/06/2012  . Mammogram  11/02/2015  . Tetanus Vaccine  05/01/2017*  . Hemoglobin A1C  09/27/2016  *Topic was postponed. The date shown is not the original due date.   Diet:regular Weight:  Wt Readings from Last 3 Encounters:  05/01/16 207 lb 4 oz (94 kg)  04/23/16 241 lb (109.3 kg)  04/01/16 213 lb (96.6 kg)    Exercise:none Fall Risk:fell 03/31/15 Fall Risk  05/01/2016  Falls in the past year? Yes  Number falls in past yr: 1  Injury with Fall? Yes  Risk Factor Category  High Fall Risk  Risk for fall due to : History of fall(s);Impaired balance/gait  Risk for fall due to (comments): use of equalizer boot at this time   Home Safety: home with elderly mother. Has 3adult children and several grand children. Depression/Suicide: Depression screen [facility] 2/9  05/01/2016  Decreased Interest 0  Down, Depressed, Hopeless 0  PHQ - 2 Score 0   No flowsheet data found. Colonoscopy (every 5-96yrs, >50-52yrs):last done  Dexa (every 2-33yrs, >65yrs):needed Mammogram (yearly, >74yrs):needed Advanced Directive: Advanced Directives 01/11/2016  Does Patient Have a Medical Advance Directive? Yes  Type of Paramedic of [place];Living will  Does patient want to make changes to medical advance directive? No - Patient declined  Copy of [place] in Chart? No - copy requested  Would patient like information on creating a medical advance directive? -  Pre-existing out of facility DNR order (yellow form or pink MOST form) -    Medications and allergies reviewed with patient and updated if appropriate.  Patient Active Problem List   Diagnosis Date Noted  . Closed right ankle fracture 05/01/2016  . Colon polyps 05/01/2016  . Syncope 03/30/2016  . Chest pain 03/30/2016  . Fall 03/30/2016  . Obesity 05/06/2013  . Acute nontraumatic kidney injury ([place]) 05/05/2013  . Acute posthemorrhagic anemia 09/06/2012  . Gout 09/05/2012  . Osteoarthritis of left hip 08/05/2012  . Diabetes mellitus type 2 in obese ([place]) 08/05/2012  . Hypertension 08/05/2012  . Asthma, chronic 08/05/2012  . Sleep apnea 08/05/2012    Current Outpatient Prescriptions on File Prior to Visit  Medication Sig Dispense Refill  . albuterol (PROVENTIL HFA;VENTOLIN HFA) 108 (90 BASE) MCG/ACT inhaler Inhale 2 puffs into the lungs  every 6 (six) hours as needed for wheezing.    Marland Kitchen aspirin EC 81 MG tablet Take 81 mg by mouth daily.    Marland Kitchen atorvastatin (LIPITOR) 10 MG tablet     . colchicine 0.6 MG tablet Take 0.6 mg by mouth 2 (two) times daily.    . cyclobenzaprine (FLEXERIL) 5 MG tablet Take 1 tablet (5 mg total) by mouth 3 (three) times daily as needed for muscle spasms. 30 tablet 0  . gabapentin (NEURONTIN) 600 MG tablet Take 600 mg by mouth 2 (two) times  daily.     Marland Kitchen HYDROcodone-acetaminophen (NORCO) 7.5-325 MG per tablet Take 1 tablet by mouth every 6 (six) hours as needed. for pain  0  . lisinopril-hydrochlorothiazide (ZESTORETIC) 10-12.5 MG tablet Take 1 tablet by mouth daily. 30 tablet 2  . nortriptyline (PAMELOR) 10 MG capsule Take 2 capsules (20 mg total) by mouth at bedtime. 180 capsule 4  . Vitamin D, Ergocalciferol, (DRISDOL) 50000 UNITS CAPS capsule Take 50,000 Units by mouth once a week.  4   No current facility-administered medications on file prior to visit.     Past Medical History:  Diagnosis Date  . Anemia   . Arthritis    "plenty" (August 19, 2012)  . Asthma   . Chronic lower back pain    "they say I need a whole new spinal column" (19-Aug-2012)  . COPD (chronic obstructive pulmonary disease) ([place])   . Degenerative arthritis    "all over" (19-Aug-2012)  . GERD (gastroesophageal reflux disease)   . Gout 05/05/2013  . Hypertension   . Migraines    "used to; totally stopped when I quit drinking 30 yr ago" (19-Aug-2012)  . Myalgia and myositis   . Myocardial infarction 1992   08/19/2012 "mild MI when son died "  . Obesity   . Osteoporosis    "all over" (08-19-12)  . Shortness of breath    when stressed.  . Sleep apnea    "never did fix my machine; haven't used one for 5 years; I've lost 116# since then; no problems now" (2012/08/19)  . Type II diabetes mellitus ([place])    "borderline; don't test; take Metformin" (Aug 19, 2012)    Past Surgical History:  Procedure Laterality Date  . ABDOMINAL HYSTERECTOMY  1990's  . GANGLION CYST EXCISION Right 1980's   "wrist" (08/19/12)  . JOINT REPLACEMENT    . KNEE LIGAMENT RECONSTRUCTION Right 1980's  . TONSILLECTOMY  ~ 1954  . TOTAL HIP ARTHROPLASTY Left 19-Aug-2012  . TOTAL HIP ARTHROPLASTY Left 08-19-12   Procedure: TOTAL HIP ARTHROPLASTY;  Surgeon: Garald Balding, MD;  Location: [place];  Service: Orthopedics;  Laterality: Left;  Left Total Hip Arthroplasty  . TOTAL HIP ARTHROPLASTY Left  08/28/2012   Procedure: Irrigation and Debridement hip ;  Surgeon: Mcarthur Rossetti, MD;  Location: [place];  Service: Orthopedics;  Laterality: Left;  . TOTAL KNEE ARTHROPLASTY Left 2001  . TOTAL KNEE ARTHROPLASTY Right 2004    Social History   Social History  . Marital status: Widowed    Spouse name: N/A  . Number of children: 5  . Years of education: PhD   Occupational History  . Retired    Social History Main Topics  . Smoking status: Former Smoker    Packs/day: 0.12    Years: 2.00    Types: Cigarettes  . Smokeless tobacco: Never Used     Comment: Aug 19, 2012 "quit 30 years ago"  . Alcohol use No     Comment: 08-19-2012 "used  to be a beeralcoholic"; stopped ~ 30 yr ago"  . Drug use: No  . Sexual activity: No   Other Topics Concern  . None   Social History Narrative   Lives at home with her mother and caregiver.   Occasional use of caffeine.   Right-handed.    Family History  Problem Relation Age of Onset  . Arthritis Mother   . Arthritis Father   . Colon cancer Father   . Diabetes Sister   . Arthritis Sister   . Diabetes Maternal Grandmother   . Arthritis Maternal Grandmother   . Diabetes Maternal Grandfather   . Arthritis Maternal Grandfather         Review of Systems  Constitutional: Negative for fever, malaise/fatigue and weight loss.  HENT: Negative for congestion and sore throat.   Eyes:       Negative for visual changes  Respiratory: Negative for cough and shortness of breath.   Cardiovascular: Negative for chest pain, palpitations and leg swelling.  Gastrointestinal: Negative for blood in stool, constipation, diarrhea and heartburn.  Genitourinary: Negative for dysuria, frequency and urgency.  Musculoskeletal: Positive for back pain, falls, joint pain and neck pain. Negative for myalgias.  Skin: Negative for rash.  Neurological: Negative for dizziness, sensory change and headaches.  Endo/Heme/Allergies: Does not bruise/bleed easily.    Psychiatric/Behavioral: Negative for depression, memory loss, substance abuse and suicidal ideas. The patient is not nervous/anxious.     Objective:   Vitals:   05/01/16 1107  BP: 130/80  Pulse: 84  Temp: 98 F (36.7 C)    Body mass index is 37.91 kg/m.   Physical Examination:  Physical Exam  Constitutional: She is oriented to person, place, and time and well-developed, well-nourished, and in no distress. No distress.  HENT:  Right Ear: External ear normal.  Left Ear: External ear normal.  Nose: Nose normal.  Mouth/Throat: Oropharynx is clear and moist. No oropharyngeal exudate.  Eyes: No scleral icterus.  Neck: Normal range of motion. Neck supple. No thyromegaly present.  Cardiovascular: Normal rate and normal heart sounds.   Pulmonary/Chest: Effort normal and breath sounds normal. She exhibits no tenderness.  Abdominal: Soft. She exhibits no distension.  Musculoskeletal: She exhibits no edema.  Lymphadenopathy:    She has no cervical adenopathy.  Neurological: She is alert and oriented to person, place, and time.  Psychiatric: Affect and judgment normal.    ASSESSMENT and PLAN:  [place] was seen today for new patient (initial visit).  Diagnoses and all orders for this visit:  Chronic pain syndrome -     Ambulatory referral to Pain Clinic  Hypertension, unspecified type  Polyp of colon, unspecified part of colon, unspecified type -     Ambulatory referral to Gastroenterology  Colon cancer screening -     Ambulatory referral to Gastroenterology  Sleep apnea, unspecified type   Sleep apnea Managed by neurologist: Dr. Krista Blue. CPAP use  Hypertension Controlled with lisinopril HCTZ     Follow up: Return in about 1 month (around 06/01/2016) for HTN, CKD, hyperlipidemia.Wilfred Lacy, NP

## 2016-05-01 NOTE — Assessment & Plan Note (Signed)
Controlled with lisinopril HCTZ

## 2016-05-01 NOTE — Assessment & Plan Note (Addendum)
Managed by neurologist: Dr. Krista Blue. CPAP use

## 2016-05-16 ENCOUNTER — Ambulatory Visit (INDEPENDENT_AMBULATORY_CARE_PROVIDER_SITE_OTHER): Payer: Medicare Other | Admitting: Orthopaedic Surgery

## 2016-05-16 ENCOUNTER — Encounter (INDEPENDENT_AMBULATORY_CARE_PROVIDER_SITE_OTHER): Payer: Self-pay | Admitting: Orthopaedic Surgery

## 2016-05-16 VITALS — BP 130/82 | HR 84 | Resp 16 | Ht 60.0 in | Wt 207.0 lb

## 2016-05-16 DIAGNOSIS — M79671 Pain in right foot: Secondary | ICD-10-CM

## 2016-05-16 NOTE — Progress Notes (Signed)
Office Visit Note   Patient: Sandra Brennan           Date of Birth: Feb 15, 1946           MRN: [id number] Visit Date: 05/16/2016              Requested by: Vincente Liberty, MD [address] [place], [place] [address] PCP: Wilfred Lacy, NP   Assessment & Plan: Visit Diagnoses: Osteoarthritis right foot and ankle  Plan: Continue with home exercises, continue to wear either the equalizer boot or the ASO ankle support. Follow up as needed. Long discussion regarding weight loss and her pathology in regards to the endstage osteoarthritis of both the foot and the ankle  Follow-Up Instructions: No Follow-up on file.   Orders:  No orders of the defined types were placed in this encounter.  No orders of the defined types were placed in this encounter.     Procedures: No procedures performed   Clinical Data: No additional findings.   Subjective: No chief complaint on file.   Ms. Markwood is a 71 year old african [place] female that is here today following up on a fall (unconscious)she had on 03/30/16.Marland Kitchen She was placed in the equalizer boot and she relates it is heavy and makes her foot hurt. She does have have edema at the end of the day from being on her feet. Pt ambulates with a cane  Has been referred to a pain clinic per her primary care physician. Prior films demonstrate end-stage osteoarthritis of right ankle and subtalar joint and midfoot Review of Systems   Objective: Vital Signs: There were no vitals taken for this visit.  Physical Exam  Ortho Exam right foot with considerable loss of subtalar and ankle range of motion   Dorsal bunion at great toe. Good sensibility. Good capillary refill to toes. No arch with foot. Palpable osteophytes mid dorsum of midfoot. no pain to palpation along either malleolus  Imaging: No results found.   PMFS History: Patient Active Problem List   Diagnosis Date Noted  . Closed right ankle fracture 05/01/2016  . Colon polyps  05/01/2016  . Chronic pain syndrome 05/01/2016  . Syncope 03/30/2016  . Chest pain 03/30/2016  . Fall 03/30/2016  . Obesity 05/06/2013  . Acute nontraumatic kidney injury ([place]) 05/05/2013  . Acute posthemorrhagic anemia 09/06/2012  . Gout 09/05/2012  . Osteoarthritis of left hip 08/05/2012  . Diabetes mellitus type 2 in obese ([place]) 08/05/2012  . Hypertension 08/05/2012  . Asthma, chronic 08/05/2012  . Sleep apnea 08/05/2012   Past Medical History:  Diagnosis Date  . Anemia   . Arthritis    "plenty" (08/10/12)  . Asthma   . Chronic lower back pain    "they say I need a whole new spinal column" (2012/08/10)  . COPD (chronic obstructive pulmonary disease) ([place])   . Degenerative arthritis    "all over" (10-Aug-2012)  . GERD (gastroesophageal reflux disease)   . Gout 05/05/2013  . Hypertension   . Migraines    "used to; totally stopped when I quit drinking 30 yr ago" (2012/08/10)  . Myalgia and myositis   . Myocardial infarction 1992   08-10-2012 "mild MI when son died "  . Obesity   . Osteoporosis    "all over" (2012-08-10)  . Shortness of breath    when stressed.  . Sleep apnea    "never did fix my machine; haven't used one for 5 years; I've lost 116# since then; no  problems now" (08/03/2012)  . Type II diabetes mellitus ([place])    "borderline; don't test; take Metformin" (08/03/2012)    Family History  Problem Relation Age of Onset  . Arthritis Mother   . Arthritis Father   . Colon cancer Father   . Diabetes Sister   . Arthritis Sister   . Diabetes Maternal Grandmother   . Arthritis Maternal Grandmother   . Diabetes Maternal Grandfather   . Arthritis Maternal Grandfather     Past Surgical History:  Procedure Laterality Date  . ABDOMINAL HYSTERECTOMY  1990's  . GANGLION CYST EXCISION Right 1980's   "wrist" (08/03/2012)  . JOINT REPLACEMENT    . KNEE LIGAMENT RECONSTRUCTION Right 1980's  . TONSILLECTOMY  ~ 1954  . TOTAL HIP ARTHROPLASTY Left 08/03/2012  . TOTAL HIP ARTHROPLASTY  Left 08/03/2012   Procedure: TOTAL HIP ARTHROPLASTY;  Surgeon: Garald Balding, MD;  Location: [place];  Service: Orthopedics;  Laterality: Left;  Left Total Hip Arthroplasty  . TOTAL HIP ARTHROPLASTY Left 08/28/2012   Procedure: Irrigation and Debridement hip ;  Surgeon: Mcarthur Rossetti, MD;  Location: [place];  Service: Orthopedics;  Laterality: Left;  . TOTAL KNEE ARTHROPLASTY Left 2001  . TOTAL KNEE ARTHROPLASTY Right 2004   Social History   Occupational History  . Retired    Social History Main Topics  . Smoking status: Former Smoker    Packs/day: 0.12    Years: 2.00    Types: Cigarettes  . Smokeless tobacco: Never Used     Comment: 08/03/2012 "quit 30 years ago"  . Alcohol use No     Comment: 08/03/2012 "used to be a beeralcoholic"; stopped ~ 30 yr ago"  . Drug use: No  . Sexual activity: No

## 2016-06-02 ENCOUNTER — Encounter: Payer: Self-pay | Admitting: Nurse Practitioner

## 2016-06-02 ENCOUNTER — Other Ambulatory Visit (INDEPENDENT_AMBULATORY_CARE_PROVIDER_SITE_OTHER): Payer: Medicare Other

## 2016-06-02 ENCOUNTER — Ambulatory Visit (INDEPENDENT_AMBULATORY_CARE_PROVIDER_SITE_OTHER): Payer: Medicare Other | Admitting: Nurse Practitioner

## 2016-06-02 VITALS — BP 116/62 | HR 88 | Temp 97.8°F | Ht 60.0 in | Wt 206.0 lb

## 2016-06-02 DIAGNOSIS — M1A00X0 Idiopathic chronic gout, unspecified site, without tophus (tophi): Secondary | ICD-10-CM

## 2016-06-02 DIAGNOSIS — N179 Acute kidney failure, unspecified: Secondary | ICD-10-CM | POA: Diagnosis not present

## 2016-06-02 DIAGNOSIS — Z23 Encounter for immunization: Secondary | ICD-10-CM | POA: Diagnosis not present

## 2016-06-02 DIAGNOSIS — I10 Essential (primary) hypertension: Secondary | ICD-10-CM

## 2016-06-02 DIAGNOSIS — E559 Vitamin D deficiency, unspecified: Secondary | ICD-10-CM | POA: Diagnosis not present

## 2016-06-02 DIAGNOSIS — E782 Mixed hyperlipidemia: Secondary | ICD-10-CM | POA: Diagnosis not present

## 2016-06-02 DIAGNOSIS — E785 Hyperlipidemia, unspecified: Secondary | ICD-10-CM | POA: Insufficient documentation

## 2016-06-02 DIAGNOSIS — J45909 Unspecified asthma, uncomplicated: Secondary | ICD-10-CM

## 2016-06-02 DIAGNOSIS — Z1159 Encounter for screening for other viral diseases: Secondary | ICD-10-CM

## 2016-06-02 DIAGNOSIS — E1142 Type 2 diabetes mellitus with diabetic polyneuropathy: Secondary | ICD-10-CM | POA: Diagnosis not present

## 2016-06-02 DIAGNOSIS — Z780 Asymptomatic menopausal state: Secondary | ICD-10-CM

## 2016-06-02 DIAGNOSIS — J302 Other seasonal allergic rhinitis: Secondary | ICD-10-CM

## 2016-06-02 DIAGNOSIS — E1169 Type 2 diabetes mellitus with other specified complication: Secondary | ICD-10-CM | POA: Insufficient documentation

## 2016-06-02 LAB — BASIC METABOLIC PANEL
BUN: 21 mg/dL (ref 6–23)
CHLORIDE: 104 meq/L (ref 96–112)
CO2: 31 mEq/L (ref 19–32)
Calcium: 9.3 mg/dL (ref 8.4–10.5)
Creatinine, Ser: 1.33 mg/dL — ABNORMAL HIGH (ref 0.40–1.20)
GFR: 50.88 mL/min — AB (ref >60.00)
GLUCOSE: 85 mg/dL (ref 70–99)
POTASSIUM: 4 meq/L (ref 3.5–5.1)
SODIUM: 141 meq/L (ref 135–145)

## 2016-06-02 LAB — URIC ACID: URIC ACID, SERUM: 7.6 mg/dL — AB (ref 2.4–7.0)

## 2016-06-02 LAB — HEPATITIS C ANTIBODY: HCV Ab: NEGATIVE

## 2016-06-02 LAB — CK TOTAL AND CKMB (NOT AT ARMC)
CK TOTAL: 105 U/L (ref 7–177)
CK, MB: 1.4 ng/mL (ref 0.0–5.0)
Relative Index: 1.3 (ref 0.0–4.0)

## 2016-06-02 MED ORDER — FLUTICASONE PROPIONATE 50 MCG/ACT NA SUSP
2.0000 | Freq: Every day | NASAL | 0 refills | Status: DC
Start: 2016-06-02 — End: 2017-02-26

## 2016-06-02 MED ORDER — CETIRIZINE HCL 10 MG PO TABS
10.0000 mg | ORAL_TABLET | Freq: Every day | ORAL | 0 refills | Status: DC
Start: 2016-06-02 — End: 2017-08-31

## 2016-06-02 NOTE — Progress Notes (Signed)
Subjective:  Patient ID: Sandra Brennan, female    DOB: 24-Oct-1945  Age: 71 y.o. MRN: [id number]  CC: Follow-up (1 mo fu/)   HPI last mammogram 02/2016: normal per patient, done by [facility].   Diabetes: Controlled, no medication used at this time. Does not check glucose at home.  upcoming eye exam with dr. Lanell Matar.  HTN: Controlled.  Hyperlipidemia: No myalgia or weakness. Current use of lipitor.  Arthritis: Pain controlled with colchicine and norco. Persistent right ankle pain. Managed by ortho. Use of ankle brace at this time.  Asthma: Controlled. Has not used albuterol in several months. Has occasional sneezing and coughing. No SOB, no edema, no PND.  Outpatient Medications Prior to Visit  Medication Sig Dispense Refill  . albuterol (PROVENTIL HFA;VENTOLIN HFA) 108 (90 BASE) MCG/ACT inhaler Inhale 2 puffs into the lungs every 6 (six) hours as needed for wheezing.    Marland Kitchen aspirin EC 81 MG tablet Take 81 mg by mouth daily.    Marland Kitchen atorvastatin (LIPITOR) 10 MG tablet     . colchicine 0.6 MG tablet Take 0.6 mg by mouth 2 (two) times daily.    . cyclobenzaprine (FLEXERIL) 5 MG tablet Take 1 tablet (5 mg total) by mouth 3 (three) times daily as needed for muscle spasms. 30 tablet 0  . gabapentin (NEURONTIN) 600 MG tablet Take 600 mg by mouth 2 (two) times daily.     Marland Kitchen HYDROcodone-acetaminophen (NORCO) 7.5-325 MG per tablet Take 1 tablet by mouth every 6 (six) hours as needed. for pain  0  . lisinopril-hydrochlorothiazide (ZESTORETIC) 10-12.5 MG tablet Take 1 tablet by mouth daily. 30 tablet 2  . nortriptyline (PAMELOR) 10 MG capsule Take 2 capsules (20 mg total) by mouth at bedtime. 180 capsule 4  . Vitamin D, Ergocalciferol, (DRISDOL) 50000 UNITS CAPS capsule Take 50,000 Units by mouth once a week.  4   No facility-administered medications prior to visit.     ROS See HPI  Objective:  BP 116/62   Pulse 88   Temp 97.8 F (36.6 C)   Ht 5' (1.524 m)   Wt 206 lb (93.4 kg)    SpO2 96%   BMI 40.23 kg/m   BP Readings from Last 3 Encounters:  06/02/16 116/62  05/16/16 130/82  05/01/16 130/80    Wt Readings from Last 3 Encounters:  06/02/16 206 lb (93.4 kg)  05/16/16 207 lb (93.9 kg)  05/01/16 207 lb 4 oz (94 kg)    Physical Exam  Constitutional: She is oriented to person, place, and time. No distress.  Cardiovascular: Normal rate and normal heart sounds.   Pulmonary/Chest: Effort normal and breath sounds normal.  Musculoskeletal: She exhibits edema and tenderness.  Neurological: She is alert and oriented to person, place, and time.  Diabetic foot exam done today: abnormal (decreased, R<L).  Skin: Skin is dry.  Vitals reviewed.   Lab Results  Component Value Date   WBC 5.0 03/31/2016   HGB 9.1 (L) 03/31/2016   HCT 28.9 (L) 03/31/2016   PLT 160 03/31/2016   GLUCOSE 85 06/02/2016   CHOL 115 03/30/2016   TRIG 51 03/30/2016   HDL 68 03/30/2016   LDLCALC 37 03/30/2016   ALT 44 03/30/2016   AST 46 (H) 03/30/2016   NA 141 06/02/2016   K 4.0 06/02/2016   CL 104 06/02/2016   CREATININE 1.33 (H) 06/02/2016   BUN 21 06/02/2016   CO2 31 06/02/2016   INR 1.17 08/27/2012   HGBA1C 5.4 03/30/2016  Dg Chest 2 View  Result Date: 03/30/2016 CLINICAL DATA:  Patient with syncopal episode and church. EXAM: CHEST  2 VIEW COMPARISON:  Chest radiograph 07/15/2014. FINDINGS: Normal cardiac and mediastinal contours. No consolidative pulmonary opacities. No pleural effusion or pneumothorax. Thoracic spine degenerative changes. Extensive bilateral glenohumeral joint degenerative changes/AVN. IMPRESSION: No active cardiopulmonary disease. Electronically Signed   By: Lovey Newcomer M.D.   On: 03/30/2016 10:24   Ct Head Wo Contrast  Result Date: 03/30/2016 CLINICAL DATA:  Status post fall with neck stiffness and possible loss of consciousness. EXAM: CT HEAD WITHOUT CONTRAST CT CERVICAL SPINE WITHOUT CONTRAST TECHNIQUE: Multidetector CT imaging of the head and cervical  spine was performed following the standard protocol without intravenous contrast. Multiplanar CT image reconstructions of the cervical spine were also generated. COMPARISON:  MRI of the cervical spine 09/15/2015, CT of the head 11/17/2008 FINDINGS: CT HEAD FINDINGS Brain: No evidence of acute intracranial hemorrhage, hydrocephalus, extra-axial collection or mass lesion/mass effect. There is diffusely hypoattenuated appearance of the right temporal lobe. Vascular: Calcific atherosclerotic disease at the skullbase. Skull: Normal. Negative for fracture or focal lesion. Sinuses/Orbits: Minimal opacification of the right mastoid air cells. Mild mucosal thickening of the bilateral ethmoid air persists. Other: None. CT CERVICAL SPINE FINDINGS Alignment: Reversal of physiologic lordosis, likely due to extensive osteoarthritic changes. Skull base and vertebrae: No acute fracture. No primary bone lesion or focal pathologic process. Soft tissues and spinal canal: No prevertebral fluid or swelling. No visible canal hematoma. Disc levels: Multilevel osteoarthritic changes with narrowing of the disc spaces, endplate sclerosis, degenerative remodeling of vertebral bodies. Light congenital ankylosis of C2-C3. Upper chest: Negative. Other: None. IMPRESSION: No evidence of acute intracranial injury. Diffusely hypoattenuated appearance of the right temple lower lobe. This portion of the brain is prone to artifacts and therefore this may be artifactual, however ischemic infarction cannot be excluded. If clinical symptoms support possible infarction of the right temporal lobe, MRI of the brain should be considered. No evidence of acute traumatic injury to the cervical spine. Straightening of cervical lordosis with extensive multilevel osteoarthritic changes and segmentation anomaly at C2-C3. Electronically Signed   By: Fidela Salisbury M.D.   On: 03/30/2016 11:14   Ct Cervical Spine Wo Contrast  Result Date: 03/30/2016 CLINICAL  DATA:  Status post fall with neck stiffness and possible loss of consciousness. EXAM: CT HEAD WITHOUT CONTRAST CT CERVICAL SPINE WITHOUT CONTRAST TECHNIQUE: Multidetector CT imaging of the head and cervical spine was performed following the standard protocol without intravenous contrast. Multiplanar CT image reconstructions of the cervical spine were also generated. COMPARISON:  MRI of the cervical spine 09/15/2015, CT of the head 11/17/2008 FINDINGS: CT HEAD FINDINGS Brain: No evidence of acute intracranial hemorrhage, hydrocephalus, extra-axial collection or mass lesion/mass effect. There is diffusely hypoattenuated appearance of the right temporal lobe. Vascular: Calcific atherosclerotic disease at the skullbase. Skull: Normal. Negative for fracture or focal lesion. Sinuses/Orbits: Minimal opacification of the right mastoid air cells. Mild mucosal thickening of the bilateral ethmoid air persists. Other: None. CT CERVICAL SPINE FINDINGS Alignment: Reversal of physiologic lordosis, likely due to extensive osteoarthritic changes. Skull base and vertebrae: No acute fracture. No primary bone lesion or focal pathologic process. Soft tissues and spinal canal: No prevertebral fluid or swelling. No visible canal hematoma. Disc levels: Multilevel osteoarthritic changes with narrowing of the disc spaces, endplate sclerosis, degenerative remodeling of vertebral bodies. Light congenital ankylosis of C2-C3. Upper chest: Negative. Other: None. IMPRESSION: No evidence of acute intracranial injury. Diffusely hypoattenuated  appearance of the right temple lower lobe. This portion of the brain is prone to artifacts and therefore this may be artifactual, however ischemic infarction cannot be excluded. If clinical symptoms support possible infarction of the right temporal lobe, MRI of the brain should be considered. No evidence of acute traumatic injury to the cervical spine. Straightening of cervical lordosis with extensive  multilevel osteoarthritic changes and segmentation anomaly at C2-C3. Electronically Signed   By: Fidela Salisbury M.D.   On: 03/30/2016 11:14   Mr Brain Wo Contrast  Result Date: 03/30/2016 CLINICAL DATA:  71 y/o F; status post fall with abnormal CT of the head. EXAM: MRI HEAD WITHOUT CONTRAST TECHNIQUE: Multiplanar, multiecho pulse sequences of the brain and surrounding structures were obtained without intravenous contrast. COMPARISON:  03/30/2016 CT of the head. 08/03/2014 MRI of the brain. FINDINGS: Brain: No acute infarction, hemorrhage, hydrocephalus, extra-axial collection or mass lesion. Few scattered foci of T2 FLAIR hyperintensity in subcortical white matter are compatible with minimal chronic microvascular ischemic changes without significant interval change. Vascular: Normal flow voids. Skull and upper cervical spine: Normal marrow signal. Hyperostosis frontalis interna. Sinuses/Orbits: Trace bilateral mastoid effusions. Mild diffuse paranasal sinus mucosal thickening greatest in the ethmoid air cells. Orbits are unremarkable. Other: None. IMPRESSION: 1. No acute intracranial abnormality. 2. No abnormal signal within the temporal lobes, hypoattenuation on CT is artifactual. 3. Minimal chronic microvascular ischemic changes without significant interval change. 4. Mild paranasal sinus disease and trace mastoid effusions. Electronically Signed   By: Kristine Garbe M.D.   On: 03/30/2016 17:35   Nm Myocar Multi W/spect W/wall Motion / Ef  Result Date: 03/31/2016 CLINICAL DATA:  71 year old with chest pain. History of myocardial infarction, diabetes and hypertension. EXAM: MYOCARDIAL IMAGING WITH SPECT (REST AND PHARMACOLOGIC-STRESS) GATED LEFT VENTRICULAR WALL MOTION STUDY LEFT VENTRICULAR EJECTION FRACTION TECHNIQUE: Standard myocardial SPECT imaging was performed after resting intravenous injection of 10 mCi Tc-75m tetrofosmin. Subsequently, intravenous infusion of Lexiscan was performed  under the supervision of the Cardiology staff. At peak effect of the drug, 30 mCi Tc-57m tetrofosmin was injected intravenously and standard myocardial SPECT imaging was performed. Quantitative gated imaging was also performed to evaluate left ventricular wall motion, and estimate left ventricular ejection fraction. COMPARISON:  Chest radiographs 03/30/2016 FINDINGS: Perfusion: The perfusion appears mildly heterogeneous, especially on the rest images. There is a small fixed perfusion defect in the lateral wall. There is a possible small area of reversibility within the mid to distal anterior septum. Wall Motion: Generalized moderate hypokinesis. No focal wall motion abnormalities are seen. Left Ventricular Ejection Fraction: 39 % End diastolic volume 578 ml End systolic volume 75 ml IMPRESSION: 1. Heterogeneous myocardial perfusion with probable small infarct in the lateral wall. Cannot exclude ischemia in the mid to distal anterior septum. 2. Global hypokinesis. 3. Left ventricular ejection fraction 39% 4. Non invasive risk stratification*: Intermediate *2012 Appropriate Use Criteria for Coronary Revascularization Focused Update: J Am Coll Cardiol. 4696;29(5):284-132. http://[email or web address] Electronically Signed   By: Richardean Sale M.D.   On: 03/31/2016 13:30    Assessment & Plan:   Sandra Brennan was seen today for follow-up.  Diagnoses and all orders for this visit:  Hypertension, unspecified type -     Basic metabolic panel; Future  Asymptomatic postmenopausal estrogen deficiency -     DG Bone Density; Future  Type 2 diabetes mellitus with diabetic polyneuropathy, without long-term current use of insulin (HCC) -     Basic metabolic panel; Future  Chronic asthma without complication, unspecified asthma  severity, unspecified whether persistent  Encounter for hepatitis C screening test for low risk patient -     Hepatitis C Antibody; Future  Chronic seasonal  allergic rhinitis, unspecified trigger -     cetirizine (ZYRTEC) 10 MG tablet; Take 1 tablet (10 mg total) by mouth daily. -     fluticasone (FLONASE) 50 MCG/ACT nasal spray; Place 2 sprays into both nostrils daily.  Vitamin D deficiency -     Vitamin D 1,25 dihydroxy; Future  Acute nontraumatic kidney injury ([place]) -     Basic metabolic panel; Future  Mixed hyperlipidemia -     CK Total (and CKMB); Future  Chronic gouty arthritis -     Uric acid; Future  Need for vaccination with 13-polyvalent pneumococcal conjugate vaccine -     Pneumococcal conjugate vaccine 13-valent IM   I am having Sandra Brennan start on cetirizine and fluticasone. I am also having her maintain her albuterol, colchicine, aspirin EC, gabapentin, Vitamin D (Ergocalciferol), HYDROcodone-acetaminophen, atorvastatin, nortriptyline, cyclobenzaprine, and lisinopril-hydrochlorothiazide.  Meds ordered this encounter  Medications  . cetirizine (ZYRTEC) 10 MG tablet    Sig: Take 1 tablet (10 mg total) by mouth daily.    Dispense:  30 tablet    Refill:  0    Order Specific Question:   Supervising Provider    Answer:   Cassandria Anger [[id number]]  . fluticasone (FLONASE) 50 MCG/ACT nasal spray    Sig: Place 2 sprays into both nostrils daily.    Dispense:  16 g    Refill:  0    Order Specific Question:   Supervising Provider    Answer:   Cassandria Anger [[id number]]    Follow-up: Return in about 3 months (around 09/01/2016) for DM and HTN, hyperlipidemia.  Wilfred Lacy, NP

## 2016-06-02 NOTE — Progress Notes (Signed)
Pre visit review using our clinic review tool, if applicable. No additional management support is needed unless otherwise documented below in the visit note. 

## 2016-06-02 NOTE — Patient Instructions (Addendum)
Had AWV done by [facility].  Does not need medication refill at this time.  Please have previous colonoscopy, opthalmology exam and mammogram report faxed to me.

## 2016-06-04 ENCOUNTER — Telehealth: Payer: Self-pay | Admitting: Nurse Practitioner

## 2016-06-04 DIAGNOSIS — M1A00X0 Idiopathic chronic gout, unspecified site, without tophus (tophi): Secondary | ICD-10-CM | POA: Insufficient documentation

## 2016-06-04 DIAGNOSIS — M1A9XX0 Chronic gout, unspecified, without tophus (tophi): Secondary | ICD-10-CM | POA: Insufficient documentation

## 2016-06-04 LAB — VITAMIN D 1,25 DIHYDROXY
VITAMIN D 1, 25 (OH) TOTAL: 15 pg/mL — AB (ref 18–72)
VITAMIN D2 1, 25 (OH): 15 pg/mL

## 2016-06-04 MED ORDER — VITAMIN D (ERGOCALCIFEROL) 1.25 MG (50000 UNIT) PO CAPS: 50000.0000 [IU] | ORAL_CAPSULE | ORAL | 1 refills | Status: DC

## 2016-06-04 MED ORDER — ATORVASTATIN CALCIUM 10 MG PO TABS: 10.0000 mg | ORAL_TABLET | Freq: Every day | ORAL | 2 refills | Status: DC

## 2016-06-04 MED ORDER — ALLOPURINOL 100 MG PO TABS: 100.0000 mg | ORAL_TABLET | Freq: Two times a day (BID) | ORAL | 1 refills | Status: DC

## 2016-06-04 NOTE — Telephone Encounter (Signed)
Pt request refill for atorvastatin (LIPITOR) 10 MG tablet send to [facility]. Please advise, we never send this med in for her yet.

## 2016-06-04 NOTE — Telephone Encounter (Signed)
Ok to send

## 2016-06-04 NOTE — Telephone Encounter (Signed)
rx sent

## 2016-06-09 ENCOUNTER — Ambulatory Visit (HOSPITAL_BASED_OUTPATIENT_CLINIC_OR_DEPARTMENT_OTHER): Payer: Medicare Other | Admitting: Physical Medicine & Rehabilitation

## 2016-06-09 ENCOUNTER — Encounter: Payer: Medicare Other | Attending: Physical Medicine & Rehabilitation

## 2016-06-09 ENCOUNTER — Encounter: Payer: Self-pay | Admitting: Physical Medicine & Rehabilitation

## 2016-06-09 VITALS — BP 114/78 | HR 78

## 2016-06-09 DIAGNOSIS — M109 Gout, unspecified: Secondary | ICD-10-CM | POA: Diagnosis not present

## 2016-06-09 DIAGNOSIS — J449 Chronic obstructive pulmonary disease, unspecified: Secondary | ICD-10-CM | POA: Diagnosis not present

## 2016-06-09 DIAGNOSIS — M48061 Spinal stenosis, lumbar region without neurogenic claudication: Secondary | ICD-10-CM | POA: Insufficient documentation

## 2016-06-09 DIAGNOSIS — M19071 Primary osteoarthritis, right ankle and foot: Secondary | ICD-10-CM | POA: Insufficient documentation

## 2016-06-09 DIAGNOSIS — I10 Essential (primary) hypertension: Secondary | ICD-10-CM | POA: Insufficient documentation

## 2016-06-09 DIAGNOSIS — E669 Obesity, unspecified: Secondary | ICD-10-CM | POA: Insufficient documentation

## 2016-06-09 DIAGNOSIS — M797 Fibromyalgia: Secondary | ICD-10-CM | POA: Insufficient documentation

## 2016-06-09 DIAGNOSIS — M47816 Spondylosis without myelopathy or radiculopathy, lumbar region: Secondary | ICD-10-CM | POA: Diagnosis not present

## 2016-06-09 DIAGNOSIS — Z87891 Personal history of nicotine dependence: Secondary | ICD-10-CM | POA: Diagnosis not present

## 2016-06-09 DIAGNOSIS — M4802 Spinal stenosis, cervical region: Secondary | ICD-10-CM

## 2016-06-09 DIAGNOSIS — Z8261 Family history of arthritis: Secondary | ICD-10-CM | POA: Diagnosis not present

## 2016-06-09 DIAGNOSIS — Z96653 Presence of artificial knee joint, bilateral: Secondary | ICD-10-CM | POA: Diagnosis not present

## 2016-06-09 DIAGNOSIS — Z6841 Body Mass Index (BMI) 40.0 and over, adult: Secondary | ICD-10-CM | POA: Diagnosis not present

## 2016-06-09 DIAGNOSIS — Z96642 Presence of left artificial hip joint: Secondary | ICD-10-CM | POA: Insufficient documentation

## 2016-06-09 DIAGNOSIS — Z833 Family history of diabetes mellitus: Secondary | ICD-10-CM | POA: Insufficient documentation

## 2016-06-09 DIAGNOSIS — M19171 Post-traumatic osteoarthritis, right ankle and foot: Secondary | ICD-10-CM

## 2016-06-09 DIAGNOSIS — E119 Type 2 diabetes mellitus without complications: Secondary | ICD-10-CM | POA: Insufficient documentation

## 2016-06-09 DIAGNOSIS — M545 Low back pain: Secondary | ICD-10-CM | POA: Diagnosis present

## 2016-06-09 DIAGNOSIS — I252 Old myocardial infarction: Secondary | ICD-10-CM | POA: Diagnosis not present

## 2016-06-09 DIAGNOSIS — M810 Age-related osteoporosis without current pathological fracture: Secondary | ICD-10-CM | POA: Diagnosis not present

## 2016-06-09 DIAGNOSIS — K219 Gastro-esophageal reflux disease without esophagitis: Secondary | ICD-10-CM | POA: Diagnosis not present

## 2016-06-09 MED ORDER — TRAMADOL HCL 50 MG PO TABS: 50.0000 mg | ORAL_TABLET | Freq: Two times a day (BID) | ORAL | 0 refills | Status: DC

## 2016-06-09 MED ORDER — PREGABALIN 50 MG PO CAPS: 50.0000 mg | ORAL_CAPSULE | Freq: Two times a day (BID) | ORAL | 0 refills | Status: DC

## 2016-06-09 NOTE — Progress Notes (Signed)
Subjective:    Patient ID: Sandra Brennan, female    DOB: [birth year], 71 y.o.   MRN: [id number]  HPI CC:  Low back and neck pain, shoulders, Right foot and left knee  Her Right foot pain is constant, has "arthritis" in right foot aggravated by a fall ~3wks ago, seen by ortho, Dr Durward Fortes, Xrays showed no fracture, uses cam boot but it makes her ankle sore Uses cane to ambulate  Right shoulder pain which increases when using cane, seen by ortho, rec shoulder replacement, had a shoulder injection in past with some benefit.    Has had back injections in past which were helpful, single injection under xray guidance  Hx diabetic neuropathy  Doesn't like to take hydrocodone , doesn't like the way it makes her feel  Pain Inventory Average Pain 9 Pain Right Now 7 My pain is sharp, burning, stabbing and tingling  In the last 24 hours, has pain interfered with the following? General activity 5 Relation with others 5 Enjoyment of life 9 What TIME of day is your pain at its worst? daytime Sleep (in general) Fair  Pain is worse with: walking, sitting and standing Pain improves with: rest and medication Relief from Meds: 5  Mobility use a cane ability to climb steps?  yes do you drive?  no  Function retired  Neuro/Psych bladder control problems numbness tingling trouble walking  Prior Studies Any changes since last visit?  no EXAM: MRI LUMBAR SPINE WITHOUT CONTRAST  TECHNIQUE: Multiplanar, multisequence MR imaging was performed. No intravenous contrast was administered.  COMPARISON:  CT abdomen 05/05/2013.  MRI 03/27/2009.  FINDINGS: T12-L1: Endplate osteophytes and mild bulging of the disc. Mild facet and ligamentous hypertrophy. Mild narrowing of the canal but no apparent compression of the cord.  L1-2: Chronic disc degeneration with loss of disc height. Chronic endplate marrow changes. Prominent endplate osteophytes encroach upon the canal, effacing the  subarachnoid space in crowding the nerve roots of the cauda equina. The stenosis has worsened since the examination of 2011.  L2-3: Retrolisthesis of 2 mm. Endplate osteophytes and bulging of the disc. Facet and ligamentous hypertrophy. Multifactorial stenosis, particularly affecting the lateral recesses and foramina. Neural compression could occur at this level. This has worsened since the previous study.  L3-4: Endplate osteophytes and bulging of the disc. Facet and ligamentous hypertrophy. Stenosis of both lateral recesses. This has worsened since the previous study.  L4-5: Bilateral facet arthropathy with anterolisthesis of 3 mm. Facet and ligamentous hypertrophy more pronounced on the left. Bulging of the disc. Multifactorial stenosis at this level most severe at the left lateral recess. This has worsened since the previous study.  L5-S1: Minimal bulging of the disc. Bilateral facet degeneration and hypertrophy. Mild foraminal narrowing bilaterally without gross neural compression.  IMPRESSION: Worsening of degenerative changes throughout the lumbar region compared to the study of 03/27/2009.  L1-2: Chronic disc degeneration with prominent osteophytes. Spinal stenosis with crowding of the nerve roots of the cauda equina.  L2-3: Bilateral lateral recess and foraminal stenosis that could cause neural compression on either side.  L3-4:  Bilateral lateral recess stenosis that could be symptomatic.  L4-5: Severe multifactorial stenosis left worse than right. Neural compression seems likely at this level.  L5-S1:  Facet arthropathy without compressive stenosis.   Electronically Signed   By: Nelson Chimes M.D.   On: 05/25/2013 16:41  CLINICAL DATA:  Neck and RIGHT shoulder pain for 1 month. Fell 3 months ago.  EXAM: MRI CERVICAL  SPINE WITHOUT CONTRAST  TECHNIQUE: Multiplanar, multisequence MR imaging of the cervical spine was performed. No intravenous  contrast was administered.  COMPARISON:  Cervical spine radiographs September 05, 2015  FINDINGS: ALIGNMENT: Straightened cervical lordosis.  No malalignment.  VERTEBRAE/DISCS: C2-3 segmentation anomaly. Moderate C4-5 through C7-T1 disc height loss, moderate to severe chronic discogenic endplate changes Z6-1 through C6-7, moderate C7-T1. Decreased T2 signal within the cervical disc compatible with desiccation. No STIR signal abnormality to suggest acute osseous process. Borderline congenital canal narrowing on the basis of foreshortened pedicles.  CORD:Cervical spinal cord is normal morphology and signal characteristics from the cervicomedullary junction to level of T1-2, the most caudal well visualized level.  POSTERIOR FOSSA, VERTEBRAL ARTERIES, PARASPINAL TISSUES: No MR findings of ligamentous injury. Vertebral artery flow voids present. Cerebellar tonsils descend 4 mm below the foramen magnum, slightly pointed in appearance with effacement of cerebral spinal fluid space, associated with C2 synovial proliferation.  DISC LEVELS: C2-3: Segmentation anomaly. No canal stenosis or neural foraminal narrowing.  C3-4: Small central disc protrusion, uncovertebral hypertrophy and mild facet arthropathy. Mild canal stenosis. Moderate bilateral neural foraminal narrowing.  C4-5: Small broad-based disc bulge, uncovertebral hypertrophy and mild facet arthropathy. Mild canal stenosis. Mild RIGHT, moderate to severe LEFT neural foraminal narrowing.  C5-6: Small broad-based RIGHT central to subarticular disc protrusion. Uncovertebral hypertrophy. Moderate RIGHT, mild LEFT facet arthropathy. Mild to moderate canal stenosis. Moderate to severe RIGHT neural foraminal narrowing.  C6-7: Small broad-based disc bulge asymmetric to the LEFT. Uncovertebral hypertrophy and mild LEFT facet arthropathy. Mild canal stenosis. Moderate bilateral neural foraminal narrowing.  C7-T1: 3 mm  broad-based disc bulge. Uncovertebral hypertrophy and mild facet arthropathy. Mild canal stenosis. Moderate RIGHT, mild LEFT neural foraminal narrowing.  IMPRESSION: Straightened cervical lordosis without acute fracture or malalignment.  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 Mild C2 synovial proliferation resulting in crowded craniocervical junction without Chiari 1 malformation.  Mild to moderate canal stenosis C5-6, mild at C3-4, C4-5, C6-7 and C7-T1.  Neural foraminal narrowing C3-4 thru C7-T1: Moderate to severe on the LEFT at C4-5 and on the RIGHT at C5-6.   Electronically Signed   By: Elon Alas M.D.   On: 09/15/2015 20:49 CLINICAL DATA:  Chronic right ankle pain.  Remote history of trauma.  EXAM: MRI OF THE RIGHT ANKLE WITHOUT CONTRAST  TECHNIQUE: Multiplanar, multisequence MR imaging of the ankle was performed. No intravenous contrast was administered.  COMPARISON:  Plain films right ankle 07/25/2012.  FINDINGS: TENDONS  Peroneal: Intact.  Posteromedial: Intact.  Anterior: The extensor hallucis longus tendon is not visualized distal to the tibiotalar joint consistent with tear. The tibialis anterior and extensor digitorum longus tendons are intact.  Achilles: Intact.  Plantar Fascia: Intact with normal signal. Bulky plantar calcaneal spur is noted.  LIGAMENTS  Lateral: The calcaneofibular and anterior talofibular ligaments are not visualized consistent with chronic tear. Lateral ligaments otherwise appear intact.  Medial: Intact.  CARTILAGE  Ankle Joint: The patient has severe tibiotalar osteoarthritis with bone-on-bone joint space narrowing and extensive subchondral cyst formation and edema. Subchondral cyst dissecting into the tibial on the medial side is identified.  Subtalar Joints/Sinus Tarsi: Fat within the sinus tarsi is obliterated suggestive of sinus tarsi syndrome. Mild degenerative disease of the posterior facet of  the subtalar joint is also identified.  Bones: Severe midfoot osteoarthritis is present appearing worst at the talonavicular and second tarsometatarsal joints.  IMPRESSION: Dominant finding is severe tibiotalar osteoarthritis. Advanced degenerative disease is also seen in the midfoot, worst at the second tarsometatarsal  and talonavicular joints.  Findings consistent with complete tear of the extensor hallucis longus tendon.  Chronic, complete tears of the anterior talofibular and calcaneofibular ligaments.  Findings compatible with sinus tarsi syndrome.   Electronically Signed   By: Inge Rise M.D.   On: 08/04/2013 09:29   Physicians involved in your care Any changes since last visit?  no   Family History  Problem Relation Age of Onset  . Arthritis Mother   . Arthritis Father   . Colon cancer Father   . Diabetes Sister   . Arthritis Sister   . Diabetes Maternal Grandmother   . Arthritis Maternal Grandmother   . Diabetes Maternal Grandfather   . Arthritis Maternal Grandfather    Social History   Social History  . Marital status: Widowed    Spouse name: N/A  . Number of children: 5  . Years of education: PhD   Occupational History  . Retired    Social History Main Topics  . Smoking status: Former Smoker    Packs/day: 0.12    Years: 2.00    Types: Cigarettes  . Smokeless tobacco: Never Used     Comment: 2012/08/16 "quit 30 years ago"  . Alcohol use No     Comment: August 16, 2012 "used to be a beeralcoholic"; stopped ~ 30 yr ago"  . Drug use: No  . Sexual activity: No   Other Topics Concern  . None   Social History Narrative   Lives at home with her mother and caregiver.   Occasional use of caffeine.   Right-handed.   Past Surgical History:  Procedure Laterality Date  . ABDOMINAL HYSTERECTOMY  1990's  . GANGLION CYST EXCISION Right 1980's   "wrist" (08-16-2012)  . JOINT REPLACEMENT    . KNEE LIGAMENT RECONSTRUCTION Right 1980's  .  TONSILLECTOMY  ~ 1954  . TOTAL HIP ARTHROPLASTY Left 08/16/2012  . TOTAL HIP ARTHROPLASTY Left 16-Aug-2012   Procedure: TOTAL HIP ARTHROPLASTY;  Surgeon: Garald Balding, MD;  Location: [place];  Service: Orthopedics;  Laterality: Left;  Left Total Hip Arthroplasty  . TOTAL HIP ARTHROPLASTY Left 08/28/2012   Procedure: Irrigation and Debridement hip ;  Surgeon: Mcarthur Rossetti, MD;  Location: [place];  Service: Orthopedics;  Laterality: Left;  . TOTAL KNEE ARTHROPLASTY Left 2001  . TOTAL KNEE ARTHROPLASTY Right 2004   Past Medical History:  Diagnosis Date  . Anemia   . Arthritis    "plenty" (2012-08-16)  . Asthma   . Chronic lower back pain    "they say I need a whole new spinal column" (August 16, 2012)  . COPD (chronic obstructive pulmonary disease) ([place])   . Degenerative arthritis    "all over" (08/16/12)  . GERD (gastroesophageal reflux disease)   . Gout 05/05/2013  . Hypertension   . Migraines    "used to; totally stopped when I quit drinking 30 yr ago" (08-16-12)  . Myalgia and myositis   . Myocardial infarction 1992   16-Aug-2012 "mild MI when son died "  . Obesity   . Osteoporosis    "all over" (08-16-2012)  . Shortness of breath    when stressed.  . Sleep apnea    "never did fix my machine; haven't used one for 5 years; I've lost 116# since then; no problems now" (16-Aug-2012)  . Type II diabetes mellitus ([place])    "borderline; don't test; take Metformin" (Aug 16, 2012)   BP 114/78   Pulse 78   SpO2 99%   Opioid Risk Score:  Fall Risk Score:  `1  Depression screen PHQ 2/9  Depression screen [facility] 2/9 06/09/2016 05/01/2016  Decreased Interest 3 0  Down, Depressed, Hopeless 0 0  PHQ - 2 Score 3 0  Altered sleeping 2 -  Tired, decreased energy 3 -  Change in appetite 3 -  Feeling bad or failure about yourself  0 -  Trouble concentrating 0 -  Moving slowly or fidgety/restless 0 -  Suicidal thoughts 0 -  PHQ-9 Score 11 -  Difficult doing work/chores Somewhat difficult -    Review  of Systems  Constitutional: Positive for unexpected weight change.  HENT: Negative.   Eyes: Negative.   Respiratory: Negative.   Cardiovascular: Negative.   Gastrointestinal: Negative.   Endocrine: Negative.   Genitourinary: Positive for dysuria.  Musculoskeletal: Positive for joint swelling.  Skin: Negative.   Allergic/Immunologic: Negative.   Neurological: Negative.   Hematological: Negative.   Psychiatric/Behavioral: Negative.   All other systems reviewed and are negative.      Objective:   Physical Exam  Constitutional: She is oriented to person, place, and time. She appears well-developed and well-nourished.  HENT:  Head: Normocephalic and atraumatic.  Eyes: Conjunctivae and EOM are normal. Pupils are equal, round, and reactive to light.  Neck: Normal range of motion.  Cardiovascular: Normal rate, regular rhythm and normal heart sounds.  Exam reveals no friction rub.   No murmur heard. Pulmonary/Chest: Effort normal and breath sounds normal. No respiratory distress.  Abdominal: Soft. She exhibits no distension. There is no tenderness.  Neurological: She is alert and oriented to person, place, and time.  Motor strength is 5/5 bilateral biceps, triceps, grip, 3 minus bilateral deltoids due to range of motion. 4 minus bilateral hip flexion, knee extensor, 3 minus. Right ankle dorsiflexion due to pain and limited range of motion 4 minus. Left ankle dorsiflexion. Sensation  Psychiatric: She has a normal mood and affect. Her behavior is normal. Judgment and thought content normal.  Nursing note and vitals reviewed.   14/18 Fibromyalgia tender points  Right tibio talar joint swelling Tender around talonavicular joint Bilateral shoulder with 90deg abd and flex Sensation reduced bilateral great toes and small toes as well as left medial ankle, intact right medial ankle, intact sensation to pinprick bilateral upper limbs       Assessment & Plan:  1. Fibromyalgia syndrome.  She does have multifocal widespread body pain. Would recommend trial of Lyrica 50 mg twice a day slowly titrating upward. Recommend aquatic exercise given her multiple orthopedic issues. This would likely be best tolerated  2. Severe osteoarthritis, right ankle. Would recommend right ankle injection under ultrasound guidance  Discontinue hydrocodone, she does not like the sedating side effects, trial tramadol 50 g twice a day  3. Lumbar spondylosis, would benefit from lumbar medial branch blocks, would schedule after the ankle injection  4. Cervical spinal stenosis. No evidence of myelopathy or radiculopathy  5. Lumbar spinal stenosis. No evidence of myelopathy or radiculopathy

## 2016-06-09 NOTE — Patient Instructions (Addendum)
Stop gabapentin, take lyrica 50mg  twice a day instead  Stop hydrocone, take tramadol 50mg  twice a day as needed instead  I would recommend aquatic exercise  Will do right ankle injection next visit, then do spinal injection the following month

## 2016-06-12 ENCOUNTER — Ambulatory Visit (INDEPENDENT_AMBULATORY_CARE_PROVIDER_SITE_OTHER)
Admission: RE | Admit: 2016-06-12 | Discharge: 2016-06-12 | Disposition: A | Discharge status: Home / Self Care | Payer: Medicare Other | Source: Ambulatory Visit

## 2016-06-12 DIAGNOSIS — Z780 Asymptomatic menopausal state: Secondary | ICD-10-CM | POA: Diagnosis not present

## 2016-06-17 ENCOUNTER — Telehealth: Payer: Self-pay | Admitting: Nurse Practitioner

## 2016-06-24 ENCOUNTER — Encounter: Payer: Self-pay | Admitting: Nurse Practitioner

## 2016-06-30 ENCOUNTER — Ambulatory Visit (HOSPITAL_COMMUNITY)
Admission: EM | Admit: 2016-06-30 | Discharge: 2016-06-30 | Disposition: A | Discharge status: Home / Self Care | Payer: Medicare Other | Attending: Internal Medicine | Admitting: Internal Medicine

## 2016-06-30 ENCOUNTER — Encounter (HOSPITAL_COMMUNITY): Payer: Self-pay | Admitting: Emergency Medicine

## 2016-06-30 DIAGNOSIS — M7918 Myalgia, other site: Secondary | ICD-10-CM

## 2016-06-30 DIAGNOSIS — R0789 Other chest pain: Secondary | ICD-10-CM | POA: Diagnosis not present

## 2016-06-30 DIAGNOSIS — M791 Myalgia: Secondary | ICD-10-CM

## 2016-06-30 MED ORDER — HYDROCODONE-ACETAMINOPHEN 5-325 MG PO TABS: 1.0000 | ORAL_TABLET | ORAL | 0 refills | Status: DC | PRN

## 2016-06-30 NOTE — Discharge Instructions (Signed)
Tenderness to the musculature of the right lateral hip and pelvis as well as the right ribs indicate musculoskeletal pain. Lungs are clear, no shortness of breath or cough. We will treat with pain medicine but cannot treat with anti-inflammatory medicines due to kidney problems. Follow-up with your primary care doctor in the next day or 2. This medicine can cause problems with walking and loss of balance as well as drowsiness.

## 2016-06-30 NOTE — ED Provider Notes (Signed)
CSN: [id number]     Arrival date & time 06/30/16  1904 History   First MD Initiated Contact with Patient 06/30/16 2040     Chief Complaint  Patient presents with  . Flank Pain   (Consider location/radiation/quality/duration/timing/severity/associated sxs/prior Treatment) 71 year old morbidly obese female complaining of pain to the right lateral chest and right iliac crests. Pain is worse with movement and bending, twisting. Started this afternoon insidiously while sitting in a chair. Denies any known trauma. She does state that she fell about 2 weeks ago and had an injury to her thigh but the pain today is completely different from that. She has no shortness of breath or cough or chest pain. Medical history includes total hip arthroplasty, chronic lower back pain, degenerative arthritis, COPD, chronic arthritis, degenerative arthritis, myalgia and myositis.      Past Medical History:  Diagnosis Date  . Anemia   . Arthritis    "plenty" (17-Aug-2012)  . Asthma   . Chronic lower back pain    "they say I need a whole new spinal column" (2012-08-17)  . COPD (chronic obstructive pulmonary disease) ([place])   . Degenerative arthritis    "all over" (08-17-2012)  . GERD (gastroesophageal reflux disease)   . Gout 05/05/2013  . Hypertension   . Migraines    "used to; totally stopped when I quit drinking 30 yr ago" (17-Aug-2012)  . Myalgia and myositis   . Myocardial infarction [facility]) 1992   2012/08/17 "mild MI when son died "  . Obesity   . Osteoporosis    "all over" (08/17/12)  . Shortness of breath    when stressed.  . Sleep apnea    "never did fix my machine; haven't used one for 5 years; I've lost 116# since then; no problems now" (17-Aug-2012)  . Type II diabetes mellitus ([place])    "borderline; don't test; take Metformin" (2012/08/17)   Past Surgical History:  Procedure Laterality Date  . ABDOMINAL HYSTERECTOMY  1990's  . GANGLION CYST EXCISION Right 1980's   "wrist" (17-Aug-2012)  . JOINT  REPLACEMENT    . KNEE LIGAMENT RECONSTRUCTION Right 1980's  . TONSILLECTOMY  ~ 1954  . TOTAL HIP ARTHROPLASTY Left 2012/08/17  . TOTAL HIP ARTHROPLASTY Left 2012-08-17   Procedure: TOTAL HIP ARTHROPLASTY;  Surgeon: Garald Balding, MD;  Location: [place];  Service: Orthopedics;  Laterality: Left;  Left Total Hip Arthroplasty  . TOTAL HIP ARTHROPLASTY Left 08/28/2012   Procedure: Irrigation and Debridement hip ;  Surgeon: Mcarthur Rossetti, MD;  Location: [place];  Service: Orthopedics;  Laterality: Left;  . TOTAL KNEE ARTHROPLASTY Left 2001  . TOTAL KNEE ARTHROPLASTY Right 2004   Family History  Problem Relation Age of Onset  . Arthritis Mother   . Arthritis Father   . Colon cancer Father   . Diabetes Sister   . Arthritis Sister   . Diabetes Maternal Grandmother   . Arthritis Maternal Grandmother   . Diabetes Maternal Grandfather   . Arthritis Maternal Grandfather    Social History  Substance Use Topics  . Smoking status: Former Smoker    Packs/day: 0.12    Years: 2.00    Types: Cigarettes  . Smokeless tobacco: Never Used     Comment: 08-17-2012 "quit 30 years ago"  . Alcohol use No     Comment: 2012/08/17 "used to be a beeralcoholic"; stopped ~ 30 yr ago"   OB History    No data available     Review of Systems  Constitutional:  Negative for activity change, chills and fever.  HENT: Negative.   Respiratory: Negative.   Cardiovascular: Negative.   Musculoskeletal: Negative for joint swelling.       As per HPI  Skin: Negative for color change, pallor and rash.  Neurological: Negative.   All other systems reviewed and are negative.   Allergies  Cymbalta [duloxetine hcl]; Influenza vaccines; Penicillins; Sulfa antibiotics; and Theophyllines  Home Medications   Prior to Admission medications   Medication Sig Start Date End Date Taking? Authorizing Provider  albuterol (PROVENTIL HFA;VENTOLIN HFA) 108 (90 BASE) MCG/ACT inhaler Inhale 2 puffs into the lungs every 6 (six)  hours as needed for wheezing.    Historical Provider, MD  allopurinol (ZYLOPRIM) 100 MG tablet Take 1 tablet (100 mg total) by mouth 2 (two) times daily. 06/04/16   Flossie Buffy, NP  aspirin EC 81 MG tablet Take 81 mg by mouth daily.    Historical Provider, MD  atorvastatin (LIPITOR) 10 MG tablet Take 1 tablet (10 mg total) by mouth daily. 06/04/16   Flossie Buffy, NP  cetirizine (ZYRTEC) 10 MG tablet Take 1 tablet (10 mg total) by mouth daily. 06/02/16   Flossie Buffy, NP  fluticasone (FLONASE) 50 MCG/ACT nasal spray Place 2 sprays into both nostrils daily. 06/02/16   Flossie Buffy, NP  HYDROcodone-acetaminophen (NORCO/VICODIN) 5-325 MG tablet Take 1 tablet by mouth every 4 (four) hours as needed. 06/30/16   Janne Napoleon, NP  lisinopril-hydrochlorothiazide (ZESTORETIC) 10-12.5 MG tablet Take 1 tablet by mouth daily. 04/03/16   Oswald Hillock, MD  nortriptyline (PAMELOR) 10 MG capsule Take 2 capsules (20 mg total) by mouth at bedtime. 11/13/15   Marcial Pacas, MD  pregabalin (LYRICA) 50 MG capsule Take 1 capsule (50 mg total) by mouth 2 (two) times daily. 06/09/16   Charlett Blake, MD  traMADol (ULTRAM) 50 MG tablet Take 1 tablet (50 mg total) by mouth 2 (two) times daily. 06/09/16   Charlett Blake, MD  Vitamin D, Ergocalciferol, (DRISDOL) 50000 units CAPS capsule Take 1 capsule (50,000 Units total) by mouth once a week. 06/04/16   Flossie Buffy, NP   Meds Ordered and Administered this Visit  Medications - No data to display  BP 137/65   Pulse 80   Temp 97.9 F (36.6 C) (Oral)   Resp 16   SpO2 100%  No data found.   Physical Exam  Constitutional: She is oriented to person, place, and time. She appears well-developed and well-nourished. No distress.  HENT:  Head: Normocephalic and atraumatic.  Eyes: EOM are normal.  Neck: Neck supple.  Cardiovascular: Normal rate, regular rhythm, normal heart sounds and intact distal pulses.   Pulmonary/Chest: Effort normal and breath sounds  normal. No respiratory distress. She has no wheezes. She has no rales. She exhibits tenderness.  Abdominal: Soft. There is no tenderness.  Musculoskeletal: She exhibits no edema.  Reproducible tenderness across the right iliac crest and lateral hip as well as the right lateral lower ribs and costal margin at the midaxillary line. Pain is exacerbated by having the patient leaned forward and rotate to the right and left. No abdominal tenderness.  Neurological: She is alert and oriented to person, place, and time. No cranial nerve deficit.  Skin: Skin is warm and dry.  Nursing note and vitals reviewed.   Urgent Care Course     Procedures (including critical care time)  Labs Review Labs Reviewed - No data to display  Imaging Review No  results found.   Visual Acuity Review  Right Eye Distance:   Left Eye Distance:   Bilateral Distance:    Right Eye Near:   Left Eye Near:    Bilateral Near:         MDM   1. Musculoskeletal pain   2. Chest wall pain    Tenderness to the musculature of the right lateral hip and pelvis as well as the right ribs indicate musculoskeletal pain. Lungs are clear, no shortness of breath or cough. We will treat with pain medicine but cannot treat with anti-inflammatory medicines due to kidney problems. Follow-up with your primary care doctor in the next day or 2. This medicine can cause problems with walking and loss of balance as well as drowsiness. Meds ordered this encounter  Medications  . HYDROcodone-acetaminophen (NORCO/VICODIN) 5-325 MG tablet    Sig: Take 1 tablet by mouth every 4 (four) hours as needed.    Dispense:  15 tablet    Refill:  0    Order Specific Question:   Supervising Provider    Answer:   Sherlene Shams [[id number]]  No NSAIDs prescribed due to low GFR and mildly elevated creatinine level.     Janne Napoleon, NP 06/30/16 2104

## 2016-06-30 NOTE — ED Triage Notes (Addendum)
The patient presented to the [facility] with a complaint of right side pain that started this afternoon. The patient reported that she did fall on that side 2 weeks ago.

## 2016-07-03 ENCOUNTER — Encounter: Payer: Self-pay | Admitting: Nurse Practitioner

## 2016-07-08 ENCOUNTER — Encounter: Payer: Self-pay | Admitting: Physical Medicine & Rehabilitation

## 2016-07-08 ENCOUNTER — Ambulatory Visit (HOSPITAL_BASED_OUTPATIENT_CLINIC_OR_DEPARTMENT_OTHER): Payer: Medicare Other | Admitting: Physical Medicine & Rehabilitation

## 2016-07-08 ENCOUNTER — Encounter: Payer: Medicare Other | Attending: Physical Medicine & Rehabilitation

## 2016-07-08 VITALS — BP 111/76 | HR 74 | Resp 16

## 2016-07-08 DIAGNOSIS — Z833 Family history of diabetes mellitus: Secondary | ICD-10-CM | POA: Insufficient documentation

## 2016-07-08 DIAGNOSIS — I252 Old myocardial infarction: Secondary | ICD-10-CM | POA: Diagnosis not present

## 2016-07-08 DIAGNOSIS — M19171 Post-traumatic osteoarthritis, right ankle and foot: Secondary | ICD-10-CM | POA: Diagnosis not present

## 2016-07-08 DIAGNOSIS — Z96642 Presence of left artificial hip joint: Secondary | ICD-10-CM | POA: Insufficient documentation

## 2016-07-08 DIAGNOSIS — Z6841 Body Mass Index (BMI) 40.0 and over, adult: Secondary | ICD-10-CM | POA: Diagnosis not present

## 2016-07-08 DIAGNOSIS — K219 Gastro-esophageal reflux disease without esophagitis: Secondary | ICD-10-CM | POA: Diagnosis not present

## 2016-07-08 DIAGNOSIS — M19071 Primary osteoarthritis, right ankle and foot: Secondary | ICD-10-CM | POA: Insufficient documentation

## 2016-07-08 DIAGNOSIS — Z96653 Presence of artificial knee joint, bilateral: Secondary | ICD-10-CM | POA: Diagnosis not present

## 2016-07-08 DIAGNOSIS — E119 Type 2 diabetes mellitus without complications: Secondary | ICD-10-CM | POA: Insufficient documentation

## 2016-07-08 DIAGNOSIS — M48061 Spinal stenosis, lumbar region without neurogenic claudication: Secondary | ICD-10-CM | POA: Diagnosis not present

## 2016-07-08 DIAGNOSIS — M797 Fibromyalgia: Secondary | ICD-10-CM | POA: Insufficient documentation

## 2016-07-08 DIAGNOSIS — M810 Age-related osteoporosis without current pathological fracture: Secondary | ICD-10-CM | POA: Diagnosis not present

## 2016-07-08 DIAGNOSIS — J449 Chronic obstructive pulmonary disease, unspecified: Secondary | ICD-10-CM | POA: Insufficient documentation

## 2016-07-08 DIAGNOSIS — M545 Low back pain: Secondary | ICD-10-CM | POA: Diagnosis present

## 2016-07-08 DIAGNOSIS — E669 Obesity, unspecified: Secondary | ICD-10-CM | POA: Diagnosis not present

## 2016-07-08 DIAGNOSIS — M109 Gout, unspecified: Secondary | ICD-10-CM | POA: Insufficient documentation

## 2016-07-08 DIAGNOSIS — Z8261 Family history of arthritis: Secondary | ICD-10-CM | POA: Insufficient documentation

## 2016-07-08 DIAGNOSIS — M4802 Spinal stenosis, cervical region: Secondary | ICD-10-CM | POA: Diagnosis not present

## 2016-07-08 DIAGNOSIS — I10 Essential (primary) hypertension: Secondary | ICD-10-CM | POA: Insufficient documentation

## 2016-07-08 DIAGNOSIS — Z87891 Personal history of nicotine dependence: Secondary | ICD-10-CM | POA: Diagnosis not present

## 2016-07-08 DIAGNOSIS — M47816 Spondylosis without myelopathy or radiculopathy, lumbar region: Secondary | ICD-10-CM | POA: Insufficient documentation

## 2016-07-08 MED ORDER — PREGABALIN 75 MG PO CAPS: 75.0000 mg | ORAL_CAPSULE | Freq: Two times a day (BID) | ORAL | 0 refills | Status: DC

## 2016-07-08 MED ORDER — TRAMADOL HCL 50 MG PO TABS: 50.0000 mg | ORAL_TABLET | Freq: Two times a day (BID) | ORAL | 0 refills | Status: DC

## 2016-07-08 NOTE — Progress Notes (Signed)
Subjective:    Patient ID: Sandra Brennan, female    DOB: May 29, 1945, 71 y.o.   MRN: [id number]  HPI  71 year old female with history of chronic neck pain, back pain and right ankle pain who was seen initially on 06/09/2016 Lyrica 50 mg twice a day was helping with widespread body pain  [place] [address] down steps at [employer] ago , went to urgent care 4/30 (~2wks post fall) for right side pain diagnosed with contusion Hadn't been to water aerobics since fall Pain Inventory Average Pain 8 Pain Right Now 5 My pain is sharp, burning, stabbing and aching  In the last 24 hours, has pain interfered with the following? General activity 6 Relation with others 3 Enjoyment of life 9 What TIME of day is your pain at its worst? daytime Sleep (in general) Fair  Pain is worse with: walking, sitting and standing Pain improves with: rest and medication Relief from Meds: 7  Mobility walk with assistance use a cane ability to climb steps?  yes do you drive?  no  Function retired  Neuro/Psych bladder control problems weakness numbness dizziness  Prior Studies Any changes since last visit?  no  Physicians involved in your care Any changes since last visit?  no   Family History  Problem Relation Age of Onset  . Arthritis Mother   . Arthritis Father   . Colon cancer Father   . Diabetes Sister   . Arthritis Sister   . Diabetes Maternal Grandmother   . Arthritis Maternal Grandmother   . Diabetes Maternal Grandfather   . Arthritis Maternal Grandfather    Social History   Social History  . Marital status: Widowed    Spouse name: N/A  . Number of children: 5  . Years of education: PhD   Occupational History  . Retired    Social History Main Topics  . Smoking status: Former Smoker    Packs/day: 0.12    Years: 2.00    Types: Cigarettes  . Smokeless tobacco: Never Used     Comment: 08/03/2012 "quit 30 years ago"  . Alcohol use No     Comment: 08/03/2012 "used to be a  beeralcoholic"; stopped ~ 30 yr ago"  . Drug use: No  . Sexual activity: No   Other Topics Concern  . Not on file   Social History Narrative   Lives at home with her mother and caregiver.   Occasional use of caffeine.   Right-handed.   Past Surgical History:  Procedure Laterality Date  . ABDOMINAL HYSTERECTOMY  1990's  . GANGLION CYST EXCISION Right 1980's   "wrist" (08/03/2012)  . JOINT REPLACEMENT    . KNEE LIGAMENT RECONSTRUCTION Right 1980's  . TONSILLECTOMY  ~ 1954  . TOTAL HIP ARTHROPLASTY Left 08/03/2012  . TOTAL HIP ARTHROPLASTY Left 08/03/2012   Procedure: TOTAL HIP ARTHROPLASTY;  Surgeon: Garald Balding, MD;  Location: [facility];  Service: Orthopedics;  Laterality: Left;  Left Total Hip Arthroplasty  . TOTAL HIP ARTHROPLASTY Left 08/28/2012   Procedure: Irrigation and Debridement hip ;  Surgeon: Mcarthur Rossetti, MD;  Location: [facility];  Service: Orthopedics;  Laterality: Left;  . TOTAL KNEE ARTHROPLASTY Left 2001  . TOTAL KNEE ARTHROPLASTY Right 2004   Past Medical History:  Diagnosis Date  . Anemia   . Arthritis    "plenty" (08/03/2012)  . Asthma   . Chronic lower back pain    "they say I need a whole new spinal column" (08/03/2012)  . COPD (  chronic obstructive pulmonary disease) ([place])   . Degenerative arthritis    "all over" (Aug 11, 2012)  . GERD (gastroesophageal reflux disease)   . Gout 05/05/2013  . Hypertension   . Migraines    "used to; totally stopped when I quit drinking 30 yr ago" (08-11-12)  . Myalgia and myositis   . Myocardial infarction [facility]) 1992   08/11/12 "mild MI when son died "  . Obesity   . Osteoporosis    "all over" (08-11-12)  . Shortness of breath    when stressed.  . Sleep apnea    "never did fix my machine; haven't used one for 5 years; I've lost 116# since then; no problems now" (2012-08-11)  . Type II diabetes mellitus ([place])    "borderline; don't test; take Metformin" (08/11/12)   There were no vitals taken for this visit.  Opioid  Risk Score:   Fall Risk Score:  `1  Depression screen PHQ 2/9  Depression screen [facility] 2/9 06/09/2016 05/01/2016  Decreased Interest 3 0  Down, Depressed, Hopeless 0 0  PHQ - 2 Score 3 0  Altered sleeping 2 -  Tired, decreased energy 3 -  Change in appetite 3 -  Feeling bad or failure about yourself  0 -  Trouble concentrating 0 -  Moving slowly or fidgety/restless 0 -  Suicidal thoughts 0 -  PHQ-9 Score 11 -  Difficult doing work/chores Somewhat difficult -    Review of Systems  Constitutional: Positive for appetite change and unexpected weight change.  HENT: Negative.   Eyes: Negative.   Respiratory: Negative.   Cardiovascular: Negative.   Gastrointestinal: Positive for diarrhea.  Endocrine: Negative.   Genitourinary: Positive for dysuria.  Musculoskeletal: Negative.   Skin: Positive for rash.  Allergic/Immunologic: Negative.   Neurological: Negative.   Hematological: Negative.   Psychiatric/Behavioral: Negative.        Objective:   Physical Exam  Constitutional: She is oriented to person, place, and time. She appears well-developed and well-nourished. No distress.  HENT:  Head: Normocephalic and atraumatic.  Eyes: Conjunctivae and EOM are normal. Pupils are equal, round, and reactive to light.  Neck:  Reduced range of motion  Pulmonary/Chest: No stridor.  Musculoskeletal:       Right shoulder: She exhibits decreased range of motion and pain.       Left shoulder: She exhibits decreased range of motion and pain.       Right ankle: She exhibits decreased range of motion and swelling. Tenderness. Lateral malleolus, medial malleolus and head of 5th metatarsal tenderness found. No proximal fibula tenderness found. Achilles tendon normal.  Bilaterally, limited shoulder abduction as well as external rotation accompanied by pain.  Right ankle has swelling along the joint line tenderness along the lateral medial malleolus as well as anteriorly along joint line.  Range of  motion is mildly reduced with dorsiflexion, plantarflexion as well as inversion eversion  Neurological: She is alert and oriented to person, place, and time. Coordination normal.  Skin: Skin is warm and dry. She is not diaphoretic. No erythema.  Psychiatric: She has a normal mood and affect.  Nursing note and vitals reviewed.   Right ankle joint line tenderness, no pain over Achilles,   Right great toe numb to light touch Negative straight leg raising  Motor strength is 4/5 bilateral deltoids by stress. Grip as well as hip flexors, knee extensors, 3 minus. Right ankle dorsiflexion related pain      Assessment & Plan:  1.  Widespread body  pain, most consistent with fibromyalgia syndrome, relief with Lyrica, although after fall, 50 mg dose did not seem as efficacious. Will increase to 75 mg twice a day 2. Chronic right ankle pain with tibiotalar osteoarthritis, schedule for ultrasound guided injection. Has had some exacerbation after fall, but no ecchymosis or increased swelling or instability noted. Therefore, we will not check repeat x-ray. 3. Chronic low back pain with lumbar spondylosis. This is not as painful as her right ankle, therefore, will hold off on spinal injections until #2 is addressed

## 2016-08-04 ENCOUNTER — Encounter: Payer: Medicare Other | Attending: Physical Medicine & Rehabilitation

## 2016-08-04 ENCOUNTER — Ambulatory Visit (HOSPITAL_BASED_OUTPATIENT_CLINIC_OR_DEPARTMENT_OTHER): Payer: Medicare Other | Admitting: Physical Medicine & Rehabilitation

## 2016-08-04 ENCOUNTER — Encounter: Payer: Self-pay | Admitting: Physical Medicine & Rehabilitation

## 2016-08-04 VITALS — BP 112/65 | HR 74

## 2016-08-04 DIAGNOSIS — I10 Essential (primary) hypertension: Secondary | ICD-10-CM | POA: Diagnosis not present

## 2016-08-04 DIAGNOSIS — Z96653 Presence of artificial knee joint, bilateral: Secondary | ICD-10-CM | POA: Diagnosis not present

## 2016-08-04 DIAGNOSIS — Z96642 Presence of left artificial hip joint: Secondary | ICD-10-CM | POA: Insufficient documentation

## 2016-08-04 DIAGNOSIS — M810 Age-related osteoporosis without current pathological fracture: Secondary | ICD-10-CM | POA: Diagnosis not present

## 2016-08-04 DIAGNOSIS — M797 Fibromyalgia: Secondary | ICD-10-CM | POA: Diagnosis not present

## 2016-08-04 DIAGNOSIS — M47816 Spondylosis without myelopathy or radiculopathy, lumbar region: Secondary | ICD-10-CM | POA: Diagnosis not present

## 2016-08-04 DIAGNOSIS — Z6841 Body Mass Index (BMI) 40.0 and over, adult: Secondary | ICD-10-CM | POA: Diagnosis not present

## 2016-08-04 DIAGNOSIS — E119 Type 2 diabetes mellitus without complications: Secondary | ICD-10-CM | POA: Insufficient documentation

## 2016-08-04 DIAGNOSIS — J449 Chronic obstructive pulmonary disease, unspecified: Secondary | ICD-10-CM | POA: Insufficient documentation

## 2016-08-04 DIAGNOSIS — M48061 Spinal stenosis, lumbar region without neurogenic claudication: Secondary | ICD-10-CM | POA: Diagnosis not present

## 2016-08-04 DIAGNOSIS — M545 Low back pain: Secondary | ICD-10-CM | POA: Diagnosis present

## 2016-08-04 DIAGNOSIS — E669 Obesity, unspecified: Secondary | ICD-10-CM | POA: Diagnosis not present

## 2016-08-04 DIAGNOSIS — M109 Gout, unspecified: Secondary | ICD-10-CM | POA: Diagnosis not present

## 2016-08-04 DIAGNOSIS — Z833 Family history of diabetes mellitus: Secondary | ICD-10-CM | POA: Diagnosis not present

## 2016-08-04 DIAGNOSIS — M4802 Spinal stenosis, cervical region: Secondary | ICD-10-CM | POA: Diagnosis not present

## 2016-08-04 DIAGNOSIS — Z8261 Family history of arthritis: Secondary | ICD-10-CM | POA: Insufficient documentation

## 2016-08-04 DIAGNOSIS — M19071 Primary osteoarthritis, right ankle and foot: Secondary | ICD-10-CM

## 2016-08-04 DIAGNOSIS — I252 Old myocardial infarction: Secondary | ICD-10-CM | POA: Insufficient documentation

## 2016-08-04 DIAGNOSIS — K219 Gastro-esophageal reflux disease without esophagitis: Secondary | ICD-10-CM | POA: Insufficient documentation

## 2016-08-04 DIAGNOSIS — Z87891 Personal history of nicotine dependence: Secondary | ICD-10-CM | POA: Diagnosis not present

## 2016-08-04 MED ORDER — PREGABALIN 75 MG PO CAPS: 75.0000 mg | ORAL_CAPSULE | Freq: Two times a day (BID) | ORAL | 0 refills | Status: DC

## 2016-08-04 NOTE — Patient Instructions (Signed)
Next visit is for lumbar injection will need a ride

## 2016-08-04 NOTE — Progress Notes (Signed)
Ultrasound guided injection of the right ankle. Patient was placed supine on exam table with knee flexed at 90 and foot placed flat on the front end of the table. The ankle was injured, 30 of plantar flexion. Linear transducer was utilized long axis to the foot. Area was prepped with Betadine. Using 25-gauge 1.5 inch needle. Lidocaine 1% was infiltrated into the skin and subcutaneous tissue. Under direct ultrasound guidance. This moved needle was removed and then a 22-gauge echo block needle was inserted into the tibiotalar joint. Then a solution containing 1 cc of 6 mg/cc Celestone and 3 cc 1% lidocaine were injected. Patient tolerated procedure well. Postprocedure instructions given

## 2016-08-13 ENCOUNTER — Other Ambulatory Visit: Payer: Self-pay | Admitting: Physical Medicine & Rehabilitation

## 2016-09-04 ENCOUNTER — Other Ambulatory Visit: Payer: Medicare Other

## 2016-09-04 ENCOUNTER — Encounter: Payer: Self-pay | Admitting: Nurse Practitioner

## 2016-09-04 ENCOUNTER — Ambulatory Visit (INDEPENDENT_AMBULATORY_CARE_PROVIDER_SITE_OTHER): Payer: Medicare Other | Admitting: Nurse Practitioner

## 2016-09-04 VITALS — BP 110/64 | HR 69 | Temp 98.7°F | Ht 60.0 in | Wt 218.0 lb

## 2016-09-04 DIAGNOSIS — M1A00X0 Idiopathic chronic gout, unspecified site, without tophus (tophi): Secondary | ICD-10-CM

## 2016-09-04 DIAGNOSIS — E1142 Type 2 diabetes mellitus with diabetic polyneuropathy: Secondary | ICD-10-CM

## 2016-09-04 DIAGNOSIS — R300 Dysuria: Secondary | ICD-10-CM

## 2016-09-04 DIAGNOSIS — M1A9XX0 Chronic gout, unspecified, without tophus (tophi): Secondary | ICD-10-CM

## 2016-09-04 DIAGNOSIS — I10 Essential (primary) hypertension: Secondary | ICD-10-CM | POA: Diagnosis not present

## 2016-09-04 DIAGNOSIS — E782 Mixed hyperlipidemia: Secondary | ICD-10-CM

## 2016-09-04 DIAGNOSIS — N3001 Acute cystitis with hematuria: Secondary | ICD-10-CM

## 2016-09-04 LAB — POCT URINALYSIS DIPSTICK
BILIRUBIN UA: NEGATIVE
Blood, UA: NEGATIVE
Glucose, UA: NEGATIVE
Ketones, UA: NEGATIVE
Nitrite, UA: NEGATIVE
PROTEIN UA: NEGATIVE
Spec Grav, UA: 1.015 (ref 1.010–1.025)
Urobilinogen, UA: 0.2 E.U./dL
pH, UA: 6.5 (ref 5.0–8.0)

## 2016-09-04 MED ORDER — NITROFURANTOIN MONOHYD MACRO 100 MG PO CAPS: 100.0000 mg | ORAL_CAPSULE | Freq: Two times a day (BID) | ORAL | 0 refills | Status: DC

## 2016-09-04 MED ORDER — ALLOPURINOL 100 MG PO TABS: 100.0000 mg | ORAL_TABLET | Freq: Two times a day (BID) | ORAL | 1 refills | Status: DC

## 2016-09-04 MED ORDER — LISINOPRIL-HYDROCHLOROTHIAZIDE 10-12.5 MG PO TABS: 1.0000 | ORAL_TABLET | Freq: Every day | ORAL | 1 refills | Status: DC

## 2016-09-04 MED ORDER — ATORVASTATIN CALCIUM 10 MG PO TABS: 10.0000 mg | ORAL_TABLET | Freq: Every day | ORAL | 1 refills | Status: DC

## 2016-09-04 NOTE — Progress Notes (Signed)
Subjective:  Patient ID: Sandra Brennan, female    DOB: 1945-09-19  Age: 71 y.o. MRN: [id number]  CC: Follow-up (follow up--seeing pain doctor/ painful when urinate go up to the hands--3 wks. )   Dysuria   This is a new problem. The current episode started 1 to 4 weeks ago. The problem occurs every urination. The problem has been unchanged. The quality of the pain is described as burning. There has been no fever. She is not sexually active. There is no history of pyelonephritis. Associated symptoms include frequency. Pertinent negatives include no chills, discharge, flank pain, hematuria, hesitancy, nausea, possible pregnancy, urgency or vomiting. She has tried nothing for the symptoms. Her past medical history is significant for urinary stasis. There is no history of catheterization, kidney stones, recurrent UTIs or a urological procedure.   HTN: stable  DM: controlled  Pain: Managed by pain clinic.  Outpatient Medications Prior to Visit  Medication Sig Dispense Refill  . albuterol (PROVENTIL HFA;VENTOLIN HFA) 108 (90 BASE) MCG/ACT inhaler Inhale 2 puffs into the lungs every 6 (six) hours as needed for wheezing.    Marland Kitchen aspirin EC 81 MG tablet Take 81 mg by mouth daily.    . cetirizine (ZYRTEC) 10 MG tablet Take 1 tablet (10 mg total) by mouth daily. 30 tablet 0  . fluticasone (FLONASE) 50 MCG/ACT nasal spray Place 2 sprays into both nostrils daily. 16 g 0  . LYRICA 75 MG capsule TAKE 1 CAPSULE BY MOUTH TWICE A DAY 60 capsule 0  . nortriptyline (PAMELOR) 10 MG capsule Take 2 capsules (20 mg total) by mouth at bedtime. 180 capsule 4  . traMADol (ULTRAM) 50 MG tablet TAKE 1 TABLET BY MOUTH TWICE A DAY 60 tablet 0  . Vitamin D, Ergocalciferol, (DRISDOL) 50000 units CAPS capsule Take 1 capsule (50,000 Units total) by mouth once a week. 12 capsule 1  . allopurinol (ZYLOPRIM) 100 MG tablet Take 1 tablet (100 mg total) by mouth 2 (two) times daily. 180 tablet 1  . atorvastatin (LIPITOR) 10 MG  tablet Take 1 tablet (10 mg total) by mouth daily. 30 tablet 2  . lisinopril-hydrochlorothiazide (ZESTORETIC) 10-12.5 MG tablet Take 1 tablet by mouth daily. 30 tablet 2   No facility-administered medications prior to visit.     ROS See HPI  Objective:  BP 110/64   Pulse 69   Temp 98.7 F (37.1 C)   Ht 5' (1.524 m)   Wt 218 lb (98.9 kg)   SpO2 98%   BMI 42.58 kg/m   BP Readings from Last 3 Encounters:  09/04/16 110/64  08/04/16 112/65  07/08/16 111/76    Wt Readings from Last 3 Encounters:  09/04/16 218 lb (98.9 kg)  06/02/16 206 lb (93.4 kg)  05/16/16 207 lb (93.9 kg)    Physical Exam  Constitutional: She is oriented to person, place, and time. No distress.  HENT:  Right Ear: External ear normal.  Left Ear: External ear normal.  Nose: Nose normal.  Mouth/Throat: No oropharyngeal exudate.  Eyes: No scleral icterus.  Neck: Normal range of motion. Neck supple.  Cardiovascular: Normal rate, regular rhythm and normal heart sounds.   Pulmonary/Chest: Effort normal and breath sounds normal. No respiratory distress.  Abdominal: Soft. She exhibits no distension.  Musculoskeletal: Normal range of motion. She exhibits no edema.  Lymphadenopathy:    She has no cervical adenopathy.  Neurological: She is alert and oriented to person, place, and time.  Skin: Skin is warm and dry.  Psychiatric: She has a normal mood and affect. Her behavior is normal.    Lab Results  Component Value Date   WBC 5.0 03/31/2016   HGB 9.1 (L) 03/31/2016   HCT 28.9 (L) 03/31/2016   PLT 160 03/31/2016   GLUCOSE 85 06/02/2016   CHOL 115 03/30/2016   TRIG 51 03/30/2016   HDL 68 03/30/2016   LDLCALC 37 03/30/2016   ALT 44 03/30/2016   AST 46 (H) 03/30/2016   NA 141 06/02/2016   K 4.0 06/02/2016   CL 104 06/02/2016   CREATININE 1.33 (H) 06/02/2016   BUN 21 06/02/2016   CO2 31 06/02/2016   INR 1.17 08/27/2012   HGBA1C 5.4 03/30/2016    No results found.  Assessment & Plan:    Lalena was seen today for follow-up.  Diagnoses and all orders for this visit:  Dysuria -     POCT urinalysis dipstick -     Urine Culture; Future -     nitrofurantoin, macrocrystal-monohydrate, (MACROBID) 100 MG capsule; Take 1 capsule (100 mg total) by mouth 2 (two) times daily.  Hypertension, unspecified type -     lisinopril-hydrochlorothiazide (ZESTORETIC) 10-12.5 MG tablet; Take 1 tablet by mouth daily.  Type 2 diabetes mellitus with diabetic polyneuropathy, without long-term current use of insulin (HCC)  Chronic gout involving toe of right foot without tophus, unspecified cause -     allopurinol (ZYLOPRIM) 100 MG tablet; Take 1 tablet (100 mg total) by mouth 2 (two) times daily.  Chronic gouty arthritis -     allopurinol (ZYLOPRIM) 100 MG tablet; Take 1 tablet (100 mg total) by mouth 2 (two) times daily.  Mixed hyperlipidemia -     atorvastatin (LIPITOR) 10 MG tablet; Take 1 tablet (10 mg total) by mouth daily at 6 PM.   I have changed Ms. Riddle's atorvastatin. I am also having her start on nitrofurantoin (macrocrystal-monohydrate). Additionally, I am having her maintain her albuterol, aspirin EC, nortriptyline, cetirizine, fluticasone, Vitamin D (Ergocalciferol), traMADol, LYRICA, lisinopril-hydrochlorothiazide, and allopurinol.  Meds ordered this encounter  Medications  . nitrofurantoin, macrocrystal-monohydrate, (MACROBID) 100 MG capsule    Sig: Take 1 capsule (100 mg total) by mouth 2 (two) times daily.    Dispense:  6 capsule    Refill:  0    Order Specific Question:   Supervising Provider    Answer:   Cassandria Anger [[id number]]  . atorvastatin (LIPITOR) 10 MG tablet    Sig: Take 1 tablet (10 mg total) by mouth daily at 6 PM.    Dispense:  90 tablet    Refill:  1    Dispense as insurance required.    Order Specific Question:   Supervising Provider    Answer:   Cassandria Anger [[id number]]  . lisinopril-hydrochlorothiazide (ZESTORETIC) 10-12.5 MG tablet     Sig: Take 1 tablet by mouth daily.    Dispense:  90 tablet    Refill:  1    Order Specific Question:   Supervising Provider    Answer:   Cassandria Anger [[id number]]  . allopurinol (ZYLOPRIM) 100 MG tablet    Sig: Take 1 tablet (100 mg total) by mouth 2 (two) times daily.    Dispense:  180 tablet    Refill:  1    Order Specific Question:   Supervising Provider    Answer:   Cassandria Anger [[id number]]    Follow-up: Return in about 6 months (around 03/07/2017) for DM and HTN, hyperlipidemia (fasting).  Wilfred Lacy,  NP

## 2016-09-06 ENCOUNTER — Encounter: Payer: Self-pay | Admitting: Neurology

## 2016-09-07 LAB — URINE CULTURE

## 2016-09-08 MED ORDER — CIPROFLOXACIN HCL 500 MG PO TABS: 500.0000 mg | ORAL_TABLET | Freq: Two times a day (BID) | ORAL | 0 refills | Status: DC

## 2016-09-09 ENCOUNTER — Ambulatory Visit (INDEPENDENT_AMBULATORY_CARE_PROVIDER_SITE_OTHER): Payer: Medicare Other | Admitting: Neurology

## 2016-09-09 ENCOUNTER — Encounter: Payer: Self-pay | Admitting: Neurology

## 2016-09-09 VITALS — BP 162/85 | HR 85 | Ht 60.0 in | Wt 217.0 lb

## 2016-09-09 DIAGNOSIS — Z9989 Dependence on other enabling machines and devices: Secondary | ICD-10-CM

## 2016-09-09 DIAGNOSIS — G4733 Obstructive sleep apnea (adult) (pediatric): Secondary | ICD-10-CM

## 2016-09-09 NOTE — Patient Instructions (Addendum)

## 2016-09-09 NOTE — Progress Notes (Signed)
Subjective:    Patient ID: Sandra Brennan is a 71 y.o. female.  HPI     Interim history:   Sandra Brennan is a 71 year old right-handed woman with an underlying medical history of hypertension, asthma, shortness of breath, coronary artery disease, status post MI, reflux disease, type 2 diabetes, anemia, migraine headaches, arthritis, osteoporosis, gout, COPD, obesity, lumbar spinal stenosis, status post left hip replacement surgery 2 years ago, status post left knee replacement in the 1980s and status post right total knee replacement in 2001, who presents for follow-up consultation of her obstructive sleep apnea, established on CPAP therapy. The patient is unaccompanied today. I last saw her on 09/10/2015, at which time she reported doing okay, she was compliant with treatment. She continued to take care of her elderly mom and sadly lost her brother in June 2017. She had lost a few pounds. She was advised to follow-up in one year.  Today, 09/09/2016 (all dictated new, as well as above notes, some dictation done in note pad or Word, outside of chart, may appear as copied):  I reviewed her CPAP compliance data from 08/08/2016 through 09/06/2016, which is a total of 30 days, during which time she used her CPAP every night with percent used days greater than 4 hours at 100%, indicating superb compliance with an average usage of 7 hours and 30 minutes, residual AHI low at 0.3 per hour, leak on the high side with the 95th percentile at 23.3 L/m on a pressure of 6 cm. She reports doing okay, sleeping well. Mother lives with her, age 59. Compared to 09/10/15 she is 20 lb less, but had lost 30 lb.  Fell in 4/18. Has been on Lyrica and tramadol for pain, followed by pain management.  The patient's allergies, current medications, family history, past medical history, past social history, past surgical history and problem list were reviewed and updated as appropriate.   Previously (copied from previous notes for  reference):   I saw her on 03/07/2015, after her recent sleep studies, at which time she reported doing better with CPAP and tolerating it. She felt slightly better, still had some issues with recurrent headaches. She was on nortriptyline 2 pills each night per Dr. Krista Blue. We reviewed her sleep study results and her compliance data at the time. She had a baseline sleep study in September 2016, followed by a CPAP titration study in October 2016.   I reviewed her CPAP compliance data from 08/06/2015 through 09/04/2015 which is a total of 30 days during which time she used her machine 29 days with percent used days greater than 4 hours at 93%, indicating excellent compliance with an average usage for all days of 7 hours and 9 minutes, residual AHI low at 0.6 per hour, leak acceptable with the 95th percentile at 12.2 L/m on a pressure of 6 cm with EPR of 1.    I first met her on 10/24/2014 at the request of Dr. Krista Blue, at which time the patient reported a prior diagnosis of OSA but she was no longer on CPAP therapy. She reported snoring, nonrestorative sleep and excessive daytime somnolence. I invited her back for sleep study. She had a baseline sleep study, followed by a CPAP titration study. I went over her test results with her in detail today. Her baseline sleep study from 11/27/2014 showed a sleep efficiency of 94.8% with a normal sleep latency of 12.5 minutes and wake after sleep onset of 7.5 minutes. She had an increased percentage of  stage II sleep, absence of slow-wave sleep and REM sleep at 19.2% with a normal REM latency. She had no significant PLMS, EKG or EEG changes. Mild snoring was noted. She slept mostly on her back. Total AHI was 10.2 per hour, rising to 42.5 per hour during REM sleep. Average oxygen saturation was 96%, nadir was 71% during REM sleep. Time below 90% saturation was 12 minutes and 25 seconds. Based on her sleep-related complaints and her test results in particular significant  desaturations in REM sleep I invited her back for a full night CPAP titration study. She had this on 12/14/2014. Sleep efficiency was 85.3% with a prolonged sleep latency of 43.5 minutes and wake after sleep onset of 26.5 minutes with mild sleep fragmentation noted. She had a normal percentage of light stage sleep, an increased percentage of slow-wave sleep at 33.1% and a mildly decreased percentage of REM sleep at 12% with a normal REM latency. She had no significant PLMS, EKG or EEG changes. Baseline oxygen saturation was 98%, nadir was 92%. CPAP was titrated from 5 cm to 6 cm. Her AHI was 0 per hour at that pressure. Supine REM sleep was achieved on the final pressure. Based on her test results I prescribed CPAP therapy for home use.   I reviewed her CPAP compliance data from 02/03/2015 through 03/04/2015 which is a total of 30 days during which time she used her machine 29 days with percent used days greater than 4 hours at 97%, indicating excellent compliance with an average usage of 6 hours and 23 minutes, residual AHI 1 per hour, leak acceptable with the 95th percentile at 12.1 L/m on a pressure of 6 cm with EPR of 1.   10/24/2014: She carries a prior diagnosis of obstructive sleep apnea. She is no longer on CPAP treatment. I reviewed your office note from 08/16/2014. For headaches you started her on nortriptyline. Prior sleep test results are not available for my review. She still snores. She does not wake up rested. Her Epworth sleepiness score is 11 out of 24, fatigue score is 48 out of 63 today. She had a difficult time tolerating CPAP in the past. She reports that she was much heavier (330 lb) at the time of her original sleep apnea diagnosis. She had a sleep study several years ago and has not used CPAP in about 6 years. She had CPAP with oxygen in the past. She was able to lose weight. She was down to 190 pounds but unfortunately gained weight back. She is currently at 240 pounds. She takes care  of her 88-year-old mother. 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 She drinks iced tea, one cup a day and has cut back on her sodas quite a bit she states. She quit smoking years ago. She would be willing to be retested for sleep apnea and consider CPAP treatment again if the need arises. She denies any frank restless leg syndrome type symptoms. She's not sure if she twitches in her sleep. Bedtime is around 9 PM and rise time is around 8 to 8:30. Despite a fairly long sleep time she does not wake up rested. She has nocturia typically 2-3 times per night. She denies morning headaches. Migraines have improved since she was started on nortriptyline. She currently works 10 hours a week for the [employer]. She had an EMG and nerve conduction study in April and requested test results for those. I went over the results with her today.     His Past Medical History Is Significant For: Past Medical History:  Diagnosis Date  . Anemia   . Arthritis    "plenty" (08/30/2012)  . Asthma   . Chronic lower back pain    "they say I need a whole new spinal column" (08-30-2012)  . COPD (chronic obstructive pulmonary disease) ([place])   . Degenerative arthritis    "all over" (Aug 30, 2012)  . GERD (gastroesophageal reflux disease)   . Gout 05/05/2013  . Hypertension   . Migraines    "used to; totally stopped when I quit drinking 30 yr ago" (08-30-12)  . Myalgia and myositis   . Myocardial infarction [facility]) 1992   2012/08/30 "mild MI when son died "  . Obesity   . Osteoporosis    "all over" (08/30/12)  . Shortness of breath    when stressed.  . Sleep apnea    "never did fix my machine; haven't used one for 5 years; I've lost 116# since then; no problems now" (2012/08/30)  . Type II diabetes mellitus ([place])    "borderline; don't test; take Metformin" (08/30/12)    His Past Surgical History Is Significant For: Past Surgical History:  Procedure Laterality Date  . ABDOMINAL HYSTERECTOMY   1990's  . GANGLION CYST EXCISION Right 1980's   "wrist" (2012-08-30)  . JOINT REPLACEMENT    . KNEE LIGAMENT RECONSTRUCTION Right 1980's  . TONSILLECTOMY  ~ 1954  . TOTAL HIP ARTHROPLASTY Left Aug 30, 2012  . TOTAL HIP ARTHROPLASTY Left 08-30-2012   Procedure: TOTAL HIP ARTHROPLASTY;  Surgeon: Garald Balding, MD;  Location: [facility];  Service: Orthopedics;  Laterality: Left;  Left Total Hip Arthroplasty  . TOTAL HIP ARTHROPLASTY Left 08/28/2012   Procedure: Irrigation and Debridement hip ;  Surgeon: Mcarthur Rossetti, MD;  Location: [facility];  Service: Orthopedics;  Laterality: Left;  . TOTAL KNEE ARTHROPLASTY Left 2001  . TOTAL KNEE ARTHROPLASTY Right 2004    His Family History Is Significant For: Family History  Problem Relation Age of Onset  . Arthritis Mother   . Arthritis Father   . Colon cancer Father   . Diabetes Sister   . Arthritis Sister   . Diabetes Maternal Grandmother   . Arthritis Maternal Grandmother   . Diabetes Maternal Grandfather   . Arthritis Maternal Grandfather     His Social History Is Significant For: Social History   Social History  . Marital status: Widowed    Spouse name: N/A  . Number of children: 5  . Years of education: PhD   Occupational History  . Retired    Social History Main Topics  . Smoking status: Former Smoker    Packs/day: 0.12    Years: 2.00    Types: Cigarettes  . Smokeless tobacco: Never Used     Comment: 2012-08-30 "quit 30 years ago"  . Alcohol use No     Comment: 08/30/12 "used to be a beeralcoholic"; stopped ~ 30 yr ago"  . Drug use: No  . Sexual activity: No   Other Topics Concern  . None   Social History Narrative   Lives at home with her mother and caregiver.   Occasional use of caffeine.   Right-handed.    His Allergies Are:  Allergies  Allergen Reactions  . Cymbalta [Duloxetine Hcl] Anaphylaxis  . Influenza Vaccines Hives and Itching  . Penicillins Hives  . Sulfa Antibiotics Other (See Comments)     REACTION: unknown  . Theophyllines Hives  :   His Current Medications Are:  Outpatient Encounter Prescriptions as of 09/09/2016  Medication Sig  . albuterol (PROVENTIL HFA;VENTOLIN HFA) 108 (90 BASE) MCG/ACT inhaler Inhale 2 puffs into the lungs every 6 (six) hours as needed for wheezing.  . allopurinol (ZYLOPRIM) 100 MG tablet Take 1 tablet (100 mg total) by mouth 2 (two) times daily.  . aspirin EC 81 MG tablet Take 81 mg by mouth daily.  . atorvastatin (LIPITOR) 10 MG tablet Take 1 tablet (10 mg total) by mouth daily at 6 PM.  . cetirizine (ZYRTEC) 10 MG tablet Take 1 tablet (10 mg total) by mouth daily.  . ciprofloxacin (CIPRO) 500 MG tablet Take 1 tablet (500 mg total) by mouth 2 (two) times daily. With food  . fluticasone (FLONASE) 50 MCG/ACT nasal spray Place 2 sprays into both nostrils daily.  . lisinopril-hydrochlorothiazide (ZESTORETIC) 10-12.5 MG tablet Take 1 tablet by mouth daily.  . LYRICA 75 MG capsule TAKE 1 CAPSULE BY MOUTH TWICE A DAY  . nortriptyline (PAMELOR) 10 MG capsule Take 2 capsules (20 mg total) by mouth at bedtime.  . traMADol (ULTRAM) 50 MG tablet TAKE 1 TABLET BY MOUTH TWICE A DAY  . Vitamin D, Ergocalciferol, (DRISDOL) 50000 units CAPS capsule Take 1 capsule (50,000 Units total) by mouth once a week.   No facility-administered encounter medications on file as of 09/09/2016.   :  Review of Systems:  Out of a complete 14 point review of systems, all are reviewed and negative with the exception of these symptoms as listed below: Review of Systems  Neurological:       Pt presents today to discuss her cpap. Pt is wondering if the "So Clean" machine is worth the money.     Objective:  Neurological Exam  Physical Exam Physical Examination:   Vitals:   09/09/16 1409  BP: (!) 162/85  Pulse: 85   General Examination: The patient is a very pleasant 71 y.o. female in no acute distress. She appears well-developed and well-nourished and well groomed. Good  spirits.   HEENT: Normocephalic, atraumatic, pupils are equal, round and reactive to light and accommodation. Extraocular tracking is good without limitation to gaze excursion or nystagmus noted. Normal smooth pursuit is noted. Hearing is grossly intact. Face is symmetric with normal facial animation and normal facial sensation. Speech is clear with no dysarthria noted. There is no hypophonia. There is no lip, neck/head, jaw or voice tremor. Neck is supple with full range of passive and active motion. There are no carotid bruits on auscultation. Oropharynx exam reveals: mild mouth dryness, adequate dental hygiene and moderate airway crowding. Nasal inspection reveals mild nasal mucosal bogginess or redness and no septal deviation.   Chest: Clear to auscultation without wheezing, rhonchi or crackles noted.  Heart: S1+S2+0, regular and normal without murmurs, rubs or gallops noted.   Abdomen: Soft, non-tender and non-distended with normal bowel sounds appreciated on auscultation.  Extremities: There is 1-2+ pitting edema in the distal lower extremities bilaterally, puffy fingers.   Skin: Warm and dry without trophic changes noted.   Musculoskeletal: exam reveals: 2 large scars on the Right knee and one on the L, is not wearing an AFO today.   Neurologically:  Mental status: The patient is awake, alert and oriented in all 4 spheres. Her immediate and remote memory, attention, language skills and fund of knowledge are appropriate. There is no evidence of aphasia, agnosia, apraxia or anomia. Speech is clear with normal prosody and enunciation. Thought process is linear. Mood is normal and affect is   normal.  Cranial nerves II - XII are as described above under HEENT exam. In addition: shoulder shrug is normal with equal shoulder height noted. Motor exam: Normal bulk, strength and tone is noted. There is no drift, tremor or rebound. Romberg is not tested as she is not able to stand narrow based.  Reflexes are 1+ in the UEs and diminished in the LEs. Fine motor skills and coordination: intact.  Cerebellar testing: No dysmetria or intention tremor on finger to nose testing. Heel to shin is difficult, not tried.  Sensory exam: intact to light touch in the UEs and LEs.  Gait, station and balance: She stands with difficulty. No veering to one side is noted. No leaning to one side is noted. Posture is age-appropriate and stance is wide based. Gait is cautious, slight limp. She has no cane today. She is not able to do tandem walk.               Assessment and Plan:   In summary, Sandra Brennan is a very pleasant 71 year old female with an underlying complex medical history of hypertension, asthma, shortness of breath, coronary artery disease, status post MI, reflux disease, type 2 diabetes, anemia, migraine headaches, arthritis, osteoporosis, gout, COPD, obesity, neuropathy, lumbar spinal stenosis, status post left hip replacement surgery 2 years ago, status post left knee replacement in the 1980s and status post right total knee replacement in 2001, who presents for follow up consultation of her OSA, established on CPAP at 6 cm with excellent compliance and ongoing good results reported. She had a baseline sleep study in September 2016, followed by a CPAP titration study in October 2016. She is commended for her treatment adherence. Her physical exam is stable. She has lost weight and is commended for this. We reviewed her most recent compliance data. She continues to report good sleep consolidation, and sleep quality. She is doing well and I suggested a one-year checkup with one of our NPs. I answered all her questions today and she was in agreement. I spent 20 minutes in total face-to-face time with the patient, more than 50% of which was spent in counseling and coordination of care, reviewing test results, reviewing medication and discussing or reviewing the diagnosis of OSA, its prognosis and  treatment options. Pertinent laboratory and imaging test results that were available during this visit with the patient were reviewed by me and considered in my medical decision making (see chart for details).

## 2016-09-15 ENCOUNTER — Ambulatory Visit: Payer: Medicare Other | Admitting: Physical Medicine & Rehabilitation

## 2016-09-15 ENCOUNTER — Other Ambulatory Visit: Payer: Self-pay

## 2016-09-15 ENCOUNTER — Encounter: Payer: Medicare Other | Attending: Physical Medicine & Rehabilitation

## 2016-09-15 DIAGNOSIS — Z87891 Personal history of nicotine dependence: Secondary | ICD-10-CM | POA: Insufficient documentation

## 2016-09-15 DIAGNOSIS — Z96653 Presence of artificial knee joint, bilateral: Secondary | ICD-10-CM | POA: Insufficient documentation

## 2016-09-15 DIAGNOSIS — E119 Type 2 diabetes mellitus without complications: Secondary | ICD-10-CM | POA: Insufficient documentation

## 2016-09-15 DIAGNOSIS — M48061 Spinal stenosis, lumbar region without neurogenic claudication: Secondary | ICD-10-CM | POA: Insufficient documentation

## 2016-09-15 DIAGNOSIS — M19071 Primary osteoarthritis, right ankle and foot: Secondary | ICD-10-CM | POA: Insufficient documentation

## 2016-09-15 DIAGNOSIS — Z833 Family history of diabetes mellitus: Secondary | ICD-10-CM | POA: Insufficient documentation

## 2016-09-15 DIAGNOSIS — I252 Old myocardial infarction: Secondary | ICD-10-CM | POA: Insufficient documentation

## 2016-09-15 DIAGNOSIS — M109 Gout, unspecified: Secondary | ICD-10-CM | POA: Insufficient documentation

## 2016-09-15 DIAGNOSIS — M47816 Spondylosis without myelopathy or radiculopathy, lumbar region: Secondary | ICD-10-CM | POA: Insufficient documentation

## 2016-09-15 DIAGNOSIS — M810 Age-related osteoporosis without current pathological fracture: Secondary | ICD-10-CM | POA: Insufficient documentation

## 2016-09-15 DIAGNOSIS — Z96642 Presence of left artificial hip joint: Secondary | ICD-10-CM | POA: Insufficient documentation

## 2016-09-15 DIAGNOSIS — J449 Chronic obstructive pulmonary disease, unspecified: Secondary | ICD-10-CM | POA: Insufficient documentation

## 2016-09-15 DIAGNOSIS — M4802 Spinal stenosis, cervical region: Secondary | ICD-10-CM | POA: Insufficient documentation

## 2016-09-15 DIAGNOSIS — I10 Essential (primary) hypertension: Secondary | ICD-10-CM | POA: Insufficient documentation

## 2016-09-15 DIAGNOSIS — K219 Gastro-esophageal reflux disease without esophagitis: Secondary | ICD-10-CM | POA: Insufficient documentation

## 2016-09-15 DIAGNOSIS — E669 Obesity, unspecified: Secondary | ICD-10-CM | POA: Insufficient documentation

## 2016-09-15 DIAGNOSIS — M797 Fibromyalgia: Secondary | ICD-10-CM | POA: Insufficient documentation

## 2016-09-15 DIAGNOSIS — Z8261 Family history of arthritis: Secondary | ICD-10-CM | POA: Insufficient documentation

## 2016-09-15 DIAGNOSIS — Z6841 Body Mass Index (BMI) 40.0 and over, adult: Secondary | ICD-10-CM | POA: Insufficient documentation

## 2016-09-15 MED ORDER — PREGABALIN 75 MG PO CAPS: 75.0000 mg | ORAL_CAPSULE | Freq: Two times a day (BID) | ORAL | 2 refills | Status: DC

## 2016-09-15 MED ORDER — TRAMADOL HCL 50 MG PO TABS: 50.0000 mg | ORAL_TABLET | Freq: Two times a day (BID) | ORAL | 2 refills | Status: DC

## 2016-09-24 ENCOUNTER — Other Ambulatory Visit: Payer: Self-pay | Admitting: Physical Medicine & Rehabilitation

## 2016-10-06 ENCOUNTER — Encounter: Payer: Medicare Other | Attending: Physical Medicine & Rehabilitation

## 2016-10-06 ENCOUNTER — Ambulatory Visit (HOSPITAL_BASED_OUTPATIENT_CLINIC_OR_DEPARTMENT_OTHER): Payer: Medicare Other | Admitting: Physical Medicine & Rehabilitation

## 2016-10-06 ENCOUNTER — Encounter: Payer: Self-pay | Admitting: Physical Medicine & Rehabilitation

## 2016-10-06 VITALS — BP 132/74 | HR 77 | Resp 14

## 2016-10-06 DIAGNOSIS — M545 Low back pain: Secondary | ICD-10-CM | POA: Diagnosis present

## 2016-10-06 DIAGNOSIS — K219 Gastro-esophageal reflux disease without esophagitis: Secondary | ICD-10-CM | POA: Insufficient documentation

## 2016-10-06 DIAGNOSIS — E669 Obesity, unspecified: Secondary | ICD-10-CM | POA: Diagnosis not present

## 2016-10-06 DIAGNOSIS — M19071 Primary osteoarthritis, right ankle and foot: Secondary | ICD-10-CM | POA: Diagnosis not present

## 2016-10-06 DIAGNOSIS — Z87891 Personal history of nicotine dependence: Secondary | ICD-10-CM | POA: Insufficient documentation

## 2016-10-06 DIAGNOSIS — M810 Age-related osteoporosis without current pathological fracture: Secondary | ICD-10-CM | POA: Diagnosis not present

## 2016-10-06 DIAGNOSIS — M47816 Spondylosis without myelopathy or radiculopathy, lumbar region: Secondary | ICD-10-CM | POA: Diagnosis not present

## 2016-10-06 DIAGNOSIS — Z6841 Body Mass Index (BMI) 40.0 and over, adult: Secondary | ICD-10-CM | POA: Diagnosis not present

## 2016-10-06 DIAGNOSIS — I252 Old myocardial infarction: Secondary | ICD-10-CM | POA: Insufficient documentation

## 2016-10-06 DIAGNOSIS — M48061 Spinal stenosis, lumbar region without neurogenic claudication: Secondary | ICD-10-CM | POA: Insufficient documentation

## 2016-10-06 DIAGNOSIS — Z96653 Presence of artificial knee joint, bilateral: Secondary | ICD-10-CM | POA: Insufficient documentation

## 2016-10-06 DIAGNOSIS — E119 Type 2 diabetes mellitus without complications: Secondary | ICD-10-CM | POA: Diagnosis not present

## 2016-10-06 DIAGNOSIS — Z96642 Presence of left artificial hip joint: Secondary | ICD-10-CM | POA: Insufficient documentation

## 2016-10-06 DIAGNOSIS — I10 Essential (primary) hypertension: Secondary | ICD-10-CM | POA: Diagnosis not present

## 2016-10-06 DIAGNOSIS — Z8261 Family history of arthritis: Secondary | ICD-10-CM | POA: Diagnosis not present

## 2016-10-06 DIAGNOSIS — J449 Chronic obstructive pulmonary disease, unspecified: Secondary | ICD-10-CM | POA: Insufficient documentation

## 2016-10-06 DIAGNOSIS — M4802 Spinal stenosis, cervical region: Secondary | ICD-10-CM | POA: Insufficient documentation

## 2016-10-06 DIAGNOSIS — Z833 Family history of diabetes mellitus: Secondary | ICD-10-CM | POA: Insufficient documentation

## 2016-10-06 DIAGNOSIS — M797 Fibromyalgia: Secondary | ICD-10-CM | POA: Diagnosis not present

## 2016-10-06 DIAGNOSIS — M109 Gout, unspecified: Secondary | ICD-10-CM | POA: Insufficient documentation

## 2016-10-06 NOTE — Progress Notes (Signed)

## 2016-10-06 NOTE — Patient Instructions (Signed)

## 2016-10-06 NOTE — Progress Notes (Signed)
  PROCEDURE RECORD [facility]   Name: Sandra Brennan DOB:1945/12/08 MRN: [id number]  Date:10/06/2016  Physician: Alysia Penna, MD    Nurse/CMA: Bright, CMA  Allergies:  Allergies  Allergen Reactions  . Cymbalta [Duloxetine Hcl] Anaphylaxis  . Influenza Vaccines Hives and Itching  . Penicillins Hives  . Sulfa Antibiotics Other (See Comments)    REACTION: unknown  . Theophyllines Hives    Consent Signed: Yes.    Is patient diabetic? No.  CBG today NA  Pregnant: No. LMP: No LMP recorded. Patient has had a hysterectomy. (age 64-55)  Anticoagulants: no Anti-inflammatory: no Antibiotics: no  Procedure: bilateral medial branch block  Position: Prone   Start Time: 1013am End Time: 1025am Fluoro Time: 39s  RN/CMA Charon Smedberg, CMA Bright, CMA    Time 9:50am 1028am    BP 132/74 125/81    Pulse 77 77    Respirations 14 14    O2 Sat 95 96    S/S 6 6    Pain Level 7/10 2/10     D/C home with son, patient A & O X 3, D/C instructions reviewed, and sits independently.

## 2016-10-09 ENCOUNTER — Telehealth: Payer: Self-pay | Admitting: Nurse Practitioner

## 2016-10-09 ENCOUNTER — Other Ambulatory Visit: Payer: Self-pay | Admitting: Internal Medicine

## 2016-10-09 DIAGNOSIS — M1A00X0 Idiopathic chronic gout, unspecified site, without tophus (tophi): Secondary | ICD-10-CM

## 2016-10-09 MED ORDER — COLCHICINE 0.6 MG PO CAPS: 1.0000 | ORAL_CAPSULE | Freq: Two times a day (BID) | ORAL | 0 refills | Status: DC

## 2016-10-09 NOTE — Telephone Encounter (Signed)
Patient called requesting refill on colchicine.  States she is having a gout flare up.  Informed patient she would need an appointment.  Patient wanted me to see if Sandra Brennan would send medication in w/out appt.

## 2016-10-09 NOTE — Telephone Encounter (Signed)
Routing to dr Ronnald Ramp, I dont see this on patient's current med list---are you ok with ordering or do we need office visit, please advise, I will call patient back, thanks

## 2016-10-24 NOTE — Telephone Encounter (Signed)
This encounter was created in error - please disregard.

## 2016-11-05 ENCOUNTER — Other Ambulatory Visit: Payer: Self-pay | Admitting: Internal Medicine

## 2016-11-05 DIAGNOSIS — M1A00X0 Idiopathic chronic gout, unspecified site, without tophus (tophi): Secondary | ICD-10-CM

## 2016-11-06 NOTE — Telephone Encounter (Signed)
Please advise 

## 2016-11-08 ENCOUNTER — Other Ambulatory Visit: Payer: Self-pay | Admitting: Internal Medicine

## 2016-11-08 DIAGNOSIS — M1A00X0 Idiopathic chronic gout, unspecified site, without tophus (tophi): Secondary | ICD-10-CM

## 2016-11-10 ENCOUNTER — Encounter: Payer: Medicare Other | Attending: Physical Medicine & Rehabilitation

## 2016-11-10 ENCOUNTER — Ambulatory Visit (HOSPITAL_BASED_OUTPATIENT_CLINIC_OR_DEPARTMENT_OTHER): Payer: Medicare Other | Admitting: Physical Medicine & Rehabilitation

## 2016-11-10 ENCOUNTER — Encounter: Payer: Self-pay | Admitting: Physical Medicine & Rehabilitation

## 2016-11-10 VITALS — BP 118/80 | HR 78 | Resp 14

## 2016-11-10 DIAGNOSIS — E669 Obesity, unspecified: Secondary | ICD-10-CM | POA: Diagnosis not present

## 2016-11-10 DIAGNOSIS — I10 Essential (primary) hypertension: Secondary | ICD-10-CM | POA: Diagnosis not present

## 2016-11-10 DIAGNOSIS — Z87891 Personal history of nicotine dependence: Secondary | ICD-10-CM | POA: Insufficient documentation

## 2016-11-10 DIAGNOSIS — M810 Age-related osteoporosis without current pathological fracture: Secondary | ICD-10-CM | POA: Insufficient documentation

## 2016-11-10 DIAGNOSIS — M47816 Spondylosis without myelopathy or radiculopathy, lumbar region: Secondary | ICD-10-CM

## 2016-11-10 DIAGNOSIS — M797 Fibromyalgia: Secondary | ICD-10-CM | POA: Diagnosis not present

## 2016-11-10 DIAGNOSIS — M19071 Primary osteoarthritis, right ankle and foot: Secondary | ICD-10-CM | POA: Diagnosis not present

## 2016-11-10 DIAGNOSIS — M48061 Spinal stenosis, lumbar region without neurogenic claudication: Secondary | ICD-10-CM | POA: Insufficient documentation

## 2016-11-10 DIAGNOSIS — Z8261 Family history of arthritis: Secondary | ICD-10-CM | POA: Insufficient documentation

## 2016-11-10 DIAGNOSIS — Z833 Family history of diabetes mellitus: Secondary | ICD-10-CM | POA: Insufficient documentation

## 2016-11-10 DIAGNOSIS — Z6841 Body Mass Index (BMI) 40.0 and over, adult: Secondary | ICD-10-CM | POA: Diagnosis not present

## 2016-11-10 DIAGNOSIS — Z96642 Presence of left artificial hip joint: Secondary | ICD-10-CM | POA: Insufficient documentation

## 2016-11-10 DIAGNOSIS — E119 Type 2 diabetes mellitus without complications: Secondary | ICD-10-CM | POA: Diagnosis not present

## 2016-11-10 DIAGNOSIS — K219 Gastro-esophageal reflux disease without esophagitis: Secondary | ICD-10-CM | POA: Insufficient documentation

## 2016-11-10 DIAGNOSIS — M545 Low back pain: Secondary | ICD-10-CM | POA: Diagnosis present

## 2016-11-10 DIAGNOSIS — M109 Gout, unspecified: Secondary | ICD-10-CM | POA: Diagnosis not present

## 2016-11-10 DIAGNOSIS — J449 Chronic obstructive pulmonary disease, unspecified: Secondary | ICD-10-CM | POA: Insufficient documentation

## 2016-11-10 DIAGNOSIS — Z96653 Presence of artificial knee joint, bilateral: Secondary | ICD-10-CM | POA: Diagnosis not present

## 2016-11-10 DIAGNOSIS — I252 Old myocardial infarction: Secondary | ICD-10-CM | POA: Diagnosis not present

## 2016-11-10 DIAGNOSIS — M4802 Spinal stenosis, cervical region: Secondary | ICD-10-CM | POA: Insufficient documentation

## 2016-11-10 NOTE — Progress Notes (Signed)
  PROCEDURE RECORD [facility]   Name: Sandra Brennan DOB:1945/10/06 MRN: [id number]  Date:11/10/2016  Physician: Alysia Penna, MD    Nurse/CMA: Neveah Bang, CMA  Allergies:  Allergies  Allergen Reactions  . Cymbalta [Duloxetine Hcl] Anaphylaxis  . Influenza Vaccines Hives and Itching  . Penicillins Hives  . Sulfa Antibiotics Other (See Comments)    REACTION: unknown  . Theophyllines Hives    Consent Signed: Yes.    Is patient diabetic? No.  CBG today?   Pregnant: No. LMP: No LMP recorded. Patient has had a hysterectomy. (age 65-55)  Anticoagulants: no Anti-inflammatory: no Antibiotics: no  Procedure: bilateral medial branch block      Position: Prone Start Time: 9:30am  End Time: 9:43am  Fluoro Time: 45  RN/CMA Elijahjames Fuelling, CMA Renardo Cheatum, CMA    Time 9:15am 9:47am    BP 118/80 137/65    Pulse 78 73    Respirations 14 14    O2 Sat 95 97    S/S 6 6    Pain Level 6/10 6/10     D/C home with Adonis Huguenin (friend), patient A & O X 3, D/C instructions reviewed, and sits independently.

## 2016-11-10 NOTE — Progress Notes (Signed)

## 2016-11-10 NOTE — Patient Instructions (Signed)

## 2016-11-24 ENCOUNTER — Ambulatory Visit (INDEPENDENT_AMBULATORY_CARE_PROVIDER_SITE_OTHER): Payer: Medicare Other

## 2016-11-24 ENCOUNTER — Encounter (INDEPENDENT_AMBULATORY_CARE_PROVIDER_SITE_OTHER): Payer: Self-pay | Admitting: Orthopaedic Surgery

## 2016-11-24 ENCOUNTER — Ambulatory Visit (INDEPENDENT_AMBULATORY_CARE_PROVIDER_SITE_OTHER): Payer: Medicare Other | Admitting: Orthopaedic Surgery

## 2016-11-24 VITALS — BP 126/63 | HR 75 | Resp 16 | Ht 60.0 in | Wt 200.0 lb

## 2016-11-24 DIAGNOSIS — Z96653 Presence of artificial knee joint, bilateral: Secondary | ICD-10-CM | POA: Diagnosis not present

## 2016-11-24 NOTE — Progress Notes (Signed)
Office Visit Note   Patient: Sandra Brennan           Date of Birth: 03-06-1945           MRN: [id number] Visit Date: 11/24/2016              Requested by: Flossie Buffy, NP [address], [place] [address] PCP: Flossie Buffy, NP   Assessment & Plan: Visit Diagnoses:  1. Presence of both artificial knee joints   painful left total knee replacement without story of fever-ray. Could be referred pain from chronic back discomfort.chronic right ankle pain related to osteoarthritic changes in midfoot and tibiotalar joint  Plan: ankle-foot orthotic right lower extremity. Follow-up next 4-6 weeks.  Follow-Up Instructions: Return if symptoms worsen or fail to improve.   Orders:  Orders Placed This Encounter  Procedures  . XR KNEE 3 VIEW LEFT   No orders of the defined types were placed in this encounter.     Procedures: No procedures performed   Clinical Data: No additional findings.   Subjective: Chief Complaint  Patient presents with  . Left Knee - Pain, Edema    Sandra Brennan is a 71 y o that presents with Left knee pain (hx of TKA) , pt has fallen on multiple occasions in the ;ast 3 weeks. Knees give way. Ambulates with a cane. Hx of bilateral knee replacement and Left THA 2x.  . Right Ankle - Pain, Edema    Her RIght ankle also hurts and she has fallen because the R foot "stops". Also lumbar injections from Dr. Burnetta Sabin.  Sandra Brennan denies any recent history of injury or trauma although she's had several falls in the past. Complains of chronic right ankle and foot pain with a prior diagnosis of end-stage osteoarthritis of the right ankle and midfoot.prior history of left total knee replacement and left total hip replacement with chronic low back pain.  HPI  Review of Systems  Constitutional: Positive for fatigue. Negative for chills and fever.  Eyes: Negative for itching.  Respiratory: Negative for chest tightness and shortness of breath.     Cardiovascular: Positive for leg swelling. Negative for chest pain and palpitations.  Gastrointestinal: Negative for blood in stool, constipation and diarrhea.  Endocrine: Negative for polyuria.  Genitourinary: Negative for dysuria.  Musculoskeletal: Positive for back pain and neck pain. Negative for joint swelling and neck stiffness.  Allergic/Immunologic: Negative for immunocompromised state.  Neurological: Positive for headaches. Negative for dizziness and numbness.  Hematological: Does not bruise/bleed easily.  Psychiatric/Behavioral: The patient is not nervous/anxious.      Objective: Vital Signs: BP 126/63   Pulse 75   Resp 16   Ht 5' (1.524 m)   Wt 200 lb (90.7 kg)   BMI 39.06 kg/m   Physical Exam  Ortho Examleft knee was not hot red or swollen. Mild opening laterally to varus stress. No opening medially with a valgus stress. No obvious effusion. large knees. No significant medial lateral joint pain. No calf discomfort. No popliteal fullness.painless range of motion left hip. Straight leg raise negative bilaterally. Limited range of motion right ankle with only approximately 10 or 15 of dorsiflexion and plantarflexion from neutral. No subtalar motion. Skin intact.some altered sensibility possibly related to diabetic neuropathy.pes planus  Specialty Comments:  No specialty comments available.  Imaging: Xr Knee 3 View Left  Result Date: 11/24/2016 Films of the left knee were obtained in 3 projections standing. There is a previously inserted total  knee repent with polymethylmethacrylate. No evidence of complication. Alignment seems fine. No areas of lucency about the femur or the tibia. Patella tracks in the midline    PMFS History: Patient Active Problem List   Diagnosis Date Noted  . Post-traumatic osteoarthritis of right ankle 06/09/2016  . Spondylosis without myelopathy or radiculopathy, lumbar region 06/09/2016  . Cervical spinal stenosis 06/09/2016  . Spinal  stenosis, lumbar region, without neurogenic claudication 06/09/2016  . Chronic gouty arthritis 06/04/2016  . Vitamin D deficiency 06/02/2016  . Hyperlipidemia 06/02/2016  . Closed right ankle fracture 05/01/2016  . Colon polyps 05/01/2016  . Fibromyalgia syndrome 05/01/2016  . Syncope 03/30/2016  . Chest pain 03/30/2016  . Fall 03/30/2016  . Obesity 05/06/2013  . Acute nontraumatic kidney injury ([place]) 05/05/2013  . Acute posthemorrhagic anemia 09/06/2012  . Gout 09/05/2012  . Osteoarthritis of left hip 08/05/2012  . Type 2 diabetes mellitus with diabetic polyneuropathy, without long-term current use of insulin ([place]) 08/05/2012  . Hypertension 08/05/2012  . Asthma, chronic 08/05/2012  . Sleep apnea 08/05/2012   Past Medical History:  Diagnosis Date  . Anemia   . Arthritis    "plenty" (05-Aug-2012)  . Asthma   . Chronic lower back pain    "they say I need a whole new spinal column" (08/05/2012)  . COPD (chronic obstructive pulmonary disease) ([place])   . Degenerative arthritis    "all over" (08/05/12)  . GERD (gastroesophageal reflux disease)   . Gout 05/05/2013  . Hypertension   . Migraines    "used to; totally stopped when I quit drinking 30 yr ago" (August 05, 2012)  . Myalgia and myositis   . Myocardial infarction [facility]) 1992   08-05-2012 "mild MI when son died "  . Obesity   . Osteoporosis    "all over" (08-05-12)  . Shortness of breath    when stressed.  . Sleep apnea    "never did fix my machine; haven't used one for 5 years; I've lost 116# since then; no problems now" (08/05/12)  . Type II diabetes mellitus ([place])    "borderline; don't test; take Metformin" (08/05/2012)    Family History  Problem Relation Age of Onset  . Arthritis Mother   . Arthritis Father   . Colon cancer Father   . Diabetes Sister   . Arthritis Sister   . Diabetes Maternal Grandmother   . Arthritis Maternal Grandmother   . Diabetes Maternal Grandfather   . Arthritis Maternal Grandfather     Past  Surgical History:  Procedure Laterality Date  . ABDOMINAL HYSTERECTOMY  1990's  . GANGLION CYST EXCISION Right 1980's   "wrist" (2012-08-05)  . JOINT REPLACEMENT    . KNEE LIGAMENT RECONSTRUCTION Right 1980's  . TONSILLECTOMY  ~ 1954  . TOTAL HIP ARTHROPLASTY Left 05-Aug-2012  . TOTAL HIP ARTHROPLASTY Left Aug 05, 2012   Procedure: TOTAL HIP ARTHROPLASTY;  Surgeon: Garald Balding, MD;  Location: [facility];  Service: Orthopedics;  Laterality: Left;  Left Total Hip Arthroplasty  . TOTAL HIP ARTHROPLASTY Left 08/28/2012   Procedure: Irrigation and Debridement hip ;  Surgeon: Mcarthur Rossetti, MD;  Location: [facility];  Service: Orthopedics;  Laterality: Left;  . TOTAL KNEE ARTHROPLASTY Left 2001  . TOTAL KNEE ARTHROPLASTY Right 2004   Social History   Occupational History  . Retired    Social History Main Topics  . Smoking status: Former Smoker    Packs/day: 0.12    Years: 2.00    Types: Cigarettes  . Smokeless tobacco:  Never Used     Comment: 08/03/2012 "quit 30 years ago"  . Alcohol use No     Comment: 08/03/2012 "used to be a beeralcoholic"; stopped ~ 30 yr ago"  . Drug use: No  . Sexual activity: No

## 2016-12-01 ENCOUNTER — Other Ambulatory Visit: Payer: Self-pay | Admitting: Neurology

## 2016-12-08 ENCOUNTER — Encounter: Payer: Medicare Other | Attending: Physical Medicine & Rehabilitation

## 2016-12-08 ENCOUNTER — Encounter: Payer: Self-pay | Admitting: Physical Medicine & Rehabilitation

## 2016-12-08 ENCOUNTER — Ambulatory Visit (HOSPITAL_BASED_OUTPATIENT_CLINIC_OR_DEPARTMENT_OTHER): Payer: Medicare Other | Admitting: Physical Medicine & Rehabilitation

## 2016-12-08 VITALS — BP 113/75 | HR 75 | Resp 143

## 2016-12-08 DIAGNOSIS — M48061 Spinal stenosis, lumbar region without neurogenic claudication: Secondary | ICD-10-CM | POA: Insufficient documentation

## 2016-12-08 DIAGNOSIS — Z833 Family history of diabetes mellitus: Secondary | ICD-10-CM | POA: Insufficient documentation

## 2016-12-08 DIAGNOSIS — M47816 Spondylosis without myelopathy or radiculopathy, lumbar region: Secondary | ICD-10-CM | POA: Diagnosis not present

## 2016-12-08 DIAGNOSIS — Z96642 Presence of left artificial hip joint: Secondary | ICD-10-CM | POA: Diagnosis not present

## 2016-12-08 DIAGNOSIS — J449 Chronic obstructive pulmonary disease, unspecified: Secondary | ICD-10-CM | POA: Insufficient documentation

## 2016-12-08 DIAGNOSIS — M545 Low back pain: Secondary | ICD-10-CM | POA: Diagnosis present

## 2016-12-08 DIAGNOSIS — Z8261 Family history of arthritis: Secondary | ICD-10-CM | POA: Insufficient documentation

## 2016-12-08 DIAGNOSIS — M19071 Primary osteoarthritis, right ankle and foot: Secondary | ICD-10-CM | POA: Insufficient documentation

## 2016-12-08 DIAGNOSIS — K219 Gastro-esophageal reflux disease without esophagitis: Secondary | ICD-10-CM | POA: Diagnosis not present

## 2016-12-08 DIAGNOSIS — M810 Age-related osteoporosis without current pathological fracture: Secondary | ICD-10-CM | POA: Diagnosis not present

## 2016-12-08 DIAGNOSIS — E669 Obesity, unspecified: Secondary | ICD-10-CM | POA: Insufficient documentation

## 2016-12-08 DIAGNOSIS — M19171 Post-traumatic osteoarthritis, right ankle and foot: Secondary | ICD-10-CM

## 2016-12-08 DIAGNOSIS — I252 Old myocardial infarction: Secondary | ICD-10-CM | POA: Insufficient documentation

## 2016-12-08 DIAGNOSIS — Z87891 Personal history of nicotine dependence: Secondary | ICD-10-CM | POA: Diagnosis not present

## 2016-12-08 DIAGNOSIS — M109 Gout, unspecified: Secondary | ICD-10-CM | POA: Insufficient documentation

## 2016-12-08 DIAGNOSIS — I10 Essential (primary) hypertension: Secondary | ICD-10-CM | POA: Insufficient documentation

## 2016-12-08 DIAGNOSIS — M4802 Spinal stenosis, cervical region: Secondary | ICD-10-CM

## 2016-12-08 DIAGNOSIS — Z6841 Body Mass Index (BMI) 40.0 and over, adult: Secondary | ICD-10-CM | POA: Diagnosis not present

## 2016-12-08 DIAGNOSIS — M797 Fibromyalgia: Secondary | ICD-10-CM | POA: Insufficient documentation

## 2016-12-08 DIAGNOSIS — E119 Type 2 diabetes mellitus without complications: Secondary | ICD-10-CM | POA: Insufficient documentation

## 2016-12-08 DIAGNOSIS — Z96653 Presence of artificial knee joint, bilateral: Secondary | ICD-10-CM | POA: Insufficient documentation

## 2016-12-08 MED ORDER — TRAMADOL HCL 50 MG PO TABS: 50.0000 mg | ORAL_TABLET | Freq: Two times a day (BID) | ORAL | 2 refills | Status: DC

## 2016-12-08 NOTE — Patient Instructions (Signed)
you should have 2 refills left on the tramadol prescription  Will do the radiofrequency procedure next month. Please see the pamphlet

## 2016-12-08 NOTE — Progress Notes (Signed)
Subjective:    Patient ID: Sandra Brennan, female    DOB: 1945-07-10, 71 y.o.   MRN: [id number]  HPI  Right ankle pain improved after injection with less swelling, seen by ortho 9/24 ordered AFO 9/24  B L3-4-5 MBB  10/06/2016 immediate pre/post pain score 7/10 to 2/10  9/10 Bilat L3-4-5 MBB pre /post 6/10, Later achieved 50% pain relief, which is starting to diminish.    Going back to water aerobics  Tramadol refill requested didn't fill last month per pt, was helpful in reducing pain.  Pain Inventory Average Pain 6 Pain Right Now 2 My pain is sharp and aching  In the last 24 hours, has pain interfered with the following? General activity 5 Relation with others 8 Enjoyment of life 8 What TIME of day is your pain at its worst? morning, evening Sleep (in general) Poor  Pain is worse with: walking, bending and standing Pain improves with: rest and pacing activities Relief from Meds: 7  Mobility walk with assistance use a cane how many minutes can you walk? 5 ability to climb steps?  yes do you drive?  no  Function retired Do you have any goals in this area?  yes  Neuro/Psych bladder control problems bowel control problems numbness tingling  Prior Studies Any changes since last visit?  no  Physicians involved in your care Any changes since last visit?  no   Family History  Problem Relation Age of Onset  . Arthritis Mother   . Arthritis Father   . Colon cancer Father   . Diabetes Sister   . Arthritis Sister   . Diabetes Maternal Grandmother   . Arthritis Maternal Grandmother   . Diabetes Maternal Grandfather   . Arthritis Maternal Grandfather    Social History   Social History  . Marital status: Widowed    Spouse name: N/A  . Number of children: 5  . Years of education: PhD   Occupational History  . Retired    Social History Main Topics  . Smoking status: Former Smoker    Packs/day: 0.12    Years: 2.00    Types: Cigarettes  . Smokeless  tobacco: Never Used     Comment: 08/03/2012 "quit 30 years ago"  . Alcohol use No     Comment: 08/03/2012 "used to be a beeralcoholic"; stopped ~ 30 yr ago"  . Drug use: No  . Sexual activity: No   Other Topics Concern  . None   Social History Narrative   Lives at home with her mother and caregiver.   Occasional use of caffeine.   Right-handed.   Past Surgical History:  Procedure Laterality Date  . ABDOMINAL HYSTERECTOMY  1990's  . GANGLION CYST EXCISION Right 1980's   "wrist" (08/03/2012)  . JOINT REPLACEMENT    . KNEE LIGAMENT RECONSTRUCTION Right 1980's  . TONSILLECTOMY  ~ 1954  . TOTAL HIP ARTHROPLASTY Left 08/03/2012  . TOTAL HIP ARTHROPLASTY Left 08/03/2012   Procedure: TOTAL HIP ARTHROPLASTY;  Surgeon: Garald Balding, MD;  Location: [facility];  Service: Orthopedics;  Laterality: Left;  Left Total Hip Arthroplasty  . TOTAL HIP ARTHROPLASTY Left 08/28/2012   Procedure: Irrigation and Debridement hip ;  Surgeon: Mcarthur Rossetti, MD;  Location: [facility];  Service: Orthopedics;  Laterality: Left;  . TOTAL KNEE ARTHROPLASTY Left 2001  . TOTAL KNEE ARTHROPLASTY Right 2004   Past Medical History:  Diagnosis Date  . Anemia   . Arthritis    "plenty" (08/03/2012)  .  Asthma   . Chronic lower back pain    "they say I need a whole new spinal column" (08-29-12)  . COPD (chronic obstructive pulmonary disease) ([place])   . Degenerative arthritis    "all over" (2012-08-29)  . GERD (gastroesophageal reflux disease)   . Gout 05/05/2013  . Hypertension   . Migraines    "used to; totally stopped when I quit drinking 30 yr ago" (2012/08/29)  . Myalgia and myositis   . Myocardial infarction [facility]) 1992   August 29, 2012 "mild MI when son died "  . Obesity   . Osteoporosis    "all over" (08-29-2012)  . Shortness of breath    when stressed.  . Sleep apnea    "never did fix my machine; haven't used one for 5 years; I've lost 116# since then; no problems now" (2012/08/29)  . Type II diabetes mellitus ([place])     "borderline; don't test; take Metformin" (August 29, 2012)   BP 113/75 (BP Location: Right Arm, Patient Position: Sitting, Cuff Size: Normal)   Pulse 75   Resp (!) 143   SpO2 98%   Opioid Risk Score:   Fall Risk Score:  `1  Depression screen PHQ 2/9  Depression screen [facility] 2/9 06/09/2016 05/01/2016  Decreased Interest 3 0  Down, Depressed, Hopeless 0 0  PHQ - 2 Score 3 0  Altered sleeping 2 -  Tired, decreased energy 3 -  Change in appetite 3 -  Feeling bad or failure about yourself  0 -  Trouble concentrating 0 -  Moving slowly or fidgety/restless 0 -  Suicidal thoughts 0 -  PHQ-9 Score 11 -  Difficult doing work/chores Somewhat difficult -    Review of Systems  Constitutional: Negative.   HENT: Negative.   Eyes: Negative.   Respiratory: Positive for apnea.   Cardiovascular: Positive for leg swelling.  Gastrointestinal: Positive for constipation.  Endocrine: Negative.   Genitourinary: Positive for dysuria, frequency and urgency.  Musculoskeletal: Positive for arthralgias, back pain and gait problem.  Skin: Negative.   Allergic/Immunologic: Negative.   Neurological: Positive for numbness.       Tingling  Hematological: Negative.   Psychiatric/Behavioral: Negative.   All other systems reviewed and are negative.      Objective:   Physical Exam  Constitutional: She is oriented to person, place, and time. She appears well-developed and well-nourished.  HENT:  Head: Normocephalic and atraumatic.  Eyes: Pupils are equal, round, and reactive to light. Conjunctivae and EOM are normal.  Neurological: She is alert and oriented to person, place, and time.  Psychiatric: She has a normal mood and affect.  Nursing note and vitals reviewed.   Right ankle joint selling and tenderness but no erythema Lumbar spine without tenderness to palpation except at the left lateral border of the paraspinal, as well as over the gluteus medius area. There is pain with extension greater than with  flexion. There is limited lumbar range of motion. Negative straight leg raising Sensation normal. No lower limbs. Cervical spine has tenderness over the lateral masses on the right side only. Range of motion is normal.  Spurling negative      Assessment & Plan:   1. Lumbar spondylosis without myelopathy has had relief times 2 with medial branch blocks. We will schedule for RFA Right L3, L4 medial branch and right L5 dorsal ramus. Next month  2. Ankle osteoarthritis, right side as well as chronic anterior talofibular and calcaneofibular tears. While she does not feel unstable to exam. She certainly  is at risk for further injury No need for repeat ultrasound guided injection. Agree with AFO

## 2017-01-05 ENCOUNTER — Encounter: Payer: Medicare Other | Attending: Physical Medicine & Rehabilitation

## 2017-01-05 ENCOUNTER — Ambulatory Visit (HOSPITAL_BASED_OUTPATIENT_CLINIC_OR_DEPARTMENT_OTHER): Payer: Medicare Other | Admitting: Physical Medicine & Rehabilitation

## 2017-01-05 ENCOUNTER — Encounter: Payer: Self-pay | Admitting: Physical Medicine & Rehabilitation

## 2017-01-05 VITALS — BP 135/81 | HR 76

## 2017-01-05 DIAGNOSIS — M19071 Primary osteoarthritis, right ankle and foot: Secondary | ICD-10-CM | POA: Insufficient documentation

## 2017-01-05 DIAGNOSIS — E119 Type 2 diabetes mellitus without complications: Secondary | ICD-10-CM | POA: Diagnosis not present

## 2017-01-05 DIAGNOSIS — E669 Obesity, unspecified: Secondary | ICD-10-CM | POA: Diagnosis not present

## 2017-01-05 DIAGNOSIS — M545 Low back pain: Secondary | ICD-10-CM | POA: Diagnosis present

## 2017-01-05 DIAGNOSIS — K219 Gastro-esophageal reflux disease without esophagitis: Secondary | ICD-10-CM | POA: Diagnosis not present

## 2017-01-05 DIAGNOSIS — Z833 Family history of diabetes mellitus: Secondary | ICD-10-CM | POA: Insufficient documentation

## 2017-01-05 DIAGNOSIS — M4802 Spinal stenosis, cervical region: Secondary | ICD-10-CM | POA: Insufficient documentation

## 2017-01-05 DIAGNOSIS — Z8261 Family history of arthritis: Secondary | ICD-10-CM | POA: Insufficient documentation

## 2017-01-05 DIAGNOSIS — J449 Chronic obstructive pulmonary disease, unspecified: Secondary | ICD-10-CM | POA: Insufficient documentation

## 2017-01-05 DIAGNOSIS — M47816 Spondylosis without myelopathy or radiculopathy, lumbar region: Secondary | ICD-10-CM

## 2017-01-05 DIAGNOSIS — M810 Age-related osteoporosis without current pathological fracture: Secondary | ICD-10-CM | POA: Diagnosis not present

## 2017-01-05 DIAGNOSIS — I252 Old myocardial infarction: Secondary | ICD-10-CM | POA: Insufficient documentation

## 2017-01-05 DIAGNOSIS — M109 Gout, unspecified: Secondary | ICD-10-CM | POA: Insufficient documentation

## 2017-01-05 DIAGNOSIS — Z6841 Body Mass Index (BMI) 40.0 and over, adult: Secondary | ICD-10-CM | POA: Diagnosis not present

## 2017-01-05 DIAGNOSIS — M797 Fibromyalgia: Secondary | ICD-10-CM | POA: Insufficient documentation

## 2017-01-05 DIAGNOSIS — Z96642 Presence of left artificial hip joint: Secondary | ICD-10-CM | POA: Insufficient documentation

## 2017-01-05 DIAGNOSIS — I10 Essential (primary) hypertension: Secondary | ICD-10-CM | POA: Diagnosis not present

## 2017-01-05 DIAGNOSIS — Z87891 Personal history of nicotine dependence: Secondary | ICD-10-CM | POA: Diagnosis not present

## 2017-01-05 DIAGNOSIS — M48061 Spinal stenosis, lumbar region without neurogenic claudication: Secondary | ICD-10-CM | POA: Diagnosis not present

## 2017-01-05 DIAGNOSIS — Z96653 Presence of artificial knee joint, bilateral: Secondary | ICD-10-CM | POA: Insufficient documentation

## 2017-01-05 MED ORDER — DIAZEPAM 5 MG PO TABS: 5.0000 mg | ORAL_TABLET | Freq: Once | ORAL | 0 refills | Status: AC

## 2017-01-05 NOTE — Progress Notes (Signed)
  PROCEDURE RECORD [facility]   Name: Sandra Brennan DOB:[birth year] MRN: [id number]  Date:01/05/2017  Physician: Alysia Penna, MD    Nurse/CMA: Wessling CMA  Allergies:  Allergies  Allergen Reactions  . Cymbalta [Duloxetine Hcl] Anaphylaxis  . Influenza Vaccines Hives and Itching  . Penicillins Hives  . Sulfa Antibiotics Other (See Comments)    REACTION: unknown  . Theophyllines Hives    Consent Signed: Yes.    Is patient diabetic? No.  CBG today? NA  Pregnant: No. LMP: No LMP recorded. Patient has had a hysterectomy. (age 83-55)  Anticoagulants: no Anti-inflammatory: no Antibiotics: no  Procedure: R L3-5 RFA Position: Prone   Start Time: 10:12am    End Time: 10:29am    Fluoro Time: 50  RN/CMA Bright CMA Wessling CMA    Time 949am 10:38am    BP 135/81 144/77    Pulse 76 70    Respirations 16 16    O2 Sat 94 91    S/S 6 6    Pain Level 9/10 9/10     D/C home with [name], patient A & O X 3, D/C instructions reviewed, and sits independently.

## 2017-01-05 NOTE — Progress Notes (Signed)
RightL5 dorsal ramus., Right L4 and Right L3 medial branch radio frequency neurotomy under fluoroscopic guidance   Indication: Low back pain due to lumbar spondylosis which has been relieved on 2 occasions by greater than 50% by lumbar medial branch blocks at corresponding levels.  Informed consent was obtained after describing risks and benefits of the procedure with the patient, this includes bleeding, bruising, infection, paralysis and medication side effects. The patient wishes to proceed and has given written consent. The patient was placed in a prone position. The lumbar and sacral area was marked and prepped with Betadine. A 25-gauge 1-1/2 inch needle was inserted into the skin and subcutaneous tissue at 3 sites in one ML of 1% lidocaine was injected into each site. Then a 18-gauge 15 cm radio frequency needle with a 1 cm curved active tip was inserted targeting the Right S1 SAP/sacral ala junction. Bone contact was made and confirmed with lateral imaging.  motor stimulation at 2 Hz confirm proper needle location followed by injection of 1ml 2% MPF lidocaine. Then the Right L5 SAP/transverse process junction was targeted. Bone contact was made and confirmed with lateral imaging.  motor stimulation at 2 Hz confirm proper needle location followed by injection of 1ml 2% MPF lidocaine. Then the Right L4 SAP/transverse process junction was targeted. Bone contact was made and confirmed with lateral imaging. motor stimulation at 2 Hz confirm proper needle location followed by injection of 1ml 2% MPF lidocaine. Radio frequency lesion being at 80C for 90 seconds was performed. Needles were removed. Post procedure instructions and vital signs were performed. Patient tolerated procedure well. Followup appointment was given. 

## 2017-01-05 NOTE — Patient Instructions (Addendum)
You had a radio frequency procedure today This was done to alleviate joint pain in your lumbar area,  We will perform the same procedure on the left side next month  You may experience soreness at the injection sites.This soreness may last for several weeks You may also experienced some irritation of the nerves that were heated I'm recommending ice for 30 minutes every 2 hours as needed for the next 24-48 hours

## 2017-02-02 ENCOUNTER — Encounter: Payer: Self-pay | Admitting: Physical Medicine & Rehabilitation

## 2017-02-02 ENCOUNTER — Encounter: Payer: Medicare Other | Attending: Physical Medicine & Rehabilitation

## 2017-02-02 ENCOUNTER — Ambulatory Visit: Payer: Medicare Other | Admitting: Physical Medicine & Rehabilitation

## 2017-02-02 ENCOUNTER — Other Ambulatory Visit: Payer: Self-pay

## 2017-02-02 VITALS — BP 124/76 | HR 80

## 2017-02-02 DIAGNOSIS — E669 Obesity, unspecified: Secondary | ICD-10-CM | POA: Insufficient documentation

## 2017-02-02 DIAGNOSIS — M48061 Spinal stenosis, lumbar region without neurogenic claudication: Secondary | ICD-10-CM | POA: Insufficient documentation

## 2017-02-02 DIAGNOSIS — M47816 Spondylosis without myelopathy or radiculopathy, lumbar region: Secondary | ICD-10-CM | POA: Insufficient documentation

## 2017-02-02 DIAGNOSIS — Z96642 Presence of left artificial hip joint: Secondary | ICD-10-CM | POA: Insufficient documentation

## 2017-02-02 DIAGNOSIS — E119 Type 2 diabetes mellitus without complications: Secondary | ICD-10-CM | POA: Insufficient documentation

## 2017-02-02 DIAGNOSIS — I10 Essential (primary) hypertension: Secondary | ICD-10-CM | POA: Insufficient documentation

## 2017-02-02 DIAGNOSIS — Z6841 Body Mass Index (BMI) 40.0 and over, adult: Secondary | ICD-10-CM | POA: Insufficient documentation

## 2017-02-02 DIAGNOSIS — M109 Gout, unspecified: Secondary | ICD-10-CM | POA: Insufficient documentation

## 2017-02-02 DIAGNOSIS — Z87891 Personal history of nicotine dependence: Secondary | ICD-10-CM | POA: Insufficient documentation

## 2017-02-02 DIAGNOSIS — Z96653 Presence of artificial knee joint, bilateral: Secondary | ICD-10-CM | POA: Insufficient documentation

## 2017-02-02 DIAGNOSIS — M19071 Primary osteoarthritis, right ankle and foot: Secondary | ICD-10-CM | POA: Insufficient documentation

## 2017-02-02 DIAGNOSIS — M4802 Spinal stenosis, cervical region: Secondary | ICD-10-CM | POA: Insufficient documentation

## 2017-02-02 DIAGNOSIS — K219 Gastro-esophageal reflux disease without esophagitis: Secondary | ICD-10-CM | POA: Insufficient documentation

## 2017-02-02 DIAGNOSIS — M810 Age-related osteoporosis without current pathological fracture: Secondary | ICD-10-CM | POA: Insufficient documentation

## 2017-02-02 DIAGNOSIS — J449 Chronic obstructive pulmonary disease, unspecified: Secondary | ICD-10-CM | POA: Insufficient documentation

## 2017-02-02 DIAGNOSIS — I252 Old myocardial infarction: Secondary | ICD-10-CM | POA: Insufficient documentation

## 2017-02-02 DIAGNOSIS — Z833 Family history of diabetes mellitus: Secondary | ICD-10-CM | POA: Insufficient documentation

## 2017-02-02 DIAGNOSIS — M797 Fibromyalgia: Secondary | ICD-10-CM | POA: Insufficient documentation

## 2017-02-02 DIAGNOSIS — Z8261 Family history of arthritis: Secondary | ICD-10-CM | POA: Insufficient documentation

## 2017-02-02 MED ORDER — PREGABALIN 75 MG PO CAPS: 75.0000 mg | ORAL_CAPSULE | Freq: Two times a day (BID) | ORAL | 5 refills | Status: DC

## 2017-02-02 MED ORDER — TRAMADOL HCL 50 MG PO TABS: 50.0000 mg | ORAL_TABLET | Freq: Two times a day (BID) | ORAL | 5 refills | Status: DC

## 2017-02-02 NOTE — Progress Notes (Signed)
  PROCEDURE RECORD [facility]   Name: JOWANA THUMMA DOB:1945/03/10 MRN: [id number]  Date:02/02/2017  Physician: Alysia Penna, MD    Nurse/CMA: Bright CMA   Allergies:  Allergies  Allergen Reactions  . Cymbalta [Duloxetine Hcl] Anaphylaxis  . Influenza Vaccines Hives and Itching  . Penicillins Hives  . Sulfa Antibiotics Other (See Comments)    REACTION: unknown  . Theophyllines Hives    Consent Signed: Yes.    Is patient diabetic? No.  CBG today? NA  Pregnant: No. LMP: No LMP recorded. Patient has had a hysterectomy. (age [age])  Anticoagulants: no Anti-inflammatory: no Antibiotics: no  Procedure: Left L3-5 RFA Position: Prone   Start Time:  End Time:  Fluoro Time:   RN/CMA Wessling CMA     Time 10:00am     BP 124/76     Pulse 80     Respirations 14     O2 Sat 96     S/S 6 6    Pain Level 5/10      D/C home with daughter, patient A & O X 3, D/C instructions reviewed, and sits independently.

## 2017-02-02 NOTE — Progress Notes (Signed)
Only 1-2 d improvement after R RFA , now back to baseline.  Will not perform Left RFA We will continue current medications which include Lyrica 75 mg twice daily Tramadol 50 mg twice daily Return to clinic in 6 months with nurse practitioner

## 2017-02-26 ENCOUNTER — Ambulatory Visit (INDEPENDENT_AMBULATORY_CARE_PROVIDER_SITE_OTHER): Payer: Medicare Other | Admitting: Nurse Practitioner

## 2017-02-26 VITALS — BP 120/68 | HR 79 | Temp 97.9°F | Ht 60.0 in | Wt 233.0 lb

## 2017-02-26 DIAGNOSIS — K921 Melena: Secondary | ICD-10-CM | POA: Diagnosis not present

## 2017-02-26 DIAGNOSIS — M1A00X0 Idiopathic chronic gout, unspecified site, without tophus (tophi): Secondary | ICD-10-CM | POA: Diagnosis not present

## 2017-02-26 DIAGNOSIS — J45909 Unspecified asthma, uncomplicated: Secondary | ICD-10-CM | POA: Diagnosis not present

## 2017-02-26 DIAGNOSIS — R0981 Nasal congestion: Secondary | ICD-10-CM

## 2017-02-26 DIAGNOSIS — M1A9XX0 Chronic gout, unspecified, without tophus (tophi): Secondary | ICD-10-CM

## 2017-02-26 DIAGNOSIS — E1142 Type 2 diabetes mellitus with diabetic polyneuropathy: Secondary | ICD-10-CM

## 2017-02-26 DIAGNOSIS — E782 Mixed hyperlipidemia: Secondary | ICD-10-CM | POA: Diagnosis not present

## 2017-02-26 DIAGNOSIS — I10 Essential (primary) hypertension: Secondary | ICD-10-CM | POA: Diagnosis not present

## 2017-02-26 DIAGNOSIS — K5900 Constipation, unspecified: Secondary | ICD-10-CM

## 2017-02-26 DIAGNOSIS — E559 Vitamin D deficiency, unspecified: Secondary | ICD-10-CM

## 2017-02-26 LAB — BASIC METABOLIC PANEL
BUN: 15 mg/dL (ref 6–23)
CO2: 28 mEq/L (ref 19–32)
CREATININE: 1.24 mg/dL — AB (ref 0.40–1.20)
Calcium: 8.8 mg/dL (ref 8.4–10.5)
Chloride: 102 mEq/L (ref 96–112)
GFR: 55.04 mL/min — AB (ref >60.00)
Glucose, Bld: 69 mg/dL — ABNORMAL LOW (ref 70–99)
Potassium: 5.6 mEq/L — ABNORMAL HIGH (ref 3.5–5.1)
Sodium: 137 mEq/L (ref 135–145)

## 2017-02-26 LAB — LIPID PANEL
CHOL/HDL RATIO: 2
Cholesterol: 119 mg/dL (ref 0–200)
HDL: 57.1 mg/dL (ref >39.00)
LDL Cholesterol: 50 mg/dL (ref 0–99)
NONHDL: 61.42
Triglycerides: 57 mg/dL (ref 0.0–149.0)
VLDL: 11.4 mg/dL (ref 0.0–40.0)

## 2017-02-26 LAB — URIC ACID: URIC ACID, SERUM: 4.4 mg/dL (ref 2.4–7.0)

## 2017-02-26 LAB — HEPATIC FUNCTION PANEL
ALBUMIN: 3.5 g/dL (ref 3.5–5.2)
ALK PHOS: 99 U/L (ref 39–117)
ALT: 12 U/L (ref 0–35)
AST: 22 U/L (ref 0–37)
BILIRUBIN TOTAL: 0.5 mg/dL (ref 0.2–1.2)
Bilirubin, Direct: 0.1 mg/dL (ref 0.0–0.3)
Total Protein: 6.5 g/dL (ref 6.0–8.3)

## 2017-02-26 LAB — TSH: TSH: 2.8 u[IU]/mL (ref 0.35–4.50)

## 2017-02-26 MED ORDER — IPRATROPIUM BROMIDE 0.03 % NA SOLN: 2.0000 | Freq: Two times a day (BID) | NASAL | 0 refills | Status: DC

## 2017-02-26 MED ORDER — POLYETHYLENE GLYCOL 3350 17 GM/SCOOP PO POWD: 17.0000 g | Freq: Every day | ORAL | 3 refills | Status: DC

## 2017-02-26 MED ORDER — ALBUTEROL SULFATE HFA 108 (90 BASE) MCG/ACT IN AERS: 1.0000 | INHALATION_SPRAY | Freq: Four times a day (QID) | RESPIRATORY_TRACT | 5 refills | Status: DC | PRN

## 2017-02-26 NOTE — Patient Instructions (Addendum)
Stable renal function with mild elevation in potassium. Need to avoid potassium rich foods: potatoes, banana, sodas, salt substitute etc. Glucose was low. Make aure you have small frequent meals. Improved uric acid. Normal lipid panel, liver function, TSH Stable cbc and controlled DM  URI Instructions: Encourage adequate oral hydration.  Use over-the-counter  "cold" medicines  such as "Tylenol cold" , "Advil cold",  "Mucinex" or" Mucinex D"  for cough and congestion.  Avoid decongestants if you have high blood pressure. Use" Delsym" or" Robitussin" cough syrup varietis for cough.  You can use plain "Tylenol" or "Advi"l for fever, chills and achyness.   "Common cold" symptoms are usually triggered by a virus.  The antibiotics are usually not necessary. On average, a" viral cold" illness would take 4-7 days to resolve. Please, make an appointment if you are not better or if you're worse.  Encourage adequate oral hydration.  Constipation, Adult Constipation is when a person has fewer bowel movements in a week than normal, has difficulty having a bowel movement, or has stools that are dry, hard, or larger than normal. Constipation may be caused by an underlying condition. It may become worse with age if a person takes certain medicines and does not take in enough fluids. Follow these instructions at home: Eating and drinking   Eat foods that have a lot of fiber, such as fresh fruits and vegetables, whole grains, and beans.  Limit foods that are high in fat, low in fiber, or overly processed, such as french fries, hamburgers, cookies, candies, and soda.  Drink enough fluid to keep your urine clear or pale yellow. General instructions  Exercise regularly or as told by your health care provider.  Go to the restroom when you have the urge to go. Do not hold it in.  Take over-the-counter and prescription medicines only as told by your health care provider. These include any fiber  supplements.  Practice pelvic floor retraining exercises, such as deep breathing while relaxing the lower abdomen and pelvic floor relaxation during bowel movements.  Watch your condition for any changes.  Keep all follow-up visits as told by your health care provider. This is important. Contact a health care provider if:  You have pain that gets worse.  You have a fever.  You do not have a bowel movement after 4 days.  You vomit.  You are not hungry.  You lose weight.  You are bleeding from the anus.  You have thin, pencil-like stools. Get help right away if:  You have a fever and your symptoms suddenly get worse.  You leak stool or have blood in your stool.  Your abdomen is bloated.  You have severe pain in your abdomen.  You feel dizzy or you faint. This information is not intended to replace advice given to you by your health care provider. Make sure you discuss any questions you have with your health care provider. Document Released: 11/16/2003 Document Revised: 09/07/2015 Document Reviewed: 08/08/2015 Elsevier Interactive Patient Education  2018 [employer].

## 2017-02-26 NOTE — Progress Notes (Addendum)
Subjective:  Patient ID: Sandra Brennan, female    DOB: Jun 15, 1945  Age: 71 y.o. MRN: [id number]  CC: Follow-up (follow up DM,HTN,Lipid/ req refill inhaler) and Constipation (1 mo ago notice blood in stool--has been having problem with BM/1 time/ pain left arm when urinate?)   Constipation  This is a new problem. The current episode started more than 1 month ago. The problem has been waxing and waning since onset. Her stool frequency is 2 to 3 times per week. The stool is described as pellet like. The patient is not on a high fiber diet. She does not exercise regularly. There has not been adequate water intake. Associated symptoms include bloating. Pertinent negatives include no difficulty urinating, fecal incontinence, hematochezia, melena, nausea, rectal pain, vomiting or weight loss. Risk factors include change in medication usage/dosage. She has tried laxatives for the symptoms. The treatment provided mild relief.   Blood in stool after straining Reports chronic intermittent  Use of citrate mag and senna prn. Onset with hydrocodone and tramadol for back and joint pain.  Nasal congestion with post nasaldrip: Onset yesterday. No fever, no cough, no SOB.  HTN: Stable with lisinopril HCTZ. BP Readings from Last 3 Encounters:  02/26/17 120/68  02/02/17 124/76  01/05/17 135/81   DM: Controlled. No medication at this time. Does not check glucose at home.  Gout: Denies any flare since last OV. Current use of allopurinol.  Asthma: Controlled. Use of albuterol prn. No nocturnal symptoms. Needs albuterol refilled.  Outpatient Medications Prior to Visit  Medication Sig Dispense Refill  . aspirin EC 81 MG tablet Take 81 mg by mouth daily.    . cetirizine (ZYRTEC) 10 MG tablet Take 1 tablet (10 mg total) by mouth daily. 30 tablet 0  . nortriptyline (PAMELOR) 10 MG capsule Take 2 capsules (20 mg total) by mouth at bedtime. Please call [phone] to schedule appt with Dr. Krista Blue. 180  capsule 1  . pregabalin (LYRICA) 75 MG capsule Take 1 capsule (75 mg total) by mouth 2 (two) times daily. 60 capsule 5  . Sennosides 8.6 MG CAPS Take by mouth.    . traMADol (ULTRAM) 50 MG tablet Take 1 tablet (50 mg total) by mouth 2 (two) times daily. 60 tablet 5  . Vitamin D, Ergocalciferol, (DRISDOL) 50000 units CAPS capsule Take 1 capsule (50,000 Units total) by mouth once a week. 12 capsule 1  . albuterol (PROVENTIL HFA;VENTOLIN HFA) 108 (90 BASE) MCG/ACT inhaler Inhale 2 puffs into the lungs every 6 (six) hours as needed for wheezing.    Marland Kitchen allopurinol (ZYLOPRIM) 100 MG tablet Take 1 tablet (100 mg total) by mouth 2 (two) times daily. 180 tablet 1  . atorvastatin (LIPITOR) 10 MG tablet Take 1 tablet (10 mg total) by mouth daily at 6 PM. 90 tablet 1  . ciprofloxacin (CIPRO) 500 MG tablet TAKE 1 TABLET (500 MG TOTAL) BY MOUTH 2 (TWO) TIMES DAILY. WITH FOOD  0  . Colchicine 0.6 MG CAPS Take 1 capsule by mouth daily. 30 capsule 0  . fluticasone (FLONASE) 50 MCG/ACT nasal spray Place 2 sprays into both nostrils daily. 16 g 0  . lisinopril-hydrochlorothiazide (ZESTORETIC) 10-12.5 MG tablet Take 1 tablet by mouth daily. 90 tablet 1   No facility-administered medications prior to visit.     ROS See HPI  Objective:  BP 120/68   Pulse 79   Temp 97.9 F (36.6 C) (Oral)   Ht 5' (1.524 m)   Wt 233 lb (105.7 kg)  SpO2 99%   BMI 45.50 kg/m   BP Readings from Last 3 Encounters:  02/26/17 120/68  02/02/17 124/76  01/05/17 135/81    Wt Readings from Last 3 Encounters:  02/26/17 233 lb (105.7 kg)  11/24/16 200 lb (90.7 kg)  09/09/16 217 lb (98.4 kg)    Physical Exam  Constitutional: She is oriented to person, place, and time. No distress.  Cardiovascular: Normal rate and regular rhythm.  Pulmonary/Chest: Effort normal and breath sounds normal.  Abdominal: Soft. Bowel sounds are normal. There is no tenderness.  Musculoskeletal: She exhibits edema.  Neurological: She is alert and  oriented to person, place, and time.  Vitals reviewed.   Lab Results  Component Value Date   WBC 5.0 02/27/2017   HGB 10.3 (L) 02/27/2017   HCT 32.0 (L) 02/27/2017   PLT 189.0 02/27/2017   GLUCOSE 69 (L) 02/26/2017   CHOL 119 02/26/2017   TRIG 57.0 02/26/2017   HDL 57.10 02/26/2017   LDLCALC 50 02/26/2017   ALT 12 02/26/2017   AST 22 02/26/2017   NA 137 02/26/2017   K 5.6 (H) 02/26/2017   CL 102 02/26/2017   CREATININE 1.24 (H) 02/26/2017   BUN 15 02/26/2017   CO2 28 02/26/2017   TSH 2.80 02/26/2017   INR 1.17 08/27/2012   HGBA1C 5.4 02/27/2017    No results found.  Assessment & Plan:   Zakya was seen today for follow-up and constipation.  Diagnoses and all orders for this visit:  Type 2 diabetes mellitus with diabetic polyneuropathy, without long-term current use of insulin (HCC) -     Cancel: Hemoglobin A1c -     Basic metabolic panel -     Hepatic function panel -     Hemoglobin A1c; Future  Essential hypertension -     Basic metabolic panel  Chronic asthma without complication, unspecified asthma severity, unspecified whether persistent -     albuterol (PROVENTIL HFA;VENTOLIN HFA) 108 (90 Base) MCG/ACT inhaler; Inhale 1-2 puffs into the lungs every 6 (six) hours as needed for wheezing.  Chronic gouty arthritis -     Uric acid -     allopurinol (ZYLOPRIM) 100 MG tablet; Take 1 tablet (100 mg total) by mouth daily.  Mixed hyperlipidemia -     Lipid panel -     Hepatic function panel -     atorvastatin (LIPITOR) 10 MG tablet; Take 1 tablet (10 mg total) by mouth daily at 6 PM.  Vitamin D deficiency -     Vitamin D 1,25 dihydroxy  Constipation, unspecified constipation type -     polyethylene glycol powder (GLYCOLAX/MIRALAX) powder; Take 17 g by mouth daily. -     TSH  Blood present in stool -     Cancel: CBC w/Diff -     CBC; Future  Nasal congestion -     Discontinue: ipratropium (ATROVENT) 0.03 % nasal spray; Place 2 sprays into both nostrils 2  (two) times daily. Do not use for more than 5days.  Chronic gout involving toe of right foot without tophus, unspecified cause -     allopurinol (ZYLOPRIM) 100 MG tablet; Take 1 tablet (100 mg total) by mouth daily.  Hypertension, unspecified type -     lisinopril-hydrochlorothiazide (ZESTORETIC) 10-12.5 MG tablet; Take 1 tablet by mouth daily.   I have discontinued [name]. Vandervoort's fluticasone, Colchicine, ciprofloxacin, and ipratropium. I have also changed her albuterol and allopurinol. Additionally, I am having her start on polyethylene glycol powder. Lastly, I  am having her maintain her aspirin EC, cetirizine, Vitamin D (Ergocalciferol), nortriptyline, traMADol, pregabalin, Sennosides, lisinopril-hydrochlorothiazide, and atorvastatin.  Meds ordered this encounter  Medications  . polyethylene glycol powder (GLYCOLAX/MIRALAX) powder    Sig: Take 17 g by mouth daily.    Dispense:  850 g    Refill:  3    Order Specific Question:   Supervising Provider    Answer:   Lucille Passy [[id number]]  . DISCONTD: ipratropium (ATROVENT) 0.03 % nasal spray    Sig: Place 2 sprays into both nostrils 2 (two) times daily. Do not use for more than 5days.    Dispense:  30 mL    Refill:  0    Order Specific Question:   Supervising Provider    Answer:   Lucille Passy [[id number]]  . albuterol (PROVENTIL HFA;VENTOLIN HFA) 108 (90 Base) MCG/ACT inhaler    Sig: Inhale 1-2 puffs into the lungs every 6 (six) hours as needed for wheezing.    Dispense:  1 Inhaler    Refill:  5    Order Specific Question:   Supervising Provider    Answer:   Lucille Passy [[id number]]  . allopurinol (ZYLOPRIM) 100 MG tablet    Sig: Take 1 tablet (100 mg total) by mouth daily.    Dispense:  90 tablet    Refill:  3    Order Specific Question:   Supervising Provider    Answer:   Lucille Passy [[id number]]  . lisinopril-hydrochlorothiazide (ZESTORETIC) 10-12.5 MG tablet    Sig: Take 1 tablet by mouth daily.    Dispense:  90 tablet    Refill:  3     Order Specific Question:   Supervising Provider    Answer:   Lucille Passy [[id number]]  . atorvastatin (LIPITOR) 10 MG tablet    Sig: Take 1 tablet (10 mg total) by mouth daily at 6 PM.    Dispense:  90 tablet    Refill:  3    Dispense as insurance required.    Order Specific Question:   Supervising Provider    Answer:   Lucille Passy [[id number]]    Follow-up: Return in about 6 months (around 08/27/2017) for DM and HTN, hyperlipidemia.  Wilfred Lacy, NP

## 2017-02-27 ENCOUNTER — Other Ambulatory Visit (INDEPENDENT_AMBULATORY_CARE_PROVIDER_SITE_OTHER): Payer: Medicare Other

## 2017-02-27 ENCOUNTER — Encounter: Payer: Self-pay | Admitting: Nurse Practitioner

## 2017-02-27 DIAGNOSIS — K921 Melena: Secondary | ICD-10-CM | POA: Diagnosis not present

## 2017-02-27 DIAGNOSIS — E1142 Type 2 diabetes mellitus with diabetic polyneuropathy: Secondary | ICD-10-CM | POA: Diagnosis not present

## 2017-02-27 LAB — CBC
HEMATOCRIT: 32 % — AB (ref 36.0–46.0)
Hemoglobin: 10.3 g/dL — ABNORMAL LOW (ref 12.0–15.0)
MCHC: 32.3 g/dL (ref 30.0–36.0)
MCV: 94.2 fl (ref 78.0–100.0)
PLATELETS: 189 10*3/uL (ref 150.0–400.0)
RBC: 3.39 Mil/uL — AB (ref 3.87–5.11)
RDW: 14.1 % (ref 11.5–15.5)
WBC: 5 10*3/uL (ref 4.0–10.5)

## 2017-02-27 LAB — HEMOGLOBIN A1C: HEMOGLOBIN A1C: 5.4 % (ref 4.6–6.5)

## 2017-02-27 LAB — HM MAMMOGRAPHY

## 2017-02-27 MED ORDER — LISINOPRIL-HYDROCHLOROTHIAZIDE 10-12.5 MG PO TABS: 1.0000 | ORAL_TABLET | Freq: Every day | ORAL | 3 refills | Status: DC

## 2017-02-27 MED ORDER — ATORVASTATIN CALCIUM 10 MG PO TABS: 10.0000 mg | ORAL_TABLET | Freq: Every day | ORAL | 3 refills | Status: DC

## 2017-02-27 MED ORDER — ALLOPURINOL 100 MG PO TABS: 100.0000 mg | ORAL_TABLET | Freq: Every day | ORAL | 3 refills | Status: DC

## 2017-03-01 LAB — VITAMIN D 1,25 DIHYDROXY
VITAMIN D 1, 25 (OH) TOTAL: 20 pg/mL (ref 18–72)
Vitamin D2 1, 25 (OH)2: 20 pg/mL
Vitamin D3 1, 25 (OH)2: <8 pg/mL

## 2017-03-25 IMAGING — MR MR CERVICAL SPINE W/O CM
4 of 5 series · 27 of 48 positions shown · non-contrast
Comparison: Cervical spine radiographs September 05, 2015

CLINICAL DATA: Neck and RIGHT shoulder pain for 1 month. Fell 3
months ago.

EXAM:
MRI CERVICAL SPINE WITHOUT CONTRAST
TECHNIQUE: Multiplanar, multisequence MR imaging of the cervical spine was
performed. No intravenous contrast was administered.

[Series 3: tir sag · sagittal · 3.0mm · 0.39mm/px · 5 of 13 slices shown]
[im 1/13]
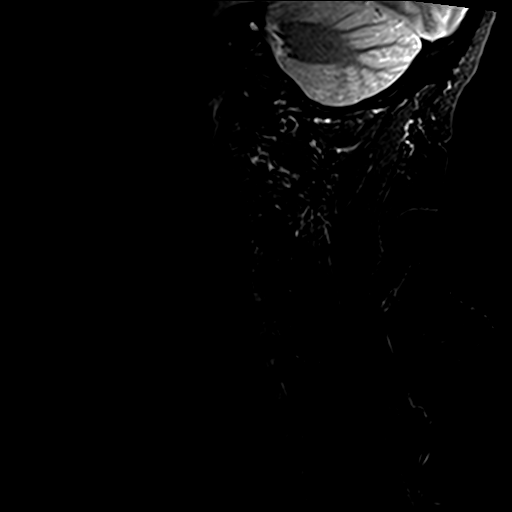
[im 3/13]
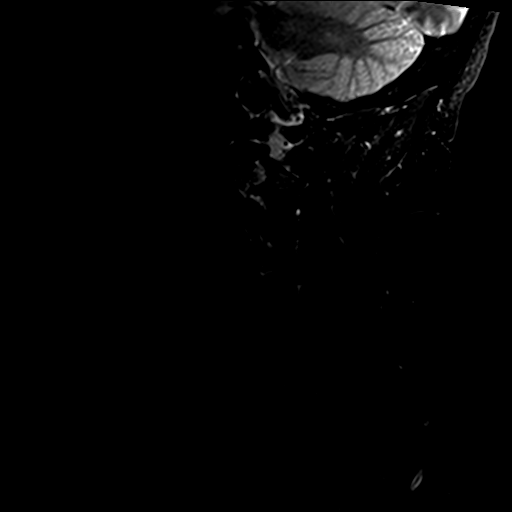
[im 5/13]
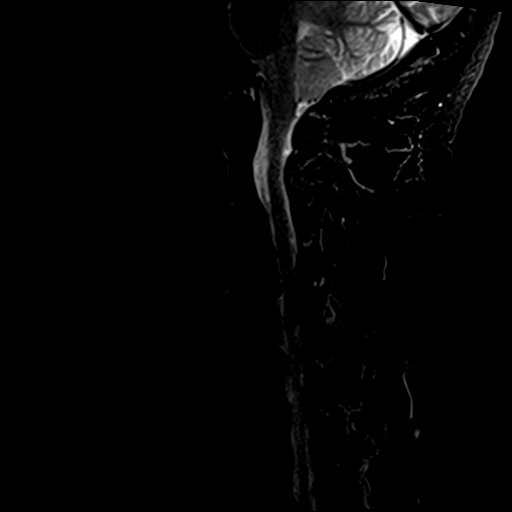
[im 8/13]
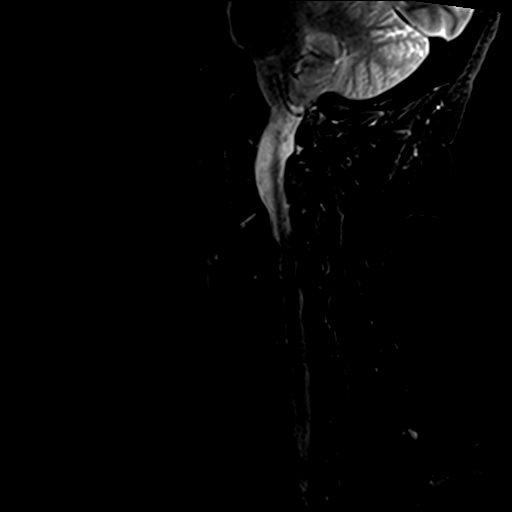
[im 13/13]
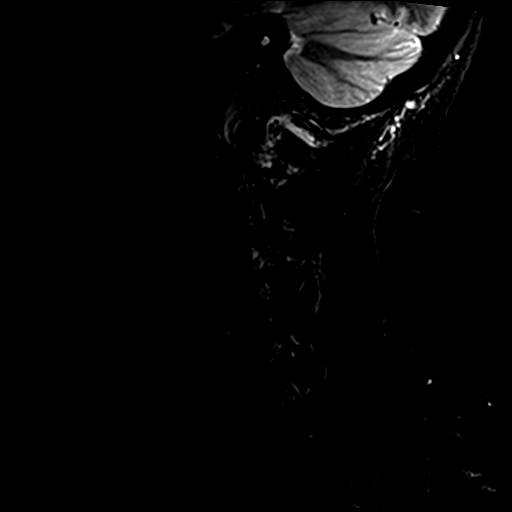

[Series 4: T1 · sagittal · 3.0mm · 0.39mm/px · 7 of 13 slices shown]
[im 1/13]
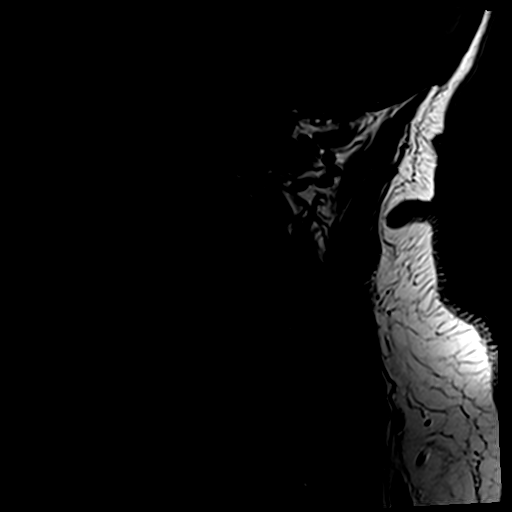
[im 3/13]
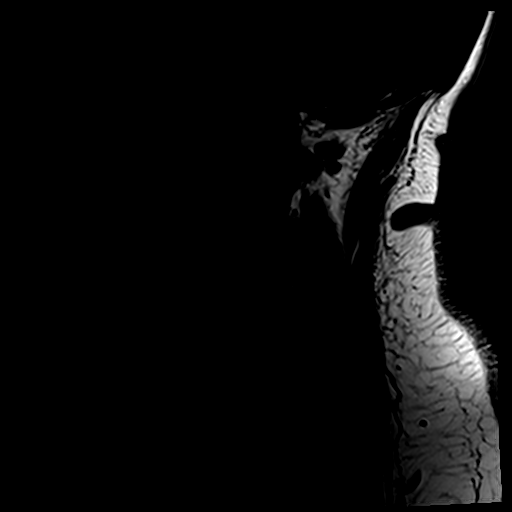
[im 5/13]
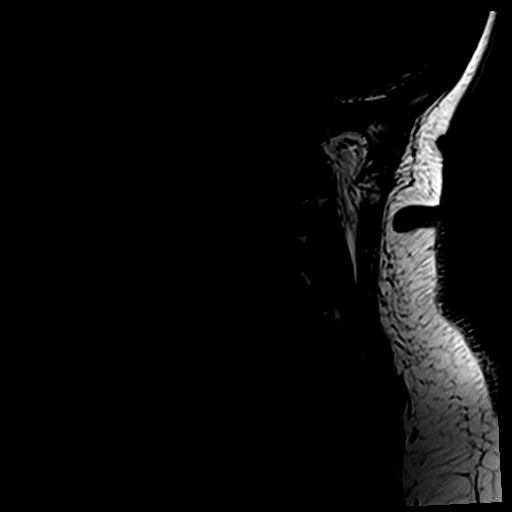
[im 7/13]
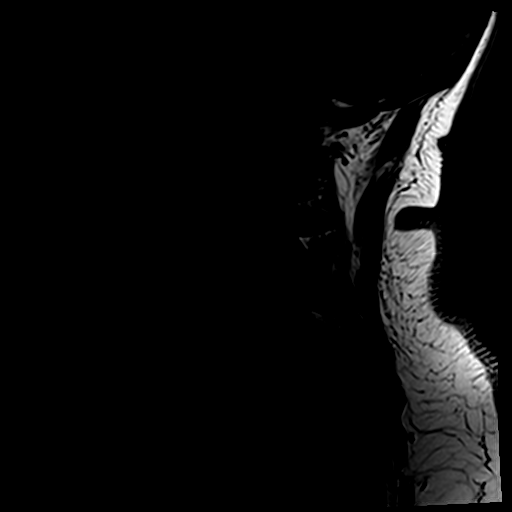
[im 9/13]
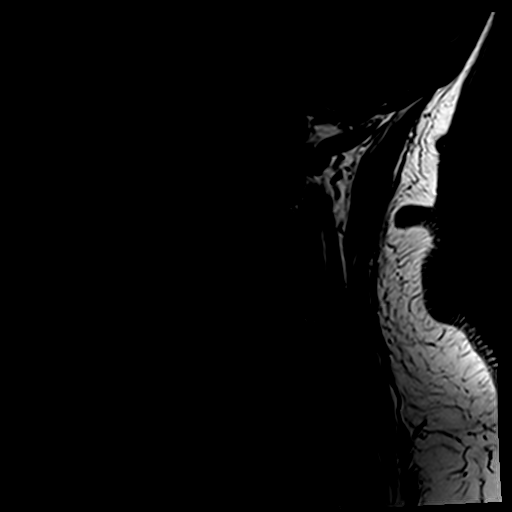
[im 11/13]
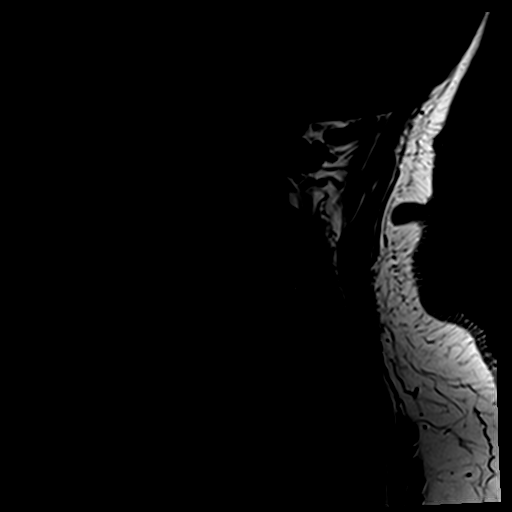
[im 13/13]
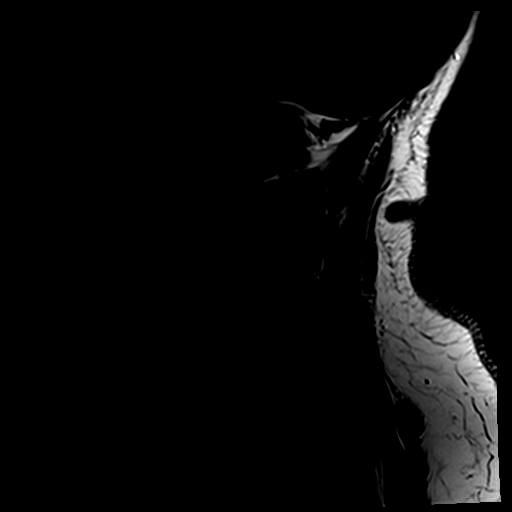

[Series 5: T2 · sagittal · 3.0mm · 0.62mm/px · 7 of 13 slices shown (1 of 2)]
[im 1/13]
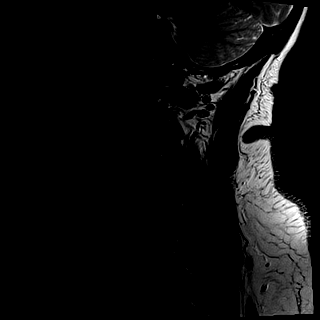
[im 3/13]
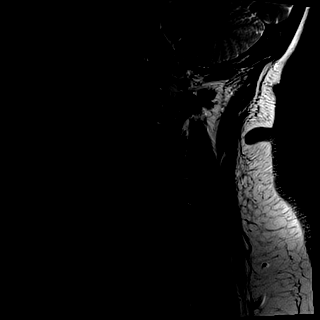
[im 5/13]
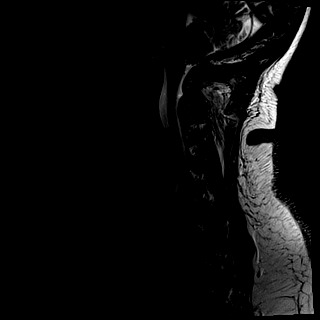
[im 7/13]
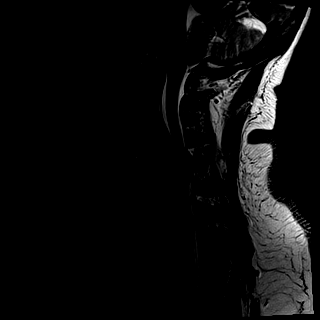
[im 9/13]
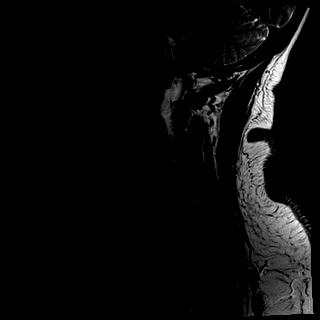
[im 11/13]
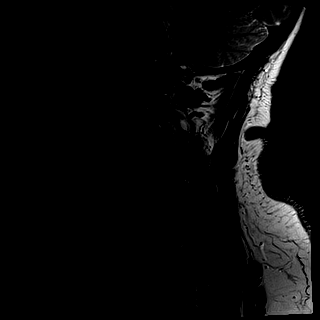
[im 13/13]
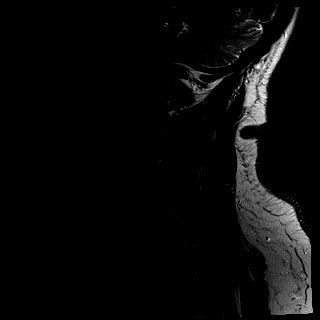

[Series 7: T2 · axial · 3.0mm · 0.70mm/px · z∈[-49,+35]mm · 8 of 26 slices shown (2 of 2)]
[im 1/26]
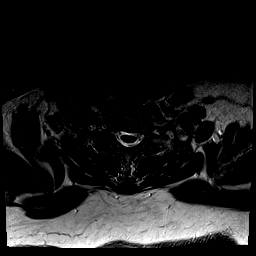
[im 4/26]
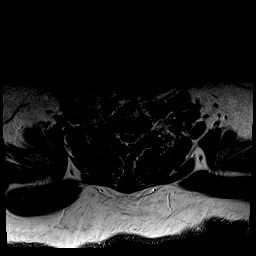
[im 8/26]
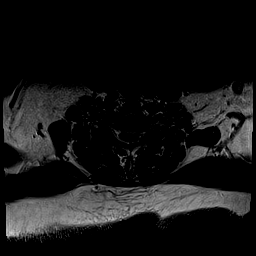
[im 12/26]
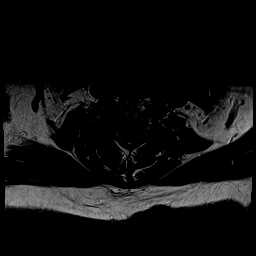
[im 14/26]
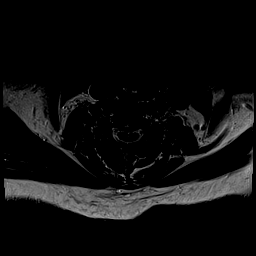
[im 18/26]
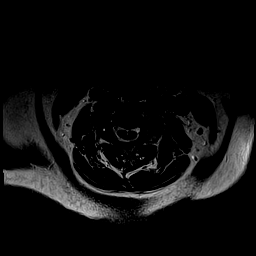
[im 22/26]
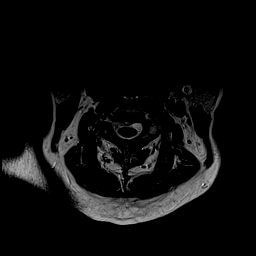
[im 26/26]
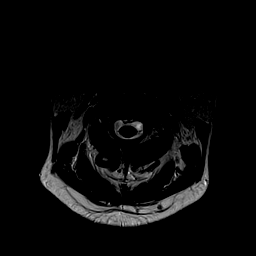

[27 of 48 positions shown; findings below may reference images not displayed]

FINDINGS: ALIGNMENT: Straightened cervical lordosis.  No malalignment.

VERTEBRAE/DISCS: C2-3 segmentation anomaly. Moderate C4-5 through
C7-T1 disc height loss, moderate to severe chronic discogenic
endplate changes C4-5 through C6-7, moderate C7-T1. Decreased T2
signal within the cervical disc compatible with desiccation. No STIR
signal abnormality to suggest acute osseous process. Borderline
congenital canal narrowing on the basis of foreshortened pedicles.

CORD:Cervical spinal cord is normal morphology and signal
characteristics from the cervicomedullary junction to level of T1-2,
the most caudal well visualized level.

POSTERIOR FOSSA, VERTEBRAL ARTERIES, PARASPINAL TISSUES: No MR
findings of ligamentous injury. Vertebral artery flow voids present.
Cerebellar tonsils descend 4 mm below the foramen magnum, slightly
pointed in appearance with effacement of cerebral spinal fluid
space, associated with C2 synovial proliferation.

DISC LEVELS: C2-3: Segmentation anomaly. No canal stenosis or neural
foraminal narrowing.

C3-4: Small central disc protrusion, uncovertebral hypertrophy and
mild facet arthropathy. Mild canal stenosis. Moderate bilateral
neural foraminal narrowing.

C4-5: Small broad-based disc bulge, uncovertebral hypertrophy and
mild facet arthropathy. Mild canal stenosis. Mild RIGHT, moderate to
severe LEFT neural foraminal narrowing.

C5-6: Small broad-based RIGHT central to subarticular disc
protrusion. Uncovertebral hypertrophy. Moderate RIGHT, mild LEFT
facet arthropathy. Mild to moderate canal stenosis. Moderate to
severe RIGHT neural foraminal narrowing.

C6-7: Small broad-based disc bulge asymmetric to the LEFT.
Uncovertebral hypertrophy and mild LEFT facet arthropathy. Mild
canal stenosis. Moderate bilateral neural foraminal narrowing.

C7-T1: 3 mm broad-based disc bulge. Uncovertebral hypertrophy and
mild facet arthropathy. Mild canal stenosis. Moderate RIGHT, mild
LEFT neural foraminal narrowing.
IMPRESSION: Straightened cervical lordosis without acute fracture or
malalignment.

C2-3 segmentation anomaly. Mild C2 synovial proliferation resulting
in crowded craniocervical junction without Chiari 1 malformation.

Mild to moderate canal stenosis C5-6, mild at C3-4, C4-5, C6-7 and
C7-T1.

Neural foraminal narrowing C3-4 thru C7-T1: Moderate to severe on
the LEFT at C4-5 and on the RIGHT at C5-6.

## 2017-03-27 NOTE — Progress Notes (Signed)
Subjective:   Sandra Brennan is a 72 y.o. female who presents for Medicare Annual (Subsequent) preventive examination. The Patient was informed that the wellness visit is to identify future health risk and educate and initiate measures that can reduce risk for increased disease through the lifespan.   Describes health as fair, good or great? good  Review of Systems: No ROS.  Medicare Wellness Visit. Additional risk factors are reflected in the social history. Cardiac Risk Factors include: advanced age (>44men, >49 women);diabetes mellitus;dyslipidemia;obesity (BMI >30kg/m2);sedentary lifestyle;hypertension Sleep patterns: Sleeps in recliner. Wears CPAP. Sleep varies.Naps 3 hrs daily. Home Safety/Smoke Alarms: Feels safe in home. Smoke alarms in place.  Living environment; residence and Firearm Safety: Lives with mother and acts as mother's caregiver. In handicap apartment.   Female:   Pap- hysterectomy.      Mammo- pt reported 02/27/17-normal      Dexa scan-last 06/12/16 normal        CCS- pt report last about 5 yrs ago-polyps Objective:     Vitals: BP 140/82 (BP Location: Left Arm, Patient Position: Sitting, Cuff Size: Large)   Pulse 72   Ht 5' (1.524 m)   Wt 236 lb 6.4 oz (107.2 kg)   SpO2 96%   BMI 46.17 kg/m   Body mass index is 46.17 kg/m.  Advanced Directives 04/01/2017 12/08/2016 11/10/2016 10/06/2016 01/11/2016 04/11/2014 05/05/2013  Does Patient Have a Medical Advance Directive? No No No No Yes No Patient would like information  Type of Advance Directive - - - - [place];Living will - -  Does patient want to make changes to medical advance directive? - - - - No - Patient declined - -  Copy of [place] in Chart? - - - - No - copy requested - -  Would patient like information on creating a medical advance directive? Yes (MAU/Ambulatory/Procedural Areas - Information given) - - - - - Advance directive packet given  Pre-existing out of  facility DNR order (yellow form or pink MOST form) - - - - - - Yes, notify physician for inpatient order    Tobacco Social History   Tobacco Use  Smoking Status Former Smoker  . Packs/day: 0.12  . Years: 2.00  . Pack years: 0.24  . Types: Cigarettes  Smokeless Tobacco Never Used  Tobacco Comment   08-06-2012 "quit 30 years ago"     Counseling given: Not Answered Comment: 08-06-2012 "quit 30 years ago"   Clinical Intake: Pain : No/denies pain   Past Medical History:  Diagnosis Date  . Anemia   . Arthritis    "plenty" (08-06-12)  . Asthma   . Chronic lower back pain    "they say I need a whole new spinal column" (2012-08-06)  . COPD (chronic obstructive pulmonary disease) ([place])   . Degenerative arthritis    "all over" (08-06-2012)  . GERD (gastroesophageal reflux disease)   . Gout 05/05/2013  . Hypertension   . Migraines    "used to; totally stopped when I quit drinking 30 yr ago" (2012-08-06)  . Myalgia and myositis   . Myocardial infarction [facility]) 1992   2012-08-06 "mild MI when son died "  . Obesity   . Osteoporosis    "all over" (08/06/12)  . Shortness of breath    when stressed.  . Sleep apnea    "never did fix my machine; haven't used one for 5 years; I've lost 116# since then; no problems now" (August 06, 2012)  . Type  II diabetes mellitus ([place])    "borderline; don't test; take Metformin" (08/03/2012)   Past Surgical History:  Procedure Laterality Date  . ABDOMINAL HYSTERECTOMY  1990's  . GANGLION CYST EXCISION Right 1980's   "wrist" (08/03/2012)  . JOINT REPLACEMENT    . KNEE LIGAMENT RECONSTRUCTION Right 1980's  . TONSILLECTOMY  ~ 1954  . TOTAL HIP ARTHROPLASTY Left 08/03/2012  . TOTAL HIP ARTHROPLASTY Left 08/03/2012   Procedure: TOTAL HIP ARTHROPLASTY;  Surgeon: Garald Balding, MD;  Location: [facility];  Service: Orthopedics;  Laterality: Left;  Left Total Hip Arthroplasty  . TOTAL HIP ARTHROPLASTY Left 08/28/2012   Procedure: Irrigation and Debridement hip ;  Surgeon:  Mcarthur Rossetti, MD;  Location: [facility];  Service: Orthopedics;  Laterality: Left;  . TOTAL KNEE ARTHROPLASTY Left 2001  . TOTAL KNEE ARTHROPLASTY Right 2004   Family History  Problem Relation Age of Onset  . Arthritis Mother   . Arthritis Father   . Colon cancer Father   . Diabetes Sister   . Arthritis Sister   . Diabetes Maternal Grandmother   . Arthritis Maternal Grandmother   . Diabetes Maternal Grandfather   . Arthritis Maternal Grandfather    Social History   Socioeconomic History  . Marital status: Widowed    Spouse name: None  . Number of children: 5  . Years of education: PhD  . [facility] education level: None  Social Needs  . Financial resource strain: None  . Food insecurity - worry: None  . Food insecurity - inability: None  . Transportation needs - medical: None  . Transportation needs - non-medical: None  Occupational History  . Occupation: Retired  Tobacco Use  . Smoking status: Former Smoker    Packs/day: 0.12    Years: 2.00    Pack years: 0.24    Types: Cigarettes  . Smokeless tobacco: Never Used  . Tobacco comment: 08/03/2012 "quit 30 years ago"  Substance and Sexual Activity  . Alcohol use: No    Alcohol/week: 0.0 oz    Comment: 08/03/2012 "used to be a beeralcoholic"; stopped ~ 30 yr ago"  . Drug use: No  . Sexual activity: No  Other Topics Concern  . None  Social History Narrative   Lives at home with her mother and caregiver.   Occasional use of caffeine.   Right-handed.    Outpatient Encounter Medications as of 04/01/2017  Medication Sig  . albuterol (PROVENTIL HFA;VENTOLIN HFA) 108 (90 Base) MCG/ACT inhaler Inhale 1-2 puffs into the lungs every 6 (six) hours as needed for wheezing.  Marland Kitchen aspirin EC 81 MG tablet Take 81 mg by mouth daily.  Marland Kitchen atorvastatin (LIPITOR) 10 MG tablet Take 1 tablet (10 mg total) by mouth daily at 6 PM.  . cetirizine (ZYRTEC) 10 MG tablet Take 1 tablet (10 mg total) by mouth daily.  Marland Kitchen lisinopril-hydrochlorothiazide  (ZESTORETIC) 10-12.5 MG tablet Take 1 tablet by mouth daily.  . nortriptyline (PAMELOR) 10 MG capsule Take 2 capsules (20 mg total) by mouth at bedtime. Please call [phone] to schedule appt with Dr. Krista Blue.  . polyethylene glycol powder (GLYCOLAX/MIRALAX) powder Take 17 g by mouth daily.  . pregabalin (LYRICA) 75 MG capsule Take 1 capsule (75 mg total) by mouth 2 (two) times daily.  . Sennosides 8.6 MG CAPS Take by mouth.  . traMADol (ULTRAM) 50 MG tablet Take 1 tablet (50 mg total) by mouth 2 (two) times daily.  . Vitamin D, Ergocalciferol, (DRISDOL) 50000 units CAPS capsule Take 1 capsule (  50,000 Units total) by mouth once a week.  Marland Kitchen allopurinol (ZYLOPRIM) 100 MG tablet Take 1 tablet (100 mg total) by mouth daily. (Patient not taking: Reported on 04/01/2017)   No facility-administered encounter medications on file as of 04/01/2017.     Activities of Daily Living In your present state of health, do you have any difficulty performing the following activities: 04/01/2017  Hearing? N  Comment 1/3 hearing loss in right ear per pt. Declines audiology referral.   Vision? N  Comment Wearing glasses. Dr.Whitiker  Difficulty concentrating or making decisions? N  Walking or climbing stairs? Y  Comment uses cane  Dressing or bathing? N  Doing errands, shopping? Y  Comment Pt no longer driving.  Preparing Food and eating ? N  Using the Toilet? N  In the past six months, have you accidently leaked urine? N  Do you have problems with loss of bowel control? N  Managing your Medications? N  Managing your Finances? N  Housekeeping or managing your Housekeeping? N  Some recent data might be hidden    Patient Care Team: Nche, Charlene Brooke, NP as PCP - General (Internal Medicine) Garald Balding, MD as Consulting Physician (Orthopedic Surgery)    Assessment:   This is a routine wellness examination for Moyers. Physical assessment deferred to PCP.  Exercise Activities and Dietary  recommendations Current Exercise Habits: The patient does not participate in regular exercise at present, Exercise limited by: orthopedic condition(s) Diet (meal preparation, eat out, water intake, caffeinated beverages, dairy products, fruits and vegetables): in general, an "unhealthy" diet, on average, 2 meals per day     Goals    . Eat healthier       Fall Risk Fall Risk  04/01/2017 02/26/2017 02/02/2017 01/05/2017 12/08/2016  Falls in the past year? Yes Yes No No Yes  Comment - - - - last fall September 2018  Number falls in past yr: 2 or more 2 or more - - -  Injury with Fall? Yes Yes - - No  Comment - ankle injury - - -  Risk Factor Category  - - - - -  Risk for fall due to : - - - - Impaired mobility;Impaired balance/gait  Risk for fall due to: Comment - - - - -  Follow up Education provided - - - -    Depression Screen PHQ 2/9 Scores 04/01/2017 02/26/2017 02/02/2017 06/09/2016  PHQ - 2 Score 0 0 0 3  PHQ- 9 Score - - - 11     Cognitive Function MMSE - Mini Mental State Exam 04/01/2017  Orientation to time 5  Orientation to Place 5  Registration 3  Attention/ Calculation 5  Recall 1  Language- name 2 objects 2  Language- repeat 1  Language- follow 3 step command 3  Language- read & follow direction 1  Write a sentence 1  Copy design 1  Total score 28        Immunization History  Administered Date(s) Administered  . Pneumococcal Conjugate-13 06/02/2016    Screening Tests Health Maintenance  Topic Date Due  . OPHTHALMOLOGY EXAM  [birth year]06/1955  . MAMMOGRAM  11/02/2015  . TETANUS/TDAP  05/01/2017 (Originally 06/07/1966)  . FOOT EXAM  06/02/2017  . PNA vac Low Risk Adult (2 of 2 - PPSV23) 06/02/2017  . HEMOGLOBIN A1C  08/28/2017  . COLONOSCOPY  01/01/2022  . DEXA SCAN  Completed  . Hepatitis C Screening  Completed      Plan:   Follow up  with [name], NP 08/27/17 as scheduled.  Continue to eat heart healthy diet (full of fruits, vegetables, whole grains, lean  protein, water--limit salt, fat, and sugar intake) and increase physical activity as tolerated.  Continue doing brain stimulating activities (puzzles, reading, adult coloring books, staying active) to keep memory sharp.   Bring a copy of your living will and/or healthcare power of attorney to your next office visit.   I have personally reviewed and noted the following in the patient's chart:   . Medical and social history . Use of alcohol, tobacco or illicit drugs  . Current medications and supplements . Functional ability and status . Nutritional status . Physical activity . Advanced directives . List of other physicians . Hospitalizations, surgeries, and ER visits in previous 12 months . Vitals . Screenings to include cognitive, depression, and falls . Referrals and appointments  In addition, I have reviewed and discussed with patient certain preventive protocols, quality metrics, and best practice recommendations. A written personalized care plan for preventive services as well as general preventive health recommendations were provided to patient.     Shela Nevin, [place]  04/01/2017

## 2017-04-01 ENCOUNTER — Ambulatory Visit (INDEPENDENT_AMBULATORY_CARE_PROVIDER_SITE_OTHER): Payer: Medicare Other | Admitting: Behavioral Health

## 2017-04-01 ENCOUNTER — Encounter: Payer: Self-pay | Admitting: Behavioral Health

## 2017-04-01 VITALS — BP 140/82 | HR 72 | Ht 60.0 in | Wt 236.4 lb

## 2017-04-01 DIAGNOSIS — Z0000 Encounter for general adult medical examination without abnormal findings: Secondary | ICD-10-CM

## 2017-04-01 NOTE — Progress Notes (Signed)
Medical screening examination/treatment/procedure(s) were performed by the Wellness Coach, RN. As primary care provider I was immediately available for consulation/collaboration. I agree with above documentation. Maurisa Tesmer, AGNP-C 

## 2017-04-01 NOTE — Patient Instructions (Signed)
Follow up with [name], NP 08/27/17 as scheduled.  Continue to eat heart healthy diet (full of fruits, vegetables, whole grains, lean protein, water--limit salt, fat, and sugar intake) and increase physical activity as tolerated.  Continue doing brain stimulating activities (puzzles, reading, adult coloring books, staying active) to keep memory sharp.   Bring a copy of your living will and/or healthcare power of attorney to your next office visit.   Sandra Brennan , Thank you for taking time to come for your Medicare Wellness Visit. I appreciate your ongoing commitment to your health goals. Please review the following plan we discussed and let me know if I can assist you in the future.   These are the goals we discussed: Goals    . Eat healthier       This is a list of the screening recommended for you and due dates:  Health Maintenance  Topic Date Due  . Eye exam for diabetics  06/06/1955  . Mammogram  11/02/2015  . Tetanus Vaccine  05/01/2017*  . Complete foot exam   06/02/2017  . Pneumonia vaccines (2 of 2 - PPSV23) 06/02/2017  . Hemoglobin A1C  08/28/2017  . Colon Cancer Screening  01/01/2022  . DEXA scan (bone density measurement)  Completed  .  Hepatitis C: One time screening is recommended by Center for Disease Control  (CDC) for  adults born from [birth year] through 1963.   Completed  *Topic was postponed. The date shown is not the original due date.    Health Maintenance for Postmenopausal Women Menopause is a normal process in which your reproductive ability comes to an end. This process happens gradually over a span of months to years, usually between the ages of 79 and 46. Menopause is complete when you have missed 12 consecutive menstrual periods. It is important to talk with your health care provider about some of the most common conditions that affect postmenopausal women, such as heart disease, cancer, and bone loss (osteoporosis). Adopting a healthy lifestyle and getting  preventive care can help to promote your health and wellness. Those actions can also lower your chances of developing some of these common conditions. What should I know about menopause? During menopause, you may experience a number of symptoms, such as:  Moderate-to-severe hot flashes.  Night sweats.  Decrease in sex drive.  Mood swings.  Headaches.  Tiredness.  Irritability.  Memory problems.  Insomnia.  Choosing to treat or not to treat menopausal changes is an individual decision that you make with your health care provider. What should I know about hormone replacement therapy and supplements? Hormone therapy products are effective for treating symptoms that are associated with menopause, such as hot flashes and night sweats. Hormone replacement carries certain risks, especially as you become older. If you are thinking about using estrogen or estrogen with progestin treatments, discuss the benefits and risks with your health care provider. What should I know about heart disease and stroke? Heart disease, heart attack, and stroke become more likely as you age. This may be due, in part, to the hormonal changes that your body experiences during menopause. These can affect how your body processes dietary fats, triglycerides, and cholesterol. Heart attack and stroke are both medical emergencies. There are many things that you can do to help prevent heart disease and stroke:  Have your blood pressure checked at least every 1-2 years. High blood pressure causes heart disease and increases the risk of stroke.  If you are 44-33 years old, ask  your health care provider if you should take aspirin to prevent a heart attack or a stroke.  Do not use any tobacco products, including cigarettes, chewing tobacco, or electronic cigarettes. If you need help quitting, ask your health care provider.  It is important to eat a healthy diet and maintain a healthy weight. ? Be sure to include plenty of  vegetables, fruits, low-fat dairy products, and lean protein. ? Avoid eating foods that are high in solid fats, added sugars, or salt (sodium).  Get regular exercise. This is one of the most important things that you can do for your health. ? Try to exercise for at least 150 minutes each week. The type of exercise that you do should increase your heart rate and make you sweat. This is known as moderate-intensity exercise. ? Try to do strengthening exercises at least twice each week. Do these in addition to the moderate-intensity exercise.  Know your numbers.Ask your health care provider to check your cholesterol and your blood glucose. Continue to have your blood tested as directed by your health care provider.  What should I know about cancer screening? There are several types of cancer. Take the following steps to reduce your risk and to catch any cancer development as early as possible. Breast Cancer  Practice breast self-awareness. ? This means understanding how your breasts normally appear and feel. ? It also means doing regular breast self-exams. Let your health care provider know about any changes, no matter how small.  If you are 34 or older, have a clinician do a breast exam (clinical breast exam or CBE) every year. Depending on your age, family history, and medical history, it may be recommended that you also have a yearly breast X-ray (mammogram).  If you have a family history of breast cancer, talk with your health care provider about genetic screening.  If you are at high risk for breast cancer, talk with your health care provider about having an MRI and a mammogram every year.  Breast cancer (BRCA) gene test is recommended for women who have family members with BRCA-related cancers. Results of the assessment will determine the need for genetic counseling and BRCA1 and for BRCA2 testing. BRCA-related cancers include these types: ? Breast. This occurs in males or  females. ? Ovarian. ? Tubal. This may also be called fallopian tube cancer. ? Cancer of the abdominal or pelvic lining (peritoneal cancer). ? Prostate. ? Pancreatic.  Cervical, Uterine, and Ovarian Cancer Your health care provider may recommend that you be screened regularly for cancer of the pelvic organs. These include your ovaries, uterus, and vagina. This screening involves a pelvic exam, which includes checking for microscopic changes to the surface of your cervix (Pap test).  For women ages 21-65, health care providers may recommend a pelvic exam and a Pap test every three years. For women ages 10-65, they may recommend the Pap test and pelvic exam, combined with testing for human papilloma virus (HPV), every five years. Some types of HPV increase your risk of cervical cancer. Testing for HPV may also be done on women of any age who have unclear Pap test results.  Other health care providers may not recommend any screening for nonpregnant women who are considered low risk for pelvic cancer and have no symptoms. Ask your health care provider if a screening pelvic exam is right for you.  If you have had past treatment for cervical cancer or a condition that could lead to cancer, you need Pap tests  and screening for cancer for at least 20 years after your treatment. If Pap tests have been discontinued for you, your risk factors (such as having a new sexual partner) need to be reassessed to determine if you should start having screenings again. Some women have medical problems that increase the chance of getting cervical cancer. In these cases, your health care provider may recommend that you have screening and Pap tests more often.  If you have a family history of uterine cancer or ovarian cancer, talk with your health care provider about genetic screening.  If you have vaginal bleeding after reaching menopause, tell your health care provider.  There are currently no reliable tests available  to screen for ovarian cancer.  Lung Cancer Lung cancer screening is recommended for adults 17-70 years old who are at high risk for lung cancer because of a history of smoking. A yearly low-dose CT scan of the lungs is recommended if you:  Currently smoke.  Have a history of at least 30 pack-years of smoking and you currently smoke or have quit within the past 15 years. A pack-year is smoking an average of one pack of cigarettes per day for one year.  Yearly screening should:  Continue until it has been 15 years since you quit.  Stop if you develop a health problem that would prevent you from having lung cancer treatment.  Colorectal Cancer  This type of cancer can be detected and can often be prevented.  Routine colorectal cancer screening usually begins at age 46 and continues through age 64.  If you have risk factors for colon cancer, your health care provider may recommend that you be screened at an earlier age.  If you have a family history of colorectal cancer, talk with your health care provider about genetic screening.  Your health care provider may also recommend using home test kits to check for hidden blood in your stool.  A small camera at the end of a tube can be used to examine your colon directly (sigmoidoscopy or colonoscopy). This is done to check for the earliest forms of colorectal cancer.  Direct examination of the colon should be repeated every 5-10 years until age 69. However, if early forms of precancerous polyps or small growths are found or if you have a family history or genetic risk for colorectal cancer, you may need to be screened more often.  Skin Cancer  Check your skin from head to toe regularly.  Monitor any moles. Be sure to tell your health care provider: ? About any new moles or changes in moles, especially if there is a change in a mole's shape or color. ? If you have a mole that is larger than the size of a pencil eraser.  If any of your  family members has a history of skin cancer, especially at a young age, talk with your health care provider about genetic screening.  Always use sunscreen. Apply sunscreen liberally and repeatedly throughout the day.  Whenever you are outside, protect yourself by wearing long sleeves, pants, a wide-brimmed hat, and sunglasses.  What should I know about osteoporosis? Osteoporosis is a condition in which bone destruction happens more quickly than new bone creation. After menopause, you may be at an increased risk for osteoporosis. To help prevent osteoporosis or the bone fractures that can happen because of osteoporosis, the following is recommended:  If you are 90-61 years old, get at least 1,000 mg of calcium and at least 600 mg of  vitamin D per day.  If you are older than age 52 but younger than age 16, get at least 1,200 mg of calcium and at least 600 mg of vitamin D per day.  If you are older than age 60, get at least 1,200 mg of calcium and at least 800 mg of vitamin D per day.  Smoking and excessive alcohol intake increase the risk of osteoporosis. Eat foods that are rich in calcium and vitamin D, and do weight-bearing exercises several times each week as directed by your health care provider. What should I know about how menopause affects my mental health? Depression may occur at any age, but it is more common as you become older. Common symptoms of depression include:  Low or sad mood.  Changes in sleep patterns.  Changes in appetite or eating patterns.  Feeling an overall lack of motivation or enjoyment of activities that you previously enjoyed.  Frequent crying spells.  Talk with your health care provider if you think that you are experiencing depression. What should I know about immunizations? It is important that you get and maintain your immunizations. These include:  Tetanus, diphtheria, and pertussis (Tdap) booster vaccine.  Influenza every year before the flu season  begins.  Pneumonia vaccine.  Shingles vaccine.  Your health care provider may also recommend other immunizations. This information is not intended to replace advice given to you by your health care provider. Make sure you discuss any questions you have with your health care provider. Document Released: 04/11/2005 Document Revised: 09/07/2015 Document Reviewed: 11/21/2014 Elsevier Interactive Patient Education  2018 [employer].

## 2017-05-03 ENCOUNTER — Emergency Department (HOSPITAL_COMMUNITY): Payer: Medicare Other

## 2017-05-03 ENCOUNTER — Emergency Department (HOSPITAL_COMMUNITY)
Admission: EM | Admit: 2017-05-03 | Discharge: 2017-05-03 | Disposition: A | Discharge status: Home / Self Care | Payer: Medicare Other | Attending: Emergency Medicine | Admitting: Emergency Medicine

## 2017-05-03 ENCOUNTER — Other Ambulatory Visit: Payer: Self-pay

## 2017-05-03 ENCOUNTER — Encounter (HOSPITAL_COMMUNITY): Payer: Self-pay

## 2017-05-03 DIAGNOSIS — J040 Acute laryngitis: Secondary | ICD-10-CM | POA: Diagnosis not present

## 2017-05-03 DIAGNOSIS — Z96642 Presence of left artificial hip joint: Secondary | ICD-10-CM | POA: Diagnosis not present

## 2017-05-03 DIAGNOSIS — Z87891 Personal history of nicotine dependence: Secondary | ICD-10-CM | POA: Diagnosis not present

## 2017-05-03 DIAGNOSIS — J45909 Unspecified asthma, uncomplicated: Secondary | ICD-10-CM | POA: Diagnosis not present

## 2017-05-03 DIAGNOSIS — I10 Essential (primary) hypertension: Secondary | ICD-10-CM | POA: Insufficient documentation

## 2017-05-03 DIAGNOSIS — Z79899 Other long term (current) drug therapy: Secondary | ICD-10-CM | POA: Diagnosis not present

## 2017-05-03 DIAGNOSIS — E1142 Type 2 diabetes mellitus with diabetic polyneuropathy: Secondary | ICD-10-CM | POA: Diagnosis not present

## 2017-05-03 DIAGNOSIS — K219 Gastro-esophageal reflux disease without esophagitis: Secondary | ICD-10-CM

## 2017-05-03 DIAGNOSIS — Z96653 Presence of artificial knee joint, bilateral: Secondary | ICD-10-CM | POA: Diagnosis not present

## 2017-05-03 DIAGNOSIS — Z7982 Long term (current) use of aspirin: Secondary | ICD-10-CM | POA: Insufficient documentation

## 2017-05-03 DIAGNOSIS — K2100 Gastro-esophageal reflux disease with esophagitis, without bleeding: Secondary | ICD-10-CM

## 2017-05-03 DIAGNOSIS — J449 Chronic obstructive pulmonary disease, unspecified: Secondary | ICD-10-CM | POA: Diagnosis not present

## 2017-05-03 DIAGNOSIS — J029 Acute pharyngitis, unspecified: Secondary | ICD-10-CM | POA: Diagnosis present

## 2017-05-03 LAB — BASIC METABOLIC PANEL
Anion gap: 12 (ref 5–15)
BUN: 14 mg/dL (ref 6–20)
CALCIUM: 9.4 mg/dL (ref 8.9–10.3)
CO2: 22 mmol/L (ref 22–32)
Chloride: 104 mmol/L (ref 101–111)
Creatinine, Ser: 1.22 mg/dL — ABNORMAL HIGH (ref 0.44–1.00)
GFR, EST AFRICAN AMERICAN: 51 mL/min — AB (ref >60)
GFR, EST NON AFRICAN AMERICAN: 44 mL/min — AB (ref >60)
Glucose, Bld: 81 mg/dL (ref 65–99)
Potassium: 3.8 mmol/L (ref 3.5–5.1)
Sodium: 138 mmol/L (ref 135–145)

## 2017-05-03 LAB — CBC
HCT: 34.7 % — ABNORMAL LOW (ref 36.0–46.0)
Hemoglobin: 11.4 g/dL — ABNORMAL LOW (ref 12.0–15.0)
MCH: 30.2 pg (ref 26.0–34.0)
MCHC: 32.9 g/dL (ref 30.0–36.0)
MCV: 92 fL (ref 78.0–100.0)
Platelets: 219 10*3/uL (ref 150–400)
RBC: 3.77 MIL/uL — AB (ref 3.87–5.11)
RDW: 13.5 % (ref 11.5–15.5)
WBC: 6.4 10*3/uL (ref 4.0–10.5)

## 2017-05-03 LAB — I-STAT TROPONIN, ED: Troponin i, poc: 0 ng/mL (ref 0.00–0.08)

## 2017-05-03 LAB — RAPID STREP SCREEN (MED CTR MEBANE ONLY): Streptococcus, Group A Screen (Direct): NEGATIVE

## 2017-05-03 MED ORDER — RANITIDINE HCL 150 MG PO TABS: 150.0000 mg | ORAL_TABLET | Freq: Two times a day (BID) | ORAL | 0 refills | Status: DC

## 2017-05-03 MED ORDER — METHYLPREDNISOLONE SODIUM SUCC 125 MG IJ SOLR
125.0000 mg | Freq: Once | INTRAMUSCULAR | Status: AC
  Administered 2017-05-03: 125 mg via INTRAVENOUS
  Filled 2017-05-03: qty 2

## 2017-05-03 MED ORDER — MIDAZOLAM HCL 2 MG/2ML IJ SOLN
4.0000 mg | Freq: Once | INTRAMUSCULAR | Status: DC
  Filled 2017-05-03: qty 4

## 2017-05-03 MED ORDER — MIDAZOLAM HCL 2 MG/2ML IJ SOLN
INTRAMUSCULAR | Status: AC | PRN
  Administered 2017-05-03 (×2): 2 mg via INTRAVENOUS

## 2017-05-03 MED ORDER — OXYMETAZOLINE HCL 0.05 % NA SOLN
1.0000 | Freq: Once | NASAL | Status: AC
  Administered 2017-05-03: 1 via NASAL
  Filled 2017-05-03: qty 15

## 2017-05-03 MED ORDER — SODIUM CHLORIDE 0.9 % IV SOLN: 1.0000 g | Freq: Once | INTRAVENOUS | Status: DC

## 2017-05-03 MED ORDER — LIDOCAINE HCL (PF) 1 % IJ SOLN
5.0000 mL | Freq: Once | INTRAMUSCULAR | Status: AC
  Administered 2017-05-03: 5 mL
  Filled 2017-05-03: qty 5

## 2017-05-03 MED ORDER — CLINDAMYCIN PHOSPHATE 900 MG/50ML IV SOLN
900.0000 mg | Freq: Once | INTRAVENOUS | Status: DC
  Administered 2017-05-03: 900 mg via INTRAVENOUS
  Filled 2017-05-03: qty 50

## 2017-05-03 NOTE — ED Provider Notes (Addendum)
[facility] EMERGENCY DEPARTMENT Provider Note   CSN: [id number] Arrival date & time: 05/03/17  1228     History   Chief Complaint Chief Complaint  Patient presents with  . Sore Throat    felt like she was choking  . Chest Pain    HPI Sandra Brennan is a 72 y.o. female.  HPI  72 yo female complaining of throat pain began this am while at church- felt fb sensation but was not eating,.  Feels coarse and she is hoarse,  No fever, or chills.   Patietn also state she has reflux worse past two days and vomited x 1 each day.  nbnb- food only.  Describes reflux as feels like stamach acid in mouth.  Sometimes vomits.    Past Medical History:  Diagnosis Date  . Anemia   . Arthritis    "plenty" (12-Aug-2012)  . Asthma   . Chronic lower back pain    "they say I need a whole new spinal column" (2012-08-12)  . COPD (chronic obstructive pulmonary disease) ([place])   . Degenerative arthritis    "all over" (August 12, 2012)  . GERD (gastroesophageal reflux disease)   . Gout 05/05/2013  . Hypertension   . Migraines    "used to; totally stopped when I quit drinking 30 yr ago" (Aug 12, 2012)  . Myalgia and myositis   . Myocardial infarction [facility]) 1992   2012-08-12 "mild MI when son died "  . Obesity   . Osteoporosis    "all over" (12-Aug-2012)  . Shortness of breath    when stressed.  . Sleep apnea    "never did fix my machine; haven't used one for 5 years; I've lost 116# since then; no problems now" (12-Aug-2012)  . Type II diabetes mellitus ([place])    "borderline; don't test; take Metformin" (Aug 12, 2012)    Patient Active Problem List   Diagnosis Date Noted  . Post-traumatic osteoarthritis of right ankle 06/09/2016  . Spondylosis without myelopathy or radiculopathy, lumbar region 06/09/2016  . Cervical spinal stenosis 06/09/2016  . Spinal stenosis, lumbar region, without neurogenic claudication 06/09/2016  . Chronic gouty arthritis 06/04/2016  . Vitamin D deficiency 06/02/2016  .  Hyperlipidemia 06/02/2016  . Closed right ankle fracture 05/01/2016  . Colon polyps 05/01/2016  . Fibromyalgia syndrome 05/01/2016  . Syncope 03/30/2016  . Chest pain 03/30/2016  . Fall 03/30/2016  . Obesity 05/06/2013  . Acute nontraumatic kidney injury ([place]) 05/05/2013  . Acute posthemorrhagic anemia 09/06/2012  . Gout 09/05/2012  . Osteoarthritis of left hip 08/05/2012  . Type 2 diabetes mellitus with diabetic polyneuropathy, without long-term current use of insulin ([place]) 08/05/2012  . Hypertension 08/05/2012  . Asthma, chronic 08/05/2012  . Sleep apnea 08/05/2012    Past Surgical History:  Procedure Laterality Date  . ABDOMINAL HYSTERECTOMY  1990's  . GANGLION CYST EXCISION Right 1980's   "wrist" (2012-08-12)  . JOINT REPLACEMENT    . KNEE LIGAMENT RECONSTRUCTION Right 1980's  . TONSILLECTOMY  ~ 1954  . TOTAL HIP ARTHROPLASTY Left August 12, 2012  . TOTAL HIP ARTHROPLASTY Left Aug 12, 2012   Procedure: TOTAL HIP ARTHROPLASTY;  Surgeon: Garald Balding, MD;  Location: [facility];  Service: Orthopedics;  Laterality: Left;  Left Total Hip Arthroplasty  . TOTAL HIP ARTHROPLASTY Left 08/28/2012   Procedure: Irrigation and Debridement hip ;  Surgeon: Mcarthur Rossetti, MD;  Location: [facility];  Service: Orthopedics;  Laterality: Left;  . TOTAL KNEE ARTHROPLASTY Left 2001  . TOTAL KNEE ARTHROPLASTY Right 2004  OB History    No data available       Home Medications    Prior to Admission medications   Medication Sig Start Date End Date Taking? Authorizing Provider  albuterol (PROVENTIL HFA;VENTOLIN HFA) 108 (90 Base) MCG/ACT inhaler Inhale 1-2 puffs into the lungs every 6 (six) hours as needed for wheezing. 02/26/17   Nche, Charlene Brooke, NP  allopurinol (ZYLOPRIM) 100 MG tablet Take 1 tablet (100 mg total) by mouth daily. Patient not taking: Reported on 04/01/2017 02/27/17   Flossie Buffy, NP  aspirin EC 81 MG tablet Take 81 mg by mouth daily.    [provider]    atorvastatin (LIPITOR) 10 MG tablet Take 1 tablet (10 mg total) by mouth daily at 6 PM. 02/27/17   Nche, Charlene Brooke, NP  cetirizine (ZYRTEC) 10 MG tablet Take 1 tablet (10 mg total) by mouth daily. 06/02/16   Nche, Charlene Brooke, NP  lisinopril-hydrochlorothiazide (ZESTORETIC) 10-12.5 MG tablet Take 1 tablet by mouth daily. 02/27/17   Nche, Charlene Brooke, NP  nortriptyline (PAMELOR) 10 MG capsule Take 2 capsules (20 mg total) by mouth at bedtime. Please call [phone] to schedule appt with Dr. Krista Blue. 12/01/16   Marcial Pacas, MD  polyethylene glycol powder (GLYCOLAX/MIRALAX) powder Take 17 g by mouth daily. 02/26/17   Nche, Charlene Brooke, NP  pregabalin (LYRICA) 75 MG capsule Take 1 capsule (75 mg total) by mouth 2 (two) times daily. 02/02/17   Kirsteins, Luanna Salk, MD  Sennosides 8.6 MG CAPS Take by mouth.    [provider]  traMADol (ULTRAM) 50 MG tablet Take 1 tablet (50 mg total) by mouth 2 (two) times daily. 02/02/17   Kirsteins, Luanna Salk, MD  Vitamin D, Ergocalciferol, (DRISDOL) 50000 units CAPS capsule Take 1 capsule (50,000 Units total) by mouth once a week. 06/04/16   Nche, Charlene Brooke, NP    Family History Family History  Problem Relation Age of Onset  . Arthritis Mother   . Arthritis Father   . Colon cancer Father   . Diabetes Sister   . Arthritis Sister   . Diabetes Maternal Grandmother   . Arthritis Maternal Grandmother   . Diabetes Maternal Grandfather   . Arthritis Maternal Grandfather     Social History Social History   Tobacco Use  . Smoking status: Former Smoker    Packs/day: 0.12    Years: 2.00    Pack years: 0.24    Types: Cigarettes  . Smokeless tobacco: Never Used  . Tobacco comment: 08/03/2012 "quit 30 years ago"  Substance Use Topics  . Alcohol use: No    Alcohol/week: 0.0 oz    Comment: 08/03/2012 "used to be a beeralcoholic"; stopped ~ 30 yr ago"  . Drug use: No     Allergies   Cymbalta [duloxetine hcl]; Influenza vaccines; Penicillins; Sulfa  antibiotics; and Theophyllines   Review of Systems Review of Systems   Physical Exam Updated Vital Signs BP 129/68   Pulse 72   Temp 98.7 F (37.1 C) (Oral)   Resp 16   SpO2 100%   Physical Exam  Constitutional: She appears well-developed and well-nourished. She does not appear ill.  HENT:  Head: Normocephalic and atraumatic.  Right Ear: Tympanic membrane and ear canal normal.  Left Ear: Tympanic membrane and ear canal normal.  Mouth/Throat: Uvula is midline, oropharynx is clear and moist and mucous membranes are normal.  Eyes: Pupils are equal, round, and reactive to light. EOM are normal.  Neck: Normal range of  motion. Neck supple.  Cardiovascular: Normal rate and regular rhythm.  Pulmonary/Chest: Effort normal and breath sounds normal.  Abdominal: Soft. Bowel sounds are normal.  Neurological: She is alert.  Skin: Skin is warm and dry. Capillary refill takes less than 2 seconds.  Psychiatric: She has a normal mood and affect.  Nursing note and vitals reviewed.    ED Treatments / Results  Labs (all labs ordered are listed, but only abnormal results are displayed) Labs Reviewed  BASIC METABOLIC PANEL - Abnormal; Notable for the following components:      Result Value   Creatinine, Ser 1.22 (*)    GFR calc non Af Amer 44 (*)    GFR calc Af Amer 51 (*)    All other components within normal limits  CBC - Abnormal; Notable for the following components:   RBC 3.77 (*)    Hemoglobin 11.4 (*)    HCT 34.7 (*)    All other components within normal limits  I-STAT TROPONIN, ED    EKG  EKG Interpretation  Date/Time:  Sunday May 03 2017 12:36:02 EST Ventricular Rate:  85 PR Interval:  150 QRS Duration: 98 QT Interval:  372 QTC Calculation: 442 R Axis:   20 Text Interpretation:  Normal sinus rhythm Normal ECG Confirmed by Pattricia Boss [phone]) on 05/03/2017 3:43:16 PM       Radiology Dg Chest 2 View  Result Date: 05/03/2017 CLINICAL DATA:  Chest pain. EXAM:  CHEST  2 VIEW COMPARISON:  Radiographs of March 30, 2016. FINDINGS: The heart size and mediastinal contours are within normal limits. Both lungs are clear. No pneumothorax or pleural effusion is noted. The visualized skeletal structures are unremarkable. IMPRESSION: No active cardiopulmonary disease. Electronically Signed   By: Marijo Conception, M.D.   On: 05/03/2017 14:15    Procedures Fiberoptic laryngoscopy Date/Time: 05/03/2017 4:25 PM Performed by: Pattricia Boss, MD Authorized by: Pattricia Boss, MD  Consent: Verbal consent obtained. Written consent obtained. Risks and benefits: risks, benefits and alternatives were discussed Consent given by: patient Patient understanding: patient states understanding of the procedure being performed Patient consent: the patient's understanding of the procedure matches consent given Procedure consent: procedure consent matches procedure scheduled Relevant documents: relevant documents present and verified Time out: Immediately prior to procedure a "time out" was called to verify the correct patient, procedure, equipment, support staff and site/side marked as required. Local anesthesia used: yes  Anesthesia: Local anesthesia used: yes Anesthetic total (ml): lidocain nebulized and afrin.  Sedation: Patient sedated: yes Sedation type: anxiolysis Sedation: versed. Sedation start date/time: 05/03/2017 4:00 PM Sedation end date/time: 05/03/2017 4:27 PM Vitals: Vital signs were monitored during sedation.  Patient tolerance: Patient tolerated the procedure well with no immediate complications Comments: Oropharynx without fb seen or redness or swelling- epiglottis with some mild edema noted, no erythema    (including critical care time)  Medications Ordered in ED Medications - No data to display   Initial Impression / Assessment and Plan / ED Course  I have reviewed the triage vital signs and the nursing notes.  Pertinent labs & imaging results that  were available during my care of the patient were reviewed by me and considered in my medical decision making (see chart for details).     This a 72 year old female with a history of reflux who has had some increased reflux over the past several days.  Today she noted some pain in her throat and some change in her voice.  Here she has  some mild edema of her epiglottis.  Strep is negative.  I discussed the patient's care with Dr. Wilburn Cornelia, on-call for ENT.  He feels this is most consistent with her reflux.  I am discussing the reflux management with the patient.  He is currently taking Tums only and I will add ranitidine 75 mg p.o. twice daily.  She can follow-up with Dr. Wilburn Cornelia for recheck of her throat.  I am advising her to follow-up with her primary care physician for management of her reflux.  She did have appears stable here for discharge.  We have discussed return precautions and need for follow-up and she voices understanding.   Final Clinical Impressions(s) / ED Diagnoses   Final diagnoses:  Gastroesophageal reflux disease with esophagitis  Reflux laryngitis    ED Discharge Orders        Ordered    ranitidine (ZANTAC) 150 MG tablet  2 times daily     05/03/17 1715       Pattricia Boss, MD 05/03/17 1718    Pattricia Boss, MD 05/28/17 1451

## 2017-05-03 NOTE — ED Notes (Signed)
Pt fully recoverdd from the sedation vital good  Alert oreinted  C/o a sl headache and being cold.   Warm blankets given

## 2017-05-03 NOTE — ED Notes (Signed)
p 76 sl irregular

## 2017-05-03 NOTE — ED Notes (Signed)
Pr drowsy but akakens immdeiately when questions asked . oriented

## 2017-05-03 NOTE — ED Notes (Signed)
Please note: UA collected at 13:48. Placed in lab work tray in [place]. Awaiting order.

## 2017-05-03 NOTE — ED Triage Notes (Signed)
Pt states she was singing at church this morning and started to feel like "something was pulling" and something was stuck in her throat. Pt reports some chest discomfort at that time.

## 2017-05-03 NOTE — Discharge Instructions (Signed)
Continue tums.  Take ranitidine as prescribed. Return if you have any difficulty breathing or swallowing. Call Dr. Wilburn Cornelia for follow up tomorrow.

## 2017-05-06 LAB — CULTURE, GROUP A STREP (THRC)

## 2017-05-27 ENCOUNTER — Other Ambulatory Visit: Payer: Self-pay | Admitting: Neurology

## 2017-07-10 ENCOUNTER — Other Ambulatory Visit: Payer: Self-pay | Admitting: Neurology

## 2017-07-17 ENCOUNTER — Other Ambulatory Visit: Payer: Self-pay | Admitting: Neurology

## 2017-07-24 ENCOUNTER — Other Ambulatory Visit: Payer: Self-pay | Admitting: Neurology

## 2017-07-31 ENCOUNTER — Other Ambulatory Visit: Payer: Self-pay | Admitting: Neurology

## 2017-08-03 ENCOUNTER — Ambulatory Visit: Payer: Medicare Other | Admitting: Registered Nurse

## 2017-08-04 ENCOUNTER — Ambulatory Visit: Payer: Medicare Other | Admitting: Registered Nurse

## 2017-08-05 ENCOUNTER — Encounter: Payer: Self-pay | Admitting: Registered Nurse

## 2017-08-05 ENCOUNTER — Encounter: Payer: 59 | Attending: Registered Nurse | Admitting: Registered Nurse

## 2017-08-05 VITALS — BP 111/75 | HR 72 | Resp 14 | Ht 61.0 in | Wt 242.0 lb

## 2017-08-05 DIAGNOSIS — Z8261 Family history of arthritis: Secondary | ICD-10-CM | POA: Diagnosis not present

## 2017-08-05 DIAGNOSIS — Z96653 Presence of artificial knee joint, bilateral: Secondary | ICD-10-CM | POA: Insufficient documentation

## 2017-08-05 DIAGNOSIS — Z90710 Acquired absence of both cervix and uterus: Secondary | ICD-10-CM | POA: Insufficient documentation

## 2017-08-05 DIAGNOSIS — M48061 Spinal stenosis, lumbar region without neurogenic claudication: Secondary | ICD-10-CM

## 2017-08-05 DIAGNOSIS — Z800 Family history of malignant neoplasm of digestive organs: Secondary | ICD-10-CM | POA: Insufficient documentation

## 2017-08-05 DIAGNOSIS — K219 Gastro-esophageal reflux disease without esophagitis: Secondary | ICD-10-CM | POA: Insufficient documentation

## 2017-08-05 DIAGNOSIS — I10 Essential (primary) hypertension: Secondary | ICD-10-CM | POA: Insufficient documentation

## 2017-08-05 DIAGNOSIS — M109 Gout, unspecified: Secondary | ICD-10-CM | POA: Insufficient documentation

## 2017-08-05 DIAGNOSIS — J449 Chronic obstructive pulmonary disease, unspecified: Secondary | ICD-10-CM | POA: Diagnosis not present

## 2017-08-05 DIAGNOSIS — E669 Obesity, unspecified: Secondary | ICD-10-CM | POA: Diagnosis not present

## 2017-08-05 DIAGNOSIS — Z79899 Other long term (current) drug therapy: Secondary | ICD-10-CM | POA: Diagnosis not present

## 2017-08-05 DIAGNOSIS — I252 Old myocardial infarction: Secondary | ICD-10-CM | POA: Insufficient documentation

## 2017-08-05 DIAGNOSIS — G8929 Other chronic pain: Secondary | ICD-10-CM | POA: Insufficient documentation

## 2017-08-05 DIAGNOSIS — E119 Type 2 diabetes mellitus without complications: Secondary | ICD-10-CM | POA: Insufficient documentation

## 2017-08-05 DIAGNOSIS — Z5181 Encounter for therapeutic drug level monitoring: Secondary | ICD-10-CM

## 2017-08-05 DIAGNOSIS — Z96642 Presence of left artificial hip joint: Secondary | ICD-10-CM | POA: Insufficient documentation

## 2017-08-05 DIAGNOSIS — Z833 Family history of diabetes mellitus: Secondary | ICD-10-CM | POA: Insufficient documentation

## 2017-08-05 DIAGNOSIS — G894 Chronic pain syndrome: Secondary | ICD-10-CM

## 2017-08-05 DIAGNOSIS — M19071 Primary osteoarthritis, right ankle and foot: Secondary | ICD-10-CM | POA: Insufficient documentation

## 2017-08-05 DIAGNOSIS — M47816 Spondylosis without myelopathy or radiculopathy, lumbar region: Secondary | ICD-10-CM

## 2017-08-05 DIAGNOSIS — Z6841 Body Mass Index (BMI) 40.0 and over, adult: Secondary | ICD-10-CM | POA: Insufficient documentation

## 2017-08-05 DIAGNOSIS — M797 Fibromyalgia: Secondary | ICD-10-CM | POA: Diagnosis not present

## 2017-08-05 DIAGNOSIS — Z87891 Personal history of nicotine dependence: Secondary | ICD-10-CM | POA: Insufficient documentation

## 2017-08-05 DIAGNOSIS — Z760 Encounter for issue of repeat prescription: Secondary | ICD-10-CM | POA: Diagnosis not present

## 2017-08-05 DIAGNOSIS — M19171 Post-traumatic osteoarthritis, right ankle and foot: Secondary | ICD-10-CM | POA: Diagnosis not present

## 2017-08-05 MED ORDER — PREGABALIN 75 MG PO CAPS: 75.0000 mg | ORAL_CAPSULE | Freq: Two times a day (BID) | ORAL | 5 refills | Status: DC

## 2017-08-05 MED ORDER — TRAMADOL HCL 50 MG PO TABS: 50.0000 mg | ORAL_TABLET | Freq: Two times a day (BID) | ORAL | 5 refills | Status: DC

## 2017-08-05 NOTE — Progress Notes (Signed)
Subjective:    Patient ID: Sandra Brennan, female    DOB: 1945-03-18, 72 y.o.   MRN: [id number]  HPI: [name] is a 72 year old female who returns for follow up appointment for chronic pain and medication refill. She states her pain is located in her lower back and right ankle. She rates her pain 7. Her current exercise regime is walking.   [name] Morphine Equivalent is 10.00 MME.    Pain Inventory Average Pain 7 Pain Right Now 7 My pain is sharp, stabbing and aching  In the last 24 hours, has pain interfered with the following? General activity 7 Relation with others 7 Enjoyment of life 7 What TIME of day is your pain at its worst? night Sleep (in general) Fair  Pain is worse with: walking, bending, sitting and standing Pain improves with: rest and medication Relief from Meds: 9  Mobility walk with assistance use a cane how many minutes can you walk? 10 ability to climb steps?  yes do you drive?  no Do you have any goals in this area?  yes  Function retired Do you have any goals in this area?  yes  Neuro/Psych bladder control problems bowel control problems numbness tremor trouble walking dizziness  Prior Studies Any changes since last visit?  no  Physicians involved in your care Any changes since last visit?  no   Family History  Problem Relation Age of Onset  . Arthritis Mother   . Arthritis Father   . Colon cancer Father   . Diabetes Sister   . Arthritis Sister   . Diabetes Maternal Grandmother   . Arthritis Maternal Grandmother   . Diabetes Maternal Grandfather   . Arthritis Maternal Grandfather    Social History   Socioeconomic History  . Marital status: Widowed    Spouse name: Not on file  . Number of children: 5  . Years of education: PhD  . Highest education level: Not on file  Occupational History  . Occupation: Retired  Scientific laboratory technician  . Financial resource strain: Not on file  . Food insecurity:    Worry: Not on  file    Inability: Not on file  . Transportation needs:    Medical: Not on file    Non-medical: Not on file  Tobacco Use  . Smoking status: Former Smoker    Packs/day: 0.12    Years: 2.00    Pack years: 0.24    Types: Cigarettes  . Smokeless tobacco: Never Used  . Tobacco comment: 08/03/2012 "quit 30 years ago"  Substance and Sexual Activity  . Alcohol use: No    Alcohol/week: 0.0 oz    Comment: 08/03/2012 "used to be a beeralcoholic"; stopped ~ 30 yr ago"  . Drug use: No  . Sexual activity: Never  Lifestyle  . Physical activity:    Days per week: Not on file    Minutes per session: Not on file  . Stress: Not on file  Relationships  . Social connections:    Talks on phone: Not on file    Gets together: Not on file    Attends religious service: Not on file    Active member of club or organization: Not on file    Attends meetings of clubs or organizations: Not on file    Relationship status: Not on file  Other Topics Concern  . Not on file  Social History Narrative   Lives at home with her mother and caregiver.  Occasional use of caffeine.   Right-handed.   Past Surgical History:  Procedure Laterality Date  . ABDOMINAL HYSTERECTOMY  1990's  . GANGLION CYST EXCISION Right 1980's   "wrist" (2012/08/20)  . JOINT REPLACEMENT    . KNEE LIGAMENT RECONSTRUCTION Right 1980's  . TONSILLECTOMY  ~ 1954  . TOTAL HIP ARTHROPLASTY Left Aug 20, 2012  . TOTAL HIP ARTHROPLASTY Left 08-20-12   Procedure: TOTAL HIP ARTHROPLASTY;  Surgeon: Garald Balding, MD;  Location: [facility];  Service: Orthopedics;  Laterality: Left;  Left Total Hip Arthroplasty  . TOTAL HIP ARTHROPLASTY Left 08/28/2012   Procedure: Irrigation and Debridement hip ;  Surgeon: Mcarthur Rossetti, MD;  Location: [place];  Service: Orthopedics;  Laterality: Left;  . TOTAL KNEE ARTHROPLASTY Left 2001  . TOTAL KNEE ARTHROPLASTY Right 2004   Past Medical History:  Diagnosis Date  . Anemia   . Arthritis    "plenty"  (Aug 20, 2012)  . Asthma   . Chronic lower back pain    "they say I need a whole new spinal column" (August 20, 2012)  . COPD (chronic obstructive pulmonary disease) ([place])   . Degenerative arthritis    "all over" (08-20-12)  . GERD (gastroesophageal reflux disease)   . Gout 05/05/2013  . Hypertension   . Migraines    "used to; totally stopped when I quit drinking 30 yr ago" (08-20-2012)  . Myalgia and myositis   . Myocardial infarction [facility]) 1992   2012/08/20 "mild MI when son died "  . Obesity   . Osteoporosis    "all over" (2012-08-20)  . Shortness of breath    when stressed.  . Sleep apnea    "never did fix my machine; haven't used one for 5 years; I've lost 116# since then; no problems now" (08/20/2012)  . Type II diabetes mellitus ([place])    "borderline; don't test; take Metformin" (08-20-12)   BP 111/75 (BP Location: Right Arm, Patient Position: Sitting, Cuff Size: Large)   Pulse 72   Resp 14   Ht 5\' 1"  (1.549 m)   Wt 242 lb (109.8 kg)   SpO2 96%   BMI 45.73 kg/m   Opioid Risk Score:   Fall Risk Score:  `1  Depression screen PHQ 2/9  Depression screen [facility] 2/9 04/01/2017 02/26/2017 02/02/2017 06/09/2016 05/01/2016  Decreased Interest 0 0 0 3 0  Down, Depressed, Hopeless 0 0 0 0 0  PHQ - 2 Score 0 0 0 3 0  Altered sleeping - - - 2 -  Tired, decreased energy - - - 3 -  Change in appetite - - - 3 -  Feeling bad or failure about yourself  - - - 0 -  Trouble concentrating - - - 0 -  Moving slowly or fidgety/restless - - - 0 -  Suicidal thoughts - - - 0 -  PHQ-9 Score - - - 11 -  Difficult doing work/chores - - - Somewhat difficult -    Review of Systems  Constitutional: Negative.   HENT: Negative.   Eyes: Negative.   Respiratory: Negative.   Cardiovascular: Negative.   Gastrointestinal: Negative.   Endocrine: Negative.   Genitourinary: Positive for difficulty urinating and dysuria.  Musculoskeletal: Positive for arthralgias, back pain, gait problem and neck pain.  Skin: Positive  for rash.  Allergic/Immunologic: Negative.   Neurological: Positive for dizziness, tremors and numbness.  Hematological: Negative.   Psychiatric/Behavioral: Negative.        Objective:   Physical Exam  Constitutional: She is oriented to  person, place, and time. She appears well-developed and well-nourished.  HENT:  Head: Normocephalic and atraumatic.  Neck: Normal range of motion. Neck supple.  Cardiovascular: Normal rate and regular rhythm.  Pulmonary/Chest: Effort normal and breath sounds normal.  Musculoskeletal:  Normal Muscle Bulk and Muscle Testing Reveals: Upper Extremities: Decreased ROM 90 Degrees and Muscle Strength 5/5 Lumbar Paraspinal Tenderness: L-4-L-5 Lower Extremities: Full ROM and Muscle Strength 5/5 Arises from Table slowly using cane for support Antalgic gait  Neurological: She is alert and oriented to person, place, and time.  Skin: Skin is warm and dry.  Psychiatric: She has a normal mood and affect.  Nursing note and vitals reviewed.         Assessment & Plan:  1. Lumbar Spondylosis without Myelopathy: S/P Right Radio Frequency Neurotomy. Only minimal Relief. Continue Lyrica. Continue to Monitor.  2. Right Ankle Osteoarthritis: Continue Tramadol. Continue to monitor.  3. Fibromylagia Syndrome: Continue Lyrica. Continue to Monitor.   20 Minutes of face to face patient care time visit. All questions were encouraged and answered.   F/U in 6 months with Dr. Letta Pate

## 2017-08-07 ENCOUNTER — Other Ambulatory Visit: Payer: Self-pay | Admitting: Neurology

## 2017-08-14 ENCOUNTER — Other Ambulatory Visit: Payer: Self-pay | Admitting: Neurology

## 2017-08-21 ENCOUNTER — Other Ambulatory Visit: Payer: Self-pay | Admitting: Neurology

## 2017-08-27 ENCOUNTER — Ambulatory Visit: Payer: Medicare Other | Admitting: Nurse Practitioner

## 2017-08-28 ENCOUNTER — Other Ambulatory Visit: Payer: Self-pay | Admitting: Neurology

## 2017-08-31 ENCOUNTER — Encounter: Payer: Self-pay | Admitting: Nurse Practitioner

## 2017-08-31 ENCOUNTER — Ambulatory Visit (INDEPENDENT_AMBULATORY_CARE_PROVIDER_SITE_OTHER): Payer: 59 | Admitting: Nurse Practitioner

## 2017-08-31 VITALS — BP 122/72 | HR 76 | Temp 97.6°F | Ht 61.0 in | Wt 248.0 lb

## 2017-08-31 DIAGNOSIS — E1142 Type 2 diabetes mellitus with diabetic polyneuropathy: Secondary | ICD-10-CM | POA: Diagnosis not present

## 2017-08-31 DIAGNOSIS — M1A00X0 Idiopathic chronic gout, unspecified site, without tophus (tophi): Secondary | ICD-10-CM

## 2017-08-31 DIAGNOSIS — J45909 Unspecified asthma, uncomplicated: Secondary | ICD-10-CM

## 2017-08-31 DIAGNOSIS — J302 Other seasonal allergic rhinitis: Secondary | ICD-10-CM

## 2017-08-31 DIAGNOSIS — I10 Essential (primary) hypertension: Secondary | ICD-10-CM

## 2017-08-31 DIAGNOSIS — M1A9XX0 Chronic gout, unspecified, without tophus (tophi): Secondary | ICD-10-CM | POA: Diagnosis not present

## 2017-08-31 DIAGNOSIS — K219 Gastro-esophageal reflux disease without esophagitis: Secondary | ICD-10-CM | POA: Diagnosis not present

## 2017-08-31 DIAGNOSIS — R490 Dysphonia: Secondary | ICD-10-CM

## 2017-08-31 DIAGNOSIS — Z23 Encounter for immunization: Secondary | ICD-10-CM

## 2017-08-31 DIAGNOSIS — E559 Vitamin D deficiency, unspecified: Secondary | ICD-10-CM

## 2017-08-31 DIAGNOSIS — E782 Mixed hyperlipidemia: Secondary | ICD-10-CM | POA: Diagnosis not present

## 2017-08-31 LAB — HEMOGLOBIN A1C: HEMOGLOBIN A1C: 5.9 % (ref 4.6–6.5)

## 2017-08-31 LAB — LIPID PANEL
CHOL/HDL RATIO: 2
Cholesterol: 132 mg/dL (ref 0–200)
HDL: 68.9 mg/dL (ref >39.00)
LDL Cholesterol: 54 mg/dL (ref 0–99)
NONHDL: 62.71
Triglycerides: 42 mg/dL (ref 0.0–149.0)
VLDL: 8.4 mg/dL (ref 0.0–40.0)

## 2017-08-31 LAB — MICROALBUMIN / CREATININE URINE RATIO
Creatinine,U: 38.2 mg/dL
MICROALB/CREAT RATIO: 1.8 mg/g (ref 0.0–30.0)
Microalb, Ur: <0.7 mg/dL (ref 0.0–1.9)

## 2017-08-31 LAB — COMPREHENSIVE METABOLIC PANEL
ALT: 10 U/L (ref 0–35)
AST: 19 U/L (ref 0–37)
Albumin: 3.6 g/dL (ref 3.5–5.2)
Alkaline Phosphatase: 93 U/L (ref 39–117)
BILIRUBIN TOTAL: 0.5 mg/dL (ref 0.2–1.2)
BUN: 16 mg/dL (ref 6–23)
CO2: 26 mEq/L (ref 19–32)
Calcium: 9.2 mg/dL (ref 8.4–10.5)
Chloride: 106 mEq/L (ref 96–112)
Creatinine, Ser: 1.14 mg/dL (ref 0.40–1.20)
GFR: 60.56 mL/min (ref >60.00)
GLUCOSE: 74 mg/dL (ref 70–99)
Potassium: 4 mEq/L (ref 3.5–5.1)
SODIUM: 139 meq/L (ref 135–145)
TOTAL PROTEIN: 6.8 g/dL (ref 6.0–8.3)

## 2017-08-31 MED ORDER — RANITIDINE HCL 150 MG PO TABS: 150.0000 mg | ORAL_TABLET | Freq: Two times a day (BID) | ORAL | 0 refills | Status: DC

## 2017-08-31 MED ORDER — CALCIUM CARBONATE 600 MG PO TABS: 600.0000 mg | ORAL_TABLET | Freq: Two times a day (BID) | ORAL | Status: DC

## 2017-08-31 MED ORDER — VITAMIN D 1000 UNITS PO TABS: 1000.0000 [IU] | ORAL_TABLET | Freq: Every day | ORAL | Status: DC

## 2017-08-31 MED ORDER — ALBUTEROL SULFATE HFA 108 (90 BASE) MCG/ACT IN AERS: 1.0000 | INHALATION_SPRAY | Freq: Four times a day (QID) | RESPIRATORY_TRACT | 5 refills | Status: DC | PRN

## 2017-08-31 MED ORDER — CETIRIZINE HCL 5 MG PO TABS
5.0000 mg | ORAL_TABLET | Freq: Every day | ORAL | Status: DC
Start: 2017-08-31 — End: 2018-07-12

## 2017-08-31 NOTE — Patient Instructions (Addendum)
Please have [facility] faxed recent mammogram report.  Schedule ophthalmology appt.  Stable lab results.  Consider referral to ENT and/or change in lisinopril/HCTZ??

## 2017-08-31 NOTE — Progress Notes (Signed)
Subjective:  Patient ID: Sandra Brennan, female    DOB: [birth year]  Age: 72 y.o. MRN: [id number]  CC: Follow-up (6 mo fu DM and HTN, hyperlipidemia/ fasting/swallow prolem--hasder to swallow/migrains/vit D and inhaler refill)  Sore Throat   This is a recurrent problem. The current episode started more than 1 month ago. The problem has been waxing and waning. There has been no fever. Associated symptoms include a hoarse voice and trouble swallowing. Pertinent negatives include no abdominal pain, congestion, coughing, diarrhea, drooling, ear discharge, ear pain, headaches, plugged ear sensation, neck pain, shortness of breath, stridor, swollen glands or vomiting. She has had no exposure to strep or mono. She has tried NSAIDs, acetaminophen and gargles for the symptoms. The treatment provided mild relief.  describes as tight and dry. some improvement with use of ranitidine BID. No improvement with use of moisture chamber with CPAP Chokes on solid, needs to take small bites. Has hoarseness in voice. No tobacco use, no EOTH use  Asthma: Controlled with Use of albuterol prn. Use 3x/week. Triggered with pollen.  HTN: Controlled with lisinopril/HCTZ. BP Readings from Last 3 Encounters:  08/31/17 122/72  08/05/17 111/75  05/03/17 134/72   DM: Does not check glucose at home. Diet controlled at the time.  Gout: Stable uric acid. No exacerbation in last 1year. Use of allopurinol once a day.  Reviewed [place] today.  Outpatient Medications Prior to Visit  Medication Sig Dispense Refill  . aspirin EC 81 MG tablet Take 81 mg by mouth daily.    . cycloSPORINE (RESTASIS) 0.05 % ophthalmic emulsion Restasis 0.05 % eye drops in a dropperette    . nortriptyline (PAMELOR) 10 MG capsule Take 2 capsules at bedtime. 180 capsule 1  . polyethylene glycol powder (GLYCOLAX/MIRALAX) powder Take 17 g by mouth daily. 850 g 3  . pregabalin (LYRICA) 75 MG capsule Take 1 capsule (75 mg total) by mouth 2 (two)  times daily. 60 capsule 5  . Sennosides 8.6 MG CAPS Take by mouth.    . traMADol (ULTRAM) 50 MG tablet Take 1 tablet (50 mg total) by mouth 2 (two) times daily. 60 tablet 5  . albuterol (PROVENTIL HFA;VENTOLIN HFA) 108 (90 Base) MCG/ACT inhaler Inhale 1-2 puffs into the lungs every 6 (six) hours as needed for wheezing. 1 Inhaler 5  . allopurinol (ZYLOPRIM) 100 MG tablet Take 1 tablet (100 mg total) by mouth daily. 90 tablet 3  . atorvastatin (LIPITOR) 10 MG tablet Take 1 tablet (10 mg total) by mouth daily at 6 PM. 90 tablet 3  . cetirizine (ZYRTEC) 10 MG tablet Take 1 tablet (10 mg total) by mouth daily. 30 tablet 0  . lisinopril-hydrochlorothiazide (ZESTORETIC) 10-12.5 MG tablet Take 1 tablet by mouth daily. 90 tablet 3  . ranitidine (ZANTAC) 150 MG tablet Take 1 tablet (150 mg total) by mouth 2 (two) times daily. 60 tablet 0  . Vitamin D, Ergocalciferol, (DRISDOL) 50000 units CAPS capsule Take 1 capsule (50,000 Units total) by mouth once a week. 12 capsule 1   No facility-administered medications prior to visit.     ROS See HPI  Objective:  BP 122/72   Pulse 76   Temp 97.6 F (36.4 C) (Oral)   Ht 5\' 1"  (1.549 m)   Wt 248 lb (112.5 kg)   SpO2 99%   BMI 46.86 kg/m   BP Readings from Last 3 Encounters:  08/31/17 122/72  08/05/17 111/75  05/03/17 134/72    Wt Readings from Last 3 Encounters:  08/31/17 248 lb (112.5 kg)  08/05/17 242 lb (109.8 kg)  04/01/17 236 lb 6.4 oz (107.2 kg)    Physical Exam  Constitutional: She is oriented to person, place, and time. No distress.  HENT:  Mouth/Throat: Uvula is midline, oropharynx is clear and moist and mucous membranes are normal.  Neck: Normal range of motion. Neck supple. No thyromegaly present.  Cardiovascular: Normal rate, regular rhythm and normal heart sounds.  Pulmonary/Chest: Effort normal and breath sounds normal.  Musculoskeletal: She exhibits edema. She exhibits no tenderness.  Lymphadenopathy:    She has no cervical  adenopathy.  Neurological: She is alert and oriented to person, place, and time.  Skin: Skin is warm and dry. No erythema.  Psychiatric: She has a normal mood and affect. Her behavior is normal. Thought content normal.  Vitals reviewed.   Lab Results  Component Value Date   WBC 6.4 05/03/2017   HGB 11.4 (L) 05/03/2017   HCT 34.7 (L) 05/03/2017   PLT 219 05/03/2017   GLUCOSE 74 08/31/2017   CHOL 132 08/31/2017   TRIG 42.0 08/31/2017   HDL 68.90 08/31/2017   LDLCALC 54 08/31/2017   ALT 10 08/31/2017   AST 19 08/31/2017   NA 139 08/31/2017   K 4.0 08/31/2017   CL 106 08/31/2017   CREATININE 1.14 08/31/2017   BUN 16 08/31/2017   CO2 26 08/31/2017   TSH 2.80 02/26/2017   INR 1.17 08/27/2012   HGBA1C 5.9 08/31/2017   MICROALBUR <0.7 08/31/2017    Dg Chest 2 View  Result Date: 05/03/2017 CLINICAL DATA:  Chest pain. EXAM: CHEST  2 VIEW COMPARISON:  Radiographs of March 30, 2016. FINDINGS: The heart size and mediastinal contours are within normal limits. Both lungs are clear. No pneumothorax or pleural effusion is noted. The visualized skeletal structures are unremarkable. IMPRESSION: No active cardiopulmonary disease. Electronically Signed   By: Marijo Conception, M.D.   On: 05/03/2017 14:15    Assessment & Plan:   Sanayah was seen today for follow-up.  Diagnoses and all orders for this visit:  Essential hypertension -     Comprehensive metabolic panel -     Microalbumin / creatinine urine ratio  Type 2 diabetes mellitus with diabetic polyneuropathy, without long-term current use of insulin (HCC) -     Hemoglobin A1c -     Comprehensive metabolic panel  Mixed hyperlipidemia -     Lipid panel -     atorvastatin (LIPITOR) 10 MG tablet; Take 1 tablet (10 mg total) by mouth daily at 6 PM.  Vitamin D insufficiency -     cholecalciferol (VITAMIN D) 1000 units tablet; Take 1 tablet (1,000 Units total) by mouth daily. -     calcium carbonate (CALCIUM 600) 600 MG TABS tablet;  Take 1 tablet (600 mg total) by mouth 2 (two) times daily with a meal.  Chronic asthma without complication, unspecified asthma severity, unspecified whether persistent -     albuterol (PROVENTIL HFA;VENTOLIN HFA) 108 (90 Base) MCG/ACT inhaler; Inhale 1-2 puffs into the lungs every 6 (six) hours as needed for wheezing.  Hoarseness  Gastroesophageal reflux disease, esophagitis presence not specified -     Discontinue: ranitidine (ZANTAC) 150 MG tablet; Take 1 tablet (150 mg total) by mouth 2 (two) times daily. -     ranitidine (ZANTAC) 150 MG tablet; Take 1 tablet (150 mg total) by mouth 2 (two) times daily.  Chronic seasonal allergic rhinitis -     cetirizine (ZYRTEC) 5 MG tablet;  Take 1 tablet (5 mg total) by mouth daily.  Need for pneumococcal vaccination -     Pneumococcal polysaccharide vaccine 23-valent greater than or equal to 2yo subcutaneous/IM  Hypertension, unspecified type -     lisinopril-hydrochlorothiazide (ZESTORETIC) 10-12.5 MG tablet; Take 1 tablet by mouth daily.  Chronic gouty arthritis -     allopurinol (ZYLOPRIM) 100 MG tablet; Take 1 tablet (100 mg total) by mouth daily.  Chronic gout involving toe of right foot without tophus, unspecified cause -     allopurinol (ZYLOPRIM) 100 MG tablet; Take 1 tablet (100 mg total) by mouth daily.   I have discontinued [name]. [place]ver[place]'s Vitamin D (Ergocalciferol). I have also changed her cetirizine. Additionally, I am having her start on cholecalciferol and calcium carbonate. Lastly, I am having her maintain her aspirin EC, Sennosides, polyethylene glycol powder, nortriptyline, pregabalin, traMADol, cycloSPORINE, albuterol, ranitidine, atorvastatin, lisinopril-hydrochlorothiazide, and allopurinol.  Meds ordered this encounter  Medications  . cholecalciferol (VITAMIN D) 1000 units tablet    Sig: Take 1 tablet (1,000 Units total) by mouth daily.    Order Specific Question:   Supervising Provider    Answer:   Lucille Passy  [[id number]]  . calcium carbonate (CALCIUM 600) 600 MG TABS tablet    Sig: Take 1 tablet (600 mg total) by mouth 2 (two) times daily with a meal.    Dispense:  60 tablet    Order Specific Question:   Supervising Provider    Answer:   Lucille Passy [[id number]]  . DISCONTD: ranitidine (ZANTAC) 150 MG tablet    Sig: Take 1 tablet (150 mg total) by mouth 2 (two) times daily.    Dispense:  60 tablet    Refill:  0    Order Specific Question:   Supervising Provider    Answer:   Lucille Passy [[id number]]  . albuterol (PROVENTIL HFA;VENTOLIN HFA) 108 (90 Base) MCG/ACT inhaler    Sig: Inhale 1-2 puffs into the lungs every 6 (six) hours as needed for wheezing.    Dispense:  1 Inhaler    Refill:  5    Order Specific Question:   Supervising Provider    Answer:   Lucille Passy [[id number]]  . cetirizine (ZYRTEC) 5 MG tablet    Sig: Take 1 tablet (5 mg total) by mouth daily.    Order Specific Question:   Supervising Provider    Answer:   Lucille Passy [[id number]]  . ranitidine (ZANTAC) 150 MG tablet    Sig: Take 1 tablet (150 mg total) by mouth 2 (two) times daily.    Dispense:  60 tablet    Refill:  0    Order Specific Question:   Supervising Provider    Answer:   Lucille Passy [[id number]]  . atorvastatin (LIPITOR) 10 MG tablet    Sig: Take 1 tablet (10 mg total) by mouth daily at 6 PM.    Dispense:  90 tablet    Refill:  3    Dispense as insurance required.    Order Specific Question:   Supervising Provider    Answer:   Lucille Passy [[id number]]  . lisinopril-hydrochlorothiazide (ZESTORETIC) 10-12.5 MG tablet    Sig: Take 1 tablet by mouth daily.    Dispense:  90 tablet    Refill:  3    Order Specific Question:   Supervising Provider    Answer:   Lucille Passy [[id number]]  . allopurinol (ZYLOPRIM) 100 MG tablet    Sig: Take  1 tablet (100 mg total) by mouth daily.    Dispense:  90 tablet    Refill:  3    Order Specific Question:   Supervising Provider    Answer:   Lucille Passy [[id number]]    Follow-up: Return in about 1 month  (around 09/28/2017) for hoarseness and GERD.  Wilfred Lacy, NP

## 2017-09-01 ENCOUNTER — Encounter: Payer: Self-pay | Admitting: Nurse Practitioner

## 2017-09-01 MED ORDER — ATORVASTATIN CALCIUM 10 MG PO TABS: 10.0000 mg | ORAL_TABLET | Freq: Every day | ORAL | 3 refills | Status: DC

## 2017-09-01 MED ORDER — ALLOPURINOL 100 MG PO TABS: 100.0000 mg | ORAL_TABLET | Freq: Every day | ORAL | 3 refills | Status: DC

## 2017-09-01 MED ORDER — LISINOPRIL-HYDROCHLOROTHIAZIDE 10-12.5 MG PO TABS: 1.0000 | ORAL_TABLET | Freq: Every day | ORAL | 3 refills | Status: DC

## 2017-09-04 ENCOUNTER — Other Ambulatory Visit: Payer: Self-pay | Admitting: Neurology

## 2017-09-10 ENCOUNTER — Ambulatory Visit (INDEPENDENT_AMBULATORY_CARE_PROVIDER_SITE_OTHER): Payer: 59 | Admitting: Adult Health

## 2017-09-10 ENCOUNTER — Encounter: Payer: Self-pay | Admitting: Adult Health

## 2017-09-10 VITALS — BP 132/75 | HR 81 | Ht 61.0 in | Wt 250.0 lb

## 2017-09-10 DIAGNOSIS — G4733 Obstructive sleep apnea (adult) (pediatric): Secondary | ICD-10-CM | POA: Diagnosis not present

## 2017-09-10 DIAGNOSIS — Z9989 Dependence on other enabling machines and devices: Secondary | ICD-10-CM | POA: Diagnosis not present

## 2017-09-10 DIAGNOSIS — G43019 Migraine without aura, intractable, without status migrainosus: Secondary | ICD-10-CM | POA: Diagnosis not present

## 2017-09-10 DIAGNOSIS — G43009 Migraine without aura, not intractable, without status migrainosus: Secondary | ICD-10-CM | POA: Insufficient documentation

## 2017-09-10 MED ORDER — NORTRIPTYLINE HCL 10 MG PO CAPS: 30.0000 mg | ORAL_CAPSULE | Freq: Every day | ORAL | 3 refills | Status: DC

## 2017-09-10 NOTE — Progress Notes (Addendum)
PATIENT: Sandra Brennan DOB: [birth year]  REASON FOR VISIT: follow up HISTORY FROM: patient  HISTORY OF PRESENT ILLNESS: Today 09/10/17:  Sandra Brennan is a 72 year old female with a history of obstructive sleep apnea on CPAP and migraine headaches.  She returns today for follow-up.  Her CPAP download indicates that she use her machine nightly for compliance of 100%.  She use her machine greater than 4 hours each night.  On average she uses her machine 7 hours and 15 minutes.  Her residual AHI is 0.5 on 6 cm of water with EPR of 1.  She does not have a significant leak.  She reports that the CPAP continues to work well for her.  She has been seeing Dr. Krista Blue for migraine headaches.  She states that nortriptyline was working well but recently she has been having a daily headache.  She states that she does keep a 38-year-old during the day as well.  She reports that with her headaches she does have photophobia and phonophobia but denies nausea and vomiting.  She returns today for an evaluation.  HISTORY 09/09/2016 (Dr. Rexene Alberts): I reviewed her CPAP compliance data from 08/08/2016 through 09/06/2016, which is a total of 30 days, during which time she used her CPAP every night with percent used days greater than 4 hours at 100%, indicating superb compliance with an average usage of 7 hours and 30 minutes, residual AHI low at 0.3 per hour, leak on the high side with the 95th percentile at 23.3 L/m on a pressure of 6 cm. She reports doing okay, sleeping well. Mother lives with her, age 54. Compared to 09/10/15 she is 20 lb less, but had lost 30 lb.  Fell in 4/18. Has been on Lyrica and tramadol for pain, followed by pain management   REVIEW OF SYSTEMS: Out of a complete 14 system review of symptoms, the patient complains only of the following symptoms, and all other reviewed systems are negative.  See HPI  ALLERGIES: Allergies  Allergen Reactions  . Cymbalta [Duloxetine Hcl] Anaphylaxis  . Pineapple  Shortness Of Breath  . Influenza Vaccines Hives and Itching  . Penicillins Hives  . Sulfa Antibiotics Other (See Comments)    REACTION: unknown  . Theophyllines Hives    HOME MEDICATIONS: Outpatient Medications Prior to Visit  Medication Sig Dispense Refill  . albuterol (PROVENTIL HFA;VENTOLIN HFA) 108 (90 Base) MCG/ACT inhaler Inhale 1-2 puffs into the lungs every 6 (six) hours as needed for wheezing. 1 Inhaler 5  . allopurinol (ZYLOPRIM) 100 MG tablet Take 1 tablet (100 mg total) by mouth daily. 90 tablet 3  . aspirin EC 81 MG tablet Take 81 mg by mouth daily.    Marland Kitchen atorvastatin (LIPITOR) 10 MG tablet Take 1 tablet (10 mg total) by mouth daily at 6 PM. 90 tablet 3  . cetirizine (ZYRTEC) 5 MG tablet Take 1 tablet (5 mg total) by mouth daily.    . cholecalciferol (VITAMIN D) 1000 units tablet Take 1 tablet (1,000 Units total) by mouth daily. (Patient taking differently: Take 1,000 Units by mouth 2 (two) times daily. )    . cycloSPORINE (RESTASIS) 0.05 % ophthalmic emulsion Restasis 0.05 % eye drops in a dropperette    . lisinopril-hydrochlorothiazide (ZESTORETIC) 10-12.5 MG tablet Take 1 tablet by mouth daily. 90 tablet 3  . nortriptyline (PAMELOR) 10 MG capsule Take 2 capsules at bedtime. 180 capsule 1  . polyethylene glycol powder (GLYCOLAX/MIRALAX) powder Take 17 g by mouth daily. 850 g 3  .  pregabalin (LYRICA) 75 MG capsule Take 1 capsule (75 mg total) by mouth 2 (two) times daily. 60 capsule 5  . ranitidine (ZANTAC) 150 MG tablet Take 1 tablet (150 mg total) by mouth 2 (two) times daily. 60 tablet 0  . Sennosides 8.6 MG CAPS Take by mouth.    . traMADol (ULTRAM) 50 MG tablet Take 1 tablet (50 mg total) by mouth 2 (two) times daily. 60 tablet 5  . calcium carbonate (CALCIUM 600) 600 MG TABS tablet Take 1 tablet (600 mg total) by mouth 2 (two) times daily with a meal. (Patient not taking: Reported on 09/10/2017) 60 tablet    No facility-administered medications prior to visit.     PAST  MEDICAL HISTORY: Past Medical History:  Diagnosis Date  . Anemia   . Arthritis    "plenty" (2012-08-27)  . Asthma   . Chronic lower back pain    "they say I need a whole new spinal column" (08-27-2012)  . COPD (chronic obstructive pulmonary disease) ([place])   . Degenerative arthritis    "all over" (Aug 27, 2012)  . GERD (gastroesophageal reflux disease)   . Gout 05/05/2013  . Hypertension   . Migraines    "used to; totally stopped when I quit drinking 30 yr ago" (Aug 27, 2012)  . Myalgia and myositis   . Myocardial infarction [facility]) 1992   08/27/2012 "mild MI when son died "  . Obesity   . Osteoporosis    "all over" (08/27/2012)  . Shortness of breath    when stressed.  . Sleep apnea    "never did fix my machine; haven't used one for 5 years; I've lost 116# since then; no problems now" (27-Aug-2012)  . Type II diabetes mellitus ([place])    "borderline; don't test; take Metformin" (August 27, 2012)    PAST SURGICAL HISTORY: Past Surgical History:  Procedure Laterality Date  . ABDOMINAL HYSTERECTOMY  1990's  . GANGLION CYST EXCISION Right 1980's   "wrist" (08/27/2012)  . JOINT REPLACEMENT    . KNEE LIGAMENT RECONSTRUCTION Right 1980's  . TONSILLECTOMY  ~ 1954  . TOTAL HIP ARTHROPLASTY Left 2012-08-27  . TOTAL HIP ARTHROPLASTY Left 08-27-12   Procedure: TOTAL HIP ARTHROPLASTY;  Surgeon: Garald Balding, MD;  Location: [place];  Service: Orthopedics;  Laterality: Left;  Left Total Hip Arthroplasty  . TOTAL HIP ARTHROPLASTY Left 08/28/2012   Procedure: Irrigation and Debridement hip ;  Surgeon: Mcarthur Rossetti, MD;  Location: [place];  Service: Orthopedics;  Laterality: Left;  . TOTAL KNEE ARTHROPLASTY Left 2001  . TOTAL KNEE ARTHROPLASTY Right 2004    FAMILY HISTORY: Family History  Problem Relation Age of Onset  . Arthritis Mother   . Arthritis Father   . Colon cancer Father   . Diabetes Sister   . Arthritis Sister   . Diabetes Maternal Grandmother   . Arthritis Maternal Grandmother   .  Diabetes Maternal Grandfather   . Arthritis Maternal Grandfather     SOCIAL HISTORY: Social History   Socioeconomic History  . Marital status: Widowed    Spouse name: Not on file  . Number of children: 5  . Years of education: PhD  . Highest education level: Not on file  Occupational History  . Occupation: Retired  Scientific laboratory technician  . Financial resource strain: Not on file  . Food insecurity:    Worry: Not on file    Inability: Not on file  . Transportation needs:    Medical: Not on file    Non-medical: Not  on file  Tobacco Use  . Smoking status: Former Smoker    Packs/day: 0.12    Years: 2.00    Pack years: 0.24    Types: Cigarettes  . Smokeless tobacco: Never Used  . Tobacco comment: 08/03/2012 "quit 30 years ago"  Substance and Sexual Activity  . Alcohol use: No    Alcohol/week: 0.0 oz    Comment: 08/03/2012 "used to be a beeralcoholic"; stopped ~ 30 yr ago"  . Drug use: No  . Sexual activity: Never  Lifestyle  . Physical activity:    Days per week: Not on file    Minutes per session: Not on file  . Stress: Not on file  Relationships  . Social connections:    Talks on phone: Not on file    Gets together: Not on file    Attends religious service: Not on file    Active member of club or organization: Not on file    Attends meetings of clubs or organizations: Not on file    Relationship status: Not on file  . Intimate partner violence:    Fear of current or ex partner: Not on file    Emotionally abused: Not on file    Physically abused: Not on file    Forced sexual activity: Not on file  Other Topics Concern  . Not on file  Social History Narrative   Lives at home with her mother and caregiver.   Occasional use of caffeine.   Right-handed.      PHYSICAL EXAM  Vitals:   09/10/17 1424  BP: 132/75  Pulse: 81  Weight: 250 lb (113.4 kg)  Height: 5\' 1"  (1.549 m)   Body mass index is 47.24 kg/m.  Generalized: Well developed, in no acute distress    Neurological examination  Mentation: Alert oriented to time, place, history taking. Follows all commands speech and language fluent Cranial nerve II-XII: Pupils were equal round reactive to light. Extraocular movements were full, visual field were full on confrontational test. Facial sensation and strength were normal. Uvula tongue midline. Head turning and shoulder shrug  were normal and symmetric. Motor: The motor testing reveals 5 over 5 strength of all 4 extremities. Good symmetric motor tone is noted throughout.  Sensory: Sensory testing is intact to soft touch on all 4 extremities. No evidence of extinction is noted.  Coordination: Cerebellar testing reveals good finger-nose-finger and heel-to-shin bilaterally.  Gait and station: Gait is normal.  Reflexes: Deep tendon reflexes are symmetric and normal bilaterally.   DIAGNOSTIC DATA (LABS, IMAGING, TESTING) - I reviewed patient records, labs, notes, testing and imaging myself where available.  Lab Results  Component Value Date   WBC 6.4 05/03/2017   HGB 11.4 (L) 05/03/2017   HCT 34.7 (L) 05/03/2017   MCV 92.0 05/03/2017   PLT 219 05/03/2017      Component Value Date/Time   NA 139 08/31/2017 1108   NA 142 07/14/2014 1014   K 4.0 08/31/2017 1108   CL 106 08/31/2017 1108   CO2 26 08/31/2017 1108   GLUCOSE 74 08/31/2017 1108   BUN 16 08/31/2017 1108   BUN 12 07/14/2014 1014   CREATININE 1.14 08/31/2017 1108   CALCIUM 9.2 08/31/2017 1108   PROT 6.8 08/31/2017 1108   PROT 6.9 07/14/2014 1014   ALBUMIN 3.6 08/31/2017 1108   ALBUMIN 4.3 07/14/2014 1014   AST 19 08/31/2017 1108   ALT 10 08/31/2017 1108   ALKPHOS 93 08/31/2017 1108   BILITOT 0.5 08/31/2017 1108  BILITOT 0.2 07/14/2014 1014   GFRNONAA 44 (L) 05/03/2017 1238   GFRAA 51 (L) 05/03/2017 1238   Lab Results  Component Value Date   CHOL 132 08/31/2017   HDL 68.90 08/31/2017   LDLCALC 54 08/31/2017   TRIG 42.0 08/31/2017   CHOLHDL 2 08/31/2017   Lab Results   Component Value Date   HGBA1C 5.9 08/31/2017   Lab Results  Component Value Date   VITAMINB12 307 07/14/2014   Lab Results  Component Value Date   TSH 2.80 02/26/2017      ASSESSMENT AND PLAN 72 y.o. year old female  has a past medical history of Anemia, Arthritis, Asthma, Chronic lower back pain, COPD (chronic obstructive pulmonary disease) ([place]), Degenerative arthritis, GERD (gastroesophageal reflux disease), Gout (05/05/2013), Hypertension, Migraines, Myalgia and myositis, Myocardial infarction ([place]) (1992), Obesity, Osteoporosis, Shortness of breath, Sleep apnea, and Type II diabetes mellitus ([place]). here with :  1.  Obstructive sleep apnea on CPAP 2. Migraine headaches  The patient CPAP download shows excellent compliance and good treatment of her apnea.  The patient's headaches have increased in frequency.  I advised that she can increase nortriptyline to 30 mg at bedtime.  If her symptoms worsen or she develops new symptoms she should let us know.  She will follow-up in 6 months or sooner if needed.    Ward Givens, MSN, NP-C 09/10/2017, 2:34 PM [facility] [address], [place], [place] [address] [phone]  I reviewed the above note and documentation by the Nurse Practitioner and agree with the history, physical exam, assessment and plan as outlined above. I was immediately available for face-to-face consultation. Star Age, MD, PhD [facility])

## 2017-09-10 NOTE — Patient Instructions (Signed)
Your Plan:  Continue using CPAP nightly Nortriptyline increase to 30 mg at bedtime If your symptoms worsen or you develop new symptoms please let us know.   Thank you for coming to see [place] at [facility]. I hope we have been able to provide you high quality care today.  You may receive a patient satisfaction survey over the next few weeks. We would appreciate your feedback and comments so that we may continue to improve ourselves and the health of our patients.

## 2017-09-11 ENCOUNTER — Other Ambulatory Visit: Payer: Self-pay | Admitting: Neurology

## 2017-09-18 ENCOUNTER — Other Ambulatory Visit: Payer: Self-pay | Admitting: Neurology

## 2017-09-25 ENCOUNTER — Other Ambulatory Visit: Payer: Self-pay | Admitting: Neurology

## 2017-10-01 ENCOUNTER — Encounter: Payer: Self-pay | Admitting: Nurse Practitioner

## 2017-10-01 ENCOUNTER — Ambulatory Visit (INDEPENDENT_AMBULATORY_CARE_PROVIDER_SITE_OTHER): Payer: 59 | Admitting: Nurse Practitioner

## 2017-10-01 ENCOUNTER — Other Ambulatory Visit: Payer: Self-pay | Admitting: Neurology

## 2017-10-01 VITALS — BP 136/82 | HR 66 | Temp 97.7°F | Ht 61.0 in | Wt 252.0 lb

## 2017-10-01 DIAGNOSIS — I10 Essential (primary) hypertension: Secondary | ICD-10-CM

## 2017-10-01 DIAGNOSIS — J4520 Mild intermittent asthma, uncomplicated: Secondary | ICD-10-CM

## 2017-10-01 DIAGNOSIS — K219 Gastro-esophageal reflux disease without esophagitis: Secondary | ICD-10-CM

## 2017-10-01 MED ORDER — ALBUTEROL SULFATE HFA 108 (90 BASE) MCG/ACT IN AERS: 1.0000 | INHALATION_SPRAY | Freq: Four times a day (QID) | RESPIRATORY_TRACT | 5 refills | Status: DC | PRN

## 2017-10-01 MED ORDER — RANITIDINE HCL 150 MG PO TABS: 150.0000 mg | ORAL_TABLET | Freq: Two times a day (BID) | ORAL | 3 refills | Status: DC

## 2017-10-01 NOTE — Patient Instructions (Signed)
Continue medications as prescribed  Please discuss headache treatment with neurology.

## 2017-10-01 NOTE — Progress Notes (Signed)
Subjective:  Patient ID: Sandra Brennan, female    DOB: 1945-04-08  Age: 72 y.o. MRN: [id number]  CC: Follow-up (GERN and hoarseness is getting better.  FYI--will schedule appt with new doc); Dizziness (patient stated she felt headed,wants to pass out. this is going on 1 wk. ); and Headache (complaining of headache, she is seeing doc for this already?)  HPI   GERD:  improved hoarseness with zantac BID.  HTN: Controlled with lisinopril/HCTZ. Chronic LE edema (R>L) BP Readings from Last 3 Encounters:  10/01/17 136/82  09/10/17 132/75  08/31/17 122/72   Headache and dizziness: Persistent, managed by Dr. Clabe Seal, last seen 08/13/2017. Nortriptyline prescribed, not no improvement.  Asthma: Needs proventil switched to proair due to insurance coverage. Use of inhaler once a day as needed for SOB and wheezing.  Reviewed past Medical, Social and Family history today.  Outpatient Medications Prior to Visit  Medication Sig Dispense Refill  . allopurinol (ZYLOPRIM) 100 MG tablet Take 1 tablet (100 mg total) by mouth daily. 90 tablet 3  . aspirin EC 81 MG tablet Take 81 mg by mouth daily.    Marland Kitchen atorvastatin (LIPITOR) 10 MG tablet Take 1 tablet (10 mg total) by mouth daily at 6 PM. 90 tablet 3  . cetirizine (ZYRTEC) 5 MG tablet Take 1 tablet (5 mg total) by mouth daily.    . cholecalciferol (VITAMIN D) 1000 units tablet Take 1 tablet (1,000 Units total) by mouth daily. (Patient taking differently: Take 1,000 Units by mouth 2 (two) times daily. )    . cycloSPORINE (RESTASIS) 0.05 % ophthalmic emulsion Restasis 0.05 % eye drops in a dropperette    . lisinopril-hydrochlorothiazide (ZESTORETIC) 10-12.5 MG tablet Take 1 tablet by mouth daily. 90 tablet 3  . nortriptyline (PAMELOR) 10 MG capsule Take 3 capsules (30 mg total) by mouth at bedtime. Take 2 capsules at bedtime. 270 capsule 3  . polyethylene glycol powder (GLYCOLAX/MIRALAX) powder Take 17 g by mouth daily. 850 g 3  . pregabalin  (LYRICA) 75 MG capsule Take 1 capsule (75 mg total) by mouth 2 (two) times daily. 60 capsule 5  . Sennosides 8.6 MG CAPS Take by mouth.    . traMADol (ULTRAM) 50 MG tablet Take 1 tablet (50 mg total) by mouth 2 (two) times daily. 60 tablet 5  . albuterol (PROVENTIL HFA;VENTOLIN HFA) 108 (90 Base) MCG/ACT inhaler Inhale 1-2 puffs into the lungs every 6 (six) hours as needed for wheezing. 1 Inhaler 5  . ranitidine (ZANTAC) 150 MG tablet Take 1 tablet (150 mg total) by mouth 2 (two) times daily. 60 tablet 0  . calcium carbonate (CALCIUM 600) 600 MG TABS tablet Take 1 tablet (600 mg total) by mouth 2 (two) times daily with a meal. (Patient not taking: Reported on 09/10/2017) 60 tablet    No facility-administered medications prior to visit.     ROS Review of Systems  Respiratory: Negative for cough.   Cardiovascular: Positive for leg swelling. Negative for palpitations.  Gastrointestinal: Negative for abdominal pain, blood in stool and melena.    Objective:  BP 136/82   Pulse 66   Temp 97.7 F (36.5 C) (Oral)   Ht 5\' 1"  (1.549 m)   Wt 252 lb (114.3 kg)   SpO2 96%   BMI 47.61 kg/m   BP Readings from Last 3 Encounters:  10/01/17 136/82  09/10/17 132/75  08/31/17 122/72    Wt Readings from Last 3 Encounters:  10/01/17 252 lb (114.3 kg)  09/10/17 250  lb (113.4 kg)  08/31/17 248 lb (112.5 kg)    Physical Exam  Constitutional: She is oriented to person, place, and time. She does not appear ill.  Cardiovascular: Normal rate and regular rhythm.  Pulmonary/Chest: Breath sounds normal. No stridor. She has no wheezes.  Musculoskeletal: She exhibits edema. She exhibits no tenderness.  Bilateral LE edema (R>L)  Neurological: She is alert and oriented to person, place, and time.  Skin: No rash noted. No erythema.  Vitals reviewed.  Lab Results  Component Value Date   WBC 6.4 05/03/2017   HGB 11.4 (L) 05/03/2017   HCT 34.7 (L) 05/03/2017   PLT 219 05/03/2017   GLUCOSE 74 08/31/2017    CHOL 132 08/31/2017   TRIG 42.0 08/31/2017   HDL 68.90 08/31/2017   LDLCALC 54 08/31/2017   ALT 10 08/31/2017   AST 19 08/31/2017   NA 139 08/31/2017   K 4.0 08/31/2017   CL 106 08/31/2017   CREATININE 1.14 08/31/2017   BUN 16 08/31/2017   CO2 26 08/31/2017   TSH 2.80 02/26/2017   INR 1.17 08/27/2012   HGBA1C 5.9 08/31/2017   MICROALBUR <0.7 08/31/2017    Dg Chest 2 View  Result Date: 05/03/2017 CLINICAL DATA:  Chest pain. EXAM: CHEST  2 VIEW COMPARISON:  Radiographs of March 30, 2016. FINDINGS: The heart size and mediastinal contours are within normal limits. Both lungs are clear. No pneumothorax or pleural effusion is noted. The visualized skeletal structures are unremarkable. IMPRESSION: No active cardiopulmonary disease. Electronically Signed   By: Marijo Conception, M.D.   On: 05/03/2017 14:15    Assessment & Plan:   Manvi was seen today for follow-up, dizziness and headache.  Diagnoses and all orders for this visit:  Essential hypertension  Gastroesophageal reflux disease, esophagitis presence not specified -     ranitidine (ZANTAC) 150 MG tablet; Take 1 tablet (150 mg total) by mouth 2 (two) times daily.  Mild intermittent chronic asthma without complication -     albuterol (PROAIR HFA) 108 (90 Base) MCG/ACT inhaler; Inhale 1-2 puffs into the lungs every 6 (six) hours as needed for wheezing or shortness of breath.   I have discontinued Sandra Brennan calcium carbonate and albuterol. I am also having her start on albuterol. Additionally, I am having her maintain her aspirin EC, Sennosides, polyethylene glycol powder, pregabalin, traMADol, cycloSPORINE, cholecalciferol, cetirizine, atorvastatin, lisinopril-hydrochlorothiazide, allopurinol, nortriptyline, and ranitidine.  Meds ordered this encounter  Medications  . ranitidine (ZANTAC) 150 MG tablet    Sig: Take 1 tablet (150 mg total) by mouth 2 (two) times daily.    Dispense:  180 tablet    Refill:  3     Order Specific Question:   Supervising Provider    Answer:   Lucille Passy [[id number]]  . albuterol (PROAIR HFA) 108 (90 Base) MCG/ACT inhaler    Sig: Inhale 1-2 puffs into the lungs every 6 (six) hours as needed for wheezing or shortness of breath.    Dispense:  1 Inhaler    Refill:  5    Order Specific Question:   Supervising Provider    Answer:   Lucille Passy [[id number]]   Follow-up: Return in about 6 months (around 04/03/2018) for DM and HTN, hyperlipidemia (fasting, need A1c, lipid, BMP) .  Wilfred Lacy, NP

## 2017-10-02 ENCOUNTER — Other Ambulatory Visit: Payer: Self-pay | Admitting: Neurology

## 2017-10-09 ENCOUNTER — Other Ambulatory Visit: Payer: Self-pay | Admitting: Neurology

## 2017-10-16 ENCOUNTER — Other Ambulatory Visit: Payer: Self-pay | Admitting: Neurology

## 2017-10-23 ENCOUNTER — Other Ambulatory Visit: Payer: Self-pay | Admitting: Neurology

## 2017-10-30 ENCOUNTER — Other Ambulatory Visit: Payer: Self-pay | Admitting: Neurology

## 2017-11-06 ENCOUNTER — Other Ambulatory Visit: Payer: Self-pay | Admitting: Neurology

## 2018-01-04 ENCOUNTER — Encounter (INDEPENDENT_AMBULATORY_CARE_PROVIDER_SITE_OTHER): Payer: Self-pay | Admitting: Orthopaedic Surgery

## 2018-01-04 ENCOUNTER — Encounter (INDEPENDENT_AMBULATORY_CARE_PROVIDER_SITE_OTHER): Payer: Self-pay

## 2018-01-04 ENCOUNTER — Ambulatory Visit (INDEPENDENT_AMBULATORY_CARE_PROVIDER_SITE_OTHER): Payer: Self-pay

## 2018-01-04 ENCOUNTER — Ambulatory Visit (INDEPENDENT_AMBULATORY_CARE_PROVIDER_SITE_OTHER): Payer: 59 | Admitting: Orthopaedic Surgery

## 2018-01-04 VITALS — BP 138/81 | HR 76 | Wt 245.0 lb

## 2018-01-04 DIAGNOSIS — M25551 Pain in right hip: Secondary | ICD-10-CM | POA: Diagnosis not present

## 2018-01-04 DIAGNOSIS — M25552 Pain in left hip: Secondary | ICD-10-CM

## 2018-01-04 NOTE — Progress Notes (Signed)
Office Visit Note   Patient: Sandra Brennan           Date of Birth: [birth year]           MRN: [id number] Visit Date: 01/04/2018              Requested by: Flossie Buffy, NP [place], [place] [address] PCP: Flossie Buffy, NP   Assessment & Plan: Visit Diagnoses:  1. Pain in left hip   2. Pain in right hip     Plan: Left hip pain is probably related to some early wear pattern of her total hip replacement and specifically the polyethylene component.  Her BMI is 47.  Long discussion regarding weight loss and use of anti-inflammatory medicines.  Had an MRI scan of lumbar spine in 2015 demonstrating several levels of spinal stenosis severe at L4-5 greater on the left.  That certainly could be causing her to have some of her left-sided pain.  I have also asked her to check with her family physician regarding possible gabapentin use for her neuropathy.  Consider repeat epidural steroid injections.  Office visit over 30 minutes of the time in counseling  Follow-Up Instructions: Return in about 3 months (around 04/06/2018).   Orders:  Orders Placed This Encounter  Procedures  . XR HIPS BILAT W OR W/O PELVIS 3-4 VIEWS   No orders of the defined types were placed in this encounter.     Procedures: No procedures performed   Clinical Data: No additional findings.   Subjective: Chief Complaint  Patient presents with  . New Patient (Initial Visit)    L HIP PAIN OFF AND ON 6 MO GETTING WORSE PT HAS FALLEN 2 TIMES, LAST TIME WAS 1 MO AGO. R FOOT PAIN FOR OVER 1 YR FROM A FALL STILL NO BETTER  Sandra Brennan is 72 years old and visits the office primarily for evaluation of left hip pain.  She denies any history of injury or trauma.  She has had some progressive pain over period of many months.  She had a prior left total hip replacement in 2014 without any complications.  She is also had bilateral total knee replacements.  She is a diabetic and has developed  peripheral neuropathy with some burning and tingling in both of her feet.  She relates an issue with balance and uses a cane.  The pain on the left side is in the "area of my left hip".  This includes buttock pain lateral hip pain and even some groin discomfort.  No feeling of instability. Sandra Brennan has also had an issue with her lumbar spine.  She had an MRI scan in 2015 demonstrating multiple areas of stenosis more severe at L4-5.  He has had prior injections.  Also has been treated for obstructive sleep apnea HPI  Review of Systems  Constitutional: Positive for fatigue. Negative for fever.  HENT: Positive for ear pain.   Eyes: Positive for pain.  Respiratory: Negative for cough and shortness of breath.   Cardiovascular: Positive for leg swelling.  Gastrointestinal: Positive for constipation. Negative for diarrhea.  Genitourinary: Positive for difficulty urinating.  Musculoskeletal: Positive for back pain. Negative for neck pain.  Skin: Positive for rash.  Allergic/Immunologic: Positive for food allergies.  Neurological: Positive for weakness and numbness.  Hematological: Bruises/bleeds easily.  Psychiatric/Behavioral: Positive for sleep disturbance.     Objective: Vital Signs: BP 138/81 (BP Location: Left Arm, Patient Position: Sitting, Cuff Size: Normal)  Pulse 76   Wt 245 lb (111.1 kg)   BMI 46.29 kg/m   Physical Exam  Constitutional: She is oriented to person, place, and time. She appears well-developed and well-nourished.  HENT:  Mouth/Throat: Oropharynx is clear and moist.  Eyes: Pupils are equal, round, and reactive to light. EOM are normal.  Pulmonary/Chest: Effort normal.  Neurological: She is alert and oriented to person, place, and time.  Skin: Skin is warm and dry.  Psychiatric: She has a normal mood and affect. Her behavior is normal.    Ortho Exam awake alert and oriented x3.  Comfortable sitting.  Overweight with a BMI of 47.  Does use a cane in her right  hand.  She has some altered sensibility in both of her feet with some pitting edema of her ankles.  Many areas of painful trigger point in both lower extremities.  No evidence of cellulitis.  I was able to fully extend both knees and flex about 90 degrees.  No evidence of instability.  Some mild pain in her left groin with internal and external rotation.  Straight leg raise negative  Specialty Comments:  No specialty comments available.  Imaging: Xr Hips Bilat W Or W/o Pelvis 3-4 Views  Result Date: 01/04/2018 Films of the pelvis and lateral films of both hips were obtained.  There is some evidence of polyethylene wear to the left total hip replacement.  Mild arthritic changes are noted of the right hip with no acute change.  No evidence of loosening of the left hip.  The acetabular component is in a vertical orientation    PMFS History: Patient Active Problem List   Diagnosis Date Noted  . Migraine without aura and without status migrainosus, not intractable 09/10/2017  . Post-traumatic osteoarthritis of right ankle 06/09/2016  . Spondylosis without myelopathy or radiculopathy, lumbar region 06/09/2016  . Cervical spinal stenosis 06/09/2016  . Spinal stenosis, lumbar region, without neurogenic claudication 06/09/2016  . Chronic gouty arthritis 06/04/2016  . Vitamin D deficiency 06/02/2016  . Hyperlipidemia 06/02/2016  . Closed right ankle fracture 05/01/2016  . Colon polyps 05/01/2016  . Fibromyalgia syndrome 05/01/2016  . Syncope 03/30/2016  . Chest pain 03/30/2016  . Fall 03/30/2016  . Obesity 05/06/2013  . Acute nontraumatic kidney injury ([place]) 05/05/2013  . Acute posthemorrhagic anemia 09/06/2012  . Gout 09/05/2012  . Osteoarthritis of left hip 08/05/2012  . Type 2 diabetes mellitus with diabetic polyneuropathy, without long-term current use of insulin ([place]) 08/05/2012  . Hypertension 08/05/2012  . Asthma, chronic 08/05/2012  . Sleep apnea 08/05/2012   Past Medical  History:  Diagnosis Date  . Anemia   . Arthritis    "plenty" (Aug 25, 2012)  . Asthma   . Chronic lower back pain    "they say I need a whole new spinal column" (08/25/2012)  . COPD (chronic obstructive pulmonary disease) ([place])   . Degenerative arthritis    "all over" (08/25/12)  . Fx ankle   . GERD (gastroesophageal reflux disease)   . Gout 05/05/2013  . Hypertension   . Migraines    "used to; totally stopped when I quit drinking 30 yr ago" (08/25/2012)  . Myalgia and myositis   . Myocardial infarction [facility]) 1992   2012/08/25 "mild MI when son died "  . Obesity   . Osteoporosis    "all over" (25-Aug-2012)  . Shortness of breath    when stressed.  . Sleep apnea    "never did fix my machine; haven't used one for  5 years; I've lost 116# since then; no problems now" (08/03/2012)  . Type II diabetes mellitus ([place])    "borderline; don't test; take Metformin" (08/03/2012)    Family History  Problem Relation Age of Onset  . Arthritis Mother   . Arthritis Father   . Colon cancer Father   . Diabetes Sister   . Arthritis Sister   . Diabetes Maternal Grandmother   . Arthritis Maternal Grandmother   . Diabetes Maternal Grandfather   . Arthritis Maternal Grandfather     Past Surgical History:  Procedure Laterality Date  . ABDOMINAL HYSTERECTOMY  1990's  . GANGLION CYST EXCISION Right 1980's   "wrist" (08/03/2012)  . JOINT REPLACEMENT    . KNEE LIGAMENT RECONSTRUCTION Right 1980's  . TONSILLECTOMY  ~ 1954  . TOTAL HIP ARTHROPLASTY Left 08/03/2012  . TOTAL HIP ARTHROPLASTY Left 08/03/2012   Procedure: TOTAL HIP ARTHROPLASTY;  Surgeon: Garald Balding, MD;  Location: [facility];  Service: Orthopedics;  Laterality: Left;  Left Total Hip Arthroplasty  . TOTAL HIP ARTHROPLASTY Left 08/28/2012   Procedure: Irrigation and Debridement hip ;  Surgeon: Mcarthur Rossetti, MD;  Location: [facility];  Service: Orthopedics;  Laterality: Left;  . TOTAL KNEE ARTHROPLASTY Left 2001  . TOTAL KNEE ARTHROPLASTY Right 2004     Social History   Occupational History  . Occupation: Retired  Tobacco Use  . Smoking status: Former Smoker    Packs/day: 0.12    Years: 2.00    Pack years: 0.24    Types: Cigarettes  . Smokeless tobacco: Never Used  . Tobacco comment: 08/03/2012 "quit 30 years ago"  Substance and Sexual Activity  . Alcohol use: No    Alcohol/week: 0.0 standard drinks    Comment: 08/03/2012 "used to be a beeralcoholic"; stopped ~ 30 yr ago"  . Drug use: No  . Sexual activity: Never

## 2018-02-01 ENCOUNTER — Encounter: Payer: Self-pay | Admitting: Physical Medicine & Rehabilitation

## 2018-02-01 ENCOUNTER — Encounter: Payer: 59 | Attending: Physical Medicine & Rehabilitation

## 2018-02-01 ENCOUNTER — Ambulatory Visit (HOSPITAL_BASED_OUTPATIENT_CLINIC_OR_DEPARTMENT_OTHER): Payer: 59 | Admitting: Physical Medicine & Rehabilitation

## 2018-02-01 VITALS — BP 145/84 | HR 82 | Resp 14 | Ht 61.0 in | Wt 263.0 lb

## 2018-02-01 DIAGNOSIS — E119 Type 2 diabetes mellitus without complications: Secondary | ICD-10-CM | POA: Insufficient documentation

## 2018-02-01 DIAGNOSIS — Z87891 Personal history of nicotine dependence: Secondary | ICD-10-CM | POA: Diagnosis not present

## 2018-02-01 DIAGNOSIS — M48062 Spinal stenosis, lumbar region with neurogenic claudication: Secondary | ICD-10-CM | POA: Diagnosis present

## 2018-02-01 DIAGNOSIS — Z9889 Other specified postprocedural states: Secondary | ICD-10-CM | POA: Insufficient documentation

## 2018-02-01 DIAGNOSIS — I252 Old myocardial infarction: Secondary | ICD-10-CM | POA: Diagnosis not present

## 2018-02-01 DIAGNOSIS — Z833 Family history of diabetes mellitus: Secondary | ICD-10-CM | POA: Diagnosis not present

## 2018-02-01 DIAGNOSIS — Z90710 Acquired absence of both cervix and uterus: Secondary | ICD-10-CM | POA: Insufficient documentation

## 2018-02-01 DIAGNOSIS — M1990 Unspecified osteoarthritis, unspecified site: Secondary | ICD-10-CM | POA: Insufficient documentation

## 2018-02-01 DIAGNOSIS — Z9089 Acquired absence of other organs: Secondary | ICD-10-CM | POA: Diagnosis not present

## 2018-02-01 DIAGNOSIS — J449 Chronic obstructive pulmonary disease, unspecified: Secondary | ICD-10-CM | POA: Insufficient documentation

## 2018-02-01 DIAGNOSIS — I10 Essential (primary) hypertension: Secondary | ICD-10-CM | POA: Diagnosis not present

## 2018-02-01 DIAGNOSIS — Z8261 Family history of arthritis: Secondary | ICD-10-CM | POA: Diagnosis not present

## 2018-02-01 DIAGNOSIS — M810 Age-related osteoporosis without current pathological fracture: Secondary | ICD-10-CM | POA: Insufficient documentation

## 2018-02-01 DIAGNOSIS — J45909 Unspecified asthma, uncomplicated: Secondary | ICD-10-CM | POA: Diagnosis not present

## 2018-02-01 MED ORDER — TRAMADOL HCL 50 MG PO TABS: 50.0000 mg | ORAL_TABLET | Freq: Two times a day (BID) | ORAL | 5 refills | Status: DC

## 2018-02-01 MED ORDER — PREGABALIN 100 MG PO CAPS: 100.0000 mg | ORAL_CAPSULE | Freq: Two times a day (BID) | ORAL | 5 refills | Status: DC

## 2018-02-01 NOTE — Progress Notes (Signed)
Subjective:    Patient ID: Sandra Brennan, female    DOB: 1945/05/30, 72 y.o.   MRN: [id number]  HPI  Left hip pain, seen by ortho, did not feel this is related to Hip OA, may be stenosis related Pain increased with walking Gained some weight, ortho advised weight loss Right ankle pain  Pain Inventory Average Pain 8 Pain Right Now 8 My pain is constant  In the last 24 hours, has pain interfered with the following? General activity 7 Relation with others 7 Enjoyment of life 0 What TIME of day is your pain at its worst? all Sleep (in general) Fair  Pain is worse with: walking, standing and some activites Pain improves with: medication Relief from Meds: 5  Mobility walk with assistance use a cane  Function disabled: date disabled .  Neuro/Psych trouble walking  Prior Studies Any changes since last visit?  no  Physicians involved in your care Any changes since last visit?  no   Family History  Problem Relation Age of Onset  . Arthritis Mother   . Arthritis Father   . Colon cancer Father   . Diabetes Sister   . Arthritis Sister   . Diabetes Maternal Grandmother   . Arthritis Maternal Grandmother   . Diabetes Maternal Grandfather   . Arthritis Maternal Grandfather    Social History   Socioeconomic History  . Marital status: Widowed    Spouse name: Not on file  . Number of children: 5  . Years of education: PhD  . Highest education level: Not on file  Occupational History  . Occupation: Retired  Scientific laboratory technician  . Financial resource strain: Not on file  . Food insecurity:    Worry: Not on file    Inability: Not on file  . Transportation needs:    Medical: Not on file    Non-medical: Not on file  Tobacco Use  . Smoking status: Former Smoker    Packs/day: 0.12    Years: 2.00    Pack years: 0.24    Types: Cigarettes  . Smokeless tobacco: Never Used  . Tobacco comment: 08/03/2012 "quit 30 years ago"  Substance and Sexual Activity  . Alcohol use: No      Alcohol/week: 0.0 standard drinks    Comment: 08/03/2012 "used to be a beeralcoholic"; stopped ~ 30 yr ago"  . Drug use: No  . Sexual activity: Never  Lifestyle  . Physical activity:    Days per week: Not on file    Minutes per session: Not on file  . Stress: Not on file  Relationships  . Social connections:    Talks on phone: Not on file    Gets together: Not on file    Attends religious service: Not on file    Active member of club or organization: Not on file    Attends meetings of clubs or organizations: Not on file    Relationship status: Not on file  Other Topics Concern  . Not on file  Social History Narrative   Lives at home with her mother and caregiver.   Occasional use of caffeine.   Right-handed.   Past Surgical History:  Procedure Laterality Date  . ABDOMINAL HYSTERECTOMY  1990's  . GANGLION CYST EXCISION Right 1980's   "wrist" (08/03/2012)  . JOINT REPLACEMENT    . KNEE LIGAMENT RECONSTRUCTION Right 1980's  . TONSILLECTOMY  ~ 1954  . TOTAL HIP ARTHROPLASTY Left 08/03/2012  . TOTAL HIP ARTHROPLASTY Left 08/03/2012  Procedure: TOTAL HIP ARTHROPLASTY;  Surgeon: Garald Balding, MD;  Location: [facility];  Service: Orthopedics;  Laterality: Left;  Left Total Hip Arthroplasty  . TOTAL HIP ARTHROPLASTY Left 08/28/2012   Procedure: Irrigation and Debridement hip ;  Surgeon: Mcarthur Rossetti, MD;  Location: [facility];  Service: Orthopedics;  Laterality: Left;  . TOTAL KNEE ARTHROPLASTY Left 2001  . TOTAL KNEE ARTHROPLASTY Right 2004   Past Medical History:  Diagnosis Date  . Anemia   . Arthritis    "plenty" (08/23/12)  . Asthma   . Chronic lower back pain    "they say I need a whole new spinal column" (08/23/2012)  . COPD (chronic obstructive pulmonary disease) ([place])   . Degenerative arthritis    "all over" (2012-08-23)  . Fx ankle   . GERD (gastroesophageal reflux disease)   . Gout 05/05/2013  . Hypertension   . Migraines    "used to; totally stopped when I quit  drinking 30 yr ago" (August 23, 2012)  . Myalgia and myositis   . Myocardial infarction [facility]) 1992   08/23/2012 "mild MI when son died "  . Obesity   . Osteoporosis    "all over" (08-23-2012)  . Shortness of breath    when stressed.  . Sleep apnea    "never did fix my machine; haven't used one for 5 years; I've lost 116# since then; no problems now" (23-Aug-2012)  . Type II diabetes mellitus ([place])    "borderline; don't test; take Metformin" (08/23/12)   BP (!) 145/84 (BP Location: Right Arm, Patient Position: Sitting, Cuff Size: Large)   Pulse 82   Resp 14   Ht 5\' 1"  (1.549 m)   Wt 263 lb (119.3 kg)   SpO2 98%   BMI 49.69 kg/m   Opioid Risk Score:   Fall Risk Score:  `1  Depression screen PHQ 2/9  Depression screen [facility] 2/9 04/01/2017 02/26/2017 02/02/2017 06/09/2016 05/01/2016  Decreased Interest 0 0 0 3 0  Down, Depressed, Hopeless 0 0 0 0 0  PHQ - 2 Score 0 0 0 3 0  Altered sleeping - - - 2 -  Tired, decreased energy - - - 3 -  Change in appetite - - - 3 -  Feeling bad or failure about yourself  - - - 0 -  Trouble concentrating - - - 0 -  Moving slowly or fidgety/restless - - - 0 -  Suicidal thoughts - - - 0 -  PHQ-9 Score - - - 11 -  Difficult doing work/chores - - - Somewhat difficult -    Review of Systems  Constitutional: Negative.   HENT: Negative.   Eyes: Negative.   Respiratory: Negative.   Cardiovascular: Negative.   Gastrointestinal: Negative.   Endocrine: Negative.   Genitourinary: Negative.   Musculoskeletal: Positive for arthralgias, back pain and gait problem.  Allergic/Immunologic: Negative.   Hematological: Negative.   Psychiatric/Behavioral: Negative.   All other systems reviewed and are negative.      Objective:   Physical Exam  Constitutional: She is oriented to person, place, and time. She appears well-developed and well-nourished.  obese  HENT:  Head: Normocephalic and atraumatic.  Eyes: Pupils are equal, round, and reactive to light. EOM are  normal.  Musculoskeletal:  Lumbar range of motion 25% forward flexion extension lateral bending and rotation  Neurological: She is alert and oriented to person, place, and time. Gait abnormal.  Motor strength is 5/5 bilateral hip flexor knee extensor ankle dorsiflexor  Negative  straight leg raise  Ambulates with a cane no evidence of toe drag or knee instability.  Skin: Skin is warm and dry.  Psychiatric: She has a normal mood and affect.  Nursing note and vitals reviewed.  Lower extremities have normal sensation to light touch       Assessment & Plan:  1.  Lumbar spinal stenosis with neurogenic claudication she has both right and left lower extremity pain, the left lower extremity goes down to the knee right lower extremity goes down to the foot.  We discussed that this lower extremity pain is likely neurogenic and we will increase Lyrica to 100 mg twice daily We discussed increasing lumbar mobility.  Patient states she has home exercise program from physical therapy and she will start performing this. Indication for chronic opioid: See above Medication and dose: Tramadol 50 mg twice daily, Lyrica 100 mg twice daily # pills per month: 60 and 60 Last UDS date: pnd Opioid Treatment Agreement signed (Y/N): 08/07/17 Opioid Treatment Agreement last reviewed with patient:  08/07/17 [facility] reviewed this encounter (include red flags):  02/01/18

## 2018-02-01 NOTE — Patient Instructions (Signed)
Please start your home exercise program

## 2018-03-01 LAB — HM MAMMOGRAPHY

## 2018-03-02 ENCOUNTER — Encounter: Payer: Self-pay | Admitting: Nurse Practitioner

## 2018-03-08 ENCOUNTER — Encounter: Payer: Self-pay | Admitting: Adult Health

## 2018-03-16 ENCOUNTER — Telehealth: Payer: Self-pay | Admitting: Neurology

## 2018-03-16 DIAGNOSIS — Z9989 Dependence on other enabling machines and devices: Principal | ICD-10-CM

## 2018-03-16 DIAGNOSIS — G4733 Obstructive sleep apnea (adult) (pediatric): Secondary | ICD-10-CM

## 2018-03-16 NOTE — Telephone Encounter (Signed)
Order for cpap supplies placed, sent to [facility].  I called pt and advised her of this. Pt verbalized understanding.

## 2018-03-16 NOTE — Telephone Encounter (Signed)
Pt is needing new equipment for her Cpap. Please advise.

## 2018-04-05 ENCOUNTER — Ambulatory Visit: Payer: 59 | Admitting: Nurse Practitioner

## 2018-04-09 ENCOUNTER — Encounter: Payer: Self-pay | Admitting: Nurse Practitioner

## 2018-04-09 ENCOUNTER — Ambulatory Visit (INDEPENDENT_AMBULATORY_CARE_PROVIDER_SITE_OTHER): Payer: Medicare Other | Admitting: Nurse Practitioner

## 2018-04-09 VITALS — BP 142/80 | HR 73 | Temp 98.7°F | Ht 61.0 in | Wt 265.0 lb

## 2018-04-09 DIAGNOSIS — M1A9XX0 Chronic gout, unspecified, without tophus (tophi): Secondary | ICD-10-CM | POA: Diagnosis not present

## 2018-04-09 DIAGNOSIS — E1142 Type 2 diabetes mellitus with diabetic polyneuropathy: Secondary | ICD-10-CM

## 2018-04-09 DIAGNOSIS — I10 Essential (primary) hypertension: Secondary | ICD-10-CM | POA: Diagnosis not present

## 2018-04-09 DIAGNOSIS — R300 Dysuria: Secondary | ICD-10-CM | POA: Diagnosis not present

## 2018-04-09 DIAGNOSIS — J4521 Mild intermittent asthma with (acute) exacerbation: Secondary | ICD-10-CM

## 2018-04-09 DIAGNOSIS — E782 Mixed hyperlipidemia: Secondary | ICD-10-CM

## 2018-04-09 DIAGNOSIS — M1A00X0 Idiopathic chronic gout, unspecified site, without tophus (tophi): Secondary | ICD-10-CM

## 2018-04-09 MED ORDER — BENZONATATE 100 MG PO CAPS: 100.0000 mg | ORAL_CAPSULE | Freq: Three times a day (TID) | ORAL | 0 refills | Status: DC | PRN

## 2018-04-09 MED ORDER — BUDESONIDE-FORMOTEROL FUMARATE 160-4.5 MCG/ACT IN AERO: 1.0000 | INHALATION_SPRAY | Freq: Two times a day (BID) | RESPIRATORY_TRACT | 0 refills | Status: DC

## 2018-04-09 NOTE — Progress Notes (Signed)
Subjective:  Patient ID: Sandra Brennan, female    DOB: [birth year]  Age: 73 y.o. MRN: [id number]  CC: Follow-up (6 mo fu/ate 5 hrs ago/pt is c/o of feet pain,cant stand or walk. going on 2 wks. saw bone doctor--he directed to PCP neuropathy?Marland Kitchen FYI no eye doc) and Cough (coughing thick mucus--going on for while--cause asthma to flar ups. using flonase. )  Cough  This is a recurrent problem. The current episode started more than 1 month ago. The problem has been waxing and waning. The cough is productive of sputum. Associated symptoms include shortness of breath and wheezing. Pertinent negatives include no chills, fever, nasal congestion, postnasal drip or rhinorrhea. The symptoms are aggravated by lying down and cold air. She has tried a beta-agonist inhaler and OTC cough suppressant for the symptoms. The treatment provided mild relief. Her past medical history is significant for asthma and environmental allergies.   chronic constipation:  Use of miralax and colace, prunes and applesauce with significant relief.  HTN: Controlled with lisinopril/HCTZ. BP Readings from Last 3 Encounters:  04/09/18 (!) 142/80  02/01/18 (!) 145/84  01/04/18 138/81   DM: Controlled with diet. Last hbgA1c of 5.9  Reviewed past Medical, Social and Family history today.  Outpatient Medications Prior to Visit  Medication Sig Dispense Refill  . albuterol (PROAIR HFA) 108 (90 Base) MCG/ACT inhaler Inhale 1-2 puffs into the lungs every 6 (six) hours as needed for wheezing or shortness of breath. 1 Inhaler 5  . aspirin EC 81 MG tablet Take 81 mg by mouth daily.    . cetirizine (ZYRTEC) 5 MG tablet Take 1 tablet (5 mg total) by mouth daily.    . cholecalciferol (VITAMIN D) 1000 units tablet Take 1 tablet (1,000 Units total) by mouth daily. (Patient taking differently: Take 1,000 Units by mouth 2 (two) times daily. )    . cycloSPORINE (RESTASIS) 0.05 % ophthalmic emulsion Restasis 0.05 % eye drops in a dropperette      . polyethylene glycol powder (GLYCOLAX/MIRALAX) powder Take 17 g by mouth daily. 850 g 3  . pregabalin (LYRICA) 100 MG capsule Take 1 capsule (100 mg total) by mouth 2 (two) times daily. 60 capsule 5  . ranitidine (ZANTAC) 150 MG tablet Take 1 tablet (150 mg total) by mouth 2 (two) times daily. 180 tablet 3  . Sennosides 8.6 MG CAPS Take by mouth.    . traMADol (ULTRAM) 50 MG tablet Take 1 tablet (50 mg total) by mouth 2 (two) times daily. 60 tablet 5  . allopurinol (ZYLOPRIM) 100 MG tablet Take 1 tablet (100 mg total) by mouth daily. 90 tablet 3  . atorvastatin (LIPITOR) 10 MG tablet Take 1 tablet (10 mg total) by mouth daily at 6 PM. 90 tablet 3  . lisinopril-hydrochlorothiazide (ZESTORETIC) 10-12.5 MG tablet Take 1 tablet by mouth daily. 90 tablet 3   No facility-administered medications prior to visit.     ROS See HPI  Objective:  BP (!) 142/80   Pulse 73   Temp 98.7 F (37.1 C) (Oral)   Ht 5\' 1"  (1.549 m)   Wt 265 lb (120.2 kg)   SpO2 95%   BMI 50.07 kg/m   BP Readings from Last 3 Encounters:  04/09/18 (!) 142/80  02/01/18 (!) 145/84  01/04/18 138/81    Wt Readings from Last 3 Encounters:  04/09/18 265 lb (120.2 kg)  02/01/18 263 lb (119.3 kg)  01/04/18 245 lb (111.1 kg)    Physical Exam Vitals signs reviewed.  Cardiovascular:     Rate and Rhythm: Normal rate and regular rhythm.  Pulmonary:     Effort: Pulmonary effort is normal.     Breath sounds: Normal breath sounds.  Musculoskeletal:     Right lower leg: No edema.     Left lower leg: No edema.  Neurological:     Mental Status: She is alert and oriented to person, place, and time.     Lab Results  Component Value Date   WBC 6.4 05/03/2017   HGB 11.4 (L) 05/03/2017   HCT 34.7 (L) 05/03/2017   PLT 219 05/03/2017   GLUCOSE 85 04/09/2018   CHOL 132 08/31/2017   TRIG 42.0 08/31/2017   HDL 68.90 08/31/2017   LDLCALC 54 08/31/2017   ALT 10 08/31/2017   AST 19 08/31/2017   NA 142 04/09/2018   K 4.1  04/09/2018   CL 106 04/09/2018   CREATININE 1.39 (H) 04/09/2018   BUN 18 04/09/2018   CO2 26 04/09/2018   TSH 2.80 02/26/2017   INR 1.17 08/27/2012   HGBA1C 5.7 (H) 04/09/2018   MICROALBUR <0.7 08/31/2017    Dg Chest 2 View  Result Date: 05/03/2017 CLINICAL DATA:  Chest pain. EXAM: CHEST  2 VIEW COMPARISON:  Radiographs of March 30, 2016. FINDINGS: The heart size and mediastinal contours are within normal limits. Both lungs are clear. No pneumothorax or pleural effusion is noted. The visualized skeletal structures are unremarkable. IMPRESSION: No active cardiopulmonary disease. Electronically Signed   By: Marijo Brennan, M.D.   On: 05/03/2017 14:15    Assessment & Plan:   Sandra Brennan was seen today for follow-up and cough.  Diagnoses and all orders for this visit:  Essential hypertension -     Basic metabolic panel  Dysuria -     Urinalysis w microscopic + reflex cultur  Mild intermittent chronic asthma with acute exacerbation -     budesonide-formoterol (SYMBICORT) 160-4.5 MCG/ACT inhaler; Inhale 1 puff into the lungs 2 (two) times daily for 30 days. -     benzonatate (TESSALON) 100 MG capsule; Take 1 capsule (100 mg total) by mouth 3 (three) times daily as needed for cough.  Chronic gout involving toe of right foot without tophus, unspecified cause -     Uric acid -     allopurinol (ZYLOPRIM) 100 MG tablet; Take 1 tablet (100 mg total) by mouth daily.  Type 2 diabetes mellitus with diabetic polyneuropathy, without long-term current use of insulin (HCC) -     Hemoglobin A1c -     Ambulatory referral to Ophthalmology  Chronic gouty arthritis -     allopurinol (ZYLOPRIM) 100 MG tablet; Take 1 tablet (100 mg total) by mouth daily.  Mixed hyperlipidemia -     atorvastatin (LIPITOR) 10 MG tablet; Take 1 tablet (10 mg total) by mouth daily at 6 PM.  Hypertension, unspecified type -     lisinopril-hydrochlorothiazide (ZESTORETIC) 10-12.5 MG tablet; Take 1 tablet by mouth  daily.  Other orders -     REFLEXIVE URINE CULTURE   I am having Sandra Brennan start on budesonide-formoterol and benzonatate. I am also having her maintain her aspirin EC, Sennosides, polyethylene glycol powder, cycloSPORINE, cholecalciferol, cetirizine, ranitidine, albuterol, traMADol, pregabalin, allopurinol, atorvastatin, and lisinopril-hydrochlorothiazide.  Meds ordered this encounter  Medications  . budesonide-formoterol (SYMBICORT) 160-4.5 MCG/ACT inhaler    Sig: Inhale 1 puff into the lungs 2 (two) times daily for 30 days.    Dispense:  1 Inhaler    Refill:  0    Order Specific Question:   Supervising Provider    Answer:   MATTHEWS, CODY [[id number]]  . benzonatate (TESSALON) 100 MG capsule    Sig: Take 1 capsule (100 mg total) by mouth 3 (three) times daily as needed for cough.    Dispense:  30 capsule    Refill:  0    Order Specific Question:   Supervising Provider    Answer:   MATTHEWS, CODY [[id number]]  . allopurinol (ZYLOPRIM) 100 MG tablet    Sig: Take 1 tablet (100 mg total) by mouth daily.    Dispense:  90 tablet    Refill:  3    Order Specific Question:   Supervising Provider    Answer:   Lucille Passy [[id number]]  . atorvastatin (LIPITOR) 10 MG tablet    Sig: Take 1 tablet (10 mg total) by mouth daily at 6 PM.    Dispense:  90 tablet    Refill:  3    Dispense as insurance required.    Order Specific Question:   Supervising Provider    Answer:   Lucille Passy [[id number]]  . lisinopril-hydrochlorothiazide (ZESTORETIC) 10-12.5 MG tablet    Sig: Take 1 tablet by mouth daily.    Dispense:  90 tablet    Refill:  3    Order Specific Question:   Supervising Provider    Answer:   Lucille Passy [[id number]]    Problem List Items Addressed This Visit      Cardiovascular and Mediastinum   Hypertension - Primary (Chronic)   Relevant Medications   atorvastatin (LIPITOR) 10 MG tablet   lisinopril-hydrochlorothiazide (ZESTORETIC) 10-12.5 MG tablet   Other Relevant Orders   Basic  metabolic panel (Completed)     Respiratory   Asthma, chronic (Chronic)   Relevant Medications   budesonide-formoterol (SYMBICORT) 160-4.5 MCG/ACT inhaler   benzonatate (TESSALON) 100 MG capsule     Endocrine   Type 2 diabetes mellitus with diabetic polyneuropathy, without long-term current use of insulin (HCC) (Chronic)   Relevant Medications   atorvastatin (LIPITOR) 10 MG tablet   lisinopril-hydrochlorothiazide (ZESTORETIC) 10-12.5 MG tablet   Other Relevant Orders   Hemoglobin A1c (Completed)   Ambulatory referral to Ophthalmology     Musculoskeletal and Integument   Chronic gouty arthritis   Relevant Medications   allopurinol (ZYLOPRIM) 100 MG tablet     Other   Gout   Relevant Medications   allopurinol (ZYLOPRIM) 100 MG tablet   Other Relevant Orders   Uric acid (Completed)   Hyperlipidemia   Relevant Medications   atorvastatin (LIPITOR) 10 MG tablet   lisinopril-hydrochlorothiazide (ZESTORETIC) 10-12.5 MG tablet    Other Visit Diagnoses    Dysuria       Relevant Orders   Urinalysis w microscopic + reflex cultur (Completed)       Follow-up: Return in about 3 months (around 07/08/2018) for DM and HTN, hyperlipidemia.  Wilfred Lacy, NP

## 2018-04-09 NOTE — Patient Instructions (Addendum)
Use symbicort inhaer x 64month, the stop. Rinse mouth after each use.  Go to lab for blood draw and use collection. If cough does not improve in 39month, return to office.

## 2018-04-10 LAB — NO CULTURE INDICATED

## 2018-04-10 LAB — URINALYSIS W MICROSCOPIC + REFLEX CULTURE
BILIRUBIN URINE: NEGATIVE
Bacteria, UA: NONE SEEN /HPF
GLUCOSE, UA: NEGATIVE
Hgb urine dipstick: NEGATIVE
Hyaline Cast: NONE SEEN /LPF
KETONES UR: NEGATIVE
Leukocyte Esterase: NEGATIVE
NITRITES URINE, INITIAL: NEGATIVE
PH: 6.5 (ref 5.0–8.0)
Protein, ur: NEGATIVE
RBC / HPF: NONE SEEN /HPF (ref 0–2)
SPECIFIC GRAVITY, URINE: 1.018 (ref 1.001–1.03)

## 2018-04-10 LAB — BASIC METABOLIC PANEL
BUN/Creatinine Ratio: 13 (calc) (ref 6–22)
BUN: 18 mg/dL (ref 7–25)
CALCIUM: 8.9 mg/dL (ref 8.6–10.4)
CO2: 26 mmol/L (ref 20–32)
Chloride: 106 mmol/L (ref 98–110)
Creat: 1.39 mg/dL — ABNORMAL HIGH (ref 0.60–0.93)
GLUCOSE: 85 mg/dL (ref 65–99)
Potassium: 4.1 mmol/L (ref 3.5–5.3)
Sodium: 142 mmol/L (ref 135–146)

## 2018-04-10 LAB — HEMOGLOBIN A1C
HEMOGLOBIN A1C: 5.7 %{Hb} — AB (ref <5.7)
Mean Plasma Glucose: 117 (calc)
eAG (mmol/L): 6.5 (calc)

## 2018-04-10 LAB — URIC ACID: URIC ACID, SERUM: 6.8 mg/dL (ref 2.5–7.0)

## 2018-04-12 ENCOUNTER — Ambulatory Visit: Payer: 59 | Admitting: Adult Health

## 2018-04-13 ENCOUNTER — Telehealth: Payer: Self-pay | Admitting: Nurse Practitioner

## 2018-04-13 ENCOUNTER — Encounter: Payer: Self-pay | Admitting: Nurse Practitioner

## 2018-04-13 MED ORDER — LISINOPRIL-HYDROCHLOROTHIAZIDE 10-12.5 MG PO TABS: 1.0000 | ORAL_TABLET | Freq: Every day | ORAL | 3 refills | Status: DC

## 2018-04-13 MED ORDER — ATORVASTATIN CALCIUM 10 MG PO TABS: 10.0000 mg | ORAL_TABLET | Freq: Every day | ORAL | 3 refills | Status: DC

## 2018-04-13 MED ORDER — ALLOPURINOL 100 MG PO TABS: 100.0000 mg | ORAL_TABLET | Freq: Every day | ORAL | 3 refills | Status: DC

## 2018-04-13 NOTE — Telephone Encounter (Signed)
Copied from [place] [id number]. Topic: Quick Communication - Lab Results (Clinic Use ONLY) >> Apr 13, 2018  9:45 AM Shawnie Pons, LPN wrote: Called patient to inform them of 04/13/2018 lab results. When patient returns call, triage nurse may disclose results.

## 2018-04-13 NOTE — Telephone Encounter (Signed)
Pt given results per notes of Wilfred Lacy NP on 04/13/18. Unable to document in result note due to result note not being routed to [facility].

## 2018-04-29 NOTE — Progress Notes (Signed)
PATIENT: Sandra Brennan DOB: 1945/05/25  REASON FOR VISIT: follow up HISTORY FROM: patient  HISTORY OF PRESENT ILLNESS: Today 05/03/18  Sandra Brennan is a 73 year old female who presents for follow-up for chronic migraine headaches.  At her last visit in July 2019 her nortriptyline was increased to 30 mg at bedtime.  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  She reports that her headaches are better however she says on average she has about 1-2 bad headaches per month.  She reports the headache will start behind her eyes and spread to bilateral temples.  She will take Tylenol for the headache and the headache is relieved within 1 hour.  She also reports a weight gain of about 60 pounds in the last year.  She reports she continues to use her CPAP machine and denies any problems with the equipment.  She denies any new problems or concerns today she presents today for follow-up unaccompanied.  HISTORY 09/10/2017 MM Sandra Brennan is a 73 year old female with a history of obstructive sleep apnea on CPAP and migraine headaches.  She returns today for follow-up.  Her CPAP download indicates that she use her machine nightly for compliance of 100%.  She use her machine greater than 4 hours each night.  On average she uses her machine 7 hours and 15 minutes.  Her residual AHI is 0.5 on 6 cm of water with EPR of 1.  She does not have a significant leak.  She reports that the CPAP continues to work well for her.  She has been seeing Dr. Krista Blue for migraine headaches.  She states that nortriptyline was working well but recently she has been having a daily headache.  She states that she does keep a 13-year-old during the day as well.  She reports that with her headaches she does have photophobia and phonophobia but denies nausea and vomiting.  She returns today for an evaluation.   REVIEW OF SYSTEMS: Out of a complete 14 system review of symptoms, the patient  complains only of the following symptoms, and all other reviewed systems are negative.  Ear pain, ringing in ears, eye pain, blurred vision, leg swelling, constipation, cold intolerance, apnea, daytime sleepiness, painful urination, joint pain, back pain, neck stiffness, itching, memory loss, headache ALLERGIES: Allergies  Allergen Reactions  . Cymbalta [Duloxetine Hcl] Anaphylaxis  . Pineapple Shortness Of Breath  . Influenza Vaccines Hives and Itching  . Penicillins Hives  . Sulfa Antibiotics Other (See Comments)    REACTION: unknown  . Theophyllines Hives    HOME MEDICATIONS: Outpatient Medications Prior to Visit  Medication Sig Dispense Refill  . albuterol (PROAIR HFA) 108 (90 Base) MCG/ACT inhaler Inhale 1-2 puffs into the lungs every 6 (six) hours as needed for wheezing or shortness of breath. 1 Inhaler 5  . allopurinol (ZYLOPRIM) 100 MG tablet Take 1 tablet (100 mg total) by mouth daily. 90 tablet 3  . aspirin EC 81 MG tablet Take 81 mg by mouth daily.    Marland Kitchen atorvastatin (LIPITOR) 10 MG tablet Take 1 tablet (10 mg total) by mouth daily at 6 PM. 90 tablet 3  . benzonatate (TESSALON) 100 MG capsule Take 1 capsule (100 mg total) by mouth 3 (three) times daily as needed for cough. 30 capsule 0  . budesonide-formoterol (SYMBICORT) 160-4.5 MCG/ACT inhaler Inhale 1 puff into the lungs 2 (two) times daily for 30 days. 1 Inhaler 0  . cetirizine (  ZYRTEC) 5 MG tablet Take 1 tablet (5 mg total) by mouth daily.    . cholecalciferol (VITAMIN D) 1000 units tablet Take 1 tablet (1,000 Units total) by mouth daily. (Patient taking differently: Take 1,000 Units by mouth 2 (two) times daily. )    . cycloSPORINE (RESTASIS) 0.05 % ophthalmic emulsion Restasis 0.05 % eye drops in a dropperette    . lisinopril-hydrochlorothiazide (ZESTORETIC) 10-12.5 MG tablet Take 1 tablet by mouth daily. 90 tablet 3  . polyethylene glycol powder (GLYCOLAX/MIRALAX) powder Take 17 g by mouth daily. 850 g 3  . pregabalin  (LYRICA) 100 MG capsule Take 1 capsule (100 mg total) by mouth 2 (two) times daily. 60 capsule 5  . ranitidine (ZANTAC) 150 MG tablet Take 1 tablet (150 mg total) by mouth 2 (two) times daily. 180 tablet 3  . Sennosides 8.6 MG CAPS Take by mouth.    . traMADol (ULTRAM) 50 MG tablet Take 1 tablet (50 mg total) by mouth 2 (two) times daily. 60 tablet 5   No facility-administered medications prior to visit.     PAST MEDICAL HISTORY: Past Medical History:  Diagnosis Date  . Anemia   . Arthritis    "plenty" (14-Aug-2012)  . Asthma   . Chronic lower back pain    "they say I need a whole new spinal column" (Aug 14, 2012)  . COPD (chronic obstructive pulmonary disease) ([place])   . Degenerative arthritis    "all over" (08-14-2012)  . Fx ankle   . GERD (gastroesophageal reflux disease)   . Gout 05/05/2013  . Hypertension   . Migraines    "used to; totally stopped when I quit drinking 30 yr ago" (2012-08-14)  . Myalgia and myositis   . Myocardial infarction [facility]) 1992   August 14, 2012 "mild MI when son died "  . Obesity   . Osteoporosis    "all over" (08/14/12)  . Shortness of breath    when stressed.  . Sleep apnea    "never did fix my machine; haven't used one for 5 years; I've lost 116# since then; no problems now" (Aug 14, 2012)  . Type II diabetes mellitus ([place])    "borderline; don't test; take Metformin" (Aug 14, 2012)    PAST SURGICAL HISTORY: Past Surgical History:  Procedure Laterality Date  . ABDOMINAL HYSTERECTOMY  1990's  . GANGLION CYST EXCISION Right 1980's   "wrist" (08-14-12)  . JOINT REPLACEMENT    . KNEE LIGAMENT RECONSTRUCTION Right 1980's  . TONSILLECTOMY  ~ 1954  . TOTAL HIP ARTHROPLASTY Left 2012-08-14  . TOTAL HIP ARTHROPLASTY Left 2012-08-14   Procedure: TOTAL HIP ARTHROPLASTY;  Surgeon: Garald Balding, MD;  Location: [facility];  Service: Orthopedics;  Laterality: Left;  Left Total Hip Arthroplasty  . TOTAL HIP ARTHROPLASTY Left 08/28/2012   Procedure: Irrigation and Debridement hip ;   Surgeon: Mcarthur Rossetti, MD;  Location: [facility];  Service: Orthopedics;  Laterality: Left;  . TOTAL KNEE ARTHROPLASTY Left 2001  . TOTAL KNEE ARTHROPLASTY Right 2004    FAMILY HISTORY: Family History  Problem Relation Age of Onset  . Arthritis Mother   . Arthritis Father   . Colon cancer Father   . Diabetes Sister   . Arthritis Sister   . Diabetes Maternal Grandmother   . Arthritis Maternal Grandmother   . Diabetes Maternal Grandfather   . Arthritis Maternal Grandfather     SOCIAL HISTORY: Social History   Socioeconomic History  . Marital status: Widowed    Spouse name: Not on file  .  Number of children: 5  . Years of education: PhD  . Highest education level: Not on file  Occupational History  . Occupation: Retired  Scientific laboratory technician  . Financial resource strain: Not on file  . Food insecurity:    Worry: Not on file    Inability: Not on file  . Transportation needs:    Medical: Not on file    Non-medical: Not on file  Tobacco Use  . Smoking status: Former Smoker    Packs/day: 0.12    Years: 2.00    Pack years: 0.24    Types: Cigarettes  . Smokeless tobacco: Never Used  . Tobacco comment: 08/03/2012 "quit 30 years ago"  Substance and Sexual Activity  . Alcohol use: No    Alcohol/week: 0.0 standard drinks    Comment: 08/03/2012 "used to be a beeralcoholic"; stopped ~ 30 yr ago"  . Drug use: No  . Sexual activity: Never  Lifestyle  . Physical activity:    Days per week: Not on file    Minutes per session: Not on file  . Stress: Not on file  Relationships  . Social connections:    Talks on phone: Not on file    Gets together: Not on file    Attends religious service: Not on file    Active member of club or organization: Not on file    Attends meetings of clubs or organizations: Not on file    Relationship status: Not on file  . Intimate partner violence:    Fear of current or ex partner: Not on file    Emotionally abused: Not on file    Physically abused:  Not on file    Forced sexual activity: Not on file  Other Topics Concern  . Not on file  Social History Narrative   Lives at home with her mother and caregiver.   Occasional use of caffeine.   Right-handed.      PHYSICAL EXAM  Vitals:   05/03/18 0939  BP: (!) 163/76  Pulse: 77  Weight: 269 lb 6.4 oz (122.2 kg)  Height: 5\' 1"  (1.549 m)   Body mass index is 50.9 kg/m.  Generalized: Well developed, in no acute distress  Using a cane,  Neurological examination  Mentation: Alert oriented to time, place, history taking. Follows all commands speech and language fluent Cranial nerve II-XII: Pupils were equal round reactive to light. Extraocular movements were full, visual field were full on confrontational test. Facial sensation and strength were normal. Uvula tongue midline. Head turning and shoulder shrug  were normal and symmetric. Motor: The motor testing reveals 4 over 5 strength of all 4 extremities. Good symmetric motor tone is noted throughout.  Sensory: Sensory testing is intact to soft touch on all 4 extremities. No evidence of extinction is noted.  Coordination: Cerebellar testing reveals good finger-nose-finger and heel-to-shin bilaterally.  Gait and station: Gait is wide-based, mildly unsteady with aid of a cane.  Tandem gait not attempted Reflexes: Deep tendon reflexes are symmetric but decreased bilaterally  DIAGNOSTIC DATA (LABS, IMAGING, TESTING) - I reviewed patient records, labs, notes, testing and imaging myself where available.  Lab Results  Component Value Date   WBC 6.4 05/03/2017   HGB 11.4 (L) 05/03/2017   HCT 34.7 (L) 05/03/2017   MCV 92.0 05/03/2017   PLT 219 05/03/2017      Component Value Date/Time   NA 142 04/09/2018 1549   NA 142 07/14/2014 1014   K 4.1 04/09/2018 1549   CL  106 04/09/2018 1549   CO2 26 04/09/2018 1549   GLUCOSE 85 04/09/2018 1549   BUN 18 04/09/2018 1549   BUN 12 07/14/2014 1014   CREATININE 1.39 (H) 04/09/2018 1549    CALCIUM 8.9 04/09/2018 1549   PROT 6.8 08/31/2017 1108   PROT 6.9 07/14/2014 1014   ALBUMIN 3.6 08/31/2017 1108   ALBUMIN 4.3 07/14/2014 1014   AST 19 08/31/2017 1108   ALT 10 08/31/2017 1108   ALKPHOS 93 08/31/2017 1108   BILITOT 0.5 08/31/2017 1108   BILITOT 0.2 07/14/2014 1014   GFRNONAA 44 (L) 05/03/2017 1238   GFRAA 51 (L) 05/03/2017 1238   Lab Results  Component Value Date   CHOL 132 08/31/2017   HDL 68.90 08/31/2017   LDLCALC 54 08/31/2017   TRIG 42.0 08/31/2017   CHOLHDL 2 08/31/2017   Lab Results  Component Value Date   HGBA1C 5.7 (H) 04/09/2018   Lab Results  Component Value Date   VITAMINB12 307 07/14/2014   Lab Results  Component Value Date   TSH 2.80 02/26/2017      ASSESSMENT AND PLAN 73 y.o. year old female  has a past medical history of Anemia, Arthritis, Asthma, Chronic lower back pain, COPD (chronic obstructive pulmonary disease) ([place]), Degenerative arthritis, Fx ankle, GERD (gastroesophageal reflux disease), Gout (05/05/2013), Hypertension, Migraines, Myalgia and myositis, Myocardial infarction ([place]) (1992), Obesity, Osteoporosis, Shortness of breath, Sleep apnea, and Type II diabetes mellitus ([place]). here with:  1.  Migraine headaches  Overall she reports significant stress in her life. She recently has gained about 60 pounds in the last year.  She reports she is having 1-2 headaches per month.  She will take Tylenol and the headache will be gone within 1 hour.  She will continue taking nortriptyline 30 mg at bedtime.  She reports she does not need a refill at this time. We did discuss that we could potentially add/switch to Topamax.  Topamax may be beneficial for weight loss.  She reports she is currently starting to make some lifestyle changes to lose weight.  She does not wish to make any changes to her headache medications at this time.  She will follow-up in 6 months for CPAP and headache follow-up.  Her blood pressure was elevated today 163/76.  She  reports this is related to significant stress in her life.  She will continue to monitor this.  I advised her that if her symptoms worsen or she should develop any new symptoms she should let us know.   I spent 15 minutes with the patient. 50% of this time was spent discussing her plan of care   Evangeline Dakin, DNP 05/03/2018, 10:26 AM [facility] [address], [place], [place] [address] [phone]

## 2018-05-03 ENCOUNTER — Ambulatory Visit (INDEPENDENT_AMBULATORY_CARE_PROVIDER_SITE_OTHER): Payer: Medicare Other | Admitting: Neurology

## 2018-05-03 ENCOUNTER — Encounter: Payer: Self-pay | Admitting: Neurology

## 2018-05-03 VITALS — BP 163/76 | HR 77 | Ht 61.0 in | Wt 269.4 lb

## 2018-05-03 DIAGNOSIS — G43009 Migraine without aura, not intractable, without status migrainosus: Secondary | ICD-10-CM

## 2018-05-08 ENCOUNTER — Other Ambulatory Visit: Payer: Self-pay | Admitting: Nurse Practitioner

## 2018-05-08 DIAGNOSIS — J4521 Mild intermittent asthma with (acute) exacerbation: Secondary | ICD-10-CM

## 2018-05-10 NOTE — Telephone Encounter (Signed)
Will call the pt back around 4:30pm today, need to make sure pt needs this refill?

## 2018-05-12 LAB — HM DIABETES EYE EXAM

## 2018-05-15 ENCOUNTER — Encounter: Payer: Self-pay | Admitting: Nurse Practitioner

## 2018-05-25 ENCOUNTER — Encounter: Payer: Self-pay | Admitting: Nurse Practitioner

## 2018-05-25 NOTE — Progress Notes (Signed)
Abstracted result and sent to scan  

## 2018-06-17 ENCOUNTER — Other Ambulatory Visit: Payer: Self-pay | Admitting: Nurse Practitioner

## 2018-06-17 DIAGNOSIS — M1A00X0 Idiopathic chronic gout, unspecified site, without tophus (tophi): Secondary | ICD-10-CM

## 2018-06-17 DIAGNOSIS — M1A9XX0 Chronic gout, unspecified, without tophus (tophi): Secondary | ICD-10-CM

## 2018-06-22 ENCOUNTER — Telehealth: Payer: Self-pay | Admitting: Nurse Practitioner

## 2018-06-22 DIAGNOSIS — M1A00X0 Idiopathic chronic gout, unspecified site, without tophus (tophi): Secondary | ICD-10-CM

## 2018-06-22 DIAGNOSIS — M1A9XX0 Chronic gout, unspecified, without tophus (tophi): Secondary | ICD-10-CM

## 2018-06-22 NOTE — Telephone Encounter (Signed)
[employer] sent a message stating Ranitidine 15 mg as of 06/02/2018, FDA announced a voluntary withdrawal of all prescription and otc ranitidine drugs from the market due to ranitidin when stored at higher than room temp and may result in consumer exposure to unacceptable levels of this impurity. Please advise.

## 2018-06-23 MED ORDER — ALLOPURINOL 100 MG PO TABS: 100.0000 mg | ORAL_TABLET | Freq: Every day | ORAL | 2 refills | Status: DC

## 2018-06-23 NOTE — Telephone Encounter (Signed)
Pt is aware to discontinued ranitidine and start on Omeprazole 20 mg otc (added to med list as historical med).   Pt mention that she never got the allopurinol (ZYLOPRIM) 100 MG tablet. [facility] stated she told them she was not taking it as of 06/05/2018 so they deleted rx we sent in on 04/13/2018 (1 yrs supply) from their system but the pt told me she never got it because pharmacy still waiting for the authorization from [place].   Do you want the pt to continue taking allopurinol? If yes, ok to send in refill?

## 2018-06-23 NOTE — Telephone Encounter (Signed)
Rx sent and pt is aware.

## 2018-06-23 NOTE — Telephone Encounter (Signed)
Discontinue med an replace with omeprazole 20mg  OTC once daily

## 2018-06-23 NOTE — Telephone Encounter (Signed)
Yes resend allopurinol

## 2018-06-28 ENCOUNTER — Other Ambulatory Visit: Payer: Self-pay | Admitting: Adult Health

## 2018-06-28 NOTE — Telephone Encounter (Signed)
I received refill request for nortriptyline for migraine headache prevention. It looks as though Dr. Ella Bodo started tramadol 50 mg BID in December 2019 and had discontinued nortriptyline at that time. Can you call the patient and see if she is taking tramadol scheduled. With tramadol and nortriptyline there is a risk for serotonin syndrome. Please discuss with patient, we may need to switch to another headache medication.

## 2018-06-29 NOTE — Telephone Encounter (Signed)
I called the patient to discuss her nortriptyline.  She continues taking tramadol 50 mg twice a day for chronic pain.  There is risk of serotonin syndrome with tramadol and nortriptyline. She has currently been taking nortriptyline alternating between 30 mg at bedtime for 1 week, then decreasing her dose to 20 mg for for migraine prevention.  She reports that she is only having about 1-2 headaches a month.  She says that when they do happen they are very short lasting.  She wishes to continue taking her scheduled tramadol for chronic pain.  At this time we will see if she can come off the nortriptyline, see how her headaches do.  She will slowly taper off the nortriptyline, take 10 mg at bedtime for a week, then stop the medication.  She will call if her headaches worsen or she wishes to start another medication for migraine prevention.

## 2018-06-29 NOTE — Telephone Encounter (Signed)
I called pt.  She is taking tramadol 50mg  po bid and has been taking the nortriptyline 20mg  po qhs.  (instead of 30mg ). She stated she did not recall Dr. Letta Pate saying anything about stopping nortirptyline.  I stated that taking these 2 drugs together could cause mild to severe sx of serotonin sydrome. (Agitation or restlessness.Confusion.Rapid heart rate and Elevated Bp,Dilated pupils,Loss of muscle coordination or twitching muscles,Muscle rigidity.Heavy sweating.Diarrhea.) She stated that she has one eye twitching (at times),  swollen hands in am with arthritic pain).  I relayed that I will let SS/NP know and get back to her.  She verbalized understanding.

## 2018-07-09 ENCOUNTER — Ambulatory Visit: Payer: Medicare Other | Admitting: Nurse Practitioner

## 2018-07-11 ENCOUNTER — Other Ambulatory Visit: Payer: Self-pay | Admitting: Nurse Practitioner

## 2018-07-11 DIAGNOSIS — K219 Gastro-esophageal reflux disease without esophagitis: Secondary | ICD-10-CM

## 2018-07-12 ENCOUNTER — Encounter: Payer: Self-pay | Admitting: Nurse Practitioner

## 2018-07-12 ENCOUNTER — Ambulatory Visit (INDEPENDENT_AMBULATORY_CARE_PROVIDER_SITE_OTHER): Payer: Medicare Other | Admitting: Nurse Practitioner

## 2018-07-12 VITALS — Ht 61.0 in

## 2018-07-12 DIAGNOSIS — F4323 Adjustment disorder with mixed anxiety and depressed mood: Secondary | ICD-10-CM | POA: Diagnosis not present

## 2018-07-12 DIAGNOSIS — M79671 Pain in right foot: Secondary | ICD-10-CM | POA: Diagnosis not present

## 2018-07-12 DIAGNOSIS — I10 Essential (primary) hypertension: Secondary | ICD-10-CM | POA: Diagnosis not present

## 2018-07-12 DIAGNOSIS — M79672 Pain in left foot: Secondary | ICD-10-CM

## 2018-07-12 DIAGNOSIS — E1142 Type 2 diabetes mellitus with diabetic polyneuropathy: Secondary | ICD-10-CM | POA: Diagnosis not present

## 2018-07-12 DIAGNOSIS — F339 Major depressive disorder, recurrent, unspecified: Secondary | ICD-10-CM | POA: Insufficient documentation

## 2018-07-12 MED ORDER — PREDNISONE 20 MG PO TABS: ORAL_TABLET | ORAL | 0 refills | Status: AC

## 2018-07-12 NOTE — Progress Notes (Signed)
Virtual Visit via Telephone Note  I connected with Sandra Brennan on 07/12/18 at  1:30 PM EDT by telephone and verified that I am speaking with the correct person using two identifiers.  Location: Patient: Office (private space per patient) Provider: Office   I discussed the limitations, risks, security and privacy concerns of performing an evaluation and management service by telephone and the availability of in person appointments. I also discussed with the patient that there may be a patient responsible charge related to this service. The patient expressed understanding and agreed to proceed.  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 currently relying on prayers, spiritual guidance from pastor, support from friends and counselor at work. She is not interested in use on medication at this time. Depression screen [facility] 2/9 07/12/2018 04/09/2018 04/09/2018  Decreased Interest 0 2 0  Down, Depressed, Hopeless 1 1 0  PHQ - 2 Score 1 3 0  Altered sleeping 3 3 -  Tired, decreased energy 0 2 -  Change in appetite 3 0 -  Feeling bad or failure about yourself  1 0 -  Trouble concentrating 0 2 -  Moving slowly or fidgety/restless 0 0 -  Suicidal thoughts 0 0 -  PHQ-9 Score 8 10 -  Difficult doing work/chores - - -   GAD 7 : Generalized Anxiety Score 07/12/2018 04/09/2018  Nervous, Anxious, on Edge 1 2  Control/stop worrying 3 3  Worry too much - different things 3 2  Trouble relaxing 3 3  Restless 1 0  Easily annoyed or irritable 1 2  Afraid - awful  might happen 3 0  Total GAD 7 Score 15 12   Also report bilatral foot pain, chronic, but worsening in last 1months. Describes as shooting and numbness in ankle region and bottom of feet. Worse in morning with first steps. Has chronic LE edema(no change) Current use of tramadol and lyrica daily. Prescribed by Dr. Read Drivers. Denies any claudication, no erythema, no injury.  Observations/Objective: Alert and oriented, normal speech and voice tone. No vital signs provided.  Assessment and Plan: Sandra Brennan was seen today for follow-up and pain.  Diagnoses and all orders for this visit:  Essential hypertension  Foot pain, bilateral -     predniSONE (DELTASONE) 20 MG tablet; Take 2 tablets (40 mg total) by mouth daily with breakfast for 1 day, THEN 1.5 tablets (30 mg total) daily with breakfast for 2 days, THEN 1 tablet (20 mg total) daily with breakfast for 2 days, THEN 0.5 tablets (10 mg total) daily with breakfast for 2 days. -     Ambulatory referral to Podiatry  Type 2 diabetes mellitus with diabetic polyneuropathy, without long-term current use of insulin (HCC)  Adjustment disorder with mixed anxiety and depressed mood   Follow Up Instructions: Maintain lyrica and tramadol,  f/up with Dr. Read Drivers. You will be contacted to schedule appt with podiatry. Hold symbicort while taking oral prednisone. F/up in office in 24month   I spent 51mins during telephone call talking about different local support  resources for family members of substance abuse individuals. Advised to contact me if needs additional help with education and/or counseling. She decided to maintain current support system: pastor, friends and counselor at work.  I discussed the assessment and treatment plan with the patient. The patient was provided an opportunity to ask questions and all were answered. The patient agreed with the plan and demonstrated an understanding of the instructions.   The patient was advised to call  back or seek an in-person evaluation if the symptoms worsen or if the condition fails to improve as anticipated.  I provided 25 minutes of non-face-to-face time during this encounter.   Wilfred Lacy, NP

## 2018-07-12 NOTE — Patient Instructions (Signed)
Supporting Someone With Substance Use Disorder Having substance use disorder means that a person's repeated drug or alcohol use interferes with his or her ability to be productive. Alcohol or drug use may interfere with relationships and everyday activities, such as work or school. When a person has substance disorder, his or her condition can affect others around him or her, such as friends and family members. Friends and family can help by offering support and understanding. What do I need to know about this condition? Substance use disorder can cause problems with mental and physical health. It can affect a person's ability to have healthy relationships and to meet responsibilities at home and at work or school. It can also lead to addiction. Symptoms associated with substance use disorder include:  Using a substance more than is normal.  Craving the substance or always thinking about it.  Trouble stopping substance use.  Spending a significant amount of time getting the substance, using it, or recovering from its effects.  Needing more and more of the substance to get the same effect (developing a tolerance).  Experiencing consequences of substance use, such as: ? Poor performance at work or school. ? Relationship problems. ? Financial or legal problems. ? Health problems. The most commonly abused substances include:  Alcohol.  Tobacco.  Marijuana.  Stimulants, such as cocaine and methamphetamine.  Hallucinogens, such as LSD and PCP.  Opioids, such as some prescription pain medicines and heroin. What do I need to know about the treatment options? Treatment for substance use disorder and recovery can be a long process. Your loved one's treatment may involve:  Stopping substance use safely. This may require taking medicines and being closely observed for several days.  Group or individual counseling from mental health providers. Your loved one may attend daily counseling  sessions at a treatment center.  Staying at a residential treatment center for several days or weeks.  Going to a support group. These groups are an important part of long-term recovery for many people. They include 12-step programs like Alcoholics Anonymous (AA) and Narcotics Anonymous (NA). Many people who undergo treatment start using the substance again after stopping. This is called a relapse. If your loved one has a relapse, that does not mean that treatment will not work. Keep in mind that:  This disorder involves the brain, the body, and the people in a person's life (social system). Changing unhealthy behaviors is a complicated process that requires determination from your loved one.  Your loved one may need to try several times to recover.  Your support is important in helping your loved one to recover. A responsible adult may need to stay with your loved one for some time after treatment. This person can help your loved one stay on track with recovery and can watch for symptoms that are getting worse. How can I support my loved one? Talk about the condition  Be careful about too much prodding. Try not to overdo reminders to an adult friend or family member about things like taking medicines. Ask how your loved one prefers that you help.  Explain that it is not easy to quit because substance use can change the part of the brain that gives someone self-control. Also, some people can easily become addicted because of their family genes.  Never ignore comments about suicide, and do not try to avoid the subject of suicide. Talking about suicide will not make your loved one want to act on it. You or your loved one  can reach out 24 hours a day to get free, private support (on the phone or a live online chat) from a suicide crisis helpline, such as the [facility] at (828) 199-3842.  Ask your loved one if you can go with him or her to meet with his or her health care  provider or therapist. Ask if your loved one is open to giving you written permission to communicate with his or her providers if your loved one has problems. Find support and resources  Work with a health care provider who specializes in substance use disorders. Your loved one's primary care provider may be able to recommend a provider.  Refer your loved one to trusted online resources that can provide information about substance use disorders. A health care provider may be able to recommend resources. You could start with: ? Government sites such as the Substance Abuse and Information systems manager [facility]): ktimeonline.com ? National mental health organizations such as the [employer] on [address] ([place]): www.nami.org  Look into local support groups or 12-step programs for your loved one.  Connect with people in peer and family support groups. People in these groups understand what you and your loved one are going through. They can help you feel a sense of comfort and connect you with local resources to help you learn more. General support  Make an effort to learn all you can about substance use disorder.  Help your loved one follow his or her treatment plan as directed by health care providers. This could mean driving him or her to therapy sessions or support group meetings.  Tell your loved one that you will keep giving support as long as he or she follows the treatment plan.  Assure your loved one that even though treatment may be hard, it often works. Substance use disorder can be managed.  Encourage your loved one to avoid things, people, and situations (triggers) that may cause a relapse.  Talk with your loved one's treatment center staff or health care provider about how you can keep supporting your loved one during treatment, recovery, and relapses if necessary. How can I create a safe environment?  Talk with your loved one's health care provider about ways  to lower the risk of harm. Based on the type of substance that your loved one is struggling with, his or her health care provider may recommend safety measures such as: ? Vaccinations. ? Medicines to prevent death from overdose. ? Referrals for a clean needle exchange program. ? Sexual health counseling.  If you believe that your loved one is driving while using drugs or alcohol, it is important to confront him or her about the dangers of driving while drunk or high. In some cases, you may need to call the police to prevent harm to your loved one or others. How should I care for myself? It is important to find ways to care for your body, mind, and well-being while supporting someone with substance use disorder.  Join a support group for family members of addicts. Your loved one's treatment center may offer family support groups and other programs.  Consider individual therapy to help you learn to cope with your loved one's disorder.  Try to maintain your normal routines. This can help you remember that your life is about more than your loved one's condition.  Make time for activities that help you relax, and try to not feel guilty about taking time for yourself.  Be clear about  limits and boundaries, especially if your loved one's behavior affects your well-being. Say "no" to requests or events that lead to a schedule that is too busy.  Eat a healthy diet, exercise regularly, and get plenty of sleep. What are some signs that the condition is getting worse? Signs that your loved one's condition may be getting worse include:  New or more frequent symptoms.  Continuing to use more and more of the substance over time.  Continuing to use the substance even after using it has had negative consequences.  Denying that he or she has a problem.  Blaming you or others for his or her use.  Missing work or school or important events.  Physical symptoms that are associated with continued  substance use.  A feeling in you that you are powerless to help your loved one get better. Get help right away if:  Your loved one is showing signs that he or she is thinking about hurting himself, herself, or someone else. If you ever feel like your loved one may hurt himself or herself or others, or may have thoughts about taking his or her own life, get help right away. You can go to your nearest emergency department or call:  Your local emergency services (911 in the [place]  A suicide crisis helpline, such as the [place] at 717-366-4821. This is open 24 hours a day. Summary  Having substance use disorder means that a person's repeated drug or alcohol use interferes with his or her ability to be productive.  Substance use disorder can be treated with therapy, group counseling, medicine, or staying at a residential treatment center.  Support from close friends and family members is vital for your loved one to overcome substance use disorder. The support that you provide can help your loved one through the tough treatment and recovery process.  It is important to find ways to care for yourself while supporting someone with substance use disorder. This information is not intended to replace advice given to you by your health care provider. Make sure you discuss any questions you have with your health care provider. Document Released: 07/01/2016 Document Revised: 07/01/2016 Document Reviewed: 07/01/2016 Elsevier Interactive Patient Education  2019 [employer].

## 2018-07-12 NOTE — Assessment & Plan Note (Signed)
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 currently relying on prayers, spiritual guidance from pastor, support from friends and counselor at work. She is not interested in use on medication at this time.  I spent 9mins during telephone call talking about different local support resources for family members of substance abuse individuals. Advised to contact me if needs additional help with education and/or counseling. She decided to maintain current support system: pastor, friends and counselor at work.

## 2018-08-02 ENCOUNTER — Encounter: Payer: Self-pay | Admitting: Registered Nurse

## 2018-08-02 ENCOUNTER — Encounter: Payer: Medicare Other | Attending: Registered Nurse | Admitting: Registered Nurse

## 2018-08-02 ENCOUNTER — Other Ambulatory Visit: Payer: Self-pay

## 2018-08-02 VITALS — BP 125/71 | HR 79 | Temp 98.4°F | Ht 61.0 in | Wt 266.0 lb

## 2018-08-02 DIAGNOSIS — M7062 Trochanteric bursitis, left hip: Secondary | ICD-10-CM

## 2018-08-02 DIAGNOSIS — M797 Fibromyalgia: Secondary | ICD-10-CM | POA: Insufficient documentation

## 2018-08-02 DIAGNOSIS — M19171 Post-traumatic osteoarthritis, right ankle and foot: Secondary | ICD-10-CM | POA: Insufficient documentation

## 2018-08-02 DIAGNOSIS — G894 Chronic pain syndrome: Secondary | ICD-10-CM | POA: Insufficient documentation

## 2018-08-02 DIAGNOSIS — Z79899 Other long term (current) drug therapy: Secondary | ICD-10-CM | POA: Insufficient documentation

## 2018-08-02 DIAGNOSIS — M47816 Spondylosis without myelopathy or radiculopathy, lumbar region: Secondary | ICD-10-CM | POA: Diagnosis present

## 2018-08-02 DIAGNOSIS — Z5181 Encounter for therapeutic drug level monitoring: Secondary | ICD-10-CM

## 2018-08-02 DIAGNOSIS — M25512 Pain in left shoulder: Secondary | ICD-10-CM | POA: Insufficient documentation

## 2018-08-02 MED ORDER — TRAMADOL HCL 50 MG PO TABS: 50.0000 mg | ORAL_TABLET | Freq: Two times a day (BID) | ORAL | 5 refills | Status: DC

## 2018-08-02 MED ORDER — PREGABALIN 100 MG PO CAPS: 100.0000 mg | ORAL_CAPSULE | Freq: Two times a day (BID) | ORAL | 5 refills | Status: DC

## 2018-08-02 NOTE — Progress Notes (Signed)
Subjective:    Patient ID: Sandra Brennan, female    DOB: 1945-10-13, 73 y.o.   MRN: [id number]  HPI: Sandra Brennan is a 73 y.o. female who returns for follow up appointment for chronic pain and medication refill. She states her pain is located in her left shoulder,lower back with activity and bilateral hip pain. She rates her pain 8. Her current exercise regime is walking.  Sandra Brennan reports two weeks ago she had a fall, she became dizzy lost her balance and hit her left shoulder against chair. She denies falling on the floor and didn't seek medical attention. Educated on falls prevention, she verbalizes understanding. Also instructed to F/U with her PCP regarding the above, she verbalizes understanding.   Sandra Brennan Morphine equivalent is 10.00 MME.  Oral Swab performed today.   Pain Inventory Average Pain 8 Pain Right Now 8 My pain is constant, sharp, stabbing and aching  In the last 24 hours, has pain interfered with the following? General activity 0 Relation with others 0 Enjoyment of life 0 What TIME of day is your pain at its worst? daytime Sleep (in general) Good  Pain is worse with: walking, sitting and standing Pain improves with: rest and medication Relief from Meds: 6  Mobility walk with assistance use a cane use a walker ability to climb steps?  no do you drive?  no  Function retired  Neuro/Psych weakness numbness tingling trouble walking spasms  Prior Studies Any changes since last visit?  no  Physicians involved in your care Any changes since last visit?  no   Family History  Problem Relation Age of Onset  . Arthritis Mother   . Arthritis Father   . Colon cancer Father   . Diabetes Sister   . Arthritis Sister   . Diabetes Maternal Grandmother   . Arthritis Maternal Grandmother   . Diabetes Maternal Grandfather   . Arthritis Maternal Grandfather    Social History   Socioeconomic History  . Marital status: Widowed    Spouse name: Not  on file  . Number of children: 5  . Years of education: PhD  . Highest education level: Not on file  Occupational History  . Occupation: Retired  Scientific laboratory technician  . Financial resource strain: Not on file  . Food insecurity:    Worry: Not on file    Inability: Not on file  . Transportation needs:    Medical: Not on file    Non-medical: Not on file  Tobacco Use  . Smoking status: Former Smoker    Packs/day: 0.12    Years: 2.00    Pack years: 0.24    Types: Cigarettes  . Smokeless tobacco: Never Used  . Tobacco comment: 08/03/2012 "quit 30 years ago"  Substance and Sexual Activity  . Alcohol use: No    Alcohol/week: 0.0 standard drinks    Comment: 08/03/2012 "used to be a beeralcoholic"; stopped ~ 30 yr ago"  . Drug use: No  . Sexual activity: Never  Lifestyle  . Physical activity:    Days per week: Not on file    Minutes per session: Not on file  . Stress: Not on file  Relationships  . Social connections:    Talks on phone: Not on file    Gets together: Not on file    Attends religious service: Not on file    Active member of club or organization: Not on file    Attends meetings of clubs or organizations: Not  on file    Relationship status: Not on file  Other Topics Concern  . Not on file  Social History Narrative   Lives at home with her mother and caregiver.   Occasional use of caffeine.   Right-handed.   Past Surgical History:  Procedure Laterality Date  . ABDOMINAL HYSTERECTOMY  1990's  . GANGLION CYST EXCISION Right 1980's   "wrist" (2012/08/31)  . JOINT REPLACEMENT    . KNEE LIGAMENT RECONSTRUCTION Right 1980's  . TONSILLECTOMY  ~ 1954  . TOTAL HIP ARTHROPLASTY Left 2012-08-31  . TOTAL HIP ARTHROPLASTY Left 08/31/2012   Procedure: TOTAL HIP ARTHROPLASTY;  Surgeon: Garald Balding, MD;  Location: [facility];  Service: Orthopedics;  Laterality: Left;  Left Total Hip Arthroplasty  . TOTAL HIP ARTHROPLASTY Left 08/28/2012   Procedure: Irrigation and Debridement hip ;   Surgeon: Mcarthur Rossetti, MD;  Location: [facility];  Service: Orthopedics;  Laterality: Left;  . TOTAL KNEE ARTHROPLASTY Left 2001  . TOTAL KNEE ARTHROPLASTY Right 2004   Past Medical History:  Diagnosis Date  . Anemia   . Arthritis    "plenty" (08/31/2012)  . Asthma   . Chronic lower back pain    "they say I need a whole new spinal column" (08-31-2012)  . COPD (chronic obstructive pulmonary disease) ([place])   . Degenerative arthritis    "all over" (31-Aug-2012)  . Fx ankle   . GERD (gastroesophageal reflux disease)   . Gout 05/05/2013  . Hypertension   . Migraines    "used to; totally stopped when I quit drinking 30 yr ago" (08-31-12)  . Myalgia and myositis   . Myocardial infarction [facility]) 1992   2012/08/31 "mild MI when son died "  . Obesity   . Osteoporosis    "all over" (Aug 31, 2012)  . Shortness of breath    when stressed.  . Sleep apnea    "never did fix my machine; haven't used one for 5 years; I've lost 116# since then; no problems now" (Aug 31, 2012)  . Type II diabetes mellitus ([place])    "borderline; don't test; take Metformin" (2012/08/31)   BP 125/71   Pulse 79   Temp 98.4 F (36.9 C)   Ht 5\' 1"  (1.549 m)   Wt 266 lb (120.7 kg)   SpO2 95%   BMI 50.26 kg/m   Opioid Risk Score:   Fall Risk Score:  `1  Depression screen PHQ 2/9  Depression screen [facility] 2/9 07/12/2018 04/09/2018 04/09/2018 04/01/2017 02/26/2017 02/02/2017 06/09/2016  Decreased Interest 0 2 0 0 0 0 3  Down, Depressed, Hopeless 1 1 0 0 0 0 0  PHQ - 2 Score 1 3 0 0 0 0 3  Altered sleeping 3 3 - - - - 2  Tired, decreased energy 0 2 - - - - 3  Change in appetite 3 0 - - - - 3  Feeling bad or failure about yourself  1 0 - - - - 0  Trouble concentrating 0 2 - - - - 0  Moving slowly or fidgety/restless 0 0 - - - - 0  Suicidal thoughts 0 0 - - - - 0  PHQ-9 Score 8 10 - - - - 11  Difficult doing work/chores - - - - - - Somewhat difficult     Review of Systems  Constitutional: Negative.   HENT: Negative.   Eyes:  Negative.   Respiratory: Positive for apnea.   Cardiovascular: Negative.   Gastrointestinal: Negative.   Endocrine: Negative.  Genitourinary: Negative.   Musculoskeletal: Positive for arthralgias, back pain, gait problem and myalgias.  Skin: Negative.   Allergic/Immunologic: Negative.   Neurological: Positive for numbness.  Hematological: Negative.   Psychiatric/Behavioral: Negative.   All other systems reviewed and are negative.      Objective:   Physical Exam Vitals signs and nursing note reviewed.  Constitutional:      Appearance: Normal appearance.     Comments: Morbid Obesity  Neck:     Musculoskeletal: Normal range of motion and neck supple.  Cardiovascular:     Rate and Rhythm: Normal rate and regular rhythm.     Pulses: Normal pulses.     Heart sounds: Normal heart sounds.  Pulmonary:     Effort: Pulmonary effort is normal.     Breath sounds: Normal breath sounds.  Musculoskeletal:     Comments: Normal Muscle Bulk and Muscle Testing Reveals:  Upper Extremities: Right: Decreased ROM 90 Degrees  and Muscle Strength 4/5  Left: Decreased ROM 45 Degrees and Muscle Strength 4/5 Left AC Joint tenderness  Thoracic Paraspinal Tenderness: T-1-T-3 Mainly Left Side Mainly Left Side Lumbar Hypersensitivity Lower Extremities: Decreased ROM and Muscle Strength 4/5 Arises from Table Slowly using cane for support Antalgic gait  Skin:    General: Skin is warm and dry.  Neurological:     Mental Status: She is alert and oriented to person, place, and time.           Assessment & Plan:  1. Acute Left Shoulder Pain: Refuses X-ray. Continue to Monitor.  2. Lumbar Spondylosis without Myelopathy: Continue Lyrica. Continue to Monitor. 08/02/2018 3. Right Ankle Osteoarthritis: Refilled: Tramadol 50 mg one tablet BID as needed for pain #60. Marland Kitchen Continue to monitor.  We will continue the opioid monitoring program, this consists of regular clinic visits, examinations, urine drug  screen, pill counts as well as use of [place] Controlled Substance Reporting system. 4. Fibromylagia Syndrome: Continue Lyrica. Continue to Monitor. 08/02/2018  20 Minutes of face to face patient care time visit. All questions were encouraged and answered.   F/U in 6 months with Dr. Letta Pate

## 2018-08-05 LAB — DRUG TOX MONITOR 1 W/CONF, ORAL FLD

## 2018-08-05 LAB — DRUG TOX ALC METAB W/CON, ORAL FLD: Alcohol Metabolite: NEGATIVE ng/mL (ref <25)

## 2018-08-06 ENCOUNTER — Telehealth: Payer: Self-pay | Admitting: *Deleted

## 2018-08-06 NOTE — Telephone Encounter (Signed)
Oral swab drug screen was consistent for prescribed medications.  ?

## 2018-08-12 ENCOUNTER — Ambulatory Visit: Payer: Medicare Other | Admitting: Nurse Practitioner

## 2018-08-13 ENCOUNTER — Encounter: Payer: Self-pay | Admitting: Nurse Practitioner

## 2018-08-13 ENCOUNTER — Ambulatory Visit (INDEPENDENT_AMBULATORY_CARE_PROVIDER_SITE_OTHER): Payer: Medicare Other | Admitting: Nurse Practitioner

## 2018-08-13 VITALS — BP 120/70 | HR 70 | Temp 98.0°F | Ht 61.0 in | Wt 270.2 lb

## 2018-08-13 DIAGNOSIS — E1142 Type 2 diabetes mellitus with diabetic polyneuropathy: Secondary | ICD-10-CM

## 2018-08-13 DIAGNOSIS — I10 Essential (primary) hypertension: Secondary | ICD-10-CM

## 2018-08-13 DIAGNOSIS — E782 Mixed hyperlipidemia: Secondary | ICD-10-CM | POA: Diagnosis not present

## 2018-08-13 NOTE — Patient Instructions (Addendum)
Go to lab for blood draw.  Use cane with 4throng for better stability when walking. Change position slowly.  Please discuss pain management with Dr. Elnita Maxwell. Let me know if you change your mind about referral to rheumatology.

## 2018-08-13 NOTE — Progress Notes (Signed)
Subjective:  Patient ID: Sandra Brennan, female    DOB: 03-May-1945  Age: 73 y.o. MRN: [id number]  CC: Follow-up (follow up on HTN and DM--fasting--mention insec bite on left upper inner arm?) and Pain (pt is c/o left side neck soreness,toward the left arm, joints on shouders lock up at times.--2 wks--took otc cream)  HPI  HTN: Stable with lisinopril/HCTZ BP Readings from Last 3 Encounters:  08/13/18 120/70  08/02/18 125/71  05/03/18 (!) 163/76   DM: Last hgbA1c of 5.7 Diet controlled at this time  Insect bite 2days ago while working in her yard, itching and painful.  Chronic joint pain (multiple): Neck pain, shoulders, back, knees, and ankle, chronic, worsening in last 1 month, associated with Joint stiffness and limited ROM. Last seen by Dr. Elnita Brennan last week, use of lyrica and tramadol with moderate relief. Improved foot pain with oral prednisone. Denies any fall or injury in last 12months. Use of cane for stability.  Reviewed past Medical, Social and Family history today.  Outpatient Medications Prior to Visit  Medication Sig Dispense Refill  . albuterol (PROAIR HFA) 108 (90 Base) MCG/ACT inhaler Inhale 1-2 puffs into the lungs every 6 (six) hours as needed for wheezing or shortness of breath. 1 Inhaler 5  . allopurinol (ZYLOPRIM) 100 MG tablet Take 1 tablet (100 mg total) by mouth daily. 90 tablet 2  . aspirin EC 81 MG tablet Take 81 mg by mouth daily.    Marland Kitchen atorvastatin (LIPITOR) 10 MG tablet Take 1 tablet (10 mg total) by mouth daily at 6 PM. 90 tablet 3  . budesonide-formoterol (SYMBICORT) 160-4.5 MCG/ACT inhaler Inhale 1 puff into the lungs 2 (two) times daily for 30 days. 1 Inhaler 0  . Carboxymethylcellulose Sodium (EYE DROPS OP) Apply to eye. Lubricant eye drops    . cetirizine (ZYRTEC) 10 MG tablet Take 10 mg by mouth daily.    . cholecalciferol (VITAMIN D) 1000 units tablet Take 1 tablet (1,000 Units total) by mouth daily. (Patient taking differently: Take 1,000  Units by mouth 2 (two) times daily. )    . cycloSPORINE (RESTASIS) 0.05 % ophthalmic emulsion Restasis 0.05 % eye drops in a dropperette    . lisinopril-hydrochlorothiazide (ZESTORETIC) 10-12.5 MG tablet Take 1 tablet by mouth daily. 90 tablet 3  . OVER THE COUNTER MEDICATION Cystic magnesium    . pregabalin (LYRICA) 100 MG capsule Take 1 capsule (100 mg total) by mouth 2 (two) times daily. 60 capsule 5  . Sennosides 8.6 MG CAPS Take by mouth.    . traMADol (ULTRAM) 50 MG tablet Take 1 tablet (50 mg total) by mouth 2 (two) times daily. 60 tablet 5  . omeprazole (PRILOSEC) 20 MG capsule Take 1 capsule (20 mg total) by mouth daily. 1 capsule 0   No facility-administered medications prior to visit.     ROS See HPI  Objective:  BP 120/70   Pulse 70   Temp 98 F (36.7 C) (Oral)   Ht 5\' 1"  (1.549 m)   Wt 270 lb 3.2 oz (122.6 kg)   SpO2 96%   BMI 51.05 kg/m   BP Readings from Last 3 Encounters:  08/13/18 120/70  08/02/18 125/71  05/03/18 (!) 163/76    Wt Readings from Last 3 Encounters:  08/13/18 270 lb 3.2 oz (122.6 kg)  08/02/18 266 lb (120.7 kg)  05/03/18 269 lb 6.4 oz (122.2 kg)   Physical Exam Vitals signs reviewed.  Cardiovascular:     Rate and Rhythm: Normal rate  and regular rhythm.  Pulmonary:     Effort: Pulmonary effort is normal.     Breath sounds: Normal breath sounds.  Musculoskeletal:        General: Deformity present.     Right shoulder: She exhibits decreased range of motion, tenderness and decreased strength. She exhibits no effusion.     Left shoulder: She exhibits decreased range of motion, tenderness and decreased strength. She exhibits no effusion.     Right lower leg: Edema present.     Left lower leg: Edema present.     Comments: Bilateral fingers with heberden and bouchard nodes. Hypertrophy of left ankle with limited ROM. Ambulates with cane Slow getting out of chair.  Skin:    Findings: No erythema.          Comments: Abnormal foot exam  (decreased microfilament sensation)  Neurological:     Mental Status: She is alert and oriented to person, place, and time.  Psychiatric:        Mood and Affect: Mood normal.        Behavior: Behavior normal.        Thought Content: Thought content normal.     Lab Results  Component Value Date   WBC 6.4 05/03/2017   HGB 11.4 (L) 05/03/2017   HCT 34.7 (L) 05/03/2017   PLT 219 05/03/2017   GLUCOSE 85 04/09/2018   CHOL 132 08/31/2017   TRIG 42.0 08/31/2017   HDL 68.90 08/31/2017   LDLCALC 54 08/31/2017   ALT 10 08/31/2017   AST 19 08/31/2017   NA 142 04/09/2018   K 4.1 04/09/2018   CL 106 04/09/2018   CREATININE 1.39 (H) 04/09/2018   BUN 18 04/09/2018   CO2 26 04/09/2018   TSH 2.80 02/26/2017   INR 1.17 08/27/2012   HGBA1C 5.7 (H) 04/09/2018   MICROALBUR <0.7 08/31/2017    Dg Chest 2 View  Result Date: 05/03/2017 CLINICAL DATA:  Chest pain. EXAM: CHEST  2 VIEW COMPARISON:  Radiographs of March 30, 2016. FINDINGS: The heart size and mediastinal contours are within normal limits. Both lungs are clear. No pneumothorax or pleural effusion is noted. The visualized skeletal structures are unremarkable. IMPRESSION: No active cardiopulmonary disease. Electronically Signed   By: Sandra Brennan, M.D.   On: 05/03/2017 14:15    Assessment & Plan:   Sandra Brennan was seen today for follow-up and pain.  Diagnoses and all orders for this visit:  Essential hypertension -     Cancel: CBC -     CBC; Future -     Comprehensive metabolic panel; Future -     Hemoglobin A1c; Future -     Lipid panel; Future  Type 2 diabetes mellitus with diabetic polyneuropathy, without long-term current use of insulin (HCC) -     Cancel: Comprehensive metabolic panel -     Cancel: Lipid panel -     Cancel: Hemoglobin A1c -     CBC; Future -     Comprehensive metabolic panel; Future -     Hemoglobin A1c; Future -     Lipid panel; Future  Mixed hyperlipidemia -     Cancel: Comprehensive metabolic  panel -     CBC; Future -     Comprehensive metabolic panel; Future -     Hemoglobin A1c; Future -     Lipid panel; Future   I am having Sandra Brennan. Sandra Brennan maintain her aspirin EC, Sennosides, cycloSPORINE, cholecalciferol, albuterol, budesonide-formoterol, atorvastatin, lisinopril-hydrochlorothiazide, omeprazole, allopurinol, Carboxymethylcellulose Sodium (  EYE DROPS OP), cetirizine, OVER THE COUNTER MEDICATION, traMADol, and pregabalin.  No orders of the defined types were placed in this encounter.   Problem List Items Addressed This Visit      Cardiovascular and Mediastinum   Hypertension - Primary (Chronic)   Relevant Orders   CBC   Comprehensive metabolic panel   Hemoglobin A1c   Lipid panel     Endocrine   Type 2 diabetes mellitus with diabetic polyneuropathy, without long-term current use of insulin (HCC) (Chronic)   Relevant Orders   CBC   Comprehensive metabolic panel   Hemoglobin A1c   Lipid panel     Other   Hyperlipidemia   Relevant Orders   CBC   Comprehensive metabolic panel   Hemoglobin A1c   Lipid panel       Follow-up: Return in about 6 months (around 02/12/2019) for DM and HTN, hyperlipidemia.  Wilfred Lacy, NP

## 2018-08-16 ENCOUNTER — Encounter: Payer: Self-pay | Admitting: Nurse Practitioner

## 2018-08-31 ENCOUNTER — Other Ambulatory Visit: Payer: Self-pay

## 2018-08-31 ENCOUNTER — Ambulatory Visit (INDEPENDENT_AMBULATORY_CARE_PROVIDER_SITE_OTHER): Payer: Medicare Other

## 2018-08-31 ENCOUNTER — Other Ambulatory Visit: Payer: Self-pay | Admitting: Sports Medicine

## 2018-08-31 ENCOUNTER — Encounter: Payer: Self-pay | Admitting: Sports Medicine

## 2018-08-31 ENCOUNTER — Ambulatory Visit (INDEPENDENT_AMBULATORY_CARE_PROVIDER_SITE_OTHER): Payer: Medicare Other | Admitting: Sports Medicine

## 2018-08-31 VITALS — Temp 97.3°F

## 2018-08-31 DIAGNOSIS — M19072 Primary osteoarthritis, left ankle and foot: Secondary | ICD-10-CM | POA: Diagnosis not present

## 2018-08-31 DIAGNOSIS — M19071 Primary osteoarthritis, right ankle and foot: Secondary | ICD-10-CM | POA: Diagnosis not present

## 2018-08-31 DIAGNOSIS — M79672 Pain in left foot: Secondary | ICD-10-CM

## 2018-08-31 DIAGNOSIS — M7751 Other enthesopathy of right foot: Secondary | ICD-10-CM

## 2018-08-31 DIAGNOSIS — M7750 Other enthesopathy of unspecified foot: Secondary | ICD-10-CM

## 2018-08-31 DIAGNOSIS — M19079 Primary osteoarthritis, unspecified ankle and foot: Secondary | ICD-10-CM

## 2018-08-31 DIAGNOSIS — M79671 Pain in right foot: Secondary | ICD-10-CM

## 2018-08-31 DIAGNOSIS — E1142 Type 2 diabetes mellitus with diabetic polyneuropathy: Secondary | ICD-10-CM

## 2018-08-31 MED ORDER — CELECOXIB 200 MG PO CAPS: 200.0000 mg | ORAL_CAPSULE | Freq: Two times a day (BID) | ORAL | 0 refills | Status: DC

## 2018-08-31 MED ORDER — TRIAMCINOLONE ACETONIDE 10 MG/ML IJ SUSP
10.0000 mg | Freq: Once | INTRAMUSCULAR | Status: AC
  Administered 2018-08-31: 14:00:00 10 mg

## 2018-08-31 NOTE — Progress Notes (Signed)
Subjective:  Sandra Brennan is a 73 y.o. female patient who presents to office for evaluation of R>L ankle pain. Patient complains of continued pain in the ankle. Patient has tried soaking and topical pain cream with no relief in symptoms. Admits history of falls and fracture on right last year and has had pain since on right and for the last 3 months on the left. Feels sore and worse on right ankle 7-8/10 Patient denies any other pedal complaints.   FBS this AM not recorded, last A1c 5.7 per chart   Review of Systems  Musculoskeletal: Positive for falls, joint pain and myalgias.  All other systems reviewed and are negative.    Patient Active Problem List   Diagnosis Date Noted  . Adjustment disorder with mixed anxiety and depressed mood 07/12/2018  . Migraine without aura and without status migrainosus, not intractable 09/10/2017  . Post-traumatic osteoarthritis of right ankle 06/09/2016  . Spondylosis without myelopathy or radiculopathy, lumbar region 06/09/2016  . Cervical spinal stenosis 06/09/2016  . Spinal stenosis, lumbar region, without neurogenic claudication 06/09/2016  . Chronic gouty arthritis 06/04/2016  . Vitamin D deficiency 06/02/2016  . Hyperlipidemia 06/02/2016  . Closed right ankle fracture 05/01/2016  . Colon polyps 05/01/2016  . Fibromyalgia syndrome 05/01/2016  . Syncope 03/30/2016  . Chest pain 03/30/2016  . Fall 03/30/2016  . Gastroesophageal reflux disease without esophagitis 06/21/2015  . Pharyngeal dysphagia 06/21/2015  . Obesity 05/06/2013  . Acute nontraumatic kidney injury ([place]) 05/05/2013  . Acute posthemorrhagic anemia 09/06/2012  . Gout 09/05/2012  . Osteoarthritis of left hip 08/05/2012  . Type 2 diabetes mellitus with diabetic polyneuropathy, without long-term current use of insulin ([place]) 08/05/2012  . Hypertension 08/05/2012  . Asthma, chronic 08/05/2012  . Sleep apnea 08/05/2012    Current Outpatient Medications on File Prior to Visit   Medication Sig Dispense Refill  . albuterol (PROAIR HFA) 108 (90 Base) MCG/ACT inhaler Inhale 1-2 puffs into the lungs every 6 (six) hours as needed for wheezing or shortness of breath. 1 Inhaler 5  . allopurinol (ZYLOPRIM) 100 MG tablet Take 1 tablet (100 mg total) by mouth daily. 90 tablet 2  . aspirin EC 81 MG tablet Take 81 mg by mouth daily.    Marland Kitchen atorvastatin (LIPITOR) 10 MG tablet Take 1 tablet (10 mg total) by mouth daily at 6 PM. 90 tablet 3  . Carboxymethylcellulose Sodium (EYE DROPS OP) Apply to eye. Lubricant eye drops    . cetirizine (ZYRTEC) 10 MG tablet Take 10 mg by mouth daily.    . cholecalciferol (VITAMIN D) 1000 units tablet Take 1 tablet (1,000 Units total) by mouth daily. (Patient taking differently: Take 1,000 Units by mouth 2 (two) times daily. )    . cycloSPORINE (RESTASIS) 0.05 % ophthalmic emulsion Restasis 0.05 % eye drops in a dropperette    . lisinopril-hydrochlorothiazide (ZESTORETIC) 10-12.5 MG tablet Take 1 tablet by mouth daily. 90 tablet 3  . omeprazole (PRILOSEC) 20 MG capsule Take 1 capsule (20 mg total) by mouth daily. 1 capsule 0  . OVER THE COUNTER MEDICATION Cystic magnesium    . pregabalin (LYRICA) 100 MG capsule Take 1 capsule (100 mg total) by mouth 2 (two) times daily. 60 capsule 5  . Sennosides 8.6 MG CAPS Take by mouth.    . traMADol (ULTRAM) 50 MG tablet Take 1 tablet (50 mg total) by mouth 2 (two) times daily. 60 tablet 5  . budesonide-formoterol (SYMBICORT) 160-4.5 MCG/ACT inhaler Inhale 1 puff into the  lungs 2 (two) times daily for 30 days. 1 Inhaler 0   No current facility-administered medications on file prior to visit.     Allergies  Allergen Reactions  . Cymbalta [Duloxetine Hcl] Anaphylaxis  . Pineapple Shortness Of Breath  . Influenza Vaccines Hives and Itching  . Penicillins Hives  . Sulfa Antibiotics Other (See Comments)    REACTION: unknown  . Theophyllines Hives    Objective:  General: Alert and oriented x3 in no acute  distress  Dermatology: No open lesions bilateral lower extremities, no webspace macerations, no ecchymosis bilateral, all nails x 10 are short and thick.   Vascular: Dorsalis Pedis and Posterior Tibial pedal pulses palpable, Capillary Fill Time 3 seconds,(+) pedal hair growth bilateral, no edema bilateral lower extremities, Temperature gradient within normal limits.  Neurology: Johney Maine sensation intact via light touch bilateral. Protective diminished bilateral burning neuropathy pain on Lyrica   Musculoskeletal: Mild tenderness with palpation at Right dorsolateral foot and ankle and mild pain to left ankle with bone spurs. Range of motion limited bilateral R>L. Strength within normal limits in all groups bilateral.   Gait: Antalgic gait  Xrays  Right and Left Ankle   Impression:Severe arthritis with ankle collapse on right with swelling and digital deformity. Midtarsal breach supportive of pes planus.   Assessment and Plan: Problem List Items Addressed This Visit    None    Visit Diagnoses    Pain in right foot    -  Primary   Relevant Medications   triamcinolone acetonide (KENALOG) 10 MG/ML injection 10 mg (Start on 08/31/2018  2:30 PM)   Pain in left foot       Arthritis of ankle       Relevant Medications   triamcinolone acetonide (KENALOG) 10 MG/ML injection 10 mg (Start on 08/31/2018  2:30 PM)   Capsulitis of ankle, unspecified laterality       Relevant Medications   celecoxib (CELEBREX) 200 MG capsule   triamcinolone acetonide (KENALOG) 10 MG/ML injection 10 mg (Start on 08/31/2018  2:30 PM)   Diabetic polyneuropathy associated with type 2 diabetes mellitus ([place])         -Complete examination performed -Xrays reviewed -Discussed treatement options for arthritis, pes planus, foot deformity -Rx Celebrex -After oral consent and aseptic prep, injected a mixture containing 1 ml of 2%  plain lidocaine, 1 ml 0.5% plain marcaine, 0.5 ml of kenalog 10 and 0.5 ml of dexamethasone  phosphate into Right ankle at sinus tarsi without complication. Post-injection care discussed with patient.  -Recommend patient to see rick for orthotics and bracing and shoes to help offload arthritic joints; Dawn to verify benefits and schedule accordingly  -Patient to return to office for [facility] or sooner if condition worsens.  Landis Martins, DPM

## 2018-09-22 ENCOUNTER — Other Ambulatory Visit: Payer: Self-pay | Admitting: Nurse Practitioner

## 2018-09-22 DIAGNOSIS — J4520 Mild intermittent asthma, uncomplicated: Secondary | ICD-10-CM

## 2018-09-26 ENCOUNTER — Other Ambulatory Visit: Payer: Self-pay | Admitting: Sports Medicine

## 2018-10-21 ENCOUNTER — Other Ambulatory Visit: Payer: Self-pay | Admitting: Adult Health

## 2018-11-01 ENCOUNTER — Telehealth: Payer: Self-pay | Admitting: Neurology

## 2018-11-01 NOTE — Telephone Encounter (Signed)
I called this patient and LVM regarding rescheduling 9/2 appointment due to [name] being out of office.

## 2018-11-02 ENCOUNTER — Other Ambulatory Visit: Payer: Self-pay | Admitting: Nurse Practitioner

## 2018-11-02 DIAGNOSIS — J4521 Mild intermittent asthma with (acute) exacerbation: Secondary | ICD-10-CM

## 2018-11-03 ENCOUNTER — Ambulatory Visit: Payer: Medicare Other | Admitting: Neurology

## 2018-11-03 NOTE — Telephone Encounter (Signed)
Last rx was sent 04/2018, if she is using inhaler as prescribed, then she would be out at end of 04/2018. Please offer virtual virtual visit to evaluate need of inhaler.

## 2018-11-11 ENCOUNTER — Other Ambulatory Visit: Payer: Self-pay | Admitting: Nurse Practitioner

## 2018-11-11 DIAGNOSIS — J4520 Mild intermittent asthma, uncomplicated: Secondary | ICD-10-CM

## 2018-11-27 ENCOUNTER — Other Ambulatory Visit: Payer: Self-pay | Admitting: Nurse Practitioner

## 2018-11-27 DIAGNOSIS — J4521 Mild intermittent asthma with (acute) exacerbation: Secondary | ICD-10-CM

## 2018-11-27 DIAGNOSIS — J4520 Mild intermittent asthma, uncomplicated: Secondary | ICD-10-CM

## 2018-12-08 NOTE — Progress Notes (Addendum)
PATIENT: Sandra Brennan DOB: 25-Sep-1945  REASON FOR VISIT: follow up HISTORY FROM: patient  HISTORY OF PRESENT ILLNESS: Today 12/09/18  Sandra Brennan is a 73 year old female with history of chronic migraine headaches, and obstructive sleep apnea on CPAP.  Sandra Brennan presents today for follow-up.  After last visit, Sandra Brennan was taken off nortriptyline due to concern for interaction while taking tramadol for chronic pain and nortriptyline.  Sandra Brennan is not on any headache prevention medications.  Sandra Brennan says on average Sandra Brennan may have 2-3 migraines a month.  Sandra Brennan describes a daily mild headache.  Sandra Brennan will use Tylenol with good benefit, or special headache packs provided by her insurance company.  Sandra Brennan has continued to gain weight as result of stress and poor eating habits.  Sandra Brennan is a caregiver for her grandchildren.  Her CPAP download indicates excellent compliance, 93% use over 30 days.  Sandra Brennan uses her machine greater than 4 hours every night.  Her AHI was optimal at 1.7, Sandra Brennan had a mild leak of 22.6 at the 95th percentile. Her set pressure is 6 cmH2O. Sandra Brennan does report feeling that the machine dries her mouth out.  Sandra Brennan denies noticing a significant leak in the mask.  Sandra Brennan uses [facility] for her equipment.  Sandra Brennan uses a cane for ambulation when Sandra Brennan goes out.  Her blood pressure is elevated today, but Sandra Brennan has not taken her medications.  Sandra Brennan presents today for follow-up unaccompanied.  HISTORY 05/03/18 SS: Sandra Brennan is a 73 year old female who presents for follow-up for chronic migraine headaches.  At her last visit in July 2019 her nortriptyline was increased to 30 mg at bedtime.  Sandra Brennan reports significant stress in her life as Sandra Brennan is a caregiver for her mom, son and a grandchild.  Sandra Brennan reports that her headaches are better however Sandra Brennan says on average Sandra Brennan has about 1-2 bad headaches per month.  Sandra Brennan reports the headache will start behind her eyes and spread to bilateral temples.  Sandra Brennan will take Tylenol for the headache and the  headache is relieved within 1 hour.  Sandra Brennan also reports a weight gain of about 60 pounds in the last year.  Sandra Brennan reports Sandra Brennan continues to use her CPAP machine and denies any problems with the equipment.  Sandra Brennan denies any new problems or concerns today Sandra Brennan presents today for follow-up unaccompanied.  09/10/2017 MM: Sandra Brennan is a 73 year old female with a history of obstructive sleep apnea on CPAP and migraine headaches.  Sandra Brennan returns today for follow-up.  Her CPAP download indicates that Sandra Brennan use her machine nightly for compliance of 100%.  Sandra Brennan use her machine greater than 4 hours each night.  On average Sandra Brennan uses her machine 7 hours and 15 minutes.  Her residual AHI is 0.5 on 6 cm of water with EPR of 1.  Sandra Brennan does not have a significant leak.  Sandra Brennan reports that the CPAP continues to work well for her.  Sandra Brennan has been seeing Dr. Krista Blue for migraine headaches.  Sandra Brennan states that nortriptyline was working well but recently Sandra Brennan has been having a daily headache.  Sandra Brennan states that Sandra Brennan does keep a 67-year-old during the day as well.  Sandra Brennan reports that with her headaches Sandra Brennan does have photophobia and phonophobia but denies nausea and vomiting.  Sandra Brennan returns today for an evaluation.  REVIEW OF SYSTEMS: Out of a complete 14 system review of symptoms, the patient complains only of the following symptoms, and all other reviewed systems are negative.  Sleep apnea,  headaches  ALLERGIES: Allergies  Allergen Reactions   Cymbalta [Duloxetine Hcl] Anaphylaxis   Pineapple Shortness Of Breath   Influenza Vaccines Hives and Itching   Penicillins Hives   Sulfa Antibiotics Other (See Comments)    REACTION: unknown   Theophyllines Hives    HOME MEDICATIONS: Outpatient Medications Prior to Visit  Medication Sig Dispense Refill   albuterol (VENTOLIN HFA) 108 (90 Base) MCG/ACT inhaler INHALE 1-2 PUFFS EVERY 6 (SIX) HOURS AS NEEDED FOR WHEEZING OR SHORTNESS OF BREATH. NEEDS 6.7 g 0   allopurinol (ZYLOPRIM) 100 MG tablet Take 1  tablet (100 mg total) by mouth daily. 90 tablet 2   aspirin EC 81 MG tablet Take 81 mg by mouth daily.     atorvastatin (LIPITOR) 10 MG tablet Take 1 tablet (10 mg total) by mouth daily at 6 PM. 90 tablet 3   Carboxymethylcellulose Sodium (EYE DROPS OP) Apply to eye. Lubricant eye drops     celecoxib (CELEBREX) 200 MG capsule TAKE 1 CAPSULE BY MOUTH TWICE A DAY 60 capsule 0   cetirizine (ZYRTEC) 10 MG tablet Take 10 mg by mouth daily.     cholecalciferol (VITAMIN D) 1000 units tablet Take 1 tablet (1,000 Units total) by mouth daily. (Patient taking differently: Take 1,000 Units by mouth 2 (two) times daily. )     cycloSPORINE (RESTASIS) 0.05 % ophthalmic emulsion Restasis 0.05 % eye drops in a dropperette     lisinopril-hydrochlorothiazide (ZESTORETIC) 10-12.5 MG tablet Take 1 tablet by mouth daily. 90 tablet 3   omeprazole (PRILOSEC) 20 MG capsule Take 1 capsule (20 mg total) by mouth daily. 1 capsule 0   OVER THE COUNTER MEDICATION Cystic magnesium     pregabalin (LYRICA) 100 MG capsule Take 1 capsule (100 mg total) by mouth 2 (two) times daily. 60 capsule 5   Sennosides 8.6 MG CAPS Take by mouth.     SYMBICORT 160-4.5 MCG/ACT inhaler INHALE 1 PUFF INTO THE LUNGS 2 (TWO) TIMES DAILY FOR 30 DAYS. 10.2 Inhaler 5   traMADol (ULTRAM) 50 MG tablet Take 1 tablet (50 mg total) by mouth 2 (two) times daily. 60 tablet 5   nortriptyline (PAMELOR) 10 MG capsule TAKE 3 CAPSULES (30 MG TOTAL) BY MOUTH AT BEDTIME. 270 capsule 1   No facility-administered medications prior to visit.     PAST MEDICAL HISTORY: Past Medical History:  Diagnosis Date   Anemia    Arthritis    "plenty" (August 07, 2012)   Asthma    Chronic lower back pain    "they say I need a whole new spinal column" (08/07/12)   COPD (chronic obstructive pulmonary disease) (HCC)    Degenerative arthritis    "all over" (08-07-2012)   Fx ankle    GERD (gastroesophageal reflux disease)    Gout 05/05/2013   Hypertension      Migraines    "used to; totally stopped when I quit drinking 30 yr ago" (Aug 07, 2012)   Myalgia and myositis    Myocardial infarction [facility]) 1992   Aug 07, 2012 "mild MI when son died "   Obesity    Osteoporosis    "all over" (08-07-12)   Shortness of breath    when stressed.   Sleep apnea    "never did fix my machine; haven't used one for 5 years; I've lost 116# since then; no problems now" (08-07-12)   Type II diabetes mellitus ([place])    "borderline; don't test; take Metformin" (08/07/2012)    PAST SURGICAL HISTORY: Past Surgical History:  Procedure  Laterality Date   ABDOMINAL HYSTERECTOMY  1990's   GANGLION CYST EXCISION Right 1980's   "wrist" (08/03/2012)   JOINT REPLACEMENT     KNEE LIGAMENT RECONSTRUCTION Right 1980's   TONSILLECTOMY  ~ [place] Left 08/03/2012   TOTAL HIP ARTHROPLASTY Left 08/03/2012   Procedure: TOTAL HIP ARTHROPLASTY;  Surgeon: Garald Balding, MD;  Location: [place];  Service: Orthopedics;  Laterality: Left;  Left Total Hip Arthroplasty   TOTAL HIP ARTHROPLASTY Left 08/28/2012   Procedure: Irrigation and Debridement hip ;  Surgeon: Mcarthur Rossetti, MD;  Location: [place];  Service: Orthopedics;  Laterality: Left;   TOTAL KNEE ARTHROPLASTY Left 2001   TOTAL KNEE ARTHROPLASTY Right 2004    FAMILY HISTORY: Family History  Problem Relation Age of Onset   Arthritis Mother    Arthritis Father    Colon cancer Father    Diabetes Sister    Arthritis Sister    Diabetes Maternal Grandmother    Arthritis Maternal Grandmother    Diabetes Maternal Grandfather    Arthritis Maternal Grandfather     SOCIAL HISTORY: Social History   Socioeconomic History   Marital status: Widowed    Spouse name: Not on file   Number of children: 5   Years of education: PhD   Highest education level: Not on file  Occupational History   Occupation: Retired  Scientist, product/process development strain: Not on file   Food  insecurity    Worry: Not on file    Inability: Not on Lexicographer needs    Medical: Not on file    Non-medical: Not on file  Tobacco Use   Smoking status: Former Smoker    Packs/day: 0.12    Years: 2.00    Pack years: 0.24    Types: Cigarettes   Smokeless tobacco: Never Used   Tobacco comment: 08/03/2012 "quit 30 years ago"  Substance and Sexual Activity   Alcohol use: No    Alcohol/week: 0.0 standard drinks    Comment: 08/03/2012 "used to be a beeralcoholic"; stopped ~ 30 yr ago"   Drug use: No   Sexual activity: Never  Lifestyle   Physical activity    Days per week: Not on file    Minutes per session: Not on file   Stress: Not on file  Relationships   Social connections    Talks on phone: Not on file    Gets together: Not on file    Attends religious service: Not on file    Active member of club or organization: Not on file    Attends meetings of clubs or organizations: Not on file    Relationship status: Not on file   Intimate partner violence    Fear of current or ex partner: Not on file    Emotionally abused: Not on file    Physically abused: Not on file    Forced sexual activity: Not on file  Other Topics Concern   Not on file  Social History Narrative   Lives at home with her mother and caregiver.   Occasional use of caffeine.   Right-handed.    PHYSICAL EXAM  Vitals:   12/09/18 0940  BP: (!) 175/75  Pulse: 69  Temp: 97.8 F (36.6 C)  Weight: 274 lb 3.2 oz (124.4 kg)  Height: 5\' 2"  (1.575 m)   Body mass index is 50.15 kg/m.  Generalized: Well developed, in no acute distress, obese  Neurological examination  Mentation: Alert oriented to time, place, history taking. Follows all commands speech and language fluent Cranial nerve II-XII: Pupils were equal round reactive to light. Extraocular movements were full, visual field were full on confrontational test. Facial sensation and strength were normal.  Head turning and shoulder shrug   were normal and symmetric. Motor: The motor testing reveals 5 over 5 strength of all 4 extremities. Good symmetric motor tone is noted throughout.  Sensory: Sensory testing is intact to soft touch on all 4 extremities. No evidence of extinction is noted.  Coordination: Cerebellar testing reveals good finger-nose-finger and heel-to-shin bilaterally.  Gait and station: Gait is normal, but wide-based, uses a cane, Sandra Brennan is obese, pace is relatively slow Reflexes: Deep tendon reflexes are symmetric   DIAGNOSTIC DATA (LABS, IMAGING, TESTING) - I reviewed patient records, labs, notes, testing and imaging myself where available.  Lab Results  Component Value Date   WBC 6.4 05/03/2017   HGB 11.4 (L) 05/03/2017   HCT 34.7 (L) 05/03/2017   MCV 92.0 05/03/2017   PLT 219 05/03/2017      Component Value Date/Time   NA 142 04/09/2018 1549   NA 142 07/14/2014 1014   K 4.1 04/09/2018 1549   CL 106 04/09/2018 1549   CO2 26 04/09/2018 1549   GLUCOSE 85 04/09/2018 1549   BUN 18 04/09/2018 1549   BUN 12 07/14/2014 1014   CREATININE 1.39 (H) 04/09/2018 1549   CALCIUM 8.9 04/09/2018 1549   PROT 6.8 08/31/2017 1108   PROT 6.9 07/14/2014 1014   ALBUMIN 3.6 08/31/2017 1108   ALBUMIN 4.3 07/14/2014 1014   AST 19 08/31/2017 1108   ALT 10 08/31/2017 1108   ALKPHOS 93 08/31/2017 1108   BILITOT 0.5 08/31/2017 1108   BILITOT 0.2 07/14/2014 1014   GFRNONAA 44 (L) 05/03/2017 1238   GFRAA 51 (L) 05/03/2017 1238   Lab Results  Component Value Date   CHOL 132 08/31/2017   HDL 68.90 08/31/2017   LDLCALC 54 08/31/2017   TRIG 42.0 08/31/2017   CHOLHDL 2 08/31/2017   Lab Results  Component Value Date   HGBA1C 5.7 (H) 04/09/2018   Lab Results  Component Value Date   VITAMINB12 307 07/14/2014   Lab Results  Component Value Date   TSH 2.80 02/26/2017    ASSESSMENT AND PLAN 73 y.o. year old female  has a past medical history of Anemia, Arthritis, Asthma, Chronic lower back pain, COPD (chronic  obstructive pulmonary disease) ([place]), Degenerative arthritis, Fx ankle, GERD (gastroesophageal reflux disease), Gout (05/05/2013), Hypertension, Migraines, Myalgia and myositis, Myocardial infarction ([place]) (1992), Obesity, Osteoporosis, Shortness of breath, Sleep apnea, and Type II diabetes mellitus ([place]). here with:  1.  Chronic migraine headache -Indicating 2-3 migraines a month, daily mild headache -Start Topamax titrating to 75 mg at bedtime, may also benefit weight loss -Continue Tylenol as needed, headache packs provided by insurance company -BP elevated today, but Sandra Brennan did not take her medication -Return in 6 months or sooner if needed  2.  Obstructive sleep apnea, on CPAP -Download indicates good compliance and treatment of her apnea (93% out of 30 days, greater than 4 hours every night, AHI 1.7) -Try to increase the humidity by 1 point on your machine, also try biotin spray or mouthwash before applying the mask for dry mouth -Mild leak at 22.6 of 95th percentile, Sandra Brennan denies any leak, if it increases or becomes bothersome to her, we will order for mask refitting  I spent 25 minutes with the patient.  50% of this time was spent discussing her plan of care and reviewing her CPAP download.   Butler Denmark, AGNP-C, DNP 12/09/2018, 9:42 AM [facility] [address] [place],  [address] [phone]  I reviewed the above note and documentation by the Nurse Practitioner and agree with the history, exam, assessment and plan as outlined above. I was available for consultation. Star Age, MD, PhD [facility])

## 2018-12-09 ENCOUNTER — Other Ambulatory Visit: Payer: Self-pay

## 2018-12-09 ENCOUNTER — Ambulatory Visit (INDEPENDENT_AMBULATORY_CARE_PROVIDER_SITE_OTHER): Payer: Medicare Other | Admitting: Neurology

## 2018-12-09 ENCOUNTER — Encounter: Payer: Self-pay | Admitting: Neurology

## 2018-12-09 VITALS — BP 175/75 | HR 69 | Temp 97.8°F | Ht 62.0 in | Wt 274.2 lb

## 2018-12-09 DIAGNOSIS — G4730 Sleep apnea, unspecified: Secondary | ICD-10-CM

## 2018-12-09 DIAGNOSIS — G43009 Migraine without aura, not intractable, without status migrainosus: Secondary | ICD-10-CM

## 2018-12-09 MED ORDER — TOPIRAMATE 25 MG PO TABS: ORAL_TABLET | ORAL | 3 refills | Status: DC

## 2018-12-09 NOTE — Patient Instructions (Addendum)
1. Start topamax 25 mg at bedtime x 3 days, then take 50 mg at bedtime x 3 days, then take 75 mg at bedtime to see if this helps your headaches, may also benefit weight loss 2. Increase the humidity on your CPAP machine to find a level that feels more comfortable, increase 1 point at a time 3. Buy biotene spray or mouthwash to use before putting the mask on 4. Return in 6 months

## 2018-12-13 NOTE — Progress Notes (Signed)
I have reviewed and agreed above plan. 

## 2019-01-04 ENCOUNTER — Other Ambulatory Visit: Payer: Self-pay | Admitting: Nurse Practitioner

## 2019-01-04 DIAGNOSIS — M1A00X0 Idiopathic chronic gout, unspecified site, without tophus (tophi): Secondary | ICD-10-CM

## 2019-01-04 DIAGNOSIS — M1A9XX0 Chronic gout, unspecified, without tophus (tophi): Secondary | ICD-10-CM

## 2019-01-19 ENCOUNTER — Encounter: Payer: Self-pay | Admitting: Nurse Practitioner

## 2019-02-01 ENCOUNTER — Encounter: Payer: Medicare Other | Attending: Registered Nurse | Admitting: Registered Nurse

## 2019-02-01 ENCOUNTER — Other Ambulatory Visit: Payer: Self-pay

## 2019-02-01 VITALS — BP 159/79 | HR 72 | Temp 97.7°F | Ht 62.0 in | Wt 271.0 lb

## 2019-02-01 DIAGNOSIS — M25561 Pain in right knee: Secondary | ICD-10-CM | POA: Diagnosis present

## 2019-02-01 DIAGNOSIS — Z5181 Encounter for therapeutic drug level monitoring: Secondary | ICD-10-CM

## 2019-02-01 DIAGNOSIS — G894 Chronic pain syndrome: Secondary | ICD-10-CM | POA: Diagnosis not present

## 2019-02-01 DIAGNOSIS — G8929 Other chronic pain: Secondary | ICD-10-CM | POA: Diagnosis present

## 2019-02-01 DIAGNOSIS — M25512 Pain in left shoulder: Secondary | ICD-10-CM | POA: Insufficient documentation

## 2019-02-01 DIAGNOSIS — M48062 Spinal stenosis, lumbar region with neurogenic claudication: Secondary | ICD-10-CM | POA: Diagnosis present

## 2019-02-01 DIAGNOSIS — M25562 Pain in left knee: Secondary | ICD-10-CM | POA: Insufficient documentation

## 2019-02-01 DIAGNOSIS — Z79899 Other long term (current) drug therapy: Secondary | ICD-10-CM | POA: Diagnosis present

## 2019-02-01 DIAGNOSIS — M797 Fibromyalgia: Secondary | ICD-10-CM | POA: Diagnosis not present

## 2019-02-01 MED ORDER — PREGABALIN 100 MG PO CAPS: 100.0000 mg | ORAL_CAPSULE | Freq: Two times a day (BID) | ORAL | 5 refills | Status: DC

## 2019-02-01 MED ORDER — TRAMADOL HCL 50 MG PO TABS: 50.0000 mg | ORAL_TABLET | Freq: Two times a day (BID) | ORAL | 5 refills | Status: DC

## 2019-02-01 NOTE — Progress Notes (Signed)
Subjective:    Patient ID: SANAI ARCILLA, female    DOB: [birth year], 73 y.o.   MRN: [id number]  HPI: Sandra Brennan is a 73 y.o. female who returns for follow up appointment for chronic pain and medication refill. She states her  pain is located in her left shoulder and bilateral feet. She rates her pain 8. Her current exercise regime is walking.  Ms. Olden Morphine equivalent is 10.00 MME.  Last Oral Swab was Performed on 08/02/2018, it was consistent.   Pain Inventory Average Pain 8 Pain Right Now 8 My pain is sharp, burning, stabbing and aching  In the last 24 hours, has pain interfered with the following? General activity 5 Relation with others 5 Enjoyment of life 9 What TIME of day is your pain at its worst? evening and night Sleep (in general) Fair  Pain is worse with: walking, inactivity and standing Pain improves with: rest Relief from Meds: 7  Mobility walk without assistance walk with assistance use a cane how many minutes can you walk? 10 ability to climb steps?  yes do you drive?  no  Function disabled: date disabled 08/1999  Neuro/Psych bowel control problems numbness trouble walking  Prior Studies Any changes since last visit?  no x-rays  Physicians involved in your care Primary care .   Family History  Problem Relation Age of Onset  . Arthritis Mother   . Arthritis Father   . Colon cancer Father   . Diabetes Sister   . Arthritis Sister   . Diabetes Maternal Grandmother   . Arthritis Maternal Grandmother   . Diabetes Maternal Grandfather   . Arthritis Maternal Grandfather    Social History   Socioeconomic History  . Marital status: Widowed    Spouse name: Not on file  . Number of children: 5  . Years of education: PhD  . Highest education level: Not on file  Occupational History  . Occupation: Retired  Scientific laboratory technician  . Financial resource strain: Not on file  . Food insecurity    Worry: Not on file    Inability: Not on file  .  Transportation needs    Medical: Not on file    Non-medical: Not on file  Tobacco Use  . Smoking status: Former Smoker    Packs/day: 0.12    Years: 2.00    Pack years: 0.24    Types: Cigarettes  . Smokeless tobacco: Never Used  . Tobacco comment: 08/03/2012 "quit 30 years ago"  Substance and Sexual Activity  . Alcohol use: No    Alcohol/week: 0.0 standard drinks    Comment: 08/03/2012 "used to be a beeralcoholic"; stopped ~ 30 yr ago"  . Drug use: No  . Sexual activity: Never  Lifestyle  . Physical activity    Days per week: Not on file    Minutes per session: Not on file  . Stress: Not on file  Relationships  . Social Herbalist on phone: Not on file    Gets together: Not on file    Attends religious service: Not on file    Active member of club or organization: Not on file    Attends meetings of clubs or organizations: Not on file    Relationship status: Not on file  Other Topics Concern  . Not on file  Social History Narrative   Lives at home with her mother and caregiver.   Occasional use of caffeine.   Right-handed.   Past Surgical  History:  Procedure Laterality Date  . ABDOMINAL HYSTERECTOMY  1990's  . GANGLION CYST EXCISION Right 1980's   "wrist" (08-26-2012)  . JOINT REPLACEMENT    . KNEE LIGAMENT RECONSTRUCTION Right 1980's  . TONSILLECTOMY  ~ 1954  . TOTAL HIP ARTHROPLASTY Left 08-26-12  . TOTAL HIP ARTHROPLASTY Left Aug 26, 2012   Procedure: TOTAL HIP ARTHROPLASTY;  Surgeon: Garald Balding, MD;  Location: [place];  Service: Orthopedics;  Laterality: Left;  Left Total Hip Arthroplasty  . TOTAL HIP ARTHROPLASTY Left 08/28/2012   Procedure: Irrigation and Debridement hip ;  Surgeon: Mcarthur Rossetti, MD;  Location: [place];  Service: Orthopedics;  Laterality: Left;  . TOTAL KNEE ARTHROPLASTY Left 2001  . TOTAL KNEE ARTHROPLASTY Right 2004   Past Medical History:  Diagnosis Date  . Anemia   . Arthritis    "plenty" (08-26-2012)  . Asthma   . Chronic  lower back pain    "they say I need a whole new spinal column" (08-26-12)  . COPD (chronic obstructive pulmonary disease) ([place])   . Degenerative arthritis    "all over" (08-26-12)  . Fx ankle   . GERD (gastroesophageal reflux disease)   . Gout 05/05/2013  . Hypertension   . Migraines    "used to; totally stopped when I quit drinking 30 yr ago" (Aug 26, 2012)  . Myalgia and myositis   . Myocardial infarction [facility]) 1992   2012-08-26 "mild MI when son died "  . Obesity   . Osteoporosis    "all over" (08-26-2012)  . Shortness of breath    when stressed.  . Sleep apnea    "never did fix my machine; haven't used one for 5 years; I've lost 116# since then; no problems now" (Aug 26, 2012)  . Type II diabetes mellitus ([place])    "borderline; don't test; take Metformin" (2012/08/26)   BP (!) 159/79   Pulse 72   Temp 97.7 F (36.5 C)   Ht 5\' 2"  (1.575 m)   Wt 271 lb (122.9 kg)   SpO2 93%   BMI 49.57 kg/m   Opioid Risk Score:   Fall Risk Score:  `1  Depression screen PHQ 2/9  Depression screen [facility] 2/9 07/12/2018 04/09/2018 04/09/2018 04/01/2017 02/26/2017 02/02/2017 06/09/2016  Decreased Interest 0 2 0 0 0 0 3  Down, Depressed, Hopeless 1 1 0 0 0 0 0  PHQ - 2 Score 1 3 0 0 0 0 3  Altered sleeping 3 3 - - - - 2  Tired, decreased energy 0 2 - - - - 3  Change in appetite 3 0 - - - - 3  Feeling bad or failure about yourself  1 0 - - - - 0  Trouble concentrating 0 2 - - - - 0  Moving slowly or fidgety/restless 0 0 - - - - 0  Suicidal thoughts 0 0 - - - - 0  PHQ-9 Score 8 10 - - - - 11  Difficult doing work/chores - - - - - - Somewhat difficult    Review of Systems  Constitutional: Positive for appetite change and unexpected weight change.  Respiratory: Positive for shortness of breath and wheezing.   Gastrointestinal: Positive for constipation.  Musculoskeletal: Positive for gait problem.  Neurological: Positive for weakness and numbness.       Objective:   Physical Exam Vitals signs and nursing  note reviewed.  Constitutional:      Appearance: Normal appearance. She is obese.  Neck:     Musculoskeletal: Normal  range of motion and neck supple.  Cardiovascular:     Rate and Rhythm: Normal rate and regular rhythm.     Pulses: Normal pulses.     Heart sounds: Normal heart sounds.  Pulmonary:     Effort: Pulmonary effort is normal.     Breath sounds: Normal breath sounds.  Musculoskeletal:     Comments: Normal Muscle Bulk and Muscle Testing Reveals:  Upper Extremities: Right: Full ROM and Muscle Strength 5/5 Left: Decreased ROM 90 Degrees and Muscle Strength 5/5 Lumbar Paraspinal Tenderness: L-4-L-5 Lower Extremities: Right: Full ROM and Muscle Strength 5/5 Right Lower Extremity Flexion Produces Pain into her right lower extremity and right foot Left Lower Extremity: Full ROM and Muscle Strength 5/5 and Left lower Extremity Flexion Produces Pain into her Left Patella Arises from Table slowly using cane for support Antalgic  Gait   Neurological:     Mental Status: She is alert and oriented to person, place, and time.  Psychiatric:        Mood and Affect: Mood normal.        Behavior: Behavior normal.           Assessment & Plan:  1. Chronic  Left Shoulder Pain: Continue HEP as Tolerated.  Continue to Monitor. 02/01/2019 2. Lumbar Spondylosis without Myelopathy: Continue HEP as Tolerated . Continue to Monitor. 02/01/2019 3. Right Ankle Osteoarthritis: Refilled: Tramadol 50 mg one tablet BID as needed for pain #60. Marland Kitchen Continue to monitor.  We will continue the opioid monitoring program, this consists of regular clinic visits, examinations, urine drug screen, pill counts as well as use of [place] Controlled Substance Reporting system. 4. Fibromylagia Syndrome: Continue Lyrica. Continue to Monitor. 02/01/2019  15 Minutes of face to face patient care time visit. All questions were encouraged and answered.   F/U in 6 months with Dr. Letta Pate

## 2019-02-07 ENCOUNTER — Encounter: Payer: Self-pay | Admitting: Registered Nurse

## 2019-02-16 ENCOUNTER — Telehealth: Payer: Self-pay | Admitting: Neurology

## 2019-02-16 DIAGNOSIS — G4730 Sleep apnea, unspecified: Secondary | ICD-10-CM

## 2019-02-16 DIAGNOSIS — G4733 Obstructive sleep apnea (adult) (pediatric): Secondary | ICD-10-CM

## 2019-02-16 DIAGNOSIS — Z9989 Dependence on other enabling machines and devices: Secondary | ICD-10-CM

## 2019-02-16 NOTE — Telephone Encounter (Signed)
Confirmation of CPAP message received from Jonn Shingles, [place].

## 2019-02-16 NOTE — Telephone Encounter (Signed)
I have placed order for CPAP supplies for pt per last seen by SS/NP.  Sent CM to [facility] to let them know.

## 2019-02-16 NOTE — Addendum Note (Signed)
Addended by: Brandon Melnick on: 02/16/2019 03:27 PM   Modules accepted: Orders

## 2019-02-16 NOTE — Telephone Encounter (Signed)
Pt has called to inform that she has been told by [phone]) that they will not fill her needed CPAP supplies until they hear from the office of the provider  of who started  Her on the CPAP.  Please call

## 2019-02-21 ENCOUNTER — Ambulatory Visit: Payer: Medicare Other | Admitting: Nurse Practitioner

## 2019-02-22 ENCOUNTER — Ambulatory Visit (INDEPENDENT_AMBULATORY_CARE_PROVIDER_SITE_OTHER): Payer: Medicare Other | Admitting: Nurse Practitioner

## 2019-02-22 ENCOUNTER — Other Ambulatory Visit: Payer: Self-pay

## 2019-02-22 ENCOUNTER — Encounter: Payer: Self-pay | Admitting: Nurse Practitioner

## 2019-02-22 VITALS — BP 139/97 | HR 78 | Ht 62.0 in | Wt 272.0 lb

## 2019-02-22 DIAGNOSIS — I10 Essential (primary) hypertension: Secondary | ICD-10-CM

## 2019-02-22 DIAGNOSIS — E1142 Type 2 diabetes mellitus with diabetic polyneuropathy: Secondary | ICD-10-CM

## 2019-02-22 DIAGNOSIS — J4520 Mild intermittent asthma, uncomplicated: Secondary | ICD-10-CM | POA: Diagnosis not present

## 2019-02-22 DIAGNOSIS — M1A9XX0 Chronic gout, unspecified, without tophus (tophi): Secondary | ICD-10-CM | POA: Diagnosis not present

## 2019-02-22 DIAGNOSIS — L304 Erythema intertrigo: Secondary | ICD-10-CM

## 2019-02-22 MED ORDER — NYSTATIN 100000 UNIT/GM EX POWD: 1.0000 "application " | Freq: Two times a day (BID) | CUTANEOUS | 1 refills | Status: DC

## 2019-02-22 NOTE — Patient Instructions (Addendum)
Go to lab for blood draw: 02/28/19 (fasting)  Resume symbicort for next 61months. Rinse mouth after each use.   Intertrigo Intertrigo is skin irritation (inflammation) that happens in warm, moist areas of the body. The irritation can cause a rash and make skin raw and itchy. The rash is usually pink or red. It happens mostly between folds of skin or where skin rubs together, such as:  Between the toes.  In the armpits.  In the groin area.  Under the belly.  Under the breasts.  Around the butt area. This condition is not passed from person to person (is not contagious). What are the causes?  Heat, moisture, rubbing, and not enough air movement.  The condition can be made worse by: ? Sweat. ? Bacteria. ? A fungus, such as yeast. What increases the risk?  Moisture in your skin folds.  You are more likely to develop this condition if you: ? Have diabetes. ? Are overweight. ? Are not able to move around. ? Live in a warm and moist climate. ? Wear splints, braces, or other medical devices. ? Are not able to control your pee (urine) or poop (stool). What are the signs or symptoms?  A pink or red skin rash in the skin fold or near the skin fold.  Raw or scaly skin.  Itching.  A burning feeling.  Bleeding.  Leaking fluid.  A bad smell. How is this treated?  Cleaning and drying your skin.  Taking an antibiotic medicine or using an antibiotic skin cream for a bacterial infection.  Using an antifungal cream on your skin or taking pills for an infection that was caused by a fungus, such as yeast.  Using a steroid ointment to stop the itching and irritation.  Separating the skin fold with a clean cotton cloth to absorb moisture and allow air to flow into the area. Follow these instructions at home:  Keep the affected area clean and dry.  Do not scratch your skin.  Stay cool as much as you can. Use an air conditioner or a fan, if you have one.  Apply  over-the-counter and prescription medicines only as told by your doctor.  If you were prescribed an antibiotic medicine, use it as told by your doctor. Do not stop using the antibiotic even if your condition starts to get better.  Keep all follow-up visits as told by your doctor. This is important. How is this prevented?   Stay at a healthy weight.  Take care of your feet. This is very important if you have diabetes. You should: ? Wear shoes that fit well. ? Keep your feet dry. ? Wear clean cotton or wool socks.  Protect the skin in your groin and butt area as told by your doctor. To do this: ? Follow a regular cleaning routine. ? Use creams, powders, or ointments that protect your skin. ? Change protection pads often.  Do not wear tight clothes. Wear clothes that: ? Are loose. ? Take moisture away from your body. ? Are made of cotton.  Wear a bra that gives good support, if needed.  Shower and dry yourself well after being active. Use a hair dryer on a cool setting to dry between skin folds.  Keep your blood sugar under control if you have diabetes. Contact a doctor if:  Your symptoms do not get better with treatment.  Your symptoms get worse or they spread.  You notice more redness and warmth.  You have a fever. Summary  Intertrigo is skin irritation that occurs when folds of skin rub together.  This condition is caused by heat, moisture, and rubbing.  This condition may be treated by cleaning and drying your skin and with medicines.  Apply over-the-counter and prescription medicines only as told by your doctor.  Keep all follow-up visits as told by your doctor. This is important. This information is not intended to replace advice given to you by your health care provider. Make sure you discuss any questions you have with your health care provider. Document Released: 03/22/2010 Document Revised: 11/26/2017 Document Reviewed: 11/26/2017 Elsevier Patient Education   2020 [employer].

## 2019-02-22 NOTE — Progress Notes (Signed)
Virtual Visit via Video Note  I connected with Sandra Brennan on 02/22/19 at  9:00 AM EST by a video enabled telemedicine application and verified that I am speaking with the correct person using two identifiers.  Location: Patient:Home Provider:office. Participants: patient and provider I discussed the limitations of evaluation and management by telemedicine and the availability of in person appointments. The patient expressed understanding and agreed to proceed.  CC:6 mo fu/DM,BP and lipid---do not check BP and blood sugar at home/ pt is concer about skin fold under breast and abd area gets really sweaty--rash-raw-bad odor--going on 1 yr/using albuterol inhaler everyday now/pt report topamax give her bad depression.   History of Present Illness: Gout:  denies any acute gout exacerbation.  current use of allopurinol 100mg  with uric acid level of 6.8 5months ago. Stable renal function at Stage 3 CKD.  DM:  Controlled without medication, positive neuropathy Last hgb A1c of 5.7 Normal urine microalbumin. LDL at goal, no statin at this time.  HTN: Stable with lisinopril/HCTZ. Reports LE edema (chronic, worse with prolong stand, and improves with leg elevation) Denies any cough or chest pain or PND. BP Readings from Last 3 Encounters:  02/22/19 (!) 139/97  02/01/19 (!) 159/79  12/09/18 (!) 175/75   Wt Readings from Last 3 Encounters:  02/22/19 272 lb (123.4 kg)  02/01/19 271 lb (122.9 kg)  12/09/18 274 lb 3.2 oz (124.4 kg)   Asthma: Rash This is a recurrent problem. The current episode started more than 1 month ago. The problem has been waxing and waning since onset. The affected locations include the abdomen. The rash is characterized by itchiness and scaling. She was exposed to nothing. Associated symptoms include shortness of breath. Pertinent negatives include no cough, fatigue, fever, joint pain or sore throat. Past treatments include moisturizer and anti-itch cream. The  treatment provided no relief. Her past medical history is significant for asthma.  Asthma She complains of chest tightness, difficulty breathing, shortness of breath and wheezing. There is no cough, frequent throat clearing, hemoptysis, hoarse voice or sputum production. This is a recurrent problem. The current episode started more than 1 year ago. The problem occurs daily. The problem has been waxing and waning. Pertinent negatives include no appetite change, chest pain, fever, headaches, heartburn, malaise/fatigue, myalgias, nasal congestion, orthopnea, postnasal drip, sneezing, sore throat, sweats or trouble swallowing. Her symptoms are aggravated by strenuous activity. Her symptoms are alleviated by beta-agonist. She reports significant improvement on treatment. Her symptoms are not alleviated by rest. Her past medical history is significant for asthma.  she discontinued symbicort 81months ago.  Observations/Objective: Physical Exam  Constitutional: She is oriented to person, place, and time. No distress.  Cardiovascular: Normal rate.  Pulmonary/Chest: Effort normal.  Musculoskeletal:        General: Edema present.  Neurological: She is alert and oriented to person, place, and time.  Skin: Skin is warm and dry. Rash noted. No erythema.  Psychiatric: She has a normal mood and affect. Her behavior is normal. Thought content normal.  Vitals reviewed.  Assessment and Plan: Kenneth was seen today for follow-up.  Diagnoses and all orders for this visit:  Type 2 diabetes mellitus with diabetic polyneuropathy, without long-term current use of insulin (HCC)  Essential hypertension  Mild intermittent chronic asthma without complication  Chronic gout involving toe of right foot without tophus, unspecified cause -     Uric acid; Future  Intertrigo -     nystatin (MYCOSTATIN/NYSTOP) powder; Apply 1 application topically  2 (two) times daily.   Follow Up Instructions: See avs   I discussed  the assessment and treatment plan with the patient. The patient was provided an opportunity to ask questions and all were answered. The patient agreed with the plan and demonstrated an understanding of the instructions.   The patient was advised to call back or seek an in-person evaluation if the symptoms worsen or if the condition fails to improve as anticipated.  Wilfred Lacy, NP

## 2019-02-22 NOTE — Assessment & Plan Note (Signed)
Use of albuterol daily Symptoms: wheezing.  Resume symbicort 1puff BID Use of albuterol prn F/up in 75month or sooner if needed. Obtain CXR and peak flow meter if no improvement with symbicort.

## 2019-02-28 ENCOUNTER — Other Ambulatory Visit: Payer: Self-pay

## 2019-02-28 ENCOUNTER — Other Ambulatory Visit (INDEPENDENT_AMBULATORY_CARE_PROVIDER_SITE_OTHER): Payer: Medicare Other

## 2019-02-28 DIAGNOSIS — M1A9XX0 Chronic gout, unspecified, without tophus (tophi): Secondary | ICD-10-CM | POA: Diagnosis not present

## 2019-02-28 DIAGNOSIS — E1142 Type 2 diabetes mellitus with diabetic polyneuropathy: Secondary | ICD-10-CM

## 2019-02-28 DIAGNOSIS — M1A00X0 Idiopathic chronic gout, unspecified site, without tophus (tophi): Secondary | ICD-10-CM

## 2019-02-28 DIAGNOSIS — E782 Mixed hyperlipidemia: Secondary | ICD-10-CM

## 2019-02-28 DIAGNOSIS — I10 Essential (primary) hypertension: Secondary | ICD-10-CM

## 2019-02-28 LAB — HEMOGLOBIN A1C: Hgb A1c MFr Bld: 5.8 % (ref 4.6–6.5)

## 2019-02-28 LAB — COMPREHENSIVE METABOLIC PANEL
ALT: 12 U/L (ref 0–35)
AST: 20 U/L (ref 0–37)
Albumin: 3.7 g/dL (ref 3.5–5.2)
Alkaline Phosphatase: 74 U/L (ref 39–117)
BUN: 17 mg/dL (ref 6–23)
CO2: 24 mEq/L (ref 19–32)
Calcium: 9.3 mg/dL (ref 8.4–10.5)
Chloride: 105 mEq/L (ref 96–112)
Creatinine, Ser: 1.2 mg/dL (ref 0.40–1.20)
GFR: 53.47 mL/min — ABNORMAL LOW (ref >60.00)
Glucose, Bld: 86 mg/dL (ref 70–99)
Potassium: 3.9 mEq/L (ref 3.5–5.1)
Sodium: 137 mEq/L (ref 135–145)
Total Bilirubin: 0.4 mg/dL (ref 0.2–1.2)
Total Protein: 6.6 g/dL (ref 6.0–8.3)

## 2019-02-28 LAB — LIPID PANEL
Cholesterol: 129 mg/dL (ref 0–200)
HDL: 59.8 mg/dL (ref >39.00)
LDL Cholesterol: 58 mg/dL (ref 0–99)
NonHDL: 69.08
Total CHOL/HDL Ratio: 2
Triglycerides: 57 mg/dL (ref 0.0–149.0)
VLDL: 11.4 mg/dL (ref 0.0–40.0)

## 2019-02-28 LAB — CBC
HCT: 32.6 % — ABNORMAL LOW (ref 36.0–46.0)
Hemoglobin: 10.6 g/dL — ABNORMAL LOW (ref 12.0–15.0)
MCHC: 32.4 g/dL (ref 30.0–36.0)
MCV: 89.9 fl (ref 78.0–100.0)
Platelets: 211 10*3/uL (ref 150.0–400.0)
RBC: 3.63 Mil/uL — ABNORMAL LOW (ref 3.87–5.11)
RDW: 13.8 % (ref 11.5–15.5)
WBC: 6.9 10*3/uL (ref 4.0–10.5)

## 2019-02-28 LAB — URIC ACID: Uric Acid, Serum: 7.3 mg/dL — ABNORMAL HIGH (ref 2.4–7.0)

## 2019-03-01 MED ORDER — LISINOPRIL-HYDROCHLOROTHIAZIDE 10-12.5 MG PO TABS: 1.0000 | ORAL_TABLET | Freq: Every day | ORAL | 1 refills | Status: DC

## 2019-03-01 MED ORDER — ATORVASTATIN CALCIUM 10 MG PO TABS: 10.0000 mg | ORAL_TABLET | Freq: Every day | ORAL | 3 refills | Status: DC

## 2019-03-01 MED ORDER — ALLOPURINOL 100 MG PO TABS: 100.0000 mg | ORAL_TABLET | Freq: Every day | ORAL | 3 refills | Status: DC

## 2019-03-01 NOTE — Addendum Note (Signed)
Addended by: Leana Gamer on: 03/01/2019 02:47 PM   Modules accepted: Orders

## 2019-03-07 ENCOUNTER — Other Ambulatory Visit: Payer: Self-pay

## 2019-03-07 ENCOUNTER — Encounter (HOSPITAL_COMMUNITY): Payer: Self-pay | Admitting: Emergency Medicine

## 2019-03-07 ENCOUNTER — Emergency Department (HOSPITAL_COMMUNITY)
Admission: EM | Admit: 2019-03-07 | Discharge: 2019-03-08 | Discharge status: Against Medical Advice | Payer: Medicare Other | Attending: Emergency Medicine | Admitting: Emergency Medicine

## 2019-03-07 DIAGNOSIS — R0602 Shortness of breath: Secondary | ICD-10-CM | POA: Diagnosis present

## 2019-03-07 DIAGNOSIS — Z5321 Procedure and treatment not carried out due to patient leaving prior to being seen by health care provider: Secondary | ICD-10-CM | POA: Diagnosis not present

## 2019-03-07 LAB — CBC WITH DIFFERENTIAL/PLATELET
Abs Immature Granulocytes: 0.03 10*3/uL (ref 0.00–0.07)
Basophils Absolute: 0 10*3/uL (ref 0.0–0.1)
Basophils Relative: 0 %
Eosinophils Absolute: 0.1 10*3/uL (ref 0.0–0.5)
Eosinophils Relative: 1 %
HCT: 35.3 % — ABNORMAL LOW (ref 36.0–46.0)
Hemoglobin: 10.6 g/dL — ABNORMAL LOW (ref 12.0–15.0)
Immature Granulocytes: 0 %
Lymphocytes Relative: 19 %
Lymphs Abs: 1.9 10*3/uL (ref 0.7–4.0)
MCH: 28.8 pg (ref 26.0–34.0)
MCHC: 30 g/dL (ref 30.0–36.0)
MCV: 95.9 fL (ref 80.0–100.0)
Monocytes Absolute: 0.5 10*3/uL (ref 0.1–1.0)
Monocytes Relative: 5 %
Neutro Abs: 7.4 10*3/uL (ref 1.7–7.7)
Neutrophils Relative %: 75 %
Platelets: 238 10*3/uL (ref 150–400)
RBC: 3.68 MIL/uL — ABNORMAL LOW (ref 3.87–5.11)
RDW: 13 % (ref 11.5–15.5)
WBC: 10 10*3/uL (ref 4.0–10.5)
nRBC: 0 % (ref 0.0–0.2)

## 2019-03-07 LAB — COMPREHENSIVE METABOLIC PANEL
ALT: 17 U/L (ref 0–44)
AST: 28 U/L (ref 15–41)
Albumin: 3.2 g/dL — ABNORMAL LOW (ref 3.5–5.0)
Alkaline Phosphatase: 76 U/L (ref 38–126)
Anion gap: 9 (ref 5–15)
BUN: 14 mg/dL (ref 8–23)
CO2: 27 mmol/L (ref 22–32)
Calcium: 8.9 mg/dL (ref 8.9–10.3)
Chloride: 105 mmol/L (ref 98–111)
Creatinine, Ser: 1.33 mg/dL — ABNORMAL HIGH (ref 0.44–1.00)
GFR calc Af Amer: 46 mL/min — ABNORMAL LOW (ref >60)
GFR calc non Af Amer: 40 mL/min — ABNORMAL LOW (ref >60)
Glucose, Bld: 107 mg/dL — ABNORMAL HIGH (ref 70–99)
Potassium: 4.6 mmol/L (ref 3.5–5.1)
Sodium: 141 mmol/L (ref 135–145)
Total Bilirubin: 0.3 mg/dL (ref 0.3–1.2)
Total Protein: 6.9 g/dL (ref 6.5–8.1)

## 2019-03-07 NOTE — ED Triage Notes (Signed)
Pt states she had a dental surgery today and she has increase SOB after the surgery. Pt states she has trouble to swallow.

## 2019-03-07 NOTE — ED Notes (Signed)
Dr Koren Bound called to follow up on pt. She had sedation today for removal of remaining teeth. Pt had clindamycin and was given oxycodone for home use.  Hx of asthma, COPD. Dr. Koren Bound was concerned about pt and the possibility of a delayed allergic reaction given the report of lip and tongue swelling and SOB. If need he can be reach at [phone].

## 2019-03-08 NOTE — ED Notes (Signed)
Pt stated that she is going to leave and her daughter is on her way. Pt was advised to stay and pt stated that she just wants to go home.

## 2019-03-18 LAB — HM DEXA SCAN: HM Dexa Scan: NORMAL

## 2019-03-18 LAB — HM MAMMOGRAPHY

## 2019-03-30 ENCOUNTER — Encounter: Payer: Self-pay | Admitting: Nurse Practitioner

## 2019-03-30 NOTE — Progress Notes (Signed)
Abstracted result and sent to scan  

## 2019-05-03 NOTE — Progress Notes (Signed)
Virtual Visit via Audio Note  I connected with patient on 05/04/19 at 10:15 AM EST by audio enabled telemedicine application and verified that I am speaking with the correct person using two identifiers.   THIS ENCOUNTER IS A VIRTUAL VISIT DUE TO COVID-19 - PATIENT WAS NOT SEEN IN THE OFFICE. PATIENT HAS CONSENTED TO VIRTUAL VISIT / TELEMEDICINE VISIT   Location of patient: home  Location of provider: office  I discussed the limitations of evaluation and management by telemedicine and the availability of in person appointments. The patient expressed understanding and agreed to proceed.   Subjective:   Sandra Brennan is a 74 y.o. female who presents for Medicare Annual (Subsequent) preventive examination.  Review of Systems:  Cardiac Risk Factors include: advanced age (>53men, >40 women);diabetes mellitus;dyslipidemia;hypertension Home Safety/Smoke Alarms: Feels safe in home. Smoke alarms in place.  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 Watches 66 yo grandson daily. Uses cane.  Female:    Mammo-03/18/19       Dexa scan- 03/18/19       CCS- Pt reports last done about 6 yrs ago    Objective:     Vitals: Unable to assess. This visit is enabled though telemedicine due to Covid 19.   Advanced Directives 05/04/2019 03/07/2019 05/03/2017 04/01/2017 12/08/2016 11/10/2016 10/06/2016  Does Patient Have a Medical Advance Directive? No No No No No No No  Type of Advance Directive - - - - - - -  Does patient want to make changes to medical advance directive? - - - - - - -  Copy of [place] in Chart? - - - - - - -  Would patient like information on creating a medical advance directive? No - Patient declined No - Patient declined - Yes (MAU/Ambulatory/Procedural Areas - Information given) - - -  Pre-existing out of facility DNR order (yellow form or pink MOST form) - - - - - - -    Tobacco Social History   Tobacco Use  Smoking Status Former  Smoker  . Packs/day: 0.12  . Years: 2.00  . Pack years: 0.24  . Types: Cigarettes  Smokeless Tobacco Never Used  Tobacco Comment   27-Aug-2012 "quit 30 years ago"     Counseling given: Not Answered Comment: Aug 27, 2012 "quit 30 years ago"   Clinical Intake:  Pain : No/denies pain     Past Medical History:  Diagnosis Date  . Anemia   . Arthritis    "plenty" (August 27, 2012)  . Asthma   . Chronic lower back pain    "they say I need a whole new spinal column" (08/27/12)  . COPD (chronic obstructive pulmonary disease) ([place])   . Degenerative arthritis    "all over" (2012-08-27)  . Fx ankle   . GERD (gastroesophageal reflux disease)   . Gout 05/05/2013  . Hypertension   . Migraines    "used to; totally stopped when I quit drinking 30 yr ago" (08/27/2012)  . Myalgia and myositis   . Myocardial infarction [facility]) 1992   08-27-2012 "mild MI when son died "  . Obesity   . Osteoporosis    "all over" (08/27/12)  . Shortness of breath    when stressed.  . Sleep apnea    "never did fix my machine; haven't used one for 5 years; I've lost 116# since then; no problems now" (08-27-2012)  . Type II diabetes mellitus ([place])    "borderline; don't test; take Metformin" (08/27/2012)  Past Surgical History:  Procedure Laterality Date  . ABDOMINAL HYSTERECTOMY  1990's  . GANGLION CYST EXCISION Right 1980's   "wrist" (08/03/2012)  . JOINT REPLACEMENT    . KNEE LIGAMENT RECONSTRUCTION Right 1980's  . TONSILLECTOMY  ~ 1954  . TOTAL HIP ARTHROPLASTY Left 08/03/2012  . TOTAL HIP ARTHROPLASTY Left 08/03/2012   Procedure: TOTAL HIP ARTHROPLASTY;  Surgeon: Garald Balding, MD;  Location: [place];  Service: Orthopedics;  Laterality: Left;  Left Total Hip Arthroplasty  . TOTAL HIP ARTHROPLASTY Left 08/28/2012   Procedure: Irrigation and Debridement hip ;  Surgeon: Mcarthur Rossetti, MD;  Location: [place];  Service: Orthopedics;  Laterality: Left;  . TOTAL KNEE ARTHROPLASTY Left 2001  . TOTAL KNEE ARTHROPLASTY Right  2004   Family History  Problem Relation Age of Onset  . Arthritis Mother   . Arthritis Father   . Colon cancer Father   . Diabetes Sister   . Arthritis Sister   . Diabetes Maternal Grandmother   . Arthritis Maternal Grandmother   . Diabetes Maternal Grandfather   . Arthritis Maternal Grandfather    Social History   Socioeconomic History  . Marital status: Widowed    Spouse name: Not on file  . Number of children: 5  . Years of education: PhD  . Highest education level: Not on file  Occupational History  . Occupation: Retired  Tobacco Use  . Smoking status: Former Smoker    Packs/day: 0.12    Years: 2.00    Pack years: 0.24    Types: Cigarettes  . Smokeless tobacco: Never Used  . Tobacco comment: 08/03/2012 "quit 30 years ago"  Substance and Sexual Activity  . Alcohol use: No    Alcohol/week: 0.0 standard drinks    Comment: 08/03/2012 "used to be a beeralcoholic"; stopped ~ 30 yr ago"  . Drug use: No  . Sexual activity: Never  Other Topics Concern  . Not on file  Social History Narrative   Lives at home with her mother and caregiver.   Occasional use of caffeine.   Right-handed.   Social Determinants of Health   Financial Resource Strain: Low Risk   . Difficulty of Paying Living Expenses: Not hard at all  Food Insecurity: No Food Insecurity  . Worried About Charity fundraiser in the Last Year: Never true  . Ran Out of Food in the Last Year: Never true  Transportation Needs: No Transportation Needs  . Lack of Transportation (Medical): No  . Lack of Transportation (Non-Medical): No  Physical Activity:   . Days of Exercise per Week: Not on file  . Minutes of Exercise per Session: Not on file  Stress:   . Feeling of Stress : Not on file  Social Connections:   . Frequency of Communication with Friends and Family: Not on file  . Frequency of Social Gatherings with Friends and Family: Not on file  . Attends Religious Services: Not on file  . Active Member of Clubs  or Organizations: Not on file  . Attends Archivist Meetings: Not on file  . Marital Status: Not on file    Outpatient Encounter Medications as of 05/04/2019  Medication Sig  . albuterol (VENTOLIN HFA) 108 (90 Base) MCG/ACT inhaler INHALE 1-2 PUFFS EVERY 6 (SIX) HOURS AS NEEDED FOR WHEEZING OR SHORTNESS OF BREATH. NEEDS  . allopurinol (ZYLOPRIM) 100 MG tablet Take 1 tablet (100 mg total) by mouth daily.  Marland Kitchen aspirin EC 81 MG tablet Take 81 mg  by mouth daily.  Marland Kitchen atorvastatin (LIPITOR) 10 MG tablet Take 1 tablet (10 mg total) by mouth daily at 6 PM.  . Carboxymethylcellulose Sodium (EYE DROPS OP) Apply to eye. Lubricant eye drops  . celecoxib (CELEBREX) 200 MG capsule TAKE 1 CAPSULE BY MOUTH TWICE A DAY  . cetirizine (ZYRTEC) 10 MG tablet Take 10 mg by mouth daily.  . cholecalciferol (VITAMIN D) 1000 units tablet Take 1 tablet (1,000 Units total) by mouth daily. (Patient taking differently: Take 1,000 Units by mouth 2 (two) times daily. )  . cycloSPORINE (RESTASIS) 0.05 % ophthalmic emulsion Restasis 0.05 % eye drops in a dropperette  . lisinopril-hydrochlorothiazide (ZESTORETIC) 10-12.5 MG tablet Take 1 tablet by mouth daily.  . Multiple Vitamin (MULTIVITAMIN) tablet Take 1 tablet by mouth daily.  Marland Kitchen nystatin (MYCOSTATIN/NYSTOP) powder Apply 1 application topically 2 (two) times daily.  Marland Kitchen omeprazole (PRILOSEC) 20 MG capsule Take 1 capsule (20 mg total) by mouth daily.  Marland Kitchen OVER THE COUNTER MEDICATION Cystic magnesium  . pregabalin (LYRICA) 100 MG capsule Take 1 capsule (100 mg total) by mouth 2 (two) times daily.  . Sennosides 8.6 MG CAPS Take by mouth.  . SYMBICORT 160-4.5 MCG/ACT inhaler INHALE 1 PUFF INTO THE LUNGS 2 (TWO) TIMES DAILY FOR 30 DAYS.  Marland Kitchen traMADol (ULTRAM) 50 MG tablet Take 1 tablet (50 mg total) by mouth 2 (two) times daily.   No facility-administered encounter medications on file as of 05/04/2019.    Activities of Daily Living In your present state of health, do you  have any difficulty performing the following activities: 05/04/2019  Hearing? N  Vision? N  Difficulty concentrating or making decisions? N  Walking or climbing stairs? Y  Dressing or bathing? N  Doing errands, shopping? Y  Comment dtr drives her where she needs to go  Preparing Food and eating ? N  Using the Toilet? N  In the past six months, have you accidently leaked urine? N  Do you have problems with loss of bowel control? N  Managing your Medications? N  Managing your Finances? N  Housekeeping or managing your Housekeeping? N  Some recent data might be hidden    Patient Care Team: Nche, Charlene Brooke, NP as PCP - General (Internal Medicine) Garald Balding, MD as Consulting Physician (Orthopedic Surgery)    Assessment:   This is a routine wellness examination for [place]. Physical assessment deferred to PCP.  Exercise Activities and Dietary recommendations Current Exercise Habits: Home exercise routine, Type of exercise: walking;stretching, Time (Minutes): 10, Frequency (Times/Week): 3, Weekly Exercise (Minutes/Week): 30, Intensity: Mild, Exercise limited by: None identified Diet (meal preparation, eat out, water intake, caffeinated beverages, dairy products, fruits and vegetables): well balanced     Goals    . Eat healthier       Fall Risk Fall Risk  05/04/2019 08/02/2018 04/09/2018 02/01/2018 08/05/2017  Falls in the past year? 0 1 0 1 No  Comment - - - last fall October 2019 -  Number falls in past yr: 0 0 - 1 -  Injury with Fall? 0 0 - 1 -  Comment - - - - -  Risk Factor Category  - - - - -  Risk for fall due to : - Impaired mobility;Impaired balance/gait;History of fall(s) - - -  Risk for fall due to: Comment - - - - -  Follow up Education provided;Falls prevention discussed Falls prevention discussed - - -   Depression Screen PHQ 2/9 Scores 05/04/2019 07/12/2018 04/09/2018 04/09/2018  PHQ - 2 Score 0 1 3 0  PHQ- 9 Score - 8 10 -     Cognitive Function Ad8 score  reviewed for issues:  Issues making decisions:no  Less interest in hobbies / activities:no  Repeats questions, stories (family complaining):no  Trouble using ordinary gadgets (microwave, computer, phone):no  Forgets the month or year: no  Mismanaging finances: no  Remembering appts:no  Daily problems with thinking and/or memory:no Ad8 score is=0   MMSE - Mini Mental State Exam 04/01/2017  Orientation to time 5  Orientation to Place 5  Registration 3  Attention/ Calculation 5  Recall 1  Language- name 2 objects 2  Language- repeat 1  Language- follow 3 step command 3  Language- read & follow direction 1  Write a sentence 1  Copy design 1  Total score 28        Immunization History  Administered Date(s) Administered  . Pneumococcal Conjugate-13 06/02/2016  . Pneumococcal Polysaccharide-23 08/31/2017    Screening Tests Health Maintenance  Topic Date Due  . TETANUS/TDAP  06/07/1966  . OPHTHALMOLOGY EXAM  05/12/2019  . FOOT EXAM  08/13/2019  . HEMOGLOBIN A1C  08/29/2019  . MAMMOGRAM  03/17/2021  . COLONOSCOPY  01/01/2022  . DEXA SCAN  Completed  . Hepatitis C Screening  Completed  . PNA vac Low Risk Adult  Completed     Plan:   See you next year!  Continue to eat heart healthy diet (full of fruits, vegetables, whole grains, lean protein, water--limit salt, fat, and sugar intake) and increase physical activity as tolerated.  Continue doing brain stimulating activities (puzzles, reading, adult coloring books, staying active) to keep memory sharp.   Bring a copy of your living will and/or healthcare power of attorney to your next office visit.   I have personally reviewed and noted the following in the patient's chart:   . Medical and social history . Use of alcohol, tobacco or illicit drugs  . Current medications and supplements . Functional ability and status . Nutritional status . Physical activity . Advanced directives . List of other  physicians . Hospitalizations, surgeries, and ER visits in previous 12 months . Vitals . Screenings to include cognitive, depression, and falls . Referrals and appointments  In addition, I have reviewed and discussed with patient certain preventive protocols, quality metrics, and best practice recommendations. A written personalized care plan for preventive services as well as general preventive health recommendations were provided to patient.     Shela Nevin, [place]  05/04/2019

## 2019-05-04 ENCOUNTER — Ambulatory Visit (INDEPENDENT_AMBULATORY_CARE_PROVIDER_SITE_OTHER): Payer: Medicare Other | Admitting: *Deleted

## 2019-05-04 ENCOUNTER — Encounter: Payer: Self-pay | Admitting: *Deleted

## 2019-05-04 DIAGNOSIS — Z0000 Encounter for general adult medical examination without abnormal findings: Secondary | ICD-10-CM | POA: Diagnosis not present

## 2019-05-04 NOTE — Patient Instructions (Signed)
See you next year!  Continue to eat heart healthy diet (full of fruits, vegetables, whole grains, lean protein, water--limit salt, fat, and sugar intake) and increase physical activity as tolerated.  Continue doing brain stimulating activities (puzzles, reading, adult coloring books, staying active) to keep memory sharp.   Bring a copy of your living will and/or healthcare power of attorney to your next office visit.    Sandra Brennan , Thank you for taking time to come for your Medicare Wellness Visit. I appreciate your ongoing commitment to your health goals. Please review the following plan we discussed and let me know if I can assist you in the future.   These are the goals we discussed: Goals    . Eat healthier       This is a list of the screening recommended for you and due dates:  Health Maintenance  Topic Date Due  . Tetanus Vaccine  06/07/1966  . Eye exam for diabetics  05/12/2019  . Complete foot exam   08/13/2019  . Hemoglobin A1C  08/29/2019  . Mammogram  03/17/2021  . Colon Cancer Screening  01/01/2022  . DEXA scan (bone density measurement)  Completed  .  Hepatitis C: One time screening is recommended by Center for Disease Control  (CDC) for  adults born from [birth year] through 1963.   Completed  . Pneumonia vaccines  Completed    Preventive Care 40 Years and Older, Female Preventive care refers to lifestyle choices and visits with your health care provider that can promote health and wellness. This includes:  A yearly physical exam. This is also called an annual well check.  Regular dental and eye exams.  Immunizations.  Screening for certain conditions.  Healthy lifestyle choices, such as diet and exercise. What can I expect for my preventive care visit? Physical exam Your health care provider will check:  Height and weight. These may be used to calculate body mass index (BMI), which is a measurement that tells if you are at a healthy weight.  Heart rate and  blood pressure.  Your skin for abnormal spots. Counseling Your health care provider may ask you questions about:  Alcohol, tobacco, and drug use.  Emotional well-being.  Home and relationship well-being.  Sexual activity.  Eating habits.  History of falls.  Memory and ability to understand (cognition).  Work and work Statistician.  Pregnancy and menstrual history. What immunizations do I need?  Influenza (flu) vaccine  This is recommended every year. Tetanus, diphtheria, and pertussis (Tdap) vaccine  You may need a Td booster every 10 years. Varicella (chickenpox) vaccine  You may need this vaccine if you have not already been vaccinated. Zoster (shingles) vaccine  You may need this after age 98. Pneumococcal conjugate (PCV13) vaccine  One dose is recommended after age 29. Pneumococcal polysaccharide (PPSV23) vaccine  One dose is recommended after age 84. Measles, mumps, and rubella (MMR) vaccine  You may need at least one dose of MMR if you were born in 1957 or later. You may also need a second dose. Meningococcal conjugate (MenACWY) vaccine  You may need this if you have certain conditions. Hepatitis A vaccine  You may need this if you have certain conditions or if you travel or work in places where you may be exposed to hepatitis A. Hepatitis B vaccine  You may need this if you have certain conditions or if you travel or work in places where you may be exposed to hepatitis B. Haemophilus influenzae type b (  Hib) vaccine  You may need this if you have certain conditions. You may receive vaccines as individual doses or as more than one vaccine together in one shot (combination vaccines). Talk with your health care provider about the risks and benefits of combination vaccines. What tests do I need? Blood tests  Lipid and cholesterol levels. These may be checked every 5 years, or more frequently depending on your overall health.  Hepatitis C  test.  Hepatitis B test. Screening  Lung cancer screening. You may have this screening every year starting at age 57 if you have a 30-pack-year history of smoking and currently smoke or have quit within the past 15 years.  Colorectal cancer screening. All adults should have this screening starting at age 87 and continuing until age 70. Your health care provider may recommend screening at age 49 if you are at increased risk. You will have tests every 1-10 years, depending on your results and the type of screening test.  Diabetes screening. This is done by checking your blood sugar (glucose) after you have not eaten for a while (fasting). You may have this done every 1-3 years.  Mammogram. This may be done every 1-2 years. Talk with your health care provider about how often you should have regular mammograms.  BRCA-related cancer screening. This may be done if you have a family history of breast, ovarian, tubal, or peritoneal cancers. Other tests  Sexually transmitted disease (STD) testing.  Bone density scan. This is done to screen for osteoporosis. You may have this done starting at age 24. Follow these instructions at home: Eating and drinking  Eat a diet that includes fresh fruits and vegetables, whole grains, lean protein, and low-fat dairy products. Limit your intake of foods with high amounts of sugar, saturated fats, and salt.  Take vitamin and mineral supplements as recommended by your health care provider.  Do not drink alcohol if your health care provider tells you not to drink.  If you drink alcohol: ? Limit how much you have to 0-1 drink a day. ? Be aware of how much alcohol is in your drink. In the [place] one drink equals one 12 oz bottle of beer (355 mL), one 5 oz glass of wine (148 mL), or one 1 oz glass of hard liquor (44 mL). Lifestyle  Take daily care of your teeth and gums.  Stay active. Exercise for at least 30 minutes on 5 or more days each week.  Do not use  any products that contain nicotine or tobacco, such as cigarettes, e-cigarettes, and chewing tobacco. If you need help quitting, ask your health care provider.  If you are sexually active, practice safe sex. Use a condom or other form of protection in order to prevent STIs (sexually transmitted infections).  Talk with your health care provider about taking a low-dose aspirin or statin. What's next?  Go to your health care provider once a year for a well check visit.  Ask your health care provider how often you should have your eyes and teeth checked.  Stay up to date on all vaccines. This information is not intended to replace advice given to you by your health care provider. Make sure you discuss any questions you have with your health care provider. Document Revised: 02/11/2018 Document Reviewed: 02/11/2018 Elsevier Patient Education  2020 [employer].

## 2019-05-07 ENCOUNTER — Other Ambulatory Visit: Payer: Self-pay | Admitting: Nurse Practitioner

## 2019-05-07 DIAGNOSIS — J4520 Mild intermittent asthma, uncomplicated: Secondary | ICD-10-CM

## 2019-05-09 NOTE — Progress Notes (Signed)
Medical screening examination/treatment/procedure(s) were performed by the Wellness Coach, RN. As primary care provider I was immediately available for consulation/collaboration. I agree with above documentation. Nyelle Wolfson, AGNP-C 

## 2019-05-23 ENCOUNTER — Ambulatory Visit (INDEPENDENT_AMBULATORY_CARE_PROVIDER_SITE_OTHER): Payer: Medicare Other | Admitting: Nurse Practitioner

## 2019-05-23 ENCOUNTER — Encounter: Payer: Self-pay | Admitting: Nurse Practitioner

## 2019-05-23 ENCOUNTER — Other Ambulatory Visit: Payer: Self-pay

## 2019-05-23 VITALS — BP 128/83 | HR 68 | Temp 97.1°F | Ht 62.0 in | Wt 277.8 lb

## 2019-05-23 DIAGNOSIS — K2100 Gastro-esophageal reflux disease with esophagitis, without bleeding: Secondary | ICD-10-CM | POA: Diagnosis not present

## 2019-05-23 DIAGNOSIS — I10 Essential (primary) hypertension: Secondary | ICD-10-CM | POA: Diagnosis not present

## 2019-05-23 DIAGNOSIS — J4521 Mild intermittent asthma with (acute) exacerbation: Secondary | ICD-10-CM

## 2019-05-23 DIAGNOSIS — E1142 Type 2 diabetes mellitus with diabetic polyneuropathy: Secondary | ICD-10-CM | POA: Diagnosis not present

## 2019-05-23 MED ORDER — SYMBICORT 160-4.5 MCG/ACT IN AERO: 2.0000 | INHALATION_SPRAY | Freq: Two times a day (BID) | RESPIRATORY_TRACT | 5 refills | Status: DC

## 2019-05-23 MED ORDER — LISINOPRIL-HYDROCHLOROTHIAZIDE 10-12.5 MG PO TABS: 1.0000 | ORAL_TABLET | Freq: Every day | ORAL | 1 refills | Status: DC

## 2019-05-23 MED ORDER — OMEPRAZOLE 20 MG PO CPDR: 20.0000 mg | DELAYED_RELEASE_CAPSULE | Freq: Two times a day (BID) | ORAL | 0 refills | Status: DC

## 2019-05-23 NOTE — Progress Notes (Signed)
Subjective:  Patient ID: Sandra Brennan, female    DOB: [birth year]  Age: 74 y.o. MRN: [id number]  CC: Follow-up (Pt said BP is stable and feels ok//had first moderna covid shot two weeks ago//pt took topiramate but stopped felt weird//looking for eye Dr//allergic to tetanus)  HPI HTN: BP at goal BP Readings from Last 3 Encounters:  05/23/19 128/83  03/08/19 (!) 146/76  02/22/19 (!) 139/97   Asthma: Stable with symbicort. No need for albuterol.  GERD: Reports Heart burn and nausea, onset several months ago, waxing and waning, does not change with position, no improvement with TUMs No melena, no ABD pain, no weight loss  DM: Controlled with diet Positive neuropathy, use of lyrica Negative nephropathy She will schedule annual eye exam LDL at goal  Reviewed past Medical, Social and Family history today.  Outpatient Medications Prior to Visit  Medication Sig Dispense Refill   albuterol (VENTOLIN HFA) 108 (90 Base) MCG/ACT inhaler INHALE 1-2 PUFFS EVERY 6 (SIX) HOURS AS NEEDED FOR WHEEZING OR SHORTNESS OF BREATH. NEEDS 6.7 g 0   allopurinol (ZYLOPRIM) 100 MG tablet Take 1 tablet (100 mg total) by mouth daily. 90 tablet 3   aspirin EC 81 MG tablet Take 81 mg by mouth daily.     atorvastatin (LIPITOR) 10 MG tablet Take 1 tablet (10 mg total) by mouth daily at 6 PM. 90 tablet 3   Carboxymethylcellulose Sodium (EYE DROPS OP) Apply to eye. Lubricant eye drops     celecoxib (CELEBREX) 200 MG capsule TAKE 1 CAPSULE BY MOUTH TWICE A DAY 60 capsule 0   cetirizine (ZYRTEC) 10 MG tablet Take 10 mg by mouth daily.     cholecalciferol (VITAMIN D) 1000 units tablet Take 1 tablet (1,000 Units total) by mouth daily. (Patient taking differently: Take 1,000 Units by mouth 2 (two) times daily. )     cycloSPORINE (RESTASIS) 0.05 % ophthalmic emulsion Restasis 0.05 % eye drops in a dropperette     Multiple Vitamin (MULTIVITAMIN) tablet Take 1 tablet by mouth daily.     nystatin  (MYCOSTATIN/NYSTOP) powder Apply 1 application topically 2 (two) times daily. 45 g 1   OVER THE COUNTER MEDICATION Cystic magnesium     pregabalin (LYRICA) 100 MG capsule Take 1 capsule (100 mg total) by mouth 2 (two) times daily. 60 capsule 5   Sennosides 8.6 MG CAPS Take by mouth.     traMADol (ULTRAM) 50 MG tablet Take 1 tablet (50 mg total) by mouth 2 (two) times daily. 60 tablet 5   lisinopril-hydrochlorothiazide (ZESTORETIC) 10-12.5 MG tablet Take 1 tablet by mouth daily. 90 tablet 1   omeprazole (PRILOSEC) 20 MG capsule Take 1 capsule (20 mg total) by mouth daily. 1 capsule 0   SYMBICORT 160-4.5 MCG/ACT inhaler INHALE 1 PUFF INTO THE LUNGS 2 (TWO) TIMES DAILY FOR 30 DAYS. 10.2 Inhaler 5   topiramate (TOPAMAX) 25 MG tablet TAKE 1 TABLET AT BEDTIME X 3 DAYS, THEN TAKE 2 TABLETS X 3 DAYS, THEN TAKE 3 TABLETS AT BEDTIME     No facility-administered medications prior to visit.    ROS See HPI  Objective:  BP 128/83    Pulse 68    Temp (!) 97.1 F (36.2 C) (Tympanic)    Ht 5\' 2"  (1.575 m)    Wt 277 lb 12.8 oz (126 kg)    SpO2 97%    BMI 50.81 kg/m   BP Readings from Last 3 Encounters:  05/23/19 128/83  03/08/19 (!) 146/76  02/22/19 Marland Kitchen)  139/97    Wt Readings from Last 3 Encounters:  05/23/19 277 lb 12.8 oz (126 kg)  02/22/19 272 lb (123.4 kg)  02/01/19 271 lb (122.9 kg)    Physical Exam Vitals reviewed.  Constitutional:      Appearance: She is obese.  Cardiovascular:     Rate and Rhythm: Normal rate and regular rhythm.     Pulses: Normal pulses.     Heart sounds: Normal heart sounds.  Pulmonary:     Effort: Pulmonary effort is normal.     Breath sounds: Normal breath sounds.  Abdominal:     Tenderness: There is no abdominal tenderness. There is no guarding.  Musculoskeletal:     Right lower leg: No edema.     Left lower leg: No edema.  Skin:    General: Skin is warm and dry.  Neurological:     Mental Status: She is alert and oriented to person, place, and  time.  Psychiatric:        Mood and Affect: Mood normal.        Behavior: Behavior normal.        Thought Content: Thought content normal.     Lab Results  Component Value Date   WBC 10.0 03/07/2019   HGB 10.6 (L) 03/07/2019   HCT 35.3 (L) 03/07/2019   PLT 238 03/07/2019   GLUCOSE 107 (H) 03/07/2019   CHOL 129 02/28/2019   TRIG 57.0 02/28/2019   HDL 59.80 02/28/2019   LDLCALC 58 02/28/2019   ALT 17 03/07/2019   AST 28 03/07/2019   NA 141 03/07/2019   K 4.6 03/07/2019   CL 105 03/07/2019   CREATININE 1.33 (H) 03/07/2019   BUN 14 03/07/2019   CO2 27 03/07/2019   TSH 2.80 02/26/2017   INR 1.17 08/27/2012   HGBA1C 5.8 02/28/2019   MICROALBUR <0.7 08/31/2017    Assessment & Plan:  This visit occurred during the SARS-CoV-2 public health emergency.  Safety protocols were in place, including screening questions prior to the visit, additional usage of staff PPE, and extensive cleaning of exam room while observing appropriate contact time as indicated for disinfecting solutions.   Sandra Brennan was seen today for follow-up.  Diagnoses and all orders for this visit:  Essential hypertension -     lisinopril-hydrochlorothiazide (ZESTORETIC) 10-12.5 MG tablet; Take 1 tablet by mouth daily.  Type 2 diabetes mellitus with diabetic polyneuropathy, without long-term current use of insulin (HCC)  Gastroesophageal reflux disease with esophagitis without hemorrhage -     omeprazole (PRILOSEC) 20 MG capsule; Take 1 capsule (20 mg total) by mouth 2 (two) times daily before a meal.  Mild intermittent chronic asthma with acute exacerbation -     SYMBICORT 160-4.5 MCG/ACT inhaler; Inhale 2 puffs into the lungs in the morning and at bedtime. Rinse mouth after each use   I have changed Sandra Brennan's omeprazole and Symbicort. I am also having her maintain her aspirin EC, Sennosides, cycloSPORINE, cholecalciferol, Carboxymethylcellulose Sodium (EYE DROPS OP), cetirizine, OVER THE COUNTER  MEDICATION, celecoxib, pregabalin, traMADol, multivitamin, nystatin, allopurinol, atorvastatin, albuterol, topiramate, and lisinopril-hydrochlorothiazide.  Meds ordered this encounter  Medications   omeprazole (PRILOSEC) 20 MG capsule    Sig: Take 1 capsule (20 mg total) by mouth 2 (two) times daily before a meal.    Dispense:  28 capsule    Refill:  0    Order Specific Question:   Supervising Provider    Answer:   Ronnald Nian [id number]   [place]  160-4.5 MCG/ACT inhaler    Sig: Inhale 2 puffs into the lungs in the morning and at bedtime. Rinse mouth after each use    Dispense:  1 Inhaler    Refill:  5    Order Specific Question:   Supervising Provider    Answer:   Ronnald Nian [id number]   lisinopril-hydrochlorothiazide (ZESTORETIC) 10-12.5 MG tablet    Sig: Take 1 tablet by mouth daily.    Dispense:  90 tablet    Refill:  1    Order Specific Question:   Supervising Provider    Answer:   Ronnald Nian [id number]    Problem List Items Addressed This Visit      Cardiovascular and Mediastinum   Hypertension - Primary (Chronic)   Relevant Medications   lisinopril-hydrochlorothiazide (ZESTORETIC) 10-12.5 MG tablet     Respiratory   Asthma, chronic (Chronic)   Relevant Medications   SYMBICORT 160-4.5 MCG/ACT inhaler     Digestive   GERD with esophagitis   Relevant Medications   omeprazole (PRILOSEC) 20 MG capsule     Endocrine   Type 2 diabetes mellitus with diabetic polyneuropathy, without long-term current use of insulin (HCC) (Chronic)   Relevant Medications   topiramate (TOPAMAX) 25 MG tablet   lisinopril-hydrochlorothiazide (ZESTORETIC) 10-12.5 MG tablet      Follow-up: Return in about 3 months (around 08/23/2019) for DM and HTN, hyperlipidemia (F2F, fasting, 23mins).  Wilfred Lacy, NP

## 2019-05-23 NOTE — Patient Instructions (Addendum)
Start omperazole BID for GERD symptoms  make sure you are taking celebrex with food. Call office if not improvement in 2weeks

## 2019-05-28 ENCOUNTER — Other Ambulatory Visit: Payer: Self-pay | Admitting: Nurse Practitioner

## 2019-05-28 DIAGNOSIS — J4520 Mild intermittent asthma, uncomplicated: Secondary | ICD-10-CM

## 2019-06-04 ENCOUNTER — Other Ambulatory Visit: Payer: Self-pay | Admitting: Neurology

## 2019-06-07 ENCOUNTER — Ambulatory Visit: Payer: Medicare Other | Admitting: Neurology

## 2019-06-07 DIAGNOSIS — G4733 Obstructive sleep apnea (adult) (pediatric): Secondary | ICD-10-CM | POA: Diagnosis not present

## 2019-06-07 NOTE — Progress Notes (Deleted)
PATIENT: Sandra Brennan DOB: [birth year]  REASON FOR VISIT: follow up HISTORY FROM: patient  HISTORY OF PRESENT ILLNESS: Today 06/07/19  Sandra Brennan is a 74 year old female with history of chronic migraine headache and obstructive sleep apnea on CPAP.  When last seen was started on Topamax.   Today 12/09/18  Sandra Brennan is a 74 year old female with history of chronic migraine headaches, and obstructive sleep apnea on CPAP.  She presents today for follow-up.  After last visit, she was taken off nortriptyline due to concern for interaction while taking tramadol for chronic pain and nortriptyline.  She is not on any headache prevention medications.  She says on average she may have 2-3 migraines a month.  She describes a daily mild headache.  She will use Tylenol with good benefit, or special headache packs provided by her insurance company.  She has continued to gain weight as result of stress and poor eating habits.  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  Her CPAP download indicates excellent compliance, 93% use over 30 days.  She uses her machine greater than 4 hours every night.  Her AHI was optimal at 1.7, she had a mild leak of 22.6 at the 95th percentile. Her set pressure is 6 cmH2O. She does report feeling that the machine dries her mouth out.  She denies noticing a significant leak in the mask.  She uses [facility] for her equipment.  She uses a cane for ambulation when she goes out.  Her blood pressure is elevated today, but she has not taken her medications.  She presents today for follow-up unaccompanied.  HISTORY 05/03/18 SS:Sandra Brennan is a 74 year old female who presents for follow-up for chronic migraine headaches. At her last visit in July 2019 her nortriptyline was increased to 30 mg at bedtime. 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 She reports that her headaches are better however she says on average she has  about 1-2 bad headaches per month. She reports the headache will start behind her eyes and spread to bilateral temples. She will take Tylenol for the headache and the headache is relieved within 1 hour. She also reports a weight gain of about 60 pounds in the last year. She reports she continues to use her CPAP machine and denies any problems with the equipment. She denies any new problems or concerns today she presents today for follow-up unaccompanied.  09/10/2017 MM: Sandra Brennan is a 74 year old female with a history of obstructive sleep apnea on CPAP and migraine headaches. She returns today for follow-up. Her CPAP download indicates that she use her machine nightly for compliance of 100%. She use her machine greater than 4 hours each night. On average she uses her machine 7 hours and 15 minutes. Her residual AHI is 0.5 on 6 cm of water with EPR of 1. She does not have a significant leak. She reports that the CPAP continues to work well for her. She has been seeing Dr. Krista Blue for migraine headaches. She states that nortriptyline was working well but recently she has been having a daily headache. She states that she does keep a 77-year-old during the day as well. She reports that with her headaches she does have photophobia and phonophobia but denies nausea and vomiting. She returns today for an evaluation.  HISTORY   REVIEW OF SYSTEMS: Out of a complete 14 system review of symptoms, the patient complains only of  the following symptoms, and all other reviewed systems are negative.  ALLERGIES: Allergies  Allergen Reactions  . Cymbalta [Duloxetine Hcl] Anaphylaxis  . Pineapple Shortness Of Breath  . Influenza Vaccines Hives and Itching  . Penicillins Hives  . Sulfa Antibiotics Other (See Comments)    REACTION: unknown  . Theophyllines Hives    HOME MEDICATIONS: Outpatient Medications Prior to Visit  Medication Sig Dispense Refill  . albuterol (VENTOLIN HFA) 108 (90 Base) MCG/ACT  inhaler INHALE 1-2 PUFFS EVERY 6 (SIX) HOURS AS NEEDED FOR WHEEZING OR SHORTNESS OF BREATH. NEEDS 6.7 g 0  . allopurinol (ZYLOPRIM) 100 MG tablet Take 1 tablet (100 mg total) by mouth daily. 90 tablet 3  . aspirin EC 81 MG tablet Take 81 mg by mouth daily.    Marland Kitchen atorvastatin (LIPITOR) 10 MG tablet Take 1 tablet (10 mg total) by mouth daily at 6 PM. 90 tablet 3  . Carboxymethylcellulose Sodium (EYE DROPS OP) Apply to eye. Lubricant eye drops    . celecoxib (CELEBREX) 200 MG capsule TAKE 1 CAPSULE BY MOUTH TWICE A DAY 60 capsule 0  . cetirizine (ZYRTEC) 10 MG tablet Take 10 mg by mouth daily.    . cholecalciferol (VITAMIN D) 1000 units tablet Take 1 tablet (1,000 Units total) by mouth daily. (Patient taking differently: Take 1,000 Units by mouth 2 (two) times daily. )    . cycloSPORINE (RESTASIS) 0.05 % ophthalmic emulsion Restasis 0.05 % eye drops in a dropperette    . lisinopril-hydrochlorothiazide (ZESTORETIC) 10-12.5 MG tablet Take 1 tablet by mouth daily. 90 tablet 1  . Multiple Vitamin (MULTIVITAMIN) tablet Take 1 tablet by mouth daily.    Marland Kitchen nystatin (MYCOSTATIN/NYSTOP) powder Apply 1 application topically 2 (two) times daily. 45 g 1  . omeprazole (PRILOSEC) 20 MG capsule Take 1 capsule (20 mg total) by mouth 2 (two) times daily before a meal. 28 capsule 0  . OVER THE COUNTER MEDICATION Cystic magnesium    . pregabalin (LYRICA) 100 MG capsule Take 1 capsule (100 mg total) by mouth 2 (two) times daily. 60 capsule 5  . Sennosides 8.6 MG CAPS Take by mouth.    . SYMBICORT 160-4.5 MCG/ACT inhaler Inhale 2 puffs into the lungs in the morning and at bedtime. Rinse mouth after each use 1 Inhaler 5  . topiramate (TOPAMAX) 25 MG tablet TAKE 1 TABLET AT BEDTIME X 3 DAYS, THEN TAKE 2 TABLETS X 3 DAYS, THEN TAKE 3 TABLETS AT BEDTIME    . traMADol (ULTRAM) 50 MG tablet Take 1 tablet (50 mg total) by mouth 2 (two) times daily. 60 tablet 5   No facility-administered medications prior to visit.    PAST  MEDICAL HISTORY: Past Medical History:  Diagnosis Date  . Anemia   . Arthritis    "plenty" (2012-08-31)  . Asthma   . Chronic lower back pain    "they say I need a whole new spinal column" (31-Aug-2012)  . COPD (chronic obstructive pulmonary disease) ([place])   . Degenerative arthritis    "all over" (08-31-2012)  . Fx ankle   . GERD (gastroesophageal reflux disease)   . Gout 05/05/2013  . Hypertension   . Migraines    "used to; totally stopped when I quit drinking 30 yr ago" (08-31-2012)  . Myalgia and myositis   . Myocardial infarction [facility]) 1992   Aug 31, 2012 "mild MI when son died "  . Obesity   . Osteoporosis    "all over" (08-31-2012)  . Shortness of breath  when stressed.  . Sleep apnea    "never did fix my machine; haven't used one for 5 years; I've lost 116# since then; no problems now" (08/03/2012)  . Type II diabetes mellitus ([place])    "borderline; don't test; take Metformin" (08/03/2012)    PAST SURGICAL HISTORY: Past Surgical History:  Procedure Laterality Date  . ABDOMINAL HYSTERECTOMY  1990's  . GANGLION CYST EXCISION Right 1980's   "wrist" (08/03/2012)  . JOINT REPLACEMENT    . KNEE LIGAMENT RECONSTRUCTION Right 1980's  . TONSILLECTOMY  ~ 1954  . TOTAL HIP ARTHROPLASTY Left 08/03/2012  . TOTAL HIP ARTHROPLASTY Left 08/03/2012   Procedure: TOTAL HIP ARTHROPLASTY;  Surgeon: Garald Balding, MD;  Location: [place];  Service: Orthopedics;  Laterality: Left;  Left Total Hip Arthroplasty  . TOTAL HIP ARTHROPLASTY Left 08/28/2012   Procedure: Irrigation and Debridement hip ;  Surgeon: Mcarthur Rossetti, MD;  Location: [place];  Service: Orthopedics;  Laterality: Left;  . TOTAL KNEE ARTHROPLASTY Left 2001  . TOTAL KNEE ARTHROPLASTY Right 2004    FAMILY HISTORY: Family History  Problem Relation Age of Onset  . Arthritis Mother   . Arthritis Father   . Colon cancer Father   . Diabetes Sister   . Arthritis Sister   . Diabetes Maternal Grandmother   . Arthritis Maternal  Grandmother   . Diabetes Maternal Grandfather   . Arthritis Maternal Grandfather     SOCIAL HISTORY: Social History   Socioeconomic History  . Marital status: Widowed    Spouse name: Not on file  . Number of children: 5  . Years of education: PhD  . Highest education level: Not on file  Occupational History  . Occupation: Retired  Tobacco Use  . Smoking status: Former Smoker    Packs/day: 0.12    Years: 2.00    Pack years: 0.24    Types: Cigarettes  . Smokeless tobacco: Never Used  . Tobacco comment: 08/03/2012 "quit 30 years ago"  Substance and Sexual Activity  . Alcohol use: No    Alcohol/week: 0.0 standard drinks    Comment: 08/03/2012 "used to be a beeralcoholic"; stopped ~ 30 yr ago"  . Drug use: No  . Sexual activity: Never  Other Topics Concern  . Not on file  Social History Narrative   Lives at home with her mother and caregiver.   Occasional use of caffeine.   Right-handed.   Social Determinants of Health   Financial Resource Strain: Low Risk   . Difficulty of Paying Living Expenses: Not hard at all  Food Insecurity: No Food Insecurity  . Worried About Charity fundraiser in the Last Year: Never true  . Ran Out of Food in the Last Year: Never true  Transportation Needs: No Transportation Needs  . Lack of Transportation (Medical): No  . Lack of Transportation (Non-Medical): No  Physical Activity:   . Days of Exercise per Week:   . Minutes of Exercise per Session:   Stress:   . Feeling of Stress :   Social Connections:   . Frequency of Communication with Friends and Family:   . Frequency of Social Gatherings with Friends and Family:   . Attends Religious Services:   . Active Member of Clubs or Organizations:   . Attends Archivist Meetings:   Marland Kitchen Marital Status:   Intimate Partner Violence:   . Fear of Current or Ex-Partner:   . Emotionally Abused:   Marland Kitchen Physically Abused:   . Sexually  Abused:       PHYSICAL EXAM  There were no vitals  filed for this visit. There is no height or weight on file to calculate BMI.  Generalized: Well developed, in no acute distress   Neurological examination  Mentation: Alert oriented to time, place, history taking. Follows all commands speech and language fluent Cranial nerve II-XII: Pupils were equal round reactive to light. Extraocular movements were full, visual field were full on confrontational test. Facial sensation and strength were normal. Uvula tongue midline. Head turning and shoulder shrug  were normal and symmetric. Motor: The motor testing reveals 5 over 5 strength of all 4 extremities. Good symmetric motor tone is noted throughout.  Sensory: Sensory testing is intact to soft touch on all 4 extremities. No evidence of extinction is noted.  Coordination: Cerebellar testing reveals good finger-nose-finger and heel-to-shin bilaterally.  Gait and station: Gait is normal. Tandem gait is normal. Romberg is negative. No drift is seen.  Reflexes: Deep tendon reflexes are symmetric and normal bilaterally.   DIAGNOSTIC DATA (LABS, IMAGING, TESTING) - I reviewed patient records, labs, notes, testing and imaging myself where available.  Lab Results  Component Value Date   WBC 10.0 03/07/2019   HGB 10.6 (L) 03/07/2019   HCT 35.3 (L) 03/07/2019   MCV 95.9 03/07/2019   PLT 238 03/07/2019      Component Value Date/Time   NA 141 03/07/2019 2115   NA 142 07/14/2014 1014   K 4.6 03/07/2019 2115   CL 105 03/07/2019 2115   CO2 27 03/07/2019 2115   GLUCOSE 107 (H) 03/07/2019 2115   BUN 14 03/07/2019 2115   BUN 12 07/14/2014 1014   CREATININE 1.33 (H) 03/07/2019 2115   CREATININE 1.39 (H) 04/09/2018 1549   CALCIUM 8.9 03/07/2019 2115   PROT 6.9 03/07/2019 2115   PROT 6.9 07/14/2014 1014   ALBUMIN 3.2 (L) 03/07/2019 2115   ALBUMIN 4.3 07/14/2014 1014   AST 28 03/07/2019 2115   ALT 17 03/07/2019 2115   ALKPHOS 76 03/07/2019 2115   BILITOT 0.3 03/07/2019 2115   BILITOT 0.2 07/14/2014  1014   GFRNONAA 40 (L) 03/07/2019 2115   GFRAA 46 (L) 03/07/2019 2115   Lab Results  Component Value Date   CHOL 129 02/28/2019   HDL 59.80 02/28/2019   LDLCALC 58 02/28/2019   TRIG 57.0 02/28/2019   CHOLHDL 2 02/28/2019   Lab Results  Component Value Date   HGBA1C 5.8 02/28/2019   Lab Results  Component Value Date   VITAMINB12 307 07/14/2014   Lab Results  Component Value Date   TSH 2.80 02/26/2017      ASSESSMENT AND PLAN 74 y.o. year old female  has a past medical history of Anemia, Arthritis, Asthma, Chronic lower back pain, COPD (chronic obstructive pulmonary disease) ([place]), Degenerative arthritis, Fx ankle, GERD (gastroesophageal reflux disease), Gout (05/05/2013), Hypertension, Migraines, Myalgia and myositis, Myocardial infarction ([place]) (1992), Obesity, Osteoporosis, Shortness of breath, Sleep apnea, and Type II diabetes mellitus ([place]). here with ***   I spent 15 minutes with the patient. 50% of this time was spent   Butler Denmark, Trail, DNP 06/07/2019, 5:27 AM [facility] [address] [place],  [address] [phone]

## 2019-07-11 ENCOUNTER — Other Ambulatory Visit: Payer: Self-pay | Admitting: Nurse Practitioner

## 2019-07-11 DIAGNOSIS — J4520 Mild intermittent asthma, uncomplicated: Secondary | ICD-10-CM

## 2019-07-12 ENCOUNTER — Ambulatory Visit: Payer: Self-pay

## 2019-07-12 ENCOUNTER — Ambulatory Visit (INDEPENDENT_AMBULATORY_CARE_PROVIDER_SITE_OTHER): Payer: Medicare Other | Admitting: Orthopaedic Surgery

## 2019-07-12 ENCOUNTER — Other Ambulatory Visit: Payer: Self-pay

## 2019-07-12 ENCOUNTER — Encounter: Payer: Self-pay | Admitting: Orthopaedic Surgery

## 2019-07-12 VITALS — Ht 62.0 in

## 2019-07-12 DIAGNOSIS — M5136 Other intervertebral disc degeneration, lumbar region: Secondary | ICD-10-CM

## 2019-07-12 DIAGNOSIS — T84068S Wear of articular bearing surface of other internal prosthetic joint, sequela: Secondary | ICD-10-CM

## 2019-07-12 DIAGNOSIS — M4807 Spinal stenosis, lumbosacral region: Secondary | ICD-10-CM

## 2019-07-12 DIAGNOSIS — M5410 Radiculopathy, site unspecified: Secondary | ICD-10-CM

## 2019-07-12 DIAGNOSIS — M47816 Spondylosis without myelopathy or radiculopathy, lumbar region: Secondary | ICD-10-CM | POA: Diagnosis not present

## 2019-07-12 DIAGNOSIS — Z96649 Presence of unspecified artificial hip joint: Secondary | ICD-10-CM | POA: Diagnosis not present

## 2019-07-12 DIAGNOSIS — T84068A Wear of articular bearing surface of other internal prosthetic joint, initial encounter: Secondary | ICD-10-CM | POA: Insufficient documentation

## 2019-07-12 NOTE — Progress Notes (Signed)
Office Visit Note   Patient: Sandra Brennan           Date of Birth: 03/22/1945           MRN: [id number] Visit Date: 07/12/2019              Requested by: Flossie Buffy, NP [place] [id number] PCP: Flossie Buffy, NP   Assessment & Plan: Visit Diagnoses:  1. Radicular pain   2. Spondylosis without myelopathy or radiculopathy, lumbar region   3. Polyethylene liner wear following total hip arthroplasty requiring isolated polyethylene liner exchange, sequela   4. Other intervertebral disc degeneration, lumbar region   5. Spinal stenosis of lumbosacral region   4.     Exogenous obesity          Plan:  #1:  [name] has tramadol from her pain management that she can use.. #2: We are going to order an MRI scan of her lumbar spine looking for her root causes of her pain especially into her left lower extremity #3: Follow back up after MRI scan  Face-to-face time spent with patient was greater than 40 minutes.  Greater than 50% of the time was spent in counseling and coordination of care.  Follow-Up Instructions: Return in about 2 weeks (around 07/26/2019).   Orders:  Orders Placed This Encounter  Procedures  . XR Lumbar Spine 2-3 Views  . XR HIP UNILAT W OR W/O PELVIS 2-3 VIEWS LEFT  . MR Lumbar Spine w/o contrast   No orders of the defined types were placed in this encounter.     Procedures: No procedures performed   Clinical Data: No additional findings.   Subjective: Chief Complaint  Patient presents with  . Left Hip - Pain   HPI Patient presents today for left hip pain and posterior leg pain.. She has a history of a left total hip arthroplasty that was done in June of 2014 and has been noted to have polywear.Marland Kitchen She was here last in 2019 for bilateral hip pain. She said that the pain has continued since her last visit. Her pain is located in her buttock and radiates laterally to her feet. She said that she has pain with  standing and has to be careful when sitting. She generally has to elevate her left hip when sitting. She does have groin pain. No numbness or tingling. She is taking tylenol or tramadol. She gets the tramadol from pain management.    Review of Systems  Constitutional: Negative for fatigue.  HENT: Negative for ear pain.   Eyes: Negative for pain.  Respiratory: Negative for shortness of breath.   Cardiovascular: Positive for leg swelling.  Gastrointestinal: Positive for constipation. Negative for diarrhea.  Endocrine: Negative for cold intolerance and heat intolerance.  Genitourinary: Positive for difficulty urinating.  Musculoskeletal: Negative for joint swelling.  Skin: Negative for rash.  Allergic/Immunologic: Positive for food allergies.  Neurological: Negative for weakness.  Hematological: Does not bruise/bleed easily.  Psychiatric/Behavioral: Positive for sleep disturbance.     Objective: Vital Signs: Ht 5\' 2"  (1.575 m)   BMI 50.81 kg/m   Physical Exam Constitutional:      Appearance: Normal appearance. She is well-developed. She is obese.  HENT:     Head: Normocephalic.  Eyes:     Pupils: Pupils are equal, round, and reactive to light.  Pulmonary:     Effort: Pulmonary effort is normal.  Skin:    General: Skin is warm  and dry.  Neurological:     Mental Status: She is alert and oriented to person, place, and time.  Psychiatric:        Behavior: Behavior normal.     Ortho Exam  Today she does have I positive straight leg raise on the left.  Deep tendon reflexes are absent in both lower extremities.  Her strength is 4-5/5 in the lower extremities.  Sensation is intact to light touch.  She does have pain in left hip with internal and external rotation in sitting position.  Unable to feel pulses secondary to her size in the lower extremities.  Specialty Comments:  No specialty comments available.  Imaging: XR HIP UNILAT W OR W/O PELVIS 2-3 VIEWS LEFT  Result Date:  07/12/2019 Three-view x-ray of the pelvis and left hip reveals superior positioning of the left femoral head in the total hip replacement.  She does have some SI joint changes more on the right than left.  She does have essentially bone-on-bone right hip with sclerosing of the acetabulum and the femoral head.  XR Lumbar Spine 2-3 Views  Result Date: 07/12/2019 X-rays 2 view of the lumbar spine reveals marked degenerative changes in the lumbar spine causing a scoliotic deformity apex to the left.  Apex is around L3-4.  She has marked degenerative changes diffusely throughout the lumbar spine.  The foramen do appear narrowed in the entire lumbar spine.  There is anterior spurring L2-3 with questionable narrowing of the posterior portion of the vertebral body L3.  L1-2 appears to be possibly totally fused.  L4-5 has a grade 1 spondylolisthesis.     PMFS History: Current Outpatient Medications  Medication Sig Dispense Refill  . albuterol (VENTOLIN HFA) 108 (90 Base) MCG/ACT inhaler INHALE 1-2 PUFFS EVERY 6 (SIX) HOURS AS NEEDED FOR WHEEZING OR SHORTNESS OF BREATH. NEEDS 6.7 g 0  . allopurinol (ZYLOPRIM) 100 MG tablet Take 1 tablet (100 mg total) by mouth daily. 90 tablet 3  . aspirin EC 81 MG tablet Take 81 mg by mouth daily.    Marland Kitchen atorvastatin (LIPITOR) 10 MG tablet Take 1 tablet (10 mg total) by mouth daily at 6 PM. 90 tablet 3  . Carboxymethylcellulose Sodium (EYE DROPS OP) Apply to eye. Lubricant eye drops    . celecoxib (CELEBREX) 200 MG capsule TAKE 1 CAPSULE BY MOUTH TWICE A DAY 60 capsule 0  . cetirizine (ZYRTEC) 10 MG tablet Take 10 mg by mouth daily.    . cholecalciferol (VITAMIN D) 1000 units tablet Take 1 tablet (1,000 Units total) by mouth daily. (Patient taking differently: Take 1,000 Units by mouth 2 (two) times daily. )    . cycloSPORINE (RESTASIS) 0.05 % ophthalmic emulsion Restasis 0.05 % eye drops in a dropperette    . lisinopril-hydrochlorothiazide (ZESTORETIC) 10-12.5 MG tablet  Take 1 tablet by mouth daily. 90 tablet 1  . Multiple Vitamin (MULTIVITAMIN) tablet Take 1 tablet by mouth daily.    Marland Kitchen nystatin (MYCOSTATIN/NYSTOP) powder Apply 1 application topically 2 (two) times daily. 45 g 1  . omeprazole (PRILOSEC) 20 MG capsule Take 1 capsule (20 mg total) by mouth 2 (two) times daily before a meal. 28 capsule 0  . OVER THE COUNTER MEDICATION Cystic magnesium    . pregabalin (LYRICA) 100 MG capsule Take 1 capsule (100 mg total) by mouth 2 (two) times daily. 60 capsule 5  . Sennosides 8.6 MG CAPS Take by mouth.    . SYMBICORT 160-4.5 MCG/ACT inhaler Inhale 2 puffs into the lungs  in the morning and at bedtime. Rinse mouth after each use 1 Inhaler 5  . topiramate (TOPAMAX) 25 MG tablet TAKE 1 TABLET AT BEDTIME X 3 DAYS, THEN TAKE 2 TABLETS X 3 DAYS, THEN TAKE 3 TABLETS AT BEDTIME    . traMADol (ULTRAM) 50 MG tablet Take 1 tablet (50 mg total) by mouth 2 (two) times daily. 60 tablet 5   No current facility-administered medications for this visit.    Patient Active Problem List   Diagnosis Date Noted  . Polyethylene liner wear following total hip arthroplasty requiring isolated polyethylene liner exchange ([place]) 07/12/2019  . Adjustment disorder with mixed anxiety and depressed mood 07/12/2018  . Migraine without aura and without status migrainosus, not intractable 09/10/2017  . Post-traumatic osteoarthritis of right ankle 06/09/2016  . Spondylosis without myelopathy or radiculopathy, lumbar region 06/09/2016  . Cervical spinal stenosis 06/09/2016  . Spinal stenosis, lumbar region, without neurogenic claudication 06/09/2016  . Chronic gouty arthritis 06/04/2016  . Vitamin D deficiency 06/02/2016  . Hyperlipidemia 06/02/2016  . Closed right ankle fracture 05/01/2016  . Colon polyps 05/01/2016  . Fibromyalgia syndrome 05/01/2016  . Syncope 03/30/2016  . Chest pain 03/30/2016  . Fall 03/30/2016  . GERD with esophagitis 06/21/2015  . Pharyngeal dysphagia 06/21/2015  .  Obesity 05/06/2013  . Acute nontraumatic kidney injury ([place]) 05/05/2013  . Acute posthemorrhagic anemia 09/06/2012  . Gout 09/05/2012  . Osteoarthritis of left hip 08/05/2012  . Type 2 diabetes mellitus with diabetic polyneuropathy, without long-term current use of insulin ([place]) 08/05/2012  . Hypertension 08/05/2012  . Asthma, chronic 08/05/2012  . Sleep apnea 08/05/2012   Past Medical History:  Diagnosis Date  . Anemia   . Arthritis    "plenty" (08/08/12)  . Asthma   . Chronic lower back pain    "they say I need a whole new spinal column" (2012/08/08)  . COPD (chronic obstructive pulmonary disease) ([place])   . Degenerative arthritis    "all over" (Aug 08, 2012)  . Fx ankle   . GERD (gastroesophageal reflux disease)   . Gout 05/05/2013  . Hypertension   . Migraines    "used to; totally stopped when I quit drinking 30 yr ago" (2012/08/08)  . Myalgia and myositis   . Myocardial infarction [facility]) 1992   2012-08-08 "mild MI when son died "  . Obesity   . Osteoporosis    "all over" (08-08-2012)  . Shortness of breath    when stressed.  . Sleep apnea    "never did fix my machine; haven't used one for 5 years; I've lost 116# since then; no problems now" (2012/08/08)  . Type II diabetes mellitus ([place])    "borderline; don't test; take Metformin" (08-08-2012)    Family History  Problem Relation Age of Onset  . Arthritis Mother   . Arthritis Father   . Colon cancer Father   . Diabetes Sister   . Arthritis Sister   . Diabetes Maternal Grandmother   . Arthritis Maternal Grandmother   . Diabetes Maternal Grandfather   . Arthritis Maternal Grandfather     Past Surgical History:  Procedure Laterality Date  . ABDOMINAL HYSTERECTOMY  1990's  . GANGLION CYST EXCISION Right 1980's   "wrist" (08/08/2012)  . JOINT REPLACEMENT    . KNEE LIGAMENT RECONSTRUCTION Right 1980's  . TONSILLECTOMY  ~ 1954  . TOTAL HIP ARTHROPLASTY Left 2012-08-08  . TOTAL HIP ARTHROPLASTY Left 08-08-2012   Procedure: TOTAL HIP  ARTHROPLASTY;  Surgeon: Garald Balding, MD;  Location: [place];  Service: Orthopedics;  Laterality: Left;  Left Total Hip Arthroplasty  . TOTAL HIP ARTHROPLASTY Left 08/28/2012   Procedure: Irrigation and Debridement hip ;  Surgeon: Mcarthur Rossetti, MD;  Location: [facility];  Service: Orthopedics;  Laterality: Left;  . TOTAL KNEE ARTHROPLASTY Left 2001  . TOTAL KNEE ARTHROPLASTY Right 2004   Social History   Occupational History  . Occupation: Retired  Tobacco Use  . Smoking status: Former Smoker    Packs/day: 0.12    Years: 2.00    Pack years: 0.24    Types: Cigarettes  . Smokeless tobacco: Never Used  . Tobacco comment: 08/03/2012 "quit 30 years ago"  Substance and Sexual Activity  . Alcohol use: No    Alcohol/week: 0.0 standard drinks    Comment: 08/03/2012 "used to be a beeralcoholic"; stopped ~ 30 yr ago"  . Drug use: No  . Sexual activity: Never

## 2019-07-18 NOTE — Progress Notes (Signed)
PATIENT: Sandra Brennan DOB: 07-May-1945  REASON FOR VISIT: follow up HISTORY FROM: patient  HISTORY OF PRESENT ILLNESS: Today 07/19/19  Sandra Brennan 74 year old female with history of chronic migraine headaches and OSA on CPAP.  When last seen she was started on Topamax.  She reports ongoing headaches approximately 2-3 times monthly typically located left temporal and frontal with pressure type sensation.  At times, incontinent of phonophobia and blurred vision but no other associated features.  She self discontinued Topamax due to increased fatigue and "feeling out of it".  At times, headache will subside with use of Tylenol but other times headaches can be debilitating without benefit of Tylenol.  She does report worsening headaches with increased stressors but at times can occur without aggravating factors.  She endorses ongoing compliance with CPAP for OSA management with compliance report from 06/18/2019 -[id number] or 30 usage days with 29 days greater than 4 hours for 97% compliance with residual AHI 1.2.  Set pressure at 6 cm H2O with EPR level 1.  Leaks in the 95th percentile 26.5.  She does report frequently feeling leaks coming from her tubing and recently replaced tubing with only short-term benefit.  Also complains of continued dry mouth and did not increase humidity as previously recommended at prior visit.     HISTORY  12/09/2018 SS: Sandra Brennan is a 74 year old female with history of chronic migraine headaches, and obstructive sleep apnea on CPAP.  She presents today for follow-up.  After last visit, she was taken off nortriptyline due to concern for interaction while taking tramadol for chronic pain and nortriptyline.  She is not on any headache prevention medications.  She says on average she may have 2-3 migraines a month.  She describes a daily mild headache.  She will use Tylenol with good benefit, or special headache packs provided by her insurance company.  She has continued to  gain weight as result of stress and poor eating habits.  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  Her CPAP download indicates excellent compliance, 93% use over 30 days.  She uses her machine greater than 4 hours every night.  Her AHI was optimal at 1.7, she had a mild leak of 22.6 at the 95th percentile. Her set pressure is 6 cmH2O. She does report feeling that the machine dries her mouth out.  She denies noticing a significant leak in the mask.  She uses [facility] for her equipment.  She uses a cane for ambulation when she goes out.  Her blood pressure is elevated today, but she has not taken her medications.  She presents today for follow-up unaccompanied.  REVIEW OF SYSTEMS: Out of a complete 14 system review of symptoms, the patient complains only of the following symptoms, and all other reviewed systems are negative. Headache Dry mouth  ALLERGIES: Allergies  Allergen Reactions  . Cymbalta [Duloxetine Hcl] Anaphylaxis  . Pineapple Shortness Of Breath  . Influenza Vaccines Hives and Itching  . Penicillins Hives  . Sulfa Antibiotics Other (See Comments)    REACTION: unknown  . Theophyllines Hives    HOME MEDICATIONS: Outpatient Medications Prior to Visit  Medication Sig Dispense Refill  . albuterol (VENTOLIN HFA) 108 (90 Base) MCG/ACT inhaler INHALE 1-2 PUFFS EVERY 6 (SIX) HOURS AS NEEDED FOR WHEEZING OR SHORTNESS OF BREATH. NEEDS 6.7 g 0  . allopurinol (ZYLOPRIM) 100 MG tablet Take 1 tablet (100 mg total) by mouth daily. 90 tablet 3  . aspirin EC 81 MG  tablet Take 81 mg by mouth daily.    Marland Kitchen atorvastatin (LIPITOR) 10 MG tablet Take 1 tablet (10 mg total) by mouth daily at 6 PM. 90 tablet 3  . Carboxymethylcellulose Sodium (EYE DROPS OP) Apply to eye. Lubricant eye drops    . celecoxib (CELEBREX) 200 MG capsule TAKE 1 CAPSULE BY MOUTH TWICE A DAY 60 capsule 0  . cetirizine (ZYRTEC) 10 MG tablet Take 10 mg by mouth daily.    . cholecalciferol (VITAMIN D) 1000 units tablet Take  1 tablet (1,000 Units total) by mouth daily. (Patient taking differently: Take 1,000 Units by mouth 2 (two) times daily. )    . cycloSPORINE (RESTASIS) 0.05 % ophthalmic emulsion Restasis 0.05 % eye drops in a dropperette    . lisinopril-hydrochlorothiazide (ZESTORETIC) 10-12.5 MG tablet Take 1 tablet by mouth daily. 90 tablet 1  . Multiple Vitamin (MULTIVITAMIN) tablet Take 1 tablet by mouth daily.    Marland Kitchen nystatin (MYCOSTATIN/NYSTOP) powder Apply 1 application topically 2 (two) times daily. 45 g 1  . omeprazole (PRILOSEC) 20 MG capsule Take 1 capsule (20 mg total) by mouth 2 (two) times daily before a meal. 28 capsule 0  . OVER THE COUNTER MEDICATION Cystic magnesium    . pregabalin (LYRICA) 100 MG capsule Take 1 capsule (100 mg total) by mouth 2 (two) times daily. 60 capsule 5  . Sennosides 8.6 MG CAPS Take by mouth.    . SYMBICORT 160-4.5 MCG/ACT inhaler Inhale 2 puffs into the lungs in the morning and at bedtime. Rinse mouth after each use 1 Inhaler 5  . traMADol (ULTRAM) 50 MG tablet Take 1 tablet (50 mg total) by mouth 2 (two) times daily. 60 tablet 5  . topiramate (TOPAMAX) 25 MG tablet TAKE 1 TABLET AT BEDTIME X 3 DAYS, THEN TAKE 2 TABLETS X 3 DAYS, THEN TAKE 3 TABLETS AT BEDTIME     No facility-administered medications prior to visit.    PAST MEDICAL HISTORY: Past Medical History:  Diagnosis Date  . Anemia   . Arthritis    "plenty" (08-20-2012)  . Asthma   . Chronic lower back pain    "they say I need a whole new spinal column" (20-Aug-2012)  . COPD (chronic obstructive pulmonary disease) ([place])   . Degenerative arthritis    "all over" (08/20/2012)  . Fx ankle   . GERD (gastroesophageal reflux disease)   . Gout 05/05/2013  . Hypertension   . Migraines    "used to; totally stopped when I quit drinking 30 yr ago" (2012-08-20)  . Myalgia and myositis   . Myocardial infarction [facility]) 1992   20-Aug-2012 "mild MI when son died "  . Obesity   . Osteoporosis    "all over" (20-Aug-2012)  .  Shortness of breath    when stressed.  . Sleep apnea    "never did fix my machine; haven't used one for 5 years; I've lost 116# since then; no problems now" (Aug 20, 2012)  . Type II diabetes mellitus ([place])    "borderline; don't test; take Metformin" (20-Aug-2012)    PAST SURGICAL HISTORY: Past Surgical History:  Procedure Laterality Date  . ABDOMINAL HYSTERECTOMY  1990's  . GANGLION CYST EXCISION Right 1980's   "wrist" (2012-08-20)  . JOINT REPLACEMENT    . KNEE LIGAMENT RECONSTRUCTION Right 1980's  . TONSILLECTOMY  ~ 1954  . TOTAL HIP ARTHROPLASTY Left 08/20/12  . TOTAL HIP ARTHROPLASTY Left 08/20/2012   Procedure: TOTAL HIP ARTHROPLASTY;  Surgeon: Garald Balding, MD;  Location: [facility]  OR;  Service: Orthopedics;  Laterality: Left;  Left Total Hip Arthroplasty  . TOTAL HIP ARTHROPLASTY Left 08/28/2012   Procedure: Irrigation and Debridement hip ;  Surgeon: Mcarthur Rossetti, MD;  Location: [place];  Service: Orthopedics;  Laterality: Left;  . TOTAL KNEE ARTHROPLASTY Left 2001  . TOTAL KNEE ARTHROPLASTY Right 2004    FAMILY HISTORY: Family History  Problem Relation Age of Onset  . Arthritis Mother   . Arthritis Father   . Colon cancer Father   . Diabetes Sister   . Arthritis Sister   . Diabetes Maternal Grandmother   . Arthritis Maternal Grandmother   . Diabetes Maternal Grandfather   . Arthritis Maternal Grandfather     SOCIAL HISTORY: Social History   Socioeconomic History  . Marital status: Widowed    Spouse name: Not on file  . Number of children: 5  . Years of education: PhD  . Highest education level: Not on file  Occupational History  . Occupation: Retired  Tobacco Use  . Smoking status: Former Smoker    Packs/day: 0.12    Years: 2.00    Pack years: 0.24    Types: Cigarettes  . Smokeless tobacco: Never Used  . Tobacco comment: 08/03/2012 "quit 30 years ago"  Substance and Sexual Activity  . Alcohol use: No    Alcohol/week: 0.0 standard drinks    Comment:  08/03/2012 "used to be a beeralcoholic"; stopped ~ 30 yr ago"  . Drug use: No  . Sexual activity: Never  Other Topics Concern  . Not on file  Social History Narrative   Lives at home with her mother and caregiver.   Occasional use of caffeine.   Right-handed.   Social Determinants of Health   Financial Resource Strain: Low Risk   . Difficulty of Paying Living Expenses: Not hard at all  Food Insecurity: No Food Insecurity  . Worried About Charity fundraiser in the Last Year: Never true  . Ran Out of Food in the Last Year: Never true  Transportation Needs: No Transportation Needs  . Lack of Transportation (Medical): No  . Lack of Transportation (Non-Medical): No  Physical Activity:   . Days of Exercise per Week:   . Minutes of Exercise per Session:   Stress:   . Feeling of Stress :   Social Connections:   . Frequency of Communication with Friends and Family:   . Frequency of Social Gatherings with Friends and Family:   . Attends Religious Services:   . Active Member of Clubs or Organizations:   . Attends Archivist Meetings:   Marland Kitchen Marital Status:   Intimate Partner Violence:   . Fear of Current or Ex-Partner:   . Emotionally Abused:   Marland Kitchen Physically Abused:   . Sexually Abused:       PHYSICAL EXAM  Vitals:   07/19/19 0731  BP: (!) 162/70  Pulse: 75  Temp: (!) 97 F (36.1 C)  Weight: 281 lb (127.5 kg)  Height: 5\' 1"  (1.549 m)   Body mass index is 53.09 kg/m.  Generalized: Well developed, pleasant elderly African-American female, in no acute distress   Neurological examination  Mentation: Alert oriented to time, place, history taking. Follows all commands speech and language fluent Cranial nerve II-XII: Pupils were equal round reactive to light. Extraocular movements were full, visual field were full on confrontational test. Facial sensation and strength were normal. Uvula tongue midline. Head turning and shoulder shrug  were normal and symmetric. Motor:  The motor testing reveals 5 over 5 strength of all 4 extremities. Good symmetric motor tone is noted throughout.  Sensory: Sensory testing is intact to soft touch on all 4 extremities. No evidence of extinction is noted.  Coordination: Cerebellar testing reveals good finger-nose-finger and heel-to-shin bilaterally.  Gait and station: Gait is normal. Tandem gait is normal. Romberg is negative. No drift is seen.  Reflexes: Deep tendon reflexes are symmetric and normal bilaterally.       ASSESSMENT AND PLAN 74 y.o. year old female  has a past medical history of Anemia, Arthritis, Asthma, Chronic lower back pain, COPD (chronic obstructive pulmonary disease) ([place]), Degenerative arthritis, Fx ankle, GERD (gastroesophageal reflux disease), Gout (05/05/2013), Hypertension, Migraines, Myalgia and myositis, Myocardial infarction ([place]) (1992), Obesity, Osteoporosis, Shortness of breath, Sleep apnea, and Type II diabetes mellitus ([place]). here with follow-up regarding chronic headaches and sleep apnea on CPAP   1. Migraine without aura and without status migrainosus, not intractable -Self discontinued Topamax due to side effects.  Previously on nortriptyline but discontinued due to possible interaction with tramadol -We will hold off on initiating preventative medication due to currently being on multiple pain medications managed by pain management that interact with majority of preventative treatments.   -Recommend sampling Ubreverly for emergent relief -will call office once samples available.  Recommend 50 mg tablet at migraine onset and the repeat in 2 hours if needed.  Unable to place on triptans due to underlying cardiovascular and stroke history -Possible underlying tension features and discussion regarding stress reduction techniques and ongoing follow-up with pain management and orthopedics  2. OSA on CPAP - For home use only DME continuous positive airway pressure (CPAP)  -Follow-up with DME company  in regards to ongoing leaks and further evaluation  -Increase humidity due to ongoing dry mouth -patient will discuss with DME company in  regards to where she is able to do this on her CPAP machine   Follow-up in 6 months with [name], NP or call earlier if needed   I spent 20 minutes with the patient. 50% of this time was spent discussing ongoing migraine and review of CPAP compliance report and further discussion and answered all questions to patient satisfaction   Frann Rider, [facility]  [facility] [address] [place], [place] [address]  Phone [phone] Fax [phone] Note: This document was prepared with digital dictation and possible smart phrase technology. Any transcriptional errors that result from this process are unintentional.

## 2019-07-19 ENCOUNTER — Other Ambulatory Visit: Payer: Self-pay

## 2019-07-19 ENCOUNTER — Ambulatory Visit (INDEPENDENT_AMBULATORY_CARE_PROVIDER_SITE_OTHER): Payer: Medicare Other | Admitting: Adult Health

## 2019-07-19 ENCOUNTER — Encounter: Payer: Self-pay | Admitting: Neurology

## 2019-07-19 VITALS — BP 162/70 | HR 75 | Temp 97.0°F | Ht 61.0 in | Wt 281.0 lb

## 2019-07-19 DIAGNOSIS — G4733 Obstructive sleep apnea (adult) (pediatric): Secondary | ICD-10-CM | POA: Diagnosis not present

## 2019-07-19 DIAGNOSIS — Z9989 Dependence on other enabling machines and devices: Secondary | ICD-10-CM

## 2019-07-19 DIAGNOSIS — G43009 Migraine without aura, not intractable, without status migrainosus: Secondary | ICD-10-CM | POA: Diagnosis not present

## 2019-07-19 NOTE — Patient Instructions (Addendum)
Your Plan:  Follow up with your DME company in regards to leaks and tubing connections as this may have to be looked into further as well as increase your humitidty level due to ongoing dry mouth  Recommend trial of Ubrelvy for emergent migraine relief. We will call you when we have samples available. If you dont hear from office but Monday, please call  Will hold off on any additional medications for headaches as these sound to be related to tension headache. Additional information provided below to help reduce tension headaches and please ensure you follow up with your eye doctor  If your headaches worsen or become more severe, please call office for further discussion   Follow up in 6 months with Sandra Roch, NP or call earlier if needed       Thank you for coming to see Sandra Brennan at [facility]. I hope we have been able to provide you high quality care today.  You may receive a patient satisfaction survey over the next few weeks. We would appreciate your feedback and comments so that we may continue to improve ourselves and the health of our patients.     Tension Headache, Adult A tension headache is pain, pressure, or aching in your head. Tension headaches can last from 30 minutes to several days. Follow these instructions at home: Managing pain  Take over-the-counter and prescription medicines only as told by your doctor.  When you have a headache, lie down in a dark, quiet room.  If told, put ice on your head and neck: ? Put ice in a plastic bag. ? Place a towel between your skin and the bag. ? Leave the ice on for 20 minutes, 2-3 times a day.  If told, put heat on the back of your neck. Do this as often as your doctor tells you to. Use the kind of heat that your doctor recommends, such as a moist heat pack or a heating pad. ? Place a towel between your skin and the heat. ? Leave the heat on for 20-30 minutes. ? Remove the heat if your skin turns bright  red. Eating and drinking  Eat meals on a regular schedule.  Watch how much alcohol you drink: ? If you are a woman and are not pregnant, do not drink more than 1 drink a day. ? If you are a man, do not drink more than 2 drinks a day.  Drink enough fluid to keep your pee (urine) pale yellow.  Do not use a lot of caffeine, or stop using caffeine. Lifestyle  Get enough sleep. Get 7-9 hours of sleep each night. Or get the amount of sleep that your doctor tells you to.  At bedtime, remove all electronic devices from your room. Examples of electronic devices are computers, phones, and tablets.  Find ways to lessen your stress. Some things that can lessen stress are: ? Exercise. ? Deep breathing. ? Yoga. ? Music. ? Positive thoughts.  Sit up straight. Do not tighten (tense) your muscles.  Do not use any products that have nicotine or tobacco in them, such as cigarettes and e-cigarettes. If you need help quitting, ask your doctor. General instructions   Keep all follow-up visits as told by your doctor. This is important.  Avoid things that can bring on headaches. Keep a journal to find out if certain things bring on headaches. For example, write down: ? What you eat and drink. ? How much sleep you get. ? Any change to  your diet or medicines. Contact a doctor if:  Your headache does not get better.  Your headache comes back.  You have a headache and sounds, light, or smells bother you.  You feel sick to your stomach (nauseous) or you throw up (vomit).  Your stomach hurts. Get help right away if:  You suddenly get a very bad headache along with any of these: ? A stiff neck. ? Feeling sick to your stomach. ? Throwing up. ? Feeling weak. ? Trouble seeing. ? Feeling short of breath. ? A rash. ? Feeling unusually sleepy. ? Trouble speaking. ? Pain in your eye or ear. ? Trouble walking or balancing. ? Feeling like you will pass out (faint). ? Passing out. Summary  A  tension headache is pain, pressure, or aching in your head.  Tension headaches can last from 30 minutes to several days.  Lifestyle changes and medicines may help relieve pain. This information is not intended to replace advice given to you by your health care provider. Make sure you discuss any questions you have with your health care provider. Document Revised: 12/15/2018 Document Reviewed: 05/30/2016 Elsevier Patient Education  [place].

## 2019-07-19 NOTE — Progress Notes (Signed)
I have read the note, and I agree with the clinical assessment and plan.  Sandra Brennan   

## 2019-08-02 ENCOUNTER — Other Ambulatory Visit: Payer: Self-pay

## 2019-08-02 ENCOUNTER — Encounter: Payer: Medicare Other | Attending: Physical Medicine & Rehabilitation | Admitting: Physical Medicine & Rehabilitation

## 2019-08-02 ENCOUNTER — Encounter: Payer: Self-pay | Admitting: Physical Medicine & Rehabilitation

## 2019-08-02 VITALS — BP 158/88 | HR 85 | Temp 97.5°F | Ht 60.0 in | Wt 281.0 lb

## 2019-08-02 DIAGNOSIS — M48061 Spinal stenosis, lumbar region without neurogenic claudication: Secondary | ICD-10-CM | POA: Diagnosis not present

## 2019-08-02 MED ORDER — PREGABALIN 150 MG PO CAPS: 150.0000 mg | ORAL_CAPSULE | Freq: Two times a day (BID) | ORAL | 5 refills | Status: DC

## 2019-08-02 MED ORDER — TRAMADOL HCL 50 MG PO TABS: 50.0000 mg | ORAL_TABLET | Freq: Two times a day (BID) | ORAL | 5 refills | Status: DC

## 2019-08-02 NOTE — Patient Instructions (Signed)
Please call if you'd like to try an epidural injection

## 2019-08-02 NOTE — Progress Notes (Signed)
Subjective:    Patient ID: Sandra Brennan, female    DOB: 1946/02/06, 74 y.o.   MRN: [id number]  HPI CC Low back, Left sided shoulder and hip pain   Seen by ortho Dr Durward Fortes to eval Left hip pain, s/p fall, no fx Hx L THA   Xrays showed severe Right hip DJD Also lumbar spine DDD and spondylosis multilevel  Last MRI reviewed showed L4-5 stenosis on left  Pain worse with walking, affects back pain and both feet Has had foot xrays 2018 demonstrating talar collapse on Right  Currently taking tramadol 50mg  BID and Lyrica 100mg  BID.  No drowsiness or cognitive issues Pain Inventory Average Pain 9 Pain Right Now 9 My pain is sharp, stabbing and aching  In the last 24 hours, has pain interfered with the following? General activity 7 Relation with others 0 Enjoyment of life 7 What TIME of day is your pain at its worst? morning, night Sleep (in general) NA  Pain is worse with: walking, sitting and standing Pain improves with: medication Relief from Meds: 3  Mobility walk with assistance use a cane ability to climb steps?  yes  Function retired  Neuro/Psych bowel control problems trouble walking  Prior Studies Any changes since last visit?  no  Physicians involved in your care Any changes since last visit?  no   Family History  Problem Relation Age of Onset  . Arthritis Mother   . Arthritis Father   . Colon cancer Father   . Diabetes Sister   . Arthritis Sister   . Diabetes Maternal Grandmother   . Arthritis Maternal Grandmother   . Diabetes Maternal Grandfather   . Arthritis Maternal Grandfather    Social History   Socioeconomic History  . Marital status: Widowed    Spouse name: Not on file  . Number of children: 5  . Years of education: PhD  . Highest education level: Not on file  Occupational History  . Occupation: Retired  Tobacco Use  . Smoking status: Former Smoker    Packs/day: 0.12    Years: 2.00    Pack years: 0.24    Types: Cigarettes    . Smokeless tobacco: Never Used  . Tobacco comment: 08/03/2012 "quit 30 years ago"  Substance and Sexual Activity  . Alcohol use: No    Alcohol/week: 0.0 standard drinks    Comment: 08/03/2012 "used to be a beeralcoholic"; stopped ~ 30 yr ago"  . Drug use: No  . Sexual activity: Never  Other Topics Concern  . Not on file  Social History Narrative   Lives at home with her mother and caregiver.   Occasional use of caffeine.   Right-handed.   Social Determinants of Health   Financial Resource Strain: Low Risk   . Difficulty of Paying Living Expenses: Not hard at all  Food Insecurity: No Food Insecurity  . Worried About Charity fundraiser in the Last Year: Never true  . Ran Out of Food in the Last Year: Never true  Transportation Needs: No Transportation Needs  . Lack of Transportation (Medical): No  . Lack of Transportation (Non-Medical): No  Physical Activity:   . Days of Exercise per Week:   . Minutes of Exercise per Session:   Stress:   . Feeling of Stress :   Social Connections:   . Frequency of Communication with Friends and Family:   . Frequency of Social Gatherings with Friends and Family:   . Attends Religious Services:   .  Active Member of Clubs or Organizations:   . Attends Archivist Meetings:   Marland Kitchen Marital Status:    Past Surgical History:  Procedure Laterality Date  . ABDOMINAL HYSTERECTOMY  1990's  . GANGLION CYST EXCISION Right 1980's   "wrist" (27-Aug-2012)  . JOINT REPLACEMENT    . KNEE LIGAMENT RECONSTRUCTION Right 1980's  . TONSILLECTOMY  ~ 1954  . TOTAL HIP ARTHROPLASTY Left 08-27-12  . TOTAL HIP ARTHROPLASTY Left August 27, 2012   Procedure: TOTAL HIP ARTHROPLASTY;  Surgeon: Garald Balding, MD;  Location: [facility];  Service: Orthopedics;  Laterality: Left;  Left Total Hip Arthroplasty  . TOTAL HIP ARTHROPLASTY Left 08/28/2012   Procedure: Irrigation and Debridement hip ;  Surgeon: Mcarthur Rossetti, MD;  Location: [place] [facility];  Service: Orthopedics;   Laterality: Left;  . TOTAL KNEE ARTHROPLASTY Left 2001  . TOTAL KNEE ARTHROPLASTY Right 2004   Past Medical History:  Diagnosis Date  . Anemia   . Arthritis    "plenty" (2012-08-27)  . Asthma   . Chronic lower back pain    "they say I need a whole new spinal column" (27-Aug-2012)  . COPD (chronic obstructive pulmonary disease) ([place])   . Degenerative arthritis    "all over" (Aug 27, 2012)  . Fx ankle   . GERD (gastroesophageal reflux disease)   . Gout 05/05/2013  . Hypertension   . Migraines    "used to; totally stopped when I quit drinking 30 yr ago" (08/27/2012)  . Myalgia and myositis   . Myocardial infarction [facility]) 1992   08-27-12 "mild MI when son died "  . Obesity   . Osteoporosis    "all over" (Aug 27, 2012)  . Shortness of breath    when stressed.  . Sleep apnea    "never did fix my machine; haven't used one for 5 years; I've lost 116# since then; no problems now" (08-27-2012)  . Type II diabetes mellitus ([place])    "borderline; don't test; take Metformin" (08-27-12)   BP (!) 158/88   Pulse 85   Temp (!) 97.5 F (36.4 C)   Ht 5' (1.524 m)   Wt 281 lb (127.5 kg)   SpO2 98%   BMI 54.88 kg/m   Opioid Risk Score:   Fall Risk Score:  `1  Depression screen PHQ 2/9  Depression screen [facility] 2/9 05/04/2019 07/12/2018 04/09/2018 04/09/2018 04/01/2017 02/26/2017 02/02/2017  Decreased Interest 0 0 2 0 0 0 0  Down, Depressed, Hopeless 0 1 1 0 0 0 0  PHQ - 2 Score 0 1 3 0 0 0 0  Altered sleeping - 3 3 - - - -  Tired, decreased energy - 0 2 - - - -  Change in appetite - 3 0 - - - -  Feeling bad or failure about yourself  - 1 0 - - - -  Trouble concentrating - 0 2 - - - -  Moving slowly or fidgety/restless - 0 0 - - - -  Suicidal thoughts - 0 0 - - - -  PHQ-9 Score - 8 10 - - - -  Difficult doing work/chores - - - - - - -    Review of Systems  Constitutional: Positive for appetite change.  HENT: Negative.   Eyes: Negative.   Respiratory: Positive for apnea and wheezing.   Cardiovascular:  Positive for leg swelling.  Gastrointestinal: Positive for constipation.  Endocrine: Negative.   Genitourinary: Negative.   Musculoskeletal: Positive for back pain and neck pain.  Skin: Negative.  Allergic/Immunologic: Negative.   Neurological: Negative.   Hematological: Negative.   Psychiatric/Behavioral: Negative.   All other systems reviewed and are negative.      Objective:   Physical Exam Vitals and nursing note reviewed.  Constitutional:      Appearance: She is obese.  HENT:     Head: Normocephalic and atraumatic.  Eyes:     Extraocular Movements: Extraocular movements intact.     Conjunctiva/sclera: Conjunctivae normal.     Pupils: Pupils are equal, round, and reactive to light.  Musculoskeletal:     Comments: Hip range of motion reduced internal extra rotation on the right side but no pain with range of motion Left-sided hip internal extra rotation is approximately 50% of normal but is not painful.  Skin:    General: Skin is warm and dry.  Neurological:     General: No focal deficit present.     Mental Status: She is alert and oriented to person, place, and time.     Comments: Motor strength, 4/5 bilateral hip flexor knee extensor ankle dorsiflexor. Positive straight leg raising on the left side but pain is in the area behind the left knee.   Psychiatric:        Mood and Affect: Mood normal.        Thought Content: Thought content normal.        Judgment: Judgment normal.    Positive drop arm test left shoulder       Assessment & Plan:  #1.  Chronic multifactorial and multifocal pain complaints. Low back and lower extremity pain with walking.  This may represent neurogenic claudication related to worsening lumbar spinal stenosis.  Left lower extremity appears to be the most affected and based on her MRI from 2015, left L4-5 nerve root compression appears to be most prominent. The patient also has osteoarthritis in both feet which can be a source of pain as  well.  2.  Left shoulder pain likely has rotator cuff tear with positive drop arm test.  Do not feel given her age and other comorbidities that she would be a great surgical candidate she will follow orthopedics.  3.  Right hip DJD severe patient will follow up with orthopedics on this, fortunately does not appear to be very painful at least with range of motion 4.  Fibromyalgia syndrome this does not appear to be a major factor in her current pain complaints Will increase tramadol to 100 mg twice daily Lyrica increased to 150 mg twice daily Patient will follow-up with orthopedics regarding her MRI of the lumbar spine.  If she would like to trial an epidural injection this can be performed at this office or with Dr[facility] Ernestina [facility], same office as Dr. Durward Fortes

## 2019-08-12 ENCOUNTER — Ambulatory Visit
Admission: RE | Admit: 2019-08-12 | Discharge: 2019-08-12 | Disposition: A | Discharge status: Home / Self Care | Payer: Medicare Other | Source: Ambulatory Visit | Attending: Orthopedic Surgery | Admitting: Orthopedic Surgery

## 2019-08-12 DIAGNOSIS — M48061 Spinal stenosis, lumbar region without neurogenic claudication: Secondary | ICD-10-CM | POA: Diagnosis not present

## 2019-08-22 ENCOUNTER — Other Ambulatory Visit: Payer: Self-pay

## 2019-08-23 ENCOUNTER — Ambulatory Visit (INDEPENDENT_AMBULATORY_CARE_PROVIDER_SITE_OTHER): Payer: Medicare Other | Admitting: Nurse Practitioner

## 2019-08-23 ENCOUNTER — Encounter: Payer: Self-pay | Admitting: Nurse Practitioner

## 2019-08-23 VITALS — BP 132/84 | HR 70 | Temp 96.1°F | Ht 60.0 in | Wt 276.8 lb

## 2019-08-23 DIAGNOSIS — M2550 Pain in unspecified joint: Secondary | ICD-10-CM

## 2019-08-23 DIAGNOSIS — I10 Essential (primary) hypertension: Secondary | ICD-10-CM

## 2019-08-23 DIAGNOSIS — M1A00X0 Idiopathic chronic gout, unspecified site, without tophus (tophi): Secondary | ICD-10-CM

## 2019-08-23 DIAGNOSIS — K2100 Gastro-esophageal reflux disease with esophagitis, without bleeding: Secondary | ICD-10-CM

## 2019-08-23 DIAGNOSIS — D509 Iron deficiency anemia, unspecified: Secondary | ICD-10-CM | POA: Diagnosis not present

## 2019-08-23 DIAGNOSIS — D649 Anemia, unspecified: Secondary | ICD-10-CM | POA: Insufficient documentation

## 2019-08-23 DIAGNOSIS — G43009 Migraine without aura, not intractable, without status migrainosus: Secondary | ICD-10-CM

## 2019-08-23 DIAGNOSIS — E1142 Type 2 diabetes mellitus with diabetic polyneuropathy: Secondary | ICD-10-CM | POA: Diagnosis not present

## 2019-08-23 DIAGNOSIS — E559 Vitamin D deficiency, unspecified: Secondary | ICD-10-CM | POA: Diagnosis not present

## 2019-08-23 LAB — C-REACTIVE PROTEIN: CRP: 1.7 mg/dL (ref 0.5–20.0)

## 2019-08-23 LAB — BASIC METABOLIC PANEL
BUN: 15 mg/dL (ref 6–23)
CO2: 27 mEq/L (ref 19–32)
Calcium: 9.2 mg/dL (ref 8.4–10.5)
Chloride: 105 mEq/L (ref 96–112)
Creatinine, Ser: 1.27 mg/dL — ABNORMAL HIGH (ref 0.40–1.20)
GFR: 50.02 mL/min — ABNORMAL LOW (ref >60.00)
Glucose, Bld: 90 mg/dL (ref 70–99)
Potassium: 3.8 mEq/L (ref 3.5–5.1)
Sodium: 140 mEq/L (ref 135–145)

## 2019-08-23 LAB — CBC
HCT: 33.6 % — ABNORMAL LOW (ref 36.0–46.0)
Hemoglobin: 11.1 g/dL — ABNORMAL LOW (ref 12.0–15.0)
MCHC: 33 g/dL (ref 30.0–36.0)
MCV: 88.9 fl (ref 78.0–100.0)
Platelets: 196 10*3/uL (ref 150.0–400.0)
RBC: 3.78 Mil/uL — ABNORMAL LOW (ref 3.87–5.11)
RDW: 14 % (ref 11.5–15.5)
WBC: 5.6 10*3/uL (ref 4.0–10.5)

## 2019-08-23 LAB — URIC ACID: Uric Acid, Serum: 7 mg/dL (ref 2.4–7.0)

## 2019-08-23 LAB — HEMOGLOBIN A1C: Hgb A1c MFr Bld: 5.9 % (ref 4.6–6.5)

## 2019-08-23 LAB — SEDIMENTATION RATE: Sed Rate: 66 mm/hr — ABNORMAL HIGH (ref 0–30)

## 2019-08-23 LAB — CK: Total CK: 129 U/L (ref 7–177)

## 2019-08-23 MED ORDER — OMEPRAZOLE 20 MG PO CPDR: 20.0000 mg | DELAYED_RELEASE_CAPSULE | Freq: Every day | ORAL | 1 refills | Status: DC

## 2019-08-23 NOTE — Assessment & Plan Note (Signed)
Normal uric acid Continue allopurinol 100mg  daily

## 2019-08-23 NOTE — Patient Instructions (Addendum)
You were last seen by Dr. Verita Schneiders with [facility] 08/2018. Please call office for another appt. She also instructed to schedule an appt with [facility]: [phone].  Also schedule appt with ophthalmology.  Normal BMP, HgbA1c, and uric acid, CRP and CK and sed rate. CBC indicates persistent anemia, pending iron panel  Use only tylenol for headache. Continue f/up with neurology

## 2019-08-23 NOTE — Assessment & Plan Note (Addendum)
Repeat cbc and iron panel Secondary to CKD vs chronic blood loss? CBC Latest Ref Rng & Units 08/23/2019 03/07/2019 02/28/2019  WBC 4.0 - 10.5 K/uL 5.6 10.0 6.9  Hemoglobin 12.0 - 15.0 g/dL 11.1(L) 10.6(L) 10.6(L)  Hematocrit 36 - 46 % 33.6(L) 35.3(L) 32.6(L)  Platelets 150 - 400 K/uL 196.0 238 211.0

## 2019-08-23 NOTE — Assessment & Plan Note (Signed)
Improved BP. Current use of lisinopril/HCTZ BP Readings from Last 3 Encounters:  08/23/19 132/84  08/02/19 (!) 158/88  07/19/19 (!) 162/70   BMP Latest Ref Rng & Units 08/23/2019 03/07/2019 02/28/2019  Glucose 70 - 99 mg/dL 90 107(H) 86  BUN 6 - 23 mg/dL 15 14 17   Creatinine 0.40 - 1.20 mg/dL 1.27(H) 1.33(H) 1.20  BUN/Creat Ratio 6 - 22 (calc) - - -  Sodium 135 - 145 mEq/L 140 141 137  Potassium 3.5 - 5.1 mEq/L 3.8 4.6 3.9  Chloride 96 - 112 mEq/L 105 105 105  CO2 19 - 32 mEq/L 27 27 24   Calcium 8.4 - 10.5 mg/dL 9.2 8.9 9.3   Continue to monitor renal function F/up in 64months

## 2019-08-23 NOTE — Progress Notes (Addendum)
Subjective:  Patient ID: Sandra Brennan, female    DOB: 1945-11-04  Age: 74 y.o. MRN: [id number]  CC: Follow-up (3 mo DM, HTN, and hyperlipidemia-pt is fasting//pt doesn't check blood sugars//pt states checks bp rarely but reports stable//complaining of headaches-she has one today-due for eye exam)  HPI DM, HTN and Hyperlipidemia: controlled with diet Last HgbA1c of 6.6 Positive neuropathy. Needs diabetic shoes and f/up appt with podiatry. Negative urine microalbumin BP at goal LDL at goal with lipitor Needs to schedule eye exam  BP Readings from Last 3 Encounters:  08/23/19 132/84  08/02/19 (!) 158/88  07/19/19 (!) 162/70   Migraine Headache: Chronic, waxing and waning, unable to tolerate topamax, use of tylenol with minimal relief. Followed by neurology, last appt 07/2019, plan to try uberlvy samples when available. chronic use of lyrica and tramadol for chronic joint pain (managed by Dr. Read Drivers.  GERD: Intermittent heartburn Relief with omeprazole. Needs medication refilled.  Reviewed past Medical, Social and Family history today.  Outpatient Medications Prior to Visit  Medication Sig Dispense Refill  . albuterol (VENTOLIN HFA) 108 (90 Base) MCG/ACT inhaler INHALE 1-2 PUFFS EVERY 6 (SIX) HOURS AS NEEDED FOR WHEEZING OR SHORTNESS OF BREATH. NEEDS 6.7 g 0  . allopurinol (ZYLOPRIM) 100 MG tablet Take 1 tablet (100 mg total) by mouth daily. 90 tablet 3  . aspirin EC 81 MG tablet Take 81 mg by mouth daily.    Marland Kitchen atorvastatin (LIPITOR) 10 MG tablet Take 1 tablet (10 mg total) by mouth daily at 6 PM. 90 tablet 3  . Carboxymethylcellulose Sodium (EYE DROPS OP) Apply to eye. Lubricant eye drops    . cetirizine (ZYRTEC) 10 MG tablet Take 10 mg by mouth daily.    . cholecalciferol (VITAMIN D) 1000 units tablet Take 1 tablet (1,000 Units total) by mouth daily. (Patient taking differently: Take 1,000 Units by mouth 2 (two) times daily. )    . cycloSPORINE (RESTASIS) 0.05 %  ophthalmic emulsion Restasis 0.05 % eye drops in a dropperette    . lisinopril-hydrochlorothiazide (ZESTORETIC) 10-12.5 MG tablet Take 1 tablet by mouth daily. 90 tablet 1  . Multiple Vitamin (MULTIVITAMIN) tablet Take 1 tablet by mouth daily.    Marland Kitchen nystatin (MYCOSTATIN/NYSTOP) powder Apply 1 application topically 2 (two) times daily. 45 g 1  . OVER THE COUNTER MEDICATION Cystic magnesium    . pregabalin (LYRICA) 150 MG capsule Take 1 capsule (150 mg total) by mouth 2 (two) times daily. 60 capsule 5  . Sennosides 8.6 MG CAPS Take by mouth.    . SYMBICORT 160-4.5 MCG/ACT inhaler Inhale 2 puffs into the lungs in the morning and at bedtime. Rinse mouth after each use 1 Inhaler 5  . traMADol (ULTRAM) 50 MG tablet Take 1 tablet (50 mg total) by mouth 2 (two) times daily. 60 tablet 5  . celecoxib (CELEBREX) 200 MG capsule TAKE 1 CAPSULE BY MOUTH TWICE A DAY 60 capsule 0  . omeprazole (PRILOSEC) 20 MG capsule Take 1 capsule (20 mg total) by mouth 2 (two) times daily before a meal. 28 capsule 0   No facility-administered medications prior to visit.    ROS See HPI  Objective:  BP 132/84   Pulse 70   Temp (!) 96.1 F (35.6 C) (Tympanic)   Ht 5' (1.524 m)   Wt 276 lb 12.8 oz (125.6 kg)   SpO2 97%   BMI 54.06 kg/m   BP Readings from Last 3 Encounters:  08/23/19 132/84  08/02/19 (!) 158/88  07/19/19 (!) 162/70   Wt Readings from Last 3 Encounters:  08/23/19 276 lb 12.8 oz (125.6 kg)  08/02/19 281 lb (127.5 kg)  07/19/19 281 lb (127.5 kg)   Physical Exam Constitutional:      Appearance: She is obese.  Cardiovascular:     Rate and Rhythm: Normal rate and regular rhythm.     Pulses: Normal pulses.     Heart sounds: Normal heart sounds.  Pulmonary:     Effort: Pulmonary effort is normal.     Breath sounds: Normal breath sounds.  Musculoskeletal:     Right lower leg: Edema present.     Left lower leg: Edema present.  Skin:    General: Skin is warm and dry.     Comments: Abnormal  diabetic foot exam  Neurological:     Mental Status: She is alert and oriented to person, place, and time.  Psychiatric:        Mood and Affect: Mood normal.        Behavior: Behavior normal.        Thought Content: Thought content normal.    Lab Results  Component Value Date   WBC 5.6 08/23/2019   HGB 11.1 (L) 08/23/2019   HCT 33.6 (L) 08/23/2019   PLT 196.0 08/23/2019   GLUCOSE 90 08/23/2019   CHOL 129 02/28/2019   TRIG 57.0 02/28/2019   HDL 59.80 02/28/2019   LDLCALC 58 02/28/2019   ALT 17 03/07/2019   AST 28 03/07/2019   NA 140 08/23/2019   K 3.8 08/23/2019   CL 105 08/23/2019   CREATININE 1.27 (H) 08/23/2019   BUN 15 08/23/2019   CO2 27 08/23/2019   TSH 2.80 02/26/2017   INR 1.17 08/27/2012   HGBA1C 5.9 08/23/2019   MICROALBUR <0.7 08/31/2017    Assessment & Plan:  This visit occurred during the SARS-CoV-2 public health emergency.  Safety protocols were in place, including screening questions prior to the visit, additional usage of staff PPE, and extensive cleaning of exam room while observing appropriate contact time as indicated for disinfecting solutions.   Kailene was seen today for follow-up.  Diagnoses and all orders for this visit:  Essential hypertension -     Basic metabolic panel  Type 2 diabetes mellitus with diabetic polyneuropathy, without long-term current use of insulin (HCC) -     Hemoglobin A1c  Chronic gouty arthritis -     Uric acid  Vitamin D deficiency -     Vitamin D 1,25 dihydroxy  Iron deficiency anemia, unspecified iron deficiency anemia type -     CBC -     Iron, TIBC and Ferritin Panel -     Fecal occult blood, imunochemical; Standing  Arthralgia, unspecified joint -     C-reactive protein -     CK (Creatine Kinase) -     Sedimentation rate  Migraine without aura and without status migrainosus, not intractable  Gastroesophageal reflux disease with esophagitis without hemorrhage -     omeprazole (PRILOSEC) 20 MG capsule;  Take 1 capsule (20 mg total) by mouth daily.   I have discontinued [name]. Demchak's celecoxib. I have also changed her omeprazole. Additionally, I am having her maintain her aspirin EC, Sennosides, cycloSPORINE, cholecalciferol, Carboxymethylcellulose Sodium (EYE DROPS OP), cetirizine, OVER THE COUNTER MEDICATION, multivitamin, nystatin, allopurinol, atorvastatin, albuterol, Symbicort, lisinopril-hydrochlorothiazide, pregabalin, and traMADol.  Meds ordered this encounter  Medications  . omeprazole (PRILOSEC) 20 MG capsule    Sig: Take 1 capsule (20 mg total)  by mouth daily.    Dispense:  90 capsule    Refill:  1    Order Specific Question:   Supervising Provider    Answer:   Ronnald Nian [[id number]]    Problem List Items Addressed This Visit      Cardiovascular and Mediastinum   Hypertension - Primary (Chronic)    Improved BP. Current use of lisinopril/HCTZ BP Readings from Last 3 Encounters:  08/23/19 132/84  08/02/19 (!) 158/88  07/19/19 (!) 162/70   BMP Latest Ref Rng & Units 08/23/2019 03/07/2019 02/28/2019  Glucose 70 - 99 mg/dL 90 107(H) 86  BUN 6 - 23 mg/dL 15 14 17   Creatinine 0.40 - 1.20 mg/dL 1.27(H) 1.33(H) 1.20  BUN/Creat Ratio 6 - 22 (calc) - - -  Sodium 135 - 145 mEq/L 140 141 137  Potassium 3.5 - 5.1 mEq/L 3.8 4.6 3.9  Chloride 96 - 112 mEq/L 105 105 105  CO2 19 - 32 mEq/L 27 27 24   Calcium 8.4 - 10.5 mg/dL 9.2 8.9 9.3   Continue to monitor renal function F/up in 48months      Relevant Orders   Basic metabolic panel (Completed)   Migraine without aura and without status migrainosus, not intractable    Chronic, waxing and waning, unable to tolerate topamax, use of tylenol with minimal relief. Followed by neurology, last appt 07/2019, plan to try uberlvy samples when available. chronic use of lyrica and tramadol for chronic joint pain (managed by Dr. Read Drivers. Normal CRP and sed rate Continue f/up with neurology        Digestive   GERD with  esophagitis   Relevant Medications   omeprazole (PRILOSEC) 20 MG capsule     Endocrine   Type 2 diabetes mellitus with diabetic polyneuropathy, without long-term current use of insulin (HCC) (Chronic)   Relevant Orders   Hemoglobin A1c (Completed)     Musculoskeletal and Integument   Chronic gouty arthritis    Normal uric acid Continue allopurinol 100mg  daily      Relevant Orders   Uric acid (Completed)     Other   Anemia    Repeat cbc and iron panel Secondary to CKD vs chronic blood loss? CBC Latest Ref Rng & Units 08/23/2019 03/07/2019 02/28/2019  WBC 4.0 - 10.5 K/uL 5.6 10.0 6.9  Hemoglobin 12.0 - 15.0 g/dL 11.1(L) 10.6(L) 10.6(L)  Hematocrit 36 - 46 % 33.6(L) 35.3(L) 32.6(L)  Platelets 150 - 400 K/uL 196.0 238 211.0        Relevant Orders   CBC (Completed)   Iron, TIBC and Ferritin Panel (Completed)   Fecal occult blood, imunochemical   Vitamin D deficiency   Relevant Orders   Vitamin D 1,25 dihydroxy (Completed)    Other Visit Diagnoses    Arthralgia, unspecified joint       Relevant Orders   C-reactive protein (Completed)   CK (Creatine Kinase) (Completed)   Sedimentation rate (Completed)      Follow-up: Return in about 6 months (around 02/22/2020) for HTN and , hyperlipidemia.  Wilfred Lacy, NP

## 2019-08-23 NOTE — Assessment & Plan Note (Addendum)
Chronic, waxing and waning, unable to tolerate topamax, use of tylenol with minimal relief. Followed by neurology, last appt 07/2019, plan to try uberlvy samples when available. chronic use of lyrica and tramadol for chronic joint pain (managed by Dr. Read Drivers. Normal CRP and sed rate Continue f/up with neurology

## 2019-08-26 LAB — VITAMIN D 1,25 DIHYDROXY
Vitamin D 1, 25 (OH)2 Total: 22 pg/mL (ref 18–72)
Vitamin D2 1, 25 (OH)2: <8 pg/mL
Vitamin D3 1, 25 (OH)2: 22 pg/mL

## 2019-08-26 LAB — IRON,TIBC AND FERRITIN PANEL
%SAT: 13 % (calc) — ABNORMAL LOW (ref 16–45)
Ferritin: 43 ng/mL (ref 16–288)
Iron: 47 ug/dL (ref 45–160)
TIBC: 360 mcg/dL (calc) (ref 250–450)

## 2019-08-26 NOTE — Addendum Note (Signed)
Addended by: Wilfred Lacy L on: 08/26/2019 11:01 AM   Modules accepted: Orders

## 2019-09-07 ENCOUNTER — Other Ambulatory Visit (INDEPENDENT_AMBULATORY_CARE_PROVIDER_SITE_OTHER): Payer: Medicare Other

## 2019-09-07 DIAGNOSIS — D509 Iron deficiency anemia, unspecified: Secondary | ICD-10-CM | POA: Diagnosis not present

## 2019-09-07 DIAGNOSIS — G4733 Obstructive sleep apnea (adult) (pediatric): Secondary | ICD-10-CM | POA: Diagnosis not present

## 2019-09-09 LAB — FECAL OCCULT BLOOD, IMMUNOCHEMICAL: Fecal Occult Bld: NEGATIVE

## 2019-10-06 ENCOUNTER — Other Ambulatory Visit: Payer: Self-pay | Admitting: Nurse Practitioner

## 2019-10-06 DIAGNOSIS — J4521 Mild intermittent asthma with (acute) exacerbation: Secondary | ICD-10-CM

## 2019-10-10 DIAGNOSIS — H5213 Myopia, bilateral: Secondary | ICD-10-CM | POA: Diagnosis not present

## 2019-10-17 ENCOUNTER — Other Ambulatory Visit: Payer: Self-pay | Admitting: Nurse Practitioner

## 2019-10-17 DIAGNOSIS — J4520 Mild intermittent asthma, uncomplicated: Secondary | ICD-10-CM

## 2019-11-06 ENCOUNTER — Other Ambulatory Visit: Payer: Self-pay | Admitting: Nurse Practitioner

## 2019-11-06 DIAGNOSIS — J4520 Mild intermittent asthma, uncomplicated: Secondary | ICD-10-CM

## 2019-11-16 ENCOUNTER — Other Ambulatory Visit: Payer: Self-pay | Admitting: Neurology

## 2019-11-28 ENCOUNTER — Telehealth: Payer: Self-pay | Admitting: Nurse Practitioner

## 2019-11-28 NOTE — Telephone Encounter (Signed)
Patient is returning the call, please advise. CB is [phone].

## 2019-11-30 NOTE — Telephone Encounter (Signed)
Unable to reach pt after calling x2.

## 2019-12-07 DIAGNOSIS — G4733 Obstructive sleep apnea (adult) (pediatric): Secondary | ICD-10-CM | POA: Diagnosis not present

## 2019-12-13 DIAGNOSIS — J45909 Unspecified asthma, uncomplicated: Secondary | ICD-10-CM | POA: Diagnosis not present

## 2019-12-13 DIAGNOSIS — M109 Gout, unspecified: Secondary | ICD-10-CM | POA: Diagnosis not present

## 2019-12-13 DIAGNOSIS — E785 Hyperlipidemia, unspecified: Secondary | ICD-10-CM | POA: Diagnosis not present

## 2019-12-13 DIAGNOSIS — K219 Gastro-esophageal reflux disease without esophagitis: Secondary | ICD-10-CM | POA: Diagnosis not present

## 2019-12-13 DIAGNOSIS — E119 Type 2 diabetes mellitus without complications: Secondary | ICD-10-CM | POA: Diagnosis not present

## 2020-01-19 ENCOUNTER — Ambulatory Visit: Payer: Medicare Other | Admitting: Neurology

## 2020-01-23 ENCOUNTER — Other Ambulatory Visit: Payer: Self-pay

## 2020-01-23 ENCOUNTER — Encounter: Payer: Self-pay | Admitting: Neurology

## 2020-01-23 ENCOUNTER — Ambulatory Visit (INDEPENDENT_AMBULATORY_CARE_PROVIDER_SITE_OTHER): Payer: Medicare Other | Admitting: Neurology

## 2020-01-23 ENCOUNTER — Other Ambulatory Visit: Payer: Self-pay | Admitting: Neurology

## 2020-01-23 VITALS — BP 128/74 | HR 76 | Ht 60.0 in | Wt 287.0 lb

## 2020-01-23 DIAGNOSIS — G4730 Sleep apnea, unspecified: Secondary | ICD-10-CM | POA: Diagnosis not present

## 2020-01-23 DIAGNOSIS — G43009 Migraine without aura, not intractable, without status migrainosus: Secondary | ICD-10-CM

## 2020-01-23 MED ORDER — UBRELVY 50 MG PO TABS: 50.0000 mg | ORAL_TABLET | ORAL | 11 refills | Status: DC | PRN

## 2020-01-23 NOTE — Patient Instructions (Addendum)
Let's try Roselyn Meier for acute headache, take 1 tablet at onset of headache, may repeat once 2 hours after initial dosing If leaks do not improve, please let your DME company know Keep your appointment with your primary doctor, try to work on weight loss See you back in 6 months  Ubrogepant tablets What is this medicine? UBROGEPANT (ue BROE je pant) is used to treat migraine headaches with or without aura. An aura is a strange feeling or visual disturbance that warns you of an attack. It is not used to prevent migraines. This medicine may be used for other purposes; ask your health care provider or pharmacist if you have questions. COMMON BRAND NAME(S): Roselyn Meier What should I tell my health care provider before I take this medicine? They need to know if you have any of these conditions:  kidney disease  liver disease  an unusual or allergic reaction to ubrogepant, other medicines, foods, dyes, or preservatives  pregnant or trying to get pregnant  breast-feeding How should I use this medicine? Take this medicine by mouth with a glass of water. Follow the directions on the prescription label. You can take it with or without food. If it upsets your stomach, take it with food. Take your medicine at regular intervals. Do not take it more often than directed. Do not stop taking except on your doctor's advice. Talk to your pediatrician about the use of this medicine in children. Special care may be needed. Overdosage: If you think you have taken too much of this medicine contact a poison control center or emergency room at once. NOTE: This medicine is only for you. Do not share this medicine with others. What if I miss a dose? This does not apply. This medicine is not for regular use. What may interact with this medicine? Do not take this medicine with any of the following medicines:  ceritinib  certain antibiotics like chloramphenicol, clarithromycin, telithromycin  certain antivirals for  HIV like atazanavir, cobicistat, darunavir, delavirdine, fosamprenavir, indinavir, ritonavir  certain medicines for fungal infections like itraconazole, ketoconazole, posaconazole, voriconazole  conivaptan  grapefruit  idelalisib  mifepristone  nefazodone  ribociclib This medicine may also interact with the following medications:  carvedilol  certain medicines for seizures like phenobarbital, phenytoin  ciprofloxacin  cyclosporine  eltrombopag  fluconazole  fluvoxamine  quinidine  rifampin  St. John's wort  verapamil This list may not describe all possible interactions. Give your health care provider a list of all the medicines, herbs, non-prescription drugs, or dietary supplements you use. Also tell them if you smoke, drink alcohol, or use illegal drugs. Some items may interact with your medicine. What should I watch for while using this medicine? Visit your health care professional for regular checks on your progress. Tell your health care professional if your symptoms do not start to get better or if they get worse. Your mouth may get dry. Chewing sugarless gum or sucking hard candy and drinking plenty of water may help. Contact your health care professional if the problem does not go away or is severe. What side effects may I notice from receiving this medicine? Side effects that you should report to your doctor or health care professional as soon as possible:  allergic reactions like skin rash, itching or hives; swelling of the face, lips, or tongue Side effects that usually do not require medical attention (report these to your doctor or health care professional if they continue or are bothersome):  drowsiness  dry mouth  nausea  tiredness This list may not describe all possible side effects. Call your doctor for medical advice about side effects. You may report side effects to FDA at 1-800-FDA-1088. Where should I keep my medicine? Keep out of the reach  of children. Store at room temperature between 15 and 30 degrees C (59 and 86 degrees F). Throw away any unused medicine after the expiration date. NOTE: This sheet is a summary. It may not cover all possible information. If you have questions about this medicine, talk to your doctor, pharmacist, or health care provider.  2020 Elsevier/Gold Standard (2018-05-06 08:50:55)

## 2020-01-23 NOTE — Progress Notes (Signed)
PATIENT: Sandra Brennan DOB: 1945-10-01  REASON FOR VISIT: follow up HISTORY FROM: patient  HISTORY OF PRESENT ILLNESS: Today 01/23/20 Sandra Brennan is a 74 year old female with history of chronic migraine headaches and OSA on CPAP. Could not tolerate Topamax for headaches secondary to fatigue, nortriptyline was discontinued due to possible interaction with tramadol.  Is taking tramadol and Lyrica for chronic pain, fibromyalgia.  Of recent, has been under more stress, more frequent headaches, once a week, but may last 1-3 days, Tylenol is not helpful. When stress is well controlled, headaches do well.  In the last year, gained about 15 pounds, has not been exercising.  Has asthma, more SOB with exertion, using inhaler.  Headaches are on the left temporal area, caring for her mother, has Alzheimer's, takes care of her grandkids.  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  Recent CPAP download indicates good compliance, used 29/30 days, 26/30 days greater than 4 hours (87%), average usage days used 6 hours 40 minutes, set pressure 6 cm water, leak 95th percentile 26.7, AHI 4.1. Uses nasal mask, felt to have leaking in tubing, just got new tubing about 2 weeks ago. Still has dry mouth, but sleeps with mouth open. Here today for follow-up unaccompanied.  HISTORY Some 07/19/2019 JM: Sandra Brennan 74 year old female with history of chronic migraine headaches and OSA on CPAP.  When last seen she was started on Topamax.  She reports ongoing headaches approximately 2-3 times monthly typically located left temporal and frontal with pressure type sensation.  At times, incontinent of phonophobia and blurred vision but no other associated features.  She self discontinued Topamax due to increased fatigue and "feeling out of it".  At times, headache will subside with use of Tylenol but other times headaches can be debilitating without benefit of Tylenol.  She does report worsening headaches with increased  stressors but at times can occur without aggravating factors.  She endorses ongoing compliance with CPAP for OSA management with compliance report from 06/18/2019 -[id number] or 30 usage days with 29 days greater than 4 hours for 97% compliance with residual AHI 1.2.  Set pressure at 6 cm H2O with EPR level 1.  Leaks in the 95th percentile 26.5.  She does report frequently feeling leaks coming from her tubing and recently replaced tubing with only short-term benefit.  Also complains of continued dry mouth and did not increase humidity as previously recommended at prior visit.   REVIEW OF SYSTEMS: Out of a complete 14 system review of symptoms, the patient complains only of the following symptoms, and all other reviewed systems are negative.  Headache, sleep apnea  ALLERGIES: Allergies  Allergen Reactions  . Cymbalta [Duloxetine Hcl] Anaphylaxis  . Pineapple Shortness Of Breath  . Influenza Vaccines Hives and Itching  . Penicillins Hives  . Sulfa Antibiotics Other (See Comments)    REACTION: unknown  . Theophyllines Hives    HOME MEDICATIONS: Outpatient Medications Prior to Visit  Medication Sig Dispense Refill  . albuterol (VENTOLIN HFA) 108 (90 Base) MCG/ACT inhaler Inhale 1-2 puffs into the lungs every 6 (six) hours as needed for wheezing or shortness of breath. Need appointment for additional refills 6.7 g 0  . allopurinol (ZYLOPRIM) 100 MG tablet Take 1 tablet (100 mg total) by mouth daily. 90 tablet 3  . aspirin EC 81 MG tablet Take 81 mg by mouth daily.    Marland Kitchen atorvastatin (LIPITOR) 10 MG tablet Take 1 tablet (10 mg total) by mouth daily at  6 PM. 90 tablet 3  . Carboxymethylcellulose Sodium (EYE DROPS OP) Apply to eye. Lubricant eye drops    . cetirizine (ZYRTEC) 10 MG tablet Take 10 mg by mouth daily.    . cholecalciferol (VITAMIN D) 1000 units tablet Take 1 tablet (1,000 Units total) by mouth daily. (Patient taking differently: Take 1,000 Units by mouth 2 (two) times daily. )    .  cycloSPORINE (RESTASIS) 0.05 % ophthalmic emulsion Restasis 0.05 % eye drops in a dropperette    . lisinopril-hydrochlorothiazide (ZESTORETIC) 10-12.5 MG tablet Take 1 tablet by mouth daily. 90 tablet 1  . Multiple Vitamin (MULTIVITAMIN) tablet Take 1 tablet by mouth daily.    Marland Kitchen nystatin (MYCOSTATIN/NYSTOP) powder Apply 1 application topically 2 (two) times daily. 45 g 1  . omeprazole (PRILOSEC) 20 MG capsule Take 1 capsule (20 mg total) by mouth daily. 90 capsule 1  . OVER THE COUNTER MEDICATION Cystic magnesium    . pregabalin (LYRICA) 150 MG capsule Take 1 capsule (150 mg total) by mouth 2 (two) times daily. 60 capsule 5  . Sennosides 8.6 MG CAPS Take by mouth.    . SYMBICORT 160-4.5 MCG/ACT inhaler INHALE 2 PUFFS INTO THE LUNGS IN THE MORNING AND AT BEDTIME. RINSE MOUTH AFTER EACH USE 30.6 Inhaler 7  . traMADol (ULTRAM) 50 MG tablet Take 1 tablet (50 mg total) by mouth 2 (two) times daily. 60 tablet 5   No facility-administered medications prior to visit.    PAST MEDICAL HISTORY: Past Medical History:  Diagnosis Date  . Anemia   . Arthritis    "plenty" (08-31-12)  . Asthma   . Chronic lower back pain    "they say I need a whole new spinal column" (08-31-12)  . COPD (chronic obstructive pulmonary disease) ([place])   . Degenerative arthritis    "all over" (08/31/2012)  . Fx ankle   . GERD (gastroesophageal reflux disease)   . Gout 05/05/2013  . Hypertension   . Migraines    "used to; totally stopped when I quit drinking 30 yr ago" (31-Aug-2012)  . Myalgia and myositis   . Myocardial infarction [facility]) 1992   08-31-2012 "mild MI when son died "  . Obesity   . Osteoporosis    "all over" (08/31/2012)  . Shortness of breath    when stressed.  . Sleep apnea    "never did fix my machine; haven't used one for 5 years; I've lost 116# since then; no problems now" (August 31, 2012)  . Type II diabetes mellitus ([place])    "borderline; don't test; take Metformin" (2012-08-31)    PAST SURGICAL HISTORY: Past  Surgical History:  Procedure Laterality Date  . ABDOMINAL HYSTERECTOMY  1990's  . GANGLION CYST EXCISION Right 1980's   "wrist" (08/31/12)  . JOINT REPLACEMENT    . KNEE LIGAMENT RECONSTRUCTION Right 1980's  . TONSILLECTOMY  ~ 1954  . TOTAL HIP ARTHROPLASTY Left 08/31/12  . TOTAL HIP ARTHROPLASTY Left 08-31-12   Procedure: TOTAL HIP ARTHROPLASTY;  Surgeon: Garald Balding, MD;  Location: [place];  Service: Orthopedics;  Laterality: Left;  Left Total Hip Arthroplasty  . TOTAL HIP ARTHROPLASTY Left 08/28/2012   Procedure: Irrigation and Debridement hip ;  Surgeon: Mcarthur Rossetti, MD;  Location: [place];  Service: Orthopedics;  Laterality: Left;  . TOTAL KNEE ARTHROPLASTY Left 2001  . TOTAL KNEE ARTHROPLASTY Right 2004    FAMILY HISTORY: Family History  Problem Relation Age of Onset  . Arthritis Mother   . Arthritis Father   .  Colon cancer Father   . Diabetes Sister   . Arthritis Sister   . Diabetes Maternal Grandmother   . Arthritis Maternal Grandmother   . Diabetes Maternal Grandfather   . Arthritis Maternal Grandfather     SOCIAL HISTORY: Social History   Socioeconomic History  . Marital status: Widowed    Spouse name: Not on file  . Number of children: 5  . Years of education: PhD  . Highest education level: Not on file  Occupational History  . Occupation: Retired  Tobacco Use  . Smoking status: Former Smoker    Packs/day: 0.12    Years: 2.00    Pack years: 0.24    Types: Cigarettes  . Smokeless tobacco: Never Used  . Tobacco comment: 08/03/2012 "quit 30 years ago"  Vaping Use  . Vaping Use: Never used  Substance and Sexual Activity  . Alcohol use: No    Alcohol/week: 0.0 standard drinks    Comment: 08/03/2012 "used to be a beeralcoholic"; stopped ~ 30 yr ago"  . Drug use: No  . Sexual activity: Never  Other Topics Concern  . Not on file  Social History Narrative   Lives at home with her mother and caregiver.   Occasional use of caffeine.    Right-handed.   Social Determinants of Health   Financial Resource Strain: Low Risk   . Difficulty of Paying Living Expenses: Not hard at all  Food Insecurity: No Food Insecurity  . Worried About Charity fundraiser in the Last Year: Never true  . Ran Out of Food in the Last Year: Never true  Transportation Needs: No Transportation Needs  . Lack of Transportation (Medical): No  . Lack of Transportation (Non-Medical): No  Physical Activity:   . Days of Exercise per Week: Not on file  . Minutes of Exercise per Session: Not on file  Stress:   . Feeling of Stress : Not on file  Social Connections:   . Frequency of Communication with Friends and Family: Not on file  . Frequency of Social Gatherings with Friends and Family: Not on file  . Attends Religious Services: Not on file  . Active Member of Clubs or Organizations: Not on file  . Attends Archivist Meetings: Not on file  . Marital Status: Not on file  Intimate Partner Violence:   . Fear of Current or Ex-Partner: Not on file  . Emotionally Abused: Not on file  . Physically Abused: Not on file  . Sexually Abused: Not on file   PHYSICAL EXAM  Vitals:   01/23/20 0826  BP: 128/74  Pulse: 76  Weight: 287 lb (130.2 kg)  Height: 5' (1.524 m)   Body mass index is 56.05 kg/m.  Generalized: Well developed, in no acute distress   Neurological examination  Mentation: Alert oriented to time, place, history taking. Follows all commands speech and language fluent Cranial nerve II-XII: Pupils were equal round reactive to light. Extraocular movements were full, visual field were full on confrontational test. Facial sensation and strength were normal. Head turning and shoulder shrug  were normal and symmetric. Motor: Strength is intact in all extremities Sensory: Sensory testing is intact to soft touch on all 4 extremities. No evidence of extinction is noted.  Coordination: Cerebellar testing reveals good finger-nose-finger  and heel-to-shin bilaterally.  Gait and station: Gait is wide-based, antalgic, using cane, is obese.  DIAGNOSTIC DATA (LABS, IMAGING, TESTING) - I reviewed patient records, labs, notes, testing and imaging myself where available.  Lab Results  Component Value Date   WBC 5.6 08/23/2019   HGB 11.1 (L) 08/23/2019   HCT 33.6 (L) 08/23/2019   MCV 88.9 08/23/2019   PLT 196.0 08/23/2019      Component Value Date/Time   NA 140 08/23/2019 0959   NA 142 07/14/2014 1014   K 3.8 08/23/2019 0959   CL 105 08/23/2019 0959   CO2 27 08/23/2019 0959   GLUCOSE 90 08/23/2019 0959   BUN 15 08/23/2019 0959   BUN 12 07/14/2014 1014   CREATININE 1.27 (H) 08/23/2019 0959   CREATININE 1.39 (H) 04/09/2018 1549   CALCIUM 9.2 08/23/2019 0959   PROT 6.9 03/07/2019 2115   PROT 6.9 07/14/2014 1014   ALBUMIN 3.2 (L) 03/07/2019 2115   ALBUMIN 4.3 07/14/2014 1014   AST 28 03/07/2019 2115   ALT 17 03/07/2019 2115   ALKPHOS 76 03/07/2019 2115   BILITOT 0.3 03/07/2019 2115   BILITOT 0.2 07/14/2014 1014   GFRNONAA 40 (L) 03/07/2019 2115   GFRAA 46 (L) 03/07/2019 2115   Lab Results  Component Value Date   CHOL 129 02/28/2019   HDL 59.80 02/28/2019   LDLCALC 58 02/28/2019   TRIG 57.0 02/28/2019   CHOLHDL 2 02/28/2019   Lab Results  Component Value Date   HGBA1C 5.9 08/23/2019   Lab Results  Component Value Date   VITAMINB12 307 07/14/2014   Lab Results  Component Value Date   TSH 2.80 02/26/2017   ASSESSMENT AND PLAN 74 y.o. year old female  has a past medical history of Anemia, Arthritis, Asthma, Chronic lower back pain, COPD (chronic obstructive pulmonary disease) ([place]), Degenerative arthritis, Fx ankle, GERD (gastroesophageal reflux disease), Gout (05/05/2013), Hypertension, Migraines, Myalgia and myositis, Myocardial infarction ([place]) (1992), Obesity, Osteoporosis, Shortness of breath, Sleep apnea, and Type II diabetes mellitus ([place]). here with:  1.  Migraine headache -Could not tolerate  Topamax due to fatigue, nortriptyline d/c due to potential interaction with tramadol (on daily Lyrica and tramadol for chronic pain) -Increase in headaches related to stress, can be prolonged -Try Ubrelvy 50 mg tablet for acute headache to break cycle, is not a candidate for triptans due to age and vascular risk factors -Would not offer beta-blocker as preventative due to problematic asthma, next step may be CGRP -Seeing PCP in December, weight gain 15 lbs last year, worsening asthma, gotten out of pattern of exercise  2.  OSA on CPAP -Download shows continued leak, but tubing has been replaced in the last 2 weeks, bar graph data of the last 2 weeks looks better -If still feels because present, she should contact her DME -Continue to use CPAP nightly, greater than 4 hours -Follow-up in 6 months or sooner if needed  I spent 30 minutes of face-to-face and non-face-to-face time with patient.  This included previsit chart review, lab review, study review, order entry, electronic health record documentation, patient education.  Butler Denmark, AGNP-C, DNP 01/23/2020, 8:58 AM [facility] [address] [place], [place] [address] [phone]

## 2020-01-25 ENCOUNTER — Other Ambulatory Visit: Payer: Self-pay | Admitting: Nurse Practitioner

## 2020-01-25 ENCOUNTER — Other Ambulatory Visit: Payer: Self-pay | Admitting: Neurology

## 2020-01-25 DIAGNOSIS — M1A9XX0 Chronic gout, unspecified, without tophus (tophi): Secondary | ICD-10-CM

## 2020-01-25 DIAGNOSIS — K2100 Gastro-esophageal reflux disease with esophagitis, without bleeding: Secondary | ICD-10-CM

## 2020-01-25 DIAGNOSIS — E782 Mixed hyperlipidemia: Secondary | ICD-10-CM

## 2020-01-25 DIAGNOSIS — M1A00X0 Idiopathic chronic gout, unspecified site, without tophus (tophi): Secondary | ICD-10-CM

## 2020-01-25 DIAGNOSIS — J4520 Mild intermittent asthma, uncomplicated: Secondary | ICD-10-CM

## 2020-01-25 DIAGNOSIS — I10 Essential (primary) hypertension: Secondary | ICD-10-CM

## 2020-02-02 ENCOUNTER — Encounter: Payer: Medicare Other | Attending: Physical Medicine & Rehabilitation | Admitting: Physical Medicine & Rehabilitation

## 2020-02-02 ENCOUNTER — Encounter: Payer: Self-pay | Admitting: Physical Medicine & Rehabilitation

## 2020-02-02 ENCOUNTER — Other Ambulatory Visit: Payer: Self-pay

## 2020-02-02 VITALS — BP 173/81 | HR 72 | Temp 98.5°F | Ht 61.0 in | Wt 288.0 lb

## 2020-02-02 DIAGNOSIS — G894 Chronic pain syndrome: Secondary | ICD-10-CM | POA: Diagnosis not present

## 2020-02-02 DIAGNOSIS — Z79899 Other long term (current) drug therapy: Secondary | ICD-10-CM | POA: Insufficient documentation

## 2020-02-02 DIAGNOSIS — Z5181 Encounter for therapeutic drug level monitoring: Secondary | ICD-10-CM | POA: Diagnosis not present

## 2020-02-02 MED ORDER — TRAMADOL HCL 50 MG PO TABS: 50.0000 mg | ORAL_TABLET | Freq: Three times a day (TID) | ORAL | 5 refills | Status: DC

## 2020-02-02 MED ORDER — PREGABALIN 150 MG PO CAPS: 150.0000 mg | ORAL_CAPSULE | Freq: Two times a day (BID) | ORAL | 5 refills | Status: DC

## 2020-02-02 NOTE — Progress Notes (Signed)
Subjective:    Patient ID: Sandra Brennan, female    DOB: October 07, 1945, 74 y.o.   MRN: [id number]  HPI 73 year old female with primary complaint of widespread body pain. Patient has bilateral hip pain as well as low back pain.  Patient also complains of bilateral foot pain.  She has history of osteoarthritis of the midfoot bilaterally, left hip replacement as well as right hip osteoarthritis.  In addition she has a history of lumbar spinal stenosis.  The patient is morbidly obese and at one point she had lost 160 pounds at which time her pain was better.  She was also exercising at the [facility].  She states that her oral intake is pretty low and that she eats "only crackers and Jell-O" The patient is planning to call the [employer] to resume her exercise class  We discussed her current medications she is on Lyrica for fibromyalgia pain and has tolerated 150 mg twice daily Looking at last note plans were to increase tramadol however she is still taking 50 mg twice a day. We discussed increasing her dose to 3 times per day   Pain Inventory Average Pain 9 Pain Right Now 9 My pain is constant, tingling and aching  In the last 24 hours, has pain interfered with the following? General activity 9 Relation with others 0 Enjoyment of life 8 What TIME of day is your pain at its worst? morning , daytime, evening and night Sleep (in general) Fair  Pain is worse with: walking, bending, sitting, inactivity, standing and some activites Pain improves with: rest and medication Relief from Meds: 9  Family History  Problem Relation Age of Onset  . Arthritis Mother   . Arthritis Father   . Colon cancer Father   . Diabetes Sister   . Arthritis Sister   . Diabetes Maternal Grandmother   . Arthritis Maternal Grandmother   . Diabetes Maternal Grandfather   . Arthritis Maternal Grandfather    Social History   Socioeconomic History  . Marital status: Widowed    Spouse name: Not on file  . Number of  children: 5  . Years of education: PhD  . Highest education level: Not on file  Occupational History  . Occupation: Retired  Tobacco Use  . Smoking status: Former Smoker    Packs/day: 0.12    Years: 2.00    Pack years: 0.24    Types: Cigarettes  . Smokeless tobacco: Never Used  . Tobacco comment: 08/03/2012 "quit 30 years ago"  Vaping Use  . Vaping Use: Never used  Substance and Sexual Activity  . Alcohol use: No    Alcohol/week: 0.0 standard drinks    Comment: 08/03/2012 "used to be a beeralcoholic"; stopped ~ 30 yr ago"  . Drug use: No  . Sexual activity: Never  Other Topics Concern  . Not on file  Social History Narrative   Lives at home with her mother and caregiver.   Occasional use of caffeine.   Right-handed.   Social Determinants of Health   Financial Resource Strain: Low Risk   . Difficulty of Paying Living Expenses: Not hard at all  Food Insecurity: No Food Insecurity  . Worried About Charity fundraiser in the Last Year: Never true  . Ran Out of Food in the Last Year: Never true  Transportation Needs: No Transportation Needs  . Lack of Transportation (Medical): No  . Lack of Transportation (Non-Medical): No  Physical Activity:   . Days of Exercise per Week:  Not on file  . Minutes of Exercise per Session: Not on file  Stress:   . Feeling of Stress : Not on file  Social Connections:   . Frequency of Communication with Friends and Family: Not on file  . Frequency of Social Gatherings with Friends and Family: Not on file  . Attends Religious Services: Not on file  . Active Member of Clubs or Organizations: Not on file  . Attends Archivist Meetings: Not on file  . Marital Status: Not on file   Past Surgical History:  Procedure Laterality Date  . ABDOMINAL HYSTERECTOMY  1990's  . GANGLION CYST EXCISION Right 1980's   "wrist" (August 26, 2012)  . JOINT REPLACEMENT    . KNEE LIGAMENT RECONSTRUCTION Right 1980's  . TONSILLECTOMY  ~ 1954  . TOTAL HIP  ARTHROPLASTY Left 08/26/2012  . TOTAL HIP ARTHROPLASTY Left 08/26/12   Procedure: TOTAL HIP ARTHROPLASTY;  Surgeon: Garald Balding, MD;  Location: [place];  Service: Orthopedics;  Laterality: Left;  Left Total Hip Arthroplasty  . TOTAL HIP ARTHROPLASTY Left 08/28/2012   Procedure: Irrigation and Debridement hip ;  Surgeon: Mcarthur Rossetti, MD;  Location: [employer];  Service: Orthopedics;  Laterality: Left;  . TOTAL KNEE ARTHROPLASTY Left 2001  . TOTAL KNEE ARTHROPLASTY Right 2004   Past Surgical History:  Procedure Laterality Date  . ABDOMINAL HYSTERECTOMY  1990's  . GANGLION CYST EXCISION Right 1980's   "wrist" (2012/08/26)  . JOINT REPLACEMENT    . KNEE LIGAMENT RECONSTRUCTION Right 1980's  . TONSILLECTOMY  ~ 1954  . TOTAL HIP ARTHROPLASTY Left 08-26-2012  . TOTAL HIP ARTHROPLASTY Left 26-Aug-2012   Procedure: TOTAL HIP ARTHROPLASTY;  Surgeon: Garald Balding, MD;  Location: [place];  Service: Orthopedics;  Laterality: Left;  Left Total Hip Arthroplasty  . TOTAL HIP ARTHROPLASTY Left 08/28/2012   Procedure: Irrigation and Debridement hip ;  Surgeon: Mcarthur Rossetti, MD;  Location: [place];  Service: Orthopedics;  Laterality: Left;  . TOTAL KNEE ARTHROPLASTY Left 2001  . TOTAL KNEE ARTHROPLASTY Right 2004   Past Medical History:  Diagnosis Date  . Anemia   . Arthritis    "plenty" (08/26/12)  . Asthma   . Chronic lower back pain    "they say I need a whole new spinal column" (08/26/12)  . COPD (chronic obstructive pulmonary disease) ([place])   . Degenerative arthritis    "all over" (2012-08-26)  . Fx ankle   . GERD (gastroesophageal reflux disease)   . Gout 05/05/2013  . Hypertension   . Migraines    "used to; totally stopped when I quit drinking 30 yr ago" (08-26-2012)  . Myalgia and myositis   . Myocardial infarction [facility]) 1992   Aug 26, 2012 "mild MI when son died "  . Obesity   . Osteoporosis    "all over" (26-Aug-2012)  . Shortness of breath    when stressed.  . Sleep apnea     "never did fix my machine; haven't used one for 5 years; I've lost 116# since then; no problems now" (2012/08/26)  . Type II diabetes mellitus ([place])    "borderline; don't test; take Metformin" (08-26-2012)   BP (!) 173/81   Pulse 72   Temp 98.5 F (36.9 C)   Ht 5\' 1"  (1.549 m)   Wt 288 lb (130.6 kg)   SpO2 96%   BMI 54.42 kg/m   Opioid Risk Score:   Fall Risk Score:  `1  Depression screen PHQ 2/9  Depression screen  [facility] 2/9 08/23/2019 05/04/2019 07/12/2018 04/09/2018 04/09/2018 04/01/2017 02/26/2017  Decreased Interest 0 0 0 2 0 0 0  Down, Depressed, Hopeless 0 0 1 1 0 0 0  PHQ - 2 Score 0 0 1 3 0 0 0  Altered sleeping - - 3 3 - - -  Tired, decreased energy - - 0 2 - - -  Change in appetite - - 3 0 - - -  Feeling bad or failure about yourself  - - 1 0 - - -  Trouble concentrating - - 0 2 - - -  Moving slowly or fidgety/restless - - 0 0 - - -  Suicidal thoughts - - 0 0 - - -  PHQ-9 Score - - 8 10 - - -  Difficult doing work/chores - - - - - - -    Review of Systems  Constitutional: Negative.   HENT: Negative.   Eyes: Positive for visual disturbance.  Respiratory: Negative.   Cardiovascular: Negative.   Gastrointestinal: Negative.   Endocrine: Negative.   Genitourinary: Negative.   Musculoskeletal: Positive for arthralgias, back pain, gait problem, myalgias, neck pain and neck stiffness.  Skin: Negative.   Allergic/Immunologic: Negative.   Neurological: Positive for weakness, numbness and headaches.       Tingling  Psychiatric/Behavioral: Negative.   All other systems reviewed and are negative.      Objective:   Physical Exam Vitals and nursing note reviewed.  Constitutional:      Appearance: She is obese.  HENT:     Head: Normocephalic and atraumatic.  Eyes:     Extraocular Movements: Extraocular movements intact.     Conjunctiva/sclera: Conjunctivae normal.     Pupils: Pupils are equal, round, and reactive to light.  Musculoskeletal:     Comments: Reduced Hip ROM R  ~25% left ~50%   Skin:    General: Skin is warm and dry.  Neurological:     Mental Status: She is alert and oriented to person, place, and time.  Psychiatric:        Mood and Affect: Mood normal.        Behavior: Behavior normal.   Motor 4/5 in BLE Neg SLR BLE Amb with cane        Assessment & Plan:  #1.  Chronic multifactorial and multifocal pain complaints. Low back and lower extremity pain with walking.  This may represent neurogenic claudication related to worsening lumbar spinal stenosis.  Left lower extremity appears to be the most affected and based on her MRI from 2015, left L4-5 nerve root compression appears to be most prominent. The patient also has osteoarthritis in both feet which can be a source of pain as well.  2.  Left shoulder pain likely has rotator cuff tear with positive drop arm test.  Do not feel given her age and other comorbidities that she would be a great surgical candidate she will follow orthopedics.  3.  Right hip DJD severe patient will follow up with orthopedics on this, pain with ROM, sees Ortho for this 4.  Fibromyalgia syndrome this does not appear to be a major factor in her current pain complaints Will increase tramadol to 50 mg three times daily Lyrica increased to 150 mg twice daily NP visit in 19mo UDS today

## 2020-02-02 NOTE — Patient Instructions (Signed)

## 2020-02-09 LAB — TOXASSURE SELECT,+ANTIDEPR,UR

## 2020-02-13 ENCOUNTER — Telehealth: Payer: Self-pay | Admitting: *Deleted

## 2020-02-13 NOTE — Telephone Encounter (Signed)
Urine drug screen for this encounter is consistent for prescribed medication 

## 2020-02-14 ENCOUNTER — Other Ambulatory Visit: Payer: Self-pay | Admitting: Nurse Practitioner

## 2020-02-14 DIAGNOSIS — J4520 Mild intermittent asthma, uncomplicated: Secondary | ICD-10-CM

## 2020-02-14 NOTE — Telephone Encounter (Signed)
Last OV 08/23/19 Last fill 01/25/20  #6.7each/0

## 2020-02-27 ENCOUNTER — Ambulatory Visit: Payer: Medicare Other | Admitting: Nurse Practitioner

## 2020-02-28 ENCOUNTER — Other Ambulatory Visit: Payer: Self-pay | Admitting: Nurse Practitioner

## 2020-02-28 ENCOUNTER — Other Ambulatory Visit: Payer: Self-pay | Admitting: Neurology

## 2020-02-28 DIAGNOSIS — J4520 Mild intermittent asthma, uncomplicated: Secondary | ICD-10-CM

## 2020-02-29 ENCOUNTER — Ambulatory Visit: Payer: Medicare Other | Admitting: Nurse Practitioner

## 2020-02-29 NOTE — Telephone Encounter (Signed)
Pt returned call and states Rx is not needed at this time.

## 2020-02-29 NOTE — Telephone Encounter (Signed)
LVM for patient to return call to confirm medication was requested by her.

## 2020-03-12 DIAGNOSIS — G4733 Obstructive sleep apnea (adult) (pediatric): Secondary | ICD-10-CM | POA: Diagnosis not present

## 2020-03-23 ENCOUNTER — Encounter: Payer: Self-pay | Admitting: Nurse Practitioner

## 2020-03-23 DIAGNOSIS — Z1231 Encounter for screening mammogram for malignant neoplasm of breast: Secondary | ICD-10-CM | POA: Diagnosis not present

## 2020-03-26 ENCOUNTER — Ambulatory Visit (INDEPENDENT_AMBULATORY_CARE_PROVIDER_SITE_OTHER): Payer: Medicare Other | Admitting: Nurse Practitioner

## 2020-03-26 ENCOUNTER — Encounter: Payer: Self-pay | Admitting: Nurse Practitioner

## 2020-03-26 ENCOUNTER — Other Ambulatory Visit: Payer: Self-pay

## 2020-03-26 VITALS — BP 140/80 | HR 88 | Temp 97.8°F | Ht 59.0 in | Wt 291.6 lb

## 2020-03-26 DIAGNOSIS — R2681 Unsteadiness on feet: Secondary | ICD-10-CM

## 2020-03-26 DIAGNOSIS — E559 Vitamin D deficiency, unspecified: Secondary | ICD-10-CM

## 2020-03-26 DIAGNOSIS — E1142 Type 2 diabetes mellitus with diabetic polyneuropathy: Secondary | ICD-10-CM

## 2020-03-26 DIAGNOSIS — L304 Erythema intertrigo: Secondary | ICD-10-CM | POA: Diagnosis not present

## 2020-03-26 DIAGNOSIS — E790 Hyperuricemia without signs of inflammatory arthritis and tophaceous disease: Secondary | ICD-10-CM | POA: Diagnosis not present

## 2020-03-26 DIAGNOSIS — D649 Anemia, unspecified: Secondary | ICD-10-CM | POA: Diagnosis not present

## 2020-03-26 DIAGNOSIS — I10 Essential (primary) hypertension: Secondary | ICD-10-CM | POA: Diagnosis not present

## 2020-03-26 DIAGNOSIS — M19041 Primary osteoarthritis, right hand: Secondary | ICD-10-CM

## 2020-03-26 DIAGNOSIS — M19042 Primary osteoarthritis, left hand: Secondary | ICD-10-CM | POA: Diagnosis not present

## 2020-03-26 MED ORDER — NYSTATIN 100000 UNIT/GM EX POWD
1.0000 | Freq: Three times a day (TID) | CUTANEOUS | 5 refills | Status: DC
Start: 2020-03-26 — End: 2020-08-23

## 2020-03-26 MED ORDER — VITAMIN D (ERGOCALCIFEROL) 1.25 MG (50000 UNIT) PO CAPS: 50000.0000 [IU] | ORAL_CAPSULE | ORAL | 0 refills | Status: DC

## 2020-03-26 NOTE — Patient Instructions (Addendum)
CPAP  Machine is managed by Dr. Rhea Pink office ([facility])  Go to lab for blood draw.  Schedule annual diabetic eye exam. Have report faxed to me.  Start daily exercise: walking 42mins 3x/day.Chair exercise 43mins a day Maintain DASH diet. You will be contacted to schedule appt with PT and OT.  Call [facility] to schedule appt: [phone].  [email or web address]">  DASH Eating Plan DASH stands for Dietary Approaches to Stop Hypertension. The DASH eating plan is a healthy eating plan that has been shown to:  Reduce high blood pressure (hypertension).  Reduce your risk for type 2 diabetes, heart disease, and stroke.  Help with weight loss. What are tips for following this plan? Reading food labels  Check food labels for the amount of salt (sodium) per serving. Choose foods with less than 5 percent of the Daily Value of sodium. Generally, foods with less than 300 milligrams (mg) of sodium per serving fit into this eating plan.  To find whole grains, look for the word "whole" as the first word in the ingredient list. Shopping  Buy products labeled as "low-sodium" or "no salt added."  Buy fresh foods. Avoid canned foods and pre-made or frozen meals. Cooking  Avoid adding salt when cooking. Use salt-free seasonings or herbs instead of table salt or sea salt. Check with your health care provider or pharmacist before using salt substitutes.  Do not fry foods. Cook foods using healthy methods such as baking, boiling, grilling, roasting, and broiling instead.  Cook with heart-healthy oils, such as olive, canola, avocado, soybean, or sunflower oil. Meal planning  Eat a balanced diet that includes: ? 4 or more servings of fruits and 4 or more servings of vegetables each day. Try to fill one-half of your plate with fruits and vegetables. ? 6-8 servings of whole grains each day. ? Less than 6 oz (170 g) of lean  meat, poultry, or fish each day. A 3-oz (85-g) serving of meat is about the same size as a deck of cards. One egg equals 1 oz (28 g). ? 2-3 servings of low-fat dairy each day. One serving is 1 cup (237 mL). ? 1 serving of nuts, seeds, or beans 5 times each week. ? 2-3 servings of heart-healthy fats. Healthy fats called omega-3 fatty acids are found in foods such as walnuts, flaxseeds, fortified milks, and eggs. These fats are also found in cold-water fish, such as sardines, salmon, and mackerel.  Limit how much you eat of: ? Canned or prepackaged foods. ? Food that is high in trans fat, such as some fried foods. ? Food that is high in saturated fat, such as fatty meat. ? Desserts and other sweets, sugary drinks, and other foods with added sugar. ? Full-fat dairy products.  Do not salt foods before eating.  Do not eat more than 4 egg yolks a week.  Try to eat at least 2 vegetarian meals a week.  Eat more home-cooked food and less restaurant, buffet, and fast food.   Lifestyle  When eating at a restaurant, ask that your food be prepared with less salt or no salt, if possible.  If you drink alcohol: ? Limit how much you use to:  0-1 drink a day for women who are not pregnant.  0-2 drinks a day for men. ? Be aware of how much alcohol is in your drink. In the [place] one drink equals one 12 oz bottle of beer (355 mL), one 5 oz glass  of wine (148 mL), or one 1 oz glass of hard liquor (44 mL). General information  Avoid eating more than 2,300 mg of salt a day. If you have hypertension, you may need to reduce your sodium intake to 1,500 mg a day.  Work with your health care provider to maintain a healthy body weight or to lose weight. Ask what an ideal weight is for you.  Get at least 30 minutes of exercise that causes your heart to beat faster (aerobic exercise) most days of the week. Activities may include walking, swimming, or biking.  Work with your health care provider or dietitian  to adjust your eating plan to your individual calorie needs. What foods should I eat? Fruits All fresh, dried, or frozen fruit. Canned fruit in natural juice (without added sugar). Vegetables Fresh or frozen vegetables (raw, steamed, roasted, or grilled). Low-sodium or reduced-sodium tomato and vegetable juice. Low-sodium or reduced-sodium tomato sauce and tomato paste. Low-sodium or reduced-sodium canned vegetables. Grains Whole-grain or whole-wheat bread. Whole-grain or whole-wheat pasta. Brown rice. [place] Morrow. Bulgur. Whole-grain and low-sodium cereals. Pita bread. Low-fat, low-sodium crackers. Whole-wheat flour tortillas. Meats and other proteins Skinless chicken or [place]. Ground chicken or [place]. Pork with fat trimmed off. Fish and seafood. Egg whites. Dried beans, peas, or lentils. Unsalted nuts, nut butters, and seeds. Unsalted canned beans. Lean cuts of beef with fat trimmed off. Low-sodium, lean precooked or cured meat, such as sausages or meat loaves. Dairy Low-fat (1%) or fat-free (skim) milk. Reduced-fat, low-fat, or fat-free cheeses. Nonfat, low-sodium ricotta or cottage cheese. Low-fat or nonfat yogurt. Low-fat, low-sodium cheese. Fats and oils Soft margarine without trans fats. Vegetable oil. Reduced-fat, low-fat, or light mayonnaise and salad dressings (reduced-sodium). Canola, safflower, olive, avocado, soybean, and sunflower oils. Avocado. Seasonings and condiments Herbs. Spices. Seasoning mixes without salt. Other foods Unsalted popcorn and pretzels. Fat-free sweets. The items listed above may not be a complete list of foods and beverages you can eat. Contact a dietitian for more information. What foods should I avoid? Fruits Canned fruit in a light or heavy syrup. Fried fruit. Fruit in cream or butter sauce. Vegetables Creamed or fried vegetables. Vegetables in a cheese sauce. Regular canned vegetables (not low-sodium or reduced-sodium). Regular canned tomato sauce  and paste (not low-sodium or reduced-sodium). Regular tomato and vegetable juice (not low-sodium or reduced-sodium). Angie Fava. Olives. Grains Baked goods made with fat, such as croissants, muffins, or some breads. Dry pasta or rice meal packs. Meats and other proteins Fatty cuts of meat. Ribs. Fried meat. Berniece Salines. Bologna, salami, and other precooked or cured meats, such as sausages or meat loaves. Fat from the back of a pig (fatback). Bratwurst. Salted nuts and seeds. Canned beans with added salt. Canned or smoked fish. Whole eggs or egg yolks. Chicken or [place] with skin. Dairy Whole or 2% milk, cream, and half-and-half. Whole or full-fat cream cheese. Whole-fat or sweetened yogurt. Full-fat cheese. Nondairy creamers. Whipped toppings. Processed cheese and cheese spreads. Fats and oils Butter. Stick margarine. Lard. Shortening. Ghee. Bacon fat. Tropical oils, such as coconut, palm kernel, or palm oil. Seasonings and condiments Onion salt, garlic salt, seasoned salt, table salt, and sea salt. Worcestershire sauce. Tartar sauce. Barbecue sauce. Teriyaki sauce. Soy sauce, including reduced-sodium. Steak sauce. Canned and packaged gravies. Fish sauce. Oyster sauce. Cocktail sauce. Store-bought horseradish. Ketchup. Mustard. Meat flavorings and tenderizers. Bouillon cubes. Hot sauces. Pre-made or packaged marinades. Pre-made or packaged taco seasonings. Relishes. Regular salad dressings. Other foods Salted popcorn and pretzels. The  items listed above may not be a complete list of foods and beverages you should avoid. Contact a dietitian for more information. Where to find more information  National Heart, Lung, and Blood Institute: PopSteam.is  American Heart Association: www.heart.org  Academy of Nutrition and Dietetics: www.eatright.org  National Kidney Foundation: www.kidney.org Summary  The DASH eating plan is a healthy eating plan that has been shown to reduce high blood pressure  (hypertension). It may also reduce your risk for type 2 diabetes, heart disease, and stroke.  When on the DASH eating plan, aim to eat more fresh fruits and vegetables, whole grains, lean proteins, low-fat dairy, and heart-healthy fats.  With the DASH eating plan, you should limit salt (sodium) intake to 2,300 mg a day. If you have hypertension, you may need to reduce your sodium intake to 1,500 mg a day.  Work with your health care provider or dietitian to adjust your eating plan to your individual calorie needs. This information is not intended to replace advice given to you by your health care provider. Make sure you discuss any questions you have with your health care provider. Document Revised: 01/21/2019 Document Reviewed: 01/21/2019 Elsevier Patient Education  2021 [employer].

## 2020-03-26 NOTE — Progress Notes (Signed)
Subjective:  Patient ID: Sandra Brennan, female    DOB: [birth year]  Age: 75 y.o. MRN: [id number]  CC: Follow-up (6 month f/u on HTN and hyperlipidemia)  HPI Due to multiple joint pain and inactivity, Ms. Almendarez reports increased difficulty with ambulation and ADLs. She reports several missed falls at home. She easily gets fatigued wit short distance. Daily activity consist mostly of sitting. She use a cane but does not think it is adequate to maintain stability. She has borrowed a walked but has difficulty maintaining grasp due to arthritis is hands. She has a shower chair and grab bars. Chronic pain management by Dr. Lynford Citizen with use of tramadol and Lyrica.  Hypertension BP at goal with lisinopril/hctz BP Readings from Last 3 Encounters:  03/26/20 140/80  02/02/20 (!) 173/81  01/23/20 128/74   Repeat BMP Maintain current medications.  Type 2 diabetes mellitus with diabetic polyneuropathy, without long-term current use of insulin (HCC) Diet controlled. Neuropathy stable with use of lyrica She plans to schedule appt with podiatry for diabetic shoes and eval of plantar fasciitis. Advised to schedule appt with ophthalmology for diabetic eye exam. Use of atorvastatin Repeat HgbA1c and lipid panel  Intertrigo Chronic, waxing and waning Due to excess skin folds No signs of cellulitis Improved with nystatin powder Refill provided  BP Readings from Last 3 Encounters:  03/26/20 140/80  02/02/20 (!) 173/81  01/23/20 128/74   Wt Readings from Last 3 Encounters:  03/26/20 291 lb 9.6 oz (132.3 kg)  02/02/20 288 lb (130.6 kg)  01/23/20 287 lb (130.2 kg)   Reviewed past Medical, Social and Family history today.  Outpatient Medications Prior to Visit  Medication Sig Dispense Refill  . albuterol (VENTOLIN HFA) 108 (90 Base) MCG/ACT inhaler INHALE 1-2 PUFFS INTO THE LUNGS EVERY 6 (SIX) HOURS AS NEEDED FOR WHEEZING OR SHORTNESS OF BREATH. 6.7 each 0  . allopurinol (ZYLOPRIM)  100 MG tablet TAKE 1 TABLET BY MOUTH EVERY DAY 90 tablet 3  . aspirin EC 81 MG tablet Take 81 mg by mouth daily.    Marland Kitchen atorvastatin (LIPITOR) 10 MG tablet TAKE 1 TABLET (10 MG TOTAL) BY MOUTH DAILY AT 6 PM. 90 tablet 3  . Carboxymethylcellulose Sodium (EYE DROPS OP) Apply to eye. Lubricant eye drops    . cetirizine (ZYRTEC) 10 MG tablet Take 10 mg by mouth daily.    . cycloSPORINE (RESTASIS) 0.05 % ophthalmic emulsion Restasis 0.05 % eye drops in a dropperette    . lisinopril-hydrochlorothiazide (ZESTORETIC) 10-12.5 MG tablet TAKE 1 TABLET BY MOUTH EVERY DAY 90 tablet 1  . Multiple Vitamin (MULTIVITAMIN) tablet Take 1 tablet by mouth daily.    Marland Kitchen omeprazole (PRILOSEC) 20 MG capsule TAKE 1 CAPSULE BY MOUTH EVERY DAY 90 capsule 1  . OVER THE COUNTER MEDICATION Cystic magnesium    . pregabalin (LYRICA) 150 MG capsule Take 1 capsule (150 mg total) by mouth 2 (two) times daily. 60 capsule 5  . Sennosides 8.6 MG CAPS Take by mouth.    . SYMBICORT 160-4.5 MCG/ACT inhaler INHALE 2 PUFFS INTO THE LUNGS IN THE MORNING AND AT BEDTIME. RINSE MOUTH AFTER EACH USE 30.6 Inhaler 7  . traMADol (ULTRAM) 50 MG tablet Take 1 tablet (50 mg total) by mouth 3 (three) times daily. 90 tablet 5  . cholecalciferol (VITAMIN D) 1000 units tablet Take 1 tablet (1,000 Units total) by mouth daily. (Patient taking differently: Take 1,000 Units by mouth 2 (two) times daily.)    . nystatin (MYCOSTATIN/NYSTOP) powder  Apply 1 application topically 2 (two) times daily. 45 g 1  . Ubrogepant (UBRELVY) 50 MG TABS Take 50 mg by mouth as needed (take 1 at onset of headache, may repeat once 2 hours after initial dosing). (Patient not taking: Reported on 03/26/2020) 12 tablet 11   No facility-administered medications prior to visit.    ROS See HPI  Objective:  BP 140/80 (BP Location: Left Arm, Patient Position: Sitting, Cuff Size: Large)   Pulse 88   Temp 97.8 F (36.6 C) (Temporal)   Ht 4\' 11"  (1.499 m)   Wt 291 lb 9.6 oz (132.3 kg)    SpO2 98%   BMI 58.90 kg/m   Physical Exam Constitutional:      Appearance: She is obese.  Cardiovascular:     Rate and Rhythm: Normal rate and regular rhythm.     Pulses: Normal pulses.     Heart sounds: Normal heart sounds.  Pulmonary:     Effort: Pulmonary effort is normal.     Breath sounds: Normal breath sounds.  Musculoskeletal:     Right lower leg: Edema present.     Left lower leg: Edema present.     Comments: Ambulates with cane  Neurological:     Mental Status: She is alert and oriented to person, place, and time.    Assessment & Plan:  This visit occurred during the SARS-CoV-2 public health emergency.  Safety protocols were in place, including screening questions prior to the visit, additional usage of staff PPE, and extensive cleaning of exam room while observing appropriate contact time as indicated for disinfecting solutions.   Etoya was seen today for follow-up.  Diagnoses and all orders for this visit:  Primary hypertension -     Basic metabolic panel  Type 2 diabetes mellitus with diabetic polyneuropathy, without long-term current use of insulin (HCC) -     Hemoglobin A1c -     Basic metabolic panel -     Microalbumin / creatinine urine ratio  Vitamin D deficiency -     Vitamin D, Ergocalciferol, (DRISDOL) 1.25 MG (50000 UNIT) CAPS capsule; Take 1 capsule (50,000 Units total) by mouth every 7 (seven) days.  Unsteady gait when walking -     Ambulatory referral to Physical Therapy -     For home use only DME 4 wheeled rolling walker with seat [id number])  Intertrigo -     nystatin (MYCOSTATIN/NYSTOP) powder; Apply 1 application topically 3 (three) times daily.  Arthritis of both hands -     Ambulatory referral to Physical Therapy -     Ambulatory referral to Occupational Therapy  Hyperuricemia -     Uric acid  Anemia, unspecified type -     CBC w/Diff  Schedule annual diabetic eye exam. Have report faxed to me. Start daily exercise: walking  14mins 3x/day. Chair exercise 21mins a day Maintain DASH diet. You will be contacted to schedule appt with PT and OT. Call [facility] to schedule appt: [phone].  Problem List Items Addressed This Visit      Cardiovascular and Mediastinum   Hypertension - Primary (Chronic)    BP at goal with lisinopril/hctz BP Readings from Last 3 Encounters:  03/26/20 140/80  02/02/20 (!) 173/81  01/23/20 128/74   Repeat BMP Maintain current medications.      Relevant Orders   Basic metabolic panel (Completed)     Endocrine   Type 2 diabetes mellitus with diabetic polyneuropathy, without long-term current use of  insulin (HCC) (Chronic)    Diet controlled. Neuropathy stable with use of lyrica She plans to schedule appt with podiatry for diabetic shoes and eval of plantar fasciitis. Advised to schedule appt with ophthalmology for diabetic eye exam. Use of atorvastatin Repeat HgbA1c and lipid panel      Relevant Orders   Hemoglobin A1c (Completed)   Basic metabolic panel (Completed)   Microalbumin / creatinine urine ratio (Completed)     Musculoskeletal and Integument   Intertrigo    Chronic, waxing and waning Due to excess skin folds No signs of cellulitis Improved with nystatin powder Refill provided      Relevant Medications   nystatin (MYCOSTATIN/NYSTOP) powder     Other   Anemia   Relevant Orders   CBC w/Diff (Completed)   Vitamin D deficiency   Relevant Medications   Vitamin D, Ergocalciferol, (DRISDOL) 1.25 MG (50000 UNIT) CAPS capsule    Other Visit Diagnoses    Unsteady gait when walking       Relevant Orders   Ambulatory referral to Physical Therapy   For home use only DME 4 wheeled rolling walker with seat ([id number])   Arthritis of both hands       Relevant Orders   Ambulatory referral to Physical Therapy   Ambulatory referral to Occupational Therapy   Hyperuricemia       Relevant Orders   Uric acid (Completed)      Follow-up: Return in  about 3 months (around 06/24/2020) for DM and HTN (94mins).  Wilfred Lacy, NP

## 2020-03-27 DIAGNOSIS — L304 Erythema intertrigo: Secondary | ICD-10-CM | POA: Insufficient documentation

## 2020-03-27 LAB — CBC WITH DIFFERENTIAL/PLATELET
Basophils Absolute: 0.1 10*3/uL (ref 0.0–0.1)
Basophils Relative: 1.4 % (ref 0.0–3.0)
Eosinophils Absolute: 0.3 10*3/uL (ref 0.0–0.7)
Eosinophils Relative: 3.7 % (ref 0.0–5.0)
HCT: 34.4 % — ABNORMAL LOW (ref 36.0–46.0)
Hemoglobin: 10.8 g/dL — ABNORMAL LOW (ref 12.0–15.0)
Lymphocytes Relative: 30.2 % (ref 12.0–46.0)
Lymphs Abs: 2.1 10*3/uL (ref 0.7–4.0)
MCHC: 31.5 g/dL (ref 30.0–36.0)
MCV: 88 fl (ref 78.0–100.0)
Monocytes Absolute: 0.5 10*3/uL (ref 0.1–1.0)
Monocytes Relative: 7.1 % (ref 3.0–12.0)
Neutro Abs: 4 10*3/uL (ref 1.4–7.7)
Neutrophils Relative %: 57.6 % (ref 43.0–77.0)
Platelets: 219 10*3/uL (ref 150.0–400.0)
RBC: 3.9 Mil/uL (ref 3.87–5.11)
RDW: 14 % (ref 11.5–15.5)
WBC: 6.9 10*3/uL (ref 4.0–10.5)

## 2020-03-27 LAB — BASIC METABOLIC PANEL
BUN: 18 mg/dL (ref 6–23)
CO2: 27 mEq/L (ref 19–32)
Calcium: 8.9 mg/dL (ref 8.4–10.5)
Chloride: 104 mEq/L (ref 96–112)
Creatinine, Ser: 1.17 mg/dL (ref 0.40–1.20)
GFR: 46.52 mL/min — ABNORMAL LOW (ref >60.00)
Glucose, Bld: 85 mg/dL (ref 70–99)
Potassium: 4.1 mEq/L (ref 3.5–5.1)
Sodium: 139 mEq/L (ref 135–145)

## 2020-03-27 LAB — URIC ACID: Uric Acid, Serum: 7.3 mg/dL — ABNORMAL HIGH (ref 2.4–7.0)

## 2020-03-27 LAB — MICROALBUMIN / CREATININE URINE RATIO
Creatinine,U: 42.9 mg/dL
Microalb Creat Ratio: 1.6 mg/g (ref 0.0–30.0)
Microalb, Ur: <0.7 mg/dL (ref 0.0–1.9)

## 2020-03-27 LAB — HEMOGLOBIN A1C: Hgb A1c MFr Bld: 6.2 % (ref 4.6–6.5)

## 2020-03-27 NOTE — Assessment & Plan Note (Signed)
BP at goal with lisinopril/hctz BP Readings from Last 3 Encounters:  03/26/20 140/80  02/02/20 (!) 173/81  01/23/20 128/74   Repeat BMP Maintain current medications.

## 2020-03-27 NOTE — Assessment & Plan Note (Signed)
Chronic, waxing and waning Due to excess skin folds No signs of cellulitis Improved with nystatin powder Refill provided

## 2020-03-27 NOTE — Assessment & Plan Note (Signed)
Diet controlled. Neuropathy stable with use of lyrica She plans to schedule appt with podiatry for diabetic shoes and eval of plantar fasciitis. Advised to schedule appt with ophthalmology for diabetic eye exam. Use of atorvastatin Repeat HgbA1c and lipid panel

## 2020-04-12 ENCOUNTER — Other Ambulatory Visit: Payer: Self-pay

## 2020-04-12 ENCOUNTER — Ambulatory Visit: Payer: Medicare Other | Attending: Nurse Practitioner | Admitting: Physical Therapy

## 2020-04-12 ENCOUNTER — Encounter: Payer: Self-pay | Admitting: Physical Therapy

## 2020-04-12 DIAGNOSIS — R262 Difficulty in walking, not elsewhere classified: Secondary | ICD-10-CM | POA: Insufficient documentation

## 2020-04-12 DIAGNOSIS — R2689 Other abnormalities of gait and mobility: Secondary | ICD-10-CM | POA: Diagnosis not present

## 2020-04-12 DIAGNOSIS — M6281 Muscle weakness (generalized): Secondary | ICD-10-CM | POA: Diagnosis not present

## 2020-04-12 DIAGNOSIS — M79641 Pain in right hand: Secondary | ICD-10-CM | POA: Insufficient documentation

## 2020-04-12 DIAGNOSIS — M79642 Pain in left hand: Secondary | ICD-10-CM | POA: Insufficient documentation

## 2020-04-12 NOTE — Therapy (Signed)
[place], [place], [address] Phone: [phone]   Fax:  [phone]  Physical Therapy Evaluation  Patient Details  Name: Sandra Brennan MRN: [id number] Date of Birth: 07/01/1945 Referring Provider (PT): Flossie Buffy, NP   Encounter Date: 04/12/2020   PT End of Session - 04/12/20 1234    Visit Number 1    Number of Visits 12    Date for PT Re-Evaluation 05/24/20    Authorization Type UHC MCR, [place] secondary, progress note by visit 10, recheck FOTO at visit 6    PT Start Time 0844    PT Stop Time 0935    PT Time Calculation (min) 51 min    Activity Tolerance Patient limited by pain    Behavior During Therapy [facility] for tasks assessed/performed           Past Medical History:  Diagnosis Date  . Anemia   . Arthritis    "plenty" (2012-08-05)  . Asthma   . Chronic lower back pain    "they say I need a whole new spinal column" (08-05-12)  . COPD (chronic obstructive pulmonary disease) ([place])   . Degenerative arthritis    "all over" (08-05-12)  . Fx ankle   . GERD (gastroesophageal reflux disease)   . Gout 05/05/2013  . Hypertension   . Migraines    "used to; totally stopped when I quit drinking 30 yr ago" (08/05/12)  . Myalgia and myositis   . Myocardial infarction [facility]) 1992   2012/08/05 "mild MI when son died "  . Obesity   . Osteoporosis    "all over" (08-05-12)  . Shortness of breath    when stressed.  . Sleep apnea    "never did fix my machine; haven't used one for 5 years; I've lost 116# since then; no problems now" (2012-08-05)  . Type II diabetes mellitus ([place])    "borderline; don't test; take Metformin" (2012/08/05)    Past Surgical History:  Procedure Laterality Date  . ABDOMINAL HYSTERECTOMY  1990's  . GANGLION CYST EXCISION Right 1980's   "wrist" (05-Aug-2012)  . JOINT REPLACEMENT    . KNEE LIGAMENT RECONSTRUCTION Right 1980's  . TONSILLECTOMY  ~ 1954  . TOTAL HIP ARTHROPLASTY Left  2012/08/05  . TOTAL HIP ARTHROPLASTY Left 2012/08/05   Procedure: TOTAL HIP ARTHROPLASTY;  Surgeon: Garald Balding, MD;  Location: [place];  Service: Orthopedics;  Laterality: Left;  Left Total Hip Arthroplasty  . TOTAL HIP ARTHROPLASTY Left 08/28/2012   Procedure: Irrigation and Debridement hip ;  Surgeon: Mcarthur Rossetti, MD;  Location: [place];  Service: Orthopedics;  Laterality: Left;  . TOTAL KNEE ARTHROPLASTY Left 2001  . TOTAL KNEE ARTHROPLASTY Right 2004    There were no vitals filed for this visit.    Subjective Assessment - 04/12/20 0853    Subjective Pt. is a 75 y/o female referred to PT for unsteady gait as well as hand OA. Hand briefly included with today's eval but pt. has received referral for OT hand therapy for this so pending timeframe of hand evaluation plan focus PT rehab on gait. Pt. reports approximately 1 year history of gradually increasing difficulty with her walking while dealing with multiple chronic pain issues and including 2 falls within the past 6 months. She is currently using a "[employer]" for ambulation but has difficulty with transfers and prolonged standing and walking. PMH is significant for bilateral TKA and left THA with subsequent surgery secondary to  infection, also for cardiopulmonary history (see below) but no history of neurological pathology. For her hands she reports an approximately 8 month history of issues where both of her hands intermittently "lock up" with difficulty straightening hand/fingers after.    Pertinent History Diabetic, chronic pain, bilat. TKA, left THA, MI, COPD-uses CPAP, right ankle fx., osteoporosis, obesity    Limitations Walking;Standing;Lifting;House hold activities    How long can you stand comfortably? unable comfortably    How long can you walk comfortably? unable comfortably    Patient Stated Goals Improve walking and decrease pain    Currently in Pain? Yes    Pain Score 8     Pain Location --   bilat. shoulders, hips  left>right, feet   Pain Orientation Left;Right    Pain Descriptors / Indicators Aching;Sharp    Pain Type Chronic pain    Pain Onset More than a month ago    Pain Frequency Constant    Aggravating Factors  activity, standing, walking    Pain Relieving Factors medication    Effect of Pain on Daily Activities limited activity and positional tolerance              [facility] PT Assessment - 04/12/20 0001      Assessment   Medical Diagnosis Unsteady gait, hand OA    Referring Provider (PT) Flossie Buffy, NP    Onset Date/Surgical Date 04/13/19   estimated per subjective report of 1 year history symptom exacerbation   Hand Dominance Right    Prior Therapy past PT for knees and hip      Precautions   Precautions Fall      Restrictions   Weight Bearing Restrictions No      Balance Screen   Has the patient fallen in the past 6 months Yes    How many times? 2      [place] residence    Transport planner;Other relatives   lives with mother and grandson, assists mother as caregiver   Type of Home Apartment    Home Access Level entry    Home Layout One level    Home Equipment Shower seat;Other (comment)   has "Hurry cane"   Additional Comments has lift chair-sleeps in chair      Prior Function   Level of Independence Independent with community mobility with device      Cognition   Overall Cognitive Status Within Functional Limits for tasks assessed      Observation/Other Assessments   Focus on Therapeutic Outcomes (FOTO)  38% function      Sensation   Light Touch Impaired Detail    Light Touch Impaired Details Impaired LLE;Impaired RLE    Additional Comments decreased sensation at L5 dermatome on right and S1 dermatome on left      Deep Tendon Reflexes   DTR Assessment Site Patella;Achilles    Patella DTR 1+    Achilles DTR 2+      ROM / Strength   AROM / PROM / Strength AROM;Strength      AROM   Overall AROM Comments  eval performed in sitting/standing only due to pain so no formal LE AROM measurements but bilat. LE AROM grossly WFL for transfers and ambulation      Strength   Overall Strength Comments Right grip 5 lbs., left grip 8 lbs. with grip dynamonometer    Strength Assessment Site Ankle;Knee;Hip;Wrist    Right/Left Wrist Right;Left    Right Wrist  Flexion 4/5    Right Wrist Extension 4/5    Left Wrist Flexion 4/5    Left Wrist Extension 4/5    Right/Left Hip Right;Left    Right Hip Flexion 3-/5    Left Hip Flexion 3-/5    Right/Left Knee Right;Left    Right Knee Flexion 4/5    Right Knee Extension 4/5    Left Knee Flexion 4-/5    Left Knee Extension 4+/5    Right/Left Ankle Right;Left    Right Ankle Dorsiflexion 4/5    Right Ankle Inversion 4-/5    Right Ankle Eversion 4-/5    Left Ankle Dorsiflexion 4/5    Left Ankle Inversion 4-/5    Left Ankle Eversion 4-/5      Flexibility   Soft Tissue Assessment /Muscle Length no      Transfers   Transfers Sit to Stand;Stand to Sit    Sit to Stand 6: Modified independent (Device/Increase time)   used "Hurry cane"   Stand to Sit 6: Modified independent (Device/Increase time)   used "Hurry cane"     Ambulation/Gait   Gait Comments Tinetti Gait + Balance 11/28, pt. ambulates  in clinic mod I with "[employer]" with wide BOS, decreased bilateral steplength and foot clearance and antalgic gait pattern      Balance   Balance Assessed Yes      Static Standing Balance   Static Standing - Comment/# of Minutes unable SLS bilat., Romberg even surface feet apart eyes closed with moderate sway                      Objective measurements completed on examination: See above findings.               PT Education - 04/12/20 1233    Education Details eval findings-Tinetti results for high fall risk, POC, pt. had not yet been contacted regarding setting up OT hand therpay eval so provided with clinic contact information    Person(s)  Educated Patient    Methods Explanation    Comprehension Verbalized understanding            PT Short Term Goals - 04/12/20 1248      PT SHORT TERM GOAL #1   Title Independent with initial/basic HEP    Baseline pending instruction at first follow up visit    Time 2    Period Weeks    Status New      PT SHORT TERM GOAL #2   Title Review pt. FOTO self report by visit 3    Time 2    Period Weeks    Status New      PT SHORT TERM GOAL #3   Title Assess 5 times sit<>stand to establish baseline for improving ability for transfers    Baseline not assessed at eval    Time 2    Period Weeks    Status New             PT Long Term Goals - 04/12/20 1251      PT LONG TERM GOAL #1   Title Improve FOTO outcome measure score to 43% functional status or greater    Baseline 38%    Time 6    Period Weeks    Status New    Target Date 05/24/20      PT LONG TERM GOAL #2   Title Improve Tinetti Gait + Balance score at least 5 points to work towards decreased  fall risk    Baseline 11/28    Time 6    Period Weeks    Status New    Target Date 05/24/20      PT LONG TERM GOAL #3   Title Improve bilat. LE strength at least grossly 1/2 MMT grade to improve ability for transfers from low chairs and bed mobility for sit<>supine    Baseline see objective    Time 6    Period Weeks    Status New    Target Date 05/24/20      PT LONG TERM GOAL #4   Title 5 times sit<>stand goal to be set pending assessment                  Plan - 04/12/20 1237    Clinical Impression Statement Pt. presents with multifactorial decreased balance and impaired gait with Tinetti Gait + Balance score indicative of high fall risk. Contributing findings include general muscle weakness, decreased proprioceptive ability and multi-joint chronic pain with high number of medical comorbidities. For hands pt. presents with hand/grip weakness with underlying OA-pending scheduling of hand therapy evaluation to  focus on this plan focus PT visits on gait/balance issues. Pt. would benefit from PT to work on improving gait, balance and strength to improve safety with mobility and work towards decreasing fall risk.    Personal Factors and Comorbidities Time since onset of injury/illness/exacerbation;Comorbidity 3+    Comorbidities see PMH    Examination-Activity Limitations Dressing;Bathing;Transfers;Lift;Squat;Locomotion Level;Stairs;Stand;Carry;Bed Mobility    Examination-Participation Restrictions Meal Prep;Shop;Community Activity;Laundry;Cleaning    Stability/Clinical Decision Making Evolving/Moderate complexity    Clinical Decision Making Moderate    Rehab Potential Fair    PT Frequency 2x / week    PT Duration 6 weeks    PT Treatment/Interventions ADLs/Self Care Home Management;Cryotherapy;Electrical Stimulation;Iontophoresis 4mg /ml Dexamethasone;Moist Heat;Therapeutic activities;Gait training;Therapeutic exercise;Patient/family education;Manual techniques;Dry needling;Balance training;Neuromuscular re-education;Functional mobility training;Stair training    PT Next Visit Plan review FOTO pt. report by visit 3, add HEP pending tolerance due to pain, check 5 times sit<>stand pending tolerance due to pain (held at eval), try Airex heel/toe raises and marches, standing balance, general LE strengthening as tolerated pending pain with open and closed chain activities, plan focus gait/balance but prn address hands with grip and wrist strengthening, manual and modalities    PT Home Exercise Plan no time for HEP at eval    Consulted and Agree with Plan of Care Patient           Patient will benefit from skilled therapeutic intervention in order to improve the following deficits and impairments:  Pain,Impaired UE functional use,Decreased strength,Decreased activity tolerance,Abnormal gait,Decreased balance,Impaired sensation,Obesity,Difficulty walking,Decreased endurance  Visit Diagnosis: Other abnormalities  of gait and mobility  Muscle weakness (generalized)  Difficulty in walking, not elsewhere classified  Pain in left hand  Pain in right hand     Problem List Patient Active Problem List   Diagnosis Date Noted  . Intertrigo 03/27/2020  . Anemia 08/23/2019  . Polyethylene liner wear following total hip arthroplasty requiring isolated polyethylene liner exchange ([place]) 07/12/2019  . Adjustment disorder with mixed anxiety and depressed mood 07/12/2018  . Migraine without aura and without status migrainosus, not intractable 09/10/2017  . Post-traumatic osteoarthritis of right ankle 06/09/2016  . Spondylosis without myelopathy or radiculopathy, lumbar region 06/09/2016  . Cervical spinal stenosis 06/09/2016  . Spinal stenosis, lumbar region, without neurogenic claudication 06/09/2016  . Chronic gouty arthritis 06/04/2016  . Vitamin D deficiency 06/02/2016  . Hyperlipidemia 06/02/2016  .  Closed right ankle fracture 05/01/2016  . Colon polyps 05/01/2016  . Fibromyalgia syndrome 05/01/2016  . Syncope 03/30/2016  . Chest pain 03/30/2016  . Fall 03/30/2016  . GERD with esophagitis 06/21/2015  . Pharyngeal dysphagia 06/21/2015  . Obesity 05/06/2013  . Acute nontraumatic kidney injury ([place]) 05/05/2013  . Acute posthemorrhagic anemia 09/06/2012  . Gout 09/05/2012  . Osteoarthritis of left hip 08/05/2012  . Type 2 diabetes mellitus with diabetic polyneuropathy, without long-term current use of insulin ([place]) 08/05/2012  . Hypertension 08/05/2012  . Asthma, chronic 08/05/2012  . Sleep apnea 08/05/2012    Beaulah Dinning, PT, DPT 04/12/20 1:07 PM  [facility] [address] [place], [place], [address] Phone: [phone]   Fax:  [phone]  Name: Sandra Brennan MRN: [id number] Date of Birth: [birth year]

## 2020-04-17 ENCOUNTER — Encounter: Payer: Self-pay | Admitting: Physical Therapy

## 2020-04-17 ENCOUNTER — Other Ambulatory Visit: Payer: Self-pay

## 2020-04-17 ENCOUNTER — Ambulatory Visit: Payer: Medicare Other | Admitting: Physical Therapy

## 2020-04-17 DIAGNOSIS — M79642 Pain in left hand: Secondary | ICD-10-CM | POA: Diagnosis not present

## 2020-04-17 DIAGNOSIS — M79641 Pain in right hand: Secondary | ICD-10-CM | POA: Diagnosis not present

## 2020-04-17 DIAGNOSIS — R262 Difficulty in walking, not elsewhere classified: Secondary | ICD-10-CM

## 2020-04-17 DIAGNOSIS — M6281 Muscle weakness (generalized): Secondary | ICD-10-CM | POA: Diagnosis not present

## 2020-04-17 DIAGNOSIS — R2689 Other abnormalities of gait and mobility: Secondary | ICD-10-CM | POA: Diagnosis not present

## 2020-04-17 NOTE — Therapy (Signed)
[place], [place], [address] Phone: [phone]   Fax:  [phone]  Physical Therapy Treatment  Patient Details  Name: Sandra Brennan MRN: [id number] Date of Birth: 1945-03-29 Referring Provider (PT): Flossie Buffy, NP   Encounter Date: 04/17/2020   PT End of Session - 04/17/20 1101    Visit Number 2    Number of Visits 12    Date for PT Re-Evaluation 05/24/20    Authorization Type UHC MCR, [place] secondary, progress note by visit 10, recheck FOTO at visit 6    PT Start Time 1050    PT Stop Time 1130    PT Time Calculation (min) 40 min    Activity Tolerance Patient limited by pain;Patient limited by fatigue    Behavior During Therapy [facility] for tasks assessed/performed           Past Medical History:  Diagnosis Date  . Anemia   . Arthritis    "plenty" (2012-08-12)  . Asthma   . Chronic lower back pain    "they say I need a whole new spinal column" (Aug 12, 2012)  . COPD (chronic obstructive pulmonary disease) ([place])   . Degenerative arthritis    "all over" (2012/08/12)  . Fx ankle   . GERD (gastroesophageal reflux disease)   . Gout 05/05/2013  . Hypertension   . Migraines    "used to; totally stopped when I quit drinking 30 yr ago" (08/12/2012)  . Myalgia and myositis   . Myocardial infarction [facility]) 1992   Aug 12, 2012 "mild MI when son died "  . Obesity   . Osteoporosis    "all over" (08-12-2012)  . Shortness of breath    when stressed.  . Sleep apnea    "never did fix my machine; haven't used one for 5 years; I've lost 116# since then; no problems now" (08/12/12)  . Type II diabetes mellitus ([place])    "borderline; don't test; take Metformin" (12-Aug-2012)    Past Surgical History:  Procedure Laterality Date  . ABDOMINAL HYSTERECTOMY  1990's  . GANGLION CYST EXCISION Right 1980's   "wrist" (08/12/12)  . JOINT REPLACEMENT    . KNEE LIGAMENT RECONSTRUCTION Right 1980's  . TONSILLECTOMY  ~ 1954  .  TOTAL HIP ARTHROPLASTY Left 12-Aug-2012  . TOTAL HIP ARTHROPLASTY Left 08/12/2012   Procedure: TOTAL HIP ARTHROPLASTY;  Surgeon: Garald Balding, MD;  Location: [place];  Service: Orthopedics;  Laterality: Left;  Left Total Hip Arthroplasty  . TOTAL HIP ARTHROPLASTY Left 08/28/2012   Procedure: Irrigation and Debridement hip ;  Surgeon: Mcarthur Rossetti, MD;  Location: [place];  Service: Orthopedics;  Laterality: Left;  . TOTAL KNEE ARTHROPLASTY Left 2001  . TOTAL KNEE ARTHROPLASTY Right 2004    There were no vitals filed for this visit.   Subjective Assessment - 04/17/20 1056    Subjective Pain and tightness in her hands today.  She is having difficulty breathing, especially with her mask.  She uses her rollator walker to walk up to the neighborhood [employer] [facility]. Slight headache.    Currently in Pain? Yes    Pain Score 8     Pain Location Generalized    Pain Descriptors / Indicators Aching;Other (Comment)   vibration   Pain Type Chronic pain    Pain Frequency Constant    Aggravating Factors  activity, standing, walking but now also sitting    Pain Relieving Factors meds    Multiple Pain Sites No  [place] Adult PT Treatment/Exercise - 04/17/20 0001      Transfers   Five time sit to stand comments  22 sec no device , hands on thighs      Exercises   Other Exercises  wrist extensor stretching , wrist flex, ext ROM and putty for grip      Knee/Hip Exercises: Standing   Heel Raises Both;1 set;15 reps    Heel Raises Limitations decr WB on RLE    Hip Abduction Both;2 sets;10 reps    Hip Extension Both;2 sets;10 reps      Knee/Hip Exercises: Seated   Long Arc Quad Strengthening;Both;1 set                  PT Education - 04/17/20 1611    Education Details HEP for hands, LE and balance    Person(s) Educated Patient    Methods Explanation;Handout    Comprehension Verbalized understanding;Returned demonstration            PT Short Term  Goals - 04/17/20 1612      PT SHORT TERM GOAL #1   Title Independent with initial/basic HEP    Status On-going      PT SHORT TERM GOAL #2   Title Review pt. FOTO self report by visit 3    Status Achieved      PT SHORT TERM GOAL #3   Title Assess 5 times sit<>stand to establish baseline for improving ability for transfers    Status Achieved             PT Long Term Goals - 04/12/20 1251      PT LONG TERM GOAL #1   Title Improve FOTO outcome measure score to 43% functional status or greater    Baseline 38%    Time 6    Period Weeks    Status New    Target Date 05/24/20      PT LONG TERM GOAL #2   Title Improve Tinetti Gait + Balance score at least 5 points to work towards decreased fall risk    Baseline 11/28    Time 6    Period Weeks    Status New    Target Date 05/24/20      PT LONG TERM GOAL #3   Title Improve bilat. LE strength at least grossly 1/2 MMT grade to improve ability for transfers from low chairs and bed mobility for sit<>supine    Baseline see objective    Time 6    Period Weeks    Status New    Target Date 05/24/20      PT LONG TERM GOAL #4   Title 5 times sit<>stand goal to be set pending assessment                 Plan - 04/17/20 1101    Clinical Impression Statement Pt with limited tolerance for exercise today, had difficulty breathing and general pain in joints and muscles. Given basic HEP for hands and LE.  Vital sign were normal with lowest O2 sat 92%  and BP 155/83.  Cont to progress mobility as tolerated with rest breaks for deconditioning.    PT Treatment/Interventions ADLs/Self Care Home Management;Cryotherapy;Electrical Stimulation;Iontophoresis 4mg /ml Dexamethasone;Moist Heat;Therapeutic activities;Gait training;Therapeutic exercise;Patient/family education;Manual techniques;Dry needling;Balance training;Neuromuscular re-education;Functional mobility training;Stair training    PT Next Visit Plan try 2 min walk test.  Check HEP.  PRogress as tol. Has putty    PT Home Exercise Plan see pt instructions  Consulted and Agree with Plan of Care Patient           Patient will benefit from skilled therapeutic intervention in order to improve the following deficits and impairments:  Pain,Impaired UE functional use,Decreased strength,Decreased activity tolerance,Abnormal gait,Decreased balance,Impaired sensation,Obesity,Difficulty walking,Decreased endurance  Visit Diagnosis: Other abnormalities of gait and mobility  Muscle weakness (generalized)  Difficulty in walking, not elsewhere classified  Pain in left hand  Pain in right hand     Problem List Patient Active Problem List   Diagnosis Date Noted  . Intertrigo 03/27/2020  . Anemia 08/23/2019  . Polyethylene liner wear following total hip arthroplasty requiring isolated polyethylene liner exchange ([place]) 07/12/2019  . Adjustment disorder with mixed anxiety and depressed mood 07/12/2018  . Migraine without aura and without status migrainosus, not intractable 09/10/2017  . Post-traumatic osteoarthritis of right ankle 06/09/2016  . Spondylosis without myelopathy or radiculopathy, lumbar region 06/09/2016  . Cervical spinal stenosis 06/09/2016  . Spinal stenosis, lumbar region, without neurogenic claudication 06/09/2016  . Chronic gouty arthritis 06/04/2016  . Vitamin D deficiency 06/02/2016  . Hyperlipidemia 06/02/2016  . Closed right ankle fracture 05/01/2016  . Colon polyps 05/01/2016  . Fibromyalgia syndrome 05/01/2016  . Syncope 03/30/2016  . Chest pain 03/30/2016  . Fall 03/30/2016  . GERD with esophagitis 06/21/2015  . Pharyngeal dysphagia 06/21/2015  . Obesity 05/06/2013  . Acute nontraumatic kidney injury ([place]) 05/05/2013  . Acute posthemorrhagic anemia 09/06/2012  . Gout 09/05/2012  . Osteoarthritis of left hip 08/05/2012  . Type 2 diabetes mellitus with diabetic polyneuropathy, without long-term current use of insulin ([place]) 08/05/2012  .  Hypertension 08/05/2012  . Asthma, chronic 08/05/2012  . Sleep apnea 08/05/2012    Khaleem Burchill 04/17/2020, 4:30 PM  [place], [place], [address] Phone: [phone]   Fax:  [phone]  Name: ALLANNAH KEMPEN MRN: [id number] Date of Birth: Oct 19, 1945  Raeford Razor, PT 04/17/20 4:30 PM Phone: [phone] Fax: [phone]

## 2020-04-17 NOTE — Patient Instructions (Signed)
Access Code: [email or web address]: [email or web address]: 02/15/2022Prepared by: Anderson Malta PaaExercises  Sit to Stand Without Arm Support - 1 x daily - 7 x weekly - 1 sets - 10 reps - 30 hold  Standing Hip Abduction with Counter Support - 1 x daily - 7 x weekly - 2 sets - 10 reps  Standing Hip Extension with Counter Support - 1 x daily - 7 x weekly - 2 sets - 10 reps  Standing Heel Raise with Support - 1 x daily - 7 x weekly - 2 sets - 10 reps  Seated Long Arc Quad - 1 x daily - 7 x weekly - 2 sets - 10 reps  Seated Wrist Flexion with Overpressure - 1 x daily - 7 x weekly - 1 sets - 5 reps - 30 hold  Putty Squeezes - 1 x daily - 7 x weekly - 1 sets - 5 reps - 30 hold

## 2020-04-19 ENCOUNTER — Ambulatory Visit: Payer: Medicare Other

## 2020-04-19 ENCOUNTER — Other Ambulatory Visit: Payer: Self-pay

## 2020-04-19 DIAGNOSIS — R2689 Other abnormalities of gait and mobility: Secondary | ICD-10-CM

## 2020-04-19 DIAGNOSIS — M79641 Pain in right hand: Secondary | ICD-10-CM

## 2020-04-19 DIAGNOSIS — M79642 Pain in left hand: Secondary | ICD-10-CM

## 2020-04-19 DIAGNOSIS — R262 Difficulty in walking, not elsewhere classified: Secondary | ICD-10-CM

## 2020-04-19 DIAGNOSIS — M6281 Muscle weakness (generalized): Secondary | ICD-10-CM

## 2020-04-19 NOTE — Therapy (Signed)
[facility] [place], [place], [address] Phone: [phone]   Fax:  [phone]  Physical Therapy Treatment  Patient Details  Name: Sandra Brennan MRN: [id number] Date of Birth: Nov 26, 1945 Referring Provider (PT): Flossie Buffy, NP   Encounter Date: 04/19/2020   PT End of Session - 04/19/20 1832    Visit Number 3    Number of Visits 12    Date for PT Re-Evaluation 05/24/20    Authorization Type UHC MCR, [place] secondary, progress note by visit 10, recheck FOTO at visit 6    PT Start Time 0370   pt arrived late   PT Stop Time 1920    PT Time Calculation (min) 47 min    Activity Tolerance Patient limited by pain;Patient limited by fatigue    Behavior During Therapy [facility] for tasks assessed/performed           Past Medical History:  Diagnosis Date  . Anemia   . Arthritis    "plenty" (2012-08-11)  . Asthma   . Chronic lower back pain    "they say I need a whole new spinal column" (11-Aug-2012)  . COPD (chronic obstructive pulmonary disease) ([place])   . Degenerative arthritis    "all over" (2012-08-11)  . Fx ankle   . GERD (gastroesophageal reflux disease)   . Gout 05/05/2013  . Hypertension   . Migraines    "used to; totally stopped when I quit drinking 30 yr ago" (08-11-2012)  . Myalgia and myositis   . Myocardial infarction [facility]) 1992   08/11/12 "mild MI when son died "  . Obesity   . Osteoporosis    "all over" (2012-08-11)  . Shortness of breath    when stressed.  . Sleep apnea    "never did fix my machine; haven't used one for 5 years; I've lost 116# since then; no problems now" (Aug 11, 2012)  . Type II diabetes mellitus ([place])    "borderline; don't test; take Metformin" (11-Aug-2012)    Past Surgical History:  Procedure Laterality Date  . ABDOMINAL HYSTERECTOMY  1990's  . GANGLION CYST EXCISION Right 1980's   "wrist" (08-11-12)  . JOINT REPLACEMENT    . KNEE LIGAMENT RECONSTRUCTION Right 1980's  .  TONSILLECTOMY  ~ 1954  . TOTAL HIP ARTHROPLASTY Left Aug 11, 2012  . TOTAL HIP ARTHROPLASTY Left 2012-08-11   Procedure: TOTAL HIP ARTHROPLASTY;  Surgeon: Garald Balding, MD;  Location: [place];  Service: Orthopedics;  Laterality: Left;  Left Total Hip Arthroplasty  . TOTAL HIP ARTHROPLASTY Left 08/28/2012   Procedure: Irrigation and Debridement hip ;  Surgeon: Mcarthur Rossetti, MD;  Location: [place];  Service: Orthopedics;  Laterality: Left;  . TOTAL KNEE ARTHROPLASTY Left 2001  . TOTAL KNEE ARTHROPLASTY Right 2004    There were no vitals filed for this visit.   Subjective Assessment - 04/19/20 1832    Subjective "I'm in pain all the time. My hands are still tight but not as tight because she gave me that putty - I can close them now and hardly could before."    Pertinent History Diabetic, chronic pain, bilat. TKA, left THA, MI, COPD-uses CPAP, right ankle fx., osteoporosis, obesity    Limitations Walking;Standing;Lifting;House hold activities    How long can you stand comfortably? unable comfortably    How long can you walk comfortably? unable comfortably    Patient Stated Goals Improve walking and decrease pain    Currently in Pain? Yes    Pain  Score 9     Pain Location Generalized   mostly in shoulders, hips, and feet   Pain Orientation Right;Left    Pain Type Chronic pain    Pain Onset More than a month ago              [facility] PT Assessment - 04/19/20 0001      Assessment   Medical Diagnosis Unsteady gait, hand OA    Referring Provider (PT) Flossie Buffy, NP    Onset Date/Surgical Date 04/13/19                         [facility] Adult PT Treatment/Exercise - 04/19/20 0001      Transfers   Transfer Cueing Sit <> supine transfer with cues - demonstration for technique and verbal cues while performing    Comments 2MWT 97 feet with 3-point cane. 1 brief standing rest break after ~65 feet.      Ambulation/Gait   Ambulation/Gait Yes    Ambulation/Gait  Assistance 6: Modified independent (Device/Increase time)    Ambulation Distance (Feet) 75 Feet    Assistive device --   hurrycane   Gait Pattern Step-through pattern    Ambulation Surface Level;Indoor    Gait Comments Gait training again at end of session in addition to 2MWT to assess response to RLE 1/2 inch heel lift.      Self-Care   Self-Care Other Self-Care Comments    Other Self-Care Comments  See patient education      Knee/Hip Exercises: Standing   Hip Flexion Limitations Alternating marches x 20 (10x each LE) with UE support on plinth    Hip Abduction Stengthening;Both;2 sets;10 reps;Knee straight    Abduction Limitations UE support on plinth and forward flexed posture - cues for upright posture    Hip Extension --    Extension Limitations --      Knee/Hip Exercises: Seated   Long Arc Quad Strengthening;Both;1 set;15 reps      Manual Therapy   Manual therapy comments RLE .5 inch heel lift provided after leg length assessment                  PT Education - 04/19/20 1939    Education Details Advised to continue HEP and monitor response to R 3-layer heel lift. Demonstrated and reviewed technique for supine <> sit and sit <> stand transfers. Objective pulse oximeter findings.    Person(s) Educated Patient    Methods Explanation;Tactile cues;Verbal cues;Demonstration    Comprehension Verbalized understanding;Returned demonstration            PT Short Term Goals - 04/17/20 1612      PT SHORT TERM GOAL #1   Title Independent with initial/basic HEP    Status On-going      PT SHORT TERM GOAL #2   Title Review pt. FOTO self report by visit 3    Status Achieved      PT SHORT TERM GOAL #3   Title Assess 5 times sit<>stand to establish baseline for improving ability for transfers    Status Achieved             PT Long Term Goals - 04/12/20 1251      PT LONG TERM GOAL #1   Title Improve FOTO outcome measure score to 43% functional status or greater     Baseline 38%    Time 6    Period Weeks    Status New  Target Date 05/24/20      PT LONG TERM GOAL #2   Title Improve Tinetti Gait + Balance score at least 5 points to work towards decreased fall risk    Baseline 11/28    Time 6    Period Weeks    Status New    Target Date 05/24/20      PT LONG TERM GOAL #3   Title Improve bilat. LE strength at least grossly 1/2 MMT grade to improve ability for transfers from low chairs and bed mobility for sit<>supine    Baseline see objective    Time 6    Period Weeks    Status New    Target Date 05/24/20      PT LONG TERM GOAL #4   Title 5 times sit<>stand goal to be set pending assessment                 Plan - 04/19/20 1848    Clinical Impression Statement Limited tolerance secondary to DOE, generalized pain, and fatigue with activity. SaO2 94-96% and HR ranging from 86-101 bpm throughout session. Pt mentioned difficulty applying equal weight through BLE secondary to leg length discrepancy after L THA 2014. Measurement from ASIS to medial malleoli revealed RLE 31 inches and LLE 32 inches but difficult to accurately palpate landmarks secondary to adipose tissue, TTP, and edema. 3-layer heel lift placed in R shoe with pt expressing that it feels better to walk at end of session, and she states "I feel like I can stand". Advised pt to monitor response to heel lift and remove if increase in pain occurs. She should continue to benefit from skilled PT intervention to improve functional mobility.    Personal Factors and Comorbidities Time since onset of injury/illness/exacerbation;Comorbidity 3+    Comorbidities see PMH    Examination-Activity Limitations Dressing;Bathing;Transfers;Lift;Squat;Locomotion Level;Stairs;Stand;Carry;Bed Mobility    PT Treatment/Interventions ADLs/Self Care Home Management;Cryotherapy;Electrical Stimulation;Iontophoresis 4mg /ml Dexamethasone;Moist Heat;Therapeutic activities;Gait training;Therapeutic  exercise;Patient/family education;Manual techniques;Dry needling;Balance training;Neuromuscular re-education;Functional mobility training;Stair training    PT Next Visit Plan tAssess response to R 3-layer heel lift. Check HEP. Progress as tol. Has putty    PT Home Exercise Plan see pt instructions    Consulted and Agree with Plan of Care Patient           Patient will benefit from skilled therapeutic intervention in order to improve the following deficits and impairments:  Pain,Impaired UE functional use,Decreased strength,Decreased activity tolerance,Abnormal gait,Decreased balance,Impaired sensation,Obesity,Difficulty walking,Decreased endurance  Visit Diagnosis: Other abnormalities of gait and mobility  Muscle weakness (generalized)  Difficulty in walking, not elsewhere classified  Pain in left hand  Pain in right hand     Problem List Patient Active Problem List   Diagnosis Date Noted  . Intertrigo 03/27/2020  . Anemia 08/23/2019  . Polyethylene liner wear following total hip arthroplasty requiring isolated polyethylene liner exchange ([place]) 07/12/2019  . Adjustment disorder with mixed anxiety and depressed mood 07/12/2018  . Migraine without aura and without status migrainosus, not intractable 09/10/2017  . Post-traumatic osteoarthritis of right ankle 06/09/2016  . Spondylosis without myelopathy or radiculopathy, lumbar region 06/09/2016  . Cervical spinal stenosis 06/09/2016  . Spinal stenosis, lumbar region, without neurogenic claudication 06/09/2016  . Chronic gouty arthritis 06/04/2016  . Vitamin D deficiency 06/02/2016  . Hyperlipidemia 06/02/2016  . Closed right ankle fracture 05/01/2016  . Colon polyps 05/01/2016  . Fibromyalgia syndrome 05/01/2016  . Syncope 03/30/2016  . Chest pain 03/30/2016  . Fall 03/30/2016  . GERD with  esophagitis 06/21/2015  . Pharyngeal dysphagia 06/21/2015  . Obesity 05/06/2013  . Acute nontraumatic kidney injury ([place]) 05/05/2013   . Acute posthemorrhagic anemia 09/06/2012  . Gout 09/05/2012  . Osteoarthritis of left hip 08/05/2012  . Type 2 diabetes mellitus with diabetic polyneuropathy, without long-term current use of insulin ([place]) 08/05/2012  . Hypertension 08/05/2012  . Asthma, chronic 08/05/2012  . Sleep apnea 08/05/2012      Sandra Brennan, PT, DPT 04/19/20 7:41 PM  [facility] [address] [place], [place], [address] Phone: [phone]   Fax:  [phone]  Name: Sandra Brennan MRN: [id number] Date of Birth: March 17, 1945

## 2020-04-22 ENCOUNTER — Other Ambulatory Visit: Payer: Self-pay | Admitting: Nurse Practitioner

## 2020-04-22 DIAGNOSIS — J4520 Mild intermittent asthma, uncomplicated: Secondary | ICD-10-CM

## 2020-04-22 DIAGNOSIS — E559 Vitamin D deficiency, unspecified: Secondary | ICD-10-CM

## 2020-04-25 ENCOUNTER — Other Ambulatory Visit: Payer: Self-pay

## 2020-04-25 ENCOUNTER — Ambulatory Visit: Payer: Medicare Other

## 2020-04-25 DIAGNOSIS — M79641 Pain in right hand: Secondary | ICD-10-CM

## 2020-04-25 DIAGNOSIS — R2689 Other abnormalities of gait and mobility: Secondary | ICD-10-CM

## 2020-04-25 DIAGNOSIS — M79642 Pain in left hand: Secondary | ICD-10-CM

## 2020-04-25 DIAGNOSIS — R262 Difficulty in walking, not elsewhere classified: Secondary | ICD-10-CM | POA: Diagnosis not present

## 2020-04-25 DIAGNOSIS — M6281 Muscle weakness (generalized): Secondary | ICD-10-CM

## 2020-04-25 NOTE — Therapy (Signed)
[place], [place], [address] Phone: [phone]   Fax:  [phone]  Physical Therapy Treatment  Patient Details  Name: Sandra Brennan MRN: [id number] Date of Birth: 1945-10-27 Referring Provider (PT): Flossie Buffy, NP   Encounter Date: 04/25/2020   PT End of Session - 04/25/20 1836    Visit Number 4    Number of Visits 12    Date for PT Re-Evaluation 05/24/20    Authorization Type UHC MCR, [place] secondary, progress note by visit 10, recheck FOTO at visit 6    PT Start Time 1837   pt arrived late   PT Stop Time 1920    PT Time Calculation (min) 43 min    Activity Tolerance Patient limited by pain;Patient limited by fatigue    Behavior During Therapy [facility] for tasks assessed/performed           Past Medical History:  Diagnosis Date  . Anemia   . Arthritis    "plenty" (August 21, 2012)  . Asthma   . Chronic lower back pain    "they say I need a whole new spinal column" (August 21, 2012)  . COPD (chronic obstructive pulmonary disease) ([place])   . Degenerative arthritis    "all over" (August 21, 2012)  . Fx ankle   . GERD (gastroesophageal reflux disease)   . Gout 05/05/2013  . Hypertension   . Migraines    "used to; totally stopped when I quit drinking 30 yr ago" (2012-08-21)  . Myalgia and myositis   . Myocardial infarction [facility]) 1992   August 21, 2012 "mild MI when son died "  . Obesity   . Osteoporosis    "all over" (21-Aug-2012)  . Shortness of breath    when stressed.  . Sleep apnea    "never did fix my machine; haven't used one for 5 years; I've lost 116# since then; no problems now" (21-Aug-2012)  . Type II diabetes mellitus ([place])    "borderline; don't test; take Metformin" (08-21-2012)    Past Surgical History:  Procedure Laterality Date  . ABDOMINAL HYSTERECTOMY  1990's  . GANGLION CYST EXCISION Right 1980's   "wrist" (21-Aug-2012)  . JOINT REPLACEMENT    . KNEE LIGAMENT RECONSTRUCTION Right 1980's  .  TONSILLECTOMY  ~ 1954  . TOTAL HIP ARTHROPLASTY Left 2012/08/21  . TOTAL HIP ARTHROPLASTY Left 08-21-2012   Procedure: TOTAL HIP ARTHROPLASTY;  Surgeon: Garald Balding, MD;  Location: [place];  Service: Orthopedics;  Laterality: Left;  Left Total Hip Arthroplasty  . TOTAL HIP ARTHROPLASTY Left 08/28/2012   Procedure: Irrigation and Debridement hip ;  Surgeon: Mcarthur Rossetti, MD;  Location: [place];  Service: Orthopedics;  Laterality: Left;  . TOTAL KNEE ARTHROPLASTY Left 2001  . TOTAL KNEE ARTHROPLASTY Right 2004    There were no vitals filed for this visit.   Subjective Assessment - 04/25/20 1837    Subjective "The heel lift helped with my pain a lot, but it started hurting on the bottom of my foot where it ended. My hands hurt the most right now."    Pertinent History Diabetic, chronic pain, bilat. TKA, left THA, MI, COPD-uses CPAP, right ankle fx., osteoporosis, obesity    Limitations Walking;Standing;Lifting;House hold activities    How long can you stand comfortably? unable comfortably    How long can you walk comfortably? unable comfortably    Patient Stated Goals Improve walking and decrease pain    Currently in Pain? Yes    Pain Score 8  Pain Location Generalized   mostly in hands and toes today   Pain Orientation Right;Left    Pain Descriptors / Indicators Aching;Other (Comment)   vibration   Pain Type Chronic pain    Pain Onset More than a month ago              [facility] PT Assessment - 04/25/20 0001      Assessment   Medical Diagnosis Unsteady gait, hand OA    Referring Provider (PT) Flossie Buffy, NP    Onset Date/Surgical Date 04/13/19                         [facility] Adult PT Treatment/Exercise - 04/25/20 0001      Ambulation/Gait   Ambulation/Gait Yes    Ambulation/Gait Assistance 6: Modified independent (Device/Increase time)    Ambulation Distance (Feet) 90 Feet    Assistive device --   hurrycane   Gait Pattern Step-through pattern     Ambulation Surface Level;Indoor      Self-Care   Self-Care Other Self-Care Comments    Other Self-Care Comments  See patient education      Knee/Hip Exercises: Standing   Hip Flexion Limitations Alternating marches x 30 (15x each LE) with UE support on plinth    Hip Abduction Stengthening;Both;15 reps;Knee straight    Abduction Limitations UE support on plinth and forward flexed posture - cues for upright posture    Hip Extension Stengthening;Both;15 reps;Knee straight    Extension Limitations cues for upright posture and glute activation      Knee/Hip Exercises: Seated   Long Arc Quad Strengthening;Both;2 sets;15 reps    Sit to [employer] 2 sets;10 reps   UE on thighs. low mat raised to 21 inches from ground     Hand Exercises   Rubberbands Digit extension x 20 each hand    Other Hand Exercises Digiflex x 20 with all digits on each hand                  PT Education - 04/25/20 1927    Education Details Advised to continue HEP. Provided orthotic insert referral and instructed patient to make an appointment with physician for confirmation that patient may benefit from insert for improved gait and pain reduction. Objective pulse oximeter findings.    Person(s) Educated Patient    Methods Explanation;Demonstration;Tactile cues;Verbal cues    Comprehension Verbalized understanding;Returned demonstration            PT Short Term Goals - 04/25/20 1938      PT SHORT TERM GOAL #1   Title Independent with initial/basic HEP    Status On-going      PT SHORT TERM GOAL #2   Title Review pt. FOTO self report by visit 3    Status Achieved      PT SHORT TERM GOAL #3   Title Assess 5 times sit<>stand to establish baseline for improving ability for transfers    Status Achieved             PT Long Term Goals - 04/12/20 1251      PT LONG TERM GOAL #1   Title Improve FOTO outcome measure score to 43% functional status or greater    Baseline 38%    Time 6    Period Weeks     Status New    Target Date 05/24/20      PT LONG TERM GOAL #2   Title Improve Tinetti Gait +  Balance score at least 5 points to work towards decreased fall risk    Baseline 11/28    Time 6    Period Weeks    Status New    Target Date 05/24/20      PT LONG TERM GOAL #3   Title Improve bilat. LE strength at least grossly 1/2 MMT grade to improve ability for transfers from low chairs and bed mobility for sit<>supine    Baseline see objective    Time 6    Period Weeks    Status New    Target Date 05/24/20      PT LONG TERM GOAL #4   Title 5 times sit<>stand goal to be set pending assessment                 Plan - 04/25/20 1930    Clinical Impression Statement Patient was able to perform alternating marches, hip abduction, and sit to stand prior to requiring seated rest break. SaO2 remaining 97-99% and HR ranging from 101-114 bpm throughout session. Pt removed heel lift from her R shoe due to pain in R foot but had ease of symptoms during gait with heel lift overall, so PT provided orthotic insert referral for patient to return to physician. She should continue to benefit from skilled PT intervention to address deficits and improve tolerance with functional mobility.    Personal Factors and Comorbidities Time since onset of injury/illness/exacerbation;Comorbidity 3+    Comorbidities see PMH    Examination-Activity Limitations Dressing;Bathing;Transfers;Lift;Squat;Locomotion Level;Stairs;Stand;Carry;Bed Mobility    PT Treatment/Interventions ADLs/Self Care Home Management;Cryotherapy;Electrical Stimulation;Iontophoresis 4mg /ml Dexamethasone;Moist Heat;Therapeutic activities;Gait training;Therapeutic exercise;Patient/family education;Manual techniques;Dry needling;Balance training;Neuromuscular re-education;Functional mobility training;Stair training    PT Next Visit Plan Inquire about patient scheduling appointment with physician regarding orthotic insert referral. Check HEP. Progress  as tolerate. Has putty and rubber bands    PT Home Exercise Plan see pt instructions    Recommended Other Services orthotic insert referral    Consulted and Agree with Plan of Care Patient           Patient will benefit from skilled therapeutic intervention in order to improve the following deficits and impairments:  Pain,Impaired UE functional use,Decreased strength,Decreased activity tolerance,Abnormal gait,Decreased balance,Impaired sensation,Obesity,Difficulty walking,Decreased endurance  Visit Diagnosis: Other abnormalities of gait and mobility  Muscle weakness (generalized)  Difficulty in walking, not elsewhere classified  Pain in left hand  Pain in right hand     Problem List Patient Active Problem List   Diagnosis Date Noted  . Intertrigo 03/27/2020  . Anemia 08/23/2019  . Polyethylene liner wear following total hip arthroplasty requiring isolated polyethylene liner exchange ([place]) 07/12/2019  . Adjustment disorder with mixed anxiety and depressed mood 07/12/2018  . Migraine without aura and without status migrainosus, not intractable 09/10/2017  . Post-traumatic osteoarthritis of right ankle 06/09/2016  . Spondylosis without myelopathy or radiculopathy, lumbar region 06/09/2016  . Cervical spinal stenosis 06/09/2016  . Spinal stenosis, lumbar region, without neurogenic claudication 06/09/2016  . Chronic gouty arthritis 06/04/2016  . Vitamin D deficiency 06/02/2016  . Hyperlipidemia 06/02/2016  . Closed right ankle fracture 05/01/2016  . Colon polyps 05/01/2016  . Fibromyalgia syndrome 05/01/2016  . Syncope 03/30/2016  . Chest pain 03/30/2016  . Fall 03/30/2016  . GERD with esophagitis 06/21/2015  . Pharyngeal dysphagia 06/21/2015  . Obesity 05/06/2013  . Acute nontraumatic kidney injury ([place]) 05/05/2013  . Acute posthemorrhagic anemia 09/06/2012  . Gout 09/05/2012  . Osteoarthritis of left hip 08/05/2012  . Type 2 diabetes  mellitus with diabetic  polyneuropathy, without long-term current use of insulin ([place]) 08/05/2012  . Hypertension 08/05/2012  . Asthma, chronic 08/05/2012  . Sleep apnea 08/05/2012       Haydee Monica, PT, DPT 04/25/20 7:44 PM  [facility] [address] [place], [place], [address] Phone: [phone]   Fax:  [phone]  Name: Sandra Brennan MRN: [id number] Date of Birth: 1945/07/22

## 2020-05-02 ENCOUNTER — Other Ambulatory Visit: Payer: Self-pay

## 2020-05-02 ENCOUNTER — Ambulatory Visit: Payer: Medicare Other | Attending: Nurse Practitioner | Admitting: Physical Therapy

## 2020-05-02 ENCOUNTER — Encounter: Payer: Self-pay | Admitting: Physical Therapy

## 2020-05-02 VITALS — HR 76

## 2020-05-02 DIAGNOSIS — R2689 Other abnormalities of gait and mobility: Secondary | ICD-10-CM | POA: Diagnosis not present

## 2020-05-02 DIAGNOSIS — M79642 Pain in left hand: Secondary | ICD-10-CM | POA: Diagnosis not present

## 2020-05-02 DIAGNOSIS — M6281 Muscle weakness (generalized): Secondary | ICD-10-CM | POA: Diagnosis not present

## 2020-05-02 DIAGNOSIS — R262 Difficulty in walking, not elsewhere classified: Secondary | ICD-10-CM | POA: Insufficient documentation

## 2020-05-02 DIAGNOSIS — M79641 Pain in right hand: Secondary | ICD-10-CM | POA: Insufficient documentation

## 2020-05-02 NOTE — Therapy (Signed)
[facility] [place], [place], [address] Phone: [phone]   Fax:  [phone]  Physical Therapy Treatment  Patient Details  Name: Sandra Brennan MRN: [id number] Date of Birth: October 13, 1945 Referring Provider (PT): Flossie Buffy, NP   Encounter Date: 05/02/2020   PT End of Session - 05/02/20 6948    Visit Number 5    Number of Visits 12    Date for PT Re-Evaluation 05/24/20    Authorization Type [employer] MCR, MCD secondary, progress note by visit 10, recheck FOTO at visit 6    PT Start Time 0847    PT Stop Time 0928    PT Time Calculation (min) 41 min    Activity Tolerance Patient limited by fatigue;Patient limited by pain    Behavior During Therapy [facility] for tasks assessed/performed           Past Medical History:  Diagnosis Date  . Anemia   . Arthritis    "plenty" (01-Sep-2012)  . Asthma   . Chronic lower back pain    "they say I need a whole new spinal column" (09/01/12)  . COPD (chronic obstructive pulmonary disease) ([place])   . Degenerative arthritis    "all over" (09-01-2012)  . Fx ankle   . GERD (gastroesophageal reflux disease)   . Gout 05/05/2013  . Hypertension   . Migraines    "used to; totally stopped when I quit drinking 30 yr ago" (09-01-2012)  . Myalgia and myositis   . Myocardial infarction [facility]) 1992   2012-09-01 "mild MI when son died "  . Obesity   . Osteoporosis    "all over" (September 01, 2012)  . Shortness of breath    when stressed.  . Sleep apnea    "never did fix my machine; haven't used one for 5 years; I've lost 116# since then; no problems now" (September 01, 2012)  . Type II diabetes mellitus ([place])    "borderline; don't test; take Metformin" (09/01/12)    Past Surgical History:  Procedure Laterality Date  . ABDOMINAL HYSTERECTOMY  1990's  . GANGLION CYST EXCISION Right 1980's   "wrist" (2012-09-01)  . JOINT REPLACEMENT    . KNEE LIGAMENT RECONSTRUCTION Right 1980's  . TONSILLECTOMY  ~ 1954  .  TOTAL HIP ARTHROPLASTY Left September 01, 2012  . TOTAL HIP ARTHROPLASTY Left 2012/09/01   Procedure: TOTAL HIP ARTHROPLASTY;  Surgeon: Garald Balding, MD;  Location: [place];  Service: Orthopedics;  Laterality: Left;  Left Total Hip Arthroplasty  . TOTAL HIP ARTHROPLASTY Left 08/28/2012   Procedure: Irrigation and Debridement hip ;  Surgeon: Mcarthur Rossetti, MD;  Location: [place];  Service: Orthopedics;  Laterality: Left;  . TOTAL KNEE ARTHROPLASTY Left 2001  . TOTAL KNEE ARTHROPLASTY Right 2004    Vitals:   05/02/20 0902  Pulse: 76  SpO2: 96%     Subjective Assessment - 05/02/20 0947    Subjective Pt. reports diffuse pain symptoms including pain in bilat. hands right>left with pain in both arms as well as feet. She rates pain this AM 9/10 in hands and 10/10 in feet. Still no word on scheduling eval with hand therapist so advised to try and contact clinic again to help get hand eval set up. As noted last session pt. reports some initial benefit from heel lift but reports it exacerbated her foot pain so plans to check with MD for possible orthotic referral.    Pertinent History Diabetic, chronic pain, bilat. TKA, left THA, MI, COPD-uses CPAP, right  ankle fx., osteoporosis, obesity    Currently in Pain? Yes    Pain Score --   9-10/10   Pain Location Generalized   hands, arms, feet bilaterally   Pain Descriptors / Indicators Aching   "vibration" in right leg intermittently   Pain Type Chronic pain    Pain Onset More than a month ago    Pain Frequency Constant    Aggravating Factors  activity, prolonged positioning for standing and sitting    Pain Relieving Factors medication    Effect of Pain on Daily Activities limits activity and positional tolerance                             [facility] Adult PT Treatment/Exercise - 05/02/20 0001      Knee/Hip Exercises: Aerobic   Nustep L1 x 4 min LE only      Knee/Hip Exercises: Standing   Heel Raises Both;15 reps    Hip Flexion  Limitations alternating marches on Airex 2x10    Hip Abduction Stengthening;Both;15 reps;Knee straight    Abduction Limitations hand support on counter bilat. UE    Other Standing Knee Exercises Romberg feet apart on Airex with CGA eyes closed 10 sec x 3 at counter      Knee/Hip Exercises: Seated   Long Arc Quad AROM;Strengthening;Right;Left;2 sets;10 reps    Long Arc Quad Weight 3 lbs.    Clamshell with TheraBand Green   x 15 reps   Hamstring Curl AROM;Strengthening;Both;1 set;15 reps    Hamstring Limitations green band      Manual Therapy   Manual therapy comments attempted gentle distraction monilization to right hand MP and PIP joints as well as PA/AP grade I-II but held due to pain and guarding                  PT Education - 05/02/20 0958    Education Details contact clinic for scheduling of OT hand eval, pain science education    Person(s) Educated Patient    Methods Explanation    Comprehension Verbalized understanding            PT Short Term Goals - 04/25/20 1938      PT SHORT TERM GOAL #1   Title Independent with initial/basic HEP    Status On-going      PT SHORT TERM GOAL #2   Title Review pt. FOTO self report by visit 3    Status Achieved      PT SHORT TERM GOAL #3   Title Assess 5 times sit<>stand to establish baseline for improving ability for transfers    Status Achieved             PT Long Term Goals - 04/12/20 1251      PT LONG TERM GOAL #1   Title Improve FOTO outcome measure score to 43% functional status or greater    Baseline 38%    Time 6    Period Weeks    Status New    Target Date 05/24/20      PT LONG TERM GOAL #2   Title Improve Tinetti Gait + Balance score at least 5 points to work towards decreased fall risk    Baseline 11/28    Time 6    Period Weeks    Status New    Target Date 05/24/20      PT LONG TERM GOAL #3   Title Improve bilat. LE strength at  least grossly 1/2 MMT grade to improve ability for transfers from  low chairs and bed mobility for sit<>supine    Baseline see objective    Time 6    Period Weeks    Status New    Target Date 05/24/20      PT LONG TERM GOAL #4   Title 5 times sit<>stand goal to be set pending assessment                 Plan - 05/02/20 0953    Clinical Impression Statement Mobility status and tolerance for therapy session today limited by high pain level as noted in subjective with diffuse pain symptoms with frequent rests required for attemptes standing exercises. Attempted brief gentle manual therapy to (right) hand but held due to pain-plan defer hand tx. to OT pending scheduling of hand therapy eval. Limited progress thus far with gait/mobility with status impacted by medical comorbidities and high pain level/chronic pain history so expect potential progress to be gradual.    Personal Factors and Comorbidities Time since onset of injury/illness/exacerbation;Comorbidity 3+    Comorbidities see PMH    Examination-Activity Limitations Dressing;Bathing;Transfers;Lift;Squat;Locomotion Level;Stairs;Stand;Carry;Bed Mobility    Examination-Participation Restrictions Meal Prep;Shop;Community Activity;Laundry;Cleaning    Stability/Clinical Decision Making Evolving/Moderate complexity    Clinical Decision Making Moderate    Rehab Potential Fair    PT Frequency 2x / week    PT Duration 6 weeks    PT Treatment/Interventions ADLs/Self Care Home Management;Cryotherapy;Electrical Stimulation;Iontophoresis 4mg /ml Dexamethasone;Moist Heat;Therapeutic activities;Gait training;Therapeutic exercise;Patient/family education;Manual techniques;Dry needling;Balance training;Neuromuscular re-education;Functional mobility training;Stair training    PT Next Visit Plan recheck [place] next session, continue/progress as tolerated pending pain with standing strengthening and balance exercises, any word on setting up OT referral. Check Hoffman's vs. inverted supinator sign if bilat. UE symptoms  persist.    Consulted and Agree with Plan of Care Patient           Patient will benefit from skilled therapeutic intervention in order to improve the following deficits and impairments:  Pain,Impaired UE functional use,Decreased strength,Decreased activity tolerance,Abnormal gait,Decreased balance,Impaired sensation,Obesity,Difficulty walking,Decreased endurance  Visit Diagnosis: Other abnormalities of gait and mobility  Muscle weakness (generalized)  Difficulty in walking, not elsewhere classified  Pain in left hand  Pain in right hand     Problem List Patient Active Problem List   Diagnosis Date Noted  . Intertrigo 03/27/2020  . Anemia 08/23/2019  . Polyethylene liner wear following total hip arthroplasty requiring isolated polyethylene liner exchange ([place]) 07/12/2019  . Adjustment disorder with mixed anxiety and depressed mood 07/12/2018  . Migraine without aura and without status migrainosus, not intractable 09/10/2017  . Post-traumatic osteoarthritis of right ankle 06/09/2016  . Spondylosis without myelopathy or radiculopathy, lumbar region 06/09/2016  . Cervical spinal stenosis 06/09/2016  . Spinal stenosis, lumbar region, without neurogenic claudication 06/09/2016  . Chronic gouty arthritis 06/04/2016  . Vitamin D deficiency 06/02/2016  . Hyperlipidemia 06/02/2016  . Closed right ankle fracture 05/01/2016  . Colon polyps 05/01/2016  . Fibromyalgia syndrome 05/01/2016  . Syncope 03/30/2016  . Chest pain 03/30/2016  . Fall 03/30/2016  . GERD with esophagitis 06/21/2015  . Pharyngeal dysphagia 06/21/2015  . Obesity 05/06/2013  . Acute nontraumatic kidney injury ([place]) 05/05/2013  . Acute posthemorrhagic anemia 09/06/2012  . Gout 09/05/2012  . Osteoarthritis of left hip 08/05/2012  . Type 2 diabetes mellitus with diabetic polyneuropathy, without long-term current use of insulin ([place]) 08/05/2012  . Hypertension 08/05/2012  . Asthma, chronic 08/05/2012  . Sleep  apnea 08/05/2012    Beaulah Dinning, PT, DPT 05/02/20 10:00 AM  [facility] [address] [place], [place], [address] Phone: [phone]   Fax:  [phone]  Name: Sandra Brennan MRN: [id number] Date of Birth: 12-16-1945

## 2020-05-08 ENCOUNTER — Other Ambulatory Visit: Payer: Self-pay | Admitting: Nurse Practitioner

## 2020-05-08 ENCOUNTER — Other Ambulatory Visit: Payer: Self-pay

## 2020-05-08 ENCOUNTER — Ambulatory Visit: Payer: Medicare Other

## 2020-05-08 ENCOUNTER — Ambulatory Visit (INDEPENDENT_AMBULATORY_CARE_PROVIDER_SITE_OTHER): Payer: Medicare Other

## 2020-05-08 DIAGNOSIS — M25552 Pain in left hip: Secondary | ICD-10-CM | POA: Diagnosis not present

## 2020-05-08 DIAGNOSIS — W19XXXA Unspecified fall, initial encounter: Secondary | ICD-10-CM | POA: Diagnosis not present

## 2020-05-08 DIAGNOSIS — M533 Sacrococcygeal disorders, not elsewhere classified: Secondary | ICD-10-CM

## 2020-05-08 NOTE — Progress Notes (Signed)
Patient was coming in today for a [employer], [employer] visit and fell outside of the office.  Patient was on her feet when I came outside to assess her. With  assistance I sat her in wheelchair and pushed her into a room to assess her further.  Pt explained that she had pain in her lt hip down to her lt knee.  I did not see any skin breakage or bleeding.  I asked pt what was her pain level on the scale of 1-10.  Pt stated that she was at a 10, as she could not really explain the pain to me, she stated that she just felt pain.  Pt then stated that her lt upper arm/[place] was also in pain.  I took pt's vital signs in documented them in her chart under the appointment that she had with Nurse Jana Half.  I periodically checked on pt while waiting on [name] to come in to evaluate pt.  Pt was very heavy to push into the building, Liane Comber did help me after trying to get the wheelchair over the small ledge in the entry way of the front door, which hurt my lower back some.  Erol Flanagin L Hansika Leaming, CMA

## 2020-05-08 NOTE — Progress Notes (Unsigned)
Subjective:   Sandra Brennan is a 75 y.o. female who presents for Medicare Annual (Subsequent) preventive examination.  I connected with *** today by telephone and verified that I am speaking with the correct person using two identifiers. Location patient: home Location provider: work Persons participating in the virtual visit: patient, Marine scientist.    I discussed the limitations, risks, security and privacy concerns of performing an evaluation and management service by telephone and the availability of in person appointments. I also discussed with the patient that there may be a patient responsible charge related to this service. The patient expressed understanding and verbally consented to this telephonic visit.    Interactive audio and video telecommunications were attempted between this provider and patient, however failed, due to patient having technical difficulties OR patient did not have access to video capability.  We continued and completed visit with audio only.  Some vital signs may be absent or patient reported.   Time Spent with patient on telephone encounter: *** minutes   Review of Systems    ***       Objective:    There were no vitals filed for this visit. There is no height or weight on file to calculate BMI.  Advanced Directives 04/12/2020 05/04/2019 03/07/2019 05/03/2017 04/01/2017 12/08/2016 11/10/2016  Does Patient Have a Medical Advance Directive? No No No No No No No  Type of Advance Directive - - - - - - -  Does patient want to make changes to medical advance directive? - - - - - - -  Copy of [place] in Chart? - - - - - - -  Would patient like information on creating a medical advance directive? Yes (MAU/Ambulatory/Procedural Areas - Information given) No - Patient declined No - Patient declined - Yes (MAU/Ambulatory/Procedural Areas - Information given) - -  Pre-existing out of facility DNR order (yellow form or pink MOST form) - - - - - - -     Current Medications (verified) Outpatient Encounter Medications as of 05/08/2020  Medication Sig  . albuterol (VENTOLIN HFA) 108 (90 Base) MCG/ACT inhaler INHALE 1-2 PUFFS BY MOUTH EVERY 6 HOURS AS NEEDED FOR WHEEZE OR SHORTNESS OF BREATH  . allopurinol (ZYLOPRIM) 100 MG tablet TAKE 1 TABLET BY MOUTH EVERY DAY  . aspirin EC 81 MG tablet Take 81 mg by mouth daily.  Marland Kitchen atorvastatin (LIPITOR) 10 MG tablet TAKE 1 TABLET (10 MG TOTAL) BY MOUTH DAILY AT 6 PM.  . Carboxymethylcellulose Sodium (EYE DROPS OP) Apply to eye. Lubricant eye drops  . cetirizine (ZYRTEC) 10 MG tablet Take 10 mg by mouth daily.  . cycloSPORINE (RESTASIS) 0.05 % ophthalmic emulsion Restasis 0.05 % eye drops in a dropperette  . lisinopril-hydrochlorothiazide (ZESTORETIC) 10-12.5 MG tablet TAKE 1 TABLET BY MOUTH EVERY DAY  . Multiple Vitamin (MULTIVITAMIN) tablet Take 1 tablet by mouth daily.  Marland Kitchen nystatin (MYCOSTATIN/NYSTOP) powder Apply 1 application topically 3 (three) times daily.  Marland Kitchen omeprazole (PRILOSEC) 20 MG capsule TAKE 1 CAPSULE BY MOUTH EVERY DAY  . OVER THE COUNTER MEDICATION Cystic magnesium  . pregabalin (LYRICA) 150 MG capsule Take 1 capsule (150 mg total) by mouth 2 (two) times daily.  . Sennosides 8.6 MG CAPS Take by mouth.  . SYMBICORT 160-4.5 MCG/ACT inhaler INHALE 2 PUFFS INTO THE LUNGS IN THE MORNING AND AT BEDTIME. RINSE MOUTH AFTER EACH USE  . traMADol (ULTRAM) 50 MG tablet Take 1 tablet (50 mg total) by mouth 3 (three) times daily.  Marland Kitchen  Ubrogepant (UBRELVY) 50 MG TABS Take 50 mg by mouth as needed (take 1 at onset of headache, may repeat once 2 hours after initial dosing). (Patient not taking: Reported on 03/26/2020)  . Vitamin D, Ergocalciferol, (DRISDOL) 1.25 MG (50000 UNIT) CAPS capsule TAKE 1 CAPSULE (50,000 UNITS TOTAL) BY MOUTH EVERY 7 (SEVEN) DAYS   No facility-administered encounter medications on file as of 05/08/2020.    Allergies (verified) Cymbalta [duloxetine hcl], Pineapple, Influenza  vaccines, Penicillins, Sulfa antibiotics, and Theophyllines   History: Past Medical History:  Diagnosis Date  . Anemia   . Arthritis    "plenty" (2012-08-20)  . Asthma   . Chronic lower back pain    "they say I need a whole new spinal column" (08-20-12)  . COPD (chronic obstructive pulmonary disease) ([place])   . Degenerative arthritis    "all over" (August 20, 2012)  . Fx ankle   . GERD (gastroesophageal reflux disease)   . Gout 05/05/2013  . Hypertension   . Migraines    "used to; totally stopped when I quit drinking 30 yr ago" (08/20/2012)  . Myalgia and myositis   . Myocardial infarction [facility]) 1992   08/20/2012 "mild MI when son died "  . Obesity   . Osteoporosis    "all over" (08-20-12)  . Shortness of breath    when stressed.  . Sleep apnea    "never did fix my machine; haven't used one for 5 years; I've lost 116# since then; no problems now" (20-Aug-2012)  . Type II diabetes mellitus ([place])    "borderline; don't test; take Metformin" (Aug 20, 2012)   Past Surgical History:  Procedure Laterality Date  . ABDOMINAL HYSTERECTOMY  1990's  . GANGLION CYST EXCISION Right 1980's   "wrist" (Aug 20, 2012)  . JOINT REPLACEMENT    . KNEE LIGAMENT RECONSTRUCTION Right 1980's  . TONSILLECTOMY  ~ 1954  . TOTAL HIP ARTHROPLASTY Left 08-20-2012  . TOTAL HIP ARTHROPLASTY Left 08/20/2012   Procedure: TOTAL HIP ARTHROPLASTY;  Surgeon: Garald Balding, MD;  Location: [facility];  Service: Orthopedics;  Laterality: Left;  Left Total Hip Arthroplasty  . TOTAL HIP ARTHROPLASTY Left 08/28/2012   Procedure: Irrigation and Debridement hip ;  Surgeon: Mcarthur Rossetti, MD;  Location: [facility];  Service: Orthopedics;  Laterality: Left;  . TOTAL KNEE ARTHROPLASTY Left 2001  . TOTAL KNEE ARTHROPLASTY Right 2004   Family History  Problem Relation Age of Onset  . Arthritis Mother   . Arthritis Father   . Colon cancer Father   . Diabetes Sister   . Arthritis Sister   . Diabetes Maternal Grandmother   . Arthritis Maternal  Grandmother   . Diabetes Maternal Grandfather   . Arthritis Maternal Grandfather    Social History   Socioeconomic History  . Marital status: Widowed    Spouse name: Not on file  . Number of children: 5  . Years of education: PhD  . Highest education level: Not on file  Occupational History  . Occupation: Retired  Tobacco Use  . Smoking status: Former Smoker    Packs/day: 0.12    Years: 2.00    Pack years: 0.24    Types: Cigarettes  . Smokeless tobacco: Never Used  . Tobacco comment: 08/20/2012 "quit 30 years ago"  Vaping Use  . Vaping Use: Never used  Substance and Sexual Activity  . Alcohol use: No    Alcohol/week: 0.0 standard drinks    Comment: 08/20/12 "used to be a beeralcoholic"; stopped ~ 30 yr ago"  .  Drug use: No  . Sexual activity: Never  Other Topics Concern  . Not on file  Social History Narrative   Lives at home with her mother and caregiver.   Occasional use of caffeine.   Right-handed.   Social Determinants of Health   Financial Resource Strain: Not on file  Food Insecurity: Not on file  Transportation Needs: Not on file  Physical Activity: Not on file  Stress: Not on file  Social Connections: Not on file    Tobacco Counseling Counseling given: Not Answered Comment: 08/03/2012 "quit 30 years ago"   Clinical Intake:                 Diabetes:  Is the patient diabetic?  Yes  If diabetic, was a CBG obtained today?  No  Did the patient bring in their glucometer from home?  No phone visit How often do you monitor your CBG's? ***.   Financial Strains and Diabetes Management:  Are you having any financial strains with the device, your supplies or your medication? {YES/NO:21197}.  Does the patient want to be seen by Chronic Care Management for management of their diabetes?  {YES/NO:21197} Would the patient like to be referred to a Nutritionist or for Diabetic Management?  {YES/NO:21197}  Diabetic Exams:  Diabetic Eye Exam: Completed  ***. Overdue for diabetic eye exam. Pt has been advised about the importance in completing this exam. A referral has been placed today. Message sent to referral coordinator for scheduling purposes. Advised pt to expect a call from our office re: appt.  Diabetic Foot Exam: Completed 08/23/2019          Activities of Daily Living No flowsheet data found.  Patient Care Team: Nche, Charlene Brooke, NP as PCP - General (Internal Medicine) Garald Balding, MD as Consulting Physician (Orthopedic Surgery)  Indicate any recent Medical Services you may have received from other than [facility] providers in the past year (date may be approximate).     Assessment:   This is a routine wellness examination for [place].  Hearing/Vision screen No exam data present  Dietary issues and exercise activities discussed:    Goals    . Eat healthier      Depression Screen PHQ 2/9 Scores 08/23/2019 05/04/2019 07/12/2018 04/09/2018 04/09/2018 04/01/2017 02/26/2017  PHQ - 2 Score 0 0 1 3 0 0 0  PHQ- 9 Score - - 8 10 - - -    Fall Risk Fall Risk  03/26/2020 02/02/2020 08/23/2019 08/02/2019 05/04/2019  Falls in the past year? 1 1 0 1 0  Comment - - - - -  Number falls in past yr: 0 1 0 0 0  Injury with Fall? 1 0 0 0 0  Comment - - - - -  Risk Factor Category  - - - - -  Risk for fall due to : History of fall(s) - - - -  Risk for fall due to: Comment - - - - -  Follow up - - - - Education provided;Falls prevention discussed    FALL RISK PREVENTION PERTAINING TO THE HOME:  Any stairs in or around the home? {YES/NO:21197} If so, are there any without handrails? {YES/NO:21197} Home free of loose throw rugs in walkways, pet beds, electrical cords, etc? {YES/NO:21197} Adequate lighting in your home to reduce risk of falls? {YES/NO:21197}  ASSISTIVE DEVICES UTILIZED TO PREVENT FALLS:  Life alert? {YES/NO:21197} Use of a cane, walker or w/c? {YES/NO:21197} Grab bars in the bathroom? {YES/NO:21197} Shower chair or  bench in shower? {YES/NO:21197} Elevated toilet seat or a handicapped toilet? {YES/NO:21197}  TIMED UP AND GO:  Was the test performed? {YES/NO:21197}.  Length of time to ambulate 10 feet: *** sec.   {Appearance of Gait:2101803}  Cognitive Function: MMSE - Mini Mental State Exam 04/01/2017  Orientation to time 5  Orientation to Place 5  Registration 3  Attention/ Calculation 5  Recall 1  Language- name 2 objects 2  Language- repeat 1  Language- follow 3 step command 3  Language- read & follow direction 1  Write a sentence 1  Copy design 1  Total score 28        Immunizations Immunization History  Administered Date(s) Administered  . Moderna Sars-Covid-2 Vaccination 05/09/2019, 06/10/2019  . Pneumococcal Conjugate-13 06/02/2016  . Pneumococcal Polysaccharide-23 08/31/2017    TDAP status: Due, Education has been provided regarding the importance of this vaccine. Advised may receive this vaccine at local pharmacy or Health Dept. Aware to provide a copy of the vaccination record if obtained from local pharmacy or Health Dept. Verbalized acceptance and understanding.  Flu vaccine status: Patient is allergic  Pneumococcal vaccine status: Up to date  {Covid-19 vaccine status:2101808}  Qualifies for Shingles Vaccine? Yes   Zostavax completed No   Shingrix Completed?: No.    Education has been provided regarding the importance of this vaccine. Patient has been advised to call insurance company to determine out of pocket expense if they have not yet received this vaccine. Advised may also receive vaccine at local pharmacy or Health Dept. Verbalized acceptance and understanding.  Screening Tests Health Maintenance  Topic Date Due  . OPHTHALMOLOGY EXAM  05/12/2019  . COVID-19 Vaccine (3 - Booster for Moderna series) 12/10/2019  . TETANUS/TDAP  05/22/2020 (Originally 06/07/1964)  . FOOT EXAM  08/22/2020  . HEMOGLOBIN A1C  09/23/2020  . MAMMOGRAM  03/17/2021  . COLONOSCOPY  (Pts 45-7yrs Insurance coverage will need to be confirmed)  01/01/2022  . DEXA SCAN  Completed  . Hepatitis C Screening  Completed  . PNA vac Low Risk Adult  Completed  . HPV VACCINES  Aged Out    Health Maintenance  Health Maintenance Due  Topic Date Due  . OPHTHALMOLOGY EXAM  05/12/2019  . COVID-19 Vaccine (3 - Booster for Moderna series) 12/10/2019    Colorectal cancer screening: Type of screening: Colonoscopy. Completed 01/02/2012. Repeat every 10 years  {Mammogram status:21018020}  Bone Density status: Completed 03/18/2019. Results reflect: Bone density results: NORMAL. Repeat every 2 years.  Lung Cancer Screening: (Low Dose CT Chest recommended if Age 8-80 years, 30 pack-year currently smoking OR have quit w/in 15years.) does not qualify.    Additional Screening:  Hepatitis C Screening:Completed 06/02/2016  Vision Screening: Recommended annual ophthalmology exams for early detection of glaucoma and other disorders of the eye. Is the patient up to date with their annual eye exam?  {YES/NO:21197} Who is the provider or what is the name of the office in which the patient attends annual eye exams? *** If pt is not established with a provider, would they like to be referred to a provider to establish care? {YES/NO:21197}.   Dental Screening: Recommended annual dental exams for proper oral hygiene  Community Resource Referral / Chronic Care Management: CRR required this visit?  {YES/NO:21197}  CCM required this visit?  {YES/NO:21197}     Plan:     I have personally reviewed and noted the following in the patient's chart:   . Medical and social history . Use of alcohol,  tobacco or illicit drugs  . Current medications and supplements . Functional ability and status . Nutritional status . Physical activity . Advanced directives . List of other physicians . Hospitalizations, surgeries, and ER visits in previous 12 months . Vitals . Screenings to include cognitive,  depression, and falls . Referrals and appointments  In addition, I have reviewed and discussed with patient certain preventive protocols, quality metrics, and best practice recommendations. A written personalized care plan for preventive services as well as general preventive health recommendations were provided to patient.   Due to this being a telephonic visit, the after visit summary with patients personalized plan was offered to patient via mail or my-chart. ***Patient declined at this time./ Patient would like to access on my-chart/ per request, patient was mailed a copy of AVS./ Patient preferred to pick up at office at next visit.    Marta Antu, LPN   05/02/7612  Nurse Health Advisor  Nurse Notes: ***

## 2020-05-08 NOTE — Progress Notes (Signed)
Accompanied by her daughter. Sandra. Sandra Brennan fell in parking lot today. Daughter states she missed a step and fell on her left hip.  Sandra Brennan reports left hip pain, no chest wall pain, no back pain, no head injury. She is alert and oriented. She is able to stand with her cane but has pain with weight bearing on left leg. No knee pain, no foot pain. BP 140/78, HR 77, O2 Sat. 95%RA, Temp. 98.2

## 2020-05-09 ENCOUNTER — Encounter: Payer: Self-pay | Admitting: Physical Therapy

## 2020-05-09 ENCOUNTER — Ambulatory Visit: Payer: Medicare Other | Admitting: Physical Therapy

## 2020-05-09 ENCOUNTER — Other Ambulatory Visit: Payer: Self-pay

## 2020-05-09 DIAGNOSIS — R2689 Other abnormalities of gait and mobility: Secondary | ICD-10-CM

## 2020-05-09 DIAGNOSIS — R262 Difficulty in walking, not elsewhere classified: Secondary | ICD-10-CM

## 2020-05-09 DIAGNOSIS — M79641 Pain in right hand: Secondary | ICD-10-CM | POA: Diagnosis not present

## 2020-05-09 DIAGNOSIS — M6281 Muscle weakness (generalized): Secondary | ICD-10-CM | POA: Diagnosis not present

## 2020-05-09 DIAGNOSIS — M79642 Pain in left hand: Secondary | ICD-10-CM | POA: Diagnosis not present

## 2020-05-09 NOTE — Therapy (Signed)
[place], [place], [address] Phone: [phone]   Fax:  [phone]  Physical Therapy Treatment  Patient Details  Name: Sandra Brennan MRN: [id number] Date of Birth: March 25, 1945 Referring Provider (PT): Flossie Buffy, NP   Encounter Date: 05/09/2020   PT End of Session - 05/09/20 0934    Visit Number 6    Number of Visits 12    Date for PT Re-Evaluation 05/24/20    Authorization Type UHC MCR, MCD secondary, progress note by visit 10    Progress Note Due on Visit 10    PT Start Time 0920    PT Stop Time 1000    PT Time Calculation (min) 40 min    Activity Tolerance Patient limited by pain    Behavior During Therapy [facility] for tasks assessed/performed           Past Medical History:  Diagnosis Date  . Anemia   . Arthritis    "plenty" (08-13-12)  . Asthma   . Chronic lower back pain    "they say I need a whole new spinal column" (08-13-2012)  . COPD (chronic obstructive pulmonary disease) ([place])   . Degenerative arthritis    "all over" (Aug 13, 2012)  . Fx ankle   . GERD (gastroesophageal reflux disease)   . Gout 05/05/2013  . Hypertension   . Migraines    "used to; totally stopped when I quit drinking 30 yr ago" (13-Aug-2012)  . Myalgia and myositis   . Myocardial infarction [facility]) 1992   08-13-12 "mild MI when son died "  . Obesity   . Osteoporosis    "all over" (2012-08-13)  . Shortness of breath    when stressed.  . Sleep apnea    "never did fix my machine; haven't used one for 5 years; I've lost 116# since then; no problems now" (08/13/2012)  . Type II diabetes mellitus ([employer])    "borderline; don't test; take Metformin" (2012-08-13)    Past Surgical History:  Procedure Laterality Date  . ABDOMINAL HYSTERECTOMY  1990's  . GANGLION CYST EXCISION Right 1980's   "wrist" (Aug 13, 2012)  . JOINT REPLACEMENT    . KNEE LIGAMENT RECONSTRUCTION Right 1980's  . TONSILLECTOMY  ~ 1954  . TOTAL HIP  ARTHROPLASTY Left 08-13-2012  . TOTAL HIP ARTHROPLASTY Left 2012/08/13   Procedure: TOTAL HIP ARTHROPLASTY;  Surgeon: Garald Balding, MD;  Location: [place];  Service: Orthopedics;  Laterality: Left;  Left Total Hip Arthroplasty  . TOTAL HIP ARTHROPLASTY Left 08/28/2012   Procedure: Irrigation and Debridement hip ;  Surgeon: Mcarthur Rossetti, MD;  Location: [place];  Service: Orthopedics;  Laterality: Left;  . TOTAL KNEE ARTHROPLASTY Left 2001  . TOTAL KNEE ARTHROPLASTY Right 2004    There were no vitals filed for this visit.   Subjective Assessment - 05/09/20 0926    Subjective Patient report she fell while going to see her NP yesterday, she isn't sure what side she fell on but her daughter thought she fell on her left side. Patient had x-rays for left hip region that were negative. Patient reports currently she is having pain on the right side so may call her doctor again. Patient has still not scheduled OT but states hands feel better while using putty to squeeze 2-3x/day.    Diagnostic tests Recent x-ray negative for hip/sacral fracture    Patient Stated Goals Improve walking and decrease pain    Currently in Pain? Yes  Pain Score 10-Worst pain ever   patient reports '11"   Pain Location Generalized   majority in bilateral hips and right leg   Pain Orientation Right;Left    Pain Descriptors / Indicators Aching;Throbbing   also repors vibrating on right leg   Pain Type Chronic pain;Acute pain   acute worsening of pain from fall yesterday   Pain Onset More than a month ago    Pain Frequency Constant              [facility] PT Assessment - 05/09/20 0001      Ambulation/Gait   Ambulation/Gait Yes    Ambulation/Gait Assistance 5: Supervision    Ambulation/Gait Assistance Details Patient using SPC on right, supervision required for significant reliance on cane with ambulation and unsteadiness, decreased gait speed, wide BOS, antalgic gait mainly on right    Assistive device Straight  cane   hurrycane   Gait Comments Patient required extra time to ambulate in and out of therapy gym from clinic lobby                         [facility] Adult PT Treatment/Exercise - 05/09/20 0001      Knee/Hip Exercises: Aerobic   Nustep L2 x 10 min with UE and LE      Knee/Hip Exercises: Seated   Long Arc Quad 2 sets;10 reps    Other Seated Knee/Hip Exercises Heel-toe raises 2 x 10      Knee/Hip Exercises: Supine   Other Supine Knee/Hip Exercises Marching 2 x 10    Other Supine Knee/Hip Exercises Bent knee fall out 2 x 10, clamshell with yellow 2 x 10                  PT Education - 05/09/20 0930    Education Details follow-up with NP for right sided pain and to schedule an appointment, scheduling OT hand evaluation    Person(s) Educated Patient    Methods Explanation    Comprehension Verbalized understanding            PT Short Term Goals - 04/25/20 1938      PT SHORT TERM GOAL #1   Title Independent with initial/basic HEP    Status On-going      PT SHORT TERM GOAL #2   Title Review pt. FOTO self report by visit 3    Status Achieved      PT SHORT TERM GOAL #3   Title Assess 5 times sit<>stand to establish baseline for improving ability for transfers    Status Achieved             PT Long Term Goals - 04/12/20 1251      PT LONG TERM GOAL #1   Title Improve FOTO outcome measure score to 43% functional status or greater    Baseline 38%    Time 6    Period Weeks    Status New    Target Date 05/24/20      PT LONG TERM GOAL #2   Title Improve Tinetti Gait + Balance score at least 5 points to work towards decreased fall risk    Baseline 11/28    Time 6    Period Weeks    Status New    Target Date 05/24/20      PT LONG TERM GOAL #3   Title Improve bilat. LE strength at least grossly 1/2 MMT grade to improve ability for transfers from low  chairs and bed mobility for sit<>supine    Baseline see objective    Time 6    Period Weeks     Status New    Target Date 05/24/20      PT LONG TERM GOAL #4   Title 5 times sit<>stand goal to be set pending assessment                 Plan - 05/09/20 0937    Clinical Impression Statement Patient limited by increased pain this visit secondary to fall yesterday. Patient had x-rays on left side yesterday that were negative but reports worsening right hip/leg pain this visit so instructed patient to contact her doctor regarding this pain to determine whether she needs further imagining. Therapy consisted of majority exercise for active motion for LEs as tolerated. Patient instructed to avoid aggavating exercises at home so modify to performing exercises in seated or supine position until pain improves, but continue to try to take short walks to maintain mobility. FOTO deferred this visit due to time constraint and patient's increased pain and decreased mobility secondary to fall. Patient would benefit from continued skilled PT to progress mobility and strength to improve activity tolerance and maximize functional ability.    PT Treatment/Interventions ADLs/Self Care Home Management;Cryotherapy;Electrical Stimulation;Iontophoresis 4mg /ml Dexamethasone;Moist Heat;Therapeutic activities;Gait training;Therapeutic exercise;Patient/family education;Manual techniques;Dry needling;Balance training;Neuromuscular re-education;Functional mobility training;Stair training    PT Next Visit Plan recheck [place] next session, continue/progress as tolerated pending pain with standing strengthening and balance exercises, any word on setting up OT referral. Check Hoffman's vs. inverted supinator sign if bilat. UE symptoms persist.    PT Home Exercise Plan [id number] - patient instructed to modify HEP to perform exercises mainly sitting or supine until improved pain with standing    Consulted and Agree with Plan of Care Patient           Patient will benefit from skilled therapeutic intervention in order to  improve the following deficits and impairments:  Pain,Impaired UE functional use,Decreased strength,Decreased activity tolerance,Abnormal gait,Decreased balance,Impaired sensation,Obesity,Difficulty walking,Decreased endurance  Visit Diagnosis: Other abnormalities of gait and mobility  Muscle weakness (generalized)  Difficulty in walking, not elsewhere classified  Pain in left hand  Pain in right hand     Problem List Patient Active Problem List   Diagnosis Date Noted  . Intertrigo 03/27/2020  . Anemia 08/23/2019  . Polyethylene liner wear following total hip arthroplasty requiring isolated polyethylene liner exchange ([place]) 07/12/2019  . Adjustment disorder with mixed anxiety and depressed mood 07/12/2018  . Migraine without aura and without status migrainosus, not intractable 09/10/2017  . Post-traumatic osteoarthritis of right ankle 06/09/2016  . Spondylosis without myelopathy or radiculopathy, lumbar region 06/09/2016  . Cervical spinal stenosis 06/09/2016  . Spinal stenosis, lumbar region, without neurogenic claudication 06/09/2016  . Chronic gouty arthritis 06/04/2016  . Vitamin D deficiency 06/02/2016  . Hyperlipidemia 06/02/2016  . Closed right ankle fracture 05/01/2016  . Colon polyps 05/01/2016  . Fibromyalgia syndrome 05/01/2016  . Syncope 03/30/2016  . Chest pain 03/30/2016  . Fall 03/30/2016  . GERD with esophagitis 06/21/2015  . Pharyngeal dysphagia 06/21/2015  . Obesity 05/06/2013  . Acute nontraumatic kidney injury ([place]) 05/05/2013  . Acute posthemorrhagic anemia 09/06/2012  . Gout 09/05/2012  . Osteoarthritis of left hip 08/05/2012  . Type 2 diabetes mellitus with diabetic polyneuropathy, without long-term current use of insulin ([place]) 08/05/2012  . Hypertension 08/05/2012  . Asthma, chronic 08/05/2012  . Sleep apnea 08/05/2012    Hilda Blades, PT,  DPT, LAT, ATC 05/09/20  10:15 AM Phone: [phone] Fax: [facility] [address] [place], [place], [address] Phone: [phone]   Fax:  [phone]  Name: Sandra Brennan MRN: [id number] Date of Birth: 1945/08/10

## 2020-05-15 ENCOUNTER — Encounter: Payer: Self-pay | Admitting: Physical Therapy

## 2020-05-15 ENCOUNTER — Other Ambulatory Visit: Payer: Self-pay

## 2020-05-15 ENCOUNTER — Other Ambulatory Visit: Payer: Self-pay | Admitting: Nurse Practitioner

## 2020-05-15 ENCOUNTER — Ambulatory Visit: Payer: Medicare Other | Admitting: Physical Therapy

## 2020-05-15 DIAGNOSIS — R2689 Other abnormalities of gait and mobility: Secondary | ICD-10-CM | POA: Diagnosis not present

## 2020-05-15 DIAGNOSIS — M79641 Pain in right hand: Secondary | ICD-10-CM

## 2020-05-15 DIAGNOSIS — R262 Difficulty in walking, not elsewhere classified: Secondary | ICD-10-CM | POA: Diagnosis not present

## 2020-05-15 DIAGNOSIS — J4520 Mild intermittent asthma, uncomplicated: Secondary | ICD-10-CM

## 2020-05-15 DIAGNOSIS — M79642 Pain in left hand: Secondary | ICD-10-CM

## 2020-05-15 DIAGNOSIS — M6281 Muscle weakness (generalized): Secondary | ICD-10-CM | POA: Diagnosis not present

## 2020-05-15 NOTE — Therapy (Signed)
[place], [place], [address] Phone: [phone]   Fax:  [phone]  Physical Therapy Treatment  Patient Details  Name: Sandra Brennan MRN: [id number] Date of Birth: 1945/08/25 Referring Provider (PT): Flossie Buffy, NP   Encounter Date: 05/15/2020   PT End of Session - 05/15/20 0935    Visit Number 7    Number of Visits 12    Date for PT Re-Evaluation 05/24/20    Authorization Type UHC MCR, MCD secondary, progress note by visit 10    Progress Note Due on Visit 10    PT Start Time 0922    PT Stop Time 1002    PT Time Calculation (min) 40 min    Activity Tolerance Patient limited by pain    Behavior During Therapy [facility] for tasks assessed/performed           Past Medical History:  Diagnosis Date  . Anemia   . Arthritis    "plenty" (2012-09-02)  . Asthma   . Chronic lower back pain    "they say I need a whole new spinal column" (Sep 02, 2012)  . COPD (chronic obstructive pulmonary disease) ([place])   . Degenerative arthritis    "all over" (September 02, 2012)  . Fx ankle   . GERD (gastroesophageal reflux disease)   . Gout 05/05/2013  . Hypertension   . Migraines    "used to; totally stopped when I quit drinking 30 yr ago" (September 02, 2012)  . Myalgia and myositis   . Myocardial infarction [facility]) 1992   2012/09/02 "mild MI when son died "  . Obesity   . Osteoporosis    "all over" (Sep 02, 2012)  . Shortness of breath    when stressed.  . Sleep apnea    "never did fix my machine; haven't used one for 5 years; I've lost 116# since then; no problems now" (09/02/12)  . Type II diabetes mellitus ([place])    "borderline; don't test; take Metformin" (09/02/2012)    Past Surgical History:  Procedure Laterality Date  . ABDOMINAL HYSTERECTOMY  1990's  . GANGLION CYST EXCISION Right 1980's   "wrist" (September 02, 2012)  . JOINT REPLACEMENT    . KNEE LIGAMENT RECONSTRUCTION Right 1980's  . TONSILLECTOMY  ~ 1954  . TOTAL HIP  ARTHROPLASTY Left Sep 02, 2012  . TOTAL HIP ARTHROPLASTY Left 09/02/12   Procedure: TOTAL HIP ARTHROPLASTY;  Surgeon: Garald Balding, MD;  Location: [place];  Service: Orthopedics;  Laterality: Left;  Left Total Hip Arthroplasty  . TOTAL HIP ARTHROPLASTY Left 08/28/2012   Procedure: Irrigation and Debridement hip ;  Surgeon: Mcarthur Rossetti, MD;  Location: [place];  Service: Orthopedics;  Laterality: Left;  . TOTAL KNEE ARTHROPLASTY Left 2001  . TOTAL KNEE ARTHROPLASTY Right 2004    There were no vitals filed for this visit.   Subjective Assessment - 05/15/20 0929    Subjective Patient reports 2 more falls since last visit, she fell while coming in from the mailbox at work, then she fell at home while walking toward the kitchen. Patient was able to get herself up both times because she had things to pull up on. Patient reports she feels something is going on with the right left, feels like it is vibrating and then gets really stiff and she can't move it. She reports hands are still doing pretty good using the putty, but she has not scheduled her hand specialist evaluation.    Patient Stated Goals Improve walking and decrease pain  Currently in Pain? Yes    Pain Score 9     Pain Location Generalized   mostly in both arms and shoulders, and right leg   Pain Orientation Right;Left    Pain Descriptors / Indicators Aching;Throbbing;Tightness   vibrating   Pain Type Chronic pain    Pain Onset More than a month ago    Pain Frequency Constant              [facility] PT Assessment - 05/15/20 0001      Assessment   Medical Diagnosis Unsteady gait, hand OA    Referring Provider (PT) Flossie Buffy, NP    Next MD Visit Not scheduled      Precautions   Precautions Fall      Restrictions   Weight Bearing Restrictions No      Balance Screen   Has the patient fallen in the past 6 months Yes    How many times? patient reports three falls recently, two since last PT visit    Has the  patient had a decrease in activity level because of a fear of falling?  Yes    Is the patient reluctant to leave their home because of a fear of falling?  Yes      Observation/Other Assessments   Focus on Therapeutic Outcomes (FOTO)  45% functional status                         [facility] Adult PT Treatment/Exercise - 05/15/20 0001      Self-Care   Self-Care Other Self-Care Comments    Other Self-Care Comments  FOTO, see patient education      Exercises   Exercises Knee/Hip;Shoulder      Knee/Hip Exercises: Aerobic   Nustep L3 x 8 min with LE/UE      Knee/Hip Exercises: Seated   Long Arc Quad 2 sets;10 reps    Long Arc Quad Weight 2 lbs.    Other Seated Knee/Hip Exercises Heel-toe raises 2 x 10      Knee/Hip Exercises: Supine   Bridges Limitations gluteal set    Bridges with Diona Foley Squeeze 10 reps   3 sec hold   Other Supine Knee/Hip Exercises Marching 2 x 10    Other Supine Knee/Hip Exercises Clamshell with yellow 2 x 10      Shoulder Exercises: Supine   Flexion 10 reps   2 sets   Flexion Limitations dowel                  PT Education - 05/15/20 0933    Education Details Patient highly encouraged to follow-up with doctor due to recent falls, scheduling OT hand evaluation    Person(s) Educated Patient    Methods Explanation    Comprehension Verbalized understanding;Need further instruction            PT Short Term Goals - 04/25/20 1938      PT SHORT TERM GOAL #1   Title Independent with initial/basic HEP    Status On-going      PT SHORT TERM GOAL #2   Title Review pt. FOTO self report by visit 3    Status Achieved      PT SHORT TERM GOAL #3   Title Assess 5 times sit<>stand to establish baseline for improving ability for transfers    Status Achieved             PT Long Term Goals - 05/15/20 1009  PT LONG TERM GOAL #1   Title Improve FOTO outcome measure score to 43% functional status or greater    Baseline 45% on 05/15/20     Time 6    Period Weeks    Status Achieved      PT LONG TERM GOAL #2   Title Improve Tinetti Gait + Balance score at least 5 points to work towards decreased fall risk    Baseline 11/28    Time 6    Period Weeks    Status New      PT LONG TERM GOAL #3   Title Improve bilat. LE strength at least grossly 1/2 MMT grade to improve ability for transfers from low chairs and bed mobility for sit<>supine    Baseline see objective    Time 6    Period Weeks    Status New      PT LONG TERM GOAL #4   Title 5 times sit<>stand goal to be set pending assessment                 Plan - 05/15/20 0935    Clinical Impression Statement Patient continues to report high levels of pain that limit progress in PT and recurrent falls. Therapy focused on continue mat based strengthening and mobility exercises exercises with moderate tolerance, but she does require extended rest breaks. Patient instructed to avoid aggavating exercises at home so modify to performing exercises in seated or supine position until pain improves, but continue to try to take short walks to maintain mobility. Patient also highly encouraged to follow-up with doctor soon. Patien does report improvementin functional status via FOTO, but continues to seem severely limited with mobility. Patient would benefit from continued skilled PT to progress mobility and strength to improve activity tolerance and maximize functional ability.    PT Treatment/Interventions ADLs/Self Care Home Management;Cryotherapy;Electrical Stimulation;Iontophoresis 4mg /ml Dexamethasone;Moist Heat;Therapeutic activities;Gait training;Therapeutic exercise;Patient/family education;Manual techniques;Dry needling;Balance training;Neuromuscular re-education;Functional mobility training;Stair training    PT Next Visit Plan Continue/progress as tolerated pending pain with standing strengthening and balance exercises, any word on setting up OT referral. Check Hoffman's vs.  inverted supinator sign if bilat. UE symptoms persist.    PT Home Exercise Plan [id number] - patient instructed to modify HEP to perform exercises mainly sitting or supine until improved pain with standing    Consulted and Agree with Plan of Care Patient           Patient will benefit from skilled therapeutic intervention in order to improve the following deficits and impairments:  Pain,Impaired UE functional use,Decreased strength,Decreased activity tolerance,Abnormal gait,Decreased balance,Impaired sensation,Obesity,Difficulty walking,Decreased endurance  Visit Diagnosis: Other abnormalities of gait and mobility  Muscle weakness (generalized)  Difficulty in walking, not elsewhere classified  Pain in left hand  Pain in right hand     Problem List Patient Active Problem List   Diagnosis Date Noted  . Intertrigo 03/27/2020  . Anemia 08/23/2019  . Polyethylene liner wear following total hip arthroplasty requiring isolated polyethylene liner exchange ([place]) 07/12/2019  . Adjustment disorder with mixed anxiety and depressed mood 07/12/2018  . Migraine without aura and without status migrainosus, not intractable 09/10/2017  . Post-traumatic osteoarthritis of right ankle 06/09/2016  . Spondylosis without myelopathy or radiculopathy, lumbar region 06/09/2016  . Cervical spinal stenosis 06/09/2016  . Spinal stenosis, lumbar region, without neurogenic claudication 06/09/2016  . Chronic gouty arthritis 06/04/2016  . Vitamin D deficiency 06/02/2016  . Hyperlipidemia 06/02/2016  . Closed right ankle fracture 05/01/2016  . Colon polyps  05/01/2016  . Fibromyalgia syndrome 05/01/2016  . Syncope 03/30/2016  . Chest pain 03/30/2016  . Fall 03/30/2016  . GERD with esophagitis 06/21/2015  . Pharyngeal dysphagia 06/21/2015  . Obesity 05/06/2013  . Acute nontraumatic kidney injury ([place]) 05/05/2013  . Acute posthemorrhagic anemia 09/06/2012  . Gout 09/05/2012  . Osteoarthritis of left hip  08/05/2012  . Type 2 diabetes mellitus with diabetic polyneuropathy, without long-term current use of insulin ([place]) 08/05/2012  . Hypertension 08/05/2012  . Asthma, chronic 08/05/2012  . Sleep apnea 08/05/2012    Hilda Blades, PT, DPT, LAT, ATC 05/15/20  10:10 AM Phone: [phone] Fax: [facility] [address] [place], [place], [address] Phone: [phone]   Fax:  [phone]  Name: Sandra Brennan MRN: [id number] Date of Birth: May 27, 1945

## 2020-05-17 ENCOUNTER — Telehealth: Payer: Self-pay | Admitting: Physical Therapy

## 2020-05-17 ENCOUNTER — Other Ambulatory Visit: Payer: Self-pay

## 2020-05-17 ENCOUNTER — Encounter: Payer: Self-pay | Admitting: Physical Therapy

## 2020-05-17 ENCOUNTER — Ambulatory Visit: Payer: Medicare Other | Admitting: Physical Therapy

## 2020-05-17 VITALS — BP 159/83 | HR 89

## 2020-05-17 DIAGNOSIS — M79642 Pain in left hand: Secondary | ICD-10-CM

## 2020-05-17 DIAGNOSIS — M79641 Pain in right hand: Secondary | ICD-10-CM | POA: Diagnosis not present

## 2020-05-17 DIAGNOSIS — R2689 Other abnormalities of gait and mobility: Secondary | ICD-10-CM

## 2020-05-17 DIAGNOSIS — M6281 Muscle weakness (generalized): Secondary | ICD-10-CM | POA: Diagnosis not present

## 2020-05-17 DIAGNOSIS — R262 Difficulty in walking, not elsewhere classified: Secondary | ICD-10-CM

## 2020-05-17 NOTE — Telephone Encounter (Signed)
Attempted to call patient to relay instructions received from Flossie Buffy, NP regarding follow up with her versus orthopedist given recent falls. Left voicemail regarding appointment instructions.

## 2020-05-22 ENCOUNTER — Ambulatory Visit: Payer: Medicare Other | Admitting: Physical Therapy

## 2020-05-23 ENCOUNTER — Ambulatory Visit (INDEPENDENT_AMBULATORY_CARE_PROVIDER_SITE_OTHER): Payer: Medicare Other | Admitting: Nurse Practitioner

## 2020-05-23 ENCOUNTER — Other Ambulatory Visit: Payer: Medicare Other

## 2020-05-23 ENCOUNTER — Encounter: Payer: Self-pay | Admitting: Nurse Practitioner

## 2020-05-23 ENCOUNTER — Ambulatory Visit (INDEPENDENT_AMBULATORY_CARE_PROVIDER_SITE_OTHER): Payer: Medicare Other

## 2020-05-23 ENCOUNTER — Other Ambulatory Visit: Payer: Self-pay

## 2020-05-23 VITALS — BP 146/86 | HR 78 | Temp 97.8°F | Ht 59.0 in | Wt 291.8 lb

## 2020-05-23 DIAGNOSIS — M25512 Pain in left shoulder: Secondary | ICD-10-CM | POA: Diagnosis not present

## 2020-05-23 DIAGNOSIS — R079 Chest pain, unspecified: Secondary | ICD-10-CM | POA: Diagnosis not present

## 2020-05-23 DIAGNOSIS — R0789 Other chest pain: Secondary | ICD-10-CM

## 2020-05-23 DIAGNOSIS — M150 Primary generalized (osteo)arthritis: Secondary | ICD-10-CM | POA: Diagnosis not present

## 2020-05-23 MED ORDER — NAPROXEN 500 MG PO TABS: 500.0000 mg | ORAL_TABLET | Freq: Two times a day (BID) | ORAL | 0 refills | Status: DC

## 2020-05-23 NOTE — Progress Notes (Signed)
Subjective:  Patient ID: Sandra Brennan, female    DOB: 21-Oct-1945  Age: 75 y.o. MRN: [id number]  CC: Acute Visit (Pt states she has fell 5x in the past 2-3 weeks. Pt states her right leg has a tingling sensation that travels up to her hip and this is when she looses balance and falls. Pt states she feels as if she does not have anything to hold onto she will fall due to balance issues. )  Chest Pain  This is a new problem. The current episode started in the past 7 days. The onset quality is gradual. The problem occurs intermittently. The problem has been waxing and waning. The pain is present in the substernal region. The pain is moderate. The quality of the pain is described as tightness and sharp. The pain does not radiate. Associated symptoms include exertional chest pressure, malaise/fatigue, shortness of breath and weakness. Pertinent negatives include no dizziness, fever, headaches, irregular heartbeat, lower extremity edema, nausea, numbness or PND. The pain is aggravated by walking. She has tried rest for the symptoms. The treatment provided significant relief. Risk factors include obesity, lack of exercise, being elderly, post-menopausal and sedentary lifestyle.  Her past medical history is significant for diabetes, hyperlipidemia and hypertension. Prior diagnostic workup includes chest x-ray and echocardiogram.   She also reports increase weakness since fall in office parking lot 3weeks ago. She has noticed fall when using cane, but no fall with walker. She also reports worsened left should pain with limited ROM since fall. She denies any syncopal episode or LOC. No confusion, no headache. Normal left hip and sacrum x-ray completed 05/08/2020: no acute finding.  Wt Readings from Last 3 Encounters:  05/23/20 291 lb 12.8 oz (132.4 kg)  03/26/20 291 lb 9.6 oz (132.3 kg)  02/02/20 288 lb (130.6 kg)   BP Readings from Last 3 Encounters:  05/23/20 (!) 146/86  05/17/20 (!) 159/83  05/08/20  140/78   Reviewed past Medical, Social and Family history today.  Outpatient Medications Prior to Visit  Medication Sig Dispense Refill  . albuterol (VENTOLIN HFA) 108 (90 Base) MCG/ACT inhaler Inhale 1-2 puffs into the lungs every 6 (six) hours as needed for wheezing or shortness of breath. No additional refill without office visit 6.7 each 0  . allopurinol (ZYLOPRIM) 100 MG tablet TAKE 1 TABLET BY MOUTH EVERY DAY 90 tablet 3  . aspirin EC 81 MG tablet Take 81 mg by mouth daily.    Marland Kitchen atorvastatin (LIPITOR) 10 MG tablet TAKE 1 TABLET (10 MG TOTAL) BY MOUTH DAILY AT 6 PM. 90 tablet 3  . Carboxymethylcellulose Sodium (EYE DROPS OP) Apply to eye. Lubricant eye drops    . cetirizine (ZYRTEC) 10 MG tablet Take 10 mg by mouth daily.    . cycloSPORINE (RESTASIS) 0.05 % ophthalmic emulsion Restasis 0.05 % eye drops in a dropperette    . lisinopril-hydrochlorothiazide (ZESTORETIC) 10-12.5 MG tablet TAKE 1 TABLET BY MOUTH EVERY DAY 90 tablet 1  . Multiple Vitamin (MULTIVITAMIN) tablet Take 1 tablet by mouth daily.    Marland Kitchen nystatin (MYCOSTATIN/NYSTOP) powder Apply 1 application topically 3 (three) times daily. 60 g 5  . omeprazole (PRILOSEC) 20 MG capsule TAKE 1 CAPSULE BY MOUTH EVERY DAY 90 capsule 1  . OVER THE COUNTER MEDICATION Cystic magnesium    . pregabalin (LYRICA) 150 MG capsule Take 1 capsule (150 mg total) by mouth 2 (two) times daily. 60 capsule 5  . Sennosides 8.6 MG CAPS Take by mouth.    Marland Kitchen  SYMBICORT 160-4.5 MCG/ACT inhaler INHALE 2 PUFFS INTO THE LUNGS IN THE MORNING AND AT BEDTIME. RINSE MOUTH AFTER EACH USE 30.6 Inhaler 7  . traMADol (ULTRAM) 50 MG tablet Take 1 tablet (50 mg total) by mouth 3 (three) times daily. 90 tablet 5  . Vitamin D, Ergocalciferol, (DRISDOL) 1.25 MG (50000 UNIT) CAPS capsule TAKE 1 CAPSULE (50,000 UNITS TOTAL) BY MOUTH EVERY 7 (SEVEN) DAYS 5 capsule 0  . Ubrogepant (UBRELVY) 50 MG TABS Take 50 mg by mouth as needed (take 1 at onset of headache, may repeat once 2  hours after initial dosing). (Patient not taking: No sig reported) 12 tablet 11   No facility-administered medications prior to visit.    ROS See HPI  Objective:  BP (!) 146/86 (BP Location: Left Arm, Patient Position: Sitting, Cuff Size: Large)   Pulse 78   Temp 97.8 F (36.6 C) (Temporal)   Ht 4\' 11"  (1.499 m)   Wt 291 lb 12.8 oz (132.4 kg)   SpO2 96%   BMI 58.94 kg/m   ECG: NSR, no change compared to 03/2019 tracing.  Physical Exam Constitutional:      Appearance: She is obese.  Cardiovascular:     Rate and Rhythm: Normal rate and regular rhythm.     Pulses: Normal pulses.     Heart sounds: Normal heart sounds.  Pulmonary:     Effort: Pulmonary effort is normal.     Breath sounds: No stridor. Wheezing present. No rhonchi or rales.     Comments: SOB and wheezing walking from exam room to lab. Musculoskeletal:     Right shoulder: Tenderness and bony tenderness present. No swelling, deformity, effusion or crepitus. Decreased range of motion. Decreased strength. Normal pulse.     Left shoulder: Tenderness and bony tenderness present. No swelling, deformity, effusion or crepitus. Normal range of motion. Normal strength. Normal pulse.     Right upper arm: Normal.     Left upper arm: Normal.     Cervical back: Normal range of motion and neck supple.  Neurological:     Mental Status: She is alert and oriented to person, place, and time.     Cranial Nerves: No cranial nerve deficit.     Comments: Ambulates with cane today. Arthralgic gait and slow but steady    Assessment & Plan:  This visit occurred during the SARS-CoV-2 public health emergency.  Safety protocols were in place, including screening questions prior to the visit, additional usage of staff PPE, and extensive cleaning of exam room while observing appropriate contact time as indicated for disinfecting solutions.   Sandra Brennan was seen today for acute visit.  Diagnoses and all orders for this visit:  Atypical chest  pain -     EKG 12-Lead -     DG Chest 2 View -     Troponin I (High Sensitivity)  Acute pain of left shoulder -     DG Shoulder Left -     naproxen (NAPROSYN) 500 MG tablet; Take 1 tablet (500 mg total) by mouth 2 (two) times daily with a meal.  Use walker with seat at all times. Due to CKD-3a, take naproxen BID with food x 5days only to help with pain. Go to lab for shoulder and CXR x-ray, and blood draw  Problem List Items Addressed This Visit   None   Visit Diagnoses    Atypical chest pain    -  Primary   Relevant Orders   EKG 12-Lead (Completed)  DG Chest 2 View   Troponin I (High Sensitivity)   Acute pain of left shoulder       Relevant Medications   naproxen (NAPROSYN) 500 MG tablet   Other Relevant Orders   DG Shoulder Left      Follow-up: Return if symptoms worsen or fail to improve.  Wilfred Lacy, NP

## 2020-05-23 NOTE — Patient Instructions (Addendum)
Schedule appt with Dr. Krista Blue to discussed sleep disturbance and increase fatigue despite use of CPAP machine.  Use walker with seat at all times.  Start naproxen BID with food x 5days, then stop Go to lab for shoulder and CXR x-ray, and blood draw

## 2020-05-23 NOTE — Addendum Note (Signed)
Addended by: Lynnea Ferrier on: 05/23/2020 01:08 PM   Modules accepted: Orders

## 2020-05-24 ENCOUNTER — Encounter: Payer: Self-pay | Admitting: Physical Therapy

## 2020-05-24 ENCOUNTER — Ambulatory Visit: Payer: Medicare Other | Admitting: Physical Therapy

## 2020-05-24 VITALS — BP 148/80 | HR 88

## 2020-05-24 DIAGNOSIS — M6281 Muscle weakness (generalized): Secondary | ICD-10-CM | POA: Diagnosis not present

## 2020-05-24 DIAGNOSIS — R262 Difficulty in walking, not elsewhere classified: Secondary | ICD-10-CM | POA: Diagnosis not present

## 2020-05-24 DIAGNOSIS — M79642 Pain in left hand: Secondary | ICD-10-CM

## 2020-05-24 DIAGNOSIS — R2689 Other abnormalities of gait and mobility: Secondary | ICD-10-CM

## 2020-05-24 DIAGNOSIS — M79641 Pain in right hand: Secondary | ICD-10-CM

## 2020-05-24 NOTE — Therapy (Signed)
[place], [place], [address] Phone: [phone]   Fax:  [phone]  Physical Therapy Treatment  Patient Details  Name: Sandra Brennan MRN: [id number] Date of Birth: April 23, 1945 Referring Provider (PT): Flossie Buffy, NP   Encounter Date: 05/17/2020    Past Medical History:  Diagnosis Date  . Anemia   . Arthritis    "plenty" (08-12-2012)  . Asthma   . Chronic lower back pain    "they say I need a whole new spinal column" (08-12-2012)  . COPD (chronic obstructive pulmonary disease) ([place])   . Degenerative arthritis    "all over" (08/12/2012)  . Fx ankle   . GERD (gastroesophageal reflux disease)   . Gout 05/05/2013  . Hypertension   . Migraines    "used to; totally stopped when I quit drinking 30 yr ago" (08/12/2012)  . Myalgia and myositis   . Myocardial infarction [facility]) 1992   2012/08/12 "mild MI when son died "  . Obesity   . Osteoporosis    "all over" (08-12-2012)  . Shortness of breath    when stressed.  . Sleep apnea    "never did fix my machine; haven't used one for 5 years; I've lost 116# since then; no problems now" (08-12-12)  . Type II diabetes mellitus ([place])    "borderline; don't test; take Metformin" (08/12/2012)    Past Surgical History:  Procedure Laterality Date  . ABDOMINAL HYSTERECTOMY  1990's  . GANGLION CYST EXCISION Right 1980's   "wrist" (2012/08/12)  . JOINT REPLACEMENT    . KNEE LIGAMENT RECONSTRUCTION Right 1980's  . TONSILLECTOMY  ~ 1954  . TOTAL HIP ARTHROPLASTY Left Aug 12, 2012  . TOTAL HIP ARTHROPLASTY Left August 12, 2012   Procedure: TOTAL HIP ARTHROPLASTY;  Surgeon: Garald Balding, MD;  Location: [facility];  Service: Orthopedics;  Laterality: Left;  Left Total Hip Arthroplasty  . TOTAL HIP ARTHROPLASTY Left 08/28/2012   Procedure: Irrigation and Debridement hip ;  Surgeon: Mcarthur Rossetti, MD;  Location: [facility];  Service: Orthopedics;  Laterality: Left;  . TOTAL KNEE  ARTHROPLASTY Left 2001  . TOTAL KNEE ARTHROPLASTY Right 2004    Vitals:   05/17/20 0956  BP: (!) 159/83  Pulse: 89  SpO2: 92%                        [facility] Adult PT Treatment/Exercise - 05/24/20 0001      Knee/Hip Exercises: Standing   Heel Raises Both;2 sets;10 reps    Other Standing Knee Exercises Airex lateral weightshifts x 20 reps ea. way, Romberg eyes open feet apart on Airex 20 sec x 3 CGA for balance      Knee/Hip Exercises: Seated   Long Arc Quad 2 sets;10 reps    Long Arc Quad Weight 2 lbs.    Clamshell with TheraBand Green   2x10   Hamstring Curl AROM;Strengthening;Right;Left;2 sets;10 reps    Hamstring Limitations green band    Abduction/Adduction  --   hip adduction isometric with small ball 5 sec x 10 reps                   PT Short Term Goals - 04/25/20 1938      PT SHORT TERM GOAL #1   Title Independent with initial/basic HEP    Status On-going      PT SHORT TERM GOAL #2   Title Review pt. FOTO self report by visit 3  Status Achieved      PT SHORT TERM GOAL #3   Title Assess 5 times sit<>stand to establish baseline for improving ability for transfers    Status Achieved             PT Long Term Goals - 05/15/20 1009      PT LONG TERM GOAL #1   Title Improve FOTO outcome measure score to 43% functional status or greater    Baseline 45% on 05/15/20    Time 6    Period Weeks    Status Achieved      PT LONG TERM GOAL #2   Title Improve Tinetti Gait + Balance score at least 5 points to work towards decreased fall risk    Baseline 11/28    Time 6    Period Weeks    Status New      PT LONG TERM GOAL #3   Title Improve bilat. LE strength at least grossly 1/2 MMT grade to improve ability for transfers from low chairs and bed mobility for sit<>supine    Baseline see objective    Time 6    Period Weeks    Status New      PT LONG TERM GOAL #4   Title 5 times sit<>stand goal to be set pending assessment                   Patient will benefit from skilled therapeutic intervention in order to improve the following deficits and impairments:  Pain,Impaired UE functional use,Decreased strength,Decreased activity tolerance,Abnormal gait,Decreased balance,Impaired sensation,Obesity,Difficulty walking,Decreased endurance  Visit Diagnosis: Other abnormalities of gait and mobility  Muscle weakness (generalized)  Difficulty in walking, not elsewhere classified  Pain in left hand  Pain in right hand     Problem List Patient Active Problem List   Diagnosis Date Noted  . Intertrigo 03/27/2020  . Anemia 08/23/2019  . Polyethylene liner wear following total hip arthroplasty requiring isolated polyethylene liner exchange ([place]) 07/12/2019  . Adjustment disorder with mixed anxiety and depressed mood 07/12/2018  . Migraine without aura and without status migrainosus, not intractable 09/10/2017  . Post-traumatic osteoarthritis of right ankle 06/09/2016  . Spondylosis without myelopathy or radiculopathy, lumbar region 06/09/2016  . Cervical spinal stenosis 06/09/2016  . Spinal stenosis, lumbar region, without neurogenic claudication 06/09/2016  . Chronic gouty arthritis 06/04/2016  . Vitamin D deficiency 06/02/2016  . Hyperlipidemia 06/02/2016  . Closed right ankle fracture 05/01/2016  . Colon polyps 05/01/2016  . Fibromyalgia syndrome 05/01/2016  . Syncope 03/30/2016  . Chest pain 03/30/2016  . Fall 03/30/2016  . GERD with esophagitis 06/21/2015  . Pharyngeal dysphagia 06/21/2015  . Obesity 05/06/2013  . Acute nontraumatic kidney injury ([place]) 05/05/2013  . Acute posthemorrhagic anemia 09/06/2012  . Gout 09/05/2012  . Osteoarthritis of left hip 08/05/2012  . Type 2 diabetes mellitus with diabetic polyneuropathy, without long-term current use of insulin ([place]) 08/05/2012  . Hypertension 08/05/2012  . Asthma, chronic 08/05/2012  . Sleep apnea 08/05/2012    Beaulah Dinning, PT,  DPT 05/24/20 8:38 AM  [facility] [address] [place], [place], [address] Phone: [phone]   Fax:  [phone]  Name: TIKI TUCCIARONE MRN: [id number] Date of Birth: August 02, 1945

## 2020-05-24 NOTE — Therapy (Signed)
[place], [place], [address] Phone: [phone]   Fax:  [phone]  Physical Therapy Treatment/Recertification Progress Note Reporting Period 04/12/20 to 05/24/20  See note below for Objective Data and Assessment of Progress/Goals.       Patient Details  Name: Sandra Brennan MRN: [id number] Date of Birth: [birth year] Referring Provider (PT): Flossie Buffy, NP   Encounter Date: 05/24/2020   PT End of Session - 05/24/20 0939    Visit Number 9    Number of Visits 21    Date for PT Re-Evaluation 07/05/20    Authorization Type UHC MCR, MCD secondary, next progress note at visit 19 (progress note completed at visit 9)    Progress Note Due on Visit 19    PT Start Time 0936    PT Stop Time 1015    PT Time Calculation (min) 39 min    Activity Tolerance Patient limited by pain    Behavior During Therapy [facility] for tasks assessed/performed           Past Medical History:  Diagnosis Date  . Anemia   . Arthritis    "plenty" (2012/08/14)  . Asthma   . Chronic lower back pain    "they say I need a whole new spinal column" (2012-08-14)  . COPD (chronic obstructive pulmonary disease) ([place])   . Degenerative arthritis    "all over" (14-Aug-2012)  . Fx ankle   . GERD (gastroesophageal reflux disease)   . Gout 05/05/2013  . Hypertension   . Migraines    "used to; totally stopped when I quit drinking 30 yr ago" (14-Aug-2012)  . Myalgia and myositis   . Myocardial infarction [facility]) 1992   Aug 14, 2012 "mild MI when son died "  . Obesity   . Osteoporosis    "all over" (Aug 14, 2012)  . Shortness of breath    when stressed.  . Sleep apnea    "never did fix my machine; haven't used one for 5 years; I've lost 116# since then; no problems now" (08-14-12)  . Type II diabetes mellitus ([place])    "borderline; don't test; take Metformin" (2012-08-14)    Past Surgical History:  Procedure Laterality Date  . ABDOMINAL HYSTERECTOMY   1990's  . GANGLION CYST EXCISION Right 1980's   "wrist" (08-14-2012)  . JOINT REPLACEMENT    . KNEE LIGAMENT RECONSTRUCTION Right 1980's  . TONSILLECTOMY  ~ 1954  . TOTAL HIP ARTHROPLASTY Left 08/14/2012  . TOTAL HIP ARTHROPLASTY Left 2012-08-14   Procedure: TOTAL HIP ARTHROPLASTY;  Surgeon: Garald Balding, MD;  Location: [place];  Service: Orthopedics;  Laterality: Left;  Left Total Hip Arthroplasty  . TOTAL HIP ARTHROPLASTY Left 08/28/2012   Procedure: Irrigation and Debridement hip ;  Surgeon: Mcarthur Rossetti, MD;  Location: [place];  Service: Orthopedics;  Laterality: Left;  . TOTAL KNEE ARTHROPLASTY Left 2001  . TOTAL KNEE ARTHROPLASTY Right 2004    Vitals:   05/24/20 0940  BP: (!) 148/80  Pulse: 88  SpO2: 97%     Subjective Assessment - 05/24/20 0943    Subjective Pt. was able to see Flossie Buffy, NP yesterday for follow up s/p recent falls. X-rays were performed for left shoulder which showed OA but were (-) for fx. She had also been having some chest pain and had EKG performed as well as labs for troponin-unable to draw sufficient blood for test so has to return for this. She also reported referred to see  neurologist for RLE symptoms. Pain 7/10 this AM-notes slept better last night. She was recommended to use walker and arrives today using rollator instead of cane. Had some mild chest pain earlier this AM but no chest pain pre-tx.    Pertinent History Diabetic, chronic pain, bilat. TKA, left THA, MI, COPD-uses CPAP, right ankle fx., osteoporosis, obesity    Limitations Walking;Standing;Lifting;House hold activities    Diagnostic tests Recent x-ray negative for hip/sacral fracture, shoulder X-ray 05/23/20 (-) for fx.    Patient Stated Goals Improve walking and decrease pain    Currently in Pain? Yes    Pain Score 7     Pain Location Generalized   neck, left shoulder, LE right>left including knees and ankles   Pain Orientation Right;Left    Pain Descriptors / Indicators  Aching;Throbbing    Pain Type Chronic pain    Pain Onset More than a month ago    Pain Frequency Constant    Aggravating Factors  activity, walking    Pain Relieving Factors medication    Effect of Pain on Daily Activities Limits activity and positional tolerance              [facility] PT Assessment - 05/24/20 0001      Assessment   Medical Diagnosis Unsteady gait, hand OA    Referring Provider (PT) Flossie Buffy, NP    Next MD Visit 06/26/20      Precautions   Precautions Fall      Strength   Right Hip Flexion 4-/5    Left Hip Flexion 4/5    Right Knee Flexion 5/5    Right Knee Extension 5/5    Left Knee Flexion 5/5    Left Knee Extension 5/5    Right Ankle Dorsiflexion 4/5    Right Ankle Inversion 4-/5    Right Ankle Eversion 4/5    Left Ankle Dorsiflexion 4+/5    Left Ankle Inversion 4+/5    Left Ankle Eversion 4+/5      Transfers   Five time sit to stand comments  21 seconds      Ambulation/Gait   Gait Comments Tinetti Gait + Balance 17/28, Pt. now ambulating with rollator walker-able to demo/perform step-through gait pattern but with decreased left steplength      Functional Gait  Assessment   Gait assessed  No                         [facility] Adult PT Treatment/Exercise - 05/24/20 1110      Knee/Hip Exercises: Aerobic   Nustep L4 x 5 min UE/LE      Knee/Hip Exercises: Standing   Heel Raises Both;2 sets;10 reps    Forward Step Up Right;Left;1 set;10 reps;Hand Hold: 2;Step Height: 2"    Functional Squat Limitations mini squat at counter with bilat. UE support and chair behind pt. for safety x 10 reps    Rocker Board 1 minute    Rocker Board Limitations dynamic lateral balance with bilat. hand support    Other Standing Knee Exercises Romberg 20 sec x 3 eyes open/closed with CGA      Knee/Hip Exercises: Seated   Long Arc Quad 2 sets;10 reps    Long Arc Quad Weight 3 lbs.    Clamshell with TheraBand Green   2x10   Hamstring Curl  AROM;Strengthening;Right;Left;2 sets;10 reps    Hamstring Limitations green band    Abduction/Adduction  --   hip adduction isometric with small  ball 5 sec x 10 reps                   PT Short Term Goals - 05/24/20 1116      PT SHORT TERM GOAL #1   Title Independent with initial/basic HEP    Baseline met    Time 2    Period Weeks    Status Achieved      PT SHORT TERM GOAL #2   Title Review pt. FOTO self report by visit 3    Baseline met    Time 2    Period Weeks    Status Achieved      PT SHORT TERM GOAL #3   Title Assess 5 times sit<>stand to establish baseline for improving ability for transfers    Baseline met    Time 2    Period Weeks    Status Achieved             PT Long Term Goals - 05/24/20 1117      PT LONG TERM GOAL #1   Title Improve FOTO outcome measure score to 43% functional status or greater    Baseline 45% on 05/15/20    Time 6    Period Weeks    Status Achieved      PT LONG TERM GOAL #2   Title Improve Tinetti Gait + Balance score at least 5 points to work towards decreased fall risk    Baseline met-revise goal for Tinetti score >19/28 for decreased fall risk    Time 6    Period Weeks    Status Revised    Target Date 07/05/20      PT LONG TERM GOAL #3   Title Improve bilat. LE strength at least grossly 1/2 MMT grade to improve ability for transfers from low chairs and bed mobility for sit<>supine    Baseline see objective    Time 6    Period Weeks    Status Partially Met    Target Date 07/05/20      PT LONG TERM GOAL #4   Title Improve 5 times sit<>stand time to 15 seconds or less as demonstration of improved functional LE strength and to improve transfer ability from low seats    Baseline 21 sec 05/24/20    Time 6    Period Weeks    Status New    Target Date 07/05/20                 Plan - 05/24/20 1120    Clinical Impression Statement Pt. has attended 9 therapy visits since/including initial evaluation with recent  setback from multiple recent falls. She has made some improvements Tinetti Gait + Balance score from initial assessment but score of 17/28 still suggestive of high fall risk. Status for exercise and activity progression impacted by factors including chronic pain with high pain level impacting activity tolerance as well as medical comorbidities. Pt. has also made strength gains for LE from baseline particularly for improved knee and hip flexion strength but still with weakness impacting ability for transfers and ambulation. Continue to recommend walker use (now using rollator) for increased safety given balance issues and recent falls. Etiology of RLE symptoms with parasthesias and intermittent difficulty moving right leg still unclear and pending further referral and assessment as noted in subjective. Plan continue therapy to work on improving safety with mobility to decrease fall risk.    Personal Factors and Comorbidities Time since onset of injury/illness/exacerbation;Comorbidity 3+  Comorbidities see PMH    Examination-Activity Limitations Dressing;Bathing;Transfers;Lift;Squat;Locomotion Level;Stairs;Stand;Carry;Bed Mobility    Examination-Participation Restrictions Meal Prep;Shop;Community Activity;Laundry;Cleaning    Stability/Clinical Decision Making Evolving/Moderate complexity    Clinical Decision Making Moderate    Rehab Potential Fair    PT Frequency 2x / week    PT Duration 6 weeks    PT Treatment/Interventions ADLs/Self Care Home Management;Cryotherapy;Electrical Stimulation;Iontophoresis 43m/ml Dexamethasone;Moist Heat;Therapeutic activities;Gait training;Therapeutic exercise;Patient/family education;Manual techniques;Dry needling;Balance training;Neuromuscular re-education;Functional mobility training;Stair training    PT Next Visit Plan Continue/progress LE strengthening as tolerated with open and closed chain exercises, continue balance/gait    PT Home Exercise Plan [id number] - patient  instructed to modify HEP to perform exercises mainly sitting or supine until improved pain with standing    Consulted and Agree with Plan of Care Patient           Patient will benefit from skilled therapeutic intervention in order to improve the following deficits and impairments:  Pain,Impaired UE functional use,Decreased strength,Decreased activity tolerance,Abnormal gait,Decreased balance,Impaired sensation,Obesity,Difficulty walking,Decreased endurance  Visit Diagnosis: Muscle weakness (generalized)  Other abnormalities of gait and mobility  Difficulty in walking, not elsewhere classified  Pain in left hand  Pain in right hand     Problem List Patient Active Problem List   Diagnosis Date Noted  . Intertrigo 03/27/2020  . Anemia 08/23/2019  . Polyethylene liner wear following total hip arthroplasty requiring isolated polyethylene liner exchange ([place] 07/12/2019  . Adjustment disorder with mixed anxiety and depressed mood 07/12/2018  . Migraine without aura and without status migrainosus, not intractable 09/10/2017  . Post-traumatic osteoarthritis of right ankle 06/09/2016  . Spondylosis without myelopathy or radiculopathy, lumbar region 06/09/2016  . Cervical spinal stenosis 06/09/2016  . Spinal stenosis, lumbar region, without neurogenic claudication 06/09/2016  . Chronic gouty arthritis 06/04/2016  . Vitamin D deficiency 06/02/2016  . Hyperlipidemia 06/02/2016  . Closed right ankle fracture 05/01/2016  . Colon polyps 05/01/2016  . Fibromyalgia syndrome 05/01/2016  . Syncope 03/30/2016  . Chest pain 03/30/2016  . Fall 03/30/2016  . GERD with esophagitis 06/21/2015  . Pharyngeal dysphagia 06/21/2015  . Obesity 05/06/2013  . Acute nontraumatic kidney injury ([place] 05/05/2013  . Acute posthemorrhagic anemia 09/06/2012  . Gout 09/05/2012  . Osteoarthritis of left hip 08/05/2012  . Type 2 diabetes mellitus with diabetic polyneuropathy, without long-term current use  of insulin ([place] 08/05/2012  . Hypertension 08/05/2012  . Asthma, chronic 08/05/2012  . Sleep apnea 08/05/2012    CBeaulah Dinning PT, DPT 05/24/20 11:27 AM  [facility] [address]Phone: [phone]  Fax:  [phone] Name: Sandra HOBSONMRN: [id number] of Birth: [birth year]

## 2020-05-25 ENCOUNTER — Other Ambulatory Visit (INDEPENDENT_AMBULATORY_CARE_PROVIDER_SITE_OTHER): Payer: Medicare Other

## 2020-05-25 ENCOUNTER — Telehealth: Payer: Self-pay | Admitting: Nurse Practitioner

## 2020-05-25 ENCOUNTER — Other Ambulatory Visit: Payer: Self-pay

## 2020-05-25 DIAGNOSIS — R0789 Other chest pain: Secondary | ICD-10-CM

## 2020-05-25 DIAGNOSIS — R0609 Other forms of dyspnea: Secondary | ICD-10-CM

## 2020-05-25 DIAGNOSIS — R0600 Dyspnea, unspecified: Secondary | ICD-10-CM

## 2020-05-25 LAB — TROPONIN I (HIGH SENSITIVITY): High Sens Troponin I: 6 ng/L (ref 2–17)

## 2020-05-25 NOTE — Addendum Note (Signed)
Addended by: Leana Gamer on: 05/25/2020 03:15 PM   Modules accepted: Orders

## 2020-05-25 NOTE — Progress Notes (Signed)
error 

## 2020-05-25 NOTE — Telephone Encounter (Signed)
-----   Message from Lynnea Ferrier, RT sent at 05/25/2020 10:21 AM EDT ----- Salley Scarlet, [place], [place] and I tried multiple time to get blood from her. We couldn't get a drop. She was on the schedule to come back today for the labs but I see she's not there anymore. She is very hard to get blood from. I think Ive only gotten her once. We told her to hydrate very well and come back. ----- Message ----- From: Flossie Buffy, NP Sent: 05/25/2020   8:14 AM EDT To: Lynnea Ferrier, RT  Good morning, I ordered a troponin for this patient and it was never drawn. Can you please find out why? Thank you

## 2020-05-28 ENCOUNTER — Encounter: Payer: Self-pay | Admitting: Cardiology

## 2020-05-28 ENCOUNTER — Other Ambulatory Visit: Payer: Self-pay

## 2020-05-28 ENCOUNTER — Ambulatory Visit (INDEPENDENT_AMBULATORY_CARE_PROVIDER_SITE_OTHER): Payer: Medicare Other | Admitting: Cardiology

## 2020-05-28 VITALS — BP 123/70 | HR 72 | Ht 59.0 in | Wt 293.4 lb

## 2020-05-28 DIAGNOSIS — E7800 Pure hypercholesterolemia, unspecified: Secondary | ICD-10-CM | POA: Diagnosis not present

## 2020-05-28 DIAGNOSIS — R0789 Other chest pain: Secondary | ICD-10-CM | POA: Diagnosis not present

## 2020-05-28 DIAGNOSIS — I10 Essential (primary) hypertension: Secondary | ICD-10-CM | POA: Diagnosis not present

## 2020-05-28 NOTE — Progress Notes (Signed)
Cardiology Office Note   Date:  05/28/2020   ID:  Risa, Auman 02/01/1946, MRN [id number]  PCP:  Flossie Buffy, NP  Cardiologist:   Oza Oberle Martinique, MD   No chief complaint on file.     History of Present Illness: Sandra Brennan is a 75 y.o. female who is seen at the request of Dr Lorayne Marek for evaluation of chest pain. She has a history of HTN, HLD, pre diabetes, COPD, GERD. Prior cardiac evaluation in 2018 include a Myoview study showing ? Lateral infarct with EF 39%. Echo at the same time showed normal LV function.   More recently she reports she had multiple falls. She gets numbness and tingling in her leg then it tightens and locks up and she falls. A week ago she complained of pain in the right parasternal area. It is described as sharp and pulling. It was pretty constant and is still there but not as severe. It is worse with movement and palpation and she even feels it when she uses her inhalers.     Past Medical History:  Diagnosis Date  . Anemia   . Arthritis    "plenty" (08-14-12)  . Asthma   . Chronic lower back pain    "they say I need a whole new spinal column" (2012-08-14)  . COPD (chronic obstructive pulmonary disease) ([place])   . Degenerative arthritis    "all over" (08/14/12)  . Fx ankle   . GERD (gastroesophageal reflux disease)   . Gout 05/05/2013  . Hypertension   . Migraines    "used to; totally stopped when I quit drinking 30 yr ago" (August 14, 2012)  . Myalgia and myositis   . Myocardial infarction [facility]) 1992   2012/08/14 "mild MI when son died "  . Obesity   . Osteoporosis    "all over" (14-Aug-2012)  . Shortness of breath    when stressed.  . Sleep apnea    "never did fix my machine; haven't used one for 5 years; I've lost 116# since then; no problems now" (14-Aug-2012)  . Type II diabetes mellitus ([place])    "borderline; don't test; take Metformin" (August 14, 2012)    Past Surgical History:  Procedure Laterality Date  . ABDOMINAL HYSTERECTOMY  1990's   . GANGLION CYST EXCISION Right 1980's   "wrist" (14-Aug-2012)  . JOINT REPLACEMENT    . KNEE LIGAMENT RECONSTRUCTION Right 1980's  . TONSILLECTOMY  ~ 1954  . TOTAL HIP ARTHROPLASTY Left August 14, 2012  . TOTAL HIP ARTHROPLASTY Left 08/14/12   Procedure: TOTAL HIP ARTHROPLASTY;  Surgeon: Garald Balding, MD;  Location: [place];  Service: Orthopedics;  Laterality: Left;  Left Total Hip Arthroplasty  . TOTAL HIP ARTHROPLASTY Left 08/28/2012   Procedure: Irrigation and Debridement hip ;  Surgeon: Mcarthur Rossetti, MD;  Location: [place];  Service: Orthopedics;  Laterality: Left;  . TOTAL KNEE ARTHROPLASTY Left 2001  . TOTAL KNEE ARTHROPLASTY Right 2004     Current Outpatient Medications  Medication Sig Dispense Refill  . albuterol (VENTOLIN HFA) 108 (90 Base) MCG/ACT inhaler Inhale 1-2 puffs into the lungs every 6 (six) hours as needed for wheezing or shortness of breath. No additional refill without office visit 6.7 each 0  . allopurinol (ZYLOPRIM) 100 MG tablet TAKE 1 TABLET BY MOUTH EVERY DAY 90 tablet 3  . aspirin EC 81 MG tablet Take 81 mg by mouth daily.    Marland Kitchen atorvastatin (LIPITOR) 10 MG tablet TAKE 1 TABLET (10  MG TOTAL) BY MOUTH DAILY AT 6 PM. 90 tablet 3  . Carboxymethylcellulose Sodium (EYE DROPS OP) Apply to eye. Lubricant eye drops    . cetirizine (ZYRTEC) 10 MG tablet Take 10 mg by mouth daily.    . cycloSPORINE (RESTASIS) 0.05 % ophthalmic emulsion Restasis 0.05 % eye drops in a dropperette    . lisinopril-hydrochlorothiazide (ZESTORETIC) 10-12.5 MG tablet TAKE 1 TABLET BY MOUTH EVERY DAY 90 tablet 1  . Multiple Vitamin (MULTIVITAMIN) tablet Take 1 tablet by mouth daily.    . naproxen (NAPROSYN) 500 MG tablet Take 1 tablet (500 mg total) by mouth 2 (two) times daily with a meal. 10 tablet 0  . nystatin (MYCOSTATIN/NYSTOP) powder Apply 1 application topically 3 (three) times daily. 60 g 5  . omeprazole (PRILOSEC) 20 MG capsule TAKE 1 CAPSULE BY MOUTH EVERY DAY 90 capsule 1  . OVER  THE COUNTER MEDICATION Cystic magnesium    . pregabalin (LYRICA) 150 MG capsule Take 1 capsule (150 mg total) by mouth 2 (two) times daily. 60 capsule 5  . Sennosides 8.6 MG CAPS Take by mouth.    . SYMBICORT 160-4.5 MCG/ACT inhaler INHALE 2 PUFFS INTO THE LUNGS IN THE MORNING AND AT BEDTIME. RINSE MOUTH AFTER EACH USE 30.6 Inhaler 7  . traMADol (ULTRAM) 50 MG tablet Take 1 tablet (50 mg total) by mouth 3 (three) times daily. 90 tablet 5  . Vitamin D, Ergocalciferol, (DRISDOL) 1.25 MG (50000 UNIT) CAPS capsule TAKE 1 CAPSULE (50,000 UNITS TOTAL) BY MOUTH EVERY 7 (SEVEN) DAYS 5 capsule 0  . Ubrogepant (UBRELVY) 50 MG TABS Take 50 mg by mouth as needed (take 1 at onset of headache, may repeat once 2 hours after initial dosing). (Patient not taking: No sig reported) 12 tablet 11   No current facility-administered medications for this visit.    Allergies:   Cymbalta [duloxetine hcl], Pineapple, Influenza vaccines, Penicillins, Sulfa antibiotics, and Theophyllines    Social History:  The patient  reports that she has quit smoking. Her smoking use included cigarettes. She has a 0.24 pack-year smoking history. She has never used smokeless tobacco. She reports that she does not drink alcohol and does not use drugs.   Family History:  The patient's family history includes Arthritis in her father, maternal grandfather, maternal grandmother, mother, and sister; Colon cancer in her father; Diabetes in her maternal grandfather, maternal grandmother, and sister.    ROS:  Please see the history of present illness.   Otherwise, review of systems are positive for none.   All other systems are reviewed and negative.    PHYSICAL EXAM: VS:  BP 123/70 (BP Location: Left Arm, Patient Position: Sitting)   Pulse 72   Ht 4\' 11"  (1.499 m)   Wt 293 lb 6.4 oz (133.1 kg)   SpO2 95%   BMI 59.26 kg/m  , BMI Body mass index is 59.26 kg/m. GEN: Well nourished, obese, in no acute distress. Walks with a walker.  HEENT:  normal  Neck: no JVD, carotid bruits, or masses Cardiac: RRR; no murmurs, rubs, or gallops,no edema  Respiratory:  clear to auscultation bilaterally, normal work of breathing Chest: significant tenderness to palpation in the right parasternal area- reproduces her pain. GI: soft, nontender, nondistended, + BS MS: no deformity or atrophy  Skin: warm and dry, no rash Neuro:  Strength and sensation are intact Psych: euthymic mood, full affect   EKG:  EKG is not ordered today. The ekg ordered 05/23/20 demonstrates NSR with nonspecific TW  abnormality in the anterior precordial leads.    Recent Labs: 03/26/2020: BUN 18; Creatinine, Ser 1.17; Hemoglobin 10.8; Platelets 219.0; Potassium 4.1; Sodium 139    Lipid Panel    Component Value Date/Time   CHOL 129 02/28/2019 0933   TRIG 57.0 02/28/2019 0933   HDL 59.80 02/28/2019 0933   CHOLHDL 2 02/28/2019 0933   VLDL 11.4 02/28/2019 0933   LDLCALC 58 02/28/2019 0933      Wt Readings from Last 3 Encounters:  05/28/20 293 lb 6.4 oz (133.1 kg)  05/23/20 291 lb 12.8 oz (132.4 kg)  03/26/20 291 lb 9.6 oz (132.3 kg)      Other studies Reviewed: Additional studies/ records that were reviewed today include:   Echo 03/31/16: Study Conclusions   - Left ventricle: The cavity size was normal. Wall thickness was  normal. The estimated ejection fraction was 55%. Although no  diagnostic regional wall motion abnormality was identified, this  possibility cannot be completely excluded on the basis of this  study. Doppler parameters are consistent with abnormal left  ventricular relaxation (grade 1 diastolic dysfunction).  - Aortic valve: There was no stenosis.  - Mitral valve: There was no significant regurgitation.  - Left atrium: The atrium was mildly dilated.  - Right ventricle: The cavity size was mildly dilated. Systolic  function was normal.  - Right atrium: The atrium was mildly dilated.  - Tricuspid valve: Peak RV-RA gradient  24 mmHg.  - Systemic veins: IVC was not visualized.   Impressions:   - Normal LV size and systolic function, EF 63%. RV mildly dilated  with normal systolic function. No significant valvular  abnormalities.   Myoview 03/31/16: MYOCARDIAL IMAGING WITH SPECT (REST AND PHARMACOLOGIC-STRESS)  GATED LEFT VENTRICULAR WALL MOTION STUDY  LEFT VENTRICULAR EJECTION FRACTION  TECHNIQUE: Standard myocardial SPECT imaging was performed after resting intravenous injection of 10 mCi Tc-7m tetrofosmin. Subsequently, intravenous infusion of Lexiscan was performed under the supervision of the Cardiology staff. At peak effect of the drug, 30 mCi Tc-62m tetrofosmin was injected intravenously and standard myocardial SPECT imaging was performed. Quantitative gated imaging was also performed to evaluate left ventricular wall motion, and estimate left ventricular ejection fraction.  COMPARISON:  Chest radiographs 03/30/2016  FINDINGS: Perfusion: The perfusion appears mildly heterogeneous, especially on the rest images. There is a small fixed perfusion defect in the lateral wall. There is a possible small area of reversibility within the mid to distal anterior septum.  Wall Motion: Generalized moderate hypokinesis. No focal wall motion abnormalities are seen.  Left Ventricular Ejection Fraction: 39 %  End diastolic volume 149 ml  End systolic volume 75 ml  IMPRESSION: 1. Heterogeneous myocardial perfusion with probable small infarct in the lateral wall. Cannot exclude ischemia in the mid to distal anterior septum.  2. Global hypokinesis.  3. Left ventricular ejection fraction 39%  4. Non invasive risk stratification*: Intermediate  *2012 Appropriate Use Criteria for Coronary Revascularization Focused Update: J Am Coll Cardiol. 7026;37(8):588-502. http://content.airportbarriers.com.aspx?articleid=1201161   Electronically Signed   By: Richardean Sale M.D.   On:  03/31/2016 13:30    ASSESSMENT AND PLAN:  1.  Atypical chest pain. By history and exam her pain is musculoskeletal. Reproduced on exam. I suspect this is related to her recent falls. While she does have some cardiac risk factors her Ecg shows no acute changes and troponin was normal. At this point I don't feel further cardiac work up is necessary. Continue risk factor modification. Rx chest wall pain with analgesics.  2. HTN controlled 3. HLD on statin.      Current medicines are reviewed at length with the patient today.  The patient does not have concerns regarding medicines.  The following changes have been made:  no change  Labs/ tests ordered today include:  No orders of the defined types were placed in this encounter.    Disposition:   FU with me PRN  Signed, Sheretta Grumbine Martinique, MD  05/28/2020 3:03 PM    [facility] [address] [place], [place], [address] Phone [phone], Fax [phone]

## 2020-05-29 ENCOUNTER — Encounter: Payer: Self-pay | Admitting: Physical Therapy

## 2020-05-29 ENCOUNTER — Ambulatory Visit: Payer: Medicare Other | Admitting: Physical Therapy

## 2020-05-29 VITALS — BP 149/69 | HR 79

## 2020-05-29 DIAGNOSIS — R262 Difficulty in walking, not elsewhere classified: Secondary | ICD-10-CM | POA: Diagnosis not present

## 2020-05-29 DIAGNOSIS — M79642 Pain in left hand: Secondary | ICD-10-CM

## 2020-05-29 DIAGNOSIS — M6281 Muscle weakness (generalized): Secondary | ICD-10-CM | POA: Diagnosis not present

## 2020-05-29 DIAGNOSIS — M79641 Pain in right hand: Secondary | ICD-10-CM

## 2020-05-29 DIAGNOSIS — R2689 Other abnormalities of gait and mobility: Secondary | ICD-10-CM

## 2020-05-29 NOTE — Therapy (Signed)
[place], [place], [address] Phone: [phone]   Fax:  [phone]  Physical Therapy Treatment  Patient Details  Name: Sandra Brennan MRN: [id number] Date of Birth: [birth year] Referring Provider (PT): Flossie Buffy, NP   Encounter Date: 05/29/2020   PT End of Session - 05/29/20 1016    Visit Number 10    Number of Visits 21    Date for PT Re-Evaluation 07/05/20    Authorization Type UHC MCR, MCD secondary, next progress note at visit 19 (progress note completed at visit 9)    Progress Note Due on Visit 19    PT Start Time 0928    PT Stop Time 1012    PT Time Calculation (min) 44 min    Activity Tolerance Patient limited by fatigue    Behavior During Therapy [facility] for tasks assessed/performed           Past Medical History:  Diagnosis Date  . Anemia   . Arthritis    "plenty" (August 13, 2012)  . Asthma   . Chronic lower back pain    "they say I need a whole new spinal column" (August 13, 2012)  . COPD (chronic obstructive pulmonary disease) ([place])   . Degenerative arthritis    "all over" (08/13/12)  . Fx ankle   . GERD (gastroesophageal reflux disease)   . Gout 05/05/2013  . Hypertension   . Migraines    "used to; totally stopped when I quit drinking 30 yr ago" (08-13-12)  . Myalgia and myositis   . Myocardial infarction [facility]) 1992   2012/08/13 "mild MI when son died "  . Obesity   . Osteoporosis    "all over" (08-13-2012)  . Shortness of breath    when stressed.  . Sleep apnea    "never did fix my machine; haven't used one for 5 years; I've lost 116# since then; no problems now" (13-Aug-2012)  . Type II diabetes mellitus ([place])    "borderline; don't test; take Metformin" (August 13, 2012)    Past Surgical History:  Procedure Laterality Date  . ABDOMINAL HYSTERECTOMY  1990's  . GANGLION CYST EXCISION Right 1980's   "wrist" (2012/08/13)  . JOINT REPLACEMENT    . KNEE LIGAMENT RECONSTRUCTION Right 1980's  .  TONSILLECTOMY  ~ 1954  . TOTAL HIP ARTHROPLASTY Left 08-13-2012  . TOTAL HIP ARTHROPLASTY Left August 13, 2012   Procedure: TOTAL HIP ARTHROPLASTY;  Surgeon: Garald Balding, MD;  Location: [place];  Service: Orthopedics;  Laterality: Left;  Left Total Hip Arthroplasty  . TOTAL HIP ARTHROPLASTY Left 08/28/2012   Procedure: Irrigation and Debridement hip ;  Surgeon: Mcarthur Rossetti, MD;  Location: [place];  Service: Orthopedics;  Laterality: Left;  . TOTAL KNEE ARTHROPLASTY Left 2001  . TOTAL KNEE ARTHROPLASTY Right 2004    Vitals:   05/29/20 0934  BP: (!) 149/69  Pulse: 79  SpO2: 94%     Subjective Assessment - 05/29/20 0934    Subjective No fall since last visit but a few "stumbles". Pt. continues with walker use. She saw cardiologist yesterday regarding chest pain which is suspected to be associated with muscular pain s/p fall.    Pertinent History Diabetic, chronic pain, bilat. TKA, left THA, MI, COPD-uses CPAP, right ankle fx., osteoporosis, obesity    Currently in Pain? Yes    Pain Score --   7-8   Pain Location Generalized   worse today in feet   Pain Orientation Right;Left    Pain Descriptors /  Indicators Aching;Throbbing    Pain Type Chronic pain    Pain Onset More than a month ago    Pain Frequency Constant    Aggravating Factors  acticity, walking    Pain Relieving Factors medication    Effect of Pain on Daily Activities limits activity and positional tolerance                             [facility] Adult PT Treatment/Exercise - 05/29/20 0001      Knee/Hip Exercises: Aerobic   Nustep L4 x 6 min UE/LE      Knee/Hip Exercises: Standing   Heel Raises Both;2 sets;10 reps    Forward Step Up Right;Left;1 set;10 reps;Hand Hold: 2;Step Height: 4"    Functional Squat Limitations mini squat at counter x 15 reps    Rocker Board 1 minute    Rocker Board Limitations dynamic lateral balance with bilat. hand support    Other Standing Knee Exercises Romberg partial  tandem 20 sec ea. bilat. at counter and feet together CGA at counter, toe taps x 20 reps at counter x 20 reps, partial tandem walking at counter with 1 UE support on counter and other hand holding walker 10 feet x 2      Knee/Hip Exercises: Seated   Long Arc Quad 2 sets;10 reps    Long Arc Quad Weight 4 lbs.    Clamshell with TheraBand Green   2x10   Hamstring Curl AROM;Strengthening;Right;Left;2 sets;10 reps    Hamstring Limitations green band    Abduction/Adduction  --   hip adduction isometric with small ball 5 sec x 15 reps                   PT Short Term Goals - 05/24/20 1116      PT SHORT TERM GOAL #1   Title Independent with initial/basic HEP    Baseline met    Time 2    Period Weeks    Status Achieved      PT SHORT TERM GOAL #2   Title Review pt. FOTO self report by visit 3    Baseline met    Time 2    Period Weeks    Status Achieved      PT SHORT TERM GOAL #3   Title Assess 5 times sit<>stand to establish baseline for improving ability for transfers    Baseline met    Time 2    Period Weeks    Status Achieved             PT Long Term Goals - 05/24/20 1117      PT LONG TERM GOAL #1   Title Improve FOTO outcome measure score to 43% functional status or greater    Baseline 45% on 05/15/20    Time 6    Period Weeks    Status Achieved      PT LONG TERM GOAL #2   Title Improve Tinetti Gait + Balance score at least 5 points to work towards decreased fall risk    Baseline met-revise goal for Tinetti score >19/28 for decreased fall risk    Time 6    Period Weeks    Status Revised    Target Date 07/05/20      PT LONG TERM GOAL #3   Title Improve bilat. LE strength at least grossly 1/2 MMT grade to improve ability for transfers from low chairs and bed mobility for sit<>supine  Baseline see objective    Time 6    Period Weeks    Status Partially Met    Target Date 07/05/20      PT LONG TERM GOAL #4   Title Improve 5 times sit<>stand time to 15  seconds or less as demonstration of improved functional LE strength and to improve transfer ability from low seats    Baseline 21 sec 05/24/20    Time 6    Period Weeks    Status New    Target Date 07/05/20                 Plan - 05/29/20 1017    Clinical Impression Statement Continued emphasis on balance and general LE strengthening. Activity tolerance still limited by fatigue but able to progress both balance and strengthening exercises from previous tx. sessions as noted per flowsheet with increased reps/volume. For hip strengthening still focusing on seated exercises given difficulty with open chain hip strengthening in standing from pain and balance issues. Therapy goals ongoing as from last progress note.    Personal Factors and Comorbidities Time since onset of injury/illness/exacerbation;Comorbidity 3+    Comorbidities see PMH    Examination-Activity Limitations Dressing;Bathing;Transfers;Lift;Squat;Locomotion Level;Stairs;Stand;Carry;Bed Mobility    Examination-Participation Restrictions Meal Prep;Shop;Community Activity;Laundry;Cleaning    Stability/Clinical Decision Making Evolving/Moderate complexity    Clinical Decision Making Moderate    Rehab Potential Fair    PT Frequency 2x / week    PT Duration 6 weeks    PT Treatment/Interventions ADLs/Self Care Home Management;Cryotherapy;Electrical Stimulation;Iontophoresis 82m/ml Dexamethasone;Moist Heat;Therapeutic activities;Gait training;Therapeutic exercise;Patient/family education;Manual techniques;Dry needling;Balance training;Neuromuscular re-education;Functional mobility training;Stair training    PT Next Visit Plan Continue/progress LE strengthening as tolerated with open and closed chain exercises, continue balance/gait    PT Home Exercise Plan [id number] - patient instructed to modify HEP to perform exercises mainly sitting or supine until improved pain with standing    Consulted and Agree with Plan of Care Patient            Patient will benefit from skilled therapeutic intervention in order to improve the following deficits and impairments:  Pain,Impaired UE functional use,Decreased strength,Decreased activity tolerance,Abnormal gait,Decreased balance,Impaired sensation,Obesity,Difficulty walking,Decreased endurance  Visit Diagnosis: Other abnormalities of gait and mobility  Muscle weakness (generalized)  Difficulty in walking, not elsewhere classified  Pain in left hand  Pain in right hand     Problem List Patient Active Problem List   Diagnosis Date Noted  . Intertrigo 03/27/2020  . Anemia 08/23/2019  . Polyethylene liner wear following total hip arthroplasty requiring isolated polyethylene liner exchange ([place] 07/12/2019  . Adjustment disorder with mixed anxiety and depressed mood 07/12/2018  . Migraine without aura and without status migrainosus, not intractable 09/10/2017  . Post-traumatic osteoarthritis of right ankle 06/09/2016  . Spondylosis without myelopathy or radiculopathy, lumbar region 06/09/2016  . Cervical spinal stenosis 06/09/2016  . Spinal stenosis, lumbar region, without neurogenic claudication 06/09/2016  . Chronic gouty arthritis 06/04/2016  . Vitamin D deficiency 06/02/2016  . Hyperlipidemia 06/02/2016  . Closed right ankle fracture 05/01/2016  . Colon polyps 05/01/2016  . Fibromyalgia syndrome 05/01/2016  . Syncope 03/30/2016  . Chest pain 03/30/2016  . Fall 03/30/2016  . GERD with esophagitis 06/21/2015  . Pharyngeal dysphagia 06/21/2015  . Obesity 05/06/2013  . Acute nontraumatic kidney injury ([place] 05/05/2013  . Acute posthemorrhagic anemia 09/06/2012  . Gout 09/05/2012  . Osteoarthritis of left hip 08/05/2012  . Type 2 diabetes mellitus with diabetic polyneuropathy, without long-term current use of  insulin ([place]) 08/05/2012  . Hypertension 08/05/2012  . Asthma, chronic 08/05/2012  . Sleep apnea 08/05/2012   Beaulah Dinning, PT, DPT 05/29/20 10:21  AM  [facility] [address] [place], [place], [address] Phone: [phone]   Fax:  [phone]  Name: Sandra Brennan MRN: [id number] Date of Birth: November 09, 1945

## 2020-05-31 ENCOUNTER — Encounter: Payer: Self-pay | Admitting: Physical Therapy

## 2020-05-31 ENCOUNTER — Ambulatory Visit: Payer: Medicare Other | Admitting: Physical Therapy

## 2020-05-31 ENCOUNTER — Other Ambulatory Visit: Payer: Self-pay

## 2020-05-31 DIAGNOSIS — R262 Difficulty in walking, not elsewhere classified: Secondary | ICD-10-CM

## 2020-05-31 DIAGNOSIS — M79641 Pain in right hand: Secondary | ICD-10-CM | POA: Diagnosis not present

## 2020-05-31 DIAGNOSIS — R2689 Other abnormalities of gait and mobility: Secondary | ICD-10-CM | POA: Diagnosis not present

## 2020-05-31 DIAGNOSIS — M6281 Muscle weakness (generalized): Secondary | ICD-10-CM

## 2020-05-31 DIAGNOSIS — M79642 Pain in left hand: Secondary | ICD-10-CM | POA: Diagnosis not present

## 2020-05-31 NOTE — Therapy (Signed)
[place], [place], [address] Phone: [phone]   Fax:  [phone]  Physical Therapy Treatment  Patient Details  Name: Sandra Brennan MRN: [id number] Date of Birth: 02-19-1946 Referring Provider (PT): Flossie Buffy, NP   Encounter Date: 05/31/2020   PT End of Session - 05/31/20 0954    Visit Number 11    Number of Visits 21    Date for PT Re-Evaluation 07/05/20    Authorization Type UHC MCR, MCD secondary, next progress note at visit 19 (progress note completed at visit 9)    Progress Note Due on Visit 22    PT Start Time 0940   arrive a few minutes late   PT Stop Time 1018    PT Time Calculation (min) 38 min    Activity Tolerance Patient limited by fatigue    Behavior During Therapy [facility] for tasks assessed/performed           Past Medical History:  Diagnosis Date  . Anemia   . Arthritis    "plenty" (08/11/2012)  . Asthma   . Chronic lower back pain    "they say I need a whole new spinal column" (08/11/2012)  . COPD (chronic obstructive pulmonary disease) ([place])   . Degenerative arthritis    "all over" (August 11, 2012)  . Fx ankle   . GERD (gastroesophageal reflux disease)   . Gout 05/05/2013  . Hypertension   . Migraines    "used to; totally stopped when I quit drinking 30 yr ago" (2012-08-11)  . Myalgia and myositis   . Myocardial infarction [facility]) 1992   08-11-12 "mild MI when son died "  . Obesity   . Osteoporosis    "all over" (Aug 11, 2012)  . Shortness of breath    when stressed.  . Sleep apnea    "never did fix my machine; haven't used one for 5 years; I've lost 116# since then; no problems now" (Aug 11, 2012)  . Type II diabetes mellitus ([place])    "borderline; don't test; take Metformin" (2012/08/11)    Past Surgical History:  Procedure Laterality Date  . ABDOMINAL HYSTERECTOMY  1990's  . GANGLION CYST EXCISION Right 1980's   "wrist" (2012-08-11)  . JOINT REPLACEMENT    . KNEE LIGAMENT  RECONSTRUCTION Right 1980's  . TONSILLECTOMY  ~ 1954  . TOTAL HIP ARTHROPLASTY Left 2012-08-11  . TOTAL HIP ARTHROPLASTY Left 08/11/2012   Procedure: TOTAL HIP ARTHROPLASTY;  Surgeon: Garald Balding, MD;  Location: [place];  Service: Orthopedics;  Laterality: Left;  Left Total Hip Arthroplasty  . TOTAL HIP ARTHROPLASTY Left 08/28/2012   Procedure: Irrigation and Debridement hip ;  Surgeon: Mcarthur Rossetti, MD;  Location: [place];  Service: Orthopedics;  Laterality: Left;  . TOTAL KNEE ARTHROPLASTY Left 2001  . TOTAL KNEE ARTHROPLASTY Right 2004    There were no vitals filed for this visit.   Subjective Assessment - 05/31/20 0939    Subjective No new complaints/concerns this AM and no fall since last tx.    Pertinent History Diabetic, chronic pain, bilat. TKA, left THA, MI, COPD-uses CPAP, right ankle fx., osteoporosis, obesity                             [facility] Adult PT Treatment/Exercise - 05/31/20 0001      Knee/Hip Exercises: Aerobic   Nustep L4 x 5 min UE/LE      Knee/Hip Exercises: Standing  Heel Raises Both;2 sets;10 reps    Forward Step Up Right;Left;1 set;Hand Hold: 2    Forward Step Up Limitations x12 ea. bilat.    Functional Squat Limitations mini squat at counter x 15 reps    Rocker Board 1 minute    Rocker Board Limitations dynamic lateral balance with bilat. hand support    Other Standing Knee Exercises Romberg feet apart on Aire[employer]    Other Standing Knee Exercises toe taps to 4 in. step x 20 with min assist      Knee/Hip Exercises: Seated   Long Arc Quad 2 sets;10 reps    Long Arc Quad Weight 4 lbs.    Clamshell with TheraBand Blue   2x10   Other Seated Knee/Hip Exercises seated marches x 20 reps    Hamstring Curl AROM;Strengthening;Right;Left;2 sets;10 reps    Hamstring Limitations blue band    Abduction/Adduction  --   hip adduction isometric with small ball x 15 reps                   PT Short Term Goals - 05/24/20 1116       PT SHORT TERM GOAL #1   Title Independent with initial/basic HEP    Baseline met    Time 2    Period Weeks    Status Achieved      PT SHORT TERM GOAL #2   Title Review pt. FOTO self report by visit 3    Baseline met    Time 2    Period Weeks    Status Achieved      PT SHORT TERM GOAL #3   Title Assess 5 times sit<>stand to establish baseline for improving ability for transfers    Baseline met    Time 2    Period Weeks    Status Achieved             PT Long Term Goals - 05/24/20 1117      PT LONG TERM GOAL #1   Title Improve FOTO outcome measure score to 43% functional status or greater    Baseline 45% on 05/15/20    Time 6    Period Weeks    Status Achieved      PT LONG TERM GOAL #2   Title Improve Tinetti Gait + Balance score at least 5 points to work towards decreased fall risk    Baseline met-revise goal for Tinetti score >19/28 for decreased fall risk    Time 6    Period Weeks    Status Revised    Target Date 07/05/20      PT LONG TERM GOAL #3   Title Improve bilat. LE strength at least grossly 1/2 MMT grade to improve ability for transfers from low chairs and bed mobility for sit<>supine    Baseline see objective    Time 6    Period Weeks    Status Partially Met    Target Date 07/05/20      PT LONG TERM GOAL #4   Title Improve 5 times sit<>stand time to 15 seconds or less as demonstration of improved functional LE strength and to improve transfer ability from low seats    Baseline 21 sec 05/24/20    Time 6    Period Weeks    Status New    Target Date 07/05/20                 Plan - 05/31/20 1007    Clinical Impression Statement  Improved safety for gait noted with walker use. Continued work on strengthening with addition closed chain activities as tolerated along with brief work on balance as well as LE open chain strengthening. Activity tolerance still limited by fatigue and pain but mild improvemets in exercise/activity tolerance from baseline.     Personal Factors and Comorbidities Time since onset of injury/illness/exacerbation;Comorbidity 3+    Comorbidities see PMH    Examination-Activity Limitations Dressing;Bathing;Transfers;Lift;Squat;Locomotion Level;Stairs;Stand;Carry;Bed Mobility    Examination-Participation Restrictions Meal Prep;Shop;Community Activity;Laundry;Cleaning    Stability/Clinical Decision Making Evolving/Moderate complexity    Clinical Decision Making Moderate    Rehab Potential Fair    PT Frequency 2x / week    PT Duration 6 weeks    PT Treatment/Interventions ADLs/Self Care Home Management;Cryotherapy;Electrical Stimulation;Iontophoresis 8m/ml Dexamethasone;Moist Heat;Therapeutic activities;Gait training;Therapeutic exercise;Patient/family education;Manual techniques;Dry needling;Balance training;Neuromuscular re-education;Functional mobility training;Stair training    PT Next Visit Plan Continue/progress LE strengthening as tolerated with open and closed chain exercises, continue balance/gait    PT Home Exercise Plan [id number] - patient instructed to modify HEP to perform exercises mainly sitting or supine until improved pain with standing    Consulted and Agree with Plan of Care Patient           Patient will benefit from skilled therapeutic intervention in order to improve the following deficits and impairments:  Pain,Impaired UE functional use,Decreased strength,Decreased activity tolerance,Abnormal gait,Decreased balance,Impaired sensation,Obesity,Difficulty walking,Decreased endurance  Visit Diagnosis: Other abnormalities of gait and mobility  Muscle weakness (generalized)  Difficulty in walking, not elsewhere classified  Pain in left hand  Pain in right hand     Problem List Patient Active Problem List   Diagnosis Date Noted  . Intertrigo 03/27/2020  . Anemia 08/23/2019  . Polyethylene liner wear following total hip arthroplasty requiring isolated polyethylene liner exchange ([place]  07/12/2019  . Adjustment disorder with mixed anxiety and depressed mood 07/12/2018  . Migraine without aura and without status migrainosus, not intractable 09/10/2017  . Post-traumatic osteoarthritis of right ankle 06/09/2016  . Spondylosis without myelopathy or radiculopathy, lumbar region 06/09/2016  . Cervical spinal stenosis 06/09/2016  . Spinal stenosis, lumbar region, without neurogenic claudication 06/09/2016  . Chronic gouty arthritis 06/04/2016  . Vitamin D deficiency 06/02/2016  . Hyperlipidemia 06/02/2016  . Closed right ankle fracture 05/01/2016  . Colon polyps 05/01/2016  . Fibromyalgia syndrome 05/01/2016  . Syncope 03/30/2016  . Chest pain 03/30/2016  . Fall 03/30/2016  . GERD with esophagitis 06/21/2015  . Pharyngeal dysphagia 06/21/2015  . Obesity 05/06/2013  . Acute nontraumatic kidney injury ([place] 05/05/2013  . Acute posthemorrhagic anemia 09/06/2012  . Gout 09/05/2012  . Osteoarthritis of left hip 08/05/2012  . Type 2 diabetes mellitus with diabetic polyneuropathy, without long-term current use of insulin ([place] 08/05/2012  . Hypertension 08/05/2012  . Asthma, chronic 08/05/2012  . Sleep apnea 08/05/2012    CBeaulah Dinning PT, DPT 05/31/20 10:23 AM  [facility] [place] [address]Phone: [phone]  Fax:  [phone] Name: Sandra APPLEMRN: [id number] of Birth: 412-Jan-1947

## 2020-06-03 ENCOUNTER — Other Ambulatory Visit: Payer: Self-pay | Admitting: Nurse Practitioner

## 2020-06-03 DIAGNOSIS — E559 Vitamin D deficiency, unspecified: Secondary | ICD-10-CM

## 2020-06-03 DIAGNOSIS — K2100 Gastro-esophageal reflux disease with esophagitis, without bleeding: Secondary | ICD-10-CM

## 2020-06-03 DIAGNOSIS — J4520 Mild intermittent asthma, uncomplicated: Secondary | ICD-10-CM

## 2020-06-05 ENCOUNTER — Other Ambulatory Visit: Payer: Self-pay

## 2020-06-05 ENCOUNTER — Encounter: Payer: Self-pay | Admitting: Physical Therapy

## 2020-06-05 ENCOUNTER — Ambulatory Visit: Payer: Medicare Other | Attending: Nurse Practitioner | Admitting: Physical Therapy

## 2020-06-05 DIAGNOSIS — M79641 Pain in right hand: Secondary | ICD-10-CM | POA: Insufficient documentation

## 2020-06-05 DIAGNOSIS — R2689 Other abnormalities of gait and mobility: Secondary | ICD-10-CM | POA: Diagnosis not present

## 2020-06-05 DIAGNOSIS — R262 Difficulty in walking, not elsewhere classified: Secondary | ICD-10-CM | POA: Diagnosis not present

## 2020-06-05 DIAGNOSIS — M6281 Muscle weakness (generalized): Secondary | ICD-10-CM | POA: Diagnosis not present

## 2020-06-05 DIAGNOSIS — M79642 Pain in left hand: Secondary | ICD-10-CM | POA: Insufficient documentation

## 2020-06-05 NOTE — Therapy (Signed)
[place], [place], [address] Phone: [phone]   Fax:  [phone]  Physical Therapy Treatment  Patient Details  Name: Sandra Brennan MRN: [id number] Date of Birth: 06-May-1945 Referring Provider (PT): Flossie Buffy, NP   Encounter Date: 06/05/2020   PT End of Session - 06/05/20 0951    Visit Number 12    Number of Visits 21    Date for PT Re-Evaluation 07/05/20    Authorization Type UHC MCR, MCD secondary, next progress note at visit 19 (progress note completed at visit 9)    Progress Note Due on Visit 79    PT Start Time 0940   arrived a few minutes late   PT Stop Time 1015    PT Time Calculation (min) 35 min    Activity Tolerance Patient limited by pain    Behavior During Therapy [facility] for tasks assessed/performed           Past Medical History:  Diagnosis Date  . Anemia   . Arthritis    "plenty" (08/06/2012)  . Asthma   . Chronic lower back pain    "they say I need a whole new spinal column" (August 06, 2012)  . COPD (chronic obstructive pulmonary disease) ([place])   . Degenerative arthritis    "all over" (August 06, 2012)  . Fx ankle   . GERD (gastroesophageal reflux disease)   . Gout 05/05/2013  . Hypertension   . Migraines    "used to; totally stopped when I quit drinking 30 yr ago" (06-Aug-2012)  . Myalgia and myositis   . Myocardial infarction [facility]) 1992   Aug 06, 2012 "mild MI when son died "  . Obesity   . Osteoporosis    "all over" (August 06, 2012)  . Shortness of breath    when stressed.  . Sleep apnea    "never did fix my machine; haven't used one for 5 years; I've lost 116# since then; no problems now" (08-06-2012)  . Type II diabetes mellitus ([place])    "borderline; don't test; take Metformin" (08-06-2012)    Past Surgical History:  Procedure Laterality Date  . ABDOMINAL HYSTERECTOMY  1990's  . GANGLION CYST EXCISION Right 1980's   "wrist" (August 06, 2012)  . JOINT REPLACEMENT    . KNEE LIGAMENT  RECONSTRUCTION Right 1980's  . TONSILLECTOMY  ~ 1954  . TOTAL HIP ARTHROPLASTY Left 2012/08/06  . TOTAL HIP ARTHROPLASTY Left Aug 06, 2012   Procedure: TOTAL HIP ARTHROPLASTY;  Surgeon: Garald Balding, MD;  Location: [place];  Service: Orthopedics;  Laterality: Left;  Left Total Hip Arthroplasty  . TOTAL HIP ARTHROPLASTY Left 08/28/2012   Procedure: Irrigation and Debridement hip ;  Surgeon: Mcarthur Rossetti, MD;  Location: [place];  Service: Orthopedics;  Laterality: Left;  . TOTAL KNEE ARTHROPLASTY Left 2001  . TOTAL KNEE ARTHROPLASTY Right 2004    There were no vitals filed for this visit.   Subjective Assessment - 06/05/20 0942    Subjective No falls since last visit. Patient continues to note significant RLE pain despite medication with "vibration sensation" now constant and associated RLE tightness. She does note pain seems to be worse with positoning into cervical extension when lying down-see assessment.    Pertinent History Diabetic, chronic pain, bilat. TKA, left THA, MI, COPD-uses CPAP, right ankle fx., osteoporosis, obesity    Currently in Pain? Yes    Pain Score 10-Worst pain ever    Pain Location Leg    Pain Orientation Right    Pain Descriptors /  Indicators Sharp   "vibration"   Pain Type Chronic pain    Pain Radiating Towards right hpi distally down right leg    Pain Onset More than a month ago    Pain Frequency Constant    Aggravating Factors  standing and walking    Pain Relieving Factors bending and straightening out right knee, rest, sometimes leaning forward    Effect of Pain on Daily Activities limits activity and positional tolerance              [facility] PT Assessment - 06/05/20 0001      Special Tests   Other special tests Hoffman's and Inverted supinator sign (-) bilat., right SLR (-)                         [facility] Adult PT Treatment/Exercise - 06/05/20 0001      Knee/Hip Exercises: Seated   Long Arc Quad 2 sets;10 reps    Long Arc Quad  Weight 4 lbs.    Clamshell with TheraBand Blue   2x10   Hamstring Curl AROM;Strengthening;Right;Left;2 sets;10 reps    Hamstring Limitations blue band    Abduction/Adduction  --   hip adduction isometric with small ball x 15 reps     Knee/Hip Exercises: Supine   Other Supine Knee/Hip Exercises SKTC with therapist assistance 10 sec x 5 ea. bilat. with ROM </= 90 deg on left due to past history posterior THA, posterior pelvic tilt x 10 reps   supine on incline wedge   Other Supine Knee/Hip Exercises DKTC AAROM with legs on 55 cm P-ball x10 reps   supine on incline wedge                 PT Education - 06/05/20 1118    Education Details potential symptom etiology RLE symptoms, HEP (trial right SKTC for RLE radiating pain)(    Person(s) Educated Patient    Methods Explanation;Demonstration;Verbal cues    Comprehension Verbalized understanding;Returned demonstration            PT Short Term Goals - 05/24/20 1116      PT SHORT TERM GOAL #1   Title Independent with initial/basic HEP    Baseline met    Time 2    Period Weeks    Status Achieved      PT SHORT TERM GOAL #2   Title Review pt. FOTO self report by visit 3    Baseline met    Time 2    Period Weeks    Status Achieved      PT SHORT TERM GOAL #3   Title Assess 5 times sit<>stand to establish baseline for improving ability for transfers    Baseline met    Time 2    Period Weeks    Status Achieved             PT Long Term Goals - 05/24/20 1117      PT LONG TERM GOAL #1   Title Improve FOTO outcome measure score to 43% functional status or greater    Baseline 45% on 05/15/20    Time 6    Period Weeks    Status Achieved      PT LONG TERM GOAL #2   Title Improve Tinetti Gait + Balance score at least 5 points to work towards decreased fall risk    Baseline met-revise goal for Tinetti score >19/28 for decreased fall risk    Time 6  Period Weeks    Status Revised    Target Date 07/05/20      PT LONG  TERM GOAL #3   Title Improve bilat. LE strength at least grossly 1/2 MMT grade to improve ability for transfers from low chairs and bed mobility for sit<>supine    Baseline see objective    Time 6    Period Weeks    Status Partially Met    Target Date 07/05/20      PT LONG TERM GOAL #4   Title Improve 5 times sit<>stand time to 15 seconds or less as demonstration of improved functional LE strength and to improve transfer ability from low seats    Baseline 21 sec 05/24/20    Time 6    Period Weeks    Status New    Target Date 07/05/20                 Plan - 06/05/20 0951    Clinical Impression Statement Checked special tests again to screen for cervical myelopathy given report of leg pain worse with cervical extension but tests (-) and SLR also (-). Pt. does have history of chronic LBP treated in the past with injections with MRI findings from 08/12/19 of moderate to severe stenosis and severe facet hypertrophy so differential diagnosis could include lumbar radiculopathy. Brief trial flexoin bias ROM with SKTC with mild ease of right LE symptoms noted. Continue to recommend MD follow up for further assessment. Status/progress still ongoing with therapy goals with high pain level still as limiting factor.    Personal Factors and Comorbidities Time since onset of injury/illness/exacerbation;Comorbidity 3+    Comorbidities see PMH    Examination-Activity Limitations Dressing;Bathing;Transfers;Lift;Squat;Locomotion Level;Stairs;Stand;Carry;Bed Mobility    Examination-Participation Restrictions Meal Prep;Shop;Community Activity;Laundry;Cleaning    Stability/Clinical Decision Making Evolving/Moderate complexity    Clinical Decision Making Moderate    Rehab Potential Fair    PT Frequency 2x / week    PT Duration 6 weeks    PT Treatment/Interventions ADLs/Self Care Home Management;Cryotherapy;Electrical Stimulation;Iontophoresis 3m/ml Dexamethasone;Moist Heat;Therapeutic activities;Gait  training;Therapeutic exercise;Patient/family education;Manual techniques;Dry needling;Balance training;Neuromuscular re-education;Functional mobility training;Stair training    PT Next Visit Plan Check response addition right SKTC to HEP if any ease of right leg symptoms, continue/progress LE strengthening as tolerated with open and closed chain exercises, continue balance/gait    PT Home Exercise Plan [id number] - patient instructed to modify HEP to perform exercises mainly sitting or supine until improved pain with standing    Consulted and Agree with Plan of Care Patient           Patient will benefit from skilled therapeutic intervention in order to improve the following deficits and impairments:  Pain,Impaired UE functional use,Decreased strength,Decreased activity tolerance,Abnormal gait,Decreased balance,Impaired sensation,Obesity,Difficulty walking,Decreased endurance  Visit Diagnosis: Other abnormalities of gait and mobility  Muscle weakness (generalized)  Difficulty in walking, not elsewhere classified  Pain in left hand  Pain in right hand     Problem List Patient Active Problem List   Diagnosis Date Noted  . Intertrigo 03/27/2020  . Anemia 08/23/2019  . Polyethylene liner wear following total hip arthroplasty requiring isolated polyethylene liner exchange ([place] 07/12/2019  . Adjustment disorder with mixed anxiety and depressed mood 07/12/2018  . Migraine without aura and without status migrainosus, not intractable 09/10/2017  . Post-traumatic osteoarthritis of right ankle 06/09/2016  . Spondylosis without myelopathy or radiculopathy, lumbar region 06/09/2016  . Cervical spinal stenosis 06/09/2016  . Spinal stenosis, lumbar region, without neurogenic claudication 06/09/2016  .  Chronic gouty arthritis 06/04/2016  . Vitamin D deficiency 06/02/2016  . Hyperlipidemia 06/02/2016  . Closed right ankle fracture 05/01/2016  . Colon polyps 05/01/2016  . Fibromyalgia syndrome  05/01/2016  . Syncope 03/30/2016  . Chest pain 03/30/2016  . Fall 03/30/2016  . GERD with esophagitis 06/21/2015  . Pharyngeal dysphagia 06/21/2015  . Obesity 05/06/2013  . Acute nontraumatic kidney injury ([place]) 05/05/2013  . Acute posthemorrhagic anemia 09/06/2012  . Gout 09/05/2012  . Osteoarthritis of left hip 08/05/2012  . Type 2 diabetes mellitus with diabetic polyneuropathy, without long-term current use of insulin ([place]) 08/05/2012  . Hypertension 08/05/2012  . Asthma, chronic 08/05/2012  . Sleep apnea 08/05/2012    Beaulah Dinning, PT, DPT 06/05/20 11:21 AM  [facility] [address] [place], [place], [address] Phone: [phone]   Fax:  [phone]  Name: MERRIT WAUGH MRN: [id number] Date of Birth: [birth year]

## 2020-06-07 ENCOUNTER — Other Ambulatory Visit: Payer: Self-pay

## 2020-06-07 ENCOUNTER — Ambulatory Visit: Payer: Medicare Other | Admitting: Physical Therapy

## 2020-06-07 ENCOUNTER — Encounter: Payer: Self-pay | Admitting: Physical Therapy

## 2020-06-07 DIAGNOSIS — R2689 Other abnormalities of gait and mobility: Secondary | ICD-10-CM | POA: Diagnosis not present

## 2020-06-07 DIAGNOSIS — M6281 Muscle weakness (generalized): Secondary | ICD-10-CM | POA: Diagnosis not present

## 2020-06-07 DIAGNOSIS — R262 Difficulty in walking, not elsewhere classified: Secondary | ICD-10-CM | POA: Diagnosis not present

## 2020-06-07 DIAGNOSIS — M79641 Pain in right hand: Secondary | ICD-10-CM | POA: Diagnosis not present

## 2020-06-07 DIAGNOSIS — M79642 Pain in left hand: Secondary | ICD-10-CM

## 2020-06-07 NOTE — Therapy (Signed)
[place], [place], [address] Phone: [phone]   Fax:  [phone]  Physical Therapy Treatment  Patient Details  Name: Sandra Brennan MRN: [id number] Date of Birth: [birth year] Referring Provider (PT): Flossie Buffy, NP   Encounter Date: 06/07/2020   PT End of Session - 06/07/20 1022    Visit Number 13    Number of Visits 21    Date for PT Re-Evaluation 07/05/20    Authorization Type UHC MCR, MCD secondary, next progress note at visit 19 (progress note completed at visit 9)    PT Start Time [phone]   arrived late   PT Stop Time 1017    PT Time Calculation (min) 39 min    Activity Tolerance Patient limited by pain;Patient limited by fatigue    Behavior During Therapy [facility] for tasks assessed/performed           Past Medical History:  Diagnosis Date  . Anemia   . Arthritis    "plenty" (08-11-12)  . Asthma   . Chronic lower back pain    "they say I need a whole new spinal column" (Aug 11, 2012)  . COPD (chronic obstructive pulmonary disease) ([place])   . Degenerative arthritis    "all over" (08-11-2012)  . Fx ankle   . GERD (gastroesophageal reflux disease)   . Gout 05/05/2013  . Hypertension   . Migraines    "used to; totally stopped when I quit drinking 30 yr ago" (11-Aug-2012)  . Myalgia and myositis   . Myocardial infarction [facility]) 1992   08/11/12 "mild MI when son died "  . Obesity   . Osteoporosis    "all over" (08/11/12)  . Shortness of breath    when stressed.  . Sleep apnea    "never did fix my machine; haven't used one for 5 years; I've lost 116# since then; no problems now" (Aug 11, 2012)  . Type II diabetes mellitus ([place])    "borderline; don't test; take Metformin" (08/11/12)    Past Surgical History:  Procedure Laterality Date  . ABDOMINAL HYSTERECTOMY  1990's  . GANGLION CYST EXCISION Right 1980's   "wrist" (2012/08/11)  . JOINT REPLACEMENT    . KNEE LIGAMENT RECONSTRUCTION Right 1980's   . TONSILLECTOMY  ~ 1954  . TOTAL HIP ARTHROPLASTY Left Aug 11, 2012  . TOTAL HIP ARTHROPLASTY Left 08/11/12   Procedure: TOTAL HIP ARTHROPLASTY;  Surgeon: Garald Balding, MD;  Location: [place];  Service: Orthopedics;  Laterality: Left;  Left Total Hip Arthroplasty  . TOTAL HIP ARTHROPLASTY Left 08/28/2012   Procedure: Irrigation and Debridement hip ;  Surgeon: Mcarthur Rossetti, MD;  Location: [place];  Service: Orthopedics;  Laterality: Left;  . TOTAL KNEE ARTHROPLASTY Left 2001  . TOTAL KNEE ARTHROPLASTY Right 2004    There were no vitals filed for this visit.   Subjective Assessment - 06/07/20 0941    Subjective Pt. reports tried right SKTC stretch on recliner with mild temporary ease RLE symptoms. Overall with therapy she reports mild improvement with right hip and with RLE "vibrating" symptoms but (symptoms) still limiting activity tolerance.    Pertinent History Diabetic, chronic pain, bilat. TKA, left THA, MI, COPD-uses CPAP, right ankle fx., osteoporosis, obesity                             [facility] Adult PT Treatment/Exercise - 06/07/20 0001      Knee/Hip Exercises: Stretches   Other  Knee/Hip Stretches R SKTC 5 x 10 sec holds      Knee/Hip Exercises: Aerobic   Nustep L3 x 5 min UE/LE      Knee/Hip Exercises: Standing   Heel Raises Both;1 set;15 reps    Heel Raises Limitations on Airex    Forward Step Up Right;Left;10 reps;Hand Hold: 2;Step Height: 4"    Functional Squat 10 reps    Functional Squat Limitations partial squat at counter    Rocker Board 1 minute    Rocker Board Limitations dynamic lateral balance at counter with bilat. hand support    Other Standing Knee Exercises Romberg eyes open/closed 3x10 sec ea. feet apart on Airex      Knee/Hip Exercises: Supine   Short Arc Quad Sets AROM;Strengthening;Both;2 sets;10 reps    Short Arc Quad Sets Limitations 2 lbs.    Other Supine Knee/Hip Exercises clamshell blue band 2x10      Manual Therapy    Manual Therapy Joint mobilization    Joint Mobilization LAD right hip grade I-III oscillations                    PT Short Term Goals - 05/24/20 1116      PT SHORT TERM GOAL #1   Title Independent with initial/basic HEP    Baseline met    Time 2    Period Weeks    Status Achieved      PT SHORT TERM GOAL #2   Title Review pt. FOTO self report by visit 3    Baseline met    Time 2    Period Weeks    Status Achieved      PT SHORT TERM GOAL #3   Title Assess 5 times sit<>stand to establish baseline for improving ability for transfers    Baseline met    Time 2    Period Weeks    Status Achieved             PT Long Term Goals - 05/24/20 1117      PT LONG TERM GOAL #1   Title Improve FOTO outcome measure score to 43% functional status or greater    Baseline 45% on 05/15/20    Time 6    Period Weeks    Status Achieved      PT LONG TERM GOAL #2   Title Improve Tinetti Gait + Balance score at least 5 points to work towards decreased fall risk    Baseline met-revise goal for Tinetti score >19/28 for decreased fall risk    Time 6    Period Weeks    Status Revised    Target Date 07/05/20      PT LONG TERM GOAL #3   Title Improve bilat. LE strength at least grossly 1/2 MMT grade to improve ability for transfers from low chairs and bed mobility for sit<>supine    Baseline see objective    Time 6    Period Weeks    Status Partially Met    Target Date 07/05/20      PT LONG TERM GOAL #4   Title Improve 5 times sit<>stand time to 15 seconds or less as demonstration of improved functional LE strength and to improve transfer ability from low seats    Baseline 21 sec 05/24/20    Time 6    Period Weeks    Status New    Target Date 07/05/20  Plan - 06/07/20 1027    Clinical Impression Statement Added LAD to right hip for manual therapy given underlying hip OA and also for decompressive effect into lumbar spine with good tolerance/mild symptom  ease. Mild improvements as noted in subjective but still overall with functional limitations for walking tolerance and decreased balance with recent falls though no new falls with recent use rollator instead of cane. Still unclear differential diagnosis for RLE symptoms but would suspect potential underlying lumbar radiculopathy given history of stenosis (and ease of symptoms with rest and trunk flexion) vs. hip OA. Today was pt.'s last scheduled visit-discussed status and she wishes to contact orthopedist vs. referring provider for further follow up/advice and will contact clinic to schedule further visits if needed.    Personal Factors and Comorbidities Time since onset of injury/illness/exacerbation;Comorbidity 3+    Comorbidities see PMH    Examination-Activity Limitations Dressing;Bathing;Transfers;Lift;Squat;Locomotion Level;Stairs;Stand;Carry;Bed Mobility    Examination-Participation Restrictions Meal Prep;Shop;Community Activity;Laundry;Cleaning    Stability/Clinical Decision Making Evolving/Moderate complexity    Clinical Decision Making Moderate    Rehab Potential Fair    PT Frequency 2x / week    PT Duration 6 weeks    PT Treatment/Interventions ADLs/Self Care Home Management;Cryotherapy;Electrical Stimulation;Iontophoresis 60m/ml Dexamethasone;Moist Heat;Therapeutic activities;Gait training;Therapeutic exercise;Patient/family education;Manual techniques;Dry needling;Balance training;Neuromuscular re-education;Functional mobility training;Stair training    PT Next Visit Plan caution: fall risk and osteoporosis, If returning continued progress LE strengthening with open and closed chain activities as tolerated, continue work on balance/gait and continue gentle hip distraction if benefit noted from today's performance of this    PT Home Exercise Plan [id number] - patient instructed to modify HEP to perform exercises mainly sitting or supine until improved pain with standing    Consulted and Agree  with Plan of Care Patient           Patient will benefit from skilled therapeutic intervention in order to improve the following deficits and impairments:  Pain,Impaired UE functional use,Decreased strength,Decreased activity tolerance,Abnormal gait,Decreased balance,Impaired sensation,Obesity,Difficulty walking,Decreased endurance  Visit Diagnosis: Other abnormalities of gait and mobility  Muscle weakness (generalized)  Difficulty in walking, not elsewhere classified  Pain in left hand  Pain in right hand     Problem List Patient Active Problem List   Diagnosis Date Noted  . Intertrigo 03/27/2020  . Anemia 08/23/2019  . Polyethylene liner wear following total hip arthroplasty requiring isolated polyethylene liner exchange ([place] 07/12/2019  . Adjustment disorder with mixed anxiety and depressed mood 07/12/2018  . Migraine without aura and without status migrainosus, not intractable 09/10/2017  . Post-traumatic osteoarthritis of right ankle 06/09/2016  . Spondylosis without myelopathy or radiculopathy, lumbar region 06/09/2016  . Cervical spinal stenosis 06/09/2016  . Spinal stenosis, lumbar region, without neurogenic claudication 06/09/2016  . Chronic gouty arthritis 06/04/2016  . Vitamin D deficiency 06/02/2016  . Hyperlipidemia 06/02/2016  . Closed right ankle fracture 05/01/2016  . Colon polyps 05/01/2016  . Fibromyalgia syndrome 05/01/2016  . Syncope 03/30/2016  . Chest pain 03/30/2016  . Fall 03/30/2016  . GERD with esophagitis 06/21/2015  . Pharyngeal dysphagia 06/21/2015  . Obesity 05/06/2013  . Acute nontraumatic kidney injury ([place] 05/05/2013  . Acute posthemorrhagic anemia 09/06/2012  . Gout 09/05/2012  . Osteoarthritis of left hip 08/05/2012  . Type 2 diabetes mellitus with diabetic polyneuropathy, without long-term current use of insulin ([place] 08/05/2012  . Hypertension 08/05/2012  . Asthma, chronic 08/05/2012  . Sleep apnea 08/05/2012     CBeaulah Dinning PT, DPT 06/07/20 11:10 AM  [place]  [facility] [address] [place], [place], [address] Phone: [phone]   Fax:  [phone]  Name: BREONIA KIRSTEIN MRN: [id number] Date of Birth: Sep 10, 1945

## 2020-06-11 DIAGNOSIS — G4733 Obstructive sleep apnea (adult) (pediatric): Secondary | ICD-10-CM | POA: Diagnosis not present

## 2020-06-25 ENCOUNTER — Other Ambulatory Visit: Payer: Self-pay

## 2020-06-26 ENCOUNTER — Ambulatory Visit (INDEPENDENT_AMBULATORY_CARE_PROVIDER_SITE_OTHER): Payer: Medicare Other | Admitting: Nurse Practitioner

## 2020-06-26 ENCOUNTER — Encounter: Payer: Self-pay | Admitting: Nurse Practitioner

## 2020-06-26 VITALS — BP 154/90 | HR 84 | Temp 97.5°F | Ht 59.0 in | Wt 269.0 lb

## 2020-06-26 DIAGNOSIS — I10 Essential (primary) hypertension: Secondary | ICD-10-CM

## 2020-06-26 DIAGNOSIS — E782 Mixed hyperlipidemia: Secondary | ICD-10-CM | POA: Diagnosis not present

## 2020-06-26 DIAGNOSIS — E1142 Type 2 diabetes mellitus with diabetic polyneuropathy: Secondary | ICD-10-CM

## 2020-06-26 DIAGNOSIS — F339 Major depressive disorder, recurrent, unspecified: Secondary | ICD-10-CM | POA: Diagnosis not present

## 2020-06-26 DIAGNOSIS — Z6841 Body Mass Index (BMI) 40.0 and over, adult: Secondary | ICD-10-CM

## 2020-06-26 MED ORDER — LISINOPRIL-HYDROCHLOROTHIAZIDE 20-12.5 MG PO TABS: 1.0000 | ORAL_TABLET | Freq: Every day | ORAL | 1 refills | Status: DC

## 2020-06-26 NOTE — Assessment & Plan Note (Signed)
BP not at goal BP Readings from Last 3 Encounters:  06/26/20 (!) 142/80  05/29/20 (!) 149/69  05/28/20 123/70   Increase lisinopril dose to 20/12.5mg  Advised about the importance of low sodium diet F/up in 30month

## 2020-06-26 NOTE — Patient Instructions (Addendum)
I changed lisinopril/HCTZ dose to 20/12.5 F/up in 76month  Use biotene for dry mouth.

## 2020-06-26 NOTE — Progress Notes (Signed)
Subjective:  Patient ID: Sandra Brennan, female    DOB: 1945-12-08  Age: 75 y.o. MRN: [id number]  CC: Follow-up (3 month f/u on DM and HTN/Checks BP at home and states BP is good unless she has a headache. )  HPI  Depression, recurrent ([place]) Concerned about mother's health and taking care of her grandchildren. Concerned about difficulty with weight loss, so avoids to eat food. She admits she thinks she is depressed, but does not need referral to psychology nor use of medication.  Advised to follow DASH diet, eat small frequent meals and increase daily activity. Entered referral to nutritionist. F/up in 43month  Hypertension BP not at goal BP Readings from Last 3 Encounters:  06/26/20 (!) 142/80  05/29/20 (!) 149/69  05/28/20 123/70   Increase lisinopril dose to 20/12.5mg  Advised about the importance of low sodium diet F/up in 65month  Wt Readings from Last 3 Encounters:  06/26/20 269 lb (122 kg)  05/28/20 293 lb 6.4 oz (133.1 kg)  05/23/20 291 lb 12.8 oz (132.4 kg)   BP Readings from Last 3 Encounters:  06/26/20 (!) 154/90  05/29/20 (!) 149/69  05/28/20 123/70   Reviewed past Medical, Social and Family history today.  Outpatient Medications Prior to Visit  Medication Sig Dispense Refill  . albuterol (VENTOLIN HFA) 108 (90 Base) MCG/ACT inhaler INHALE 1-2 PUFFS INTO THE LUNGS EVERY 6 (SIX) HOURS AS NEEDED FOR WHEEZING OR SHORTNESS OF BREATH 6.7 each 0  . allopurinol (ZYLOPRIM) 100 MG tablet TAKE 1 TABLET BY MOUTH EVERY DAY 90 tablet 3  . aspirin EC 81 MG tablet Take 81 mg by mouth daily.    Marland Kitchen atorvastatin (LIPITOR) 10 MG tablet TAKE 1 TABLET (10 MG TOTAL) BY MOUTH DAILY AT 6 PM. 90 tablet 3  . Carboxymethylcellulose Sodium (EYE DROPS OP) Apply to eye. Lubricant eye drops    . cetirizine (ZYRTEC) 10 MG tablet Take 10 mg by mouth daily.    . cycloSPORINE (RESTASIS) 0.05 % ophthalmic emulsion Restasis 0.05 % eye drops in a dropperette    . Multiple Vitamin (MULTIVITAMIN)  tablet Take 1 tablet by mouth daily.    Marland Kitchen nystatin (MYCOSTATIN/NYSTOP) powder Apply 1 application topically 3 (three) times daily. 60 g 5  . omeprazole (PRILOSEC) 20 MG capsule TAKE 1 CAPSULE BY MOUTH EVERY DAY 90 capsule 1  . OVER THE COUNTER MEDICATION Cystic magnesium    . pregabalin (LYRICA) 150 MG capsule Take 1 capsule (150 mg total) by mouth 2 (two) times daily. 60 capsule 5  . Sennosides 8.6 MG CAPS Take by mouth.    . SYMBICORT 160-4.5 MCG/ACT inhaler INHALE 2 PUFFS INTO THE LUNGS IN THE MORNING AND AT BEDTIME. RINSE MOUTH AFTER EACH USE 30.6 Inhaler 7  . traMADol (ULTRAM) 50 MG tablet Take 1 tablet (50 mg total) by mouth 3 (three) times daily. 90 tablet 5  . Vitamin D, Ergocalciferol, (DRISDOL) 1.25 MG (50000 UNIT) CAPS capsule TAKE 1 CAPSULE (50,000 UNITS TOTAL) BY MOUTH EVERY 7 (SEVEN) DAYS 5 capsule 0  . lisinopril-hydrochlorothiazide (ZESTORETIC) 10-12.5 MG tablet TAKE 1 TABLET BY MOUTH EVERY DAY 90 tablet 1  . naproxen (NAPROSYN) 500 MG tablet Take 1 tablet (500 mg total) by mouth 2 (two) times daily with a meal. 10 tablet 0  . Ubrogepant (UBRELVY) 50 MG TABS Take 50 mg by mouth as needed (take 1 at onset of headache, may repeat once 2 hours after initial dosing). (Patient not taking: No sig reported) 12 tablet 11  No facility-administered medications prior to visit.    ROS See HPI  Objective:  BP (!) 154/90 (BP Location: Right Arm, Patient Position: Sitting, Cuff Size: Large)   Pulse 84   Temp (!) 97.5 F (36.4 C) (Temporal)   Ht 4\' 11"  (1.499 m)   Wt 269 lb (122 kg)   SpO2 96%   BMI 54.33 kg/m   Physical Exam Vitals reviewed.  Constitutional:      Appearance: She is obese.  Cardiovascular:     Rate and Rhythm: Normal rate and regular rhythm.     Pulses: Normal pulses.     Heart sounds: Normal heart sounds.  Pulmonary:     Effort: Pulmonary effort is normal.     Breath sounds: Normal breath sounds.  Musculoskeletal:     Right lower leg: Edema present.      Left lower leg: Edema present.  Neurological:     Mental Status: She is alert and oriented to person, place, and time.     Assessment & Plan:  This visit occurred during the SARS-CoV-2 public health emergency.  Safety protocols were in place, including screening questions prior to the visit, additional usage of staff PPE, and extensive cleaning of exam room while observing appropriate contact time as indicated for disinfecting solutions.   Sandra Brennan was seen today for follow-up.  Diagnoses and all orders for this visit:  Essential hypertension -     lisinopril-hydrochlorothiazide (ZESTORETIC) 20-12.5 MG tablet; Take 1 tablet by mouth daily. -     Referral to Nutrition and Diabetes Services  Depression, recurrent ([place]) -     Referral to Nutrition and Diabetes Services  Type 2 diabetes mellitus with diabetic polyneuropathy, without long-term current use of insulin ([place]) -     Referral to Nutrition and Diabetes Services  Mixed hyperlipidemia -     Referral to Nutrition and Diabetes Services  Class 3 severe obesity due to excess calories with serious comorbidity and body mass index (BMI) of 50.0 to 59.9 in adult [facility]) -     Referral to Nutrition and Diabetes Services  Primary hypertension    Problem List Items Addressed This Visit      Cardiovascular and Mediastinum   Hypertension (Chronic)    BP not at goal BP Readings from Last 3 Encounters:  06/26/20 (!) 142/80  05/29/20 (!) 149/69  05/28/20 123/70   Increase lisinopril dose to 20/12.5mg  Advised about the importance of low sodium diet F/up in 74month      Relevant Medications   lisinopril-hydrochlorothiazide (ZESTORETIC) 20-12.5 MG tablet     Endocrine   Type 2 diabetes mellitus with diabetic polyneuropathy, without long-term current use of insulin (HCC) (Chronic)   Relevant Medications   lisinopril-hydrochlorothiazide (ZESTORETIC) 20-12.5 MG tablet   Other Relevant Orders   Referral to Nutrition and Diabetes  Services     Other   Depression, recurrent ([place])    Concerned about mother's health and taking care of her grandchildren. Concerned about difficulty with weight loss, so avoids to eat food. She admits she thinks she is depressed, but does not need referral to psychology nor use of medication.  Advised to follow DASH diet, eat small frequent meals and increase daily activity. Entered referral to nutritionist. F/up in 66month      Relevant Orders   Referral to Nutrition and Diabetes Services   Hyperlipidemia   Relevant Medications   lisinopril-hydrochlorothiazide (ZESTORETIC) 20-12.5 MG tablet   Other Relevant Orders   Referral to Nutrition and Diabetes Services  Obesity   Relevant Orders   Referral to Nutrition and Diabetes Services    Other Visit Diagnoses    Essential hypertension    -  Primary   Relevant Medications   lisinopril-hydrochlorothiazide (ZESTORETIC) 20-12.5 MG tablet   Other Relevant Orders   Referral to Nutrition and Diabetes Services      Follow-up: Return in about 4 weeks (around 07/24/2020) for HTN and weight loss (37mins).  Wilfred Lacy, NP

## 2020-06-26 NOTE — Assessment & Plan Note (Signed)
Concerned about mother's health and taking care of her grandchildren. Concerned about difficulty with weight loss, so avoids to eat food. She admits she thinks she is depressed, but does not need referral to psychology nor use of medication.  Advised to follow DASH diet, eat small frequent meals and increase daily activity. Entered referral to nutritionist. F/up in 33month

## 2020-07-07 ENCOUNTER — Other Ambulatory Visit: Payer: Self-pay | Admitting: Nurse Practitioner

## 2020-07-07 DIAGNOSIS — J4520 Mild intermittent asthma, uncomplicated: Secondary | ICD-10-CM

## 2020-07-07 DIAGNOSIS — I10 Essential (primary) hypertension: Secondary | ICD-10-CM

## 2020-07-07 DIAGNOSIS — E559 Vitamin D deficiency, unspecified: Secondary | ICD-10-CM

## 2020-07-11 ENCOUNTER — Other Ambulatory Visit: Payer: Self-pay | Admitting: Nurse Practitioner

## 2020-07-11 DIAGNOSIS — E559 Vitamin D deficiency, unspecified: Secondary | ICD-10-CM

## 2020-07-13 DIAGNOSIS — G4733 Obstructive sleep apnea (adult) (pediatric): Secondary | ICD-10-CM | POA: Diagnosis not present

## 2020-07-16 ENCOUNTER — Other Ambulatory Visit: Payer: Self-pay | Admitting: Nurse Practitioner

## 2020-07-16 DIAGNOSIS — E559 Vitamin D deficiency, unspecified: Secondary | ICD-10-CM

## 2020-07-23 ENCOUNTER — Other Ambulatory Visit: Payer: Self-pay | Admitting: Neurology

## 2020-07-23 ENCOUNTER — Encounter: Payer: Self-pay | Admitting: Neurology

## 2020-07-23 ENCOUNTER — Ambulatory Visit (INDEPENDENT_AMBULATORY_CARE_PROVIDER_SITE_OTHER): Payer: Medicare Other | Admitting: Neurology

## 2020-07-23 ENCOUNTER — Other Ambulatory Visit: Payer: Self-pay

## 2020-07-23 VITALS — BP 140/68 | HR 79 | Ht 59.0 in | Wt 295.0 lb

## 2020-07-23 DIAGNOSIS — G43009 Migraine without aura, not intractable, without status migrainosus: Secondary | ICD-10-CM | POA: Diagnosis not present

## 2020-07-23 MED ORDER — UBRELVY 50 MG PO TABS: 50.0000 mg | ORAL_TABLET | ORAL | 11 refills | Status: DC | PRN

## 2020-07-23 NOTE — Patient Instructions (Signed)
Try the Ubrelvy at  onset of headache, take 1 tablet, may repeat 2 hours later, max is 200 mg in 24 hours Check labs today  Call if headaches don't worsen  See you back in 6 months

## 2020-07-23 NOTE — Progress Notes (Signed)
PATIENT: Sandra Brennan DOB: February 24, 1946  REASON FOR VISIT: follow up HISTORY FROM: patient  HISTORY OF PRESENT ILLNESS: Today 07/23/20  Sandra Brennan is a 75 year old female with history of chronic migraine headaches and OSA on CPAP.  Has not been able to tolerate Topamax, nortriptyline.  Already on daily Lyrica and tramadol for chronic pain, doses increased to help with general body pain, is helping some. Has been in PT for falling, now using rolling walker. Started getting vibration sensation in hips and legs, goes down to her legs, comes and goes. Seeing pain management, PCP. Headaches are left sided, stabbing, afterwards head is sore to touch, happening once a week. 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 Taking Tylenol Extra Strength, calms it. Feels sensitive to sound with headache. Weight is up 295 lbs, up 8 lbs. Going to healthy weight and wellness center in June. Needs to see dentist, has top and bottom set that are poorly fitting. Headaches have improved since last seen.   Update 01/23/2020 SS: Sandra Brennan is a 75 year old female with history of chronic migraine headaches and OSA on CPAP. Could not tolerate Topamax for headaches secondary to fatigue, nortriptyline was discontinued due to possible interaction with tramadol.  Is taking tramadol and Lyrica for chronic pain, fibromyalgia.  Of recent, has been under more stress, more frequent headaches, once a week, but may last 1-3 days, Tylenol is not helpful. When stress is well controlled, headaches do well.  In the last year, gained about 15 pounds, has not been exercising.  Has asthma, more SOB with exertion, using inhaler.  Headaches are on the left temporal area, caring for her mother, has Alzheimer's, takes care of her grandkids.  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  Recent CPAP download indicates good compliance, used 29/30 days, 26/30 days greater than 4 hours (87%),  average usage days used 6 hours 40 minutes, set pressure 6 cm water, leak 95th percentile 26.7, AHI 4.1. Uses nasal mask, felt to have leaking in tubing, just got new tubing about 2 weeks ago. Still has dry mouth, but sleeps with mouth open. Here today for follow-up unaccompanied.  HISTORY Some 07/19/2019 JM: Sandra Brennan 75 year old female with history of chronic migraine headaches and OSA on CPAP.  When last seen she was started on Topamax.  She reports ongoing headaches approximately 2-3 times monthly typically located left temporal and frontal with pressure type sensation.  At times, incontinent of phonophobia and blurred vision but no other associated features.  She self discontinued Topamax due to increased fatigue and "feeling out of it".  At times, headache will subside with use of Tylenol but other times headaches can be debilitating without benefit of Tylenol.  She does report worsening headaches with increased stressors but at times can occur without aggravating factors.  She endorses ongoing compliance with CPAP for OSA management with compliance report from 06/18/2019 -[id number] or 30 usage days with 29 days greater than 4 hours for 97% compliance with residual AHI 1.2.  Set pressure at 6 cm H2O with EPR level 1.  Leaks in the 95th percentile 26.5.  She does report frequently feeling leaks coming from her tubing and recently replaced tubing with only short-term benefit.  Also complains of continued dry mouth and did not increase humidity as previously recommended at prior visit.   REVIEW OF SYSTEMS: Out of a complete 14 system review of symptoms, the patient complains only of the  following symptoms, and all other reviewed systems are negative.  Headache, sleep apnea  ALLERGIES: Allergies  Allergen Reactions  . Cymbalta [Duloxetine Hcl] Anaphylaxis  . Pineapple Shortness Of Breath  . Influenza Vaccines Hives and Itching  . Penicillins Hives  . Sulfa Antibiotics Other (See Comments)     REACTION: unknown  . Theophyllines Hives    HOME MEDICATIONS: Outpatient Medications Prior to Visit  Medication Sig Dispense Refill  . albuterol (VENTOLIN HFA) 108 (90 Base) MCG/ACT inhaler INHALE 1-2 PUFFS INTO THE LUNGS EVERY 6 (SIX) HOURS AS NEEDED FOR WHEEZING OR SHORTNESS OF BREATH 6.7 each 0  . allopurinol (ZYLOPRIM) 100 MG tablet TAKE 1 TABLET BY MOUTH EVERY DAY 90 tablet 3  . aspirin EC 81 MG tablet Take 81 mg by mouth daily.    Marland Kitchen atorvastatin (LIPITOR) 10 MG tablet TAKE 1 TABLET (10 MG TOTAL) BY MOUTH DAILY AT 6 PM. 90 tablet 3  . Carboxymethylcellulose Sodium (EYE DROPS OP) Apply to eye. Lubricant eye drops    . cetirizine (ZYRTEC) 10 MG tablet Take 10 mg by mouth daily.    . cycloSPORINE (RESTASIS) 0.05 % ophthalmic emulsion Restasis 0.05 % eye drops in a dropperette    . lisinopril-hydrochlorothiazide (ZESTORETIC) 20-12.5 MG tablet Take 1 tablet by mouth daily. 90 tablet 1  . Multiple Vitamin (MULTIVITAMIN) tablet Take 1 tablet by mouth daily.    Marland Kitchen nystatin (MYCOSTATIN/NYSTOP) powder Apply 1 application topically 3 (three) times daily. 60 g 5  . omeprazole (PRILOSEC) 20 MG capsule TAKE 1 CAPSULE BY MOUTH EVERY DAY 90 capsule 1  . OVER THE COUNTER MEDICATION Cystic magnesium    . pregabalin (LYRICA) 150 MG capsule Take 1 capsule (150 mg total) by mouth 2 (two) times daily. 60 capsule 5  . Sennosides 8.6 MG CAPS Take by mouth.    . SYMBICORT 160-4.5 MCG/ACT inhaler INHALE 2 PUFFS INTO THE LUNGS IN THE MORNING AND AT BEDTIME. RINSE MOUTH AFTER EACH USE 30.6 Inhaler 7  . traMADol (ULTRAM) 50 MG tablet Take 1 tablet (50 mg total) by mouth 3 (three) times daily. 90 tablet 5  . Ubrogepant (UBRELVY) 50 MG TABS Take 50 mg by mouth as needed (take 1 at onset of headache, may repeat once 2 hours after initial dosing). (Patient not taking: No sig reported) 12 tablet 11  . Vitamin D, Ergocalciferol, (DRISDOL) 1.25 MG (50000 UNIT) CAPS capsule TAKE 1 CAPSULE (50,000 UNITS TOTAL) BY MOUTH EVERY  7 (SEVEN) DAYS 5 capsule 0   No facility-administered medications prior to visit.    PAST MEDICAL HISTORY: Past Medical History:  Diagnosis Date  . Anemia   . Arthritis    "plenty" (08-06-12)  . Asthma   . Chronic lower back pain    "they say I need a whole new spinal column" (Aug 06, 2012)  . COPD (chronic obstructive pulmonary disease) ([place])   . Degenerative arthritis    "all over" (August 06, 2012)  . Fx ankle   . GERD (gastroesophageal reflux disease)   . Gout 05/05/2013  . Hypertension   . Migraines    "used to; totally stopped when I quit drinking 30 yr ago" (08-06-12)  . Myalgia and myositis   . Myocardial infarction [facility]) 1992   2012/08/06 "mild MI when son died "  . Obesity   . Osteoporosis    "all over" (08/06/12)  . Shortness of breath    when stressed.  . Sleep apnea    "never did fix my machine; haven't used one for 5 years;  I've lost 116# since then; no problems now" (08/03/2012)  . Type II diabetes mellitus ([place])    "borderline; don't test; take Metformin" (08/03/2012)    PAST SURGICAL HISTORY: Past Surgical History:  Procedure Laterality Date  . ABDOMINAL HYSTERECTOMY  1990's  . GANGLION CYST EXCISION Right 1980's   "wrist" (08/03/2012)  . JOINT REPLACEMENT    . KNEE LIGAMENT RECONSTRUCTION Right 1980's  . TONSILLECTOMY  ~ 1954  . TOTAL HIP ARTHROPLASTY Left 08/03/2012  . TOTAL HIP ARTHROPLASTY Left 08/03/2012   Procedure: TOTAL HIP ARTHROPLASTY;  Surgeon: Garald Balding, MD;  Location: [place];  Service: Orthopedics;  Laterality: Left;  Left Total Hip Arthroplasty  . TOTAL HIP ARTHROPLASTY Left 08/28/2012   Procedure: Irrigation and Debridement hip ;  Surgeon: Mcarthur Rossetti, MD;  Location: [place];  Service: Orthopedics;  Laterality: Left;  . TOTAL KNEE ARTHROPLASTY Left 2001  . TOTAL KNEE ARTHROPLASTY Right 2004    FAMILY HISTORY: Family History  Problem Relation Age of Onset  . Arthritis Mother   . Arthritis Father   . Colon cancer Father   . Diabetes  Sister   . Arthritis Sister   . Diabetes Maternal Grandmother   . Arthritis Maternal Grandmother   . Diabetes Maternal Grandfather   . Arthritis Maternal Grandfather     SOCIAL HISTORY: Social History   Socioeconomic History  . Marital status: Widowed    Spouse name: Not on file  . Number of children: 5  . Years of education: PhD  . Highest education level: Not on file  Occupational History  . Occupation: Retired  Tobacco Use  . Smoking status: Former Smoker    Packs/day: 0.12    Years: 2.00    Pack years: 0.24    Types: Cigarettes  . Smokeless tobacco: Never Used  . Tobacco comment: 08/03/2012 "quit 30 years ago"  Vaping Use  . Vaping Use: Never used  Substance and Sexual Activity  . Alcohol use: No    Alcohol/week: 0.0 standard drinks    Comment: 08/03/2012 "used to be a beeralcoholic"; stopped ~ 30 yr ago"  . Drug use: No  . Sexual activity: Never  Other Topics Concern  . Not on file  Social History Narrative   Lives at home with her mother and caregiver.   Occasional use of caffeine.   Right-handed.   Social Determinants of Health   Financial Resource Strain: Not on file  Food Insecurity: Not on file  Transportation Needs: Not on file  Physical Activity: Not on file  Stress: Not on file  Social Connections: Not on file  Intimate Partner Violence: Not on file   PHYSICAL EXAM  Vitals:   07/23/20 0818  BP: 140/68  Pulse: 79  Weight: 295 lb (133.8 kg)  Height: '4\' 11"'  (1.499 m)   Body mass index is 59.58 kg/m.  Generalized: Well developed, in no acute distress, is obese Neurological examination  Mentation: Alert oriented to time, place, history taking. Follows all commands speech and language fluent Cranial nerve II-XII: Pupils were equal round reactive to light. Extraocular movements were full, visual field were full on confrontational test. Facial sensation and strength were normal. Head turning and shoulder shrug  were normal and symmetric. Motor:  Strength is intact in all extremities Sensory: Sensory testing is intact to soft touch on all 4 extremities. No evidence of extinction is noted.  Coordination: Cerebellar testing reveals good finger-nose-finger and heel-to-shin bilaterally.  Gait and station: Gait is wide-based, using walker  DIAGNOSTIC DATA (LABS, IMAGING, TESTING) - I reviewed patient records, labs, notes, testing and imaging myself where available.  Lab Results  Component Value Date   WBC 6.9 03/26/2020   HGB 10.8 (L) 03/26/2020   HCT 34.4 (L) 03/26/2020   MCV 88.0 03/26/2020   PLT 219.0 03/26/2020      Component Value Date/Time   NA 139 03/26/2020 1415   NA 142 07/14/2014 1014   K 4.1 03/26/2020 1415   CL 104 03/26/2020 1415   CO2 27 03/26/2020 1415   GLUCOSE 85 03/26/2020 1415   BUN 18 03/26/2020 1415   BUN 12 07/14/2014 1014   CREATININE 1.17 03/26/2020 1415   CREATININE 1.39 (H) 04/09/2018 1549   CALCIUM 8.9 03/26/2020 1415   PROT 6.9 03/07/2019 2115   PROT 6.9 07/14/2014 1014   ALBUMIN 3.2 (L) 03/07/2019 2115   ALBUMIN 4.3 07/14/2014 1014   AST 28 03/07/2019 2115   ALT 17 03/07/2019 2115   ALKPHOS 76 03/07/2019 2115   BILITOT 0.3 03/07/2019 2115   BILITOT 0.2 07/14/2014 1014   GFRNONAA 40 (L) 03/07/2019 2115   GFRAA 46 (L) 03/07/2019 2115   Lab Results  Component Value Date   CHOL 129 02/28/2019   HDL 59.80 02/28/2019   LDLCALC 58 02/28/2019   TRIG 57.0 02/28/2019   CHOLHDL 2 02/28/2019   Lab Results  Component Value Date   HGBA1C 6.2 03/26/2020   Lab Results  Component Value Date   VITAMINB12 307 07/14/2014   Lab Results  Component Value Date   TSH 2.80 02/26/2017   ASSESSMENT AND PLAN 75 y.o. year old female  has a past medical history of Anemia, Arthritis, Asthma, Chronic lower back pain, COPD (chronic obstructive pulmonary disease) ([place]), Degenerative arthritis, Fx ankle, GERD (gastroesophageal reflux disease), Gout (05/05/2013), Hypertension, Migraines, Myalgia and myositis,  Myocardial infarction ([place]) (1992), Obesity, Osteoporosis, Shortness of breath, Sleep apnea, and Type II diabetes mellitus ([place]). here with:  1.  Migraine headache -Check ESR, CRP rule out temporal arteritis, due to location of headaches to left temple area, has been chronic however, in 2016 were normal -Has not been able to tolerate Topamax, potential interaction with nortriptyline or Effexor and daily tramadol; takes tramadol and Lyrica for chronic pain issues; Would not offer beta-blocker as preventative due to problematic asthma  -Will again try Ubrelvy 50 mg as needed for acute headache, headaches are reported as once a week, brought on by stress, Tylenol usually works well for her -Going to weight loss clinic in June -See Krista Blue for migraines, [place] for OSA; follow-up in 6 months or sooner if needed, call if headaches worsen  2.  OSA on CPAP -Will address at next visit, encouraged to continue to use nightly greater than 4 hours  Butler Denmark, AGNP-C, DNP 07/23/2020, 8:47 AM [facility] [address], [place], [place] [address] [phone]

## 2020-07-24 ENCOUNTER — Other Ambulatory Visit: Payer: Self-pay | Admitting: Neurology

## 2020-07-24 ENCOUNTER — Telehealth: Payer: Self-pay | Admitting: Neurology

## 2020-07-24 LAB — C-REACTIVE PROTEIN: CRP: 16 mg/L — ABNORMAL HIGH (ref 0–10)

## 2020-07-24 LAB — SEDIMENTATION RATE: Sed Rate: 52 mm/hr — ABNORMAL HIGH (ref 0–40)

## 2020-07-24 MED ORDER — NURTEC 75 MG PO TBDP: 75.0000 mg | ORAL_TABLET | ORAL | 11 refills | Status: DC | PRN

## 2020-07-24 NOTE — Telephone Encounter (Signed)
Labs returned CRP mildly elevated at 16, ESR 52; ESR stable from 11 months prior. Checked yesterday due to report of left temporal headaches, however have been consistent pattern, labs negative before. Given elevation, could be other etiology, contributing such as obesity, achy body pains. Suggest recheck in 6 weeks for comparison, her headaches are brought on by stress, just double check no other indicators of temporal arteritis such as: fever, visual changes, jaw pain (is having some issues with dentures). Have her get the Roselyn Meier take for headache, we'll check in 6 weeks for comparison, I sent a reminder.

## 2020-07-24 NOTE — Telephone Encounter (Signed)
Called and was not able to [facility] for her (full).

## 2020-07-24 NOTE — Progress Notes (Signed)
Sandra Brennan is not on formulary, will try Nurtec. Is not a candidate for triptan due to age, vascular risk factors.

## 2020-07-25 ENCOUNTER — Other Ambulatory Visit: Payer: Self-pay

## 2020-07-25 NOTE — Telephone Encounter (Signed)
Received approval for Nurtec 75mg  odt ID [id number] [facility] RX [phone]. Fax to pharm [phone].  [place] [id number]. Pt informed.

## 2020-07-25 NOTE — Telephone Encounter (Signed)
Initiated [place] for Nurtec 75mg  odt #10.  triptan contraindicated due to cardiac hx. (MI). Uploaded to [facility] last ofv note. [facility]um RX.

## 2020-07-25 NOTE — Telephone Encounter (Signed)
Called could not LM VM full on mobile. Called her home #.  I was able to reach patient at home.  I relayed that one of her lab test CRP  for inflammation was mildly elevated SS/NP  wants to repeat it in 6 weeks and we will give her a call to remind her about that.  She stated that she has had no fever recently.  Her eyes (blurriness) switching glasses and  is going to be in touch with her ophthalmologist about having a test in regards to checking may be for diabetic issues.  Will have to do a PA an Nurtec as her insurance Roselyn Meier was not a preferred drug and so she verbalized understanding on this and I would let her know when that was done and what was result of this.

## 2020-07-26 ENCOUNTER — Encounter: Payer: Self-pay | Admitting: Nurse Practitioner

## 2020-07-26 ENCOUNTER — Ambulatory Visit (INDEPENDENT_AMBULATORY_CARE_PROVIDER_SITE_OTHER): Payer: Medicare Other | Admitting: Nurse Practitioner

## 2020-07-26 VITALS — BP 120/60 | HR 72 | Temp 97.1°F | Ht 59.0 in | Wt 294.8 lb

## 2020-07-26 DIAGNOSIS — I10 Essential (primary) hypertension: Secondary | ICD-10-CM | POA: Diagnosis not present

## 2020-07-26 NOTE — Telephone Encounter (Signed)
this PA for nutec approved 07-25-20. See other phone message.

## 2020-07-26 NOTE — Telephone Encounter (Signed)
I called pharmacy and nurtec is ready for pick up.

## 2020-07-26 NOTE — Assessment & Plan Note (Signed)
BP at goal with lisinopril/HCTZ BP Readings from Last 3 Encounters:  07/26/20 120/60  07/23/20 140/68  06/26/20 (!) 154/90   Maintain medications dose F/up in 72month

## 2020-07-26 NOTE — Progress Notes (Signed)
Subjective:  Patient ID: Sandra Brennan, female    DOB: 1945-11-03  Age: 75 y.o. MRN: [id number]  CC: Follow-up (1 month f/u on HTN. Pt states she has been taking BP medication as prescribed but does not check BP at home. BP in office today 120/60)  HPI  Hypertension BP at goal with lisinopril/HCTZ BP Readings from Last 3 Encounters:  07/26/20 120/60  07/23/20 140/68  06/26/20 (!) 154/90   Maintain medications dose F/up in 87month  BP Readings from Last 3 Encounters:  07/26/20 120/60  07/23/20 140/68  06/26/20 (!) 154/90    Reviewed past Medical, Social and Family history today.  Outpatient Medications Prior to Visit  Medication Sig Dispense Refill  . albuterol (VENTOLIN HFA) 108 (90 Base) MCG/ACT inhaler INHALE 1-2 PUFFS INTO THE LUNGS EVERY 6 (SIX) HOURS AS NEEDED FOR WHEEZING OR SHORTNESS OF BREATH 6.7 each 0  . allopurinol (ZYLOPRIM) 100 MG tablet TAKE 1 TABLET BY MOUTH EVERY DAY 90 tablet 3  . aspirin EC 81 MG tablet Take 81 mg by mouth daily.    Marland Kitchen atorvastatin (LIPITOR) 10 MG tablet TAKE 1 TABLET (10 MG TOTAL) BY MOUTH DAILY AT 6 PM. 90 tablet 3  . Carboxymethylcellulose Sodium (EYE DROPS OP) Apply to eye. Lubricant eye drops    . cetirizine (ZYRTEC) 10 MG tablet Take 10 mg by mouth daily.    . cycloSPORINE (RESTASIS) 0.05 % ophthalmic emulsion Restasis 0.05 % eye drops in a dropperette    . lisinopril-hydrochlorothiazide (ZESTORETIC) 20-12.5 MG tablet Take 1 tablet by mouth daily. 90 tablet 1  . Multiple Vitamin (MULTIVITAMIN) tablet Take 1 tablet by mouth daily.    Marland Kitchen nystatin (MYCOSTATIN/NYSTOP) powder Apply 1 application topically 3 (three) times daily. 60 g 5  . omeprazole (PRILOSEC) 20 MG capsule TAKE 1 CAPSULE BY MOUTH EVERY DAY 90 capsule 1  . OVER THE COUNTER MEDICATION Cystic magnesium    . pregabalin (LYRICA) 150 MG capsule Take 1 capsule (150 mg total) by mouth 2 (two) times daily. 60 capsule 5  . Rimegepant Sulfate (NURTEC) 75 MG TBDP Take 75 mg by mouth as  needed (take 1 at onset of headache, max is 75 in 24 hours). 10 tablet 11  . Sennosides 8.6 MG CAPS Take by mouth.    . SYMBICORT 160-4.5 MCG/ACT inhaler INHALE 2 PUFFS INTO THE LUNGS IN THE MORNING AND AT BEDTIME. RINSE MOUTH AFTER EACH USE 30.6 Inhaler 7  . traMADol (ULTRAM) 50 MG tablet Take 1 tablet (50 mg total) by mouth 3 (three) times daily. 90 tablet 5   No facility-administered medications prior to visit.    ROS See HPI  Objective:  BP 120/60 (BP Location: Left Arm, Patient Position: Sitting, Cuff Size: Large)   Pulse 72   Temp (!) 97.1 F (36.2 C) (Temporal)   Ht 4\' 11"  (1.499 m)   Wt 294 lb 12.8 oz (133.7 kg)   SpO2 99%   BMI 59.54 kg/m   Physical Exam  Assessment & Plan:  This visit occurred during the SARS-CoV-2 public health emergency.  Safety protocols were in place, including screening questions prior to the visit, additional usage of staff PPE, and extensive cleaning of exam room while observing appropriate contact time as indicated for disinfecting solutions.   Mozel was seen today for follow-up.  Diagnoses and all orders for this visit:  Primary hypertension   Problem List Items Addressed This Visit      Cardiovascular and Mediastinum   Hypertension - Primary (Chronic)  BP at goal with lisinopril/HCTZ BP Readings from Last 3 Encounters:  07/26/20 120/60  07/23/20 140/68  06/26/20 (!) 154/90   Maintain medications dose F/up in 26month         Follow-up: Return in about 4 weeks (around 08/23/2020) for DM and HTN, hyperlipidemia(11mins, fasting).  Wilfred Lacy, NP

## 2020-07-26 NOTE — Patient Instructions (Addendum)
Maintain current medications F/up in 33month Maintain DASh diet, use CPAP as directed Daily exercise regimen: walking (10-73mins increments 2-3x/day), chair exercises (with arm and leg exercise) Use rice sock to provide warm compress for joints prior to exercise.

## 2020-08-02 ENCOUNTER — Encounter: Payer: Self-pay | Admitting: Registered Nurse

## 2020-08-02 ENCOUNTER — Encounter: Payer: Medicare Other | Attending: Registered Nurse | Admitting: Registered Nurse

## 2020-08-02 ENCOUNTER — Other Ambulatory Visit: Payer: Self-pay

## 2020-08-02 VITALS — BP 124/77 | HR 80 | Temp 98.4°F | Ht 59.0 in | Wt 296.6 lb

## 2020-08-02 DIAGNOSIS — Z79899 Other long term (current) drug therapy: Secondary | ICD-10-CM | POA: Insufficient documentation

## 2020-08-02 DIAGNOSIS — M25511 Pain in right shoulder: Secondary | ICD-10-CM | POA: Diagnosis not present

## 2020-08-02 DIAGNOSIS — M7062 Trochanteric bursitis, left hip: Secondary | ICD-10-CM | POA: Diagnosis not present

## 2020-08-02 DIAGNOSIS — G5793 Unspecified mononeuropathy of bilateral lower limbs: Secondary | ICD-10-CM | POA: Insufficient documentation

## 2020-08-02 DIAGNOSIS — G894 Chronic pain syndrome: Secondary | ICD-10-CM | POA: Insufficient documentation

## 2020-08-02 DIAGNOSIS — M25512 Pain in left shoulder: Secondary | ICD-10-CM

## 2020-08-02 DIAGNOSIS — M48062 Spinal stenosis, lumbar region with neurogenic claudication: Secondary | ICD-10-CM

## 2020-08-02 DIAGNOSIS — M48061 Spinal stenosis, lumbar region without neurogenic claudication: Secondary | ICD-10-CM

## 2020-08-02 DIAGNOSIS — M7061 Trochanteric bursitis, right hip: Secondary | ICD-10-CM | POA: Insufficient documentation

## 2020-08-02 DIAGNOSIS — M797 Fibromyalgia: Secondary | ICD-10-CM | POA: Insufficient documentation

## 2020-08-02 DIAGNOSIS — G8929 Other chronic pain: Secondary | ICD-10-CM | POA: Insufficient documentation

## 2020-08-02 DIAGNOSIS — Z5181 Encounter for therapeutic drug level monitoring: Secondary | ICD-10-CM | POA: Diagnosis not present

## 2020-08-02 MED ORDER — PREGABALIN 150 MG PO CAPS: 150.0000 mg | ORAL_CAPSULE | Freq: Two times a day (BID) | ORAL | 5 refills | Status: DC

## 2020-08-02 MED ORDER — TRAMADOL HCL 50 MG PO TABS: 50.0000 mg | ORAL_TABLET | Freq: Three times a day (TID) | ORAL | 5 refills | Status: DC

## 2020-08-02 NOTE — Progress Notes (Signed)
Subjective:    Patient ID: Sandra Brennan, female    DOB: 20-Jun-1945, 75 y.o.   MRN: [id number]  HPI: Sandra Brennan is a 75 y.o. female who returns for follow up appointment for chronic pain and medication refill. She states her pain is located in her  Bilateral shoulders, lower back pain, bilateral hips and Bilateral hands and bilateral feet pain with tingling and burning. She rates her  Pain 9. Her current exercise regime is walking and performing stretching exercises.  Ms. Baade Morphine equivalent is 15.00 MME.  Oral Swab performed today.     Pain Inventory Average Pain 9 Pain Right Now 9 My pain is constant, sharp, stabbing and aching  In the last 24 hours, has pain interfered with the following? General activity 8 Relation with others 9 Enjoyment of life 10 What TIME of day is your pain at its worst? night Sleep (in general) Poor  Pain is worse with: walking, sitting and standing Pain improves with: rest and medication Relief from Meds: 6  Family History  Problem Relation Age of Onset  . Arthritis Mother   . Arthritis Father   . Colon cancer Father   . Diabetes Sister   . Arthritis Sister   . Diabetes Maternal Grandmother   . Arthritis Maternal Grandmother   . Diabetes Maternal Grandfather   . Arthritis Maternal Grandfather    Social History   Socioeconomic History  . Marital status: Widowed    Spouse name: Not on file  . Number of children: 5  . Years of education: PhD  . Highest education level: Not on file  Occupational History  . Occupation: Retired  Tobacco Use  . Smoking status: Former Smoker    Packs/day: 0.12    Years: 2.00    Pack years: 0.24    Types: Cigarettes  . Smokeless tobacco: Never Used  . Tobacco comment: 08/03/2012 "quit 30 years ago"  Vaping Use  . Vaping Use: Never used  Substance and Sexual Activity  . Alcohol use: No    Alcohol/week: 0.0 standard drinks    Comment: 08/03/2012 "used to be a beeralcoholic"; stopped ~ 30 yr ago"   . Drug use: No  . Sexual activity: Never  Other Topics Concern  . Not on file  Social History Narrative   Lives at home with her mother and caregiver.   Occasional use of caffeine.   Right-handed.   Social Determinants of Health   Financial Resource Strain: Not on file  Food Insecurity: Not on file  Transportation Needs: Not on file  Physical Activity: Not on file  Stress: Not on file  Social Connections: Not on file   Past Surgical History:  Procedure Laterality Date  . ABDOMINAL HYSTERECTOMY  1990's  . GANGLION CYST EXCISION Right 1980's   "wrist" (08/03/2012)  . JOINT REPLACEMENT    . KNEE LIGAMENT RECONSTRUCTION Right 1980's  . TONSILLECTOMY  ~ 1954  . TOTAL HIP ARTHROPLASTY Left 08/03/2012  . TOTAL HIP ARTHROPLASTY Left 08/03/2012   Procedure: TOTAL HIP ARTHROPLASTY;  Surgeon: Garald Balding, MD;  Location: [facility];  Service: Orthopedics;  Laterality: Left;  Left Total Hip Arthroplasty  . TOTAL HIP ARTHROPLASTY Left 08/28/2012   Procedure: Irrigation and Debridement hip ;  Surgeon: Mcarthur Rossetti, MD;  Location: [facility];  Service: Orthopedics;  Laterality: Left;  . TOTAL KNEE ARTHROPLASTY Left 2001  . TOTAL KNEE ARTHROPLASTY Right 2004   Past Surgical History:  Procedure Laterality Date  . ABDOMINAL  HYSTERECTOMY  1990's  . GANGLION CYST EXCISION Right 1980's   "wrist" (08-22-12)  . JOINT REPLACEMENT    . KNEE LIGAMENT RECONSTRUCTION Right 1980's  . TONSILLECTOMY  ~ 1954  . TOTAL HIP ARTHROPLASTY Left 2012/08/22  . TOTAL HIP ARTHROPLASTY Left 08/22/2012   Procedure: TOTAL HIP ARTHROPLASTY;  Surgeon: Garald Balding, MD;  Location: [facility];  Service: Orthopedics;  Laterality: Left;  Left Total Hip Arthroplasty  . TOTAL HIP ARTHROPLASTY Left 08/28/2012   Procedure: Irrigation and Debridement hip ;  Surgeon: Mcarthur Rossetti, MD;  Location: [facility];  Service: Orthopedics;  Laterality: Left;  . TOTAL KNEE ARTHROPLASTY Left 2001  . TOTAL KNEE ARTHROPLASTY Right 2004    Past Medical History:  Diagnosis Date  . Anemia   . Arthritis    "plenty" (2012/08/22)  . Asthma   . Chronic lower back pain    "they say I need a whole new spinal column" (Aug 22, 2012)  . COPD (chronic obstructive pulmonary disease) ([place])   . Degenerative arthritis    "all over" (2012-08-22)  . Fx ankle   . GERD (gastroesophageal reflux disease)   . Gout 05/05/2013  . Hypertension   . Migraines    "used to; totally stopped when I quit drinking 30 yr ago" (Aug 22, 2012)  . Myalgia and myositis   . Myocardial infarction [facility]) 1992   22-Aug-2012 "mild MI when son died "  . Obesity   . Osteoporosis    "all over" (August 22, 2012)  . Shortness of breath    when stressed.  . Sleep apnea    "never did fix my machine; haven't used one for 5 years; I've lost 116# since then; no problems now" (08/22/12)  . Type II diabetes mellitus ([place])    "borderline; don't test; take Metformin" (08-22-12)   BP 124/77   Pulse 80   Temp 98.4 F (36.9 C)   Ht 4\' 11"  (1.499 m)   Wt 296 lb 9.6 oz (134.5 kg)   SpO2 94%   BMI 59.91 kg/m   Opioid Risk Score:   Fall Risk Score:  `1  Depression screen PHQ 2/9  Depression screen [facility] 2/9 08/02/2020 08/02/2020 08/23/2019 05/04/2019 07/12/2018 04/09/2018 04/09/2018  Decreased Interest 1 0 0 0 0 2 0  Down, Depressed, Hopeless 1 0 0 0 1 1 0  PHQ - 2 Score 2 0 0 0 1 3 0  Altered sleeping - - - - 3 3 -  Tired, decreased energy - - - - 0 2 -  Change in appetite - - - - 3 0 -  Feeling bad or failure about yourself  - - - - 1 0 -  Trouble concentrating - - - - 0 2 -  Moving slowly or fidgety/restless - - - - 0 0 -  Suicidal thoughts - - - - 0 0 -  PHQ-9 Score - - - - 8 10 -  Difficult doing work/chores - - - - - - -  Some recent data might be hidden    Review of Systems  Constitutional: Negative.   HENT: Negative.   Eyes: Negative.   Respiratory: Negative.   Cardiovascular: Negative.   Gastrointestinal: Negative.   Endocrine: Negative.   Genitourinary: Negative.    Musculoskeletal: Positive for arthralgias, back pain, gait problem and myalgias.  Skin: Negative.   Allergic/Immunologic: Negative.   Hematological: Negative.   Psychiatric/Behavioral: Positive for dysphoric mood.  All other systems reviewed and are negative.      Objective:  Physical Exam Vitals and nursing note reviewed.  Constitutional:      Appearance: Normal appearance.  Cardiovascular:     Rate and Rhythm: Normal rate and regular rhythm.     Pulses: Normal pulses.     Heart sounds: Normal heart sounds.  Pulmonary:     Effort: Pulmonary effort is normal.     Breath sounds: Wheezing present.  Musculoskeletal:     Cervical back: Normal range of motion and neck supple.     Comments: Normal Muscle Bulk and Muscle Testing Reveals:  Upper Extremities: Decreased ROM 45 degrees and Muscle Strength 4/5 Bilateral AC Joint Tenderness Thoracic and Lumbar Hypersensitivity Bilateral Greater Trochanter Tenderness  Lower Extremities: Decreased ROM and Muscle Strength 5/5 Bilateral Lower Extremities Flexion Produces Pain into her Bilateral Lower Extremities Arises from Table slowly using walker for support Narrow Based Gait   Skin:    General: Skin is warm and dry.  Neurological:     Mental Status: She is alert and oriented to person, place, and time.  Psychiatric:        Mood and Affect: Mood normal.        Behavior: Behavior normal.           Assessment & Plan:  1. Chronic  Bilateral Shoulder Pain: Continue HEP as Tolerated.  Continue to Monitor. 08/02/2020 2. Lumbar Spinal Stenosis: Continue HEP as Tolerated . Continue to Monitor.08/02/2020 3. Fibromyalgia: Continue HEP as Tolerated. Continue to Monitor. 08/02/2020 4. Bilateral Greater Trochanter Bursitis: Continue to alternate Ice and Heat Therapy. Continue to Monitor. 08/02/2020. 5. Bilateral Lower Extremities with Neuropathic Pain: Continue Pregablin. Continue to Monitor. 08/02/2020 6.. Chronic Pain Syndrome:  Refilled:Tramadol50 mg one tablet TID as needed for pain #90.We will continue the opioid monitoring program, this consists of regular clinic visits, examinations, urine drug screen, pill counts as well as use of [place] Controlled Substance Reporting system. A 12 month History has been reviewed on the [place] Controlled Substance Reporting System on 08/02/2020.  Continue to monitor.  F/U in 6 months with Dr. Letta Pate

## 2020-08-06 LAB — DRUG TOX MONITOR 1 W/CONF, ORAL FLD

## 2020-08-06 LAB — DRUG TOX ALC METAB W/CON, ORAL FLD: Alcohol Metabolite: NEGATIVE ng/mL (ref <25)

## 2020-08-07 ENCOUNTER — Encounter: Payer: Self-pay | Admitting: Dietician

## 2020-08-07 ENCOUNTER — Encounter: Payer: Medicare Other | Attending: Nurse Practitioner | Admitting: Dietician

## 2020-08-07 ENCOUNTER — Other Ambulatory Visit: Payer: Self-pay

## 2020-08-07 VITALS — Ht 59.0 in | Wt 299.4 lb

## 2020-08-07 DIAGNOSIS — Z6841 Body Mass Index (BMI) 40.0 and over, adult: Secondary | ICD-10-CM | POA: Diagnosis not present

## 2020-08-07 DIAGNOSIS — E669 Obesity, unspecified: Secondary | ICD-10-CM

## 2020-08-07 DIAGNOSIS — I10 Essential (primary) hypertension: Secondary | ICD-10-CM | POA: Diagnosis not present

## 2020-08-07 DIAGNOSIS — E1142 Type 2 diabetes mellitus with diabetic polyneuropathy: Secondary | ICD-10-CM | POA: Insufficient documentation

## 2020-08-07 DIAGNOSIS — F339 Major depressive disorder, recurrent, unspecified: Secondary | ICD-10-CM | POA: Diagnosis not present

## 2020-08-07 DIAGNOSIS — E782 Mixed hyperlipidemia: Secondary | ICD-10-CM | POA: Insufficient documentation

## 2020-08-07 NOTE — Progress Notes (Signed)
Medical Nutrition Therapy  Appointment Start time:  76  Appointment End time:  1135  Primary concerns today: Weight Loss  Referral diagnosis: I10 - Essential Hypertension, F33.9 -  Depression, recurrent, E11.42 Type 2 diabetes mellitus with diabetic polyneuropathy, without long-term current use of insulin, E78.2 - Mixed hyperlipidemia, E66.01, Z68.43 - Class 3 severe obesity due to excess calories with serious comorbidity and body mass index (BMI) of 50.0 to 59.9 in adult Preferred learning style: No preference indicated Learning readiness: Contemplating   NUTRITION ASSESSMENT   Anthropometrics  Ht: 4'11" Wt: 299.4 lbs Body mass index is 60.47 kg/m.  Clinical Medical Hx: HTN, HLD, T2DM, Depression, GERD, Fibromyalgia, Anemia Medications: Prilosec Labs: A1c - 6.4, GFR - 46.52 (low), Hemoglobin - 10.8 (low), HCT - 34.4% (low) Notable Signs/Symptoms: Lower leg edema, SOB  Lifestyle & Dietary Hx Pt reports having vibration/tingling/tightness in their right leg that has caused falls in the past. Doctor recommended they go on a walker to rest until these symptoms subside to prevent future falls.  Pt is using walker today, also has difficulty walking for short periods of time without shortness of breath. Pt reports history of GERD that is well controlled with Prilosec. Pt reports losing over 130 pounds about two years ago, but has put that weight back on in the last 9 months. Pt reports weight loss was motivated by health problems resulting from body weight.  Pt currently cares for their elderly mother, with the help of an aide for 2 hours a day. Pt also watches 17 year old grandson.  Pt will only cook for others (mother), not for themself.  Doesn't like to waste food. Pt had 17 teeth pulled and got dentures in January, has only been able to eat Jello w fruit, PB crackers since. Having difficulty keeping lower dentures in place, will be making an appointment to get the fitment checked soon.  Pt reports occasional trouble swallowing due to denture pallets, states sometimes feels like food gets caught in their chest. Pt will drink a little water and the food will move down. Pt states they eat mostly soft foods. Pt states they do not eat like they should. May eat a jello for breakfast, nothing during the day, and a sandwich at night. Pt states the rest of their diet consists of junkfood (chips, PB crackers, oatmeal cookies), mainly at night.  Pt states they are not a vegetable eater, more of a meat eater. Likes cabbage, broccoli, string beans, corn, greens, chicken (baked or fried), pintos, black eyed peas, lima beans.  Pt is allergic to pineapple, causes shortness of breath.    Estimated daily fluid intake: 24 oz Supplements: Daily MV, Vitamin D Sleep: Pt is on CPAP Stress / self-care: Caring for mother/grandson is stressful Current average weekly physical activity: ADLs  24-Hr Dietary Recall First Meal: Glass of OJ, pack of peanut butter cracker Snack: 1 bottle of Cheerwine, 1 bottle of water Second Meal: none Snack: none Third Meal: McDonald's McChicken sandwich Snack: Jello with fruit Beverages: Cheerwine, water, OJ   NUTRITION DIAGNOSIS  NB-1.1 Food and nutrition-related knowledge deficit As related to obesity.  As evidenced by BMI 60.47 kg/m2, very limited food choices, and skipping 1-2 meals a day.Marland Kitchen   NUTRITION INTERVENTION  Nutrition education (E-1) on the following topics:  Educated patient on the importance of eating consistently throughout the day. Emphasize the importance of consistent protein intake each day, whether through food or supplements. Counseled patient on beginning to rebuild their trust in themselves  to make the right food choices for their health. This includes putting themselves before anyone else. Advised patient to evaluate whether the impulse to eat is hunger based, or emotionally driven. Recommended that patient choose easily prepared meals that  can be eaten multiple times to minimize food waste.   Handouts Provided Include   N/A  Learning Style & Readiness for Change Teaching method utilized: Visual & Auditory  Demonstrated degree of understanding via: Teach Back  Barriers to learning/adherence to lifestyle change: Pain, mobility  Goals Established by Pt  Try to join your mother and eat one meal that you prepared for her each week.  Set an alarm on your phone to remind you to eat lunch each afternoon.  Have an Ensure/Boost/Equate around the middle of the day if you are unable to eat lunch.  Look for "Steam In Bag" vegetables for a cheap and fast option to eat the vegetables you like.   Keep a bottle of water with you at all times and sip on it throughout the day.   Keep a bottle in the basket of your walker  Try some different flavors of Sugar-Free Jello  Have a cup of Fat-Free greek yogurt and/or oatmeal with breakfast.   Try Danon "Light n' Fit" or Oikos "Triple Zero" yogurts  Add some grapes or strawberries to your yogurt.   MONITORING & EVALUATION Dietary intake, weekly physical activity, and meal pattern in 6 weeks.  Next Steps  Patient is to follow up with RD.

## 2020-08-07 NOTE — Patient Instructions (Addendum)
Try to join your mother and eat one meal that you prepared for her each week.  Set an alarm on your phone to remind you to eat lunch each afternoon.  Have an Ensure/Boost/Equate around the middle of the day if you are unable to eat lunch.  Look for "Steam In Bag" vegetables for a cheap and fast option to eat the vegetables you like.   Keep a bottle of water with you at all times and sip on it throughout the day. Keep a bottle in the basket of your walker  Try some different flavors of Sugar-Free Jello  Have a cup of Fat-Free greek yogurt and/or oatmeal with breakfast.  Try Danon "Light n' Fit" or Oikos "Triple Zero" yogurts

## 2020-08-11 ENCOUNTER — Other Ambulatory Visit: Payer: Self-pay | Admitting: Physical Medicine & Rehabilitation

## 2020-08-11 ENCOUNTER — Other Ambulatory Visit: Payer: Self-pay | Admitting: Nurse Practitioner

## 2020-08-11 DIAGNOSIS — J4520 Mild intermittent asthma, uncomplicated: Secondary | ICD-10-CM

## 2020-08-11 DIAGNOSIS — E559 Vitamin D deficiency, unspecified: Secondary | ICD-10-CM

## 2020-08-11 DIAGNOSIS — I10 Essential (primary) hypertension: Secondary | ICD-10-CM

## 2020-08-11 NOTE — Therapy (Signed)
[place], [place], [address] Phone: [phone]   Fax:  [phone]  Physical Therapy Treatment/Discharge  Patient Details  Name: Sandra Brennan MRN: [id number] Date of Birth: 1945/04/08 Referring Provider (PT): Flossie Buffy, NP   Encounter Date: 06/07/2020    Past Medical History:  Diagnosis Date   Anemia    Arthritis    "plenty" (08/19/12)   Asthma    Chronic lower back pain    "they say I need a whole new spinal column" (2012/08/19)   COPD (chronic obstructive pulmonary disease) ([place])    Degenerative arthritis    "all over" (08/19/12)   Fx ankle    GERD (gastroesophageal reflux disease)    Gout 05/05/2013   Hypertension    Migraines    "used to; totally stopped when I quit drinking 30 yr ago" (08-19-2012)   Myalgia and myositis    Myocardial infarction [facility]) 1992   08/19/2012 "mild MI when son died "   Obesity    Osteoporosis    "all over" (19-Aug-2012)   Shortness of breath    when stressed.   Sleep apnea    "never did fix my machine; haven't used one for 5 years; I've lost 116# since then; no problems now" (08-19-12)   Type II diabetes mellitus ([place])    "borderline; don't test; take Metformin" (19-Aug-2012)    Past Surgical History:  Procedure Laterality Date   ABDOMINAL HYSTERECTOMY  1990's   GANGLION CYST EXCISION Right 1980's   "wrist" (2012-08-19)   JOINT REPLACEMENT     KNEE LIGAMENT RECONSTRUCTION Right 1980's   TONSILLECTOMY  ~ [place] Left Aug 19, 2012   TOTAL HIP ARTHROPLASTY Left 08-19-2012   Procedure: TOTAL HIP ARTHROPLASTY;  Surgeon: Garald Balding, MD;  Location: [place];  Service: Orthopedics;  Laterality: Left;  Left Total Hip Arthroplasty   TOTAL HIP ARTHROPLASTY Left 08/28/2012   Procedure: Irrigation and Debridement hip ;  Surgeon: Mcarthur Rossetti, MD;  Location: [place];  Service: Orthopedics;  Laterality: Left;   TOTAL KNEE ARTHROPLASTY Left 2001    TOTAL KNEE ARTHROPLASTY Right 2004    There were no vitals filed for this visit.                                PT Short Term Goals - 05/24/20 1116       PT SHORT TERM GOAL #1   Title Independent with initial/basic HEP    Baseline met    Time 2    Period Weeks    Status Achieved      PT SHORT TERM GOAL #2   Title Review pt. FOTO self report by visit 3    Baseline met    Time 2    Period Weeks    Status Achieved      PT SHORT TERM GOAL #3   Title Assess 5 times sit<>stand to establish baseline for improving ability for transfers    Baseline met    Time 2    Period Weeks    Status Achieved               PT Long Term Goals - 05/24/20 1117       PT LONG TERM GOAL #1   Title Improve FOTO outcome measure score to 43% functional status or greater    Baseline 45% on 05/15/20  Time 6    Period Weeks    Status Achieved      PT LONG TERM GOAL #2   Title Improve Tinetti Gait + Balance score at least 5 points to work towards decreased fall risk    Baseline met-revise goal for Tinetti score >19/28 for decreased fall risk    Time 6    Period Weeks    Status Revised    Target Date 07/05/20      PT LONG TERM GOAL #3   Title Improve bilat. LE strength at least grossly 1/2 MMT grade to improve ability for transfers from low chairs and bed mobility for sit<>supine    Baseline see objective    Time 6    Period Weeks    Status Partially Met    Target Date 07/05/20      PT LONG TERM GOAL #4   Title Improve 5 times sit<>stand time to 15 seconds or less as demonstration of improved functional LE strength and to improve transfer ability from low seats    Baseline 21 sec 05/24/20    Time 6    Period Weeks    Status New    Target Date 07/05/20                    Patient will benefit from skilled therapeutic intervention in order to improve the following deficits and impairments:  Pain, Impaired UE functional use, Decreased  strength, Decreased activity tolerance, Abnormal gait, Decreased balance, Impaired sensation, Obesity, Difficulty walking, Decreased endurance  Visit Diagnosis: Other abnormalities of gait and mobility  Muscle weakness (generalized)  Difficulty in walking, not elsewhere classified  Pain in left hand  Pain in right hand     Problem List Patient Active Problem List   Diagnosis Date Noted   Intertrigo 03/27/2020   Anemia 08/23/2019   Polyethylene liner wear following total hip arthroplasty requiring isolated polyethylene liner exchange (HCC) 07/12/2019   Depression, recurrent (HCC) 07/12/2018   Migraine without aura and without status migrainosus, not intractable 09/10/2017   Post-traumatic osteoarthritis of right ankle 06/09/2016   Spondylosis without myelopathy or radiculopathy, lumbar region 06/09/2016   Cervical spinal stenosis 06/09/2016   Spinal stenosis, lumbar region, without neurogenic claudication 06/09/2016   Chronic gouty arthritis 06/04/2016   Vitamin D deficiency 06/02/2016   Hyperlipidemia 06/02/2016   Closed right ankle fracture 05/01/2016   Colon polyps 05/01/2016   Fibromyalgia syndrome 05/01/2016   Syncope 03/30/2016   Chest pain 03/30/2016   Fall 03/30/2016   GERD with esophagitis 06/21/2015   Pharyngeal dysphagia 06/21/2015   Obesity 05/06/2013   Acute nontraumatic kidney injury (HCC) 05/05/2013   Acute posthemorrhagic anemia 09/06/2012   Gout 09/05/2012   Osteoarthritis of left hip 08/05/2012   Type 2 diabetes mellitus with diabetic polyneuropathy, without long-term current use of insulin (HCC) 08/05/2012   Hypertension 08/05/2012   Asthma, chronic 08/05/2012   Sleep apnea 08/05/2012       PHYSICAL THERAPY DISCHARGE SUMMARY  Visits from Start of Care: 13  Current functional level related to goals / functional outcomes: Patient did not return for further therapy after last session 06/07/20. No further PT visits scheduled or planned at this  time.   Remaining deficits: Continued RLE pain as of last session, current status unknown/no further visits after 06/07/20   Education / Equipment: Previous HEP instruction   Patient agrees to discharge. Patient goals were partially met. Patient is being discharged due to not returning since the last visit.         Christopher Zoch, PT, DPT 08/11/20 8:47 AM     [facility] [address] [place], [place], [address] Phone: [phone]   Fax:  [phone]  Name: Paytyn M Ow MRN: [id number] Date of Birth: 01/10/1946    

## 2020-08-12 ENCOUNTER — Other Ambulatory Visit: Payer: Self-pay | Admitting: Physical Medicine & Rehabilitation

## 2020-08-16 ENCOUNTER — Telehealth: Payer: Self-pay | Admitting: *Deleted

## 2020-08-16 NOTE — Telephone Encounter (Signed)
Oral swab drug screen was consistent for prescribed medications.  ?

## 2020-08-19 ENCOUNTER — Observation Stay (HOSPITAL_COMMUNITY)
Admission: EM | Admit: 2020-08-19 | Discharge: 2020-08-23 | Disposition: A | Discharge status: Home / Self Care | Payer: Medicare Other | Attending: Family Medicine | Admitting: Family Medicine

## 2020-08-19 ENCOUNTER — Other Ambulatory Visit: Payer: Self-pay

## 2020-08-19 ENCOUNTER — Encounter (HOSPITAL_COMMUNITY): Payer: Self-pay

## 2020-08-19 DIAGNOSIS — M25569 Pain in unspecified knee: Secondary | ICD-10-CM

## 2020-08-19 DIAGNOSIS — M25512 Pain in left shoulder: Secondary | ICD-10-CM | POA: Diagnosis not present

## 2020-08-19 DIAGNOSIS — Y92009 Unspecified place in unspecified non-institutional (private) residence as the place of occurrence of the external cause: Secondary | ICD-10-CM | POA: Insufficient documentation

## 2020-08-19 DIAGNOSIS — E119 Type 2 diabetes mellitus without complications: Secondary | ICD-10-CM | POA: Insufficient documentation

## 2020-08-19 DIAGNOSIS — J45909 Unspecified asthma, uncomplicated: Secondary | ICD-10-CM | POA: Diagnosis not present

## 2020-08-19 DIAGNOSIS — M48061 Spinal stenosis, lumbar region without neurogenic claudication: Secondary | ICD-10-CM | POA: Diagnosis present

## 2020-08-19 DIAGNOSIS — W19XXXA Unspecified fall, initial encounter: Secondary | ICD-10-CM

## 2020-08-19 DIAGNOSIS — J449 Chronic obstructive pulmonary disease, unspecified: Secondary | ICD-10-CM | POA: Diagnosis not present

## 2020-08-19 DIAGNOSIS — M25511 Pain in right shoulder: Secondary | ICD-10-CM | POA: Diagnosis not present

## 2020-08-19 DIAGNOSIS — N183 Chronic kidney disease, stage 3 unspecified: Secondary | ICD-10-CM

## 2020-08-19 DIAGNOSIS — R519 Headache, unspecified: Secondary | ICD-10-CM | POA: Diagnosis not present

## 2020-08-19 DIAGNOSIS — M1612 Unilateral primary osteoarthritis, left hip: Secondary | ICD-10-CM | POA: Diagnosis present

## 2020-08-19 DIAGNOSIS — M542 Cervicalgia: Secondary | ICD-10-CM | POA: Insufficient documentation

## 2020-08-19 DIAGNOSIS — M25572 Pain in left ankle and joints of left foot: Secondary | ICD-10-CM | POA: Insufficient documentation

## 2020-08-19 DIAGNOSIS — R600 Localized edema: Secondary | ICD-10-CM | POA: Diagnosis not present

## 2020-08-19 DIAGNOSIS — M545 Low back pain, unspecified: Secondary | ICD-10-CM | POA: Diagnosis not present

## 2020-08-19 DIAGNOSIS — M25571 Pain in right ankle and joints of right foot: Secondary | ICD-10-CM | POA: Diagnosis not present

## 2020-08-19 DIAGNOSIS — Z96642 Presence of left artificial hip joint: Secondary | ICD-10-CM | POA: Diagnosis not present

## 2020-08-19 DIAGNOSIS — Z743 Need for continuous supervision: Secondary | ICD-10-CM | POA: Diagnosis not present

## 2020-08-19 DIAGNOSIS — N179 Acute kidney failure, unspecified: Principal | ICD-10-CM | POA: Insufficient documentation

## 2020-08-19 DIAGNOSIS — I10 Essential (primary) hypertension: Secondary | ICD-10-CM | POA: Insufficient documentation

## 2020-08-19 DIAGNOSIS — Z20822 Contact with and (suspected) exposure to covid-19: Secondary | ICD-10-CM | POA: Diagnosis not present

## 2020-08-19 DIAGNOSIS — W010XXA Fall on same level from slipping, tripping and stumbling without subsequent striking against object, initial encounter: Secondary | ICD-10-CM | POA: Diagnosis not present

## 2020-08-19 DIAGNOSIS — M19012 Primary osteoarthritis, left shoulder: Secondary | ICD-10-CM | POA: Diagnosis not present

## 2020-08-19 DIAGNOSIS — I2510 Atherosclerotic heart disease of native coronary artery without angina pectoris: Secondary | ICD-10-CM | POA: Diagnosis not present

## 2020-08-19 DIAGNOSIS — S0990XA Unspecified injury of head, initial encounter: Secondary | ICD-10-CM | POA: Diagnosis not present

## 2020-08-19 DIAGNOSIS — Z043 Encounter for examination and observation following other accident: Secondary | ICD-10-CM | POA: Diagnosis not present

## 2020-08-19 DIAGNOSIS — S79911A Unspecified injury of right hip, initial encounter: Secondary | ICD-10-CM | POA: Diagnosis not present

## 2020-08-19 DIAGNOSIS — Z96653 Presence of artificial knee joint, bilateral: Secondary | ICD-10-CM | POA: Diagnosis not present

## 2020-08-19 DIAGNOSIS — M7989 Other specified soft tissue disorders: Secondary | ICD-10-CM | POA: Diagnosis not present

## 2020-08-19 DIAGNOSIS — M19011 Primary osteoarthritis, right shoulder: Secondary | ICD-10-CM | POA: Diagnosis not present

## 2020-08-19 DIAGNOSIS — M852 Hyperostosis of skull: Secondary | ICD-10-CM | POA: Diagnosis not present

## 2020-08-19 NOTE — ED Triage Notes (Signed)
[name] after falling an hour ago, landing on her right side complaining of hip pain, neck pain and upper back. Patient not on blood thinners, vitals stable. Patient said she was on the floor for an hour.

## 2020-08-20 ENCOUNTER — Emergency Department (HOSPITAL_COMMUNITY): Payer: Medicare Other

## 2020-08-20 ENCOUNTER — Encounter (HOSPITAL_COMMUNITY): Payer: Self-pay | Admitting: Family Medicine

## 2020-08-20 DIAGNOSIS — M19011 Primary osteoarthritis, right shoulder: Secondary | ICD-10-CM | POA: Diagnosis not present

## 2020-08-20 DIAGNOSIS — N179 Acute kidney failure, unspecified: Secondary | ICD-10-CM | POA: Diagnosis not present

## 2020-08-20 DIAGNOSIS — M545 Low back pain, unspecified: Secondary | ICD-10-CM | POA: Diagnosis not present

## 2020-08-20 DIAGNOSIS — M25569 Pain in unspecified knee: Secondary | ICD-10-CM

## 2020-08-20 DIAGNOSIS — R600 Localized edema: Secondary | ICD-10-CM | POA: Diagnosis not present

## 2020-08-20 DIAGNOSIS — M19012 Primary osteoarthritis, left shoulder: Secondary | ICD-10-CM | POA: Diagnosis not present

## 2020-08-20 DIAGNOSIS — M852 Hyperostosis of skull: Secondary | ICD-10-CM | POA: Diagnosis not present

## 2020-08-20 DIAGNOSIS — Z043 Encounter for examination and observation following other accident: Secondary | ICD-10-CM | POA: Diagnosis not present

## 2020-08-20 DIAGNOSIS — N183 Chronic kidney disease, stage 3 unspecified: Secondary | ICD-10-CM

## 2020-08-20 DIAGNOSIS — M7989 Other specified soft tissue disorders: Secondary | ICD-10-CM | POA: Diagnosis not present

## 2020-08-20 DIAGNOSIS — S0990XA Unspecified injury of head, initial encounter: Secondary | ICD-10-CM | POA: Diagnosis not present

## 2020-08-20 LAB — CBC
HCT: 36.5 % (ref 36.0–46.0)
HCT: 35.5 % — ABNORMAL LOW (ref 36.0–46.0)
Hemoglobin: 11.3 g/dL — ABNORMAL LOW (ref 12.0–15.0)
Hemoglobin: 11.2 g/dL — ABNORMAL LOW (ref 12.0–15.0)
MCH: 29 pg (ref 26.0–34.0)
MCH: 28.1 pg (ref 26.0–34.0)
MCHC: 31 g/dL (ref 30.0–36.0)
MCHC: 31.5 g/dL (ref 30.0–36.0)
MCV: 93.8 fL (ref 80.0–100.0)
MCV: 89.2 fL (ref 80.0–100.0)
Platelets: 165 10*3/uL (ref 150–400)
Platelets: 192 10*3/uL (ref 150–400)
RBC: 3.89 MIL/uL (ref 3.87–5.11)
RBC: 3.98 MIL/uL (ref 3.87–5.11)
RDW: 14.4 % (ref 11.5–15.5)
RDW: 14.2 % (ref 11.5–15.5)
WBC: 8.2 10*3/uL (ref 4.0–10.5)
WBC: 7.9 10*3/uL (ref 4.0–10.5)
nRBC: 0 % (ref 0.0–0.2)
nRBC: 0 % (ref 0.0–0.2)

## 2020-08-20 LAB — BASIC METABOLIC PANEL
Anion gap: 11 (ref 5–15)
Anion gap: 10 (ref 5–15)
BUN: 30 mg/dL — ABNORMAL HIGH (ref 8–23)
BUN: 30 mg/dL — ABNORMAL HIGH (ref 8–23)
CO2: 22 mmol/L (ref 22–32)
CO2: 22 mmol/L (ref 22–32)
Calcium: 8.4 mg/dL — ABNORMAL LOW (ref 8.9–10.3)
Calcium: 8.6 mg/dL — ABNORMAL LOW (ref 8.9–10.3)
Chloride: 106 mmol/L (ref 98–111)
Chloride: 108 mmol/L (ref 98–111)
Creatinine, Ser: 2.23 mg/dL — ABNORMAL HIGH (ref 0.44–1.00)
Creatinine, Ser: 2.07 mg/dL — ABNORMAL HIGH (ref 0.44–1.00)
GFR, Estimated: 23 mL/min — ABNORMAL LOW (ref >60)
GFR, Estimated: 25 mL/min — ABNORMAL LOW (ref >60)
Glucose, Bld: 100 mg/dL — ABNORMAL HIGH (ref 70–99)
Glucose, Bld: 115 mg/dL — ABNORMAL HIGH (ref 70–99)
Potassium: 4.2 mmol/L (ref 3.5–5.1)
Potassium: 4.2 mmol/L (ref 3.5–5.1)
Sodium: 139 mmol/L (ref 135–145)
Sodium: 140 mmol/L (ref 135–145)

## 2020-08-20 LAB — CK: Total CK: 223 U/L (ref 38–234)

## 2020-08-20 LAB — RESP PANEL BY RT-PCR (FLU A&B, COVID) ARPGX2
Influenza A by PCR: NEGATIVE
Influenza B by PCR: NEGATIVE
SARS Coronavirus 2 by RT PCR: NEGATIVE

## 2020-08-20 MED ORDER — ONDANSETRON HCL 4 MG/2ML IJ SOLN: 4.0000 mg | Freq: Four times a day (QID) | INTRAMUSCULAR | Status: DC | PRN

## 2020-08-20 MED ORDER — TRAMADOL HCL 50 MG PO TABS
50.0000 mg | ORAL_TABLET | Freq: Three times a day (TID) | ORAL | Status: DC
  Administered 2020-08-20 – 2020-08-23 (×10): 50 mg via ORAL
  Filled 2020-08-20 (×10): qty 1

## 2020-08-20 MED ORDER — ATORVASTATIN CALCIUM 10 MG PO TABS
10.0000 mg | ORAL_TABLET | Freq: Every day | ORAL | Status: DC
  Administered 2020-08-20 – 2020-08-22 (×3): 10 mg via ORAL
  Filled 2020-08-20 (×3): qty 1

## 2020-08-20 MED ORDER — OXYCODONE HCL 5 MG PO TABS
5.0000 mg | ORAL_TABLET | Freq: Once | ORAL | Status: AC
  Administered 2020-08-20: 5 mg via ORAL
  Filled 2020-08-20: qty 1

## 2020-08-20 MED ORDER — POLYETHYLENE GLYCOL 3350 17 G PO PACK
17.0000 g | PACK | Freq: Two times a day (BID) | ORAL | Status: DC | PRN
  Administered 2020-08-22 – 2020-08-23 (×2): 17 g via ORAL
  Filled 2020-08-20 (×2): qty 1

## 2020-08-20 MED ORDER — ACETAMINOPHEN 325 MG PO TABS: 650.0000 mg | ORAL_TABLET | Freq: Four times a day (QID) | ORAL | Status: DC | PRN

## 2020-08-20 MED ORDER — OXYCODONE HCL 5 MG PO TABS
5.0000 mg | ORAL_TABLET | Freq: Four times a day (QID) | ORAL | Status: DC | PRN
  Administered 2020-08-20 – 2020-08-23 (×5): 5 mg via ORAL
  Filled 2020-08-20 (×5): qty 1

## 2020-08-20 MED ORDER — ALBUTEROL SULFATE HFA 108 (90 BASE) MCG/ACT IN AERS: 1.0000 | INHALATION_SPRAY | Freq: Four times a day (QID) | RESPIRATORY_TRACT | Status: DC | PRN

## 2020-08-20 MED ORDER — PREGABALIN 75 MG PO CAPS
150.0000 mg | ORAL_CAPSULE | Freq: Two times a day (BID) | ORAL | Status: DC
  Administered 2020-08-20 – 2020-08-23 (×7): 150 mg via ORAL
  Filled 2020-08-20 (×7): qty 2

## 2020-08-20 MED ORDER — SENNOSIDES-DOCUSATE SODIUM 8.6-50 MG PO TABS: 1.0000 | ORAL_TABLET | Freq: Two times a day (BID) | ORAL | Status: DC | PRN

## 2020-08-20 MED ORDER — HEPARIN SODIUM (PORCINE) 5000 UNIT/ML IJ SOLN
5000.0000 [IU] | Freq: Three times a day (TID) | INTRAMUSCULAR | Status: DC
  Administered 2020-08-20 – 2020-08-23 (×10): 5000 [IU] via SUBCUTANEOUS
  Filled 2020-08-20 (×10): qty 1

## 2020-08-20 MED ORDER — ONDANSETRON HCL 4 MG PO TABS
4.0000 mg | ORAL_TABLET | Freq: Four times a day (QID) | ORAL | Status: DC | PRN
Start: 2020-08-20 — End: 2020-08-23

## 2020-08-20 MED ORDER — HYDROMORPHONE HCL 1 MG/ML IJ SOLN
0.5000 mg | INTRAMUSCULAR | Status: DC | PRN
Start: 2020-08-20 — End: 2020-08-23
  Administered 2020-08-20 (×2): 0.5 mg via INTRAVENOUS
  Filled 2020-08-20 (×2): qty 0.5

## 2020-08-20 MED ORDER — LACTATED RINGERS IV SOLN: INTRAVENOUS | Status: AC

## 2020-08-20 MED ORDER — ACETAMINOPHEN 650 MG RE SUPP: 650.0000 mg | Freq: Four times a day (QID) | RECTAL | Status: DC | PRN

## 2020-08-20 MED ORDER — MOMETASONE FURO-FORMOTEROL FUM 200-5 MCG/ACT IN AERO
2.0000 | INHALATION_SPRAY | Freq: Two times a day (BID) | RESPIRATORY_TRACT | Status: DC
  Administered 2020-08-20 – 2020-08-23 (×6): 2 via RESPIRATORY_TRACT
  Filled 2020-08-20: qty 8.8

## 2020-08-20 MED ORDER — ASPIRIN EC 81 MG PO TBEC
81.0000 mg | DELAYED_RELEASE_TABLET | Freq: Every day | ORAL | Status: DC
  Administered 2020-08-20 – 2020-08-23 (×4): 81 mg via ORAL
  Filled 2020-08-20 (×4): qty 1

## 2020-08-20 MED ORDER — ACETAMINOPHEN 500 MG PO TABS
1000.0000 mg | ORAL_TABLET | Freq: Three times a day (TID) | ORAL | Status: DC
  Administered 2020-08-20 – 2020-08-23 (×10): 1000 mg via ORAL
  Filled 2020-08-20 (×10): qty 2

## 2020-08-20 MED ORDER — SODIUM CHLORIDE 0.9 % IV BOLUS: 1000.0000 mL | Freq: Once | INTRAVENOUS | Status: DC

## 2020-08-20 NOTE — ED Notes (Signed)
Patient to X-ray

## 2020-08-20 NOTE — ED Provider Notes (Signed)
[facility] Emergency Department Provider Note MRN:  [id number]  Arrival date & time: 08/20/20     Chief Complaint   Fall   History of Present Illness   Sandra Brennan is a 75 y.o. year-old female with a history of COPD, hypertension, CAD, diabetes presenting to the ED with chief complaint of fall.  Patient was standing on a dresser trying to hang a curtain rod when she slipped on a piece of plastic and fell.  After falling to the ground the dresser fell on top of her.  She is endorsing head trauma but no loss of consciousness, she has neck pain and lower back pain.  She has bilateral shoulder pain.  She denies chest pain or abdominal pain.  She has bilateral ankle pain and left knee pain.  Pain is constant, moderate to severe, worse with motion or palpation.  Review of Systems  A complete 10 system review of systems was obtained and all systems are negative except as noted in the HPI and PMH.   Patient's Health History    Past Medical History:  Diagnosis Date   Anemia    Arthritis    "plenty" (08-08-2012)   Asthma    Chronic lower back pain    "they say I need a whole new spinal column" (2012/08/08)   COPD (chronic obstructive pulmonary disease) (HCC)    Degenerative arthritis    "all over" (08/08/2012)   Fx ankle    GERD (gastroesophageal reflux disease)    Gout 05/05/2013   Hypertension    Migraines    "used to; totally stopped when I quit drinking 30 yr ago" (08/08/2012)   Myalgia and myositis    Myocardial infarction [facility]) 1992   08/08/12 "mild MI when son died "   Obesity    Osteoporosis    "all over" (08-Aug-2012)   Shortness of breath    when stressed.   Sleep apnea    "never did fix my machine; haven't used one for 5 years; I've lost 116# since then; no problems now" (08-08-12)   Type II diabetes mellitus ([place])    "borderline; don't test; take Metformin" (Aug 08, 2012)    Past Surgical History:  Procedure Laterality Date   ABDOMINAL HYSTERECTOMY   1990's   GANGLION CYST EXCISION Right 1980's   "wrist" (Aug 08, 2012)   JOINT REPLACEMENT     KNEE LIGAMENT RECONSTRUCTION Right 1980's   TONSILLECTOMY  ~ [place] Left 08-08-2012   TOTAL HIP ARTHROPLASTY Left 08/08/12   Procedure: TOTAL HIP ARTHROPLASTY;  Surgeon: Garald Balding, MD;  Location: [place];  Service: Orthopedics;  Laterality: Left;  Left Total Hip Arthroplasty   TOTAL HIP ARTHROPLASTY Left 08/28/2012   Procedure: Irrigation and Debridement hip ;  Surgeon: Mcarthur Rossetti, MD;  Location: [place];  Service: Orthopedics;  Laterality: Left;   TOTAL KNEE ARTHROPLASTY Left 2001   TOTAL KNEE ARTHROPLASTY Right 2004    Family History  Problem Relation Age of Onset   Arthritis Mother    Arthritis Father    Colon cancer Father    Diabetes Sister    Arthritis Sister    Diabetes Maternal Grandmother    Arthritis Maternal Grandmother    Diabetes Maternal Grandfather    Arthritis Maternal Grandfather     Social History   Socioeconomic History   Marital status: Widowed    Spouse name: Not on file   Number of children: 5   Years of education: PhD  Highest education level: Not on file  Occupational History   Occupation: Retired  Tobacco Use   Smoking status: Former    Packs/day: 0.12    Years: 2.00    Pack years: 0.24    Types: Cigarettes   Smokeless tobacco: Never   Tobacco comments:    08/03/2012 "quit 30 years ago"  Vaping Use   Vaping Use: Never used  Substance and Sexual Activity   Alcohol use: No    Alcohol/week: 0.0 standard drinks    Comment: 08/03/2012 "used to be a beeralcoholic"; stopped ~ 30 yr ago"   Drug use: No   Sexual activity: Never  Other Topics Concern   Not on file  Social History Narrative   Lives at home with her mother and caregiver.   Occasional use of caffeine.   Right-handed.   Social Determinants of Health   Financial Resource Strain: Not on file  Food Insecurity: Not on file  Transportation Needs: Not on file   Physical Activity: Not on file  Stress: Not on file  Social Connections: Not on file  Intimate Partner Violence: Not on file     Physical Exam   Vitals:   08/20/20 0217 08/20/20 0500  BP: 132/73 (!) 146/80  Pulse: 62 67  Resp: 17 16  Temp:  98.7 F (37.1 C)  SpO2: 92% 100%    CONSTITUTIONAL: Well-appearing, NAD NEURO:  Alert and oriented x 3, no focal deficits EYES:  eyes equal and reactive ENT/NECK:  no LAD, no JVD CARDIO: Regular rate, well-perfused, normal S1 and S2 PULM:  CTAB no wheezing or rhonchi GI/GU:  normal bowel sounds, non-distended, non-tender MSK/SPINE: Tenderness to palpation to the bilateral shoulders, bilateral ankles, left knee, lower back, midline cervical spine. SKIN:  no rash, atraumatic PSYCH:  Appropriate speech and behavior  *Additional and/or pertinent findings included in MDM below  Diagnostic and Interventional Summary    EKG Interpretation  Date/Time:    Ventricular Rate:    PR Interval:    QRS Duration:   QT Interval:    QTC Calculation:   R Axis:     Text Interpretation:          Labs Reviewed  CBC - Abnormal; Notable for the following components:      Result Value   Hemoglobin 11.3 (*)    All other components within normal limits  BASIC METABOLIC PANEL - Abnormal; Notable for the following components:   Glucose, Bld 100 (*)    BUN 30 (*)    Creatinine, Ser 2.23 (*)    Calcium 8.4 (*)    GFR, Estimated 23 (*)    All other components within normal limits  CBC - Abnormal; Notable for the following components:   Hemoglobin 11.2 (*)    HCT 35.5 (*)    All other components within normal limits  BASIC METABOLIC PANEL - Abnormal; Notable for the following components:   Glucose, Bld 115 (*)    BUN 30 (*)    Creatinine, Ser 2.07 (*)    Calcium 8.6 (*)    GFR, Estimated 25 (*)    All other components within normal limits  RESP PANEL BY RT-PCR (FLU A&B, COVID) [id number]  CK    CT HEAD WO CONTRAST  Final Result    CT  CERVICAL SPINE WO CONTRAST  Final Result    CT Lumbar Spine Wo Contrast  Final Result    DG Shoulder Left  Final Result    DG Shoulder Right  Final  Result    DG Ankle Complete Right  Final Result    DG Ankle Complete Left  Final Result    DG Knee Complete 4 Views Left  Final Result    DG Pelvis 1-2 Views  Final Result    DG Chest 1 View  Final Result      Medications  aspirin EC tablet 81 mg (has no administration in time range)  traMADol (ULTRAM) tablet 50 mg (has no administration in time range)  atorvastatin (LIPITOR) tablet 10 mg (has no administration in time range)  pregabalin (LYRICA) capsule 150 mg (has no administration in time range)  mometasone-formoterol (DULERA) 200-5 MCG/ACT inhaler 2 puff (has no administration in time range)  albuterol (VENTOLIN HFA) 108 (90 Base) MCG/ACT inhaler 1-2 puff (has no administration in time range)  heparin injection 5,000 Units (5,000 Units Subcutaneous Given 08/20/20 0516)  lactated ringers infusion ( Intravenous New Bag/Given 08/20/20 0415)  acetaminophen (TYLENOL) tablet 650 mg (has no administration in time range)    Or  acetaminophen (TYLENOL) suppository 650 mg (has no administration in time range)  HYDROmorphone (DILAUDID) injection 0.5 mg (0.5 mg Intravenous Given 08/20/20 0516)  ondansetron (ZOFRAN) tablet 4 mg (has no administration in time range)    Or  ondansetron (ZOFRAN) injection 4 mg (has no administration in time range)  oxyCODONE (Oxy IR/ROXICODONE) immediate release tablet 5 mg (5 mg Oral Given 08/20/20 0033)     Procedures  /  Critical Care Procedures  ED Course and Medical Decision Making  I have reviewed the triage vital signs, the nursing notes, and pertinent available records from the EMR.  Listed above are laboratory and imaging tests that I personally ordered, reviewed, and interpreted and then considered in my medical decision making (see below for details).  Will need x-rays and CT imaging to  evaluate for traumatic injuries.  Mechanical fall.  Not anticoagulated, normal vital signs, no significant chest pain or abdominal pain to suggest emergent intra-abdominal or intrathoracic injury.     Imaging reassuring, labs reveal AKI, patient difficulty ambulating at this time due to pain, admitted to medicine.  Barth Kirks. Sedonia Small, [place] [email or web address]  Final Clinical Impressions(s) / ED Diagnoses     ICD-10-CM   1. Fall, initial encounter  W19.XXXA     2. Fall  W19.Merril Abbe DG Chest 1 View    DG Chest 1 View    3. AKI (acute kidney injury) ([place])  N17.9       ED Discharge Orders     None        Discharge Instructions Discussed with and Provided to Patient:   Discharge Instructions   None       Maudie Flakes, MD 08/20/20 [phone]

## 2020-08-20 NOTE — H&P (Signed)
History and Physical    [name] IRC:[id number] DOB: [birth year] DOA: 08/19/2020  PCP: Flossie Buffy, NP   Patient coming from: Home  Chief Complaint: generalized pain after fall at home.   HPI: Sandra Brennan is a 75 y.o. female with medical history significant for asthma, HTN, DM, MI, migraines, OA, anemia who presents after having a fall at home.  She normally ambulates with the aid of a walker.  She was trying to fix a curtain rod and was standing on a dresser to reach the rod.  The dresser fell when she lost her balance.  She fell down on the ground and the dresser fell on top of her.  She states that it was heavy and made of iron.  She tried to move it but it fell back on her again and she was not able to get it off of her.  She laid for approximately an hour under the dresser into her daughter came over to check on her and found her.  She did not have any head trauma and denies any loss of consciousness.  She does report pain in her knees hips right shoulder low back and neck.  Pain is worsened with movement.  She did not take anything for the pain at home.   ED Course: Emergency room patient had multiple x-rays and CT scans and has no fractures.  She has not been able to get up and walk due to the pain and feels like she cannot go home.  Lab work shows a normal CBC.  Sodium of 139 potassium 4.2, chloride 106, bicarb 22, creatinine 2.23, BUN 30 glucose 100.  Hospital service been asked to observe patient and manage overnight.  Review of Systems:  General: Denies fever, chills, weight loss, night sweats.  Denies dizziness.  Denies change in appetite HENT: Denies head trauma, headache, denies change in hearing, tinnitus. Denies nasal congestion. Denies sore throat, sores in mouth.  Denies difficulty swallowing Eyes: Denies blurry vision, pain in eye, drainage.  Denies discoloration of eyes. Neck: Denies pain.  Denies swelling.  Denies pain with movement. Cardiovascular: Denies  chest pain, palpitations.  Denies edema.  Denies orthopnea Respiratory: Denies shortness of breath, cough.  Denies wheezing.  Denies sputum production Gastrointestinal: Denies abdominal pain, swelling.  Denies nausea, vomiting, diarrhea.  Denies melena.  Denies hematemesis. Musculoskeletal: reports generalized pain and limitation of movement.  Denies deformity or swelling.  Genitourinary: Denies pelvic pain.  Denies urinary frequency or hesitancy.  Denies dysuria.  Skin: Denies rash.  Denies petechiae, purpura, ecchymosis. Neurological: Denies syncope. Denies seizure activity. Denies paresthesia. Denies slurred speech, drooping face. Denies visual change. Psychiatric: Denies depression, anxiety. Denies hallucinations.  Past Medical History:  Diagnosis Date   Anemia    Arthritis    "plenty" (08/10/2012)   Asthma    Chronic lower back pain    "they say I need a whole new spinal column" (08-10-2012)   COPD (chronic obstructive pulmonary disease) (HCC)    Degenerative arthritis    "all over" (2012-08-10)   Fx ankle    GERD (gastroesophageal reflux disease)    Gout 05/05/2013   Hypertension    Migraines    "used to; totally stopped when I quit drinking 30 yr ago" (08/10/12)   Myalgia and myositis    Myocardial infarction [facility]) 1992   2012/08/10 "mild MI when son died "   Obesity    Osteoporosis    "all over" (August 10, 2012)   Shortness of breath  when stressed.   Sleep apnea    "never did fix my machine; haven't used one for 5 years; I've lost 116# since then; no problems now" (08/03/2012)   Type II diabetes mellitus ([place])    "borderline; don't test; take Metformin" (08/03/2012)    Past Surgical History:  Procedure Laterality Date   ABDOMINAL HYSTERECTOMY  1990's   GANGLION CYST EXCISION Right 1980's   "wrist" (08/03/2012)   JOINT REPLACEMENT     KNEE LIGAMENT RECONSTRUCTION Right 1980's   TONSILLECTOMY  ~ [place] Left 08/03/2012   TOTAL HIP ARTHROPLASTY Left 08/03/2012    Procedure: TOTAL HIP ARTHROPLASTY;  Surgeon: Garald Balding, MD;  Location: [place];  Service: Orthopedics;  Laterality: Left;  Left Total Hip Arthroplasty   TOTAL HIP ARTHROPLASTY Left 08/28/2012   Procedure: Irrigation and Debridement hip ;  Surgeon: Mcarthur Rossetti, MD;  Location: [place];  Service: Orthopedics;  Laterality: Left;   TOTAL KNEE ARTHROPLASTY Left 2001   TOTAL KNEE ARTHROPLASTY Right 2004    Social History  reports that she has quit smoking. Her smoking use included cigarettes. She has a 0.24 pack-year smoking history. She has never used smokeless tobacco. She reports that she does not drink alcohol and does not use drugs.  Allergies  Allergen Reactions   Cymbalta [Duloxetine Hcl] Anaphylaxis   Pineapple Shortness Of Breath   Sulfa Antibiotics Hives, Shortness Of Breath and Itching    REACTION: unknown   Influenza Vaccines Hives and Itching   Penicillins Hives   Theophyllines Hives    Family History  Problem Relation Age of Onset   Arthritis Mother    Arthritis Father    Colon cancer Father    Diabetes Sister    Arthritis Sister    Diabetes Maternal Grandmother    Arthritis Maternal Grandmother    Diabetes Maternal Grandfather    Arthritis Maternal Grandfather      Prior to Admission medications   Medication Sig Start Date End Date Taking? Authorizing Provider  albuterol (VENTOLIN HFA) 108 (90 Base) MCG/ACT inhaler INHALE 1-2 PUFFS INTO THE LUNGS EVERY 6 (SIX) HOURS AS NEEDED FOR WHEEZING OR SHORTNESS OF BREATH 06/04/20  Yes Nche, Charlene Brooke, NP  allopurinol (ZYLOPRIM) 100 MG tablet TAKE 1 TABLET BY MOUTH EVERY DAY 01/25/20  Yes Nche, Charlene Brooke, NP  aspirin EC 81 MG tablet Take 81 mg by mouth daily.   Yes [provider]  atorvastatin (LIPITOR) 10 MG tablet TAKE 1 TABLET (10 MG TOTAL) BY MOUTH DAILY AT 6 PM. 01/25/20  Yes Nche, Charlene Brooke, NP  Carboxymethylcellulose Sodium (EYE DROPS OP) Apply to eye. Lubricant eye drops   Yes [provider]  cetirizine (ZYRTEC) 10 MG tablet Take 10 mg by mouth daily as needed.   Yes [provider]  cholecalciferol (VITAMIN D3) 25 MCG (1000 UNIT) tablet Take 1,000 Units by mouth daily.   Yes [provider]  cycloSPORINE (RESTASIS) 0.05 % ophthalmic emulsion Restasis 0.05 % eye drops in a dropperette   Yes [provider]  lisinopril-hydrochlorothiazide (ZESTORETIC) 20-12.5 MG tablet Take 1 tablet by mouth daily. 06/26/20  Yes Nche, Charlene Brooke, NP  Multiple Vitamin (MULTIVITAMIN) tablet Take 1 tablet by mouth daily.   Yes [provider]  omeprazole (PRILOSEC) 20 MG capsule TAKE 1 CAPSULE BY MOUTH EVERY DAY 06/04/20  Yes Nche, Charlene Brooke, NP  OVER THE COUNTER MEDICATION Cystic magnesium   Yes [provider]  pregabalin (LYRICA) 150 MG  capsule Take 1 capsule (150 mg total) by mouth 2 (two) times daily. 08/02/20  Yes Bayard Hugger, NP  Rimegepant Sulfate (NURTEC) 75 MG TBDP Take 75 mg by mouth as needed (take 1 at onset of headache, max is 75 in 24 hours). 07/24/20  Yes Suzzanne Cloud, NP  Sennosides 8.6 MG CAPS Take 1 capsule by mouth daily.   Yes [provider]  SYMBICORT 160-4.5 MCG/ACT inhaler INHALE 2 PUFFS INTO THE LUNGS IN THE MORNING AND AT BEDTIME. RINSE MOUTH AFTER EACH USE 10/06/19  Yes Nche, Charlene Brooke, NP  traMADol (ULTRAM) 50 MG tablet Take 1 tablet (50 mg total) by mouth 3 (three) times daily. 08/02/20  Yes Bayard Hugger, NP  nystatin (MYCOSTATIN/NYSTOP) powder Apply 1 application topically 3 (three) times daily. Patient not taking: No sig reported 03/26/20   Flossie Buffy, NP    Physical Exam: Vitals:   08/19/20 2341 08/19/20 2346 08/20/20 0217  BP:  139/84 132/73  Pulse:  71 62  Resp:  16 17  Temp:  97.7 F (36.5 C)   TempSrc:  Oral   SpO2:  98% 92%  Weight: 135.8 kg    Height: 4\' 11"  (1.499 m)      Constitutional: NAD, calm, comfortable Vitals:   08/19/20 2341 08/19/20 2346 08/20/20 0217   BP:  139/84 132/73  Pulse:  71 62  Resp:  16 17  Temp:  97.7 F (36.5 C)   TempSrc:  Oral   SpO2:  98% 92%  Weight: 135.8 kg    Height: 4\' 11"  (1.499 m)     General: WDWN, Alert and oriented x3.  Eyes: EOMI, PERRL, conjunctivae normal.  Sclera nonicteric HENT:  /AT, external ears normal.  Nares patent without epistasis.  Mucous membranes are moist.  Neck: Soft, normal range of motion, supple, no masses, no thyromegaly.  Trachea midline Respiratory: clear to auscultation bilaterally, no wheezing, no crackles. Normal respiratory effort. No accessory muscle use.  Cardiovascular: Regular rate and rhythm, no murmurs / rubs / gallops. No extremity edema. 2+ pedal pulses.  Abdomen: Soft, no tenderness, nondistended, no rebound or guarding.  No masses palpated. Morbidly obese. Bowel sounds normoactive Musculoskeletal: Has limited range of motion due to pain. Has pain in knees, hips, back, right shoulder. no cyanosis.  Normal muscle tone.  Skin: Warm, dry, intact no rashes, lesions, ulcers. No induration Neurologic: CN 2-12 grossly intact.  Normal speech.  Sensation intact, Strength 4/5 in all extremities.   Psychiatric: Normal judgment and insight.  Normal mood.    Labs on Admission: I have personally reviewed following labs and imaging studies  CBC: Recent Labs  Lab 08/20/20 0210  WBC 8.2  HGB 11.3*  HCT 36.5  MCV 93.8  PLT 478    Basic Metabolic Panel: Recent Labs  Lab 08/20/20 0210  NA 139  K 4.2  CL 106  CO2 22  GLUCOSE 100*  BUN 30*  CREATININE 2.23*  CALCIUM 8.4*    GFR: Estimated Creatinine Clearance: 28.4 mL/min (A) (by C-G formula based on SCr of 2.23 mg/dL (H)).  Liver Function Tests: No results for input(s): AST, ALT, ALKPHOS, BILITOT, PROT, ALBUMIN in the last 168 hours.  Urine analysis:    Component Value Date/Time   COLORURINE YELLOW 04/09/2018 [place] 04/09/2018 1549   LABSPEC 1.018 04/09/2018 1549   PHURINE 6.5 04/09/2018  1549   GLUCOSEU NEGATIVE 04/09/2018 1549   HGBUR NEGATIVE 04/09/2018 1549   BILIRUBINUR neg 09/04/2016 1107  KETONESUR NEGATIVE 04/09/2018 1549   PROTEINUR NEGATIVE 04/09/2018 1549   UROBILINOGEN 0.2 09/04/2016 1107   UROBILINOGEN 0.2 05/05/2013 1322   NITRITE neg 09/04/2016 1107   NITRITE NEGATIVE 03/30/2016 1245   LEUKOCYTESUR Small (1+) (A) 09/04/2016 1107    Radiological Exams on Admission: DG Chest 1 View  Result Date: 08/20/2020 CLINICAL DATA:  Status post fall. EXAM: CHEST  1 VIEW COMPARISON:  Chest x-ray 05/23/2020 FINDINGS: The heart size and mediastinal contours are unchanged. No focal consolidation. No pulmonary edema. No pleural effusion. No pneumothorax. No acute osseous abnormality. Redemonstration of severe bilateral shoulder degenerative changes. IMPRESSION: No active disease. Electronically Signed   By: Iven Finn M.D.   On: 08/20/2020 02:23   DG Pelvis 1-2 Views  Result Date: 08/20/2020 CLINICAL DATA:  Fall. EXAM: PELVIS - 1-2 VIEW COMPARISON:  None. FINDINGS: Previous left hip arthroplasty. Moderate degenerative changes are noted in right hip. No signs of acute fracture or dislocation. Note: The left iliac wing is excluded from the field of view. Degenerative changes are noted involving the left SI joint. IMPRESSION: 1. No acute findings. 2. Moderate right hip osteoarthritis. 3. Left iliac wing excluded from the field of view. Electronically Signed   By: Kerby Moors M.D.   On: 08/20/2020 02:17   DG Shoulder Right  Result Date: 08/20/2020 CLINICAL DATA:  Fall EXAM: RIGHT SHOULDER - 2+ VIEW COMPARISON:  09/05/2015 FINDINGS: Advanced degenerative change of the humeral head and acromioclavicular joint is unchanged. There is no acute fracture or dislocation. IMPRESSION: No acute fracture or dislocation of the right shoulder. Electronically Signed   By: Ulyses Jarred M.D.   On: 08/20/2020 02:09   DG Ankle Complete Left  Result Date: 08/20/2020 CLINICAL DATA:  Status  post fall. EXAM: LEFT ANKLE COMPLETE - 3+ VIEW COMPARISON:  None. FINDINGS: No evidence of fracture, dislocation, or joint effusion. Degenerative changes of the bones of the left ankle and foot. Hallux valgus noted. Plantar calcaneal spur. No aggressive appearing focal bone abnormality. Anterior left ankle subcutaneus soft tissue edema. Vascular calcifications. IMPRESSION: No acute displaced fracture or dislocation. Electronically Signed   By: Iven Finn M.D.   On: 08/20/2020 02:05   DG Ankle Complete Right  Result Date: 08/20/2020 CLINICAL DATA:  Status post fall. EXAM: RIGHT ANKLE - COMPLETE 3+ VIEW COMPARISON:  04/23/2016 FINDINGS: No signs of acute fracture or dislocation. Marked, chronic arthro pathic changes involving the right ankle identified with chronic collapse of the talus with increased sclerosis and bony hypertrophy centered around the tibiotalar joint. Progressive increased sclerosis involving the navicular bone is identified which appears partially collapsed with surrounding secondary degenerative changes. Remote fracture deformity involving the distal tibia is identified which appears unchanged from previous exam. Medial soft tissue swelling is identified. Vascular calcifications noted. IMPRESSION: 1. No acute findings. 2. Marked, chronic arthropathic changes involving the right ankle. Findings concerning for neuropathic arthropathy. 3. Progressive sclerosis involving the navicular bone which appears partially collapsed with surrounding secondary degenerative changes. 4. Medial soft tissue swelling. Electronically Signed   By: Kerby Moors M.D.   On: 08/20/2020 02:14   CT HEAD WO CONTRAST  Result Date: 08/20/2020 CLINICAL DATA:  Status post fall EXAM: CT HEAD WITHOUT CONTRAST CT CERVICAL SPINE WITHOUT CONTRAST TECHNIQUE: Multidetector CT imaging of the head and cervical spine was performed following the standard protocol without intravenous contrast. Multiplanar CT image  reconstructions of the cervical spine were also generated. COMPARISON:  CT head 11/17/2008, MR head 03/30/2016, CT head and C-spine  03/30/2016 FINDINGS: CT HEAD FINDINGS Brain: Patchy and confluent areas of decreased attenuation are noted throughout the deep and periventricular white matter of the cerebral hemispheres bilaterally, compatible with chronic microvascular ischemic disease. No evidence of large-territorial acute infarction. No parenchymal hemorrhage. No mass lesion. No extra-axial collection. No mass effect or midline shift. No hydrocephalus. Basilar cisterns are patent. Vascular: No hyperdense vessel. Skull: Hyperostosis frontalis interna. No acute fracture or focal lesion. Sinuses/Orbits: Chronic fluid noted within the bilateral mastoid air cells. Paranasal sinuses cells are clear. No fluid noted within the middle ears bilaterally. The orbits are unremarkable. Other: None. CT CERVICAL SPINE FINDINGS Alignment: Normal. Skull base and vertebrae: Markedly limited evaluation of cervical spine starting at the C3 level due to quantum mottle artifact. Multilevel degenerative changes of the spine. Multilevel degenerative changes of the spine with associated at least moderate multilevel osseous neural foraminal stenosis. No definite findings of severe osseous central canal stenosis with limited evaluation. No acute fracture. No aggressive appearing focal osseous lesion or focal pathologic process. Soft tissues and spinal canal: No prevertebral fluid or swelling. No visible canal hematoma. Upper chest: Unremarkable. Other: None. IMPRESSION: 1. No acute intracranial abnormality. 2. No acute displaced fracture or traumatic listhesis of the cervical spine at the C1 and C2 level. Markedly limited evaluation of cervical spine starting at the C3 level due to quantum mottle artifact. Electronically Signed   By: Iven Finn M.D.   On: 08/20/2020 02:12   CT CERVICAL SPINE WO CONTRAST  Result Date:  08/20/2020 CLINICAL DATA:  Status post fall EXAM: CT HEAD WITHOUT CONTRAST CT CERVICAL SPINE WITHOUT CONTRAST TECHNIQUE: Multidetector CT imaging of the head and cervical spine was performed following the standard protocol without intravenous contrast. Multiplanar CT image reconstructions of the cervical spine were also generated. COMPARISON:  CT head 11/17/2008, MR head 03/30/2016, CT head and C-spine 03/30/2016 FINDINGS: CT HEAD FINDINGS Brain: Patchy and confluent areas of decreased attenuation are noted throughout the deep and periventricular white matter of the cerebral hemispheres bilaterally, compatible with chronic microvascular ischemic disease. No evidence of large-territorial acute infarction. No parenchymal hemorrhage. No mass lesion. No extra-axial collection. No mass effect or midline shift. No hydrocephalus. Basilar cisterns are patent. Vascular: No hyperdense vessel. Skull: Hyperostosis frontalis interna. No acute fracture or focal lesion. Sinuses/Orbits: Chronic fluid noted within the bilateral mastoid air cells. Paranasal sinuses cells are clear. No fluid noted within the middle ears bilaterally. The orbits are unremarkable. Other: None. CT CERVICAL SPINE FINDINGS Alignment: Normal. Skull base and vertebrae: Markedly limited evaluation of cervical spine starting at the C3 level due to quantum mottle artifact. Multilevel degenerative changes of the spine. Multilevel degenerative changes of the spine with associated at least moderate multilevel osseous neural foraminal stenosis. No definite findings of severe osseous central canal stenosis with limited evaluation. No acute fracture. No aggressive appearing focal osseous lesion or focal pathologic process. Soft tissues and spinal canal: No prevertebral fluid or swelling. No visible canal hematoma. Upper chest: Unremarkable. Other: None. IMPRESSION: 1. No acute intracranial abnormality. 2. No acute displaced fracture or traumatic listhesis of the  cervical spine at the C1 and C2 level. Markedly limited evaluation of cervical spine starting at the C3 level due to quantum mottle artifact. Electronically Signed   By: Iven Finn M.D.   On: 08/20/2020 02:12   CT Lumbar Spine Wo Contrast  Result Date: 08/20/2020 CLINICAL DATA:  Status post fall.  Low back pain. EXAM: CT LUMBAR SPINE WITHOUT CONTRAST TECHNIQUE: Multidetector CT imaging  of the lumbar spine was performed without intravenous contrast administration. Multiplanar CT image reconstructions were also generated. COMPARISON:  MRI lumbar spine 08/12/2019 FINDINGS: Segmentation: 5 lumbar type vertebrae. Alignment: Normal. Vertebrae: Limited evaluation due to quantum mottle artifact. Severe multilevel degenerative changes of the spine including intervertebral disc space vacuum phenomenon, facet arthropathy, osteophyte formation, posterior disc osteophyte formation, fusion of the L1-L2 vertebral bodies. Associated multilevel moderate to severe osseous neural foraminal stenosis as well as multilevel severe osseous central canal stenosis. No definite acute displaced fracture or focal pathologic process. Paraspinal and other soft tissues: Negative. Other: Vacuum phenomenon of the sacroiliac joints. No ankylosis. Visualize portions of the sacral spine grossly unremarkable. Atherosclerotic plaque of the aorta. Visualized portion of the retroperitoneum unremarkable. IMPRESSION: 1. No acute displaced fracture or traumatic listhesis of the lumbar spine with limited evaluation due to quantum mottle artifact. 2. Severe multilevel degenerative changes of the spine with redemonstration of moderate to severe multilevel osseous neural foraminal stenosis and multilevel severe osseous spinal canal stenosis. 3. Aortic Atherosclerosis (ICD10-I70.0). Electronically Signed   By: Iven Finn M.D.   On: 08/20/2020 02:21   DG Shoulder Left  Result Date: 08/20/2020 CLINICAL DATA:  Status post fall. EXAM: LEFT SHOULDER -  2+ VIEW COMPARISON:  05/23/2020 FINDINGS: No dislocation identified. No fracture. Marked narrowing of the glenohumeral joint is identified with increased sclerosis of the humeral head and bony hypertrophy along the margins of the glenoid. The joint space appears indistinct. Cannot exclude underlying inflammatory arthropathy. IMPRESSION: 1. No fracture or dislocation. 2. Marked narrowing of the glenohumeral joint with bony overgrowth. The joint space is indistinct. Cannot exclude inflammatory arthropathy. Electronically Signed   By: Kerby Moors M.D.   On: 08/20/2020 02:09   DG Knee Complete 4 Views Left  Result Date: 08/20/2020 CLINICAL DATA:  Fall. EXAM: LEFT KNEE - COMPLETE 4+ VIEW COMPARISON:  None FINDINGS: Previous left total knee arthroplasty. No acute fracture or dislocation identified. No joint effusion identified. Several loose bodies identified within the posterior joint space. IMPRESSION: 1. No acute findings. 2. Loose bodies within the posterior joint space. Electronically Signed   By: Kerby Moors M.D.   On: 08/20/2020 02:15      Assessment/Plan Principal Problem:   AKI (acute kidney injury)  Ms. Sergent is placed on Med/Surg for observation.  Provided with IVF hydration overnight and will recheck electrolytes and renal function in am.   Active Problems:   Osteoarthritis of left hip Has pain since fall. No fracture.  Consult PT for evaluation in am.    Hypertension Hold Lisinopril HCT until renal function improves. Monitor BP.    Spinal stenosis, lumbar region, without neurogenic claudication Pain control provided    Knee pain No fracture. Pain control provided.     Asthma, chronic Symbicort changed to [facility] due to formulary. Albuterol MDI as needed.     Morbid obesity with BMI of 60.0-69.9, adult  Chronic.     DVT prophylaxis: Heparin for DVT prophylaxis  Code Status:   Full Code Family Communication:  Diagnosis and plan discussed with patient and her daughter  who is at bedside.  They verbalized understanding agree with plan.  Further recommendations to follow as clinically indicated Disposition Plan:   Patient is from:  Home  Anticipated DC to:  Home  Anticipated DC date:  Anticipate less than 2 midnight stay  Anticipated DC barriers: No barriers to discharge identified at this time  Admission status:  Observation   Eben Burow MD [facility]  How to contact the [facility] Attending or Consulting provider [place] or covering provider during after hours [place], for this patient?   Check the care team in [facility] and look for a) attending/consulting [facility] provider listed and b) the [facility] team listed Log into [email or web address] and use [place]'s universal password to access. If you do not have the password, please contact the hospital operator. Locate the [facility] provider you are looking for under [facility] and page to a number that you can be directly reached. If you still have difficulty reaching the provider, please page the [facility] (Director on Call) for the Hospitalists listed on amion for assistance.  08/20/2020, 3:58 AM

## 2020-08-20 NOTE — ED Notes (Signed)
RN attempted to get blood work. Unsuccessful. NT unsuccessful x2.

## 2020-08-20 NOTE — Evaluation (Signed)
Occupational Therapy Evaluation Patient Details Name: Sandra Brennan MRN: [id number] DOB: [birth year] Today's Date: 08/20/2020    History of Present Illness 75 y.o. female with medical history significant for asthma, HTN, DM, MI, migraines, OA, anemia who presents after having a fall at home.  She normally ambulates with the aid of a walker.  She was trying to fix a curtain rod and was standing on a dresser to reach the rod.  The dresser fell when she lost her balance.  She fell down on the ground and the dresser fell on top of her. xrays and CT scans all negative for fxs   Clinical Impression   Sandra Brennan is a 75 year old woman who presents with generalized weakness, decreased activity tolerance, impaired balance and pain resulting in a decline in baseline functional abilities. Today patient requiring min assist for standing, increased assistance for ADLs, and activity limited to standing and transferring back to bed after pericare activity. Patient will benefit from skilled OT services while in hospital to improve deficits and learn compensatory strategies as needed in order to return PLOF.      Follow Up Recommendations  Home health OT    Equipment Recommendations  None recommended by OT    Recommendations for Other Services       Precautions / Restrictions Precautions Precautions: Fall Restrictions Weight Bearing Restrictions: No      Mobility Bed Mobility Overal bed mobility: Needs Assistance Bed Mobility: Sit to Supine     Supine to sit: Mod assist;+2 for safety/equipment;+2 for physical assistance Sit to supine: Mod assist   General bed mobility comments: OOB in chair. Mod assist for LEs to return to supine.    Transfers Overall transfer level: Needs assistance Equipment used: Rolling walker (2 wheeled) Transfers: Sit to/from [facility] Sit to Stand: Min assist;+2 safety/equipment Stand pivot transfers: Min assist;+2 safety/equipment        General transfer comment: cues for hand placement, assist to rise and complete transition to RW    Balance Overall balance assessment: Mild deficits observed, not formally tested Sitting-balance support: Feet supported;No upper extremity supported Sitting balance-Leahy Scale: Fair       Standing balance-Leahy Scale: Poor Standing balance comment: reliant on UEs                           ADL either performed or assessed with clinical judgement   ADL Overall ADL's : Needs assistance/impaired Eating/Feeding: Independent   Grooming: Set up;Sitting   Upper Body Bathing: Set up;Sitting   Lower Body Bathing: Sit to/from stand;Moderate assistance   Upper Body Dressing : Set up;Sitting   Lower Body Dressing: Moderate assistance;Sit to/from stand   Toilet Transfer: Minimal assistance;BSC;RW   Toileting- Clothing Manipulation and Hygiene: Minimal assistance;Sit to/from stand       Functional mobility during ADLs: Minimal assistance;Rolling walker       Vision Patient Visual Report: No change from baseline       Perception     Praxis      Pertinent Vitals/Pain Pain Assessment: Faces Faces Pain Scale: Hurts a little bit Pain Descriptors / Indicators: Grimacing Pain Intervention(s): Limited activity within patient's tolerance     Hand Dominance Right   Extremity/Trunk Assessment Upper Extremity Assessment Upper Extremity Assessment: RUE deficits/detail;LUE deficits/detail RUE Deficits / Details: grossly functional ROM, shoulders approx to 90 degrees, shoulder pain limited ROM; shoulder strength 3+/5, elbow 4/5, wrist 5/5, grip 5/5 LUE Deficits /  Details: grossly functional ROM, shoulders approx to 90 degrees, shoulder pain limited ROM; shoulder strength 3+/5, elbow 4/5, wrist 5/5, grip 5/5   Lower Extremity Assessment Lower Extremity Assessment: Defer to PT evaluation RLE Deficits / Details: knee flexion to ~ 50 degrees, strength grossly 2+ to 3/5 LLE  Deficits / Details: knee flexion to ~ 50 degrees, strength grossly 2+ to 3/5   Cervical / Trunk Assessment Cervical / Trunk Assessment: Normal   Communication Communication Communication: No difficulties   Cognition Arousal/Alertness: Awake/alert Behavior During Therapy: WFL for tasks assessed/performed Overall Cognitive Status: Within Functional Limits for tasks assessed                                     General Comments       Exercises     Shoulder Instructions      Home Living Family/patient expects to be discharged to:: Private residence Living Arrangements: Parent   Type of Home: Apartment Home Access: Level entry     Home Layout: One level     Bathroom Shower/Tub: Teacher, early years/pre: Handicapped height Bathroom Accessibility: Yes   Home Equipment: Environmental consultant - 4 wheels;Bedside commode;Grab bars - toilet;Grab bars - tub/shower          Prior Functioning/Environment Level of Independence: Independent with assistive device(s)        Comments: rollator on "bad days"        OT Problem List: Decreased activity tolerance;Decreased strength;Decreased knowledge of use of DME or AE;Obesity;Pain      OT Treatment/Interventions: Self-care/ADL training;Therapeutic exercise;DME and/or AE instruction;Therapeutic activities;Balance training;Patient/family education    OT Goals(Current goals can be found in the care plan section) Acute Rehab OT Goals Patient Stated Goal: get better OT Goal Formulation: With patient Time For Goal Achievement: 09/03/20 Potential to Achieve Goals: Good  OT Frequency: Min 2X/week   Barriers to D/C:            Co-evaluation              AM-PAC OT "6 Clicks" Daily Activity     Outcome Measure Help from another person eating meals?: None Help from another person taking care of personal grooming?: A Little Help from another person toileting, which includes using toliet, bedpan, or urinal?: A  Little Help from another person bathing (including washing, rinsing, drying)?: A Lot Help from another person to put on and taking off regular upper body clothing?: A Little Help from another person to put on and taking off regular lower body clothing?: A Lot 6 Click Score: 17   End of Session Equipment Utilized During Treatment: Rolling walker Nurse Communication: Mobility status  Activity Tolerance: Patient tolerated treatment well Patient left: in bed;with call bell/phone within reach  OT Visit Diagnosis: Other abnormalities of gait and mobility (R26.89);Pain                Time: 1435-1457 OT Time Calculation (min): 22 min Charges:  OT General Charges $OT Visit: 1 Visit OT Evaluation $OT Eval Low Complexity: 1 Low  Bran Aldridge, OTR/L [place]  Office [phone] Pager: [place] 08/20/2020, 4:53 PM

## 2020-08-20 NOTE — Plan of Care (Signed)
  Problem: Coping: Goal: Level of anxiety will decrease Outcome: Progressing   Problem: Pain Managment: Goal: General experience of comfort will improve Outcome: Progressing   Problem: Safety: Goal: Ability to remain free from injury will improve Outcome: Progressing   

## 2020-08-20 NOTE — Progress Notes (Signed)
PROGRESS NOTE  Sandra Brennan UDJ:[id number] DOB: 01/30/1946   PCP: Flossie Buffy, NP  Patient is from: Home.  Lives with and takes care of her mother and grandchild.  Uses walker at baseline.  DOA: 08/19/2020 LOS: 0  Chief complaints:  Chief Complaint  Patient presents with   Fall     Brief Narrative / Interim history: 75 year old F with PMH of osteoarthritis/chronic back pain with radiculopathy followed at pain clinic, COPD not on oxygen, OSA not on CPAP, HTN, CAD, anemia and diet-controlled diabetes presenting with back pain, right shoulder pain, neck pain, bilateral knee pain and hip pain after she fell off a dresser while trying to fix a curtain rod.  The dresser fell on her and she was not able to get it off until her daughter arrived about 45 minutes later.  Denies hitting her head or loss of consciousness.  No prodromes leading to fall.  In ED, hemodynamically stable.  Had multiple imaging including CXR, DG left and right shoulder, left and right ankle, left knee and pelvis, and CT head, cervical and lumbar spine which did not show acute finding other than significant osteoarthritis.  Blood work significant for AKI with creatinine of 2.23 from baseline of 1.17.  BUN was 30.  Admission requested for pain control and AKI.    Subjective: No major events overnight of this morning.  She reports lower back pain.  She rates her pain 12/10.  Pain radiates down her right leg.  This is similar to her previous pain but worse.  Also reports some numbness but on the left side.  She denies bowel or bladder habit change.  She denies headache, chest pain or dyspnea.  Objective: Vitals:   08/19/20 2346 08/20/20 0217 08/20/20 0500 08/20/20 0941  BP: 139/84 132/73 (!) 146/80 (!) 141/69  Pulse: 71 62 67 72  Resp: 16 17 16 17   Temp: 97.7 F (36.5 C)  98.7 F (37.1 C) 98.1 F (36.7 C)  TempSrc: Oral  Oral Oral  SpO2: 98% 92% 100% 98%  Weight:      Height:        Intake/Output Summary  (Last 24 hours) at 08/20/2020 1107 Last data filed at 08/20/2020 1000 Gross per 24 hour  Intake 1965 ml  Output 0 ml  Net 1965 ml   Filed Weights   08/19/20 2341  Weight: 135.8 kg    Examination:  GENERAL: No apparent distress.  Nontoxic. HEENT: MMM.  Vision and hearing grossly intact.  NECK: Supple.  No apparent JVD.  RESP: On RA.  No IWOB.  Fair aeration bilaterally. CVS:  RRR. Heart sounds normal.  ABD/GI/GU: BS+. Abd soft, NTND.  MSK/EXT: Status post bilateral knee surgeries.  Deformity in right ankle from previous fracture.  Bilateral lower extremity weakness.  RLE appears to be shorter than LLE.  Diffuse tenderness across the lower back. SKIN: no apparent skin lesion or wound NEURO: Awake, alert and oriented appropriately.  No apparent focal neuro deficit other than BLE weakness, 3/5 bilaterally. PSYCH: Calm. Normal affect.   Procedures:  None  Microbiology summarized: HYIFO-27 and influenza PCR nonreactive.  Assessment & Plan: Accidental fall at home: No head trauma or LOC.  No prodrome leading to this. Acute on chronic lower back pain-likely due to the above.  Patient with known progressive multilevel lumbar DDD with multilevel moderate to severe neuroforaminal stenosis.  She is followed at the pain clinic.  She is on tramadol and Lyrica at home.  Now reports 12/10  pain in her back.  No acute finding on imaging this admission.  She has lower extremity weakness but symmetric.  No bowel or bladder habit change. -Continue as needed IV Dilaudid for severe pain -Add p.o. oxycodone as needed for moderate pain -Scheduled Tylenol around-the-clock -Continue home tramadol and Lyrica. -PT/OT eval  AKI/azotemia: Could be due to lisinopril/HCTZ.  CK within normal.  Improving. Recent Labs    08/23/19 0959 03/26/20 1415 08/20/20 0210 08/20/20 0410  BUN 15 18 30* 30*  CREATININE 1.27* 1.17 2.23* 2.07*  -Continue IV fluid -Continue holding lisinopril/HCTZ. -Further work-up if  worse.  Essential hypertension: Normotensive. -Continue holding home lisinopril/HCTZ   History of "mild CAD". -Continue home statin and aspirin.  Diffuse osteoarthritis -Pain control as above.   History of COPD -Continue home inhalers  History of diabetes?  Does not seem to be on medication at home.  BMP glucose within fair range. -Check hemoglobin A1c in the morning  Normocytic anemia: H&H stable. -Check anemia panel in the morning  Morbid obesity Body mass index is 60.47 kg/m.  -Encourage lifestyle change to lose weight.       DVT prophylaxis:  heparin injection 5,000 Units Start: 08/20/20 0600  Code Status: Full code Family Communication: Patient and/or RN. Available if any question.  Level of care: Med-Surg Status is: Observation  The patient will require care spanning > 2 midnights and should be moved to inpatient because: Ongoing active pain requiring inpatient pain management, Unsafe d/c plan, IV treatments appropriate due to intensity of illness or inability to take PO, and Inpatient level of care appropriate due to severity of illness  Dispo: The patient is from: Home              Anticipated d/c is to:  To be determined after therapy evaluation              Patient currently is not medically stable to d/c.   Difficult to place patient No       Consultants:  None   Sch Meds:  Scheduled Meds:  aspirin EC  81 mg Oral Daily   atorvastatin  10 mg Oral q1800   heparin  5,000 Units Subcutaneous Q8H   mometasone-formoterol  2 puff Inhalation BID   pregabalin  150 mg Oral BID   traMADol  50 mg Oral TID   Continuous Infusions:  lactated ringers 125 mL/hr at 08/20/20 0415   PRN Meds:.acetaminophen **OR** acetaminophen, albuterol, HYDROmorphone (DILAUDID) injection, ondansetron **OR** ondansetron (ZOFRAN) IV  Antimicrobials: Anti-infectives (From admission, onward)    None        I have personally reviewed the following labs and  images: CBC: Recent Labs  Lab 08/20/20 0210 08/20/20 0410  WBC 8.2 7.9  HGB 11.3* 11.2*  HCT 36.5 35.5*  MCV 93.8 89.2  PLT 165 192   BMP &GFR Recent Labs  Lab 08/20/20 0210 08/20/20 0410  NA 139 140  K 4.2 4.2  CL 106 108  CO2 22 22  GLUCOSE 100* 115*  BUN 30* 30*  CREATININE 2.23* 2.07*  CALCIUM 8.4* 8.6*   Estimated Creatinine Clearance: 30.6 mL/min (A) (by C-G formula based on SCr of 2.07 mg/dL (H)). Liver & Pancreas: No results for input(s): AST, ALT, ALKPHOS, BILITOT, PROT, ALBUMIN in the last 168 hours. No results for input(s): LIPASE, AMYLASE in the last 168 hours. No results for input(s): AMMONIA in the last 168 hours. Diabetic: No results for input(s): HGBA1C in the last 72 hours. No results for  input(s): GLUCAP in the last 168 hours. Cardiac Enzymes: Recent Labs  Lab 08/20/20 0452  CKTOTAL 223   No results for input(s): PROBNP in the last 8760 hours. Coagulation Profile: No results for input(s): INR, PROTIME in the last 168 hours. Thyroid Function Tests: No results for input(s): TSH, T4TOTAL, FREET4, T3FREE, THYROIDAB in the last 72 hours. Lipid Profile: No results for input(s): CHOL, HDL, LDLCALC, TRIG, CHOLHDL, LDLDIRECT in the last 72 hours. Anemia Panel: No results for input(s): VITAMINB12, FOLATE, FERRITIN, TIBC, IRON, RETICCTPCT in the last 72 hours. Urine analysis:    Component Value Date/Time   COLORURINE YELLOW 04/09/2018 1549   APPEARANCEUR CLEAR 04/09/2018 1549   LABSPEC 1.018 04/09/2018 1549   PHURINE 6.5 04/09/2018 1549   GLUCOSEU NEGATIVE 04/09/2018 1549   HGBUR NEGATIVE 04/09/2018 1549   BILIRUBINUR neg 09/04/2016 1107   KETONESUR NEGATIVE 04/09/2018 1549   PROTEINUR NEGATIVE 04/09/2018 1549   UROBILINOGEN 0.2 09/04/2016 1107   UROBILINOGEN 0.2 05/05/2013 1322   NITRITE neg 09/04/2016 1107   NITRITE NEGATIVE 03/30/2016 1245   LEUKOCYTESUR Small (1+) (A) 09/04/2016 1107   Sepsis Labs: Invalid input(s): PROCALCITONIN,  [place]  Microbiology: Recent Results (from the past 240 hour(s))  Resp Panel by RT-PCR (Flu A&B, Covid) Nasopharyngeal Swab     Status: None   Collection Time: 08/20/20  3:16 AM   Specimen: Nasopharyngeal Swab; Nasopharyngeal(NP) swabs in vial transport medium  Result Value Ref Range Status   SARS Coronavirus 2 by RT PCR NEGATIVE NEGATIVE Final    Comment: (NOTE) SARS-CoV-2 target nucleic acids are NOT DETECTED.  The SARS-CoV-2 RNA is generally detectable in upper respiratory specimens during the acute phase of infection. The lowest concentration of SARS-CoV-2 viral copies this assay can detect is 138 copies/mL. A negative result does not preclude SARS-Cov-2 infection and should not be used as the sole basis for treatment or other patient management decisions. A negative result may occur with  improper specimen collection/handling, submission of specimen other than nasopharyngeal swab, presence of viral mutation(s) within the areas targeted by this assay, and inadequate number of viral copies(<138 copies/mL). A negative result must be combined with clinical observations, patient history, and epidemiological information. The expected result is Negative.  Fact Sheet for Patients:  EntrepreneurPulse.com.au  Fact Sheet for Healthcare Providers:  IncredibleEmployment.be  This test is no t yet approved or cleared by the [place] FDA and  has been authorized for detection and/or diagnosis of SARS-CoV-2 by FDA under an Emergency Use Authorization (EUA). This EUA will remain  in effect (meaning this test can be used) for the duration of the COVID-19 declaration under Section 564(b)(1) of the Act, 21 U.S.C.section 360bbb-3(b)(1), unless the authorization is terminated  or revoked sooner.       Influenza A by PCR NEGATIVE NEGATIVE Final   Influenza B by PCR NEGATIVE NEGATIVE Final    Comment: (NOTE) The Xpert Xpress SARS-CoV-2/FLU/RSV plus  assay is intended as an aid in the diagnosis of influenza from Nasopharyngeal swab specimens and should not be used as a sole basis for treatment. Nasal washings and aspirates are unacceptable for Xpert Xpress SARS-CoV-2/FLU/RSV testing.  Fact Sheet for Patients: EntrepreneurPulse.com.au  Fact Sheet for Healthcare Providers: IncredibleEmployment.be  This test is not yet approved or cleared by the [place] FDA and has been authorized for detection and/or diagnosis of SARS-CoV-2 by FDA under an Emergency Use Authorization (EUA). This EUA will remain in effect (meaning this test can be used) for the duration of the COVID-19 declaration  under Section 564(b)(1) of the Act, 21 U.S.C. section 360bbb-3(b)(1), unless the authorization is terminated or revoked.  Performed at [facility], [place] [address], [place], [place] [address]     Radiology Studies: DG Chest 1 View  Result Date: 08/20/2020 CLINICAL DATA:  Status post fall. EXAM: CHEST  1 VIEW COMPARISON:  Chest x-ray 05/23/2020 FINDINGS: The heart size and mediastinal contours are unchanged. No focal consolidation. No pulmonary edema. No pleural effusion. No pneumothorax. No acute osseous abnormality. Redemonstration of severe bilateral shoulder degenerative changes. IMPRESSION: No active disease. Electronically Signed   By: Iven Finn M.D.   On: 08/20/2020 02:23   DG Pelvis 1-2 Views  Result Date: 08/20/2020 CLINICAL DATA:  Fall. EXAM: PELVIS - 1-2 VIEW COMPARISON:  None. FINDINGS: Previous left hip arthroplasty. Moderate degenerative changes are noted in right hip. No signs of acute fracture or dislocation. Note: The left iliac wing is excluded from the field of view. Degenerative changes are noted involving the left SI joint. IMPRESSION: 1. No acute findings. 2. Moderate right hip osteoarthritis. 3. Left iliac wing excluded from the field of view. Electronically Signed   By:  Kerby Moors M.D.   On: 08/20/2020 02:17   DG Shoulder Right  Result Date: 08/20/2020 CLINICAL DATA:  Fall EXAM: RIGHT SHOULDER - 2+ VIEW COMPARISON:  09/05/2015 FINDINGS: Advanced degenerative change of the humeral head and acromioclavicular joint is unchanged. There is no acute fracture or dislocation. IMPRESSION: No acute fracture or dislocation of the right shoulder. Electronically Signed   By: Ulyses Jarred M.D.   On: 08/20/2020 02:09   DG Ankle Complete Left  Result Date: 08/20/2020 CLINICAL DATA:  Status post fall. EXAM: LEFT ANKLE COMPLETE - 3+ VIEW COMPARISON:  None. FINDINGS: No evidence of fracture, dislocation, or joint effusion. Degenerative changes of the bones of the left ankle and foot. Hallux valgus noted. Plantar calcaneal spur. No aggressive appearing focal bone abnormality. Anterior left ankle subcutaneus soft tissue edema. Vascular calcifications. IMPRESSION: No acute displaced fracture or dislocation. Electronically Signed   By: Iven Finn M.D.   On: 08/20/2020 02:05   DG Ankle Complete Right  Result Date: 08/20/2020 CLINICAL DATA:  Status post fall. EXAM: RIGHT ANKLE - COMPLETE 3+ VIEW COMPARISON:  04/23/2016 FINDINGS: No signs of acute fracture or dislocation. Marked, chronic arthro pathic changes involving the right ankle identified with chronic collapse of the talus with increased sclerosis and bony hypertrophy centered around the tibiotalar joint. Progressive increased sclerosis involving the navicular bone is identified which appears partially collapsed with surrounding secondary degenerative changes. Remote fracture deformity involving the distal tibia is identified which appears unchanged from previous exam. Medial soft tissue swelling is identified. Vascular calcifications noted. IMPRESSION: 1. No acute findings. 2. Marked, chronic arthropathic changes involving the right ankle. Findings concerning for neuropathic arthropathy. 3. Progressive sclerosis involving the  navicular bone which appears partially collapsed with surrounding secondary degenerative changes. 4. Medial soft tissue swelling. Electronically Signed   By: Kerby Moors M.D.   On: 08/20/2020 02:14   CT HEAD WO CONTRAST  Result Date: 08/20/2020 CLINICAL DATA:  Status post fall EXAM: CT HEAD WITHOUT CONTRAST CT CERVICAL SPINE WITHOUT CONTRAST TECHNIQUE: Multidetector CT imaging of the head and cervical spine was performed following the standard protocol without intravenous contrast. Multiplanar CT image reconstructions of the cervical spine were also generated. COMPARISON:  CT head 11/17/2008, MR head 03/30/2016, CT head and C-spine 03/30/2016 FINDINGS: CT HEAD FINDINGS Brain: Patchy and confluent areas of decreased  attenuation are noted throughout the deep and periventricular white matter of the cerebral hemispheres bilaterally, compatible with chronic microvascular ischemic disease. No evidence of large-territorial acute infarction. No parenchymal hemorrhage. No mass lesion. No extra-axial collection. No mass effect or midline shift. No hydrocephalus. Basilar cisterns are patent. Vascular: No hyperdense vessel. Skull: Hyperostosis frontalis interna. No acute fracture or focal lesion. Sinuses/Orbits: Chronic fluid noted within the bilateral mastoid air cells. Paranasal sinuses cells are clear. No fluid noted within the middle ears bilaterally. The orbits are unremarkable. Other: None. CT CERVICAL SPINE FINDINGS Alignment: Normal. Skull base and vertebrae: Markedly limited evaluation of cervical spine starting at the C3 level due to quantum mottle artifact. Multilevel degenerative changes of the spine. Multilevel degenerative changes of the spine with associated at least moderate multilevel osseous neural foraminal stenosis. No definite findings of severe osseous central canal stenosis with limited evaluation. No acute fracture. No aggressive appearing focal osseous lesion or focal pathologic process. Soft  tissues and spinal canal: No prevertebral fluid or swelling. No visible canal hematoma. Upper chest: Unremarkable. Other: None. IMPRESSION: 1. No acute intracranial abnormality. 2. No acute displaced fracture or traumatic listhesis of the cervical spine at the C1 and C2 level. Markedly limited evaluation of cervical spine starting at the C3 level due to quantum mottle artifact. Electronically Signed   By: Iven Finn M.D.   On: 08/20/2020 02:12   CT CERVICAL SPINE WO CONTRAST  Result Date: 08/20/2020 CLINICAL DATA:  Status post fall EXAM: CT HEAD WITHOUT CONTRAST CT CERVICAL SPINE WITHOUT CONTRAST TECHNIQUE: Multidetector CT imaging of the head and cervical spine was performed following the standard protocol without intravenous contrast. Multiplanar CT image reconstructions of the cervical spine were also generated. COMPARISON:  CT head 11/17/2008, MR head 03/30/2016, CT head and C-spine 03/30/2016 FINDINGS: CT HEAD FINDINGS Brain: Patchy and confluent areas of decreased attenuation are noted throughout the deep and periventricular white matter of the cerebral hemispheres bilaterally, compatible with chronic microvascular ischemic disease. No evidence of large-territorial acute infarction. No parenchymal hemorrhage. No mass lesion. No extra-axial collection. No mass effect or midline shift. No hydrocephalus. Basilar cisterns are patent. Vascular: No hyperdense vessel. Skull: Hyperostosis frontalis interna. No acute fracture or focal lesion. Sinuses/Orbits: Chronic fluid noted within the bilateral mastoid air cells. Paranasal sinuses cells are clear. No fluid noted within the middle ears bilaterally. The orbits are unremarkable. Other: None. CT CERVICAL SPINE FINDINGS Alignment: Normal. Skull base and vertebrae: Markedly limited evaluation of cervical spine starting at the C3 level due to quantum mottle artifact. Multilevel degenerative changes of the spine. Multilevel degenerative changes of the spine with  associated at least moderate multilevel osseous neural foraminal stenosis. No definite findings of severe osseous central canal stenosis with limited evaluation. No acute fracture. No aggressive appearing focal osseous lesion or focal pathologic process. Soft tissues and spinal canal: No prevertebral fluid or swelling. No visible canal hematoma. Upper chest: Unremarkable. Other: None. IMPRESSION: 1. No acute intracranial abnormality. 2. No acute displaced fracture or traumatic listhesis of the cervical spine at the C1 and C2 level. Markedly limited evaluation of cervical spine starting at the C3 level due to quantum mottle artifact. Electronically Signed   By: Iven Finn M.D.   On: 08/20/2020 02:12   CT Lumbar Spine Wo Contrast  Result Date: 08/20/2020 CLINICAL DATA:  Status post fall.  Low back pain. EXAM: CT LUMBAR SPINE WITHOUT CONTRAST TECHNIQUE: Multidetector CT imaging of the lumbar spine was performed without intravenous contrast administration. Multiplanar CT  image reconstructions were also generated. COMPARISON:  MRI lumbar spine 08/12/2019 FINDINGS: Segmentation: 5 lumbar type vertebrae. Alignment: Normal. Vertebrae: Limited evaluation due to quantum mottle artifact. Severe multilevel degenerative changes of the spine including intervertebral disc space vacuum phenomenon, facet arthropathy, osteophyte formation, posterior disc osteophyte formation, fusion of the L1-L2 vertebral bodies. Associated multilevel moderate to severe osseous neural foraminal stenosis as well as multilevel severe osseous central canal stenosis. No definite acute displaced fracture or focal pathologic process. Paraspinal and other soft tissues: Negative. Other: Vacuum phenomenon of the sacroiliac joints. No ankylosis. Visualize portions of the sacral spine grossly unremarkable. Atherosclerotic plaque of the aorta. Visualized portion of the retroperitoneum unremarkable. IMPRESSION: 1. No acute displaced fracture or traumatic  listhesis of the lumbar spine with limited evaluation due to quantum mottle artifact. 2. Severe multilevel degenerative changes of the spine with redemonstration of moderate to severe multilevel osseous neural foraminal stenosis and multilevel severe osseous spinal canal stenosis. 3. Aortic Atherosclerosis (ICD10-I70.0). Electronically Signed   By: Iven Finn M.D.   On: 08/20/2020 02:21   DG Shoulder Left  Result Date: 08/20/2020 CLINICAL DATA:  Status post fall. EXAM: LEFT SHOULDER - 2+ VIEW COMPARISON:  05/23/2020 FINDINGS: No dislocation identified. No fracture. Marked narrowing of the glenohumeral joint is identified with increased sclerosis of the humeral head and bony hypertrophy along the margins of the glenoid. The joint space appears indistinct. Cannot exclude underlying inflammatory arthropathy. IMPRESSION: 1. No fracture or dislocation. 2. Marked narrowing of the glenohumeral joint with bony overgrowth. The joint space is indistinct. Cannot exclude inflammatory arthropathy. Electronically Signed   By: Kerby Moors M.D.   On: 08/20/2020 02:09   DG Knee Complete 4 Views Left  Result Date: 08/20/2020 CLINICAL DATA:  Fall. EXAM: LEFT KNEE - COMPLETE 4+ VIEW COMPARISON:  None FINDINGS: Previous left total knee arthroplasty. No acute fracture or dislocation identified. No joint effusion identified. Several loose bodies identified within the posterior joint space. IMPRESSION: 1. No acute findings. 2. Loose bodies within the posterior joint space. Electronically Signed   By: Kerby Moors M.D.   On: 08/20/2020 02:15       Jatavian Calica T. Sadorus  If 7PM-7AM, please contact night-coverage [email or web address] 08/20/2020, 11:07 AM

## 2020-08-20 NOTE — Evaluation (Signed)
Physical Therapy Evaluation Patient Details Name: Sandra Brennan MRN: [id number] DOB: 22-Apr-1945 Today's Date: 08/20/2020   History of Present Illness  75 y.o. female with medical history significant for asthma, HTN, DM, MI, migraines, OA, anemia who presents after having a fall at home.  She normally ambulates with the aid of a walker.  She was trying to fix a curtain rod and was standing on a dresser to reach the rod.  The dresser fell when she lost her balance.  She fell down on the ground and the dresser fell on top of her. xrays and CT scans all negative for fxs  Clinical Impression  Pt admitted with above diagnosis.  Pt independent/mod I at baseline, lives in handicapped accessible apt with her 19 yo mother. Pt reports her dtr will be able to assist as needed. Pt declines SNF therefore recommend HHPT, hopefully she will progress well once pain more controlled.   Pt currently with functional limitations due to the deficits listed below (see PT Problem List). Pt will benefit from skilled PT to increase their independence and safety with mobility to allow discharge to the venue listed below.       Follow Up Recommendations Home health PT;Supervision for mobility/OOB (declines SNF)    Equipment Recommendations  None recommended by PT    Recommendations for Other Services       Precautions / Restrictions Precautions Precautions: Fall Restrictions Weight Bearing Restrictions: No      Mobility  Bed Mobility Overal bed mobility: Needs Assistance Bed Mobility: Supine to Sit     Supine to sit: Mod assist;+2 for safety/equipment;+2 for physical assistance     General bed mobility comments: incr time, assist with LEs off bed and to elevate trunk, cues to self assist    Transfers Overall transfer level: Needs assistance Equipment used: Rolling walker (2 wheeled) Transfers: Sit to/from Stand Sit to Stand: Min assist;+2 physical assistance;+2 safety/equipment         General  transfer comment: cues for hand placement, assist to rise and complete transition to RW  Ambulation/Gait Ambulation/Gait assistance: Min assist Gait Distance (Feet): 5 Feet Assistive device: Rolling walker (2 wheeled) Gait Pattern/deviations: Step-to pattern;Decreased step length - right;Decreased step length - left;Wide base of support     General Gait Details: incr time, assist with balance while wt shifting, stability improved with distance  Stairs            Wheelchair Mobility    Modified Rea (Stroke Patients Only)       Balance Overall balance assessment: Needs assistance Sitting-balance support: Feet supported;No upper extremity supported Sitting balance-Leahy Scale: Fair       Standing balance-Leahy Scale: Poor Standing balance comment: reliant on UEs                             Pertinent Vitals/Pain Pain Assessment: Faces    Home Living Family/patient expects to be discharged to:: Private residence Living Arrangements: Parent   Type of Home: Apartment Home Access: Level entry     Home Layout: One level Home Equipment: Walker - 4 wheels;Bedside commode;Grab bars - toilet;Grab bars - tub/shower      Prior Function Level of Independence: Independent with assistive device(s)         Comments: rollator on "bad days"     Hand Dominance        Extremity/Trunk Assessment   Upper Extremity Assessment Upper Extremity Assessment: Generalized weakness (grossly  WFL for AROM, c/o stiffness with ROM)    Lower Extremity Assessment Lower Extremity Assessment: RLE deficits/detail;LLE deficits/detail RLE Deficits / Details: knee flexion to ~ 50 degrees, strength grossly 2+ to 3/5 LLE Deficits / Details: knee flexion to ~ 50 degrees, strength grossly 2+ to 3/5       Communication   Communication: No difficulties  Cognition Arousal/Alertness: Awake/alert Behavior During Therapy: WFL for tasks assessed/performed Overall Cognitive  Status: Within Functional Limits for tasks assessed                                        General Comments      Exercises     Assessment/Plan    PT Assessment Patient needs continued PT services  PT Problem List Decreased strength;Decreased activity tolerance;Decreased mobility;Decreased balance;Pain;Decreased knowledge of use of DME;Decreased range of motion;Obesity       PT Treatment Interventions DME instruction;Therapeutic exercise;Gait training;Functional mobility training;Therapeutic activities;Patient/family education;Balance training    PT Goals (Current goals can be found in the Care Plan section)  Acute Rehab PT Goals Patient Stated Goal: to go home PT Goal Formulation: With patient Time For Goal Achievement: 09/03/20 Potential to Achieve Goals: Good    Frequency Min 3X/week   Barriers to discharge        Co-evaluation               AM-PAC PT "6 Clicks" Mobility  Outcome Measure Help needed turning from your back to your side while in a flat bed without using bedrails?: A Little Help needed moving from lying on your back to sitting on the side of a flat bed without using bedrails?: A Little Help needed moving to and from a bed to a chair (including a wheelchair)?: A Little Help needed standing up from a chair using your arms (e.g., wheelchair or bedside chair)?: A Little Help needed to walk in hospital room?: A Little Help needed climbing 3-5 steps with a railing? : A Lot 6 Click Score: 17    End of Session Equipment Utilized During Treatment: Gait belt Activity Tolerance: Patient tolerated treatment well Patient left: with call bell/phone within reach;in chair;with chair alarm set;with family/visitor present Nurse Communication: Mobility status PT Visit Diagnosis: Other abnormalities of gait and mobility (R26.89);Muscle weakness (generalized) (M62.81);History of falling (Z91.81)    Time: 7253-6644 PT Time Calculation (min) (ACUTE  ONLY): 23 min   Charges:   PT Evaluation $PT Eval Low Complexity: 1 Low PT Treatments $Therapeutic Activity: 8-22 mins        Baxter Flattery, PT  Acute Rehab Dept ([facility]) [phone] Pager [phone]  08/20/2020   [facility] 08/20/2020, 1:19 PM

## 2020-08-21 DIAGNOSIS — N179 Acute kidney failure, unspecified: Secondary | ICD-10-CM | POA: Diagnosis not present

## 2020-08-21 LAB — FOLATE: Folate: 41.4 ng/mL (ref >5.9)

## 2020-08-21 LAB — IRON AND TIBC
Iron: 39 ug/dL (ref 28–170)
Saturation Ratios: 13 % (ref 10.4–31.8)
TIBC: 296 ug/dL (ref 250–450)
UIBC: 257 ug/dL

## 2020-08-21 LAB — RENAL FUNCTION PANEL
Albumin: 3.2 g/dL — ABNORMAL LOW (ref 3.5–5.0)
Anion gap: 6 (ref 5–15)
BUN: 27 mg/dL — ABNORMAL HIGH (ref 8–23)
CO2: 27 mmol/L (ref 22–32)
Calcium: 8.5 mg/dL — ABNORMAL LOW (ref 8.9–10.3)
Chloride: 104 mmol/L (ref 98–111)
Creatinine, Ser: 1.37 mg/dL — ABNORMAL HIGH (ref 0.44–1.00)
GFR, Estimated: 41 mL/min — ABNORMAL LOW (ref >60)
Glucose, Bld: 105 mg/dL — ABNORMAL HIGH (ref 70–99)
Phosphorus: 3.8 mg/dL (ref 2.5–4.6)
Potassium: 4.2 mmol/L (ref 3.5–5.1)
Sodium: 137 mmol/L (ref 135–145)

## 2020-08-21 LAB — FERRITIN: Ferritin: 34 ng/mL (ref 11–307)

## 2020-08-21 LAB — CBC
HCT: 31.7 % — ABNORMAL LOW (ref 36.0–46.0)
Hemoglobin: 9.8 g/dL — ABNORMAL LOW (ref 12.0–15.0)
MCH: 28.4 pg (ref 26.0–34.0)
MCHC: 30.9 g/dL (ref 30.0–36.0)
MCV: 91.9 fL (ref 80.0–100.0)
Platelets: 201 10*3/uL (ref 150–400)
RBC: 3.45 MIL/uL — ABNORMAL LOW (ref 3.87–5.11)
RDW: 14.3 % (ref 11.5–15.5)
WBC: 5.7 10*3/uL (ref 4.0–10.5)
nRBC: 0 % (ref 0.0–0.2)

## 2020-08-21 LAB — VITAMIN B12: Vitamin B-12: 877 pg/mL (ref 180–914)

## 2020-08-21 LAB — RETICULOCYTES
Immature Retic Fract: 14.9 % (ref 2.3–15.9)
RBC.: 3.52 MIL/uL — ABNORMAL LOW (ref 3.87–5.11)
Retic Count, Absolute: 42.2 10*3/uL (ref 19.0–186.0)
Retic Ct Pct: 1.2 % (ref 0.4–3.1)

## 2020-08-21 LAB — MAGNESIUM: Magnesium: 1.8 mg/dL (ref 1.7–2.4)

## 2020-08-21 NOTE — Progress Notes (Addendum)
Physical Therapy Treatment Patient Details Name: Sandra Brennan MRN: [id number] DOB: [birth year] Today's Date: 08/21/2020    History of Present Illness 75 y.o. female with medical history significant for asthma, HTN, DM, MI, migraines, OA, anemia who presents after having a fall at home.  She normally ambulates with the aid of a walker.  She was trying to fix a curtain rod and was standing on a dresser to reach the rod.  The dresser fell when she lost her balance.  She fell down on the ground and the dresser fell on top of her. xrays and CT scans all negative for fxs    PT Comments    Incr hip pain today limiting activity, unable to stand in full extension, unable to amb d/t pain, bed mobility improved overall/decr assist required. Pt is motivated to mobilize despite pain. Discussed pt pain level with RN.   Pt will benefit from SNF to maximize independence and safety.    Follow Up Recommendations  SNF      Equipment Recommendations  None recommended by PT    Recommendations for Other Services       Precautions / Restrictions Precautions Precautions: Fall Restrictions Weight Bearing Restrictions: No    Mobility  Bed Mobility Overal bed mobility: Needs Assistance Bed Mobility: Supine to Sit     Supine to sit: Min guard;HOB elevated     General bed mobility comments: min/guard to elevate trunk, use of rail    Transfers Overall transfer level: Needs assistance Equipment used: Rolling walker (2 wheeled) Transfers: Sit to/from [facility] Sit to Stand: Min assist;Mod assist Stand pivot transfers: Min assist;Mod assist       General transfer comment: cues for hand placement, pt prefers to pull up on RW, pt unable to extend trunk and hips d/t severe right hip pain; pt able to transfer to chair in fully flexed position (hips flexed to 90*)  Ambulation/Gait                 Stairs             Wheelchair Mobility    Modified [place] (Stroke  Patients Only)       Balance             Standing balance-Leahy Scale: Poor Standing balance comment: reliant on UEs                            Cognition Arousal/Alertness: Awake/alert Behavior During Therapy: WFL for tasks assessed/performed Overall Cognitive Status: Within Functional Limits for tasks assessed                                        Exercises      General Comments        Pertinent Vitals/Pain Pain Assessment: Faces Faces Pain Scale: Hurts worst Pain Location: right hip Pain Descriptors / Indicators: Constant;Grimacing;Guarding Pain Intervention(s): Limited activity within patient's tolerance;Monitored during session;Premedicated before session;Repositioned;Heat applied;Patient requesting pain meds-RN notified    Home Living                      Prior Function            PT Goals (current goals can now be found in the care plan section) Acute Rehab PT Goals Patient Stated Goal: get better PT Goal Formulation: With patient  Time For Goal Achievement: 09/03/20 Potential to Achieve Goals: Good Progress towards PT goals: Progressing toward goals    Frequency    Min 3X/week      PT Plan Current plan remains appropriate    Co-evaluation              AM-PAC PT "6 Clicks" Mobility   Outcome Measure  Help needed turning from your back to your side while in a flat bed without using bedrails?: A Little Help needed moving from lying on your back to sitting on the side of a flat bed without using bedrails?: A Little Help needed moving to and from a bed to a chair (including a wheelchair)?: A Little Help needed standing up from a chair using your arms (e.g., wheelchair or bedside chair)?: A Little Help needed to walk in hospital room?: A Little Help needed climbing 3-5 steps with a railing? : A Lot 6 Click Score: 17    End of Session Equipment Utilized During Treatment: Gait belt Activity Tolerance:  Patient tolerated treatment well Patient left: in chair;with call bell/phone within reach;with chair alarm set Nurse Communication: Mobility status PT Visit Diagnosis: Other abnormalities of gait and mobility (R26.89);Muscle weakness (generalized) (M62.81);History of falling (Z91.81)     Time: 9249-3241 PT Time Calculation (min) (ACUTE ONLY): 16 min  Charges:  $Therapeutic Activity: 8-22 mins                     Baxter Flattery, PT  Acute Rehab Dept ([place]) [phone] Pager [phone]  08/21/2020    [facility] 08/21/2020, 11:17 AM

## 2020-08-21 NOTE — Progress Notes (Signed)
Occupational Therapy Treatment Patient Details Name: Sandra Brennan MRN: [id number] DOB: [birth year] Today's Date: 08/21/2020    History of present illness 75 y.o. female with medical history significant for asthma, HTN, DM, MI, migraines, OA, anemia who presents after having a fall at home.  She normally ambulates with the aid of a walker.  She was trying to fix a curtain rod and was standing on a dresser to reach the rod.  The dresser fell when she lost her balance.  She fell down on the ground and the dresser fell on top of her. xrays and CT scans all negative for fxs   OT comments  Patient able to ambulate to bathroom today and perform toileting. However patient has increased pain and her gait speed slow. She is at high risk for falls and continues to require a lot of assistance for ADLs. 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 Would recommend short term rehab at discharge at this time..    Follow Up Recommendations  SNF    Equipment Recommendations  None recommended by OT    Recommendations for Other Services      Precautions / Restrictions Precautions Precautions: Fall Precaution Comments: hx of RLE buckling Restrictions Weight Bearing Restrictions: No       Mobility Bed Mobility Overal bed mobility: Needs Assistance Bed Mobility: Sit to Supine     Supine to sit: Min guard;HOB elevated Sit to supine: Mod assist   General bed mobility comments: mod assist for LEs to transfer back into bed.    Transfers Overall transfer level: Needs assistance Equipment used: Rolling walker (2 wheeled) Transfers: Sit to/from [facility] Sit to Stand: Min guard Stand pivot transfers: Min guard       General transfer comment: MIn guard with RW to stand and ambulate to bathroom. Patient ambulated slowly due to pain. ABle to perform toilet transfer with grab bar.    Balance   Sitting-balance support:  Feet supported;No upper extremity supported Sitting balance-Leahy Scale: Fair       Standing balance-Leahy Scale: Poor Standing balance comment: reliant on UEs                           ADL either performed or assessed with clinical judgement   ADL Overall ADL's : Needs assistance/impaired                         Toilet Transfer: Min guard;RW;Regular Toilet;Grab bars Toilet Transfer Details (indicate cue type and reason): ambulated slowly to bathroom with RW. Used grab to transfer onto toiletin wtih RW. Toileting- Water quality scientist and Hygiene: Min guard;Sit to/from stand;Sitting/lateral lean Toileting - Clothing Manipulation Details (indicate cue type and reason): Patient able to toilet in seated position to clean self.             Vision Patient Visual Report: No change from baseline     Perception     Praxis      Cognition Arousal/Alertness: Awake/alert Behavior During Therapy: WFL for tasks assessed/performed Overall Cognitive Status: Within Functional Limits for tasks assessed                                          Exercises     Shoulder Instructions  General Comments      Pertinent Vitals/ Pain       Pain Assessment: Faces Faces Pain Scale: Hurts even more Pain Location: right hip Pain Descriptors / Indicators: Constant;Grimacing;Guarding Pain Intervention(s): Limited activity within patient's tolerance;Monitored during session;Premedicated before session;Repositioned  Home Living                                          Prior Functioning/Environment              Frequency  Min 2X/week        Progress Toward Goals  OT Goals(current goals can now be found in the care plan section)  Progress towards OT goals: Progressing toward goals  Acute Rehab OT Goals Patient Stated Goal: get better OT Goal Formulation: With patient Time For Goal Achievement: 09/03/20 Potential to  Achieve Goals: Good  Plan Discharge plan remains appropriate    Co-evaluation                 AM-PAC OT "6 Clicks" Daily Activity     Outcome Measure   Help from another person eating meals?: None Help from another person taking care of personal grooming?: A Little Help from another person toileting, which includes using toliet, bedpan, or urinal?: A Little Help from another person bathing (including washing, rinsing, drying)?: A Lot Help from another person to put on and taking off regular upper body clothing?: A Lot Help from another person to put on and taking off regular lower body clothing?: A Lot 6 Click Score: 16    End of Session Equipment Utilized During Treatment: Rolling walker  OT Visit Diagnosis: Other abnormalities of gait and mobility (R26.89);Pain Pain - Right/Left: Right Pain - part of body: Hip   Activity Tolerance Patient limited by pain   Patient Left in bed;with call bell/phone within reach   Nurse Communication Mobility status        Time: 5956-3875 OT Time Calculation (min): 13 min  Charges: OT General Charges $OT Visit: 1 Visit OT Treatments $Self Care/Home Management : 8-22 mins  Derl Barrow, OTR/L [place]  Office [phone] Pager: [place] 08/21/2020, 1:51 PM

## 2020-08-21 NOTE — NC FL2 (Signed)
[place] LEVEL OF CARE SCREENING TOOL     IDENTIFICATION  Patient Name: Sandra Brennan Birthdate: Jan 08, 1946 Sex: female Admission Date (Current Location): 08/19/2020  [place] and [place] Number:  Kathleen Argue [id number] [place] and Address:  [facility],  [place], [place]      Provider Number: [id number]  Attending Physician Name and Address:  Gwynne Edinger, MD  Relative Name and Phone Number:  Eduard Clos (daughter) Ph: [phone]    Current Level of Care: Hospital Recommended Level of Care: [place] Prior Approval Number:    Date Approved/Denied:   PASRR Number: [id number]  Discharge Plan: SNF    Current Diagnoses: Patient Active Problem List   Diagnosis Date Noted   AKI (acute kidney injury) ([place]) 08/20/2020   Morbid obesity with BMI of 60.0-69.9, adult ([place]) 08/20/2020   Knee pain 08/20/2020   Intertrigo 03/27/2020   Anemia 08/23/2019   Polyethylene liner wear following total hip arthroplasty requiring isolated polyethylene liner exchange ([place]) 07/12/2019   Depression, recurrent ([place]) 07/12/2018   Migraine without aura and without status migrainosus, not intractable 09/10/2017   Post-traumatic osteoarthritis of right ankle 06/09/2016   Spondylosis without myelopathy or radiculopathy, lumbar region 06/09/2016   Cervical spinal stenosis 06/09/2016   Spinal stenosis, lumbar region, without neurogenic claudication 06/09/2016   Chronic gouty arthritis 06/04/2016   Vitamin D deficiency 06/02/2016   Hyperlipidemia 06/02/2016   Closed right ankle fracture 05/01/2016   Colon polyps 05/01/2016   Fibromyalgia syndrome 05/01/2016   Syncope 03/30/2016   Chest pain 03/30/2016   Fall 03/30/2016   GERD with esophagitis 06/21/2015   Pharyngeal dysphagia 06/21/2015   Obesity 05/06/2013   Acute nontraumatic kidney injury ([place]) 05/05/2013   Acute posthemorrhagic anemia 09/06/2012   Gout 09/05/2012    Osteoarthritis of left hip 08/05/2012   Type 2 diabetes mellitus with diabetic polyneuropathy, without long-term current use of insulin ([place]) 08/05/2012   Hypertension 08/05/2012   Asthma, chronic 08/05/2012   Sleep apnea 08/05/2012    Orientation RESPIRATION BLADDER Height & Weight     Self, Time, Situation, Place  Normal Continent Weight: 299 lb 6.4 oz (135.8 kg) Height:  4\' 11"  (149.9 cm)  BEHAVIORAL SYMPTOMS/MOOD NEUROLOGICAL BOWEL NUTRITION STATUS      Continent Diet (Heart healthy/carb modified)  AMBULATORY STATUS COMMUNICATION OF NEEDS Skin   Limited Assist Verbally Normal                       Personal Care Assistance Level of Assistance  Bathing, Feeding, Dressing Bathing Assistance: Limited assistance Feeding assistance: Independent Dressing Assistance: Limited assistance     Functional Limitations Info  Sight, Hearing, Speech Sight Info: Impaired Hearing Info: Adequate Speech Info: Adequate    SPECIAL CARE FACTORS FREQUENCY  PT (By licensed PT), OT (By licensed OT)     PT Frequency: 5x's/week OT Frequency: 5x's/week            Contractures Contractures Info: Not present    Additional Factors Info  Code Status, Allergies Code Status Info: Full Allergies Info: Cymbalta (Duloxetine Hcl), Pineapple, Sulfa Antibiotics, Influenza Vaccines, Penicillins, Theophyllines           Current Medications (08/21/2020):  This is the current hospital active medication list Current Facility-Administered Medications  Medication Dose Route Frequency Provider Last Rate Last Admin   acetaminophen (TYLENOL) tablet 1,000 mg  1,000 mg Oral Q8H Wendee Beavers T, MD   1,000 mg at 08/21/20 [phone]  albuterol (VENTOLIN HFA) 108 (90 Base) MCG/ACT inhaler 1-2 puff  1-2 puff Inhalation Q6H PRN Chotiner, Yevonne Aline, MD       aspirin EC tablet 81 mg  81 mg Oral Daily Chotiner, Yevonne Aline, MD   81 mg at 08/21/20 0942   atorvastatin (LIPITOR) tablet 10 mg  10 mg Oral q1800 Chotiner,  Yevonne Aline, MD   10 mg at 08/20/20 1731   heparin injection 5,000 Units  5,000 Units Subcutaneous Q8H Chotiner, Yevonne Aline, MD   5,000 Units at 08/21/20 0529   HYDROmorphone (DILAUDID) injection 0.5 mg  0.5 mg Intravenous Q3H PRN Chotiner, Yevonne Aline, MD   0.5 mg at 08/20/20 1010   mometasone-formoterol (DULERA) 200-5 MCG/ACT inhaler 2 puff  2 puff Inhalation BID Chotiner, Yevonne Aline, MD   2 puff at 08/21/20 0736   ondansetron (ZOFRAN) tablet 4 mg  4 mg Oral Q6H PRN Chotiner, Yevonne Aline, MD       Or   ondansetron (ZOFRAN) injection 4 mg  4 mg Intravenous Q6H PRN Chotiner, Yevonne Aline, MD       oxyCODONE (Oxy IR/ROXICODONE) immediate release tablet 5 mg  5 mg Oral Q6H PRN Wendee Beavers T, MD   5 mg at 08/21/20 0205   polyethylene glycol (MIRALAX / GLYCOLAX) packet 17 g  17 g Oral BID PRN Mercy Riding, MD       pregabalin (LYRICA) capsule 150 mg  150 mg Oral BID Chotiner, Yevonne Aline, MD   150 mg at 08/21/20 0942   senna-docusate (Senokot-S) tablet 1 tablet  1 tablet Oral BID PRN Mercy Riding, MD       traMADol (ULTRAM) tablet 50 mg  50 mg Oral TID Chotiner, Yevonne Aline, MD   50 mg at 08/21/20 0941     Discharge Medications: Please see discharge summary for a list of discharge medications.  Relevant Imaging Results:  Relevant Lab Results:   Additional Information SSN: [id number]  Sherie Don, LCSW

## 2020-08-21 NOTE — TOC Initial Note (Addendum)
Transition of Care [facility]) - Initial/Assessment Note   Patient Details  Name: Sandra Brennan MRN: [id number] Date of Birth: Feb 28, 1946  Transition of [facility]) CM/SW Contact:    Sherie Don, LCSW Phone Number: 08/21/2020, 11:09 AM  Clinical Narrative: PT and OT evaluations recommended HHPT as patient declined SNF. CSW spoke with patient who is agreeable to a [place] referral to [facility] agencies in-network with her insurance. Referral made to Amy with [facility]. Per patient, she will also need a rolling walker as she has been using her mother's old walker. CSW made referral to [facility] with [facility] for DME. TOC to follow.  Addendum: Patient is now agreeable to SNF. FL2 done; PASRR verified. Initial referral faxed out. CSW called Bernadene Bell to start insurance authorization. Reference # is: [id number]. TOC awaiting bed offers.  Barriers to Discharge: Continued Medical Work up  Patient Goals and CMS Choice Patient states their goals for this hospitalization and ongoing recovery are:: Discharge home with [facility] CMS Medicare.gov Compare Post Acute Care list provided to:: Patient Choice offered to / list presented to : Patient  Expected Discharge Plan and Services In-house Referral: Clinical Social Work Post Acute Care Choice: Durable Medical Equipment, Home Health Living arrangements for the past 2 months: Apartment             DME Arranged: Walker rolling DME Agency: [employer] Date DME Agency Contacted: 08/21/20 Time DME Agency Contacted: [id number] Representative spoke with at DME Agency: Freda Munro HH Arranged: PT, OT [place] Agency: [place] Date [place]: 08/21/20 Time [place]: 66 Representative spoke with at [place]: [place] Arrangements/Services Living arrangements for the past 2 months: [place] with:: Self Patient language and need for interpreter reviewed:: Yes Do you feel safe going back to the place where you live?: Yes      Need for Family  Participation in Patient Care: No (Comment) Care giver support system in place?: Yes (comment) Criminal Activity/Legal Involvement Pertinent to Current Situation/Hospitalization: No - Comment as needed  Activities of Daily Living Home Assistive Devices/Equipment: Eyeglasses, Gilford Rile (specify type) ADL Screening (condition at time of admission) Patient's cognitive ability adequate to safely complete daily activities?: Yes Is the patient deaf or have difficulty hearing?: No Does the patient have difficulty seeing, even when wearing glasses/contacts?: No Does the patient have difficulty concentrating, remembering, or making decisions?: No Patient able to express need for assistance with ADLs?: Yes Does the patient have difficulty dressing or bathing?: No Independently performs ADLs?: Yes (appropriate for developmental age) Does the patient have difficulty walking or climbing stairs?: Yes Weakness of Legs: Both Weakness of Arms/Hands: None  Permission Sought/Granted Permission sought to share information with : Other (comment) Permission granted to share information with : Yes, Verbal Permission Granted Permission granted to share info w AGENCY: [place] and DME agencies  Emotional Assessment Attitude/Demeanor/Rapport: Engaged Affect (typically observed): Accepting Orientation: : Oriented to Self, Oriented to Place, Oriented to  Time, Oriented to Situation Alcohol / Substance Use: Not Applicable Psych Involvement: No (comment)  Admission diagnosis:  Fall [W19.XXXA] AKI (acute kidney injury) ([place]) [N17.9] Patient Active Problem List   Diagnosis Date Noted   AKI (acute kidney injury) ([place]) 08/20/2020   Morbid obesity with BMI of 60.0-69.9, adult ([place]) 08/20/2020   Knee pain 08/20/2020   Intertrigo 03/27/2020   Anemia 08/23/2019   Polyethylene liner wear following total hip arthroplasty requiring isolated polyethylene liner exchange ([place]) 07/12/2019   Depression, recurrent ([place]) 07/12/2018    Migraine  without aura and without status migrainosus, not intractable 09/10/2017   Post-traumatic osteoarthritis of right ankle 06/09/2016   Spondylosis without myelopathy or radiculopathy, lumbar region 06/09/2016   Cervical spinal stenosis 06/09/2016   Spinal stenosis, lumbar region, without neurogenic claudication 06/09/2016   Chronic gouty arthritis 06/04/2016   Vitamin D deficiency 06/02/2016   Hyperlipidemia 06/02/2016   Closed right ankle fracture 05/01/2016   Colon polyps 05/01/2016   Fibromyalgia syndrome 05/01/2016   Syncope 03/30/2016   Chest pain 03/30/2016   Fall 03/30/2016   GERD with esophagitis 06/21/2015   Pharyngeal dysphagia 06/21/2015   Obesity 05/06/2013   Acute nontraumatic kidney injury ([place]) 05/05/2013   Acute posthemorrhagic anemia 09/06/2012   Gout 09/05/2012   Osteoarthritis of left hip 08/05/2012   Type 2 diabetes mellitus with diabetic polyneuropathy, without long-term current use of insulin ([place]) 08/05/2012   Hypertension 08/05/2012   Asthma, chronic 08/05/2012   Sleep apnea 08/05/2012   PCP:  Flossie Buffy, NP Pharmacy:   [id number] - [place], [place] [address] Phone: [phone] Fax: [phone]  Readmission Risk Interventions No flowsheet data found.

## 2020-08-21 NOTE — Progress Notes (Addendum)
PROGRESS NOTE  Sandra Brennan TKW:[id number] DOB: 1945/03/19   PCP: Flossie Buffy, NP  Patient is from: Home.  Lives with and takes care of her mother and grandchild.  Uses walker at baseline.  DOA: 08/19/2020 LOS: 0  Chief complaints:  Chief Complaint  Patient presents with   Fall     Brief Narrative / Interim history: 75 year old F with PMH of osteoarthritis/chronic back pain with radiculopathy followed at pain clinic, COPD not on oxygen, OSA not on CPAP, HTN, CAD, anemia and diet-controlled diabetes presenting with back pain, right shoulder pain, neck pain, bilateral knee pain and hip pain after she fell off a dresser while trying to fix a curtain rod.  The dresser fell on her and she was not able to get it off until her daughter arrived about 45 minutes later.  Denies hitting her head or loss of consciousness.  No prodromes leading to fall.  In ED, hemodynamically stable.  Had multiple imaging including CXR, DG left and right shoulder, left and right ankle, left knee and pelvis, and CT head, cervical and lumbar spine which did not show acute finding other than significant osteoarthritis.  Blood work significant for AKI with creatinine of 2.23 from baseline of 1.17.  BUN was 30.  Admission requested for pain control and AKI.    Subjective: No major events overnight of this morning.  She reports lower back pain.  She rates her pain 12/10.  Pain radiates down her right leg.  This is similar to her previous pain but worse.  Also reports some numbness but on the left side.  She denies bowel or bladder habit change.  She denies headache, chest pain or dyspnea.  Objective: Vitals:   08/20/20 1923 08/20/20 2038 08/21/20 0454 08/21/20 0736  BP:  (!) 129/52 123/70   Pulse:  88 85   Resp:  17 16   Temp:  98.8 F (37.1 C) 99 F (37.2 C)   TempSrc:  Oral Oral   SpO2: 93% 91% 95% 94%  Weight:      Height:        Intake/Output Summary (Last 24 hours) at 08/21/2020 1152 Last data  filed at 08/21/2020 1126 Gross per 24 hour  Intake 2552.23 ml  Output 2975 ml  Net -422.77 ml   Filed Weights   08/19/20 2341  Weight: 135.8 kg    Examination:  GENERAL: No apparent distress.  Nontoxic. HEENT: MMM.  Vision and hearing grossly intact.  NECK: Supple.   RESP: On RA.  No IWOB.  Fair aeration bilaterally. CVS:  RRR. Heart sounds normal.  ABD/GI/GU: BS+. Abd soft, NTND.  MSK/EXT: Status post bilateral knee surgeries.  Deformity in right ankle from previous fracture.  Bilateral lower extremity weakness.  RLE appears to be shorter than LLE.  Diffuse tenderness across the lower back. SKIN: no apparent skin lesion or wound NEURO: Awake, alert and oriented appropriately.  No apparent focal neuro deficit other than BLE weakness, 3/5 bilaterally. PSYCH: Calm. Normal affect.   Procedures:  None  Microbiology summarized: JMEQA-83 and influenza PCR nonreactive.  Assessment & Plan: # Accidental fall at home: No head trauma or LOC.  No prodrome leading to this. Acute on chronic lower back pain-likely due to the above.  Patient with known progressive multilevel lumbar DDD with multilevel moderate to severe neuroforaminal stenosis.  She is followed at the pain clinic.  She is on tramadol and Lyrica at home.  Reports her pain to essentially be exacerbation of her  chronic pain. No signs cord compression. No fractures on imaging -Continue as needed IV Dilaudid for severe pain -Add p.o. oxycodone as needed for moderate pain -Scheduled Tylenol around-the-clock -Continue home tramadol and Lyrica. -PT/OT eval, pt initially declined snf but now is interested, will ask pt to re-eval patient  # AKI/azotemia:  Resolved w/ IVF. Was down for a little while at home. Ck wnl. Home lisin/hctz may have contributed - off ivf - holding lisinopril/hctz  Essential hypertension: Normotensive. -Continue holding home lisinopril/HCTZ   History of "mild CAD". -Continue home statin and  aspirin.  Diffuse osteoarthritis -Pain control as above.   History of COPD Controlled -Continue home inhalers  Normocytic anemia: H&H stable. Labs suggestive of chronic disease  Morbid obesity Body mass index is 60.47 kg/m.  -Encourage lifestyle change to lose weight.       DVT prophylaxis:  heparin injection 5,000 Units Start: 08/20/20 0600  Code Status: Full code Family Communication: Patient and/or RN. Available if any question.  Level of care: Med-Surg Status is: Observation. UR advises continuing obs status  Dispo: The patient is from: Home              Anticipated d/c is to:  To be determined after therapy evaluation              Patient currently is not medically stable to d/c.   Difficult to place patient No       Consultants:  None   Sch Meds:  Scheduled Meds:  acetaminophen  1,000 mg Oral Q8H   aspirin EC  81 mg Oral Daily   atorvastatin  10 mg Oral q1800   heparin  5,000 Units Subcutaneous Q8H   mometasone-formoterol  2 puff Inhalation BID   pregabalin  150 mg Oral BID   traMADol  50 mg Oral TID   Continuous Infusions:   PRN Meds:.albuterol, HYDROmorphone (DILAUDID) injection, ondansetron **OR** ondansetron (ZOFRAN) IV, oxyCODONE, polyethylene glycol, senna-docusate  Antimicrobials: Anti-infectives (From admission, onward)    None        I have personally reviewed the following labs and images: CBC: Recent Labs  Lab 08/20/20 0210 08/20/20 0410 08/21/20 0358  WBC 8.2 7.9 5.7  HGB 11.3* 11.2* 9.8*  HCT 36.5 35.5* 31.7*  MCV 93.8 89.2 91.9  PLT 165 192 201   BMP &GFR Recent Labs  Lab 08/20/20 0210 08/20/20 0410 08/21/20 0358  NA 139 140 137  K 4.2 4.2 4.2  CL 106 108 104  CO2 22 22 27   GLUCOSE 100* 115* 105*  BUN 30* 30* 27*  CREATININE 2.23* 2.07* 1.37*  CALCIUM 8.4* 8.6* 8.5*  MG  --   --  1.8  PHOS  --   --  3.8   Estimated Creatinine Clearance: 46.3 mL/min (A) (by C-G formula based on SCr of 1.37 mg/dL  (H)). Liver & Pancreas: Recent Labs  Lab 08/21/20 0358  ALBUMIN 3.2*   No results for input(s): LIPASE, AMYLASE in the last 168 hours. No results for input(s): AMMONIA in the last 168 hours. Diabetic: No results for input(s): HGBA1C in the last 72 hours. No results for input(s): GLUCAP in the last 168 hours. Cardiac Enzymes: Recent Labs  Lab 08/20/20 0452  CKTOTAL 223   No results for input(s): PROBNP in the last 8760 hours. Coagulation Profile: No results for input(s): INR, PROTIME in the last 168 hours. Thyroid Function Tests: No results for input(s): TSH, T4TOTAL, FREET4, T3FREE, THYROIDAB in the last 72 hours. Lipid Profile: No results  for input(s): CHOL, HDL, LDLCALC, TRIG, CHOLHDL, LDLDIRECT in the last 72 hours. Anemia Panel: Recent Labs    08/21/20 0358  VITAMINB12 877  FOLATE 41.4  FERRITIN 34  TIBC 296  IRON 39  RETICCTPCT 1.2   Urine analysis:    Component Value Date/Time   COLORURINE YELLOW 04/09/2018 1549   APPEARANCEUR CLEAR 04/09/2018 1549   LABSPEC 1.018 04/09/2018 1549   PHURINE 6.5 04/09/2018 1549   GLUCOSEU NEGATIVE 04/09/2018 1549   HGBUR NEGATIVE 04/09/2018 1549   BILIRUBINUR neg 09/04/2016 1107   KETONESUR NEGATIVE 04/09/2018 1549   PROTEINUR NEGATIVE 04/09/2018 1549   UROBILINOGEN 0.2 09/04/2016 1107   UROBILINOGEN 0.2 05/05/2013 1322   NITRITE neg 09/04/2016 1107   NITRITE NEGATIVE 03/30/2016 1245   LEUKOCYTESUR Small (1+) (A) 09/04/2016 1107   Sepsis Labs: Invalid input(s): PROCALCITONIN, [place]  Microbiology: Recent Results (from the past 240 hour(s))  Resp Panel by RT-PCR (Flu A&B, Covid) Nasopharyngeal Swab     Status: None   Collection Time: 08/20/20  3:16 AM   Specimen: Nasopharyngeal Swab; Nasopharyngeal(NP) swabs in vial transport medium  Result Value Ref Range Status   SARS Coronavirus 2 by RT PCR NEGATIVE NEGATIVE Final    Comment: (NOTE) SARS-CoV-2 target nucleic acids are NOT DETECTED.  The SARS-CoV-2 RNA is  generally detectable in upper respiratory specimens during the acute phase of infection. The lowest concentration of SARS-CoV-2 viral copies this assay can detect is 138 copies/mL. A negative result does not preclude SARS-Cov-2 infection and should not be used as the sole basis for treatment or other patient management decisions. A negative result may occur with  improper specimen collection/handling, submission of specimen other than nasopharyngeal swab, presence of viral mutation(s) within the areas targeted by this assay, and inadequate number of viral copies(<138 copies/mL). A negative result must be combined with clinical observations, patient history, and epidemiological information. The expected result is Negative.  Fact Sheet for Patients:  EntrepreneurPulse.com.au  Fact Sheet for Healthcare Providers:  IncredibleEmployment.be  This test is no t yet approved or cleared by the [place] FDA and  has been authorized for detection and/or diagnosis of SARS-CoV-2 by FDA under an Emergency Use Authorization (EUA). This EUA will remain  in effect (meaning this test can be used) for the duration of the COVID-19 declaration under Section 564(b)(1) of the Act, 21 U.S.C.section 360bbb-3(b)(1), unless the authorization is terminated  or revoked sooner.       Influenza A by PCR NEGATIVE NEGATIVE Final   Influenza B by PCR NEGATIVE NEGATIVE Final    Comment: (NOTE) The Xpert Xpress SARS-CoV-2/FLU/RSV plus assay is intended as an aid in the diagnosis of influenza from Nasopharyngeal swab specimens and should not be used as a sole basis for treatment. Nasal washings and aspirates are unacceptable for Xpert Xpress SARS-CoV-2/FLU/RSV testing.  Fact Sheet for Patients: EntrepreneurPulse.com.au  Fact Sheet for Healthcare Providers: IncredibleEmployment.be  This test is not yet approved or cleared by the [place] FDA and has been authorized for detection and/or diagnosis of SARS-CoV-2 by FDA under an Emergency Use Authorization (EUA). This EUA will remain in effect (meaning this test can be used) for the duration of the COVID-19 declaration under Section 564(b)(1) of the Act, 21 U.S.C. section 360bbb-3(b)(1), unless the authorization is terminated or revoked.  Performed at [facility], [place] [address] [place], [place] [address]     Radiology Studies: No results found.     Laurey Arrow, MD [facility]  If 7PM-7AM,  please contact night-coverage [email or web address] 08/21/2020, 11:52 AM

## 2020-08-22 DIAGNOSIS — N179 Acute kidney failure, unspecified: Secondary | ICD-10-CM | POA: Diagnosis not present

## 2020-08-22 LAB — BASIC METABOLIC PANEL
Anion gap: 8 (ref 5–15)
BUN: 23 mg/dL (ref 8–23)
CO2: 26 mmol/L (ref 22–32)
Calcium: 8.7 mg/dL — ABNORMAL LOW (ref 8.9–10.3)
Chloride: 105 mmol/L (ref 98–111)
Creatinine, Ser: 1.12 mg/dL — ABNORMAL HIGH (ref 0.44–1.00)
GFR, Estimated: 52 mL/min — ABNORMAL LOW (ref >60)
Glucose, Bld: 88 mg/dL (ref 70–99)
Potassium: 4.4 mmol/L (ref 3.5–5.1)
Sodium: 139 mmol/L (ref 135–145)

## 2020-08-22 LAB — RESP PANEL BY RT-PCR (FLU A&B, COVID) ARPGX2
Influenza A by PCR: NEGATIVE
Influenza B by PCR: NEGATIVE
SARS Coronavirus 2 by RT PCR: NEGATIVE

## 2020-08-22 NOTE — Progress Notes (Signed)
PROGRESS NOTE    Sandra Brennan  YNW:[id number] DOB: Feb 20, 1946 DOA: 08/19/2020 PCP: Flossie Buffy, NP   Brief Narrative:  This 75 years old female with PMH significant for osteoarthritis/chronic back pain with radiculopathy followed at the pain clinic, COPD not on oxygen, OSA not on CPAP, hypertension, CAD, anemia, diet-controlled diabetes presented to the ED with complaints of back pain, right shoulder pain, neck pain, right knee pain and hip pain after she fell off a dresser while trying to fix a curtain rod.  Subsequently the dresser fell on her and she was not able to get it off until her daughter arrived about 45 minutes later.  She denies hitting her head or loss of consciousness. In the ED she is hemodynamically stable,  had multiple imaging including chest x-ray, x-ray left and right shoulder, left and right ankle, left and right knee and pelvis and CT head, CT cervical and lumbar spine which did not show any acute finding other than significant osteoarthritis.  Blood work shows acute kidney injury with serum creatinine 2.23. Patient is admitted for acute kidney injury and adequate pain control.  Renal functions is improving pain is better controlled.  PT recommended skilled nursing facility.  Assessment & Plan:   Principal Problem:   AKI (acute kidney injury) ([place]) Active Problems:   Osteoarthritis of left hip   Hypertension   Asthma, chronic   Spinal stenosis, lumbar region, without neurogenic claudication   Morbid obesity with BMI of 60.0-69.9, adult (HCC)   Knee pain  Accidental fall at home:  Patient denies any head trauma or LOC,  no prodrome leading to this. Acute on chronic lower back pain most likely due to the fall.   Patient with known progressive multilevel lumbar DDD with multilevel moderate to severe neuroforaminal stenosis.   She is followed at the pain clinic.  She is on tramadol and Lyrica at home. No signs of cord compression. No fractures on  imaging Continue as needed IV Dilaudid for severe pain Continue oxycodone as needed for moderate pain Scheduled Tylenol around-the-clock Continue home tramadol and Lyrica. PT/OT eval, pt initially declined snf but now is interested.  Acute kidney injury : Resolved with IV fluids. CK normal, will resume lisinopril HCTZ   Essential hypertension: Blood pressure controlled, will resume home medications   CAD: Continue aspirin and statin.   Diffuse osteoarthritis Continue pain medications.   History of COPD Controlled Continue home inhalers   Normocytic anemia:  H&H stable. Labs suggestive of chronic disease   Morbid obesity Body mass index is 60.47 kg/m.  -Encourage lifestyle change to lose weight.    DVT prophylaxis: Heparin Code Status: Full code. Family Communication: No family at bedside. Disposition Plan:  Status is: Observation  The patient remains OBS appropriate and will d/c before 2 midnights.  Dispo: The patient is from: Home              Anticipated d/c is to: SNF              Patient currently is not medically stable to d/c.   Difficult to place patient No  Consultants:  None  Procedures:  CT Head, C spine.  Antimicrobials:   Anti-infectives (From admission, onward)    None        Subjective: Patient was seen and examined at bedside.  Overnight events noted.   Patient reports feeling dizzy when she stands up , she also reports having worsening headache, denies any nausea and vomiting.  Objective:  Vitals:   08/21/20 2036 08/22/20 0515 08/22/20 0838 08/22/20 1452  BP: (!) 165/70 (!) 155/75  (!) 177/75  Pulse: 89 66  77  Resp: 16 16  18   Temp: 98 F (36.7 C) 98.7 F (37.1 C)  98.1 F (36.7 C)  TempSrc: Oral Oral  Oral  SpO2: 99% 100% 100% 92%  Weight:      Height:        Intake/Output Summary (Last 24 hours) at 08/22/2020 1546 Last data filed at 08/22/2020 1451 Gross per 24 hour  Intake 240 ml  Output 2000 ml  Net -1760 ml    Filed Weights   08/19/20 2341  Weight: 135.8 kg    Examination:  General exam: Appears calm and comfortable, not in any acute distress. Respiratory system: Clear to auscultation. Respiratory effort normal. Cardiovascular system: S1 & S2 heard, RRR. No JVD, murmurs, rubs, gallops or clicks. No pedal edema. Gastrointestinal system: Abdomen is nondistended, soft and nontender. No organomegaly or masses felt. Normal bowel sounds heard. Central nervous system: Alert and oriented. No focal neurological deficits. Extremities: Symmetric 5 x 5 power.  No edema, no cyanosis, no clubbing. Skin: No rashes, lesions or ulcers Psychiatry: Judgement and insight appear normal. Mood & affect appropriate.     Data Reviewed: I have personally reviewed following labs and imaging studies  CBC: Recent Labs  Lab 08/20/20 0210 08/20/20 0410 08/21/20 0358  WBC 8.2 7.9 5.7  HGB 11.3* 11.2* 9.8*  HCT 36.5 35.5* 31.7*  MCV 93.8 89.2 91.9  PLT 165 192 335   Basic Metabolic Panel: Recent Labs  Lab 08/20/20 0210 08/20/20 0410 08/21/20 0358 08/22/20 0319  NA 139 140 137 139  K 4.2 4.2 4.2 4.4  CL 106 108 104 105  CO2 22 22 27 26   GLUCOSE 100* 115* 105* 88  BUN 30* 30* 27* 23  CREATININE 2.23* 2.07* 1.37* 1.12*  CALCIUM 8.4* 8.6* 8.5* 8.7*  MG  --   --  1.8  --   PHOS  --   --  3.8  --    GFR: Estimated Creatinine Clearance: 56.6 mL/min (A) (by C-G formula based on SCr of 1.12 mg/dL (H)). Liver Function Tests: Recent Labs  Lab 08/21/20 0358  ALBUMIN 3.2*   No results for input(s): LIPASE, AMYLASE in the last 168 hours. No results for input(s): AMMONIA in the last 168 hours. Coagulation Profile: No results for input(s): INR, PROTIME in the last 168 hours. Cardiac Enzymes: Recent Labs  Lab 08/20/20 0452  CKTOTAL 223   BNP (last 3 results) No results for input(s): PROBNP in the last 8760 hours. HbA1C: No results for input(s): HGBA1C in the last 72 hours. CBG: No results for  input(s): GLUCAP in the last 168 hours. Lipid Profile: No results for input(s): CHOL, HDL, LDLCALC, TRIG, CHOLHDL, LDLDIRECT in the last 72 hours. Thyroid Function Tests: No results for input(s): TSH, T4TOTAL, FREET4, T3FREE, THYROIDAB in the last 72 hours. Anemia Panel: Recent Labs    08/21/20 0358  VITAMINB12 877  FOLATE 41.4  FERRITIN 34  TIBC 296  IRON 39  RETICCTPCT 1.2   Sepsis Labs: No results for input(s): PROCALCITON, LATICACIDVEN in the last 168 hours.  Recent Results (from the past 240 hour(s))  Resp Panel by RT-PCR (Flu A&B, Covid) Nasopharyngeal Swab     Status: None   Collection Time: 08/20/20  3:16 AM   Specimen: Nasopharyngeal Swab; Nasopharyngeal(NP) swabs in vial transport medium  Result Value Ref Range Status   SARS  Coronavirus 2 by RT PCR NEGATIVE NEGATIVE Final    Comment: (NOTE) SARS-CoV-2 target nucleic acids are NOT DETECTED.  The SARS-CoV-2 RNA is generally detectable in upper respiratory specimens during the acute phase of infection. The lowest concentration of SARS-CoV-2 viral copies this assay can detect is 138 copies/mL. A negative result does not preclude SARS-Cov-2 infection and should not be used as the sole basis for treatment or other patient management decisions. A negative result may occur with  improper specimen collection/handling, submission of specimen other than nasopharyngeal swab, presence of viral mutation(s) within the areas targeted by this assay, and inadequate number of viral copies(<138 copies/mL). A negative result must be combined with clinical observations, patient history, and epidemiological information. The expected result is Negative.  Fact Sheet for Patients:  EntrepreneurPulse.com.au  Fact Sheet for Healthcare Providers:  IncredibleEmployment.be  This test is no t yet approved or cleared by the [place] FDA and  has been authorized for detection and/or diagnosis of  SARS-CoV-2 by FDA under an Emergency Use Authorization (EUA). This EUA will remain  in effect (meaning this test can be used) for the duration of the COVID-19 declaration under Section 564(b)(1) of the Act, 21 U.S.C.section 360bbb-3(b)(1), unless the authorization is terminated  or revoked sooner.       Influenza A by PCR NEGATIVE NEGATIVE Final   Influenza B by PCR NEGATIVE NEGATIVE Final    Comment: (NOTE) The Xpert Xpress SARS-CoV-2/FLU/RSV plus assay is intended as an aid in the diagnosis of influenza from Nasopharyngeal swab specimens and should not be used as a sole basis for treatment. Nasal washings and aspirates are unacceptable for Xpert Xpress SARS-CoV-2/FLU/RSV testing.  Fact Sheet for Patients: EntrepreneurPulse.com.au  Fact Sheet for Healthcare Providers: IncredibleEmployment.be  This test is not yet approved or cleared by the [place] FDA and has been authorized for detection and/or diagnosis of SARS-CoV-2 by FDA under an Emergency Use Authorization (EUA). This EUA will remain in effect (meaning this test can be used) for the duration of the COVID-19 declaration under Section 564(b)(1) of the Act, 21 U.S.C. section 360bbb-3(b)(1), unless the authorization is terminated or revoked.  Performed at [facility], [place] [address], [place], [place] [address]     Radiology Studies: No results found.  Scheduled Meds:  acetaminophen  1,000 mg Oral Q8H   aspirin EC  81 mg Oral Daily   atorvastatin  10 mg Oral q1800   heparin  5,000 Units Subcutaneous Q8H   mometasone-formoterol  2 puff Inhalation BID   pregabalin  150 mg Oral BID   traMADol  50 mg Oral TID   Continuous Infusions:   LOS: 0 days    Time spent: 35 mins    Arzell Mcgeehan, MD [facility]   If 7PM-7AM, please contact night-coverage

## 2020-08-22 NOTE — TOC Progression Note (Signed)
Transition of Care [facility]) - Progression Note   Patient Details  Name: SHRUTI ARREY MRN: [id number] Date of Birth: 14-Mar-1945  [facility] CM/SW [place], [place] Phone Number: 08/22/2020, 2:27 PM  Clinical Narrative: Patient has selected [facility] for SNF. CSW called Bernadene Bell to complete insurance authorization. Start date is 08/22/20 and the next review date is 08/24/20. CSW called Claiborne Billings with [facility] who is agreeable to accepting the patient pending insurance authorization and a new COVID test. Patient will now discharge tomorrow. Kelly updated. TOC to follow.  Barriers to Discharge: Continued Medical Work up  Expected Discharge Plan and Services In-house Referral: Clinical Social Work Post Acute Care Choice: Museum/gallery conservator, Home Health Living arrangements for the past 2 months: Apartment              DME Arranged: Walker rolling DME Agency: [employer] Date DME Agency Contacted: 08/21/20 Time DME Agency Contacted: [phone] Representative spoke with at DME Agency: Freda Munro HH Arranged: PT, OT [facility] Agency: [place] Date [place]: 08/21/20 Time [place]: [phone] Representative spoke with at [place]: Amy  Readmission Risk Interventions No flowsheet data found.

## 2020-08-23 DIAGNOSIS — M25572 Pain in left ankle and joints of left foot: Secondary | ICD-10-CM | POA: Diagnosis not present

## 2020-08-23 DIAGNOSIS — R519 Headache, unspecified: Secondary | ICD-10-CM | POA: Diagnosis not present

## 2020-08-23 DIAGNOSIS — R6889 Other general symptoms and signs: Secondary | ICD-10-CM | POA: Diagnosis not present

## 2020-08-23 DIAGNOSIS — M1A00X0 Idiopathic chronic gout, unspecified site, without tophus (tophi): Secondary | ICD-10-CM | POA: Diagnosis not present

## 2020-08-23 DIAGNOSIS — M4802 Spinal stenosis, cervical region: Secondary | ICD-10-CM | POA: Diagnosis not present

## 2020-08-23 DIAGNOSIS — Z20822 Contact with and (suspected) exposure to covid-19: Secondary | ICD-10-CM | POA: Diagnosis not present

## 2020-08-23 DIAGNOSIS — E1140 Type 2 diabetes mellitus with diabetic neuropathy, unspecified: Secondary | ICD-10-CM | POA: Diagnosis not present

## 2020-08-23 DIAGNOSIS — M48061 Spinal stenosis, lumbar region without neurogenic claudication: Secondary | ICD-10-CM | POA: Diagnosis not present

## 2020-08-23 DIAGNOSIS — E785 Hyperlipidemia, unspecified: Secondary | ICD-10-CM | POA: Diagnosis not present

## 2020-08-23 DIAGNOSIS — I10 Essential (primary) hypertension: Secondary | ICD-10-CM | POA: Diagnosis not present

## 2020-08-23 DIAGNOSIS — M25512 Pain in left shoulder: Secondary | ICD-10-CM | POA: Diagnosis not present

## 2020-08-23 DIAGNOSIS — M6281 Muscle weakness (generalized): Secondary | ICD-10-CM | POA: Diagnosis not present

## 2020-08-23 DIAGNOSIS — E559 Vitamin D deficiency, unspecified: Secondary | ICD-10-CM | POA: Diagnosis not present

## 2020-08-23 DIAGNOSIS — D649 Anemia, unspecified: Secondary | ICD-10-CM | POA: Diagnosis not present

## 2020-08-23 DIAGNOSIS — N179 Acute kidney failure, unspecified: Secondary | ICD-10-CM | POA: Diagnosis not present

## 2020-08-23 DIAGNOSIS — M542 Cervicalgia: Secondary | ICD-10-CM | POA: Diagnosis not present

## 2020-08-23 DIAGNOSIS — M5441 Lumbago with sciatica, right side: Secondary | ICD-10-CM | POA: Diagnosis not present

## 2020-08-23 DIAGNOSIS — E119 Type 2 diabetes mellitus without complications: Secondary | ICD-10-CM | POA: Diagnosis not present

## 2020-08-23 DIAGNOSIS — I252 Old myocardial infarction: Secondary | ICD-10-CM | POA: Diagnosis not present

## 2020-08-23 DIAGNOSIS — W19XXXD Unspecified fall, subsequent encounter: Secondary | ICD-10-CM | POA: Diagnosis not present

## 2020-08-23 DIAGNOSIS — M25511 Pain in right shoulder: Secondary | ICD-10-CM | POA: Diagnosis not present

## 2020-08-23 DIAGNOSIS — Z96653 Presence of artificial knee joint, bilateral: Secondary | ICD-10-CM | POA: Diagnosis not present

## 2020-08-23 DIAGNOSIS — M47816 Spondylosis without myelopathy or radiculopathy, lumbar region: Secondary | ICD-10-CM | POA: Diagnosis not present

## 2020-08-23 DIAGNOSIS — G43009 Migraine without aura, not intractable, without status migrainosus: Secondary | ICD-10-CM | POA: Diagnosis not present

## 2020-08-23 DIAGNOSIS — I2510 Atherosclerotic heart disease of native coronary artery without angina pectoris: Secondary | ICD-10-CM | POA: Diagnosis not present

## 2020-08-23 DIAGNOSIS — K219 Gastro-esophageal reflux disease without esophagitis: Secondary | ICD-10-CM | POA: Diagnosis not present

## 2020-08-23 DIAGNOSIS — Z743 Need for continuous supervision: Secondary | ICD-10-CM | POA: Diagnosis not present

## 2020-08-23 DIAGNOSIS — J45909 Unspecified asthma, uncomplicated: Secondary | ICD-10-CM | POA: Diagnosis not present

## 2020-08-23 DIAGNOSIS — W19XXXA Unspecified fall, initial encounter: Secondary | ICD-10-CM | POA: Diagnosis not present

## 2020-08-23 DIAGNOSIS — G4733 Obstructive sleep apnea (adult) (pediatric): Secondary | ICD-10-CM | POA: Diagnosis not present

## 2020-08-23 DIAGNOSIS — J449 Chronic obstructive pulmonary disease, unspecified: Secondary | ICD-10-CM | POA: Diagnosis not present

## 2020-08-23 DIAGNOSIS — M160 Bilateral primary osteoarthritis of hip: Secondary | ICD-10-CM | POA: Diagnosis not present

## 2020-08-23 DIAGNOSIS — R2689 Other abnormalities of gait and mobility: Secondary | ICD-10-CM | POA: Diagnosis not present

## 2020-08-23 DIAGNOSIS — M2012 Hallux valgus (acquired), left foot: Secondary | ICD-10-CM | POA: Diagnosis not present

## 2020-08-23 DIAGNOSIS — M25571 Pain in right ankle and joints of right foot: Secondary | ICD-10-CM | POA: Diagnosis not present

## 2020-08-23 DIAGNOSIS — Z96642 Presence of left artificial hip joint: Secondary | ICD-10-CM | POA: Diagnosis not present

## 2020-08-23 DIAGNOSIS — E1142 Type 2 diabetes mellitus with diabetic polyneuropathy: Secondary | ICD-10-CM | POA: Diagnosis not present

## 2020-08-23 LAB — BASIC METABOLIC PANEL
Anion gap: 6 (ref 5–15)
BUN: 20 mg/dL (ref 8–23)
CO2: 27 mmol/L (ref 22–32)
Calcium: 8.8 mg/dL — ABNORMAL LOW (ref 8.9–10.3)
Chloride: 104 mmol/L (ref 98–111)
Creatinine, Ser: 1.06 mg/dL — ABNORMAL HIGH (ref 0.44–1.00)
GFR, Estimated: 55 mL/min — ABNORMAL LOW (ref >60)
Glucose, Bld: 88 mg/dL (ref 70–99)
Potassium: 4.2 mmol/L (ref 3.5–5.1)
Sodium: 137 mmol/L (ref 135–145)

## 2020-08-23 LAB — CBC
HCT: 35.1 % — ABNORMAL LOW (ref 36.0–46.0)
Hemoglobin: 10.9 g/dL — ABNORMAL LOW (ref 12.0–15.0)
MCH: 28.4 pg (ref 26.0–34.0)
MCHC: 31.1 g/dL (ref 30.0–36.0)
MCV: 91.4 fL (ref 80.0–100.0)
Platelets: 213 10*3/uL (ref 150–400)
RBC: 3.84 MIL/uL — ABNORMAL LOW (ref 3.87–5.11)
RDW: 14.1 % (ref 11.5–15.5)
WBC: 5.5 10*3/uL (ref 4.0–10.5)
nRBC: 0 % (ref 0.0–0.2)

## 2020-08-23 LAB — MAGNESIUM: Magnesium: 1.7 mg/dL (ref 1.7–2.4)

## 2020-08-23 LAB — PHOSPHORUS: Phosphorus: 3.7 mg/dL (ref 2.5–4.6)

## 2020-08-23 MED ORDER — HYDROCHLOROTHIAZIDE 12.5 MG PO CAPS
12.5000 mg | ORAL_CAPSULE | Freq: Once | ORAL | Status: AC
  Administered 2020-08-23: 12.5 mg via ORAL
  Filled 2020-08-23: qty 1

## 2020-08-23 MED ORDER — LISINOPRIL 20 MG PO TABS
20.0000 mg | ORAL_TABLET | Freq: Once | ORAL | Status: AC
  Administered 2020-08-23: 20 mg via ORAL
  Filled 2020-08-23: qty 1

## 2020-08-23 NOTE — Discharge Summary (Addendum)
Physician Discharge Summary  Sandra Brennan YNW:[id number] DOB: [birth year] DOA: 08/19/2020  PCP: Flossie Buffy, NP  Admit date: 08/19/2020  Discharge date: 08/23/2020  Admitted From: Home.  Disposition:  SNF [facility])  Recommendations for Outpatient Follow-up:  Follow up with PCP in 1-2 weeks. Please obtain BMP/CBC in one week. Advised to continue current medications as prescribed.  Home Health:None. Equipment/Devices:None.  Discharge Condition: Good CODE STATUS:Full code Diet recommendation: Heart Healthy   Brief [facility] course: This 75 years old female with PMH significant for osteoarthritis /chronic back pain with radiculopathy followed at the pain clinic, COPD not on oxygen, OSA not on CPAP, hypertension, CAD, anemia, diet-controlled diabetes presented to the ED with complaints of back pain, right shoulder pain, neck pain, right knee pain and hip pain after she fell off a dresser while trying to fix a curtain rod.  Subsequently the dresser fell on her and she was not able to get it off until her daughter arrived about 45 minutes later.  She denies hitting her head or loss of consciousness. In the ED she was hemodynamically stable,  had multiple imaging including chest x-ray, x-ray left and right shoulder, left and right ankle, left and right knee and pelvis and CT head, CT cervical and lumbar spine which did not show any acute finding other than significant osteoarthritis.  Blood work shows acute kidney injury with serum creatinine 2.23. Baseline creatinine is normal.  Patient was admitted for acute kidney injury and adequate pain control.  All the imaging was unremarkable,  did not show any acute finding other than significant osteoarthritis.  Renal functions has improved with IV hydration.  Pain is better controlled.  PT recommended skilled nursing facility.  [facility] is being discharged [facility].  She was managed for below  problems.  Discharge Diagnoses:  Principal Problem:   AKI (acute kidney injury) ([place]) Active Problems:   Osteoarthritis of left hip   Hypertension   Asthma, chronic   Spinal stenosis, lumbar region, without neurogenic claudication   Morbid obesity with BMI of 60.0-69.9, adult (HCC)   Knee pain  Accidental fall at home:  Patient denies any head trauma or LOC,  no prodrome leading to this. Acute on chronic lower back pain most likely due to the fall.   Patient with known progressive multilevel lumbar DDD with multilevel moderate to severe neuroforaminal stenosis.   She is followed at the pain clinic.  She is on tramadol and Lyrica at home. No signs of cord compression. No fractures on imaging Scheduled Tylenol around-the-clock Continue home tramadol and Lyrica. PT/OT eval, pt initially declined snf but now is interested. Patient is being discharged to skilled nursing facility for rehabilitation.   Acute kidney injury : Resolved with IV fluids. CK normal, will resume lisinopril HCTZ   Essential hypertension: Blood pressure controlled, will resume home medications   CAD: Continue aspirin and statin.   Diffuse osteoarthritis Continue pain medications.   History of COPD Controlled Continue home inhalers   Normocytic anemia:  H&H stable. Labs suggestive of chronic disease   Morbid obesity Body mass index is 60.47 kg/m.  -Encourage lifestyle change to lose weight.    Discharge Instructions  Discharge Instructions     Call MD for:  persistant dizziness or light-headedness   Complete by: As directed    Call MD for:  persistant nausea and vomiting   Complete by: As directed    Call MD for:  severe uncontrolled pain  Complete by: As directed    Diet - low sodium heart healthy   Complete by: As directed    Diet Carb Modified   Complete by: As directed    Discharge instructions   Complete by: As directed    Advised to continue current medications as prescribed.    Increase activity slowly   Complete by: As directed       Allergies as of 08/23/2020       Reactions   Cymbalta [duloxetine Hcl] Anaphylaxis   Pineapple Shortness Of Breath   Sulfa Antibiotics Hives, Shortness Of Breath, Itching   REACTION: unknown   Influenza Vaccines Hives, Itching   Penicillins Hives   Theophyllines Hives        Medication List     STOP taking these medications    nystatin powder Commonly known as: MYCOSTATIN/NYSTOP       TAKE these medications    albuterol 108 (90 Base) MCG/ACT inhaler Commonly known as: VENTOLIN HFA INHALE 1-2 PUFFS INTO THE LUNGS EVERY 6 (SIX) HOURS AS NEEDED FOR WHEEZING OR SHORTNESS OF BREATH   allopurinol 100 MG tablet Commonly known as: ZYLOPRIM TAKE 1 TABLET BY MOUTH EVERY DAY   aspirin EC 81 MG tablet Take 81 mg by mouth daily.   atorvastatin 10 MG tablet Commonly known as: LIPITOR TAKE 1 TABLET (10 MG TOTAL) BY MOUTH DAILY AT 6 PM.   cetirizine 10 MG tablet Commonly known as: ZYRTEC Take 10 mg by mouth daily as needed.   cholecalciferol 25 MCG (1000 UNIT) tablet Commonly known as: VITAMIN D3 Take 1,000 Units by mouth daily.   cycloSPORINE 0.05 % ophthalmic emulsion Commonly known as: RESTASIS Restasis 0.05 % eye drops in a dropperette   EYE DROPS OP Apply to eye. Lubricant eye drops   lisinopril-hydrochlorothiazide 20-12.5 MG tablet Commonly known as: ZESTORETIC Take 1 tablet by mouth daily.   multivitamin tablet Take 1 tablet by mouth daily.   Nurtec 75 MG Tbdp Generic drug: Rimegepant Sulfate Take 75 mg by mouth as needed (take 1 at onset of headache, max is 75 in 24 hours).   omeprazole 20 MG capsule Commonly known as: PRILOSEC TAKE 1 CAPSULE BY MOUTH EVERY DAY   OVER THE COUNTER MEDICATION Cystic magnesium   pregabalin 150 MG capsule Commonly known as: LYRICA Take 1 capsule (150 mg total) by mouth 2 (two) times daily.   Sennosides 8.6 MG Caps Take 1 capsule by mouth daily.    Symbicort 160-4.5 MCG/ACT inhaler Generic drug: budesonide-formoterol INHALE 2 PUFFS INTO THE LUNGS IN THE MORNING AND AT BEDTIME. RINSE MOUTH AFTER EACH USE   traMADol 50 MG tablet Commonly known as: ULTRAM Take 1 tablet (50 mg total) by mouth 3 (three) times daily.        Contact information for follow-up providers     [facility] Follow up.   Specialty: Home Health Services Why: Agency is now called [facility]. Will provide PT and OT. Contact information: [address] [place] [address] [phone]         Flossie Buffy, NP Follow up in 1 week(s).   Specialty: Internal Medicine Contact information: [address] [address] [phone]              Contact information for after-discharge care     Destination     [facility] Preferred SNF .   Service: Skilled Nursing Contact information: [address] [place] [phone]  Allergies  Allergen Reactions   Cymbalta [Duloxetine Hcl] Anaphylaxis   Pineapple Shortness Of Breath   Sulfa Antibiotics Hives, Shortness Of Breath and Itching    REACTION: unknown   Influenza Vaccines Hives and Itching   Penicillins Hives   Theophyllines Hives    Consultations: None   Procedures/Studies: DG Chest 1 View  Result Date: 08/20/2020 CLINICAL DATA:  Status post fall. EXAM: CHEST  1 VIEW COMPARISON:  Chest x-ray 05/23/2020 FINDINGS: The heart size and mediastinal contours are unchanged. No focal consolidation. No pulmonary edema. No pleural effusion. No pneumothorax. No acute osseous abnormality. Redemonstration of severe bilateral shoulder degenerative changes. IMPRESSION: No active disease. Electronically Signed   By: Iven Finn M.D.   On: 08/20/2020 02:23   DG Pelvis 1-2 Views  Result Date: 08/20/2020 CLINICAL DATA:  Fall. EXAM: PELVIS - 1-2 VIEW COMPARISON:  None. FINDINGS: Previous left hip  arthroplasty. Moderate degenerative changes are noted in right hip. No signs of acute fracture or dislocation. Note: The left iliac wing is excluded from the field of view. Degenerative changes are noted involving the left SI joint. IMPRESSION: 1. No acute findings. 2. Moderate right hip osteoarthritis. 3. Left iliac wing excluded from the field of view. Electronically Signed   By: Kerby Moors M.D.   On: 08/20/2020 02:17   DG Shoulder Right  Result Date: 08/20/2020 CLINICAL DATA:  Fall EXAM: RIGHT SHOULDER - 2+ VIEW COMPARISON:  09/05/2015 FINDINGS: Advanced degenerative change of the humeral head and acromioclavicular joint is unchanged. There is no acute fracture or dislocation. IMPRESSION: No acute fracture or dislocation of the right shoulder. Electronically Signed   By: Ulyses Jarred M.D.   On: 08/20/2020 02:09   DG Ankle Complete Left  Result Date: 08/20/2020 CLINICAL DATA:  Status post fall. EXAM: LEFT ANKLE COMPLETE - 3+ VIEW COMPARISON:  None. FINDINGS: No evidence of fracture, dislocation, or joint effusion. Degenerative changes of the bones of the left ankle and foot. Hallux valgus noted. Plantar calcaneal spur. No aggressive appearing focal bone abnormality. Anterior left ankle subcutaneus soft tissue edema. Vascular calcifications. IMPRESSION: No acute displaced fracture or dislocation. Electronically Signed   By: Iven Finn M.D.   On: 08/20/2020 02:05   DG Ankle Complete Right  Result Date: 08/20/2020 CLINICAL DATA:  Status post fall. EXAM: RIGHT ANKLE - COMPLETE 3+ VIEW COMPARISON:  04/23/2016 FINDINGS: No signs of acute fracture or dislocation. Marked, chronic arthro pathic changes involving the right ankle identified with chronic collapse of the talus with increased sclerosis and bony hypertrophy centered around the tibiotalar joint. Progressive increased sclerosis involving the navicular bone is identified which appears partially collapsed with surrounding secondary degenerative  changes. Remote fracture deformity involving the distal tibia is identified which appears unchanged from previous exam. Medial soft tissue swelling is identified. Vascular calcifications noted. IMPRESSION: 1. No acute findings. 2. Marked, chronic arthropathic changes involving the right ankle. Findings concerning for neuropathic arthropathy. 3. Progressive sclerosis involving the navicular bone which appears partially collapsed with surrounding secondary degenerative changes. 4. Medial soft tissue swelling. Electronically Signed   By: Kerby Moors M.D.   On: 08/20/2020 02:14   CT HEAD WO CONTRAST  Result Date: 08/20/2020 CLINICAL DATA:  Status post fall EXAM: CT HEAD WITHOUT CONTRAST CT CERVICAL SPINE WITHOUT CONTRAST TECHNIQUE: Multidetector CT imaging of the head and cervical spine was performed following the standard protocol without intravenous contrast. Multiplanar CT image reconstructions of the cervical spine were also generated. COMPARISON:  CT head 11/17/2008, MR head  03/30/2016, CT head and C-spine 03/30/2016 FINDINGS: CT HEAD FINDINGS Brain: Patchy and confluent areas of decreased attenuation are noted throughout the deep and periventricular white matter of the cerebral hemispheres bilaterally, compatible with chronic microvascular ischemic disease. No evidence of large-territorial acute infarction. No parenchymal hemorrhage. No mass lesion. No extra-axial collection. No mass effect or midline shift. No hydrocephalus. Basilar cisterns are patent. Vascular: No hyperdense vessel. Skull: Hyperostosis frontalis interna. No acute fracture or focal lesion. Sinuses/Orbits: Chronic fluid noted within the bilateral mastoid air cells. Paranasal sinuses cells are clear. No fluid noted within the middle ears bilaterally. The orbits are unremarkable. Other: None. CT CERVICAL SPINE FINDINGS Alignment: Normal. Skull base and vertebrae: Markedly limited evaluation of cervical spine starting at the C3 level due to  quantum mottle artifact. Multilevel degenerative changes of the spine. Multilevel degenerative changes of the spine with associated at least moderate multilevel osseous neural foraminal stenosis. No definite findings of severe osseous central canal stenosis with limited evaluation. No acute fracture. No aggressive appearing focal osseous lesion or focal pathologic process. Soft tissues and spinal canal: No prevertebral fluid or swelling. No visible canal hematoma. Upper chest: Unremarkable. Other: None. IMPRESSION: 1. No acute intracranial abnormality. 2. No acute displaced fracture or traumatic listhesis of the cervical spine at the C1 and C2 level. Markedly limited evaluation of cervical spine starting at the C3 level due to quantum mottle artifact. Electronically Signed   By: Iven Finn M.D.   On: 08/20/2020 02:12   CT CERVICAL SPINE WO CONTRAST  Result Date: 08/20/2020 CLINICAL DATA:  Status post fall EXAM: CT HEAD WITHOUT CONTRAST CT CERVICAL SPINE WITHOUT CONTRAST TECHNIQUE: Multidetector CT imaging of the head and cervical spine was performed following the standard protocol without intravenous contrast. Multiplanar CT image reconstructions of the cervical spine were also generated. COMPARISON:  CT head 11/17/2008, MR head 03/30/2016, CT head and C-spine 03/30/2016 FINDINGS: CT HEAD FINDINGS Brain: Patchy and confluent areas of decreased attenuation are noted throughout the deep and periventricular white matter of the cerebral hemispheres bilaterally, compatible with chronic microvascular ischemic disease. No evidence of large-territorial acute infarction. No parenchymal hemorrhage. No mass lesion. No extra-axial collection. No mass effect or midline shift. No hydrocephalus. Basilar cisterns are patent. Vascular: No hyperdense vessel. Skull: Hyperostosis frontalis interna. No acute fracture or focal lesion. Sinuses/Orbits: Chronic fluid noted within the bilateral mastoid air cells. Paranasal sinuses  cells are clear. No fluid noted within the middle ears bilaterally. The orbits are unremarkable. Other: None. CT CERVICAL SPINE FINDINGS Alignment: Normal. Skull base and vertebrae: Markedly limited evaluation of cervical spine starting at the C3 level due to quantum mottle artifact. Multilevel degenerative changes of the spine. Multilevel degenerative changes of the spine with associated at least moderate multilevel osseous neural foraminal stenosis. No definite findings of severe osseous central canal stenosis with limited evaluation. No acute fracture. No aggressive appearing focal osseous lesion or focal pathologic process. Soft tissues and spinal canal: No prevertebral fluid or swelling. No visible canal hematoma. Upper chest: Unremarkable. Other: None. IMPRESSION: 1. No acute intracranial abnormality. 2. No acute displaced fracture or traumatic listhesis of the cervical spine at the C1 and C2 level. Markedly limited evaluation of cervical spine starting at the C3 level due to quantum mottle artifact. Electronically Signed   By: Iven Finn M.D.   On: 08/20/2020 02:12   CT Lumbar Spine Wo Contrast  Result Date: 08/20/2020 CLINICAL DATA:  Status post fall.  Low back pain. EXAM: CT LUMBAR SPINE WITHOUT  CONTRAST TECHNIQUE: Multidetector CT imaging of the lumbar spine was performed without intravenous contrast administration. Multiplanar CT image reconstructions were also generated. COMPARISON:  MRI lumbar spine 08/12/2019 FINDINGS: Segmentation: 5 lumbar type vertebrae. Alignment: Normal. Vertebrae: Limited evaluation due to quantum mottle artifact. Severe multilevel degenerative changes of the spine including intervertebral disc space vacuum phenomenon, facet arthropathy, osteophyte formation, posterior disc osteophyte formation, fusion of the L1-L2 vertebral bodies. Associated multilevel moderate to severe osseous neural foraminal stenosis as well as multilevel severe osseous central canal stenosis. No  definite acute displaced fracture or focal pathologic process. Paraspinal and other soft tissues: Negative. Other: Vacuum phenomenon of the sacroiliac joints. No ankylosis. Visualize portions of the sacral spine grossly unremarkable. Atherosclerotic plaque of the aorta. Visualized portion of the retroperitoneum unremarkable. IMPRESSION: 1. No acute displaced fracture or traumatic listhesis of the lumbar spine with limited evaluation due to quantum mottle artifact. 2. Severe multilevel degenerative changes of the spine with redemonstration of moderate to severe multilevel osseous neural foraminal stenosis and multilevel severe osseous spinal canal stenosis. 3. Aortic Atherosclerosis (ICD10-I70.0). Electronically Signed   By: Iven Finn M.D.   On: 08/20/2020 02:21   DG Shoulder Left  Result Date: 08/20/2020 CLINICAL DATA:  Status post fall. EXAM: LEFT SHOULDER - 2+ VIEW COMPARISON:  05/23/2020 FINDINGS: No dislocation identified. No fracture. Marked narrowing of the glenohumeral joint is identified with increased sclerosis of the humeral head and bony hypertrophy along the margins of the glenoid. The joint space appears indistinct. Cannot exclude underlying inflammatory arthropathy. IMPRESSION: 1. No fracture or dislocation. 2. Marked narrowing of the glenohumeral joint with bony overgrowth. The joint space is indistinct. Cannot exclude inflammatory arthropathy. Electronically Signed   By: Kerby Moors M.D.   On: 08/20/2020 02:09   DG Knee Complete 4 Views Left  Result Date: 08/20/2020 CLINICAL DATA:  Fall. EXAM: LEFT KNEE - COMPLETE 4+ VIEW COMPARISON:  None FINDINGS: Previous left total knee arthroplasty. No acute fracture or dislocation identified. No joint effusion identified. Several loose bodies identified within the posterior joint space. IMPRESSION: 1. No acute findings. 2. Loose bodies within the posterior joint space. Electronically Signed   By: Kerby Moors M.D.   On: 08/20/2020 02:15      CT Head, Cervical spine   Subjective: Patient was seen and examined at bedside.  She feels better and being discharged to SNF for rehab.  She reports pain is better controlled.  and wants to be discharged.  Discharge Exam: Vitals:   08/23/20 0555 08/23/20 0854  BP: 138/78   Pulse: 62   Resp: 18   Temp: 98.2 F (36.8 C)   SpO2: 95% 94%   Vitals:   08/22/20 2009 08/22/20 2221 08/23/20 0555 08/23/20 0854  BP:  (!) 168/71 138/78   Pulse:  70 62   Resp:  18 18   Temp:  98.2 F (36.8 C) 98.2 F (36.8 C)   TempSrc:  Oral Oral   SpO2: 93% 93% 95% 94%  Weight:      Height:        General: Pt is alert, awake, not in acute distress Cardiovascular: RRR, S1/S2 +, no rubs, no gallops Respiratory: CTA bilaterally, no wheezing, no rhonchi Abdominal: Soft, NT, ND, bowel sounds + Extremities: no edema, no cyanosis    The results of significant diagnostics from this hospitalization (including imaging, microbiology, ancillary and laboratory) are listed below for reference.     Microbiology: Recent Results (from the past 240 hour(s))  Resp Panel by  RT-PCR (Flu A&B, Covid) Nasopharyngeal Swab     Status: None   Collection Time: 08/20/20  3:16 AM   Specimen: Nasopharyngeal Swab; Nasopharyngeal(NP) swabs in vial transport medium  Result Value Ref Range Status   SARS Coronavirus 2 by RT PCR NEGATIVE NEGATIVE Final    Comment: (NOTE) SARS-CoV-2 target nucleic acids are NOT DETECTED.  The SARS-CoV-2 RNA is generally detectable in upper respiratory specimens during the acute phase of infection. The lowest concentration of SARS-CoV-2 viral copies this assay can detect is 138 copies/mL. A negative result does not preclude SARS-Cov-2 infection and should not be used as the sole basis for treatment or other patient management decisions. A negative result may occur with  improper specimen collection/handling, submission of specimen other than nasopharyngeal swab, presence of viral  mutation(s) within the areas targeted by this assay, and inadequate number of viral copies(<138 copies/mL). A negative result must be combined with clinical observations, patient history, and epidemiological information. The expected result is Negative.  Fact Sheet for Patients:  EntrepreneurPulse.com.au  Fact Sheet for Healthcare Providers:  IncredibleEmployment.be  This test is no t yet approved or cleared by the [place] FDA and  has been authorized for detection and/or diagnosis of SARS-CoV-2 by FDA under an Emergency Use Authorization (EUA). This EUA will remain  in effect (meaning this test can be used) for the duration of the COVID-19 declaration under Section 564(b)(1) of the Act, 21 U.S.C.section 360bbb-3(b)(1), unless the authorization is terminated  or revoked sooner.       Influenza A by PCR NEGATIVE NEGATIVE Final   Influenza B by PCR NEGATIVE NEGATIVE Final    Comment: (NOTE) The Xpert Xpress SARS-CoV-2/FLU/RSV plus assay is intended as an aid in the diagnosis of influenza from Nasopharyngeal swab specimens and should not be used as a sole basis for treatment. Nasal washings and aspirates are unacceptable for Xpert Xpress SARS-CoV-2/FLU/RSV testing.  Fact Sheet for Patients: EntrepreneurPulse.com.au  Fact Sheet for Healthcare Providers: IncredibleEmployment.be  This test is not yet approved or cleared by the [place] FDA and has been authorized for detection and/or diagnosis of SARS-CoV-2 by FDA under an Emergency Use Authorization (EUA). This EUA will remain in effect (meaning this test can be used) for the duration of the COVID-19 declaration under Section 564(b)(1) of the Act, 21 U.S.C. section 360bbb-3(b)(1), unless the authorization is terminated or revoked.  Performed at [facility], [place] [address], [place], [place] [address]   Resp Panel by RT-PCR  (Flu A&B, Covid) Nasopharyngeal Swab     Status: None   Collection Time: 08/22/20  3:03 PM   Specimen: Nasopharyngeal Swab; Nasopharyngeal(NP) swabs in vial transport medium  Result Value Ref Range Status   SARS Coronavirus 2 by RT PCR NEGATIVE NEGATIVE Final    Comment: (NOTE) SARS-CoV-2 target nucleic acids are NOT DETECTED.  The SARS-CoV-2 RNA is generally detectable in upper respiratory specimens during the acute phase of infection. The lowest concentration of SARS-CoV-2 viral copies this assay can detect is 138 copies/mL. A negative result does not preclude SARS-Cov-2 infection and should not be used as the sole basis for treatment or other patient management decisions. A negative result may occur with  improper specimen collection/handling, submission of specimen other than nasopharyngeal swab, presence of viral mutation(s) within the areas targeted by this assay, and inadequate number of viral copies(<138 copies/mL). A negative result must be combined with clinical observations, patient history, and epidemiological information. The expected result is Negative.  Fact Sheet for  Patients:  EntrepreneurPulse.com.au  Fact Sheet for Healthcare Providers:  IncredibleEmployment.be  This test is no t yet approved or cleared by the [place] FDA and  has been authorized for detection and/or diagnosis of SARS-CoV-2 by FDA under an Emergency Use Authorization (EUA). This EUA will remain  in effect (meaning this test can be used) for the duration of the COVID-19 declaration under Section 564(b)(1) of the Act, 21 U.S.C.section 360bbb-3(b)(1), unless the authorization is terminated  or revoked sooner.       Influenza A by PCR NEGATIVE NEGATIVE Final   Influenza B by PCR NEGATIVE NEGATIVE Final    Comment: (NOTE) The Xpert Xpress SARS-CoV-2/FLU/RSV plus assay is intended as an aid in the diagnosis of influenza from Nasopharyngeal swab specimens  and should not be used as a sole basis for treatment. Nasal washings and aspirates are unacceptable for Xpert Xpress SARS-CoV-2/FLU/RSV testing.  Fact Sheet for Patients: EntrepreneurPulse.com.au  Fact Sheet for Healthcare Providers: IncredibleEmployment.be  This test is not yet approved or cleared by the [place] FDA and has been authorized for detection and/or diagnosis of SARS-CoV-2 by FDA under an Emergency Use Authorization (EUA). This EUA will remain in effect (meaning this test can be used) for the duration of the COVID-19 declaration under Section 564(b)(1) of the Act, 21 U.S.C. section 360bbb-3(b)(1), unless the authorization is terminated or revoked.  Performed at [facility], [place] [address], [place], [place] [address]      Labs: BNP (last 3 results) No results for input(s): BNP in the last 8760 hours. Basic Metabolic Panel: Recent Labs  Lab 08/20/20 0210 08/20/20 0410 08/21/20 0358 08/22/20 0319 08/23/20 0327  NA 139 140 137 139 137  K 4.2 4.2 4.2 4.4 4.2  CL 106 108 104 105 104  CO2 22 22 27 26 27   GLUCOSE 100* 115* 105* 88 88  BUN 30* 30* 27* 23 20  CREATININE 2.23* 2.07* 1.37* 1.12* 1.06*  CALCIUM 8.4* 8.6* 8.5* 8.7* 8.8*  MG  --   --  1.8  --  1.7  PHOS  --   --  3.8  --  3.7   Liver Function Tests: Recent Labs  Lab 08/21/20 0358  ALBUMIN 3.2*   No results for input(s): LIPASE, AMYLASE in the last 168 hours. No results for input(s): AMMONIA in the last 168 hours. CBC: Recent Labs  Lab 08/20/20 0210 08/20/20 0410 08/21/20 0358 08/23/20 0327  WBC 8.2 7.9 5.7 5.5  HGB 11.3* 11.2* 9.8* 10.9*  HCT 36.5 35.5* 31.7* 35.1*  MCV 93.8 89.2 91.9 91.4  PLT 165 192 201 213   Cardiac Enzymes: Recent Labs  Lab 08/20/20 0452  CKTOTAL 223   BNP: Invalid input(s): POCBNP CBG: No results for input(s): GLUCAP in the last 168 hours. D-Dimer No results for input(s): DDIMER in the last 72  hours. Hgb A1c No results for input(s): HGBA1C in the last 72 hours. Lipid Profile No results for input(s): CHOL, HDL, LDLCALC, TRIG, CHOLHDL, LDLDIRECT in the last 72 hours. Thyroid function studies No results for input(s): TSH, T4TOTAL, T3FREE, THYROIDAB in the last 72 hours.  Invalid input(s): FREET3 Anemia work up Recent Labs    08/21/20 0358  VITAMINB12 877  FOLATE 41.4  FERRITIN 34  TIBC 296  IRON 39  RETICCTPCT 1.2   Urinalysis    Component Value Date/Time   COLORURINE YELLOW 04/09/2018 [place] 04/09/2018 1549   LABSPEC 1.018 04/09/2018 1549   PHURINE 6.5 04/09/2018 1549  GLUCOSEU NEGATIVE 04/09/2018 1549   HGBUR NEGATIVE 04/09/2018 1549   BILIRUBINUR neg 09/04/2016 1107   KETONESUR NEGATIVE 04/09/2018 1549   PROTEINUR NEGATIVE 04/09/2018 1549   UROBILINOGEN 0.2 09/04/2016 1107   UROBILINOGEN 0.2 05/05/2013 1322   NITRITE neg 09/04/2016 1107   NITRITE NEGATIVE 03/30/2016 1245   LEUKOCYTESUR Small (1+) (A) 09/04/2016 1107   Sepsis Labs Invalid input(s): PROCALCITONIN,  WBC,  LACTICIDVEN Microbiology Recent Results (from the past 240 hour(s))  Resp Panel by RT-PCR (Flu A&B, Covid) Nasopharyngeal Swab     Status: None   Collection Time: 08/20/20  3:16 AM   Specimen: Nasopharyngeal Swab; Nasopharyngeal(NP) swabs in vial transport medium  Result Value Ref Range Status   SARS Coronavirus 2 by RT PCR NEGATIVE NEGATIVE Final    Comment: (NOTE) SARS-CoV-2 target nucleic acids are NOT DETECTED.  The SARS-CoV-2 RNA is generally detectable in upper respiratory specimens during the acute phase of infection. The lowest concentration of SARS-CoV-2 viral copies this assay can detect is 138 copies/mL. A negative result does not preclude SARS-Cov-2 infection and should not be used as the sole basis for treatment or other patient management decisions. A negative result may occur with  improper specimen collection/handling, submission of specimen  other than nasopharyngeal swab, presence of viral mutation(s) within the areas targeted by this assay, and inadequate number of viral copies(<138 copies/mL). A negative result must be combined with clinical observations, patient history, and epidemiological information. The expected result is Negative.  Fact Sheet for Patients:  EntrepreneurPulse.com.au  Fact Sheet for Healthcare Providers:  IncredibleEmployment.be  This test is no t yet approved or cleared by the [place] FDA and  has been authorized for detection and/or diagnosis of SARS-CoV-2 by FDA under an Emergency Use Authorization (EUA). This EUA will remain  in effect (meaning this test can be used) for the duration of the COVID-19 declaration under Section 564(b)(1) of the Act, 21 U.S.C.section 360bbb-3(b)(1), unless the authorization is terminated  or revoked sooner.       Influenza A by PCR NEGATIVE NEGATIVE Final   Influenza B by PCR NEGATIVE NEGATIVE Final    Comment: (NOTE) The Xpert Xpress SARS-CoV-2/FLU/RSV plus assay is intended as an aid in the diagnosis of influenza from Nasopharyngeal swab specimens and should not be used as a sole basis for treatment. Nasal washings and aspirates are unacceptable for Xpert Xpress SARS-CoV-2/FLU/RSV testing.  Fact Sheet for Patients: EntrepreneurPulse.com.au  Fact Sheet for Healthcare Providers: IncredibleEmployment.be  This test is not yet approved or cleared by the [place] FDA and has been authorized for detection and/or diagnosis of SARS-CoV-2 by FDA under an Emergency Use Authorization (EUA). This EUA will remain in effect (meaning this test can be used) for the duration of the COVID-19 declaration under Section 564(b)(1) of the Act, 21 U.S.C. section 360bbb-3(b)(1), unless the authorization is terminated or revoked.  Performed at [facility], [place] [address] [place], [place] [address]   Resp Panel by RT-PCR (Flu A&B, Covid) Nasopharyngeal Swab     Status: None   Collection Time: 08/22/20  3:03 PM   Specimen: Nasopharyngeal Swab; Nasopharyngeal(NP) swabs in vial transport medium  Result Value Ref Range Status   SARS Coronavirus 2 by RT PCR NEGATIVE NEGATIVE Final    Comment: (NOTE) SARS-CoV-2 target nucleic acids are NOT DETECTED.  The SARS-CoV-2 RNA is generally detectable in upper respiratory specimens during the acute phase of infection. The lowest concentration of SARS-CoV-2 viral copies this assay can detect is 138  copies/mL. A negative result does not preclude SARS-Cov-2 infection and should not be used as the sole basis for treatment or other patient management decisions. A negative result may occur with  improper specimen collection/handling, submission of specimen other than nasopharyngeal swab, presence of viral mutation(s) within the areas targeted by this assay, and inadequate number of viral copies(<138 copies/mL). A negative result must be combined with clinical observations, patient history, and epidemiological information. The expected result is Negative.  Fact Sheet for Patients:  EntrepreneurPulse.com.au  Fact Sheet for Healthcare Providers:  IncredibleEmployment.be  This test is no t yet approved or cleared by the [place] FDA and  has been authorized for detection and/or diagnosis of SARS-CoV-2 by FDA under an Emergency Use Authorization (EUA). This EUA will remain  in effect (meaning this test can be used) for the duration of the COVID-19 declaration under Section 564(b)(1) of the Act, 21 U.S.C.section 360bbb-3(b)(1), unless the authorization is terminated  or revoked sooner.       Influenza A by PCR NEGATIVE NEGATIVE Final   Influenza B by PCR NEGATIVE NEGATIVE Final    Comment: (NOTE) The Xpert Xpress SARS-CoV-2/FLU/RSV plus assay is intended as an aid in the  diagnosis of influenza from Nasopharyngeal swab specimens and should not be used as a sole basis for treatment. Nasal washings and aspirates are unacceptable for Xpert Xpress SARS-CoV-2/FLU/RSV testing.  Fact Sheet for Patients: EntrepreneurPulse.com.au  Fact Sheet for Healthcare Providers: IncredibleEmployment.be  This test is not yet approved or cleared by the [place] FDA and has been authorized for detection and/or diagnosis of SARS-CoV-2 by FDA under an Emergency Use Authorization (EUA). This EUA will remain in effect (meaning this test can be used) for the duration of the COVID-19 declaration under Section 564(b)(1) of the Act, 21 U.S.C. section 360bbb-3(b)(1), unless the authorization is terminated or revoked.  Performed at [facility], [place] [address] [place], [place] [address]      Time coordinating discharge: Over 30 minutes  SIGNED:   Shawna Clamp, MD  [facility] 08/23/2020, 11:30 AM   If 7PM-7AM, please contact night-coverage [email or web address]

## 2020-08-23 NOTE — Discharge Instructions (Signed)
Advised to continue current medications as prescribed. 

## 2020-08-23 NOTE — TOC Transition Note (Signed)
Transition of Care [facility]) - CM/SW Discharge Note  Patient Details  Name: Sandra Brennan MRN: [id number] Date of Birth: [birth year]  [facility] CM/SW Contact:  Sherie Don, LCSW Phone Number: 08/23/2020, 11:44 AM  Clinical Narrative: COVID test is negative. Discharge summary, discharge orders, and SNF transfer report faxed to [facility] in hub. Patient will go to room 122B and the number for report is [phone]. Medical necessity form done; PTAR scheduled. Discharge packet complete. CSW updated RN. [facility] to follow patient for [facility] at the facility. TOC signing off.  Final next level of care: Skilled Nursing Facility Barriers to Discharge: Barriers Resolved  Patient Goals and CMS Choice Patient states their goals for this hospitalization and ongoing recovery are:: Go to [facility] for SNF CMS Medicare.gov Compare Post Acute Care list provided to:: Patient Choice offered to / list presented to : Patient  Discharge Placement Existing PASRR number confirmed : 08/22/20          Patient chooses bed at: [facility] Patient to be transferred to facility by: PTAR Patient and family notified of of transfer: 08/23/20  Discharge Plan and Services In-house Referral: Clinical Social Work Post Acute Care Choice: Museum/gallery conservator, Home Health          DME Arranged: Gilford Rile rolling DME Agency: [employer] Date DME Agency Contacted: 08/21/20 Time DME Agency Contacted: 1107 Representative spoke with at DME Agency: Freda Munro HH Arranged: PT, OT [facility] Agency: [place] Date [place]: 08/21/20 Time [place]: 20 Representative spoke with at [place]: Amy  Readmission Risk Interventions No flowsheet data found.

## 2020-08-24 ENCOUNTER — Ambulatory Visit: Payer: Medicare Other | Admitting: Nurse Practitioner

## 2020-08-27 DIAGNOSIS — E1140 Type 2 diabetes mellitus with diabetic neuropathy, unspecified: Secondary | ICD-10-CM | POA: Diagnosis not present

## 2020-08-27 DIAGNOSIS — J449 Chronic obstructive pulmonary disease, unspecified: Secondary | ICD-10-CM | POA: Diagnosis not present

## 2020-08-27 DIAGNOSIS — I10 Essential (primary) hypertension: Secondary | ICD-10-CM | POA: Diagnosis not present

## 2020-08-30 ENCOUNTER — Telehealth: Payer: Self-pay | Admitting: *Deleted

## 2020-08-30 DIAGNOSIS — N179 Acute kidney failure, unspecified: Secondary | ICD-10-CM | POA: Diagnosis not present

## 2020-08-30 DIAGNOSIS — I10 Essential (primary) hypertension: Secondary | ICD-10-CM | POA: Diagnosis not present

## 2020-08-30 DIAGNOSIS — M5441 Lumbago with sciatica, right side: Secondary | ICD-10-CM | POA: Diagnosis not present

## 2020-08-30 NOTE — Chronic Care Management (AMB) (Signed)
  Chronic Care Management   Outreach Note  08/30/2020 Name: [name] MRN: [id number] DOB: 01-14-1946  Sandra Brennan is a 75 y.o. year old female who is a primary care patient of Nche, Charlene Brooke, NP. I reached out to [place] by phone today in response to a referral sent by Ms. Sandra Camps Sakai's PCP Nche, Charlene Brooke, NP     An unsuccessful telephone outreach was attempted today. The patient was referred to the case management team for assistance with care management and care coordination.   Follow Up Plan: A HIPAA compliant phone message was left for the patient providing contact information and requesting a return call.  If patient returns call to provider office, please advise to call Embedded Care Management Care Guide Dyan Labarbera at [place], [place] Management  Direct Dial: [phone]

## 2020-08-31 DIAGNOSIS — M48061 Spinal stenosis, lumbar region without neurogenic claudication: Secondary | ICD-10-CM | POA: Diagnosis not present

## 2020-08-31 DIAGNOSIS — R2689 Other abnormalities of gait and mobility: Secondary | ICD-10-CM | POA: Diagnosis not present

## 2020-08-31 DIAGNOSIS — E1142 Type 2 diabetes mellitus with diabetic polyneuropathy: Secondary | ICD-10-CM | POA: Diagnosis not present

## 2020-08-31 DIAGNOSIS — M160 Bilateral primary osteoarthritis of hip: Secondary | ICD-10-CM | POA: Diagnosis not present

## 2020-08-31 DIAGNOSIS — J449 Chronic obstructive pulmonary disease, unspecified: Secondary | ICD-10-CM | POA: Diagnosis not present

## 2020-08-31 DIAGNOSIS — M6281 Muscle weakness (generalized): Secondary | ICD-10-CM | POA: Diagnosis not present

## 2020-08-31 DIAGNOSIS — I10 Essential (primary) hypertension: Secondary | ICD-10-CM | POA: Diagnosis not present

## 2020-08-31 NOTE — Chronic Care Management (AMB) (Signed)
  Chronic Care Management   Note  08/31/2020 Name: Sandra Brennan MRN: [id number] DOB: 1945/04/21  Sandra Brennan is a 75 y.o. year old female who is a primary care patient of Nche, Charlene Brooke, NP. I reached out to [place] by phone today in response to a referral sent by [name]'s PCP Nche, Charlene Brooke, NP     Sandra Brennan was given information about Chronic Care Management services today including:  CCM service includes personalized support from designated clinical staff supervised by her physician, including individualized plan of care and coordination with other care providers 24/7 contact phone numbers for assistance for urgent and routine care needs. Service will only be billed when office clinical staff spend 20 minutes or more in a month to coordinate care. Only one practitioner may furnish and bill the service in a calendar month. The patient may stop CCM services at any time (effective at the end of the month) by phone call to the office staff. The patient will be responsible for cost sharing (co-pay) of up to 20% of the service fee (after annual deductible is met).  Patient did not agree to enrollment in care management services and does not wish to consider at this time.  Follow up plan: Patient declines further follow up and engagement by the care management team. Appropriate care team members and provider have been notified via electronic communication.  The care management team is available to follow up with the patient after provider conversation with the patient regarding recommendation for care management engagement and subsequent re-referral to the care management team.  Pt has appt with annual medicare wellness on 09/04/20 and declines CCM RNCM appt at this time   Julian Hy, [place], [place] Management  Direct Dial: [phone]

## 2020-09-04 ENCOUNTER — Ambulatory Visit: Payer: Medicare Other

## 2020-09-04 ENCOUNTER — Telehealth: Payer: Self-pay | Admitting: Neurology

## 2020-09-04 NOTE — Telephone Encounter (Signed)
I spoke to the patient and reviewed Sarah's message with her. States her headaches have improved some. Nurtec is helpful. She was in agreement to come for repeat labs in a few weeks.

## 2020-09-04 NOTE — Telephone Encounter (Addendum)
Got a reminder to recheck labs, looks like recent hospitalization for post-fall with multiple orthopedic pain locations after dresser fell on her. Had AKI, admitted for AKI and pain control. D/c 6/23 to [facility]. We may call to check on her headaches, we should check repeat labs but okay to wait a few weeks if headaches are doing well, as this hospitalization may skew the labs anyhow.   ----- Message from Suzzanne Cloud, NP sent at 07/24/2020  7:50 AM EDT ----- Check esr, crp, place orders

## 2020-09-10 ENCOUNTER — Ambulatory Visit (INDEPENDENT_AMBULATORY_CARE_PROVIDER_SITE_OTHER): Payer: Medicare Other | Admitting: Family Medicine

## 2020-09-10 ENCOUNTER — Encounter: Payer: Self-pay | Admitting: Family Medicine

## 2020-09-10 ENCOUNTER — Other Ambulatory Visit: Payer: Self-pay

## 2020-09-10 VITALS — BP 124/70 | HR 75 | Temp 97.2°F | Ht 59.0 in | Wt 287.0 lb

## 2020-09-10 DIAGNOSIS — D649 Anemia, unspecified: Secondary | ICD-10-CM | POA: Diagnosis not present

## 2020-09-10 DIAGNOSIS — W19XXXD Unspecified fall, subsequent encounter: Secondary | ICD-10-CM | POA: Diagnosis not present

## 2020-09-10 DIAGNOSIS — N179 Acute kidney failure, unspecified: Secondary | ICD-10-CM

## 2020-09-10 LAB — COMPREHENSIVE METABOLIC PANEL
ALT: 12 U/L (ref 0–35)
AST: 19 U/L (ref 0–37)
Albumin: 3.7 g/dL (ref 3.5–5.2)
Alkaline Phosphatase: 83 U/L (ref 39–117)
BUN: 18 mg/dL (ref 6–23)
CO2: 29 mEq/L (ref 19–32)
Calcium: 9.2 mg/dL (ref 8.4–10.5)
Chloride: 104 mEq/L (ref 96–112)
Creatinine, Ser: 1.31 mg/dL — ABNORMAL HIGH (ref 0.40–1.20)
GFR: 40.49 mL/min — ABNORMAL LOW (ref >60.00)
Glucose, Bld: 93 mg/dL (ref 70–99)
Potassium: 4.6 mEq/L (ref 3.5–5.1)
Sodium: 141 mEq/L (ref 135–145)
Total Bilirubin: 0.4 mg/dL (ref 0.2–1.2)
Total Protein: 6.7 g/dL (ref 6.0–8.3)

## 2020-09-10 LAB — CBC
HCT: 34.2 % — ABNORMAL LOW (ref 36.0–46.0)
Hemoglobin: 11.1 g/dL — ABNORMAL LOW (ref 12.0–15.0)
MCHC: 32.3 g/dL (ref 30.0–36.0)
MCV: 87.5 fl (ref 78.0–100.0)
Platelets: 212 10*3/uL (ref 150.0–400.0)
RBC: 3.91 Mil/uL (ref 3.87–5.11)
RDW: 15 % (ref 11.5–15.5)
WBC: 6.6 10*3/uL (ref 4.0–10.5)

## 2020-09-10 NOTE — Progress Notes (Signed)
[facility] [address] [place] [address] Dept: [phone] Dept Fax: [phone]  Office Visit  Subjective:    Patient ID: Sandra Brennan, female    DOB: 26-Apr-1945, 75 y.o..   MRN: [id number]  Chief Complaint  Patient presents with   Hospitalization [facility] f/u from 09/02/20 for falling while climbing on a nightstand.  Still having LT ankle pain.     History of Present Illness:  Patient is in today for a hospital follow-up. Ms. Huffstetler was admitted to [facility] from 6/20-6/23. She had presented to the ED for evaluation of injuries related to falling from a dresser top. She had climbed up onto the dresser to fix a curtain rod. She had requested the maintenance staff for her apartments to address this, but they had been slow to respond. Her ER evaluation did not find her to have any acute fractures or head trauma. However, she was found to have an acute increase in her renal function. Her initial BUN/Cr were 30/2.23. She was also found to be mildly anemic. She was treated with IV fluids.  Ms. Saez notes that she feels at her baseline. She has chronic pain related to her back, knees, and an old ankle injury. She currently uses a walker as she had had multiple falls in the past. She did work with PT for a time and can return when she needs to for ongoing strengthening and balance concerns.  Ms. Juncaj notes that she is often dehydrated, as she is allergic to certain minerals in water from various sources. These cause her to break out in rashes. She notes that she is able to drinking [employer] and [employer] brands of water.  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 requiring isolated polyethylene liner exchange ([place]) 07/12/2019   Depression, recurrent ([place]) 07/12/2018   Migraine without aura and without status migrainosus, not intractable 09/10/2017   Post-traumatic osteoarthritis of right ankle 06/09/2016   Spondylosis without myelopathy or radiculopathy, lumbar region 06/09/2016   Cervical spinal stenosis 06/09/2016   Spinal stenosis, lumbar region, without neurogenic claudication 06/09/2016   Chronic gouty arthritis 06/04/2016   Vitamin D deficiency 06/02/2016   Hyperlipidemia 06/02/2016   Closed right ankle fracture 05/01/2016   Colon polyps 05/01/2016   Fibromyalgia syndrome 05/01/2016   Syncope 03/30/2016   Chest pain 03/30/2016   Fall 03/30/2016   GERD with esophagitis 06/21/2015   Pharyngeal dysphagia 06/21/2015   Obesity 05/06/2013   Acute nontraumatic kidney injury ([place]) 05/05/2013   Acute posthemorrhagic anemia 09/06/2012   Gout 09/05/2012   Osteoarthritis of left hip 08/05/2012   Type 2 diabetes mellitus with diabetic polyneuropathy, without long-term current use of insulin ([place]) 08/05/2012   Hypertension 08/05/2012   Asthma, chronic 08/05/2012   Sleep apnea 08/05/2012   Past Surgical History:  Procedure Laterality Date   ABDOMINAL HYSTERECTOMY  1990's   GANGLION CYST EXCISION Right 1980's   "wrist" (08/03/2012)   JOINT REPLACEMENT     KNEE LIGAMENT RECONSTRUCTION Right 1980's   TONSILLECTOMY  ~ [place] Left 08/03/2012   TOTAL HIP ARTHROPLASTY Left 08/03/2012   Procedure: TOTAL HIP ARTHROPLASTY;  Surgeon: Garald Balding, MD;  Location: [facility];  Service: Orthopedics;  Laterality: Left;  Left Total Hip Arthroplasty   TOTAL HIP ARTHROPLASTY Left 08/28/2012   Procedure: Irrigation and Debridement hip ;  Surgeon: Mcarthur Rossetti, MD;  Location: [facility];  Service: Orthopedics;  Laterality: Left;   TOTAL KNEE ARTHROPLASTY Left 2001   TOTAL KNEE ARTHROPLASTY Right 2004   Family History  Problem  Relation Age of Onset   Arthritis Mother    Arthritis Father    Colon cancer Father    Diabetes Sister    Arthritis Sister    Diabetes Maternal Grandmother    Arthritis Maternal Grandmother    Diabetes Maternal Grandfather    Arthritis Maternal Grandfather     Outpatient Medications Prior to Visit  Medication Sig Dispense Refill   albuterol (VENTOLIN HFA) 108 (90 Base) MCG/ACT inhaler INHALE 1-2 PUFFS INTO THE LUNGS EVERY 6 (SIX) HOURS AS NEEDED FOR WHEEZING OR SHORTNESS OF BREATH 6.7 each 0   allopurinol (ZYLOPRIM) 100 MG tablet TAKE 1 TABLET BY MOUTH EVERY DAY 90 tablet 3   aspirin EC 81 MG tablet Take 81 mg by mouth daily.     atorvastatin (LIPITOR) 10 MG tablet TAKE 1 TABLET (10 MG TOTAL) BY MOUTH DAILY AT 6 PM. 90 tablet 3   Carboxymethylcellulose Sodium (EYE DROPS OP) Apply to eye. Lubricant eye drops     cetirizine (ZYRTEC) 10 MG tablet Take 10 mg by mouth daily as needed.     cholecalciferol (VITAMIN D3) 25 MCG (1000 UNIT) tablet Take 1,000 Units by mouth daily.     cycloSPORINE (RESTASIS) 0.05 % ophthalmic emulsion Restasis 0.05 % eye drops in a dropperette     lisinopril-hydrochlorothiazide (ZESTORETIC) 20-12.5 MG tablet Take 1 tablet by mouth daily. 90 tablet 1   Multiple Vitamin (MULTIVITAMIN) tablet Take 1 tablet by mouth daily.     omeprazole (PRILOSEC) 20 MG capsule TAKE 1 CAPSULE BY MOUTH EVERY DAY 90 capsule 1   OVER THE COUNTER MEDICATION Cystic magnesium     pregabalin (LYRICA) 150 MG capsule Take 1 capsule (150 mg total) by mouth 2 (two) times daily. 60 capsule 5   Rimegepant Sulfate (NURTEC) 75 MG TBDP Take 75 mg by mouth as needed (take 1 at onset of headache, max is 75 in 24 hours). 10 tablet 11   Sennosides 8.6 MG CAPS Take 1 capsule by mouth daily.     SYMBICORT 160-4.5 MCG/ACT inhaler INHALE 2 PUFFS INTO THE LUNGS IN THE MORNING AND AT BEDTIME. RINSE MOUTH AFTER EACH USE 30.6 Inhaler 7   traMADol (ULTRAM) 50 MG tablet Take 1 tablet (50 mg total) by mouth 3  (three) times daily. 90 tablet 5   No facility-administered medications prior to visit.   Allergies  Allergen Reactions   Cymbalta [Duloxetine Hcl] Anaphylaxis   Pineapple Shortness Of Breath   Sulfa Antibiotics Hives, Shortness Of Breath and Itching    REACTION: unknown   Influenza Vaccines Hives and Itching   Penicillins Hives   Theophyllines Hives   Objective:   Today's Vitals   09/10/20 1133  BP: 124/70  Pulse: 75  Temp: (!) 97.2 F (36.2 C)  TempSrc: Temporal  SpO2: 93%  Weight: 287 lb (130.2 kg)  Height: 4\' 11"  (1.499 m)   Body mass index is 57.97 kg/m.   General: Well developed, well nourished. No acute distress. Psych: Alert and oriented. Normal mood and affect.  Health Maintenance Due  Topic Date Due   TETANUS/TDAP  Never done   Zoster Vaccines-  Shingrix (1 of 2) Never done   OPHTHALMOLOGY EXAM  05/12/2019   COVID-19 Vaccine (3 - Moderna risk series) 07/08/2019   FOOT EXAM  08/22/2020   Lab Results BMP Latest Ref Rng & Units 08/23/2020 08/22/2020 08/21/2020  Glucose 70 - 99 mg/dL 88 88 105(H)  BUN 8 - 23 mg/dL 20 23 27(H)  Creatinine 0.44 - 1.00 mg/dL 1.06(H) 1.12(H) 1.37(H)  BUN/Creat Ratio 6 - 22 (calc) - - -  Sodium 135 - 145 mmol/L 137 139 137  Potassium 3.5 - 5.1 mmol/L 4.2 4.4 4.2  Chloride 98 - 111 mmol/L 104 105 104  CO2 22 - 32 mmol/L 27 26 27   Calcium 8.9 - 10.3 mg/dL 8.8(L) 8.7(L) 8.5(L)   Lab Results  Component Value Date   WBC 5.5 08/23/2020   HGB 10.9 (L) 08/23/2020   HCT 35.1 (L) 08/23/2020   MCV 91.4 08/23/2020   PLT 213 08/23/2020     Assessment & Plan:   1. AKI (acute kidney injury) ([place]) I reviewed the discharge summary, multiple labs, and imaging results from her recent hospitalization.  We will check follow-up labs as recommended by the discharging physician. We did discuss about the importance for MS. Liebig to remain hydrated. I suggested she consider pricing for an in-home water cooler, as the cost may be less than buying  individual bottled waters.  - Comprehensive metabolic panel  2. Fall, subsequent encounter No ongoing issues from the fall. We did discuss the importance of her seeking assistance with home maintenance issues. She admits she has family that could have helped her with her curtain rod issue.  3. Normocytic anemia Appears to have chronic, mild anemia. There was some drop in her H/H int he hospital, likely due to dilution during IV hydration. We will reassess her CBC today. Her iron panel, B12 and folate were normal in the hospital. Her reticulocyte count was in the normal range, not elevated as might be expected. She may need a colonoscopy and/or EGD to evaluate for GI blood loss.  - CBC  Haydee Salter, MD

## 2020-09-19 ENCOUNTER — Ambulatory Visit: Payer: Medicare Other | Admitting: Dietician

## 2020-10-01 ENCOUNTER — Telehealth: Payer: Self-pay

## 2020-10-01 NOTE — Telephone Encounter (Signed)
Wrong patient

## 2020-10-03 ENCOUNTER — Ambulatory Visit (INDEPENDENT_AMBULATORY_CARE_PROVIDER_SITE_OTHER): Payer: Medicare Other | Admitting: Nurse Practitioner

## 2020-10-03 ENCOUNTER — Encounter: Payer: Self-pay | Admitting: Nurse Practitioner

## 2020-10-03 ENCOUNTER — Other Ambulatory Visit: Payer: Self-pay

## 2020-10-03 VITALS — BP 130/74 | HR 80 | Temp 97.7°F | Wt 288.8 lb

## 2020-10-03 DIAGNOSIS — E782 Mixed hyperlipidemia: Secondary | ICD-10-CM

## 2020-10-03 DIAGNOSIS — E1142 Type 2 diabetes mellitus with diabetic polyneuropathy: Secondary | ICD-10-CM | POA: Diagnosis not present

## 2020-10-03 DIAGNOSIS — I10 Essential (primary) hypertension: Secondary | ICD-10-CM | POA: Diagnosis not present

## 2020-10-03 DIAGNOSIS — F339 Major depressive disorder, recurrent, unspecified: Secondary | ICD-10-CM

## 2020-10-03 DIAGNOSIS — N1831 Chronic kidney disease, stage 3a: Secondary | ICD-10-CM

## 2020-10-03 DIAGNOSIS — M1A9XX0 Chronic gout, unspecified, without tophus (tophi): Secondary | ICD-10-CM

## 2020-10-03 DIAGNOSIS — J4520 Mild intermittent asthma, uncomplicated: Secondary | ICD-10-CM

## 2020-10-03 DIAGNOSIS — M1A00X0 Idiopathic chronic gout, unspecified site, without tophus (tophi): Secondary | ICD-10-CM

## 2020-10-03 LAB — POCT GLYCOSYLATED HEMOGLOBIN (HGB A1C)
HbA1c POC (<> result, manual entry): 6.4 % (ref 4.0–5.6)
Hemoglobin A1C: 6.4 % — AB (ref 4.0–5.6)

## 2020-10-03 MED ORDER — SYMBICORT 160-4.5 MCG/ACT IN AERO: 2.0000 | INHALATION_SPRAY | Freq: Two times a day (BID) | RESPIRATORY_TRACT | 5 refills | Status: DC

## 2020-10-03 MED ORDER — LISINOPRIL 20 MG PO TABS: 20.0000 mg | ORAL_TABLET | Freq: Every day | ORAL | 3 refills | Status: DC

## 2020-10-03 MED ORDER — ALLOPURINOL 100 MG PO TABS: 100.0000 mg | ORAL_TABLET | Freq: Every day | ORAL | 1 refills | Status: DC

## 2020-10-03 MED ORDER — ATORVASTATIN CALCIUM 10 MG PO TABS: 10.0000 mg | ORAL_TABLET | Freq: Every day | ORAL | 3 refills | Status: DC

## 2020-10-03 NOTE — Assessment & Plan Note (Addendum)
Controlled with HgbA1c at 6.4%. No medication needed at this time advised to schedule appt for annual DM eye exam. Chronic neuropathy: DM vs spinal stenosis? Current use of lyrica, also advised about importance of foot wear at all times

## 2020-10-03 NOTE — Progress Notes (Signed)
Subjective:  Patient ID: Sandra Brennan, female    DOB: 03-11-1945  Age: 75 y.o. MRN: [id number]  CC: Follow-up (3 week f/u on DM, HTN, GERD, and hyperlipidemia. /Pt states she is still having issues walking that she thinks is due to arthritis, states at times her whole body hurts and its difficult to walk. / )  HPI Chronic Pain management per Danella Sensing with [facility]. Current use of tramadol and lyrica.  Hypertension BP at goal with lisinopril/HCTZ BP Readings from Last 3 Encounters:  10/03/20 130/74  09/10/20 124/70  08/23/20 (!) 179/84   Echocardiogram done 2018: Normal LV size and systolic function, EF XX123456. RV mildly dilated  with normal systolic function. No significant valvular abnormalities.   Change current med to lisinopril due to decline in renal function Advised about maintain adequate oral hydration Use furosemide '20mg'$  as needed to manage LE edema.  Type 2 diabetes mellitus with diabetic polyneuropathy, without long-term current use of insulin (HCC) Controlled with HgbA1c at 6.4%. No medication needed at this time advised to schedule appt for annual DM eye exam. Chronic neuropathy: DM vs spinal stenosis? Current use of lyrica, also advised about importance of foot wear at all times  CKD (chronic kidney disease) stage 3, GFR 30-59 ml/min (HCC) Improved renal function with hydration. Negative phosphorus, normal electrolytes D/c HCTZ Use furosemide prn to manage LE edema Controlled Dm and HTN  Positive anemia with normal iron panel, B12 and folate. BMP Latest Ref Rng & Units 09/10/2020 08/23/2020 08/22/2020  Glucose 70 - 99 mg/dL 93 88 88  BUN 6 - 23 mg/dL '18 20 23  '$ Creatinine 0.40 - 1.20 mg/dL 1.31(H) 1.06(H) 1.12(H)  BUN/Creat Ratio 6 - 22 (calc) - - -  Sodium 135 - 145 mEq/L 141 137 139  Potassium 3.5 - 5.1 mEq/L 4.6 4.2 4.4  Chloride 96 - 112 mEq/L 104 104 105  CO2 19 - 32 mEq/L '29 27 26  '$ Calcium 8.4 - 10.5 mg/dL 9.2 8.8(L)  8.7(L)    Depression, recurrent (HCC) She continues to struggle with depressed mood and maintaining frequent meal. She had 1appt with nutritionist, but did not schedule f/up. Reports she has implemented some suggestions. She continues to decline need for medication and appt with psychologist.  BP Readings from Last 3 Encounters:  10/03/20 130/74  09/10/20 124/70  08/23/20 (!) 179/84    Wt Readings from Last 3 Encounters:  10/03/20 288 lb 12.8 oz (131 kg)  09/10/20 287 lb (130.2 kg)  08/19/20 299 lb 6.4 oz (135.8 kg)    Depression screen [facility] 2/9 10/03/2020 08/07/2020 08/02/2020  Decreased Interest 1 0 1  Down, Depressed, Hopeless 0 0 1  PHQ - 2 Score 1 0 2  Altered sleeping 3 - -  Tired, decreased energy 0 - -  Change in appetite 3 - -  Feeling bad or failure about yourself  0 - -  Trouble concentrating 0 - -  Moving slowly or fidgety/restless 2 - -  Suicidal thoughts 0 - -  PHQ-9 Score 9 - -  Difficult doing work/chores Somewhat difficult - -  Some recent data might be hidden    GAD 7 : Generalized Anxiety Score 10/03/2020 07/12/2018 04/09/2018  Nervous, Anxious, on Edge 0 1 2  Control/stop worrying 0 3 3  Worry too much - different things 0 3 2  Trouble relaxing '3 3 3  '$ Restless 0 1 0  Easily annoyed or irritable '1 1 2  '$ Afraid -  awful might happen 1 3 0  Total GAD 7 Score '5 15 12  '$ Anxiety Difficulty Not difficult at all - -   Reviewed past Medical, Social and Family history today.  Outpatient Medications Prior to Visit  Medication Sig Dispense Refill   albuterol (VENTOLIN HFA) 108 (90 Base) MCG/ACT inhaler INHALE 1-2 PUFFS INTO THE LUNGS EVERY 6 (SIX) HOURS AS NEEDED FOR WHEEZING OR SHORTNESS OF BREATH 6.7 each 0   aspirin EC 81 MG tablet Take 81 mg by mouth daily.     Carboxymethylcellulose Sodium (EYE DROPS OP) Apply to eye. Lubricant eye drops     cetirizine (ZYRTEC) 10 MG tablet Take 10 mg by mouth daily as needed.     cholecalciferol (VITAMIN D3) 25 MCG (1000 UNIT)  tablet Take 1,000 Units by mouth daily.     cycloSPORINE (RESTASIS) 0.05 % ophthalmic emulsion Restasis 0.05 % eye drops in a dropperette     Multiple Vitamin (MULTIVITAMIN) tablet Take 1 tablet by mouth daily.     omeprazole (PRILOSEC) 20 MG capsule TAKE 1 CAPSULE BY MOUTH EVERY DAY 90 capsule 1   OVER THE COUNTER MEDICATION Cystic magnesium     pregabalin (LYRICA) 150 MG capsule Take 1 capsule (150 mg total) by mouth 2 (two) times daily. 60 capsule 5   Rimegepant Sulfate (NURTEC) 75 MG TBDP Take 75 mg by mouth as needed (take 1 at onset of headache, max is 75 in 24 hours). 10 tablet 11   Sennosides 8.6 MG CAPS Take 1 capsule by mouth daily.     traMADol (ULTRAM) 50 MG tablet Take 1 tablet (50 mg total) by mouth 3 (three) times daily. 90 tablet 5   allopurinol (ZYLOPRIM) 100 MG tablet TAKE 1 TABLET BY MOUTH EVERY DAY 90 tablet 3   atorvastatin (LIPITOR) 10 MG tablet TAKE 1 TABLET (10 MG TOTAL) BY MOUTH DAILY AT 6 PM. 90 tablet 3   lisinopril-hydrochlorothiazide (ZESTORETIC) 20-12.5 MG tablet Take 1 tablet by mouth daily. 90 tablet 1   SYMBICORT 160-4.5 MCG/ACT inhaler INHALE 2 PUFFS INTO THE LUNGS IN THE MORNING AND AT BEDTIME. RINSE MOUTH AFTER EACH USE 30.6 Inhaler 7   No facility-administered medications prior to visit.    ROS See HPI  Objective:  BP 130/74 (BP Location: Left Arm, Patient Position: Sitting, Cuff Size: Large)   Pulse 80   Temp 97.7 F (36.5 C) (Temporal)   Wt 288 lb 12.8 oz (131 kg)   SpO2 96%   BMI 58.33 kg/m   Physical Exam Cardiovascular:     Rate and Rhythm: Normal rate and regular rhythm.     Pulses: Normal pulses.     Heart sounds: Normal heart sounds.  Pulmonary:     Effort: Pulmonary effort is normal.     Breath sounds: Normal breath sounds.  Musculoskeletal:     Right lower leg: Edema present.     Left lower leg: Edema present.  Neurological:     Mental Status: She is alert and oriented to person, place, and time.  Psychiatric:        Mood and  Affect: Mood normal.        Behavior: Behavior normal.        Thought Content: Thought content normal.   Assessment & Plan:  This visit occurred during the SARS-CoV-2 public health emergency.  Safety protocols were in place, including screening questions prior to the visit, additional usage of staff PPE, and extensive cleaning of exam room while observing appropriate contact time  as indicated for disinfecting solutions.   Ryian was seen today for follow-up.  Diagnoses and all orders for this visit:  Type 2 diabetes mellitus with diabetic polyneuropathy, without long-term current use of insulin (HCC) -     POCT glycosylated hemoglobin (Hb A1C)  Primary hypertension -     lisinopril (ZESTRIL) 20 MG tablet; Take 1 tablet (20 mg total) by mouth daily.  Mild intermittent chronic asthma without complication -     SYMBICORT 160-4.5 MCG/ACT inhaler; Inhale 2 puffs into the lungs in the morning and at bedtime. Rinse mouth after each use  Chronic gouty arthritis -     allopurinol (ZYLOPRIM) 100 MG tablet; Take 1 tablet (100 mg total) by mouth daily.  Mixed hyperlipidemia -     atorvastatin (LIPITOR) 10 MG tablet; Take 1 tablet (10 mg total) by mouth daily at 6 PM.  Stage 3a chronic kidney disease (HCC)  Depression, recurrent ([place])   Problem List Items Addressed This Visit       Cardiovascular and Mediastinum   Hypertension (Chronic)    BP at goal with lisinopril/HCTZ BP Readings from Last 3 Encounters:  10/03/20 130/74  09/10/20 124/70  08/23/20 (!) 179/84   Echocardiogram done 2018: Normal LV size and systolic function, EF XX123456. RV mildly dilated  with normal systolic function. No significant valvular abnormalities.   Change current med to lisinopril due to decline in renal function Advised about maintain adequate oral hydration Use furosemide '20mg'$  as needed to manage LE edema.      Relevant Medications   atorvastatin (LIPITOR) 10 MG tablet   lisinopril (ZESTRIL) 20 MG  tablet     Respiratory   Asthma, chronic (Chronic)   Relevant Medications   SYMBICORT 160-4.5 MCG/ACT inhaler     Endocrine   Type 2 diabetes mellitus with diabetic polyneuropathy, without long-term current use of insulin (HCC) - Primary (Chronic)    Controlled with HgbA1c at 6.4%. No medication needed at this time advised to schedule appt for annual DM eye exam. Chronic neuropathy: DM vs spinal stenosis? Current use of lyrica, also advised about importance of foot wear at all times       Relevant Medications   atorvastatin (LIPITOR) 10 MG tablet   lisinopril (ZESTRIL) 20 MG tablet   Other Relevant Orders   POCT glycosylated hemoglobin (Hb A1C) (Completed)     Musculoskeletal and Integument   Chronic gouty arthritis   Relevant Medications   allopurinol (ZYLOPRIM) 100 MG tablet     Genitourinary   CKD (chronic kidney disease) stage 3, GFR 30-59 ml/min (HCC)    Improved renal function with hydration. Negative phosphorus, normal electrolytes D/c HCTZ Use furosemide prn to manage LE edema Controlled Dm and HTN  Positive anemia with normal iron panel, B12 and folate. BMP Latest Ref Rng & Units 09/10/2020 08/23/2020 08/22/2020  Glucose 70 - 99 mg/dL 93 88 88  BUN 6 - 23 mg/dL '18 20 23  '$ Creatinine 0.40 - 1.20 mg/dL 1.31(H) 1.06(H) 1.12(H)  BUN/Creat Ratio 6 - 22 (calc) - - -  Sodium 135 - 145 mEq/L 141 137 139  Potassium 3.5 - 5.1 mEq/L 4.6 4.2 4.4  Chloride 96 - 112 mEq/L 104 104 105  CO2 19 - 32 mEq/L '29 27 26  '$ Calcium 8.4 - 10.5 mg/dL 9.2 8.8(L) 8.7(L)          Other   Depression, recurrent (HCC)    She continues to struggle with depressed mood and maintaining frequent meal. She had 1appt  with nutritionist, but did not schedule f/up. Reports she has implemented some suggestions. She continues to decline need for medication and appt with psychologist.       Hyperlipidemia   Relevant Medications   atorvastatin (LIPITOR) 10 MG tablet   lisinopril (ZESTRIL) 20 MG  tablet    Follow-up: Return in about 3 months (around 01/03/2021) for DM and HTN, hyperlipidemia (fasting).  Wilfred Lacy, NP

## 2020-10-03 NOTE — Assessment & Plan Note (Signed)
She continues to struggle with depressed mood and maintaining frequent meal. She had 1appt with nutritionist, but did not schedule f/up. Reports she has implemented some suggestions. She continues to decline need for medication and appt with psychologist.

## 2020-10-03 NOTE — Assessment & Plan Note (Addendum)
BP at goal with lisinopril/HCTZ BP Readings from Last 3 Encounters:  10/03/20 130/74  09/10/20 124/70  08/23/20 (!) 179/84   Echocardiogram done 2018: Normal LV size and systolic function, EF XX123456. RV mildly dilated  with normal systolic function. No significant valvularabnormalities.   Change current med to lisinopril due to decline in renal function Advised about maintain adequate oral hydration Use furosemide '20mg'$  as needed to manage LE edema.

## 2020-10-03 NOTE — Assessment & Plan Note (Addendum)
Improved renal function with hydration. Negative phosphorus, normal electrolytes D/c HCTZ Use furosemide prn to manage LE edema Controlled Dm and HTN  Positive anemia with normal iron panel, B12 and folate. BMP Latest Ref Rng & Units 09/10/2020 08/23/2020 08/22/2020  Glucose 70 - 99 mg/dL 93 88 88  BUN 6 - 23 mg/dL '18 20 23  '$ Creatinine 0.40 - 1.20 mg/dL 1.31(H) 1.06(H) 1.12(H)  BUN/Creat Ratio 6 - 22 (calc) - - -  Sodium 135 - 145 mEq/L 141 137 139  Potassium 3.5 - 5.1 mEq/L 4.6 4.2 4.4  Chloride 96 - 112 mEq/L 104 104 105  CO2 19 - 32 mEq/L '29 27 26  '$ Calcium 8.4 - 10.5 mg/dL 9.2 8.8(L) 8.7(L)

## 2020-10-03 NOTE — Patient Instructions (Addendum)
Maintain current medications Schedule appt with Dr. Marcello Moores and PT.

## 2020-10-11 DIAGNOSIS — G4733 Obstructive sleep apnea (adult) (pediatric): Secondary | ICD-10-CM | POA: Diagnosis not present

## 2020-10-23 ENCOUNTER — Other Ambulatory Visit: Payer: Self-pay | Admitting: Nurse Practitioner

## 2020-10-23 DIAGNOSIS — I10 Essential (primary) hypertension: Secondary | ICD-10-CM

## 2020-10-23 DIAGNOSIS — J4520 Mild intermittent asthma, uncomplicated: Secondary | ICD-10-CM

## 2020-10-23 DIAGNOSIS — E559 Vitamin D deficiency, unspecified: Secondary | ICD-10-CM

## 2020-10-23 DIAGNOSIS — K2100 Gastro-esophageal reflux disease with esophagitis, without bleeding: Secondary | ICD-10-CM

## 2020-10-29 ENCOUNTER — Other Ambulatory Visit: Payer: Self-pay | Admitting: *Deleted

## 2020-10-29 ENCOUNTER — Telehealth: Payer: Self-pay

## 2020-10-29 MED ORDER — NURTEC 75 MG PO TBDP: 75.0000 mg | ORAL_TABLET | ORAL | 0 refills | Status: DC | PRN

## 2020-10-29 NOTE — Telephone Encounter (Signed)
Placed a call to Ms. Cronk, no answer. Left message to return the call. Placed a call to [facility] regarding the Tramadol and Lyrica prescription. The pharmacisit reports to have [facility] call them to transfer the prescription. [facility] was called regarding the above.

## 2020-10-29 NOTE — Telephone Encounter (Signed)
Request for Tramadol #90 LF 09/20/20 and Pregabalin #60 LF 10/20/20 from [facility]

## 2020-10-30 ENCOUNTER — Telehealth: Payer: Self-pay

## 2020-10-30 DIAGNOSIS — K2100 Gastro-esophageal reflux disease with esophagitis, without bleeding: Secondary | ICD-10-CM

## 2020-10-30 DIAGNOSIS — E782 Mixed hyperlipidemia: Secondary | ICD-10-CM

## 2020-10-30 DIAGNOSIS — I10 Essential (primary) hypertension: Secondary | ICD-10-CM

## 2020-10-30 DIAGNOSIS — M1A00X0 Idiopathic chronic gout, unspecified site, without tophus (tophi): Secondary | ICD-10-CM

## 2020-10-30 DIAGNOSIS — J4520 Mild intermittent asthma, uncomplicated: Secondary | ICD-10-CM

## 2020-10-30 MED ORDER — ALLOPURINOL 100 MG PO TABS: 100.0000 mg | ORAL_TABLET | Freq: Every day | ORAL | 1 refills | Status: DC

## 2020-10-30 MED ORDER — OMEPRAZOLE 20 MG PO CPDR: DELAYED_RELEASE_CAPSULE | ORAL | 1 refills | Status: DC

## 2020-10-30 MED ORDER — ATORVASTATIN CALCIUM 10 MG PO TABS: 10.0000 mg | ORAL_TABLET | Freq: Every day | ORAL | 1 refills | Status: DC

## 2020-10-30 MED ORDER — SYMBICORT 160-4.5 MCG/ACT IN AERO: 2.0000 | INHALATION_SPRAY | Freq: Two times a day (BID) | RESPIRATORY_TRACT | 5 refills | Status: DC

## 2020-10-30 MED ORDER — ALBUTEROL SULFATE HFA 108 (90 BASE) MCG/ACT IN AERS: INHALATION_SPRAY | RESPIRATORY_TRACT | 3 refills | Status: DC

## 2020-10-30 MED ORDER — LISINOPRIL 20 MG PO TABS
20.0000 mg | ORAL_TABLET | Freq: Every day | ORAL | 1 refills | Status: DC
Start: 2020-10-30 — End: 2021-04-05

## 2020-10-30 NOTE — Telephone Encounter (Signed)
Received a refill request from [facility]egin[facility] Ramirez for the following medications:  Allopurinol 100 mg LR 10/03/20, #90, 1 rf  (she picked up RX from [facility])  Albuterol HFA  LR  10/25/20, #1, 0 rf  (picked up from [facility])  Symbicort Inhaler LR  10/03/20, # 1, 5 rf  (picked up from [facility])  Omeprazole 20 mg LR 10/25/20, #90, 1 rf  (didn't pickup)  Lisinopril 20 mg LR 10/03/20, #90, 3 rf (picked up at [facility])  Atorvastatin 10 mg LR 10/03/20, # 90, 3 rf (didn't pick up)  LOV 10/03/20 FOV  04/05/20  Is it okay to send a 90 day of each to the mail order?   Please review and advise. Thanks. Dm/cma

## 2020-10-30 NOTE — Telephone Encounter (Signed)
Refills sent to the mail order.Dm/cma

## 2020-11-01 ENCOUNTER — Telehealth: Payer: Self-pay

## 2020-11-01 MED ORDER — ALBUTEROL SULFATE HFA 108 (90 BASE) MCG/ACT IN AERS: 1.0000 | INHALATION_SPRAY | Freq: Four times a day (QID) | RESPIRATORY_TRACT | 3 refills | Status: DC | PRN

## 2020-11-01 NOTE — Telephone Encounter (Signed)
RX sent to mail order per providers okay to switch to Proventil from Ventolin.  Patient notified VIA phone on change. Dm/cma

## 2020-11-01 NOTE — Telephone Encounter (Signed)
Received a medication clarification from [facility] on Ventolin HFA 90 mcg is not covered under her plan.  Covered alternatives: Proventil (generic)  tier2 ($$) Proair HFA or Proair Respiclick  tier 3 ($$$)  Please review and advise.   Thanks.  Dm/cma

## 2020-11-01 NOTE — Addendum Note (Signed)
Addended by: Konrad Saha on: 11/01/2020 02:13 PM   Modules accepted: Orders

## 2020-11-07 ENCOUNTER — Telehealth: Payer: Self-pay

## 2020-11-07 NOTE — Telephone Encounter (Signed)
Patient has requested [facility] for refills.

## 2020-11-08 NOTE — Telephone Encounter (Signed)
See note from 10/29/2020

## 2020-11-11 ENCOUNTER — Telehealth: Payer: Self-pay

## 2020-11-11 NOTE — Telephone Encounter (Signed)
Unable to reach pt unable to leave voicemail.

## 2020-11-12 ENCOUNTER — Telehealth: Payer: Self-pay | Admitting: Nurse Practitioner

## 2020-11-12 NOTE — Chronic Care Management (AMB) (Signed)
  Chronic Care Management   Note  11/12/2020 Name: Sandra Brennan MRN: [id number] DOB: October 03, 1945  Sandra Brennan is a 75 y.o. year old female who is a primary care patient of Nche, Charlene Brooke, NP. I reached out to [place] by phone today in response to a referral sent by Ms. Sandra Camps Brennan's PCP, Nche, Charlene Brooke, NP.   Sandra Brennan was given information about Chronic Care Management services today including:  CCM service includes personalized support from designated clinical staff supervised by her physician, including individualized plan of care and coordination with other care providers 24/7 contact phone numbers for assistance for urgent and routine care needs. Service will only be billed when office clinical staff spend 20 minutes or more in a month to coordinate care. Only one practitioner may furnish and bill the service in a calendar month. The patient may stop CCM services at any time (effective at the end of the month) by phone call to the office staff.   Patient agreed to services and verbal consent obtained.   Follow up plan:   Sandra Brennan

## 2020-11-13 ENCOUNTER — Ambulatory Visit (INDEPENDENT_AMBULATORY_CARE_PROVIDER_SITE_OTHER): Payer: Medicare Other | Admitting: *Deleted

## 2020-11-13 DIAGNOSIS — Z0000 Encounter for general adult medical examination without abnormal findings: Secondary | ICD-10-CM

## 2020-11-13 NOTE — Progress Notes (Signed)
Subjective:   Sandra Brennan is a 75 y.o. female who presents for Medicare Annual (Subsequent) preventive examination. I connected with  [name] on 11/13/20 by a telephone enabled telemedicine application and verified that I am speaking with the correct person using two identifiers.   I discussed the limitations of evaluation and management by telemedicine. The patient expressed understanding and agreed to proceed.    Review of Systems     Cardiac Risk Factors include: advanced age (>28mn, >>55women);diabetes mellitus;hypertension;obesity (BMI >30kg/m2)     Objective:    Today's Vitals   11/13/20 1133  PainSc: 5    There is no height or weight on file to calculate BMI.  Advanced Directives 11/13/2020 08/20/2020 08/19/2020 08/07/2020 04/12/2020 05/04/2019 03/07/2019  Does Patient Have a Medical Advance Directive? Yes Yes No No No No No  Type of AParamedicof [place] will - - - - -  Does patient want to make changes to medical advance directive? - No - Patient declined - - - - -  Copy of [place] Chart? Yes - validated most recent copy scanned in chart (See row information) No - copy requested - - - - -  Would patient like information on creating a medical advance directive? No - Patient declined No - Patient declined No - Patient declined No - Patient declined Yes (MAU/Ambulatory/Procedural Areas - Information given) No - Patient declined No - Patient declined  Pre-existing out of facility DNR order (yellow form or pink MOST form) - - - - - - -    Current Medications (verified) Outpatient Encounter Medications as of 11/13/2020  Medication Sig   albuterol (PROVENTIL HFA) 108 (90 Base) MCG/ACT inhaler Inhale 1 puff into the lungs every 6 (six) hours as needed for shortness of breath or wheezing.   albuterol (VENTOLIN HFA) 108 (90 Base) MCG/ACT inhaler Inhale into the lungs every 6 (six) hours as  needed for wheezing or shortness of breath.   allopurinol (ZYLOPRIM) 100 MG tablet Take 1 tablet (100 mg total) by mouth daily.   aspirin EC 81 MG tablet Take 81 mg by mouth daily.   atorvastatin (LIPITOR) 10 MG tablet Take 1 tablet (10 mg total) by mouth daily at 6 PM.   Carboxymethylcellulose Sodium (EYE DROPS OP) Apply to eye. Lubricant eye drops   cetirizine (ZYRTEC) 10 MG tablet Take 10 mg by mouth daily as needed.   cholecalciferol (VITAMIN D3) 25 MCG (1000 UNIT) tablet Take 1,000 Units by mouth daily.   cycloSPORINE (RESTASIS) 0.05 % ophthalmic emulsion Restasis 0.05 % eye drops in a dropperette   lisinopril (ZESTRIL) 20 MG tablet Take 1 tablet (20 mg total) by mouth daily.   Multiple Vitamin (MULTIVITAMIN) tablet Take 1 tablet by mouth daily.   omeprazole (PRILOSEC) 20 MG capsule TAKE 1 CAPSULE BY MOUTH EVERY DAY   OVER THE COUNTER MEDICATION Cystic magnesium   pregabalin (LYRICA) 150 MG capsule Take 1 capsule (150 mg total) by mouth 2 (two) times daily.   Rimegepant Sulfate (NURTEC) 75 MG TBDP Take 75 mg by mouth as needed (take 1 at onset of headache, max is 75 in 24 hours).   Sennosides 8.6 MG CAPS Take 1 capsule by mouth daily.   SYMBICORT 160-4.5 MCG/ACT inhaler Inhale 2 puffs into the lungs in the morning and at bedtime. Rinse mouth after each use   traMADol (ULTRAM) 50 MG tablet Take 1 tablet (50 mg total) by  mouth 3 (three) times daily.   albuterol (VENTOLIN HFA) 108 (90 Base) MCG/ACT inhaler INHALE 1-2 PUFFS BY MOUTH EVERY 6 HOURS AS NEEDED FOR WHEEZE OR SHORTNESS OF BREATH (Patient not taking: No sig reported)   No facility-administered encounter medications on file as of 11/13/2020.    Allergies (verified) Cymbalta [duloxetine hcl], Pineapple, Sulfa antibiotics, Influenza vaccines, Penicillins, and Theophyllines   History: Past Medical History:  Diagnosis Date   Acute posthemorrhagic anemia 09/06/2012   Anemia    Arthritis    "plenty" (2012-08-24)   Asthma    Chest pain  03/30/2016   Chronic lower back pain    "they say I need a whole new spinal column" (2012/08/24)   COPD (chronic obstructive pulmonary disease) (HCC)    Degenerative arthritis    "all over" (2012/08/24)   Fx ankle    GERD (gastroesophageal reflux disease)    Gout 05/05/2013   Hypertension    Migraines    "used to; totally stopped when I quit drinking 30 yr ago" (August 24, 2012)   Myalgia and myositis    Myocardial infarction [facility]) 1992   August 24, 2012 "mild MI when son died "   Obesity    Osteoporosis    "all over" (08/24/2012)   Shortness of breath    when stressed.   Sleep apnea    "never did fix my machine; haven't used one for 5 years; I've lost 116# since then; no problems now" (08-24-12)   Type II diabetes mellitus ([place])    "borderline; don't test; take Metformin" (2012-08-24)   Past Surgical History:  Procedure Laterality Date   ABDOMINAL HYSTERECTOMY  1990's   GANGLION CYST EXCISION Right 1980's   "wrist" (August 24, 2012)   JOINT REPLACEMENT     KNEE LIGAMENT RECONSTRUCTION Right 1980's   TONSILLECTOMY  ~ [place] Left 2012/08/24   TOTAL HIP ARTHROPLASTY Left 2012-08-24   Procedure: TOTAL HIP ARTHROPLASTY;  Surgeon: Garald Balding, MD;  Location: [facility];  Service: Orthopedics;  Laterality: Left;  Left Total Hip Arthroplasty   TOTAL HIP ARTHROPLASTY Left 08/28/2012   Procedure: Irrigation and Debridement hip ;  Surgeon: Mcarthur Rossetti, MD;  Location: [place];  Service: Orthopedics;  Laterality: Left;   TOTAL KNEE ARTHROPLASTY Left 2001   TOTAL KNEE ARTHROPLASTY Right 2004   Family History  Problem Relation Age of Onset   Arthritis Mother    Arthritis Father    Colon cancer Father    Diabetes Sister    Arthritis Sister    Diabetes Maternal Grandmother    Arthritis Maternal Grandmother    Diabetes Maternal Grandfather    Arthritis Maternal Grandfather    Social History   Socioeconomic History   Marital status: Widowed    Spouse name: Not on file   Number of  children: 5   Years of education: PhD   Highest education level: Not on file  Occupational History   Occupation: Retired  Tobacco Use   Smoking status: Former    Packs/day: 0.12    Years: 2.00    Pack years: 0.24    Types: Cigarettes   Smokeless tobacco: Never   Tobacco comments:    08-24-2012 "quit 30 years ago"  Vaping Use   Vaping Use: Never used  Substance and Sexual Activity   Alcohol use: No    Alcohol/week: 0.0 standard drinks    Comment: 08/24/12 "used to be a beeralcoholic"; stopped ~ 30 yr ago"   Drug use: No   Sexual activity:  Never  Other Topics Concern   Not on file  Social History Narrative   Lives at home with her mother and caregiver.   Occasional use of caffeine.   Right-handed.   Social Determinants of Health   Financial Resource Strain: Low Risk    Difficulty of Paying Living Expenses: Not hard at all  Food Insecurity: No Food Insecurity   Worried About Charity fundraiser in the Last Year: Never true   [place] in the Last Year: Never true  Transportation Needs: No Transportation Needs   Lack of Transportation (Medical): No   Lack of Transportation (Non-Medical): No  Physical Activity: Inactive   Days of Exercise per Week: 0 days   Minutes of Exercise per Session: 0 min  Stress: No Stress Concern Present   Feeling of Stress : Not at all  Social Connections: Moderately Integrated   Frequency of Communication with Friends and Family: Three times a week   Frequency of Social Gatherings with Friends and Family: Once a week   Attends Religious Services: More than 4 times per year   Active Member of [employer] or Organizations: Yes   Attends Music therapist: More than 4 times per year   Marital Status: Divorced    Tobacco Counseling Counseling given: Not Answered Tobacco comments: 08/03/2012 "quit 30 years ago"   Clinical Intake:  Pre-visit preparation completed: Yes  Pain : 0-10 Pain Score: 5  Pain Type: Chronic pain Pain  Location: Leg Pain Descriptors / Indicators: Burning, Discomfort Pain Onset: More than a month ago Pain Frequency: Constant Pain Relieving Factors: lyrica,tylenol,tramadol Effect of Pain on Daily Activities: yes  Pain Relieving Factors: lyrica,tylenol,tramadol  Nutritional Risks: None Diabetes: Yes CBG done?: No Did pt. bring in CBG monitor from home?: No  How often do you need to have someone help you when you read instructions, pamphlets, or other written materials from your doctor or pharmacy?: 1 - Never  Diabetic?  Yes  Nutrition Risk Assessment:  Has the patient had any N/V/D within the last 2 months?  No  Does the patient have any non-healing wounds?  No  Has the patient had any unintentional weight loss or weight gain?  No   Diabetes:  Is the patient diabetic?  Yes  If diabetic, was a CBG obtained today?  No  Did the patient bring in their glucometer from home?  No  How often do you monitor your CBG's? 0.   Financial Strains and Diabetes Management:  Are you having any financial strains with the device, your supplies or your medication? No .  Does the patient want to be seen by Chronic Care Management for management of their diabetes?  No  Would the patient like to be referred to a Nutritionist or for Diabetic Management?  No   Diabetic Exams:  Diabetic Eye Exam: Completed . Overdue for diabetic eye exam. Pt has been advised about the importance in completing this exam.   Diabetic Foot Exam: Completed 2021. Pt has been advised about the importance in completing this exam.    Interpreter Needed?: No  Information entered by :: Leroy Kennedy LPN   Activities of Daily Living In your present state of health, do you have any difficulty performing the following activities: 11/13/2020 08/20/2020  Hearing? N N  Vision? N N  Difficulty concentrating or making decisions? N N  Walking or climbing stairs? Y Y  Dressing or bathing? Y N  Doing errands, shopping? Tempie Donning  Preparing Food and eating ? N -  Using the Toilet? N -  In the past six months, have you accidently leaked urine? Y -  Do you have problems with loss of bowel control? N -  Managing your Medications? N -  Managing your Finances? N -  Housekeeping or managing your Housekeeping? N -  Some recent data might be hidden    Patient Care Team: Nche, Charlene Brooke, NP as PCP - General (Internal Medicine) Garald Balding, MD as Consulting Physician (Orthopedic Surgery) Germaine Pomfret, [facility] as Pharmacist (Pharmacist)  Indicate any recent Medical Services you may have received from other than [facility] providers in the past year (date may be approximate).     Assessment:   This is a routine wellness examination for [place].  Hearing/Vision screen Hearing Screening - Comments:: No trouble hearing Vision Screening - Comments:: Up to date [facility]     Dietary issues and exercise activities discussed: Current Exercise Habits: The patient does not participate in regular exercise at present   Goals Addressed             This Visit's Progress    Eat healthier   On track    Patient Stated       Would like to be able to walk better       Depression Screen PHQ 2/9 Scores 11/13/2020 10/03/2020 08/07/2020 08/02/2020 08/02/2020 08/23/2019 05/04/2019  PHQ - 2 Score 1 1 0 2 0 0 0  PHQ- 9 Score 7 9 - - - - -    Fall Risk Fall Risk  11/13/2020 08/07/2020 08/02/2020 03/26/2020 02/02/2020  Falls in the past year? '1 1 1 1 1  '$ Comment - - - - -  Number falls in past yr: '1 1 1 '$ 0 1  Comment - - at least 5--using a walker now - -  Injury with Fall? '1 1 1 1 '$ 0  Comment - - - - -  Risk Factor Category  - - - - -  Risk for fall due to : Impaired balance/gait;History of fall(s) Impaired balance/gait;Impaired mobility Impaired balance/gait;History of fall(s) History of fall(s) -  Risk for fall due to: Comment - - - - -  Follow up Falls evaluation completed;Education provided;Falls prevention discussed - - - -     FALL RISK PREVENTION PERTAINING TO THE HOME:  Any stairs in or around the home? No  If so, are there any without handrails? No  Home free of loose throw rugs in walkways, pet beds, electrical cords, etc? Yes  Adequate lighting in your home to reduce risk of falls? Yes   ASSISTIVE DEVICES UTILIZED TO PREVENT FALLS:  Life alert? No  Use of a cane, walker or w/c? Yes  Grab bars in the bathroom? Yes  Shower chair or bench in shower? Yes  Elevated toilet seat or a handicapped toilet? Yes   TIMED UP AND GO:    Cognitive Function:  Normal cognitive status assessed by direct observation by this Nurse Health Advisor. No abnormalities found.   MMSE - Mini Mental State Exam 04/01/2017  Orientation to time 5  Orientation to Place 5  Registration 3  Attention/ Calculation 5  Recall 1  Language- name 2 objects 2  Language- repeat 1  Language- follow 3 step command 3  Language- read & follow direction 1  Write a sentence 1  Copy design 1  Total score 28        Immunizations Immunization History  Administered Date(s) Administered  Moderna Sars-Covid-2 Vaccination 05/09/2019, 06/10/2019, 09/05/2020   Pneumococcal Conjugate-13 06/02/2016   Pneumococcal Polysaccharide-23 08/31/2017    Tetanus   Declined  Flu Vaccine status: Due, Education has been provided regarding the importance of this vaccine. Advised may receive this vaccine at local pharmacy or Health Dept. Aware to provide a copy of the vaccination record if obtained from local pharmacy or Health Dept. Verbalized acceptance and understanding.  Pneumococcal vaccine status: Up to date  Covid-19 vaccine status: Information provided on how to obtain vaccines.   Qualifies for Shingles Vaccine? No   Zostavax completed No   Shingrix Completed?: No.    Education has been provided regarding the importance of this vaccine. Patient has been advised to call insurance company to determine out of pocket expense if they have not  yet received this vaccine. Advised may also receive vaccine at local pharmacy or Health Dept. Verbalized acceptance and understanding.  Screening Tests Health Maintenance  Topic Date Due   Zoster Vaccines- Shingrix (1 of 2) Never done   OPHTHALMOLOGY EXAM  05/12/2019   FOOT EXAM  08/22/2020   TETANUS/TDAP  11/13/2021 (Originally 06/07/1964)   COVID-19 Vaccine (4 - Booster for Moderna series) 11/28/2020   HEMOGLOBIN A1C  04/05/2021   COLONOSCOPY (Pts 45-80yr Insurance coverage will need to be confirmed)  01/01/2022   MAMMOGRAM  03/23/2022   DEXA SCAN  Completed   Hepatitis C Screening  Completed   PNA vac Low Risk Adult  Completed   HPV VACCINES  Aged Out    Health Maintenance  Health Maintenance Due  Topic Date Due   Zoster Vaccines- Shingrix (1 of 2) Never done   OPHTHALMOLOGY EXAM  05/12/2019   FOOT EXAM  08/22/2020    Colorectal cancer screening: Type of screening: Colonoscopy. Completed  . Repeat every due 2023 years  Mammogram status: Completed  . Repeat every year  Bone Density status: Completed  . Results reflect: Bone density results: OSTEOPOROSIS. Repeat every 2 years.  Lung Cancer Screening: (Low Dose CT Chest recommended if Age 73-80years, 30 pack-year currently smoking OR have quit w/in 15years.) does not qualify.   Lung Cancer Screening Referral:   Additional Screening:  Hepatitis C Screening: does not qualify; Completed   Vision Screening: Recommended annual ophthalmology exams for early detection of glaucoma and other disorders of the eye. Is the patient up to date with their annual eye exam?  Yes  Who is the provider or what is the name of the office in which the patient attends annual [facility] exams[facility] pt is not established with a provider, would they like to be referred to a provider to establish care? No .   Dental Screening: Recommended annual dental exams for proper oral hygiene  Community Resource Referral / Chronic Care  Management: CRR required this visit?  No   CCM required this visit?  No      Plan:     I have personally reviewed and noted the following in the patient's chart:   Medical and social history Use of alcohol, tobacco or illicit drugs  Current medications and supplements including opioid prescriptions.  Functional ability and status Nutritional status Physical activity Advanced directives List of other physicians Hospitalizations, surgeries, and ER visits in previous 12 months Vitals Screenings to include cognitive, depression, and falls Referrals and appointments  In addition, I have reviewed and discussed with patient certain preventive protocols, quality metrics, and best practice recommendations. A written personalized care plan for preventive services  as well as general preventive health recommendations were provided to patient.     Leroy Kennedy, LPN   [id number]   Nurse Notes:

## 2020-11-13 NOTE — Patient Instructions (Signed)
Sandra Brennan , Thank you for taking time to come for your Medicare Wellness Visit. I appreciate your ongoing commitment to your health goals. Please review the following plan we discussed and let me know if I can assist you in the future.   Screening recommendations/referrals: Colonoscopy: up to date Mammogram: up to date Bone Density: up to date Recommended yearly ophthalmology/optometry visit for glaucoma screening and checkup Recommended yearly dental visit for hygiene and checkup  Vaccinations: Influenza vaccine: Education provided Pneumococcal vaccine: up to date Tdap vaccine: Declined Shingles vaccine: Education provided    Advanced directives: copy requested  Conditions/risks identified:   Next appointment: 04-05-2021 @ 9:00 am Dr. Lorayne Marek   Preventive Care 73 Years and Older, Female Preventive care refers to lifestyle choices and visits with your health care provider that can promote health and wellness. What does preventive care include? A yearly physical exam. This is also called an annual well check. Dental exams once or twice a year. Routine eye exams. Ask your health care provider how often you should have your eyes checked. Personal lifestyle choices, including: Daily care of your teeth and gums. Regular physical activity. Eating a healthy diet. Avoiding tobacco and drug use. Limiting alcohol use. Practicing safe sex. Taking low-dose aspirin every day. Taking vitamin and mineral supplements as recommended by your health care provider. What happens during an annual well check? The services and screenings done by your health care provider during your annual well check will depend on your age, overall health, lifestyle risk factors, and family history of disease. Counseling  Your health care provider may ask you questions about your: Alcohol use. Tobacco use. Drug use. Emotional well-being. Home and relationship well-being. Sexual activity. Eating habits. History  of falls. Memory and ability to understand (cognition). Work and work Statistician. Reproductive health. Screening  You may have the following tests or measurements: Height, weight, and BMI. Blood pressure. Lipid and cholesterol levels. These may be checked every 5 years, or more frequently if you are over 27 years old. Skin check. Lung cancer screening. You may have this screening every year starting at age 75 if you have a 30-pack-year history of smoking and currently smoke or have quit within the past 15 years. Fecal occult blood test (FOBT) of the stool. You may have this test every year starting at age 9. Flexible sigmoidoscopy or colonoscopy. You may have a sigmoidoscopy every 5 years or a colonoscopy every 10 years starting at age 32. Hepatitis C blood test. Hepatitis B blood test. Sexually transmitted disease (STD) testing. Diabetes screening. This is done by checking your blood sugar (glucose) after you have not eaten for a while (fasting). You may have this done every 1-3 years. Bone density scan. This is done to screen for osteoporosis. You may have this done starting at age 73. Mammogram. This may be done every 1-2 years. Talk to your health care provider about how often you should have regular mammograms. Talk with your health care provider about your test results, treatment options, and if necessary, the need for more tests. Vaccines  Your health care provider may recommend certain vaccines, such as: Influenza vaccine. This is recommended every year. Tetanus, diphtheria, and acellular pertussis (Tdap, Td) vaccine. You may need a Td booster every 10 years. Zoster vaccine. You may need this after age 28. Pneumococcal 13-valent conjugate (PCV13) vaccine. One dose is recommended after age 26. Pneumococcal polysaccharide (PPSV23) vaccine. One dose is recommended after age 53. Talk to your health care provider about  which screenings and vaccines you need and how often you need  them. This information is not intended to replace advice given to you by your health care provider. Make sure you discuss any questions you have with your health care provider. Document Released: 03/16/2015 Document Revised: 11/07/2015 Document Reviewed: 12/19/2014 Elsevier Interactive Patient Education  2017 [employer] Prevention in the Home Falls can cause injuries. They can happen to people of all ages. There are many things you can do to make your home safe and to help prevent falls. What can I do on the outside of my home? Regularly fix the edges of walkways and driveways and fix any cracks. Remove anything that might make you trip as you walk through a door, such as a raised step or threshold. Trim any bushes or trees on the path to your home. Use bright outdoor lighting. Clear any walking paths of anything that might make someone trip, such as rocks or tools. Regularly check to see if handrails are loose or broken. Make sure that both sides of any steps have handrails. Any raised decks and porches should have guardrails on the edges. Have any leaves, snow, or ice cleared regularly. Use sand or salt on walking paths during winter. Clean up any spills in your garage right away. This includes oil or grease spills. What can I do in the bathroom? Use night lights. Install grab bars by the toilet and in the tub and shower. Do not use towel bars as grab bars. Use non-skid mats or decals in the tub or shower. If you need to sit down in the shower, use a plastic, non-slip stool. Keep the floor dry. Clean up any water that spills on the floor as soon as it happens. Remove soap buildup in the tub or shower regularly. Attach bath mats securely with double-sided non-slip rug tape. Do not have throw rugs and other things on the floor that can make you trip. What can I do in the bedroom? Use night lights. Make sure that you have a light by your bed that is easy to reach. Do not use  any sheets or blankets that are too big for your bed. They should not hang down onto the floor. Have a firm chair that has side arms. You can use this for support while you get dressed. Do not have throw rugs and other things on the floor that can make you trip. What can I do in the kitchen? Clean up any spills right away. Avoid walking on wet floors. Keep items that you use a lot in easy-to-reach places. If you need to reach something above you, use a strong step stool that has a grab bar. Keep electrical cords out of the way. Do not use floor polish or wax that makes floors slippery. If you must use wax, use non-skid floor wax. Do not have throw rugs and other things on the floor that can make you trip. What can I do with my stairs? Do not leave any items on the stairs. Make sure that there are handrails on both sides of the stairs and use them. Fix handrails that are broken or loose. Make sure that handrails are as long as the stairways. Check any carpeting to make sure that it is firmly attached to the stairs. Fix any carpet that is loose or worn. Avoid having throw rugs at the top or bottom of the stairs. If you do have throw rugs, attach them to the floor with  carpet tape. Make sure that you have a light switch at the top of the stairs and the bottom of the stairs. If you do not have them, ask someone to add them for you. What else can I do to help prevent falls? Wear shoes that: Do not have high heels. Have rubber bottoms. Are comfortable and fit you well. Are closed at the toe. Do not wear sandals. If you use a stepladder: Make sure that it is fully opened. Do not climb a closed stepladder. Make sure that both sides of the stepladder are locked into place. Ask someone to hold it for you, if possible. Clearly mark and make sure that you can see: Any grab bars or handrails. First and last steps. Where the edge of each step is. Use tools that help you move around (mobility aids)  if they are needed. These include: Canes. Walkers. Scooters. Crutches. Turn on the lights when you go into a dark area. Replace any light bulbs as soon as they burn out. Set up your furniture so you have a clear path. Avoid moving your furniture around. If any of your floors are uneven, fix them. If there are any pets around you, be aware of where they are. Review your medicines with your doctor. Some medicines can make you feel dizzy. This can increase your chance of falling. Ask your doctor what other things that you can do to help prevent falls. This information is not intended to replace advice given to you by your health care provider. Make sure you discuss any questions you have with your health care provider. Document Released: 12/14/2008 Document Revised: 07/26/2015 Document Reviewed: 03/24/2014 Elsevier Interactive Patient Education  2017 [employer].

## 2020-12-13 ENCOUNTER — Ambulatory Visit: Payer: Medicare Other

## 2020-12-13 ENCOUNTER — Telehealth: Payer: Self-pay

## 2020-12-13 NOTE — Progress Notes (Signed)
Per Clinical Pharmacist, Please reschedule patient initial office appointment on 12/20/2020.  Office initial appointment with Care management team member rescheduled for : 02/07/2021 at 3:00 pm.  [place] Pharmacist Assistant [phone]

## 2020-12-20 ENCOUNTER — Ambulatory Visit: Payer: Medicare Other

## 2021-01-05 ENCOUNTER — Encounter (HOSPITAL_COMMUNITY): Payer: Self-pay | Admitting: Emergency Medicine

## 2021-01-05 ENCOUNTER — Emergency Department (HOSPITAL_COMMUNITY): Payer: Medicare Other

## 2021-01-05 ENCOUNTER — Emergency Department (HOSPITAL_COMMUNITY)
Admission: EM | Admit: 2021-01-05 | Discharge: 2021-01-05 | Disposition: A | Discharge status: Home / Self Care | Payer: Medicare Other | Attending: Emergency Medicine | Admitting: Emergency Medicine

## 2021-01-05 ENCOUNTER — Other Ambulatory Visit: Payer: Self-pay

## 2021-01-05 DIAGNOSIS — R5383 Other fatigue: Secondary | ICD-10-CM | POA: Diagnosis not present

## 2021-01-05 DIAGNOSIS — R109 Unspecified abdominal pain: Secondary | ICD-10-CM | POA: Diagnosis not present

## 2021-01-05 DIAGNOSIS — J45909 Unspecified asthma, uncomplicated: Secondary | ICD-10-CM | POA: Insufficient documentation

## 2021-01-05 DIAGNOSIS — N3000 Acute cystitis without hematuria: Secondary | ICD-10-CM | POA: Diagnosis not present

## 2021-01-05 DIAGNOSIS — Z96653 Presence of artificial knee joint, bilateral: Secondary | ICD-10-CM | POA: Diagnosis not present

## 2021-01-05 DIAGNOSIS — R112 Nausea with vomiting, unspecified: Secondary | ICD-10-CM

## 2021-01-05 DIAGNOSIS — Z79899 Other long term (current) drug therapy: Secondary | ICD-10-CM | POA: Diagnosis not present

## 2021-01-05 DIAGNOSIS — Z20822 Contact with and (suspected) exposure to covid-19: Secondary | ICD-10-CM | POA: Diagnosis not present

## 2021-01-05 DIAGNOSIS — N183 Chronic kidney disease, stage 3 unspecified: Secondary | ICD-10-CM | POA: Insufficient documentation

## 2021-01-05 DIAGNOSIS — R059 Cough, unspecified: Secondary | ICD-10-CM | POA: Diagnosis not present

## 2021-01-05 DIAGNOSIS — R1084 Generalized abdominal pain: Secondary | ICD-10-CM

## 2021-01-05 DIAGNOSIS — Z7982 Long term (current) use of aspirin: Secondary | ICD-10-CM | POA: Insufficient documentation

## 2021-01-05 DIAGNOSIS — R070 Pain in throat: Secondary | ICD-10-CM | POA: Diagnosis not present

## 2021-01-05 DIAGNOSIS — R197 Diarrhea, unspecified: Secondary | ICD-10-CM | POA: Insufficient documentation

## 2021-01-05 DIAGNOSIS — E1122 Type 2 diabetes mellitus with diabetic chronic kidney disease: Secondary | ICD-10-CM | POA: Diagnosis not present

## 2021-01-05 DIAGNOSIS — Z7951 Long term (current) use of inhaled steroids: Secondary | ICD-10-CM | POA: Diagnosis not present

## 2021-01-05 DIAGNOSIS — Z87891 Personal history of nicotine dependence: Secondary | ICD-10-CM | POA: Insufficient documentation

## 2021-01-05 DIAGNOSIS — J449 Chronic obstructive pulmonary disease, unspecified: Secondary | ICD-10-CM | POA: Insufficient documentation

## 2021-01-05 DIAGNOSIS — R110 Nausea: Secondary | ICD-10-CM | POA: Diagnosis not present

## 2021-01-05 DIAGNOSIS — J029 Acute pharyngitis, unspecified: Secondary | ICD-10-CM | POA: Diagnosis not present

## 2021-01-05 DIAGNOSIS — I129 Hypertensive chronic kidney disease with stage 1 through stage 4 chronic kidney disease, or unspecified chronic kidney disease: Secondary | ICD-10-CM | POA: Diagnosis not present

## 2021-01-05 DIAGNOSIS — R1110 Vomiting, unspecified: Secondary | ICD-10-CM | POA: Diagnosis not present

## 2021-01-05 DIAGNOSIS — I10 Essential (primary) hypertension: Secondary | ICD-10-CM | POA: Diagnosis not present

## 2021-01-05 DIAGNOSIS — Z743 Need for continuous supervision: Secondary | ICD-10-CM | POA: Diagnosis not present

## 2021-01-05 DIAGNOSIS — Z96642 Presence of left artificial hip joint: Secondary | ICD-10-CM | POA: Diagnosis not present

## 2021-01-05 LAB — COMPREHENSIVE METABOLIC PANEL
ALT: 12 U/L (ref 0–44)
AST: 26 U/L (ref 15–41)
Albumin: 3.4 g/dL — ABNORMAL LOW (ref 3.5–5.0)
Alkaline Phosphatase: 84 U/L (ref 38–126)
Anion gap: 9 (ref 5–15)
BUN: 12 mg/dL (ref 8–23)
CO2: 23 mmol/L (ref 22–32)
Calcium: 8.9 mg/dL (ref 8.9–10.3)
Chloride: 107 mmol/L (ref 98–111)
Creatinine, Ser: 1.17 mg/dL — ABNORMAL HIGH (ref 0.44–1.00)
GFR, Estimated: 49 mL/min — ABNORMAL LOW (ref >60)
Glucose, Bld: 89 mg/dL (ref 70–99)
Potassium: 3.9 mmol/L (ref 3.5–5.1)
Sodium: 139 mmol/L (ref 135–145)
Total Bilirubin: 1 mg/dL (ref 0.3–1.2)
Total Protein: 7.3 g/dL (ref 6.5–8.1)

## 2021-01-05 LAB — URINALYSIS, ROUTINE W REFLEX MICROSCOPIC
Bilirubin Urine: NEGATIVE
Glucose, UA: NEGATIVE mg/dL
Hgb urine dipstick: NEGATIVE
Ketones, ur: NEGATIVE mg/dL
Nitrite: POSITIVE — AB
Protein, ur: NEGATIVE mg/dL
Specific Gravity, Urine: 1.015 (ref 1.005–1.030)
pH: 5 (ref 5.0–8.0)

## 2021-01-05 LAB — CBC WITH DIFFERENTIAL/PLATELET
Abs Immature Granulocytes: 0.03 10*3/uL (ref 0.00–0.07)
Basophils Absolute: 0 10*3/uL (ref 0.0–0.1)
Basophils Relative: 1 %
Eosinophils Absolute: 0.1 10*3/uL (ref 0.0–0.5)
Eosinophils Relative: 1 %
HCT: 35.6 % — ABNORMAL LOW (ref 36.0–46.0)
Hemoglobin: 11.4 g/dL — ABNORMAL LOW (ref 12.0–15.0)
Immature Granulocytes: 0 %
Lymphocytes Relative: 23 %
Lymphs Abs: 1.8 10*3/uL (ref 0.7–4.0)
MCH: 28.9 pg (ref 26.0–34.0)
MCHC: 32 g/dL (ref 30.0–36.0)
MCV: 90.4 fL (ref 80.0–100.0)
Monocytes Absolute: 0.6 10*3/uL (ref 0.1–1.0)
Monocytes Relative: 7 %
Neutro Abs: 5.3 10*3/uL (ref 1.7–7.7)
Neutrophils Relative %: 68 %
Platelets: 213 10*3/uL (ref 150–400)
RBC: 3.94 MIL/uL (ref 3.87–5.11)
RDW: 13.3 % (ref 11.5–15.5)
WBC: 7.9 10*3/uL (ref 4.0–10.5)
nRBC: 0 % (ref 0.0–0.2)

## 2021-01-05 LAB — RESP PANEL BY RT-PCR (FLU A&B, COVID) ARPGX2
Influenza A by PCR: NEGATIVE
Influenza B by PCR: NEGATIVE
SARS Coronavirus 2 by RT PCR: NEGATIVE

## 2021-01-05 LAB — LIPASE, BLOOD: Lipase: 32 U/L (ref 11–51)

## 2021-01-05 MED ORDER — DICYCLOMINE HCL 10 MG PO CAPS
20.0000 mg | ORAL_CAPSULE | Freq: Once | ORAL | Status: AC
  Administered 2021-01-05: 20 mg via ORAL
  Filled 2021-01-05: qty 2

## 2021-01-05 MED ORDER — DICYCLOMINE HCL 20 MG PO TABS: 20.0000 mg | ORAL_TABLET | Freq: Two times a day (BID) | ORAL | 0 refills | Status: DC

## 2021-01-05 MED ORDER — MORPHINE SULFATE (PF) 4 MG/ML IV SOLN
4.0000 mg | Freq: Once | INTRAVENOUS | Status: AC
  Administered 2021-01-05: 4 mg via INTRAVENOUS
  Filled 2021-01-05: qty 1

## 2021-01-05 MED ORDER — IOHEXOL 300 MG/ML  SOLN
80.0000 mL | Freq: Once | INTRAMUSCULAR | Status: AC | PRN
  Administered 2021-01-05: 80 mL via INTRAVENOUS

## 2021-01-05 MED ORDER — ONDANSETRON 4 MG PO TBDP: 4.0000 mg | ORAL_TABLET | Freq: Three times a day (TID) | ORAL | 0 refills | Status: DC | PRN

## 2021-01-05 MED ORDER — NITROFURANTOIN MONOHYD MACRO 100 MG PO CAPS: 100.0000 mg | ORAL_CAPSULE | Freq: Two times a day (BID) | ORAL | 0 refills | Status: DC

## 2021-01-05 MED ORDER — ONDANSETRON 4 MG PO TBDP
4.0000 mg | ORAL_TABLET | Freq: Once | ORAL | Status: AC
  Administered 2021-01-05: 4 mg via ORAL
  Filled 2021-01-05: qty 1

## 2021-01-05 MED ORDER — OXYCODONE-ACETAMINOPHEN 5-325 MG PO TABS
1.0000 | ORAL_TABLET | Freq: Once | ORAL | Status: AC
  Administered 2021-01-05: 1 via ORAL
  Filled 2021-01-05: qty 1

## 2021-01-05 NOTE — ED Notes (Signed)
Provided fluids for fluid challange

## 2021-01-05 NOTE — ED Triage Notes (Signed)
Pt to triage via [facility] from home.  Reports sore throat, nausea, diarrhea, cough, and chills since Wednesday.

## 2021-01-05 NOTE — ED Notes (Signed)
Pt transported to CT ?

## 2021-01-05 NOTE — ED Provider Notes (Signed)
[facility] [place] EMERGENCY DEPARTMENT Provider Note   CSN: [id number] Arrival date & time: 01/05/21  0746     History No chief complaint on file.   Sandra Brennan is a 75 y.o. female.  HPI Patient is a 75 year old female presented to the ER today with complaint of nausea, diarrhea, cough, chills, fatigue ongoing since 4 days ago.  She states that she is having 4 episodes of diarrhea per day.  She states that it is loose but not watery stool.  She denies any blood in her stool or dark or tarry appearance.  She denies any hemoptysis chest pain or difficulty breathing.  She states that her cough is generally nonproductive although she will occasionally cough up some phlegm.  She states that her sore throat is achy and constant she states it has not gotten particularly better particularly worse over the past few days.  She denies any fevers at home.  She states that she is having some achy generalized abdominal pain  She denies any vaginal discharge or pelvic pain.  No other associate symptoms.  No aggravating mitigating factors.     Past Medical History:  Diagnosis Date   Acute posthemorrhagic anemia 09/06/2012   Anemia    Arthritis    "plenty" (08-04-2012)   Asthma    Chest pain 03/30/2016   Chronic lower back pain    "they say I need a whole new spinal column" (08-04-12)   COPD (chronic obstructive pulmonary disease) (HCC)    Degenerative arthritis    "all over" (2012/08/04)   Fx ankle    GERD (gastroesophageal reflux disease)    Gout 05/05/2013   Hypertension    Migraines    "used to; totally stopped when I quit drinking 30 yr ago" (August 04, 2012)   Myalgia and myositis    Myocardial infarction [facility]) 1992   04-Aug-2012 "mild MI when son died "   Obesity    Osteoporosis    "all over" (08/04/12)   Shortness of breath    when stressed.   Sleep apnea    "never did fix my machine; haven't used one for 5 years; I've lost 116# since then; no problems now"  (04-Aug-2012)   Type II diabetes mellitus ([place])    "borderline; don't test; take Metformin" (08-04-12)    Patient Active Problem List   Diagnosis Date Noted   CKD (chronic kidney disease) stage 3, GFR 30-59 ml/min ([place]) 08/20/2020   Morbid obesity with BMI of 60.0-69.9, adult ([place]) 08/20/2020   Knee pain 08/20/2020   Intertrigo 03/27/2020   Normocytic anemia 08/23/2019   Polyethylene liner wear following total hip arthroplasty requiring isolated polyethylene liner exchange ([place]) 07/12/2019   Depression, recurrent ([place]) 07/12/2018   Migraine without aura and without status migrainosus, not intractable 09/10/2017   Post-traumatic osteoarthritis of right ankle 06/09/2016   Spondylosis without myelopathy or radiculopathy, lumbar region 06/09/2016   Cervical spinal stenosis 06/09/2016   Spinal stenosis, lumbar region, without neurogenic claudication 06/09/2016   Chronic gouty arthritis 06/04/2016   Vitamin D deficiency 06/02/2016   Hyperlipidemia 06/02/2016   Closed right ankle fracture 05/01/2016   Colon polyps 05/01/2016   Fibromyalgia syndrome 05/01/2016   Syncope 03/30/2016   Fall 03/30/2016   GERD with esophagitis 06/21/2015   Pharyngeal dysphagia 06/21/2015   Obesity 05/06/2013   Acute nontraumatic kidney injury ([place]) 05/05/2013   Osteoarthritis of left hip 08/05/2012   Type 2 diabetes mellitus with diabetic polyneuropathy, without long-term current use of insulin ([place])  08/05/2012   Hypertension 08/05/2012   Asthma, chronic 08/05/2012   Sleep apnea 08/05/2012    Past Surgical History:  Procedure Laterality Date   ABDOMINAL HYSTERECTOMY  1990's   GANGLION CYST EXCISION Right 1980's   "wrist" (08/03/2012)   JOINT REPLACEMENT     KNEE LIGAMENT RECONSTRUCTION Right 1980's   TONSILLECTOMY  ~ [place] Left 08/03/2012   TOTAL HIP ARTHROPLASTY Left 08/03/2012   Procedure: TOTAL HIP ARTHROPLASTY;  Surgeon: Garald Balding, MD;  Location: [place];  Service:  Orthopedics;  Laterality: Left;  Left Total Hip Arthroplasty   TOTAL HIP ARTHROPLASTY Left 08/28/2012   Procedure: Irrigation and Debridement hip ;  Surgeon: Mcarthur Rossetti, MD;  Location: [place];  Service: Orthopedics;  Laterality: Left;   TOTAL KNEE ARTHROPLASTY Left 2001   TOTAL KNEE ARTHROPLASTY Right 2004     OB History   No obstetric history on file.     Family History  Problem Relation Age of Onset   Arthritis Mother    Arthritis Father    Colon cancer Father    Diabetes Sister    Arthritis Sister    Diabetes Maternal Grandmother    Arthritis Maternal Grandmother    Diabetes Maternal Grandfather    Arthritis Maternal Grandfather     Social History   Tobacco Use   Smoking status: Former    Packs/day: 0.12    Years: 2.00    Pack years: 0.24    Types: Cigarettes   Smokeless tobacco: Never   Tobacco comments:    08/03/2012 "quit 30 years ago"  Vaping Use   Vaping Use: Never used  Substance Use Topics   Alcohol use: No    Alcohol/week: 0.0 standard drinks    Comment: 08/03/2012 "used to be a beeralcoholic"; stopped ~ 30 yr ago"   Drug use: No    Home Medications Prior to Admission medications   Medication Sig Start Date End Date Taking? Authorizing Provider  dicyclomine (BENTYL) 20 MG tablet Take 1 tablet (20 mg total) by mouth 2 (two) times daily. 01/05/21  Yes Roland Prine, Kathleene Hazel, PA  nitrofurantoin, macrocrystal-monohydrate, (MACROBID) 100 MG capsule Take 1 capsule (100 mg total) by mouth 2 (two) times daily. 01/05/21  Yes Iktan Aikman S, PA  ondansetron (ZOFRAN ODT) 4 MG disintegrating tablet Take 1 tablet (4 mg total) by mouth every 8 (eight) hours as needed for nausea or vomiting. 01/05/21  Yes Shaquilla Kehres S, PA  albuterol (PROVENTIL HFA) 108 (90 Base) MCG/ACT inhaler Inhale 1 puff into the lungs every 6 (six) hours as needed for shortness of breath or wheezing. 11/01/20   Nche, Charlene Brooke, NP  albuterol (VENTOLIN HFA) 108 (90 Base) MCG/ACT inhaler INHALE  1-2 PUFFS BY MOUTH EVERY 6 HOURS AS NEEDED FOR WHEEZE OR SHORTNESS OF BREATH Patient not taking: No sig reported 10/30/20   Nche, Charlene Brooke, NP  albuterol (VENTOLIN HFA) 108 (90 Base) MCG/ACT inhaler Inhale into the lungs every 6 (six) hours as needed for wheezing or shortness of breath.    [provider]  allopurinol (ZYLOPRIM) 100 MG tablet Take 1 tablet (100 mg total) by mouth daily. 10/30/20   Nche, Charlene Brooke, NP  aspirin EC 81 MG tablet Take 81 mg by mouth daily.    [provider]  atorvastatin (LIPITOR) 10 MG tablet Take 1 tablet (10 mg total) by mouth daily at 6 PM. 10/30/20   Nche, Charlene Brooke, NP  Carboxymethylcellulose Sodium (EYE DROPS OP) Apply  to eye. Lubricant eye drops    [provider]  cetirizine (ZYRTEC) 10 MG tablet Take 10 mg by mouth daily as needed.    [provider]  cholecalciferol (VITAMIN D3) 25 MCG (1000 UNIT) tablet Take 1,000 Units by mouth daily.    [provider]  cycloSPORINE (RESTASIS) 0.05 % ophthalmic emulsion Restasis 0.05 % eye drops in a dropperette    [provider]  lisinopril (ZESTRIL) 20 MG tablet Take 1 tablet (20 mg total) by mouth daily. 10/30/20   Nche, Charlene Brooke, NP  Multiple Vitamin (MULTIVITAMIN) tablet Take 1 tablet by mouth daily.    [provider]  omeprazole (PRILOSEC) 20 MG capsule TAKE 1 CAPSULE BY MOUTH EVERY DAY 10/30/20   Nche, Charlene Brooke, NP  OVER THE COUNTER MEDICATION Cystic magnesium    [provider]  pregabalin (LYRICA) 150 MG capsule Take 1 capsule (150 mg total) by mouth 2 (two) times daily. 08/02/20   Bayard Hugger, NP  Rimegepant Sulfate (NURTEC) 75 MG TBDP Take 75 mg by mouth as needed (take 1 at onset of headache, max is 75 in 24 hours). 10/29/20   Marcial Pacas, MD  Sennosides 8.6 MG CAPS Take 1 capsule by mouth daily.    [provider]  SYMBICORT 160-4.5 MCG/ACT inhaler Inhale 2 puffs into the lungs in the morning and at bedtime.  Rinse mouth after each use 10/30/20   Nche, Charlene Brooke, NP  traMADol (ULTRAM) 50 MG tablet Take 1 tablet (50 mg total) by mouth 3 (three) times daily. 08/02/20   Bayard Hugger, NP    Allergies    Cymbalta [duloxetine hcl], Pineapple, Sulfa antibiotics, Influenza vaccines, Penicillins, and Theophyllines  Review of Systems   Review of Systems  Constitutional:  Positive for chills and fatigue. Negative for fever.  HENT:  Positive for sore throat. Negative for congestion.   Eyes:  Negative for pain.  Respiratory:  Positive for cough. Negative for shortness of breath.   Cardiovascular:  Negative for chest pain and leg swelling.  Gastrointestinal:  Positive for abdominal pain, diarrhea and nausea. Negative for vomiting.  Genitourinary:  Negative for dysuria.  Musculoskeletal:  Negative for myalgias.  Skin:  Negative for rash.  Neurological:  Negative for dizziness and headaches.   Physical Exam Updated Vital Signs BP (!) 147/85 (BP Location: Left Arm)   Pulse 77   Temp 98.4 F (36.9 C) (Oral)   Resp 18   Ht 5\' 1"  (1.549 m)   Wt 124.7 kg   SpO2 98%   BMI 51.96 kg/m   Physical Exam Vitals and nursing note reviewed.  Constitutional:      General: She is not in acute distress. HENT:     Head: Normocephalic and atraumatic.     Nose: Nose normal.     Mouth/Throat:     Mouth: Mucous membranes are moist.  Eyes:     General: No scleral icterus. Cardiovascular:     Rate and Rhythm: Normal rate and regular rhythm.     Pulses: Normal pulses.     Heart sounds: Normal heart sounds.  Pulmonary:     Effort: Pulmonary effort is normal. No respiratory distress.     Breath sounds: No wheezing.  Abdominal:     Palpations: Abdomen is soft.     Tenderness: There is no abdominal tenderness. There is no guarding or rebound.  Musculoskeletal:     Cervical back: Normal range of motion.     Right lower leg:  No edema.     Left lower leg: No edema.  Skin:    General: Skin is warm and dry.      Capillary Refill: Capillary refill takes less than 2 seconds.  Neurological:     Mental Status: She is alert. Mental status is at baseline.  Psychiatric:        Mood and Affect: Mood normal.        Behavior: Behavior normal.    ED Results / Procedures / Treatments   Labs (all labs ordered are listed, but only abnormal results are displayed) Labs Reviewed  COMPREHENSIVE METABOLIC PANEL - Abnormal; Notable for the following components:      Result Value   Creatinine, Ser 1.17 (*)    Albumin 3.4 (*)    GFR, Estimated 49 (*)    All other components within normal limits  CBC WITH DIFFERENTIAL/PLATELET - Abnormal; Notable for the following components:   Hemoglobin 11.4 (*)    HCT 35.6 (*)    All other components within normal limits  URINALYSIS, ROUTINE W REFLEX MICROSCOPIC - Abnormal; Notable for the following components:   APPearance HAZY (*)    Nitrite POSITIVE (*)    Leukocytes,Ua TRACE (*)    Bacteria, UA RARE (*)    All other components within normal limits  RESP PANEL BY RT-PCR (FLU A&B, COVID) ARPGX2  URINE CULTURE  LIPASE, BLOOD    EKG None  Radiology DG Chest 2 View  Result Date: 01/05/2021 CLINICAL DATA:  Cough, fatigue, vomiting. EXAM: CHEST - 2 VIEW COMPARISON:  Chest radiograph 08/20/2020 FINDINGS: Stable borderline enlarged cardiac silhouette. Lungs are clear. No pleural effusion or pneumothorax. Multilevel degenerative disc disease in the distal thoracic spine. Severe osteoarthritis of the glenohumeral joints. IMPRESSION: No acute cardiopulmonary abnormality. Electronically Signed   By: Ileana Roup M.D.   On: 01/05/2021 11:04   CT ABDOMEN PELVIS W CONTRAST  Result Date: 01/05/2021 CLINICAL DATA:  75 year old female with right lower quadrant abdominal pain EXAM: CT ABDOMEN AND PELVIS WITH CONTRAST TECHNIQUE: Multidetector CT imaging of the abdomen and pelvis was performed using the standard protocol following bolus administration of intravenous contrast.  CONTRAST:  72mL OMNIPAQUE IOHEXOL 300 MG/ML  SOLN COMPARISON:  05/05/2013 FINDINGS: Lower chest: No acute finding of the lower chest Hepatobiliary: Unremarkable liver. Cholelithiasis without inflammatory changes. Pancreas: Unremarkable Spleen: Unremarkable Adrenals/Urinary Tract: - Right adrenal gland:  Unremarkable - Left adrenal gland: Unremarkable. - Right kidney: No hydronephrosis, nephrolithiasis, inflammation, or ureteral dilation. Cyst on the inferolateral cortex of the right kidney. - Left Kidney: No hydronephrosis, nephrolithiasis, inflammation, or ureteral dilation. Renal cortical thinning. There is a rounded intermediate density/enhancing structure in the interpolar region of the left kidney, 20 mm. Hounsfield units measure approximately 20-30 on both the early and late phase imaging suggesting complex cyst/calyx. Unremarkable course of the left ureter. - Urinary Bladder: Urinary bladder is relatively decompressed though not well visualized secondary to streak artifact in the pelvis. Stomach/Bowel: - Stomach: Hiatal hernia with otherwise unremarkable stomach. - Small bowel: Unremarkable - Appendix: Normal. - Colon: Mild stool burden. No inflammatory changes. Diverticular disease of the sigmoid colon with no acute inflammatory changes. Vascular/Lymphatic: Minimal atherosclerosis of the abdominal aorta. No aneurysm. No inflammatory changes. Mesenteric arteries and renal arteries are patent. Iliac arteries and proximal femoral arteries are patent. Unremarkable venous structures. No adenopathy. Reproductive: Surgical changes of hysterectomy. Pelvis not well evaluated secondary to the streak artifact. Other: None Musculoskeletal: Surgical changes of fusion of L1-L2. Focal kyphotic deformity at this  site. There is 50% narrowing of the canal at the fused disc space of L1-L2. Advanced degenerative changes of the remainder of the lower thoracic and the lumbar spine with vacuum disc phenomenon at nearly all levels.  No acute displaced fracture. Surgical changes of left hip arthroplasty. IMPRESSION: Negative for acute finding of the abdomen/pelvis. Aortic Atherosclerosis (ICD10-I70.0). Ancillary findings as above. Electronically Signed   By: Corrie Mckusick D.O.   On: 01/05/2021 13:56    Procedures Procedures   Medications Ordered in ED Medications  ondansetron (ZOFRAN-ODT) disintegrating tablet 4 mg (4 mg Oral Given 01/05/21 1059)  dicyclomine (BENTYL) capsule 20 mg (20 mg Oral Given 01/05/21 1058)  oxyCODONE-acetaminophen (PERCOCET/ROXICET) 5-325 MG per tablet 1 tablet (1 tablet Oral Given 01/05/21 1059)  morphine 4 MG/ML injection 4 mg (4 mg Intravenous Given 01/05/21 1229)  iohexol (OMNIPAQUE) 300 MG/ML solution 80 mL (80 mLs Intravenous Contrast Given 01/05/21 1258)    ED Course  I have reviewed the triage vital signs and the nursing notes.  Pertinent labs & imaging results that were available during my care of the patient were reviewed by me and considered in my medical decision making (see chart for details).  75 year old female presenting today with primarily nausea diarrhea and cough and chills.  Initially physical exam is notable for no abdominal tenderness however on my repeat examination patient seems to now have some right lower quadrant left lower quadrant abdominal tenderness with palpation.  Will obtain CT imaging.  Clinical Course as of 01/05/21 1547  Sat Jan 05, 2021  1217 Repeat abd exam now with RLQ and LLQ TTP  Will obtain CT abd pel w contrast. Given morphine. Worsening abd exam as reason for CT imaging.  [WF]  1431 CT unremarkable.   IMPRESSION: Negative for acute finding of the abdomen/pelvis.   Aortic Atherosclerosis (ICD10-I70.0).   Ancillary findings as above.   [WF]  9528 Chest x-ray unremarkable.  Urinalysis with rare bacteria trace leukocytes positive for nitrates  CBC with no leukocytosis very mild chronic anemia.  Lipase within normal limits.  Creatinine at  baseline.  CMP unremarkable.  COVID influenza negative. [WF]  1432 SpO2: 100 % [WF]  1432 Resp: 12 [WF]  1432 MAP (mmHg): 99 [WF]  1432 BP(!): 157/77 [WF]  1432 Pulse Rate: 66 [WF]    Clinical Course User Index [WF] Tedd Sias, PA   MDM Rules/Calculators/A&P                          Patient reassessed after CT scan.  I personally reviewed the CT scan and agree with radiologist read no acute abnormality.  Urine culture pending.  She states she feels improved after the morphine.  No longer has any abdominal tenderness.  Will discharge home with follow-up with PCP return precautions given.  Will discharge home with Bentyl for pain and Zofran for nausea and Macrobid for UTI.  Sandra Brennan was evaluated in Emergency Department on 01/05/2021 for the symptoms described in the history of present illness. She was evaluated in the context of the global COVID-19 pandemic, which necessitated consideration that the patient might be at risk for infection with the SARS-CoV-2 virus that causes COVID-19. Institutional protocols and algorithms that pertain to the evaluation of patients at risk for COVID-19 are in a state of rapid change based on information released by regulatory bodies including the CDC and federal and state organizations. These policies and algorithms were followed during the patient's care  in the ED.   Final Clinical Impression(s) / ED Diagnoses Final diagnoses:  Nausea vomiting and diarrhea  Generalized abdominal pain  Acute cystitis without hematuria    Rx / DC Orders ED Discharge Orders          Ordered    dicyclomine (BENTYL) 20 MG tablet  2 times daily        01/05/21 1434    ondansetron (ZOFRAN ODT) 4 MG disintegrating tablet  Every 8 hours PRN        01/05/21 1434    nitrofurantoin, macrocrystal-monohydrate, (MACROBID) 100 MG capsule  2 times daily        01/05/21 1434             Tedd Sias, [place] 01/05/21 [place], [place], DO 01/05/21 1548

## 2021-01-05 NOTE — ED Provider Notes (Signed)
Emergency Medicine Provider Triage Evaluation Note  Sandra Brennan , a 75 y.o. female  was evaluated in triage.  Pt complains of nausea and diarrhea for the past 4 days. Pt also complains of subjective fevers, chills, sore throat, headache, and cough. She denies any recent sick contacts. She is vaccinated for COVID. She has an allergy to the flu vaccine. She denies any vomiting. States she has been taking all her regular medications at home. No other complaints.  Review of Systems  Positive: + headache, fever, chills, nausea, diarrhea, sore throat, cough Negative: - vomiting, abdominal pain  Physical Exam  BP (!) 160/86 (BP Location: Right Wrist)   Pulse 67   Temp 98.9 F (37.2 C) (Oral)   Resp 17   SpO2 100%  Gen:   Awake, no distress   Resp:  Normal effort  MSK:   Moves extremities without difficulty  Other:    Medical Decision Making  Medically screening exam initiated at 8:16 AM.  Appropriate orders placed.  Sandra Brennan was informed that the remainder of the evaluation will be completed by another provider, this initial triage assessment does not replace that evaluation, and the importance of remaining in the ED until their evaluation is complete.     Eustaquio Maize, PA-C 01/05/21 [id number]    Davonna Belling, MD 01/05/21 1556

## 2021-01-05 NOTE — Discharge Instructions (Addendum)
Your work-up today was quite reassuring.  You do have some evidence of urinary tract infection I think it is quite reasonable for [place] to go ahead and treat you for this.  I will treat you with a medication called Macrobid you will take this twice daily for 5 days.  Please follow-up with your primary care doctor early next week to continue to evaluate your symptoms  I have also prescribed a medication called Bentyl that will help with the abdominal pain and Zofran which will help with the nausea.  Drink plenty of water I recommend bread rice applesauce toast bananas and avocados this will help with the diarrhea.

## 2021-01-06 ENCOUNTER — Other Ambulatory Visit: Payer: Self-pay

## 2021-01-06 ENCOUNTER — Encounter (HOSPITAL_BASED_OUTPATIENT_CLINIC_OR_DEPARTMENT_OTHER): Payer: Self-pay | Admitting: Emergency Medicine

## 2021-01-06 ENCOUNTER — Emergency Department (HOSPITAL_BASED_OUTPATIENT_CLINIC_OR_DEPARTMENT_OTHER)
Admission: EM | Admit: 2021-01-06 | Discharge: 2021-01-06 | Disposition: A | Discharge status: Home / Self Care | Payer: Medicare Other | Attending: Emergency Medicine | Admitting: Emergency Medicine

## 2021-01-06 DIAGNOSIS — Z96642 Presence of left artificial hip joint: Secondary | ICD-10-CM | POA: Diagnosis not present

## 2021-01-06 DIAGNOSIS — B349 Viral infection, unspecified: Secondary | ICD-10-CM | POA: Insufficient documentation

## 2021-01-06 DIAGNOSIS — N183 Chronic kidney disease, stage 3 unspecified: Secondary | ICD-10-CM | POA: Insufficient documentation

## 2021-01-06 DIAGNOSIS — Z7982 Long term (current) use of aspirin: Secondary | ICD-10-CM | POA: Diagnosis not present

## 2021-01-06 DIAGNOSIS — J45909 Unspecified asthma, uncomplicated: Secondary | ICD-10-CM | POA: Insufficient documentation

## 2021-01-06 DIAGNOSIS — I129 Hypertensive chronic kidney disease with stage 1 through stage 4 chronic kidney disease, or unspecified chronic kidney disease: Secondary | ICD-10-CM | POA: Insufficient documentation

## 2021-01-06 DIAGNOSIS — R0789 Other chest pain: Secondary | ICD-10-CM | POA: Insufficient documentation

## 2021-01-06 DIAGNOSIS — J449 Chronic obstructive pulmonary disease, unspecified: Secondary | ICD-10-CM | POA: Insufficient documentation

## 2021-01-06 DIAGNOSIS — Z7951 Long term (current) use of inhaled steroids: Secondary | ICD-10-CM | POA: Insufficient documentation

## 2021-01-06 DIAGNOSIS — Z96653 Presence of artificial knee joint, bilateral: Secondary | ICD-10-CM | POA: Insufficient documentation

## 2021-01-06 DIAGNOSIS — Z87891 Personal history of nicotine dependence: Secondary | ICD-10-CM | POA: Insufficient documentation

## 2021-01-06 DIAGNOSIS — Z79899 Other long term (current) drug therapy: Secondary | ICD-10-CM | POA: Insufficient documentation

## 2021-01-06 DIAGNOSIS — E1142 Type 2 diabetes mellitus with diabetic polyneuropathy: Secondary | ICD-10-CM | POA: Diagnosis not present

## 2021-01-06 LAB — BASIC METABOLIC PANEL
Anion gap: 11 (ref 5–15)
BUN: 13 mg/dL (ref 8–23)
CO2: 25 mmol/L (ref 22–32)
Calcium: 8.8 mg/dL — ABNORMAL LOW (ref 8.9–10.3)
Chloride: 104 mmol/L (ref 98–111)
Creatinine, Ser: 1.12 mg/dL — ABNORMAL HIGH (ref 0.44–1.00)
GFR, Estimated: 52 mL/min — ABNORMAL LOW (ref >60)
Glucose, Bld: 86 mg/dL (ref 70–99)
Potassium: 3.7 mmol/L (ref 3.5–5.1)
Sodium: 140 mmol/L (ref 135–145)

## 2021-01-06 LAB — CBC
HCT: 35.8 % — ABNORMAL LOW (ref 36.0–46.0)
Hemoglobin: 11.2 g/dL — ABNORMAL LOW (ref 12.0–15.0)
MCH: 28 pg (ref 26.0–34.0)
MCHC: 31.3 g/dL (ref 30.0–36.0)
MCV: 89.5 fL (ref 80.0–100.0)
Platelets: 218 10*3/uL (ref 150–400)
RBC: 4 MIL/uL (ref 3.87–5.11)
RDW: 13.5 % (ref 11.5–15.5)
WBC: 7.9 10*3/uL (ref 4.0–10.5)
nRBC: 0 % (ref 0.0–0.2)

## 2021-01-06 LAB — TROPONIN I (HIGH SENSITIVITY)
Troponin I (High Sensitivity): 6 ng/L (ref <18)
Troponin I (High Sensitivity): 6 ng/L (ref <18)

## 2021-01-06 NOTE — ED Provider Notes (Signed)
[facility] EMERGENCY DEPARTMENT Provider Note   CSN: [id number] Arrival date & time: 01/06/21  1212     History Chief Complaint  Patient presents with   Chest Pain    Sandra Brennan is a 75 y.o. female.  Patient seen yesterday at [facility] with extensive work-up.  COVID flu testing was done and was negative.  Patient treated for gastroenteritis and treated for upper respiratory infection.  Urinary tract infection.  Chest x-ray negative for pneumonia.  Patient started on Macrobid for presumed urinary tract infection.  Urine culture here today is growing E. coli.  Patient has gotten the prescription has started taking it.  Came in today because of of some left anterior lateral chest pain with coughing.  Made much worse with coughing.  Other symptoms are improving some.      Past Medical History:  Diagnosis Date   Acute posthemorrhagic anemia 09/06/2012   Anemia    Arthritis    "plenty" (08-25-2012)   Asthma    Chest pain 03/30/2016   Chronic lower back pain    "they say I need a whole new spinal column" (08-25-2012)   COPD (chronic obstructive pulmonary disease) (HCC)    Degenerative arthritis    "all over" (2012/08/25)   Fx ankle    GERD (gastroesophageal reflux disease)    Gout 05/05/2013   Hypertension    Migraines    "used to; totally stopped when I quit drinking 30 yr ago" (August 25, 2012)   Myalgia and myositis    Myocardial infarction [facility]) 1992   2012/08/25 "mild MI when son died "   Obesity    Osteoporosis    "all over" (August 25, 2012)   Shortness of breath    when stressed.   Sleep apnea    "never did fix my machine; haven't used one for 5 years; I've lost 116# since then; no problems now" (08-25-2012)   Type II diabetes mellitus ([place])    "borderline; don't test; take Metformin" (08-25-2012)    Patient Active Problem List   Diagnosis Date Noted   CKD (chronic kidney disease) stage 3, GFR 30-59 ml/min ([place]) 08/20/2020   Morbid obesity with BMI of 60.0-69.9,  adult ([place]) 08/20/2020   Knee pain 08/20/2020   Intertrigo 03/27/2020   Normocytic anemia 08/23/2019   Polyethylene liner wear following total hip arthroplasty requiring isolated polyethylene liner exchange ([place]) 07/12/2019   Depression, recurrent ([place]) 07/12/2018   Migraine without aura and without status migrainosus, not intractable 09/10/2017   Post-traumatic osteoarthritis of right ankle 06/09/2016   Spondylosis without myelopathy or radiculopathy, lumbar region 06/09/2016   Cervical spinal stenosis 06/09/2016   Spinal stenosis, lumbar region, without neurogenic claudication 06/09/2016   Chronic gouty arthritis 06/04/2016   Vitamin D deficiency 06/02/2016   Hyperlipidemia 06/02/2016   Closed right ankle fracture 05/01/2016   Colon polyps 05/01/2016   Fibromyalgia syndrome 05/01/2016   Syncope 03/30/2016   Fall 03/30/2016   GERD with esophagitis 06/21/2015   Pharyngeal dysphagia 06/21/2015   Obesity 05/06/2013   Acute nontraumatic kidney injury ([place]) 05/05/2013   Osteoarthritis of left hip 08/05/2012   Type 2 diabetes mellitus with diabetic polyneuropathy, without long-term current use of insulin ([place]) 08/05/2012   Hypertension 08/05/2012   Asthma, chronic 08/05/2012   Sleep apnea 08/05/2012    Past Surgical History:  Procedure Laterality Date   ABDOMINAL HYSTERECTOMY  1990's   GANGLION CYST EXCISION Right 1980's   "wrist" (08-25-2012)   JOINT REPLACEMENT     KNEE LIGAMENT  RECONSTRUCTION Right 1980's   TONSILLECTOMY  ~ [facility] ARTHROPLASTY Left 08/03/2012   TOTAL HIP ARTHROPLASTY Left 08/03/2012   Procedure: TOTAL HIP ARTHROPLASTY;  Surgeon: Garald Balding, MD;  Location: [facility];  Service: Orthopedics;  Laterality: Left;  Left Total Hip Arthroplasty   TOTAL HIP ARTHROPLASTY Left 08/28/2012   Procedure: Irrigation and Debridement hip ;  Surgeon: Mcarthur Rossetti, MD;  Location: [facility];  Service: Orthopedics;  Laterality: Left;   TOTAL KNEE ARTHROPLASTY Left 2001    TOTAL KNEE ARTHROPLASTY Right 2004     OB History   No obstetric history on file.     Family History  Problem Relation Age of Onset   Arthritis Mother    Arthritis Father    Colon cancer Father    Diabetes Sister    Arthritis Sister    Diabetes Maternal Grandmother    Arthritis Maternal Grandmother    Diabetes Maternal Grandfather    Arthritis Maternal Grandfather     Social History   Tobacco Use   Smoking status: Former    Packs/day: 0.12    Years: 2.00    Pack years: 0.24    Types: Cigarettes   Smokeless tobacco: Never   Tobacco comments:    08/03/2012 "quit 30 years ago"  Vaping Use   Vaping Use: Never used  Substance Use Topics   Alcohol use: No    Alcohol/week: 0.0 standard drinks    Comment: 08/03/2012 "used to be a beeralcoholic"; stopped ~ 30 yr ago"   Drug use: No    Home Medications Prior to Admission medications   Medication Sig Start Date End Date Taking? Authorizing Provider  albuterol (PROVENTIL HFA) 108 (90 Base) MCG/ACT inhaler Inhale 1 puff into the lungs every 6 (six) hours as needed for shortness of breath or wheezing. 11/01/20   Nche, Charlene Brooke, NP  albuterol (VENTOLIN HFA) 108 (90 Base) MCG/ACT inhaler INHALE 1-2 PUFFS BY MOUTH EVERY 6 HOURS AS NEEDED FOR WHEEZE OR SHORTNESS OF BREATH Patient not taking: No sig reported 10/30/20   Nche, Charlene Brooke, NP  albuterol (VENTOLIN HFA) 108 (90 Base) MCG/ACT inhaler Inhale into the lungs every 6 (six) hours as needed for wheezing or shortness of breath.    [provider]  allopurinol (ZYLOPRIM) 100 MG tablet Take 1 tablet (100 mg total) by mouth daily. 10/30/20   Nche, Charlene Brooke, NP  aspirin EC 81 MG tablet Take 81 mg by mouth daily.    [provider]  atorvastatin (LIPITOR) 10 MG tablet Take 1 tablet (10 mg total) by mouth daily at 6 PM. 10/30/20   Nche, Charlene Brooke, NP  Carboxymethylcellulose Sodium (EYE DROPS OP) Apply to eye. Lubricant eye drops    [provider]   cetirizine (ZYRTEC) 10 MG tablet Take 10 mg by mouth daily as needed.    [provider]  cholecalciferol (VITAMIN D3) 25 MCG (1000 UNIT) tablet Take 1,000 Units by mouth daily.    [provider]  cycloSPORINE (RESTASIS) 0.05 % ophthalmic emulsion Restasis 0.05 % eye drops in a dropperette    [provider]  dicyclomine (BENTYL) 20 MG tablet Take 1 tablet (20 mg total) by mouth 2 (two) times daily. 01/05/21   Tedd Sias, PA  lisinopril (ZESTRIL) 20 MG tablet Take 1 tablet (20 mg total) by mouth daily. 10/30/20   Nche, Charlene Brooke, NP  Multiple Vitamin (MULTIVITAMIN) tablet Take 1 tablet by mouth daily.    [provider]  nitrofurantoin, macrocrystal-monohydrate, (MACROBID) 100 MG capsule Take 1 capsule (100 mg total) by mouth 2 (two) times daily. 01/05/21   Tedd Sias, PA  omeprazole (PRILOSEC) 20 MG capsule TAKE 1 CAPSULE BY MOUTH EVERY DAY 10/30/20   Nche, Charlene Brooke, NP  ondansetron (ZOFRAN ODT) 4 MG disintegrating tablet Take 1 tablet (4 mg total) by mouth every 8 (eight) hours as needed for nausea or vomiting. 01/05/21   Tedd Sias, PA  OVER THE COUNTER MEDICATION Cystic magnesium    [provider]  pregabalin (LYRICA) 150 MG capsule Take 1 capsule (150 mg total) by mouth 2 (two) times daily. 08/02/20   Bayard Hugger, NP  Rimegepant Sulfate (NURTEC) 75 MG TBDP Take 75 mg by mouth as needed (take 1 at onset of headache, max is 75 in 24 hours). 10/29/20   Marcial Pacas, MD  Sennosides 8.6 MG CAPS Take 1 capsule by mouth daily.    [provider]  SYMBICORT 160-4.5 MCG/ACT inhaler Inhale 2 puffs into the lungs in the morning and at bedtime. Rinse mouth after each use 10/30/20   Nche, Charlene Brooke, NP  traMADol (ULTRAM) 50 MG tablet Take 1 tablet (50 mg total) by mouth 3 (three) times daily. 08/02/20   Bayard Hugger, NP    Allergies    Cymbalta [duloxetine hcl], Pineapple, Sulfa antibiotics, Influenza vaccines,  Penicillins, and Theophyllines  Review of Systems   Review of Systems  Constitutional:  Negative for chills and fever.  HENT:  Positive for congestion. Negative for ear pain and sore throat.   Eyes:  Negative for pain and visual disturbance.  Respiratory:  Positive for cough. Negative for shortness of breath.   Cardiovascular:  Negative for chest pain and palpitations.  Gastrointestinal:  Positive for diarrhea, nausea and vomiting. Negative for abdominal pain.  Genitourinary:  Positive for dysuria. Negative for hematuria.  Musculoskeletal:  Negative for arthralgias and back pain.  Skin:  Negative for color change and rash.  Neurological:  Negative for seizures and syncope.  All other systems reviewed and are negative.  Physical Exam Updated Vital Signs BP (!) 158/87   Pulse 76   Temp 98.8 F (37.1 C) (Oral)   Resp 16   Ht 1.549 m (5\' 1" )   Wt 124.7 kg   SpO2 97%   BMI 51.96 kg/m   Physical Exam Vitals and nursing note reviewed.  Constitutional:      General: She is not in acute distress.    Appearance: Normal appearance. She is well-developed.  HENT:     Head: Normocephalic and atraumatic.  Eyes:     Extraocular Movements: Extraocular movements intact.     Conjunctiva/sclera: Conjunctivae normal.     Pupils: Pupils are equal, round, and reactive to light.  Cardiovascular:     Rate and Rhythm: Normal rate and regular rhythm.     Heart sounds: No murmur heard. Pulmonary:     Effort: Pulmonary effort is normal. No respiratory distress.     Breath sounds: Rhonchi present.  Abdominal:     Palpations: Abdomen is soft.     Tenderness: There is no abdominal tenderness.  Musculoskeletal:        General: Normal range of motion.     Cervical back: Normal range of motion and neck supple.  Skin:    General: Skin is warm and dry.     Capillary Refill: Capillary refill takes less than 2 seconds.  Neurological:     General: No focal  deficit present.     Mental Status: She is  alert and oriented to person, place, and time.     Cranial Nerves: No cranial nerve deficit.     Sensory: No sensory deficit.     Motor: No weakness.    ED Results / Procedures / Treatments   Labs (all labs ordered are listed, but only abnormal results are displayed) Labs Reviewed  BASIC METABOLIC PANEL - Abnormal; Notable for the following components:      Result Value   Creatinine, Ser 1.12 (*)    Calcium 8.8 (*)    GFR, Estimated 52 (*)    All other components within normal limits  CBC - Abnormal; Notable for the following components:   Hemoglobin 11.2 (*)    HCT 35.8 (*)    All other components within normal limits  TROPONIN I (HIGH SENSITIVITY)  TROPONIN I (HIGH SENSITIVITY)    EKG EKG Interpretation  Date/Time:  Sunday January 06 2021 12:26:22 EST Ventricular Rate:  72 PR Interval:  146 QRS Duration: 92 QT Interval:  416 QTC Calculation: 455 R Axis:   6 Text Interpretation: Normal sinus rhythm Nonspecific T wave abnormality Abnormal ECG NO SIGNIFICANT CHANGE SINCE LAST TRACING YESTERDAY Confirmed by Fredia Sorrow [phone]) on 01/06/2021 3:30:46 PM  Radiology DG Chest 2 View  Result Date: 01/05/2021 CLINICAL DATA:  Cough, fatigue, vomiting. EXAM: CHEST - 2 VIEW COMPARISON:  Chest radiograph 08/20/2020 FINDINGS: Stable borderline enlarged cardiac silhouette. Lungs are clear. No pleural effusion or pneumothorax. Multilevel degenerative disc disease in the distal thoracic spine. Severe osteoarthritis of the glenohumeral joints. IMPRESSION: No acute cardiopulmonary abnormality. Electronically Signed   By: Ileana Roup M.D.   On: 01/05/2021 11:04   CT ABDOMEN PELVIS W CONTRAST  Result Date: 01/05/2021 CLINICAL DATA:  75 year old female with right lower quadrant abdominal pain EXAM: CT ABDOMEN AND PELVIS WITH CONTRAST TECHNIQUE: Multidetector CT imaging of the abdomen and pelvis was performed using the standard protocol following bolus administration of intravenous  contrast. CONTRAST:  67mL OMNIPAQUE IOHEXOL 300 MG/ML  SOLN COMPARISON:  05/05/2013 FINDINGS: Lower chest: No acute finding of the lower chest Hepatobiliary: Unremarkable liver. Cholelithiasis without inflammatory changes. Pancreas: Unremarkable Spleen: Unremarkable Adrenals/Urinary Tract: - Right adrenal gland:  Unremarkable - Left adrenal gland: Unremarkable. - Right kidney: No hydronephrosis, nephrolithiasis, inflammation, or ureteral dilation. Cyst on the inferolateral cortex of the right kidney. - Left Kidney: No hydronephrosis, nephrolithiasis, inflammation, or ureteral dilation. Renal cortical thinning. There is a rounded intermediate density/enhancing structure in the interpolar region of the left kidney, 20 mm. Hounsfield units measure approximately 20-30 on both the early and late phase imaging suggesting complex cyst/calyx. Unremarkable course of the left ureter. - Urinary Bladder: Urinary bladder is relatively decompressed though not well visualized secondary to streak artifact in the pelvis. Stomach/Bowel: - Stomach: Hiatal hernia with otherwise unremarkable stomach. - Small bowel: Unremarkable - Appendix: Normal. - Colon: Mild stool burden. No inflammatory changes. Diverticular disease of the sigmoid colon with no acute inflammatory changes. Vascular/Lymphatic: Minimal atherosclerosis of the abdominal aorta. No aneurysm. No inflammatory changes. Mesenteric arteries and renal arteries are patent. Iliac arteries and proximal femoral arteries are patent. Unremarkable venous structures. No adenopathy. Reproductive: Surgical changes of hysterectomy. Pelvis not well evaluated secondary to the streak artifact. Other: None Musculoskeletal: Surgical changes of fusion of L1-L2. Focal kyphotic deformity at this site. There is 50% narrowing of the canal at the fused disc space of L1-L2. Advanced degenerative changes of the remainder of the lower thoracic  and the lumbar spine with vacuum disc phenomenon at nearly  all levels. No acute displaced fracture. Surgical changes of left hip arthroplasty. IMPRESSION: Negative for acute finding of the abdomen/pelvis. Aortic Atherosclerosis (ICD10-I70.0). Ancillary findings as above. Electronically Signed   By: Corrie Mckusick D.O.   On: 01/05/2021 13:56    Procedures Procedures   Medications Ordered in ED Medications - No data to display  ED Course  I have reviewed the triage vital signs and the nursing notes.  Pertinent labs & imaging results that were available during my care of the patient were reviewed by me and considered in my medical decision making (see chart for details).    MDM Rules/Calculators/A&P                          Work-up for possible cardiac cause for the chest pain which actually clinically sounds chest wall in nature.  Troponins x2 were negative.  Repeat labs without significant change compared to yesterday.  GFR was 52.  Creatinine of 1.12.  No leukocytosis.  EKG without any acute changes.  Chest x-ray not repeated.  It was normal yesterday.  Patient also had CT scan of her abdomen and pelvis without any acute findings.  That was from yesterday as well  Final Clinical Impression(s) / ED Diagnoses Final diagnoses:  Chest wall pain  Viral illness    Rx / DC Orders ED Discharge Orders     None        Fredia Sorrow, MD 01/06/21 1859

## 2021-01-06 NOTE — Discharge Instructions (Signed)
Work-up today without evidence of any acute cardiac event.  Symptoms seem to be due to chest wall soreness from cough.  Recommend Mucinex DM for cough.  Most likely viral upper respiratory infection based on your work-up from yesterday.  Return for any new or worse symptoms.  Make an appointment follow-up with your doctor.

## 2021-01-06 NOTE — ED Triage Notes (Signed)
Pt arrives pov with c/o non radiating CP and cough , nausea and diarrhea x 5 days. Pt was treated at [facility] ED yesterday. Macy at triage to listen to patient.

## 2021-01-07 LAB — URINE CULTURE: Culture: >=100000 — AB

## 2021-01-09 DIAGNOSIS — G4733 Obstructive sleep apnea (adult) (pediatric): Secondary | ICD-10-CM | POA: Diagnosis not present

## 2021-01-28 ENCOUNTER — Ambulatory Visit (INDEPENDENT_AMBULATORY_CARE_PROVIDER_SITE_OTHER): Payer: Medicare Other | Admitting: Adult Health

## 2021-01-28 ENCOUNTER — Encounter: Payer: Self-pay | Admitting: Adult Health

## 2021-01-28 VITALS — BP 99/60 | HR 83 | Ht 60.0 in | Wt 273.4 lb

## 2021-01-28 DIAGNOSIS — Z9989 Dependence on other enabling machines and devices: Secondary | ICD-10-CM

## 2021-01-28 DIAGNOSIS — G4733 Obstructive sleep apnea (adult) (pediatric): Secondary | ICD-10-CM | POA: Diagnosis not present

## 2021-01-28 DIAGNOSIS — G43009 Migraine without aura, not intractable, without status migrainosus: Secondary | ICD-10-CM | POA: Diagnosis not present

## 2021-01-28 MED ORDER — NURTEC 75 MG PO TBDP: 75.0000 mg | ORAL_TABLET | ORAL | 11 refills | Status: DC | PRN

## 2021-01-28 NOTE — Patient Instructions (Signed)
Continue using CPAP nightly and greater than 4 hours each night Continue Nurtec for abortive therapy for headaches If your symptoms worsen or you develop new symptoms please let us know.

## 2021-01-28 NOTE — Progress Notes (Addendum)
PATIENT: Sandra Brennan DOB: Dec 22, 1945  REASON FOR VISIT: follow up HISTORY FROM: patient  HISTORY OF PRESENT ILLNESS: Today 01/28/21   Sandra Brennan is a 75 year old female with a history of chronic migraine headaches and obstructive sleep apnea on CPAP.  She returns today for follow-up.  She states that she was in the hospital for E. coli and then rehab.  She states during that time she was unable to use the CPAP.  She reports that her migraines have increased in the last month due to the death of her mother and sister.  She states that when she does take Nurtec her headache resolves fairly quickly.  She continues on Lyrica prescribed by PCP.    07/23/20: Sandra Brennan is a 75 year old female with history of chronic migraine headaches and OSA on CPAP.  Has not been able to tolerate Topamax, nortriptyline.  Already on daily Lyrica and tramadol for chronic pain, doses increased to help with general body pain, is helping some. Has been in PT for falling, now using rolling walker. Started getting vibration sensation in hips and legs, goes down to her legs, comes and goes. Seeing pain management, PCP. Headaches are left sided, stabbing, afterwards head is sore to touch, happening once a week. 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 Taking Tylenol Extra Strength, calms it. Feels sensitive to sound with headache. Weight is up 295 lbs, up 8 lbs. Going to [facility] June. Needs to see dentist, has top and bottom set that are poorly fitting. Headaches have improved since last seen.   Update 01/23/2020 SS: Sandra Brennan is a 75 year old female with history of chronic migraine headaches and OSA on CPAP. Could not tolerate Topamax for headaches secondary to fatigue, nortriptyline was discontinued due to possible interaction with tramadol.  Is taking tramadol and Lyrica for chronic pain, fibromyalgia.  Of recent, has been under more stress,  more frequent headaches, once a week, but may last 1-3 days, Tylenol is not helpful. When stress is well controlled, headaches do well.  In the last year, gained about 15 pounds, has not been exercising.  Has asthma, more SOB with exertion, using inhaler.  Headaches are on the left temporal area, caring for her mother, has Alzheimer's, takes care of her grandkids.  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 / Recent CPAP download indicates good compliance, used 29/30 days, 26/30 days greater than 4 hours (87%), average usage days used 6 hours 40 minutes, set pressure 6 cm water, leak 95th percentile 26.7, AHI 4.1. Uses nasal mask, felt to have leaking in tubing, just got new tubing about 2 weeks ago. Still has dry mouth, but sleeps with mouth open. Here today for follow-up unaccompanied.  HISTORY Some 07/19/2019 JM: Sandra Brennan 75 year old female with history of chronic migraine headaches and OSA on CPAP.  When last seen she was started on Topamax.  She reports ongoing headaches approximately 2-3 times monthly typically located left temporal and frontal with pressure type sensation.  At times, incontinent of phonophobia and blurred vision but no other associated features.  She self discontinued Topamax due to increased fatigue and "feeling out of it".  At times, headache will subside with use of Tylenol but other times headaches can be debilitating without benefit of Tylenol.  She does report worsening headaches with increased stressors but at times can occur without aggravating factors.   She endorses ongoing compliance with CPAP for  OSA management with compliance report from 06/18/2019 -[id number] or 30 usage days with 29 days greater than 4 hours for 97% compliance with residual AHI 1.2.  Set pressure at 6 cm H2O with EPR level 1.  Leaks in the 95th percentile 26.5.  She does report frequently feeling leaks coming from her tubing and recently replaced tubing with only short-term benefit.  Also  complains of continued dry mouth and did not increase humidity as previously recommended at prior visit.   REVIEW OF SYSTEMS: Out of a complete 14 system review of symptoms, the patient complains only of the following symptoms, and all other reviewed systems are negative. ESS 10  ALLERGIES: Allergies  Allergen Reactions   Cymbalta [Duloxetine Hcl] Anaphylaxis   Pineapple Shortness Of Breath   Sulfa Antibiotics Hives, Shortness Of Breath and Itching    REACTION: unknown   Influenza Vaccines Hives and Itching   Penicillins Hives   Theophyllines Hives    HOME MEDICATIONS: Outpatient Medications Prior to Visit  Medication Sig Dispense Refill   albuterol (PROVENTIL HFA) 108 (90 Base) MCG/ACT inhaler Inhale 1 puff into the lungs every 6 (six) hours as needed for shortness of breath or wheezing. 1 each 3   albuterol (VENTOLIN HFA) 108 (90 Base) MCG/ACT inhaler INHALE 1-2 PUFFS BY MOUTH EVERY 6 HOURS AS NEEDED FOR WHEEZE OR SHORTNESS OF BREATH 6.7 each 3   albuterol (VENTOLIN HFA) 108 (90 Base) MCG/ACT inhaler Inhale into the lungs every 6 (six) hours as needed for wheezing or shortness of breath.     allopurinol (ZYLOPRIM) 100 MG tablet Take 1 tablet (100 mg total) by mouth daily. 90 tablet 1   aspirin EC 81 MG tablet Take 81 mg by mouth daily.     atorvastatin (LIPITOR) 10 MG tablet Take 1 tablet (10 mg total) by mouth daily at 6 PM. 90 tablet 1   Carboxymethylcellulose Sodium (EYE DROPS OP) Apply to eye. Lubricant eye drops     cetirizine (ZYRTEC) 10 MG tablet Take 10 mg by mouth daily as needed.     cholecalciferol (VITAMIN D3) 25 MCG (1000 UNIT) tablet Take 1,000 Units by mouth daily.     cycloSPORINE (RESTASIS) 0.05 % ophthalmic emulsion Restasis 0.05 % eye drops in a dropperette     dicyclomine (BENTYL) 20 MG tablet Take 1 tablet (20 mg total) by mouth 2 (two) times daily. 20 tablet 0   lisinopril (ZESTRIL) 20 MG tablet Take 1 tablet (20 mg total) by mouth daily. 90 tablet 1   Multiple  Vitamin (MULTIVITAMIN) tablet Take 1 tablet by mouth daily.     nitrofurantoin, macrocrystal-monohydrate, (MACROBID) 100 MG capsule Take 1 capsule (100 mg total) by mouth 2 (two) times daily. 10 capsule 0   omeprazole (PRILOSEC) 20 MG capsule TAKE 1 CAPSULE BY MOUTH EVERY DAY 90 capsule 1   ondansetron (ZOFRAN ODT) 4 MG disintegrating tablet Take 1 tablet (4 mg total) by mouth every 8 (eight) hours as needed for nausea or vomiting. 20 tablet 0   OVER THE COUNTER MEDICATION Cystic magnesium     pregabalin (LYRICA) 150 MG capsule Take 1 capsule (150 mg total) by mouth 2 (two) times daily. 60 capsule 5   Sennosides 8.6 MG CAPS Take 1 capsule by mouth daily.     SYMBICORT 160-4.5 MCG/ACT inhaler Inhale 2 puffs into the lungs in the morning and at bedtime. Rinse mouth after each use 30.6 each 5   traMADol (ULTRAM) 50 MG tablet Take 1 tablet (50 mg total)  by mouth 3 (three) times daily. 90 tablet 5   Rimegepant Sulfate (NURTEC) 75 MG TBDP Take 75 mg by mouth as needed (take 1 at onset of headache, max is 75 in 24 hours). 30 tablet 0   No facility-administered medications prior to visit.    PAST MEDICAL HISTORY: Past Medical History:  Diagnosis Date   Acute posthemorrhagic anemia 09/06/2012   Anemia    Arthritis    "plenty" (18-Aug-2012)   Asthma    Chest pain 03/30/2016   Chronic lower back pain    "they say I need a whole new spinal column" (2012/08/18)   COPD (chronic obstructive pulmonary disease) (HCC)    Degenerative arthritis    "all over" (08/18/12)   Fx ankle    GERD (gastroesophageal reflux disease)    Gout 05/05/2013   Hypertension    Migraines    "used to; totally stopped when I quit drinking 30 yr ago" (2012-08-18)   Myalgia and myositis    Myocardial infarction [facility]) 1992   08-18-2012 "mild MI when son died "   Obesity    Osteoporosis    "all over" (08-18-2012)   Shortness of breath    when stressed.   Sleep apnea    "never did fix my machine; haven't used one for 5 years; I've lost  116# since then; no problems now" (18-Aug-2012)   Type II diabetes mellitus ([place])    "borderline; don't test; take Metformin" (2012/08/18)    PAST SURGICAL HISTORY: Past Surgical History:  Procedure Laterality Date   ABDOMINAL HYSTERECTOMY  1990's   GANGLION CYST EXCISION Right 1980's   "wrist" (2012-08-18)   JOINT REPLACEMENT     KNEE LIGAMENT RECONSTRUCTION Right 1980's   TONSILLECTOMY  ~ [place] Left Aug 18, 2012   TOTAL HIP ARTHROPLASTY Left 08/18/12   Procedure: TOTAL HIP ARTHROPLASTY;  Surgeon: Garald Balding, MD;  Location: [place];  Service: Orthopedics;  Laterality: Left;  Left Total Hip Arthroplasty   TOTAL HIP ARTHROPLASTY Left 08/28/2012   Procedure: Irrigation and Debridement hip ;  Surgeon: Mcarthur Rossetti, MD;  Location: [place];  Service: Orthopedics;  Laterality: Left;   TOTAL KNEE ARTHROPLASTY Left 2001   TOTAL KNEE ARTHROPLASTY Right 2004    FAMILY HISTORY: Family History  Problem Relation Age of Onset   Arthritis Mother    Arthritis Father    Colon cancer Father    Diabetes Sister    Arthritis Sister    Diabetes Maternal Grandmother    Arthritis Maternal Grandmother    Diabetes Maternal Grandfather    Arthritis Maternal Grandfather    Migraines Neg Hx    Sleep apnea Neg Hx     SOCIAL HISTORY: Social History   Socioeconomic History   Marital status: Widowed    Spouse name: Not on file   Number of children: 5   Years of education: PhD   Highest education level: Not on file  Occupational History   Occupation: Retired  Tobacco Use   Smoking status: Former    Packs/day: 0.12    Years: 2.00    Pack years: 0.24    Types: Cigarettes   Smokeless tobacco: Never   Tobacco comments:    08-18-2012 "quit 30 years ago"  Vaping Use   Vaping Use: Never used  Substance and Sexual Activity   Alcohol use: No    Alcohol/week: 0.0 standard drinks    Comment: 08/18/12 "used to be a beeralcoholic"; stopped ~ 30 yr ago"  Drug use: No    Sexual activity: Never  Other Topics Concern   Not on file  Social History Narrative   Lives at home with her mother and caregiver.   Occasional use of caffeine.   Right-handed.   Social Determinants of Health   Financial Resource Strain: Low Risk    Difficulty of Paying Living Expenses: Not hard at all  Food Insecurity: No Food Insecurity   Worried About Charity fundraiser in the Last Year: Never true   [place] in the Last Year: Never true  Transportation Needs: No Transportation Needs   Lack of Transportation (Medical): No   Lack of Transportation (Non-Medical): No  Physical Activity: Inactive   Days of Exercise per Week: 0 days   Minutes of Exercise per Session: 0 min  Stress: No Stress Concern Present   Feeling of Stress : Not at all  Social Connections: Moderately Integrated   Frequency of Communication with Friends and Family: Three times a week   Frequency of Social Gatherings with Friends and Family: Once a week   Attends Religious Services: More than 4 times per year   Active Member of [employer] or Organizations: Yes   Attends Music therapist: More than 4 times per year   Marital Status: Divorced  Human resources officer Violence: Not At Risk   Fear of Current or Ex-Partner: No   Emotionally Abused: No   Physically Abused: No   Sexually Abused: No   PHYSICAL EXAM  Vitals:   01/28/21 0840  BP: 99/60  Pulse: 83  Weight: 273 lb 6.4 oz (124 kg)  Height: 5' (1.524 m)   Body mass index is 53.39 kg/m.  Generalized: Well developed, in no acute distress, is obese Neurological examination  Mentation: Alert oriented to time, place, history taking. Follows all commands speech and language fluent Cranial nerve II-XII: Pupils were equal round reactive to light. Extraocular movements were full, visual field were full on confrontational test. Facial sensation and strength were normal. Head turning and shoulder shrug  were normal and symmetric. Motor: Strength  is intact in all extremities Sensory: Sensory testing is intact to soft touch on all 4 extremities. No evidence of extinction is noted.  Coordination: Cerebellar testing reveals good finger-nose-finger and heel-to-shin bilaterally.  Gait and station: Gait is wide-based, using walker  DIAGNOSTIC DATA (LABS, IMAGING, TESTING) - I reviewed patient records, labs, notes, testing and imaging myself where available.  Lab Results  Component Value Date   WBC 7.9 01/06/2021   HGB 11.2 (L) 01/06/2021   HCT 35.8 (L) 01/06/2021   MCV 89.5 01/06/2021   PLT 218 01/06/2021      Component Value Date/Time   NA 140 01/06/2021 1511   NA 142 07/14/2014 1014   K 3.7 01/06/2021 1511   CL 104 01/06/2021 1511   CO2 25 01/06/2021 1511   GLUCOSE 86 01/06/2021 1511   BUN 13 01/06/2021 1511   BUN 12 07/14/2014 1014   CREATININE 1.12 (H) 01/06/2021 1511   CREATININE 1.39 (H) 04/09/2018 1549   CALCIUM 8.8 (L) 01/06/2021 1511   PROT 7.3 01/05/2021 0817   PROT 6.9 07/14/2014 1014   ALBUMIN 3.4 (L) 01/05/2021 0817   ALBUMIN 4.3 07/14/2014 1014   AST 26 01/05/2021 0817   ALT 12 01/05/2021 0817   ALKPHOS 84 01/05/2021 0817   BILITOT 1.0 01/05/2021 0817   BILITOT 0.2 07/14/2014 1014   GFRNONAA 52 (L) 01/06/2021 1511   GFRAA 46 (L) 03/07/2019 2115  Lab Results  Component Value Date   CHOL 129 02/28/2019   HDL 59.80 02/28/2019   LDLCALC 58 02/28/2019   TRIG 57.0 02/28/2019   CHOLHDL 2 02/28/2019   Lab Results  Component Value Date   HGBA1C 6.4 (A) 10/03/2020   HGBA1C 6.4 10/03/2020   Lab Results  Component Value Date   FBPPHKFE76 147 08/21/2020   Lab Results  Component Value Date   TSH 2.80 02/26/2017   ASSESSMENT AND PLAN 75 y.o. year old female  has a past medical history of Acute posthemorrhagic anemia (09/06/2012), Anemia, Arthritis, Asthma, Chest pain (03/30/2016), Chronic lower back pain, COPD (chronic obstructive pulmonary disease) ([place]), Degenerative arthritis, Fx ankle, GERD  (gastroesophageal reflux disease), Gout (05/05/2013), Hypertension, Migraines, Myalgia and myositis, Myocardial infarction ([place]) (1992), Obesity, Osteoporosis, Shortness of breath, Sleep apnea, and Type II diabetes mellitus ([place]). here with:  1.  Migraine headache  -Currently on Lyrica (prescribed by PCP) -Nurtec for abortive therapy  2.  OSA on CPAP   -- Continue CPAP --Good compliance --Good treatment of apnea --Encouraged patient to continue using nightly and greater than 4 hours each night  Follow-up in 1 year or sooner if needed  Ward Givens, AGNP-C 01/28/2021, 9:04 AM [facility] [address], [place], [place] [address] [phone]

## 2021-02-01 ENCOUNTER — Other Ambulatory Visit: Payer: Self-pay

## 2021-02-01 ENCOUNTER — Encounter: Payer: Medicare Other | Attending: Physical Medicine & Rehabilitation | Admitting: Physical Medicine & Rehabilitation

## 2021-02-01 ENCOUNTER — Encounter: Payer: Self-pay | Admitting: Physical Medicine & Rehabilitation

## 2021-02-01 DIAGNOSIS — M48061 Spinal stenosis, lumbar region without neurogenic claudication: Secondary | ICD-10-CM | POA: Diagnosis not present

## 2021-02-01 MED ORDER — TRAMADOL HCL 50 MG PO TABS
50.0000 mg | ORAL_TABLET | Freq: Three times a day (TID) | ORAL | 5 refills | Status: DC
Start: 2021-02-01 — End: 2021-05-31

## 2021-02-01 MED ORDER — PREGABALIN 150 MG PO CAPS: 150.0000 mg | ORAL_CAPSULE | Freq: Two times a day (BID) | ORAL | 5 refills | Status: DC

## 2021-02-01 NOTE — Progress Notes (Signed)
Subjective:    Patient ID: Sandra Brennan, female    DOB: 09-29-1945, 75 y.o.   MRN: [id number]  HPI Fall in June resulting in hospital admission and SNF stay , no post hospital  home health PT, c/o multiple falls at home but no sig injuries . Amb with rollator Lyrica increased to 150mg  BID Tramadol increased to 100mg   Multiple family deaths recently   Reviewed CT Head , neck and lumbar- main finding lumbar stenosis  Pain Inventory Average Pain 8 Pain Right Now 8 My pain is sharp, stabbing, and aching  In the last 24 hours, has pain interfered with the following? General activity 5 Relation with others 5 Enjoyment of life 8 What TIME of day is your pain at its worst? morning  and night Sleep (in general) Fair  Pain is worse with: walking, inactivity, and some activites Pain improves with: rest and medication Relief from Meds: 8  Family History  Problem Relation Age of Onset   Arthritis Mother    Arthritis Father    Colon cancer Father    Diabetes Sister    Arthritis Sister    Diabetes Maternal Grandmother    Arthritis Maternal Grandmother    Diabetes Maternal Grandfather    Arthritis Maternal Grandfather    Migraines Neg Hx    Sleep apnea Neg Hx    Social History   Socioeconomic History   Marital status: Widowed    Spouse name: Not on file   Number of children: 5   Years of education: PhD   Highest education level: Not on file  Occupational History   Occupation: Retired  Tobacco Use   Smoking status: Former    Packs/day: 0.12    Years: 2.00    Pack years: 0.24    Types: Cigarettes   Smokeless tobacco: Never   Tobacco comments:    08/03/2012 "quit 30 years ago"  Vaping Use   Vaping Use: Never used  Substance and Sexual Activity   Alcohol use: No    Alcohol/week: 0.0 standard drinks    Comment: 08/03/2012 "used to be a beeralcoholic"; stopped ~ 30 yr ago"   Drug use: No   Sexual activity: Never  Other Topics Concern   Not on file  Social History  Narrative   Lives at home with her mother and caregiver.   Occasional use of caffeine.   Right-handed.   Social Determinants of Health   Financial Resource Strain: Low Risk    Difficulty of Paying Living Expenses: Not hard at all  Food Insecurity: No Food Insecurity   Worried About Charity fundraiser in the Last Year: Never true   [place] in the Last Year: Never true  Transportation Needs: No Transportation Needs   Lack of Transportation (Medical): No   Lack of Transportation (Non-Medical): No  Physical Activity: Inactive   Days of Exercise per Week: 0 days   Minutes of Exercise per Session: 0 min  Stress: No Stress Concern Present   Feeling of Stress : Not at all  Social Connections: Moderately Integrated   Frequency of Communication with Friends and Family: Three times a week   Frequency of Social Gatherings with Friends and Family: Once a week   Attends Religious Services: More than 4 times per year   Active Member of [employer] or Organizations: Yes   Attends Music therapist: More than 4 times per year   Marital Status: Divorced   Past Surgical History:  Procedure  Laterality Date   ABDOMINAL HYSTERECTOMY  1990's   GANGLION CYST EXCISION Right 1980's   "wrist" (08-19-12)   JOINT REPLACEMENT     KNEE LIGAMENT RECONSTRUCTION Right 1980's   TONSILLECTOMY  ~ [place] Left 2012/08/19   TOTAL HIP ARTHROPLASTY Left 2012/08/19   Procedure: TOTAL HIP ARTHROPLASTY;  Surgeon: Garald Balding, MD;  Location: [facility];  Service: Orthopedics;  Laterality: Left;  Left Total Hip Arthroplasty   TOTAL HIP ARTHROPLASTY Left 08/28/2012   Procedure: Irrigation and Debridement hip ;  Surgeon: Mcarthur Rossetti, MD;  Location: [facility];  Service: Orthopedics;  Laterality: Left;   TOTAL KNEE ARTHROPLASTY Left 2001   TOTAL KNEE ARTHROPLASTY Right 2004   Past Surgical History:  Procedure Laterality Date   ABDOMINAL HYSTERECTOMY  1990's   GANGLION CYST  EXCISION Right 1980's   "wrist" (August 19, 2012)   JOINT REPLACEMENT     KNEE LIGAMENT RECONSTRUCTION Right 1980's   TONSILLECTOMY  ~ [place] Left 08/19/12   TOTAL HIP ARTHROPLASTY Left 19-Aug-2012   Procedure: TOTAL HIP ARTHROPLASTY;  Surgeon: Garald Balding, MD;  Location: [facility];  Service: Orthopedics;  Laterality: Left;  Left Total Hip Arthroplasty   TOTAL HIP ARTHROPLASTY Left 08/28/2012   Procedure: Irrigation and Debridement hip ;  Surgeon: Mcarthur Rossetti, MD;  Location: [facility];  Service: Orthopedics;  Laterality: Left;   TOTAL KNEE ARTHROPLASTY Left 2001   TOTAL KNEE ARTHROPLASTY Right 2004   Past Medical History:  Diagnosis Date   Acute posthemorrhagic anemia 09/06/2012   Anemia    Arthritis    "plenty" (08/19/2012)   Asthma    Chest pain 03/30/2016   Chronic lower back pain    "they say I need a whole new spinal column" (2012-08-19)   COPD (chronic obstructive pulmonary disease) (HCC)    Degenerative arthritis    "all over" (08-19-12)   Fx ankle    GERD (gastroesophageal reflux disease)    Gout 05/05/2013   Hypertension    Migraines    "used to; totally stopped when I quit drinking 30 yr ago" (19-Aug-2012)   Myalgia and myositis    Myocardial infarction [facility]) 1992   August 19, 2012 "mild MI when son died "   Obesity    Osteoporosis    "all over" (19-Aug-2012)   Shortness of breath    when stressed.   Sleep apnea    "never did fix my machine; haven't used one for 5 years; I've lost 116# since then; no problems now" (2012/08/19)   Type II diabetes mellitus ([place])    "borderline; don't test; take Metformin" (Aug 19, 2012)   There were no vitals taken for this visit.  Opioid Risk Score:   Fall Risk Score:  `1  Depression screen PHQ 2/9  Depression screen [facility] 2/9 11/13/2020 10/03/2020 08/07/2020 08/02/2020 08/02/2020 08/23/2019 05/04/2019  Decreased Interest 1 1 0 1 0 0 0  Down, Depressed, Hopeless 0 0 0 1 0 0 0  PHQ - 2 Score 1 1 0 2 0 0 0  Altered sleeping 2 3 - - - - -   Tired, decreased energy 1 0 - - - - -  Change in appetite 1 3 - - - - -  Feeling bad or failure about yourself  0 0 - - - - -  Trouble concentrating 0 0 - - - - -  Moving slowly or fidgety/restless 2 2 - - - - -  Suicidal thoughts 0 0 - - - - -  PHQ-9 Score 7 9 - - - - -  Difficult doing work/chores Somewhat difficult Somewhat difficult - - - - -  Some recent data might be hidden      Review of Systems  Musculoskeletal:  Positive for back pain.       Bilateral shoulder pain Bilateral feet pain Bilateral hip pain going into the thigh  All other systems reviewed and are negative.     Objective:   Physical Exam Vitals and nursing note reviewed.  Constitutional:      Appearance: She is obese.  HENT:     Head: Normocephalic and atraumatic.  Eyes:     Extraocular Movements: Extraocular movements intact.     Conjunctiva/sclera: Conjunctivae normal.     Pupils: Pupils are equal, round, and reactive to light.  Neurological:     General: No focal deficit present.     Mental Status: She is alert and oriented to person, place, and time.     Comments: 5/5 in BLE Sensation normal in BLEs  Psychiatric:        Mood and Affect: Mood normal.        Behavior: Behavior normal.   Reduced ROM lumbar spine with flexion and ext       Assessment & Plan:   Lumbar spinal stenosis with nocturnal sciatic symptoms on RIght side  Right L4-5 Transforaminal ESI in Jan 2023 Cont Lyrica 150mg  BID Tramadol 50mg  TID 2.  Deconditioning s/p hospital stay, hx recurrent falls order HHPT

## 2021-02-05 ENCOUNTER — Telehealth: Payer: Self-pay

## 2021-02-05 NOTE — Progress Notes (Signed)
Chronic Care Management Pharmacy Assistant   Name: Sandra Brennan  MRN: [id number] DOB: 01/15/1946  Chart Review for clinical pharmacist on 02/07/2021 at 3:00 pm.  Conditions to be addressed/monitored: HTN, HLD, DMII, CKD Stage 3, Depression, GERD, Asthma, Osteoarthritis, and Migraine, Sleep apnea,Spondylosis without myelopathy or radiculopathy, Fibromyalgia syndrome, Vitamin D deficiency  Primary concerns for visit include: None   Recent office visits:  11/13/2020 Leroy Kennedy LPN (PCP Office) Medicare Wellness Completed 10/03/2020 Wilfred Lacy NP (PCP) Start Lisinopril 20 mg daily, stop Lisinopril-HCTZ, Return in about 3 months  09/10/2020 Dr. Gena Fray MD (PCP Office) No Medication Changes noted  Recent consult visits:  02/01/2021 Dr. Letta Pate MD (Physical medicine and Rehabilitation) No medication changes noted, Ambulatory referral to home health, Return in 6 weeks 01/28/2021 Ward Givens NP (Neurology) No Medication Changes noted, Follow up in 1 year 08/31/2020 Dr. Reesa Chew MD (Internal Medicine) Unable to see note 08/30/2020 Dr. Reesa Chew MD (Internal Medicine) Unable to see note 08/07/2020 [facility] (Dietician) No medication changes noted  Hospital visits:  Medication Reconciliation was completed by comparing discharge summary, patient's EMR and Pharmacy list, and upon discussion with patient.  Admitted to the hospital on 01/06/2021 due to Chest wall pain. Discharge date was 01/06/2021. Discharged from [place]?Medications Started at [facility] Discharge:?? -started None  Medication Changes at Hospital Discharge: -Changed None  Medications Discontinued at Hospital Discharge: -Stopped None  Medications that remain the same after Hospital Discharge:??  -All other medications will remain the same.    Admitted to the hospital on 01/05/2021 due to Nausea Vomiting and diarrhea . Discharge date was 01/05/2021. Discharged from [place]?Medications Started at [facility] Discharge:?? -started dicyclomine 20 mg 2 times daily, start Zofran 4 mg PRN, start Macrobid 100 mg 2 times daily  Medication Changes at Hospital Discharge: -Changed None  Medications Discontinued at Hospital Discharge: -Stopped None  Medications that remain the same after Hospital Discharge:??  -All other medications will remain the same.    Admitted to the hospital on 08/19/2020 due to Fall. Discharge date was 08/23/2020. Discharged from [place]?Medications Started at [facility] Discharge:?? -started None  Medication Changes at Hospital Discharge: -Changed None  Medications Discontinued at Hospital Discharge: -Stopped Nystatin powder  Medications that remain the same after Hospital Discharge:??  -All other medications will remain the same.    Unable to leave a voice message to do initial question prior to patient appointment on 02/07/2021 for CCM at 3:00 pm with Junius Argyle the Clinical pharmacist.    Medications: Outpatient Encounter Medications as of 02/05/2021  Medication Sig Note   albuterol (PROVENTIL HFA) 108 (90 Base) MCG/ACT inhaler Inhale 1 puff into the lungs every 6 (six) hours as needed for shortness of breath or wheezing.    albuterol (VENTOLIN HFA) 108 (90 Base) MCG/ACT inhaler INHALE 1-2 PUFFS BY MOUTH EVERY 6 HOURS AS NEEDED FOR WHEEZE OR SHORTNESS OF BREATH    albuterol (VENTOLIN HFA) 108 (90 Base) MCG/ACT inhaler Inhale into the lungs every 6 (six) hours as needed for wheezing or shortness of breath.    allopurinol (ZYLOPRIM) 100 MG tablet Take 1 tablet (100 mg total) by mouth daily.    aspirin EC 81 MG tablet Take 81 mg by mouth daily.    atorvastatin (LIPITOR) 10 MG tablet Take 1 tablet (10 mg total) by mouth daily at 6 PM.    Carboxymethylcellulose Sodium (EYE DROPS  OP) Apply to eye. Lubricant eye drops    cetirizine (ZYRTEC) 10 MG tablet Take 10 mg by mouth daily as needed.     cholecalciferol (VITAMIN D3) 25 MCG (1000 UNIT) tablet Take 1,000 Units by mouth daily.    cycloSPORINE (RESTASIS) 0.05 % ophthalmic emulsion Restasis 0.05 % eye drops in a dropperette    dicyclomine (BENTYL) 20 MG tablet Take 1 tablet (20 mg total) by mouth 2 (two) times daily.    lisinopril (ZESTRIL) 20 MG tablet Take 1 tablet (20 mg total) by mouth daily.    Multiple Vitamin (MULTIVITAMIN) tablet Take 1 tablet by mouth daily.    nitrofurantoin, macrocrystal-monohydrate, (MACROBID) 100 MG capsule Take 1 capsule (100 mg total) by mouth 2 (two) times daily.    omeprazole (PRILOSEC) 20 MG capsule TAKE 1 CAPSULE BY MOUTH EVERY DAY    ondansetron (ZOFRAN ODT) 4 MG disintegrating tablet Take 1 tablet (4 mg total) by mouth every 8 (eight) hours as needed for nausea or vomiting.    OVER THE COUNTER MEDICATION Cystic magnesium    pregabalin (LYRICA) 150 MG capsule Take 1 capsule (150 mg total) by mouth 2 (two) times daily.    Rimegepant Sulfate (NURTEC) 75 MG TBDP Take 75 mg by mouth as needed (take 1 at onset of headache, max is 75 in 24 hours).    Sennosides 8.6 MG CAPS Take 1 capsule by mouth daily. 07/12/2018: Take 2 tab daily   SYMBICORT 160-4.5 MCG/ACT inhaler Inhale 2 puffs into the lungs in the morning and at bedtime. Rinse mouth after each use    traMADol (ULTRAM) 50 MG tablet Take 1 tablet (50 mg total) by mouth 3 (three) times daily.    No facility-administered encounter medications on file as of 02/05/2021.    Care Gaps: Shingrix Vaccine Ophthalmology Exam (Last Completed 05/12/2018) Foot Exam (Last Completed 08/23/2019) COVID-19 Vaccine (4- Booster for Moderna series)  Star Rating Drugs: Lisinopril 20 mg last filled 10/03/2020 90 day supply at [facility]. Atorvastatin 10 mg last filled 11/11/2020 90 day supply at [facility].  Medication Fill Gaps: Allopurinol 100 MG last filled 10/27/2020 90 day supply  [facility] Pharmacist Assistant [phone]

## 2021-02-06 ENCOUNTER — Telehealth: Payer: Self-pay

## 2021-02-06 NOTE — Telephone Encounter (Signed)
Blossom Hoops, PT from [facility] called requesting verbal orders for HHPT 2wk1, 1wk4 for strengthening, balance and gait training. Orders approved and given.

## 2021-02-07 ENCOUNTER — Ambulatory Visit (INDEPENDENT_AMBULATORY_CARE_PROVIDER_SITE_OTHER): Payer: Medicare Other

## 2021-02-07 ENCOUNTER — Telehealth: Payer: Self-pay | Admitting: Nurse Practitioner

## 2021-02-07 ENCOUNTER — Other Ambulatory Visit: Payer: Self-pay

## 2021-02-07 DIAGNOSIS — E782 Mixed hyperlipidemia: Secondary | ICD-10-CM

## 2021-02-07 DIAGNOSIS — N1832 Chronic kidney disease, stage 3b: Secondary | ICD-10-CM

## 2021-02-07 DIAGNOSIS — K2100 Gastro-esophageal reflux disease with esophagitis, without bleeding: Secondary | ICD-10-CM

## 2021-02-07 DIAGNOSIS — I10 Essential (primary) hypertension: Secondary | ICD-10-CM

## 2021-02-07 DIAGNOSIS — E1142 Type 2 diabetes mellitus with diabetic polyneuropathy: Secondary | ICD-10-CM

## 2021-02-07 DIAGNOSIS — J4520 Mild intermittent asthma, uncomplicated: Secondary | ICD-10-CM

## 2021-02-07 DIAGNOSIS — G43009 Migraine without aura, not intractable, without status migrainosus: Secondary | ICD-10-CM

## 2021-02-07 NOTE — Telephone Encounter (Signed)
Form placed on provider desk.

## 2021-02-07 NOTE — Telephone Encounter (Signed)
Pt dropped off DMV Handicap form to be filled out by provider. Would like it mailed to her when finished.  WM placed in [name]'s folder up front.

## 2021-02-07 NOTE — Patient Instructions (Addendum)
Visit Information It was great speaking with you today!  Please let me know if you have any questions about our visit.   Goals Addressed             This Visit's Progress    Track and Manage My Blood Pressure-Hypertension       Timeframe:  Long-Range Goal Priority:  High Start Date:  02/08/21                           Expected End Date: 02/08/22                      Follow Up within 90 days   - check blood pressure weekly - write blood pressure results in a log or diary    Why is this important?   You won't feel high blood pressure, but it can still hurt your blood vessels.  High blood pressure can cause heart or kidney problems. It can also cause a stroke.  Making lifestyle changes like losing a little weight or eating less salt will help.  Checking your blood pressure at home and at different times of the day can help to control blood pressure.  If the doctor prescribes medicine remember to take it the way the doctor ordered.  Call the office if you cannot afford the medicine or if there are questions about it.     Notes:         Patient Care Plan: General Pharmacy (Adult)     Problem Identified: [facility] AMB "PATIENT-SPECIFIC PROBLEM"      Long-Range Goal: Patient-Specific Goal   Start Date: 02/28/2021  Expected End Date: 02/28/2022  This Visit's Progress: On track  Priority: High  Note:   Current Barriers:  No barriers noted  Pharmacist Clinical Goal(s):  Patient will maintain control of blood pressure as evidenced by BP less than 140/90  through collaboration with PharmD and provider.   Interventions: 1:1 collaboration with Nche, Charlene Brooke, NP regarding development and update of comprehensive plan of care as evidenced by provider attestation and co-signature Inter-disciplinary care team collaboration (see longitudinal plan of care) Comprehensive medication review performed; medication list updated in electronic medical record  Hypertension (BP goal  <140/90) -Controlled -Current treatment: Lisinopril 20 mg daily -Medications previously tried: NA  -Patient reports that she has has mild swelling of her lips sporadically. She thinks it may be a food allergy, but has not been able to figure out what. Managed with benadryl. Could also be signs of potential ACE-induced angioedema.   -Current home readings: Not monitoring regularly -Denies hypotensive/hypertensive symptoms -Recommend referral to allergy specialist for work-up   -Recommended to continue current medication  Hyperlipidemia: (LDL goal < 100) -Controlled -Current treatment: Atorvastatin 10 mg daily  -Medications previously tried: NA -Recommended to continue current medication  Diabetes (A1c goal <7%) -Controlled -Current medications: None -Medications previously tried: Metformin  -Recommended to continue current medication  Asthma (Goal: control symptoms and prevent exacerbations) -Controlled -Current treatment  Albuterol 1-2 puffs every 6 hours as needed Symbicort 2 puffs twice daily  -Medications previously tried: NA  -Exacerbations requiring treatment in last 6 months: None -Patient reports consistent use of maintenance inhaler -Frequency of rescue inhaler use: 2-3 times weekly, mostly when more active.  -Recommended to continue current medication  GERD (Goal: Prevent Heartburn/Reflux) -Controlled -Current treatment  Omeprazole 20 mg daily  -Medications previously tried: NA -Counseled on trigger avoidance.   -Recommended to  continue current medication  Gout (Goal: Prevent gout flares) -Controlled -Last Gout Flare: 1 Year ago, both feet and legs  -Current treatment  Allopurinol 100 mg daily  -Medications previously tried: NA  -We discussed:  Counseled patient on low purine diet plan. Counseled patient to reduce consumption of high-fructose corn syrup, sweetened soft drinks, fruit juices, meat, and seafood. -Recommended to continue current  medication  Chronic Pain (Goal: Minimize pain symptoms) -Controlled -Current treatment  Pregabalin 150 mg twice daily  Tramadol 50 mg three times daily  -Medications previously tried: NA  -Pain 8 or 9 without medicine. 5 or 6.  -Recommended to continue current medication  Migraines (Goal: Prevent migraines) -Controlled -Last month has had 3 migraines requiring treatment.  -Current treatment  Rimepgepant 75 mg once daily as needed for headaches  -Medications previously tried: NA  -Recommended to continue current medication  Chronic Kidney Disease Stage 3b  -All medications assessed for renal dosing and appropriateness in chronic kidney disease. -Recommended to continue current medication  Patient Goals/Self-Care Activities Patient will:  - check blood pressure weekly, document, and provide at future appointments  Follow Up Plan: Telephone follow up appointment with care management team member scheduled for:  05/02/2021 at 3:00 PM    Ms. Nicastro was given information about Chronic Care Management services today including:  CCM service includes personalized support from designated clinical staff supervised by her physician, including individualized plan of care and coordination with other care providers 24/7 contact phone numbers for assistance for urgent and routine care needs. Standard insurance, coinsurance, copays and deductibles apply for chronic care management only during months in which we provide at least 20 minutes of these services. Most insurances cover these services at 100%, however patients may be responsible for any copay, coinsurance and/or deductible if applicable. This service may help you avoid the need for more expensive face-to-face services. Only one practitioner may furnish and bill the service in a calendar month. The patient may stop CCM services at any time (effective at the end of the month) by phone call to the office staff.  Patient agreed to services and  verbal consent obtained.   The patient verbalized understanding of instructions, educational materials, and care plan provided today and declined offer to receive copy of patient instructions, educational materials, and care plan.   Junius Argyle, PharmD, Para [place], CPP Clinical Pharmacist Practitioner  [facility]  [phone]

## 2021-02-07 NOTE — Progress Notes (Signed)
Chronic Care Management Pharmacy Note  02/28/2021 Name:  Sandra Brennan MRN:  [id number] DOB:  [birth year]  Summary: Patient presents for initial CCM consult. She has been dealing with significant grief and stress following the recent loss of her mother and sister.   -Patient reports that she has has mild swelling of her lips sporadically. She thinks it may be a food allergy, but has not been able to figure out what. Managed with benadryl. Could also be signs of potential ACE-induced angioedema.    Recommendations/Changes made from today's visit: Recommend allergist work-up to determine if symptoms are angioedema vs food allergy.   Plan: CPP follow-up 3 months  Subjective: Sandra Brennan is an 75 y.o. year old female who is a primary patient of Nche, Charlene Brooke, NP.  The CCM team was consulted for assistance with disease management and care coordination needs.    Engaged with patient by telephone for initial visit in response to provider referral for pharmacy case management and/or care coordination services.   Consent to Services:  The patient was given the following information about Chronic Care Management services today, agreed to services, and gave verbal consent: 1. CCM service includes personalized support from designated clinical staff supervised by the primary care provider, including individualized plan of care and coordination with other care providers 2. 24/7 contact phone numbers for assistance for urgent and routine care needs. 3. Service will only be billed when office clinical staff spend 20 minutes or more in a month to coordinate care. 4. Only one practitioner may furnish and bill the service in a calendar month. 5.The patient may stop CCM services at any time (effective at the end of the month) by phone call to the office staff. 6. The patient will be responsible for cost sharing (co-pay) of up to 20% of the service fee (after annual deductible is met). Patient agreed  to services and consent obtained.  Patient Care Team: Nche, Charlene Brooke, NP as PCP - General (Internal Medicine) Garald Balding, MD as Consulting Physician (Orthopedic Surgery) Germaine Pomfret, [facility] as Pharmacist (Pharmacist)  Recent office visits: 11/13/2020 Leroy Kennedy LPN (PCP Office) Medicare Wellness Completed 10/03/2020 Wilfred Lacy NP (PCP) Start Lisinopril 20 mg daily, stop Lisinopril-HCTZ, Return in about 3 months  09/10/2020 Dr. Gena Fray MD (PCP Office) No Medication Changes noted  Recent consult visits: 02/01/2021 Dr. Letta Pate MD (Physical medicine and Rehabilitation) No medication changes noted, Ambulatory referral to home health, Return in 6 weeks 01/28/2021 Ward Givens NP (Neurology) No Medication Changes noted, Follow up in 1 year 08/31/2020 Dr. Reesa Chew MD (Internal Medicine) Unable to see note 08/30/2020 Dr. Reesa Chew MD (Internal Medicine) Unable to see note 08/07/2020 Ber[facility] Last ED (Dietician) No medication changes noted  Hospital visits:  Admitted to the hospital on 01/06/2021 due to Chest wall pain. Discharge date was 01/06/2021. Discharged from [place]?Medications Started at [facility] Discharge:?? -started None   Medication Changes at Hospital Discharge: -Changed None   Medications Discontinued at Hospital Discharge: -Stopped None   Medications that remain the same after Hospital Discharge:??  -All other medications will remain the same.     Admitted to the hospital on 01/05/2021 due to Nausea Vomiting and diarrhea . Discharge date was 01/05/2021. Discharged from [place]?Medications Started at [facility] Discharge:?? -started dicyclomine 20 mg 2 times daily, start Zofran 4 mg PRN, start Macrobid 100 mg 2 times daily   Medication Changes  at Hospital Discharge: -Changed None   Medications Discontinued at Hospital Discharge: -Stopped None   Medications that remain the same after Hospital  Discharge:??  -All other medications will remain the same.   Objective:  Lab Results  Component Value Date   CREATININE 1.12 (H) 01/06/2021   BUN 13 01/06/2021   GFR 40.49 (L) 09/10/2020   GFRNONAA 52 (L) 01/06/2021   GFRAA 46 (L) 03/07/2019   NA 140 01/06/2021   K 3.7 01/06/2021   CALCIUM 8.8 (L) 01/06/2021   CO2 25 01/06/2021   GLUCOSE 86 01/06/2021    Lab Results  Component Value Date/Time   HGBA1C 6.4 (A) 10/03/2020 09:31 AM   HGBA1C 6.4 10/03/2020 09:31 AM   HGBA1C 6.2 03/26/2020 02:15 PM   HGBA1C 5.9 08/23/2019 09:59 AM   GFR 40.49 (L) 09/10/2020 12:03 PM   GFR 46.52 (L) 03/26/2020 02:15 PM   MICROALBUR <0.7 03/26/2020 02:15 PM   MICROALBUR <0.7 08/31/2017 11:08 AM    Last diabetic Eye exam:  Lab Results  Component Value Date/Time   HMDIABEYEEXA No Retinopathy 05/12/2018 12:00 AM    Last diabetic Foot exam: No results found for: HMDIABFOOTEX   Lab Results  Component Value Date   CHOL 129 02/28/2019   HDL 59.80 02/28/2019   LDLCALC 58 02/28/2019   TRIG 57.0 02/28/2019   CHOLHDL 2 02/28/2019    Hepatic Function Latest Ref Rng & Units 01/05/2021 09/10/2020 08/21/2020  Total Protein 6.5 - 8.1 g/dL 7.3 6.7 -  Albumin 3.5 - 5.0 g/dL 3.4(L) 3.7 3.2(L)  AST 15 - 41 U/L 26 19 -  ALT 0 - 44 U/L 12 12 -  Alk Phosphatase 38 - 126 U/L 84 83 -  Total Bilirubin 0.3 - 1.2 mg/dL 1.0 0.4 -  Bilirubin, Direct 0.0 - 0.3 mg/dL - - -    Lab Results  Component Value Date/Time   TSH 2.80 02/26/2017 08:38 AM    CBC Latest Ref Rng & Units 01/06/2021 01/05/2021 09/10/2020  WBC 4.0 - 10.5 K/uL 7.9 7.9 6.6  Hemoglobin 12.0 - 15.0 g/dL 11.2(L) 11.4(L) 11.1(L)  Hematocrit 36.0 - 46.0 % 35.8(L) 35.6(L) 34.2(L)  Platelets 150 - 400 K/uL 218 213 212.0    No results found for: VD25OH  Clinical ASCVD: No  The ASCVD Risk score (Arnett DK, et al., 2019) failed to calculate for the following reasons:   The valid total cholesterol range is 130 to 320 mg/dL    Depression screen [facility]  2/9 02/07/2021 11/13/2020 10/03/2020  Decreased Interest '2 1 1  ' Down, Depressed, Hopeless 0 0 0  PHQ - 2 Score '2 1 1  ' Altered sleeping '1 2 3  ' Tired, decreased energy 3 1 0  Change in appetite '3 1 3  ' Feeling bad or failure about yourself  0 0 0  Trouble concentrating 0 0 0  Moving slowly or fidgety/restless 0 2 2  Suicidal thoughts 0 0 0  PHQ-9 Score '9 7 9  ' Difficult doing work/chores Somewhat difficult Somewhat difficult Somewhat difficult  Some recent data might be hidden    Social History   Tobacco Use  Smoking Status Former   Packs/day: 0.12   Years: 2.00   Pack years: 0.24   Types: Cigarettes  Smokeless Tobacco Never  Tobacco Comments   08/03/2012 "quit 30 years ago"   BP Readings from Last 3 Encounters:  01/28/21 99/60  01/06/21 (!) 162/95  01/05/21 (!) 147/85   Pulse Readings from Last 3 Encounters:  01/28/21 83  01/06/21  78  01/05/21 77   Wt Readings from Last 3 Encounters:  01/28/21 273 lb 6.4 oz (124 kg)  01/06/21 275 lb (124.7 kg)  01/05/21 275 lb (124.7 kg)   BMI Readings from Last 3 Encounters:  01/28/21 53.39 kg/m  01/06/21 51.96 kg/m  01/05/21 51.96 kg/m    Assessment/Interventions: Review of patient past medical history, allergies, medications, health status, including review of consultants reports, laboratory and other test data, was performed as part of comprehensive evaluation and provision of chronic care management services.   SDOH:  (Social Determinants of Health) assessments and interventions performed: Yes SDOH Interventions    Flowsheet Row Most Recent Value  SDOH Interventions   Financial Strain Interventions Intervention Not Indicated      SDOH Screenings   Alcohol Screen: Low Risk    Last Alcohol Screening Score (AUDIT): 0  Depression (PHQ2-9): Medium Risk   PHQ-2 Score: 9  Financial Resource Strain: Low Risk    Difficulty of Paying Living Expenses: Not hard at all  Food Insecurity: No Food Insecurity   Worried About Paediatric nurse in the Last Year: Never true   Ran Out of Food in the Last Year: Never true  Housing: Low Risk    Last Housing Risk Score: 0  Physical Activity: Inactive   Days of Exercise per Week: 0 days   Minutes of Exercise per Session: 0 min  Social Connections: Moderately Integrated   Frequency of Communication with Friends and Family: Three times a week   Frequency of Social Gatherings with Friends and Family: Once a week   Attends Religious Services: More than 4 times per year   Active Member of [employer] or Organizations: Yes   Attends Music therapist: More than 4 times per year   Marital Status: Divorced  Stress: No Stress Concern Present   Feeling of Stress : Not at all  Tobacco Use: Medium Risk   Smoking Tobacco Use: Former   Smokeless Tobacco Use: Never   Passive Exposure: Not on Pensions consultant Needs: No Transportation Needs   Lack of Transportation (Medical): No   Lack of Transportation (Non-Medical): No    CCM Care Plan  Allergies  Allergen Reactions   Cymbalta [Duloxetine Hcl] Anaphylaxis   Pineapple Shortness Of Breath   Sulfa Antibiotics Hives, Shortness Of Breath and Itching    REACTION: unknown   Influenza Vaccines Hives and Itching   Penicillins Hives   Theophyllines Hives    Medications Reviewed Today     Reviewed by Germaine Pomfret, RPH (Pharmacist) on 02/07/21 at 1610  Med List Status: <None>   Medication Order Taking? Sig Documenting Provider Last Dose Status Informant  albuterol (PROVENTIL HFA) 108 (90 Base) MCG/ACT inhaler [id number] Yes Inhale 1 puff into the lungs every 6 (six) hours as needed for shortness of breath or wheezing. Nche, Charlene Brooke, NP Taking Active   allopurinol (ZYLOPRIM) 100 MG tablet [id number] Yes Take 1 tablet (100 mg total) by mouth daily. Flossie Buffy, NP Taking Active   aspirin EC 81 MG tablet [id number] Yes Take 81 mg by mouth daily. [provider] Taking Active Self  atorvastatin  (LIPITOR) 10 MG tablet [id number] Yes Take 1 tablet (10 mg total) by mouth daily at 6 PM. Nche, Charlene Brooke, NP Taking Active   Carboxymethylcellulose Sodium (EYE DROPS OP) [id number] Yes Apply to eye. Lubricant eye drops [provider] Taking Active Self  cetirizine (ZYRTEC) 10 MG tablet [id number] Yes Take 10 mg by  mouth daily as needed. [provider] Taking Active Self  cholecalciferol (VITAMIN D3) 25 MCG (1000 UNIT) tablet [id number]  Take 1,000 Units by mouth daily. [provider]  Active Self  dextromethorphan-guaiFENesin (MUCINEX DM) 30-600 MG 12hr tablet [id number] Yes Take 1 tablet by mouth 2 (two) times daily. [provider] Taking Active   lisinopril (ZESTRIL) 20 MG tablet [id number] Yes Take 1 tablet (20 mg total) by mouth daily. Flossie Buffy, NP Taking Active   Multiple Vitamin (MULTIVITAMIN) tablet [id number] Yes Take 1 tablet by mouth daily. [provider] Taking Active Self  omeprazole (PRILOSEC) 20 MG capsule [id number] Yes TAKE 1 CAPSULE BY MOUTH EVERY DAY Nche, Charlene Brooke, NP Taking Active   pregabalin (LYRICA) 150 MG capsule [id number] Yes Take 1 capsule (150 mg total) by mouth 2 (two) times daily. Kirsteins, Luanna Salk, MD Taking Active   Rimegepant Sulfate (NURTEC) 75 MG TBDP [id number] Yes Take 75 mg by mouth as needed (take 1 at onset of headache, max is 75 in 24 hours). Ward Givens, NP Taking Active   Sennosides 8.6 MG CAPS [id number] Yes Take 1 capsule by mouth daily. [provider] Taking Active Self           Med Note Germaine Pomfret   Thu Feb 07, 2021  4:04 PM)    SYMBICORT 160-4.5 MCG/ACT inhaler [id number] Yes Inhale 2 puffs into the lungs in the morning and at bedtime. Rinse mouth after each use Nche, Charlene Brooke, NP Taking Active   traMADol (ULTRAM) 50 MG tablet [id number] Yes Take 1 tablet (50 mg total) by mouth 3 (three) times daily. Charlett Blake, MD Taking Active             Patient  Active Problem List   Diagnosis Date Noted   CKD (chronic kidney disease) stage 3, GFR 30-59 ml/min ([place]) 08/20/2020   Morbid obesity with BMI of 60.0-69.9, adult ([place]) 08/20/2020   Knee pain 08/20/2020   Intertrigo 03/27/2020   Normocytic anemia 08/23/2019   Polyethylene liner wear following total hip arthroplasty requiring isolated polyethylene liner exchange ([place]) 07/12/2019   Depression, recurrent ([place]) 07/12/2018   Migraine without aura and without status migrainosus, not intractable 09/10/2017   Post-traumatic osteoarthritis of right ankle 06/09/2016   Spondylosis without myelopathy or radiculopathy, lumbar region 06/09/2016   Cervical spinal stenosis 06/09/2016   Spinal stenosis, lumbar region, without neurogenic claudication 06/09/2016   Chronic gouty arthritis 06/04/2016   Vitamin D deficiency 06/02/2016   Hyperlipidemia 06/02/2016   Closed right ankle fracture 05/01/2016   Colon polyps 05/01/2016   Fibromyalgia syndrome 05/01/2016   Syncope 03/30/2016   Fall 03/30/2016   GERD with esophagitis 06/21/2015   Pharyngeal dysphagia 06/21/2015   Obesity 05/06/2013   Acute nontraumatic kidney injury ([place]) 05/05/2013   Osteoarthritis of left hip 08/05/2012   Type 2 diabetes mellitus with diabetic polyneuropathy, without long-term current use of insulin ([place]) 08/05/2012   Hypertension 08/05/2012   Asthma, chronic 08/05/2012   Sleep apnea 08/05/2012    Immunization History  Administered Date(s) Administered   Moderna Sars-Covid-2 Vaccination 05/09/2019, 06/10/2019, 09/05/2020   Pneumococcal Conjugate-13 06/02/2016   Pneumococcal Polysaccharide-23 08/31/2017    Conditions to be addressed/monitored:  Hypertension, Hyperlipidemia, Diabetes, GERD, Asthma, Chronic Kidney Disease, Osteoarthritis, Gout, and Migraines  Care Plan : General Pharmacy (Adult)  Updates made by Germaine Pomfret, [place] since 02/28/2021 12:00 AM     Problem: CHL AMB "PATIENT-SPECIFIC PROBLEM"       Long-Range Goal: Patient-Specific  Goal   Start Date: 02/28/2021  Expected End Date: 02/28/2022  This Visit's Progress: On track  Priority: High  Note:   Current Barriers:  No barriers noted  Pharmacist Clinical Goal(s):  Patient will maintain control of blood pressure as evidenced by BP less than 140/90  through collaboration with PharmD and provider.   Interventions: 1:1 collaboration with Nche, Charlene Brooke, NP regarding development and update of comprehensive plan of care as evidenced by provider attestation and co-signature Inter-disciplinary care team collaboration (see longitudinal plan of care) Comprehensive medication review performed; medication list updated in electronic medical record  Hypertension (BP goal <140/90) -Controlled -Current treatment: Lisinopril 20 mg daily -Medications previously tried: NA  -Patient reports that she has has mild swelling of her lips sporadically. She thinks it may be a food allergy, but has not been able to figure out what. Managed with benadryl. Could also be signs of potential ACE-induced angioedema.   -Current home readings: Not monitoring regularly -Denies hypotensive/hypertensive symptoms -Recommend referral to allergy specialist for work-up   -Recommended to continue current medication  Hyperlipidemia: (LDL goal < 100) -Controlled -Current treatment: Atorvastatin 10 mg daily  -Medications previously tried: NA -Recommended to continue current medication  Diabetes (A1c goal <7%) -Controlled -Current medications: None -Medications previously tried: Metformin  -Recommended to continue current medication  Asthma (Goal: control symptoms and prevent exacerbations) -Controlled -Current treatment  Albuterol 1-2 puffs every 6 hours as needed Symbicort 2 puffs twice daily  -Medications previously tried: NA  -Exacerbations requiring treatment in last 6 months: None -Patient reports consistent use of maintenance  inhaler -Frequency of rescue inhaler use: 2-3 times weekly, mostly when more active.  -Recommended to continue current medication  GERD (Goal: Prevent Heartburn/Reflux) -Controlled -Current treatment  Omeprazole 20 mg daily  -Medications previously tried: NA -Counseled on trigger avoidance.   -Recommended to continue current medication  Gout (Goal: Prevent gout flares) -Controlled -Last Gout Flare: 1 Year ago, both feet and legs  -Current treatment  Allopurinol 100 mg daily  -Medications previously tried: NA  -We discussed:  Counseled patient on low purine diet plan. Counseled patient to reduce consumption of high-fructose corn syrup, sweetened soft drinks, fruit juices, meat, and seafood. -Recommended to continue current medication  Chronic Pain (Goal: Minimize pain symptoms) -Controlled -Current treatment  Pregabalin 150 mg twice daily  Tramadol 50 mg three times daily  -Medications previously tried: NA  -Pain 8 or 9 without medicine. 5 or 6.  -Recommended to continue current medication  Migraines (Goal: Prevent migraines) -Controlled -Last month has had 3 migraines requiring treatment.  -Current treatment  Rimepgepant 75 mg once daily as needed for headaches  -Medications previously tried: NA  -Recommended to continue current medication  Chronic Kidney Disease Stage 3b  -All medications assessed for renal dosing and appropriateness in chronic kidney disease. -Recommended to continue current medication  Patient Goals/Self-Care Activities Patient will:  - check blood pressure weekly, document, and provide at future appointments  Follow Up Plan: Telephone follow up appointment with care management team member scheduled for:  05/02/2021 at 3:00 PM      Medication Assistance: None required.  Patient affirms current coverage meets needs.  Compliance/Adherence/Medication fill history: Care Gaps: Shingrix Vaccine Ophthalmology Exam (Last Completed 05/12/2018) Foot  Exam (Last Completed 08/23/2019) COVID-19 Vaccine (4- Booster for Moderna series)  Star-Rating Drugs: Lisinopril 20 mg last filled 10/03/2020 90 day supply at [facility]. Atorvastatin 10 mg last filled 11/11/2020 90 day supply at [facility].   Patient's preferred pharmacy  is:  [facility] - [address], [address] [place] [address] Phone: [phone] Fax: [phone]  Uses pill box? Yes Pt endorses 100% compliance  We discussed: Current pharmacy is preferred with insurance plan and patient is satisfied with pharmacy services Patient decided to: Continue current medication management strategy  Care Plan and Follow Up Patient Decision:  Patient agrees to Care Plan and Follow-up.  Plan: Telephone follow up appointment with care management team member scheduled for:  05/02/2021 at 3:00 PM  Junius Argyle, PharmD, [place], CPP Clinical Pharmacist Practitioner  [facility]  [phone]

## 2021-03-02 DIAGNOSIS — E1142 Type 2 diabetes mellitus with diabetic polyneuropathy: Secondary | ICD-10-CM

## 2021-03-02 DIAGNOSIS — I10 Essential (primary) hypertension: Secondary | ICD-10-CM | POA: Diagnosis not present

## 2021-03-02 DIAGNOSIS — E782 Mixed hyperlipidemia: Secondary | ICD-10-CM

## 2021-03-02 DIAGNOSIS — J4520 Mild intermittent asthma, uncomplicated: Secondary | ICD-10-CM | POA: Diagnosis not present

## 2021-03-05 DIAGNOSIS — M1990 Unspecified osteoarthritis, unspecified site: Secondary | ICD-10-CM | POA: Diagnosis not present

## 2021-03-05 DIAGNOSIS — M109 Gout, unspecified: Secondary | ICD-10-CM | POA: Diagnosis not present

## 2021-03-05 DIAGNOSIS — J449 Chronic obstructive pulmonary disease, unspecified: Secondary | ICD-10-CM | POA: Diagnosis not present

## 2021-03-05 DIAGNOSIS — M48061 Spinal stenosis, lumbar region without neurogenic claudication: Secondary | ICD-10-CM | POA: Diagnosis not present

## 2021-03-05 DIAGNOSIS — I10 Essential (primary) hypertension: Secondary | ICD-10-CM | POA: Diagnosis not present

## 2021-03-05 DIAGNOSIS — Z7982 Long term (current) use of aspirin: Secondary | ICD-10-CM | POA: Diagnosis not present

## 2021-03-05 DIAGNOSIS — E119 Type 2 diabetes mellitus without complications: Secondary | ICD-10-CM | POA: Diagnosis not present

## 2021-03-05 DIAGNOSIS — G4730 Sleep apnea, unspecified: Secondary | ICD-10-CM | POA: Diagnosis not present

## 2021-03-05 DIAGNOSIS — D649 Anemia, unspecified: Secondary | ICD-10-CM | POA: Diagnosis not present

## 2021-03-05 DIAGNOSIS — J45909 Unspecified asthma, uncomplicated: Secondary | ICD-10-CM | POA: Diagnosis not present

## 2021-03-12 DIAGNOSIS — D649 Anemia, unspecified: Secondary | ICD-10-CM | POA: Diagnosis not present

## 2021-03-12 DIAGNOSIS — J45909 Unspecified asthma, uncomplicated: Secondary | ICD-10-CM | POA: Diagnosis not present

## 2021-03-12 DIAGNOSIS — I10 Essential (primary) hypertension: Secondary | ICD-10-CM | POA: Diagnosis not present

## 2021-03-12 DIAGNOSIS — J449 Chronic obstructive pulmonary disease, unspecified: Secondary | ICD-10-CM | POA: Diagnosis not present

## 2021-03-12 DIAGNOSIS — M48061 Spinal stenosis, lumbar region without neurogenic claudication: Secondary | ICD-10-CM | POA: Diagnosis not present

## 2021-03-12 DIAGNOSIS — Z7982 Long term (current) use of aspirin: Secondary | ICD-10-CM | POA: Diagnosis not present

## 2021-03-12 DIAGNOSIS — M109 Gout, unspecified: Secondary | ICD-10-CM | POA: Diagnosis not present

## 2021-03-12 DIAGNOSIS — G4730 Sleep apnea, unspecified: Secondary | ICD-10-CM | POA: Diagnosis not present

## 2021-03-12 DIAGNOSIS — M1990 Unspecified osteoarthritis, unspecified site: Secondary | ICD-10-CM | POA: Diagnosis not present

## 2021-03-12 DIAGNOSIS — E119 Type 2 diabetes mellitus without complications: Secondary | ICD-10-CM | POA: Diagnosis not present

## 2021-03-28 ENCOUNTER — Ambulatory Visit: Payer: Medicare Other | Admitting: Physical Medicine & Rehabilitation

## 2021-03-28 DIAGNOSIS — J45909 Unspecified asthma, uncomplicated: Secondary | ICD-10-CM | POA: Diagnosis not present

## 2021-03-28 DIAGNOSIS — M48061 Spinal stenosis, lumbar region without neurogenic claudication: Secondary | ICD-10-CM | POA: Diagnosis not present

## 2021-03-28 DIAGNOSIS — Z7982 Long term (current) use of aspirin: Secondary | ICD-10-CM | POA: Diagnosis not present

## 2021-03-28 DIAGNOSIS — D649 Anemia, unspecified: Secondary | ICD-10-CM | POA: Diagnosis not present

## 2021-03-28 DIAGNOSIS — G4730 Sleep apnea, unspecified: Secondary | ICD-10-CM | POA: Diagnosis not present

## 2021-03-28 DIAGNOSIS — I10 Essential (primary) hypertension: Secondary | ICD-10-CM | POA: Diagnosis not present

## 2021-03-28 DIAGNOSIS — M109 Gout, unspecified: Secondary | ICD-10-CM | POA: Diagnosis not present

## 2021-03-28 DIAGNOSIS — J449 Chronic obstructive pulmonary disease, unspecified: Secondary | ICD-10-CM | POA: Diagnosis not present

## 2021-03-28 DIAGNOSIS — M1990 Unspecified osteoarthritis, unspecified site: Secondary | ICD-10-CM | POA: Diagnosis not present

## 2021-03-28 DIAGNOSIS — E119 Type 2 diabetes mellitus without complications: Secondary | ICD-10-CM | POA: Diagnosis not present

## 2021-03-29 DIAGNOSIS — Z1231 Encounter for screening mammogram for malignant neoplasm of breast: Secondary | ICD-10-CM | POA: Diagnosis not present

## 2021-03-29 LAB — HM MAMMOGRAPHY

## 2021-04-05 ENCOUNTER — Encounter: Payer: Self-pay | Admitting: Nurse Practitioner

## 2021-04-05 ENCOUNTER — Other Ambulatory Visit: Payer: Self-pay

## 2021-04-05 ENCOUNTER — Ambulatory Visit (INDEPENDENT_AMBULATORY_CARE_PROVIDER_SITE_OTHER): Payer: Medicare Other | Admitting: Nurse Practitioner

## 2021-04-05 VITALS — BP 130/88 | HR 78 | Temp 97.4°F | Ht 59.0 in | Wt 272.4 lb

## 2021-04-05 DIAGNOSIS — F339 Major depressive disorder, recurrent, unspecified: Secondary | ICD-10-CM | POA: Diagnosis not present

## 2021-04-05 DIAGNOSIS — I10 Essential (primary) hypertension: Secondary | ICD-10-CM

## 2021-04-05 DIAGNOSIS — M1A00X0 Idiopathic chronic gout, unspecified site, without tophus (tophi): Secondary | ICD-10-CM

## 2021-04-05 DIAGNOSIS — E1142 Type 2 diabetes mellitus with diabetic polyneuropathy: Secondary | ICD-10-CM

## 2021-04-05 DIAGNOSIS — E782 Mixed hyperlipidemia: Secondary | ICD-10-CM | POA: Diagnosis not present

## 2021-04-05 DIAGNOSIS — E559 Vitamin D deficiency, unspecified: Secondary | ICD-10-CM | POA: Diagnosis not present

## 2021-04-05 LAB — LIPID PANEL
Cholesterol: 148 mg/dL (ref 0–200)
HDL: 61.9 mg/dL (ref >39.00)
LDL Cholesterol: 74 mg/dL (ref 0–99)
NonHDL: 86.01
Total CHOL/HDL Ratio: 2
Triglycerides: 62 mg/dL (ref 0.0–149.0)
VLDL: 12.4 mg/dL (ref 0.0–40.0)

## 2021-04-05 LAB — BASIC METABOLIC PANEL
BUN: 12 mg/dL (ref 6–23)
CO2: 31 mEq/L (ref 19–32)
Calcium: 9.4 mg/dL (ref 8.4–10.5)
Chloride: 103 mEq/L (ref 96–112)
Creatinine, Ser: 1.12 mg/dL (ref 0.40–1.20)
GFR: 48.67 mL/min — ABNORMAL LOW (ref >60.00)
Glucose, Bld: 90 mg/dL (ref 70–99)
Potassium: 4.5 mEq/L (ref 3.5–5.1)
Sodium: 141 mEq/L (ref 135–145)

## 2021-04-05 LAB — HEMOGLOBIN A1C: Hgb A1c MFr Bld: 5.7 % (ref 4.6–6.5)

## 2021-04-05 LAB — VITAMIN D 25 HYDROXY (VIT D DEFICIENCY, FRACTURES): VITD: 83.12 ng/mL (ref 30.00–100.00)

## 2021-04-05 LAB — TSH: TSH: 1.59 u[IU]/mL (ref 0.35–5.50)

## 2021-04-05 MED ORDER — LISINOPRIL 20 MG PO TABS: 20.0000 mg | ORAL_TABLET | Freq: Every day | ORAL | 3 refills | Status: DC

## 2021-04-05 MED ORDER — ESCITALOPRAM OXALATE 10 MG PO TABS: 10.0000 mg | ORAL_TABLET | Freq: Every day | ORAL | 5 refills | Status: DC

## 2021-04-05 NOTE — Patient Instructions (Signed)
Start lexapro for depression  Go to lab for blood draw  Managing Depression, Adult Depression is a mental health condition that affects your thoughts, feelings, and actions. Being diagnosed with depression can bring you relief if you did not know why you have felt or behaved a certain way. It could also leave you feeling overwhelmed with uncertainty about your future. Preparing yourself to manage your symptoms can help you feel more positive about your future. How to manage lifestyle changes Managing stress Stress is your body's reaction to life changes and events, both good and bad. Stress can add to your feelings of depression. Learning to manage your stress can help lessen your feelings of depression. Try some of the following approaches to reducing your stress (stress reduction techniques): Listen to music that you enjoy and that inspires you. Try using a meditation app or take a meditation class. Develop a practice that helps you connect with your spiritual self. Walk in nature, pray, or go to a place of worship. Do some deep breathing. To do this, inhale slowly through your nose. Pause at the top of your inhale for a few seconds and then exhale slowly, letting your muscles relax. Practice yoga to help relax and work your muscles. Choose a stress reduction technique that suits your lifestyle and personality. These techniques take time and practice to develop. Set aside 5-15 minutes a day to do them. Therapists can offer training in these techniques. Other things you can do to manage stress include: Keeping a stress diary. Knowing your limits and saying no when you think something is too much. Paying attention to how you react to certain situations. You may not be able to control everything, but you can change your reaction. Adding humor to your life by watching funny films or TV shows. Making time for activities that you enjoy and that relax you.  Medicines Medicines, such as  antidepressants, are often a part of treatment for depression. Talk with your pharmacist or health care provider about all the medicines, supplements, and herbal products that you take, their possible side effects, and what medicines and other products are safe to take together. Make sure to report any side effects you may have to your health care provider. Relationships Your health care provider may suggest family therapy, couples therapy, or individual therapy as part of your treatment. How to recognize changes Everyone responds differently to treatment for depression. As you recover from depression, you may start to: Have more interest in doing activities. Feel less hopeless. Have more energy. Overeat less often, or have a better appetite. Have better mental focus. It is important to recognize if your depression is not getting better or is getting worse. The symptoms you had in the beginning may return, such as: Tiredness (fatigue) or low energy. Eating too much or too little. Sleeping too much or too little. Feeling restless, agitated, or hopeless. Trouble focusing or making decisions. Unexplained physical complaints. Feeling irritable, angry, or aggressive. If you or your family members notice these symptoms coming back, let your health care provider know right away. Follow these instructions at home: Activity  Try to get some form of exercise each day, such as walking, biking, swimming, or lifting weights. Practice stress reduction techniques. Engage your mind by taking a class or doing some volunteer work. Lifestyle Get the right amount and quality of sleep. Cut down on using caffeine, tobacco, alcohol, and other potentially harmful substances. Eat a healthy diet that includes plenty of vegetables, fruits, whole  grains, low-fat dairy products, and lean protein. Do not eat a lot of foods that are high in solid fats, added sugars, or salt (sodium). General instructions Take  over-the-counter and prescription medicines only as told by your health care provider. Keep all follow-up visits as told by your health care provider. This is important. Where to find support Talking to others Friends and family members can be sources of support and guidance. Talk to trusted friends or family members about your condition. Explain your symptoms to them, and let them know that you are working with a health care provider to treat your depression. Tell friends and family members how they also can be helpful. Finances Find appropriate mental health providers that fit with your financial situation. Talk with your health care provider about options to get reduced prices on your medicines. Where to find more information You can find support in your area from: Anxiety and Depression Association of America (ADAA): www.adaa.org Mental Health America: www.mentalhealthamerica.net [employer] on Mental Illness: www.nami.org Contact a health care provider if: You stop taking your antidepressant medicines, and you have any of these symptoms: Nausea. Headache. Light-headedness. Chills and body aches. Not being able to sleep (insomnia). You or your friends and family think your depression is getting worse. Get help right away if: You have thoughts of hurting yourself or others. If you ever feel like you may hurt yourself or others, or have thoughts about taking your own life, get help right away. Go to your nearest emergency department or: Call your local emergency services (911 in the [place] Call a suicide crisis helpline, such as the [facility] at (239)487-1819 or 988 in the [place]. This is open 24 hours a day in the [place] Text the Crisis Text Line at (925)790-1398 (in the [place].). Summary If you are diagnosed with depression, preparing yourself to manage your symptoms is a good way to feel positive about your future. Work with your health care provider on a  management plan that includes stress reduction techniques, medicines (if applicable), therapy, and healthy lifestyle habits. Keep talking with your health care provider about how your treatment is working. If you have thoughts about taking your own life, call a suicide crisis helpline or text a crisis text line. This information is not intended to replace advice given to you by your health care provider. Make sure you discuss any questions you have with your health care provider. Document Revised: 09/12/2020 Document Reviewed: 12/29/2018 Elsevier Patient Education  2022 [employer].

## 2021-04-05 NOTE — Assessment & Plan Note (Addendum)
Controlled Advised to schedule appt with ophthalmology Repeat hgbA1c: 5.7% F/up 91months

## 2021-04-05 NOTE — Progress Notes (Signed)
Subjective:  Patient ID: Sandra Brennan, female    DOB: [birth year]  Age: 76 y.o. MRN: [id number]  CC: Follow-up (6 month f/u on DM, HTN, and cholesterol. /Pt is fasting. )  HPI  Hypertension BP at goal with lisinopril BP Readings from Last 3 Encounters:  04/05/21 130/88  01/28/21 99/60  01/06/21 (!) 162/95   Maintain med dose Repeat BMP  Type 2 diabetes mellitus with diabetic polyneuropathy, without long-term current use of insulin ([place]) Controlled Advised to schedule appt with ophthalmology Repeat hgbA1c: 5.7% F/up 96months  Depression, recurrent ([place]) Worsening due to grief (death of mother and sister within 21months) and son with drug addiction.  Declined referral to psychologist. States she has emotional support from family and church. Agreed to start lexapro 10mg . rx sent F/up in 71month  Vitamin D deficiency Repeat vit. D: normal Maintain OTC MVI with vit. D  Hyperlipidemia Repeat lipid panel: LDL at goal Continue atorvastatin dose Lipid Panel     Component Value Date/Time   CHOL 148 04/05/2021 0954   TRIG 62.0 04/05/2021 0954   HDL 61.90 04/05/2021 0954   CHOLHDL 2 04/05/2021 0954   VLDL 12.4 04/05/2021 0954   LDLCALC 74 04/05/2021 0954    BP Readings from Last 3 Encounters:  04/05/21 130/88  01/28/21 99/60  01/06/21 (!) 162/95    Wt Readings from Last 3 Encounters:  04/05/21 272 lb 6.4 oz (123.6 kg)  01/28/21 273 lb 6.4 oz (124 kg)  01/06/21 275 lb (124.7 kg)    Reviewed past Medical, Social and Family history today.  Outpatient Medications Prior to Visit  Medication Sig Dispense Refill   albuterol (PROVENTIL HFA) 108 (90 Base) MCG/ACT inhaler Inhale 1 puff into the lungs every 6 (six) hours as needed for shortness of breath or wheezing. 1 each 3   aspirin EC 81 MG tablet Take 81 mg by mouth daily.     Carboxymethylcellulose Sodium (EYE DROPS OP) Apply to eye. Lubricant eye drops     cetirizine (ZYRTEC) 10 MG tablet Take 10 mg by mouth daily as  needed.     cholecalciferol (VITAMIN D3) 25 MCG (1000 UNIT) tablet Take 1,000 Units by mouth daily.     dextromethorphan-guaiFENesin (MUCINEX DM) 30-600 MG 12hr tablet Take 1 tablet by mouth 2 (two) times daily.     Multiple Vitamin (MULTIVITAMIN) tablet Take 1 tablet by mouth daily.     omeprazole (PRILOSEC) 20 MG capsule TAKE 1 CAPSULE BY MOUTH EVERY DAY 90 capsule 1   pregabalin (LYRICA) 150 MG capsule Take 1 capsule (150 mg total) by mouth 2 (two) times daily. 60 capsule 5   Rimegepant Sulfate (NURTEC) 75 MG TBDP Take 75 mg by mouth as needed (take 1 at onset of headache, max is 75 in 24 hours). 10 tablet 11   Sennosides 8.6 MG CAPS Take 1 capsule by mouth daily.     SYMBICORT 160-4.5 MCG/ACT inhaler Inhale 2 puffs into the lungs in the morning and at bedtime. Rinse mouth after each use 30.6 each 5   traMADol (ULTRAM) 50 MG tablet Take 1 tablet (50 mg total) by mouth 3 (three) times daily. 90 tablet 5   allopurinol (ZYLOPRIM) 100 MG tablet Take 1 tablet (100 mg total) by mouth daily. 90 tablet 1   atorvastatin (LIPITOR) 10 MG tablet Take 1 tablet (10 mg total) by mouth daily at 6 PM. 90 tablet 1   lisinopril (ZESTRIL) 20 MG tablet Take 1 tablet (20 mg total) by mouth daily. [place]  tablet 1   No facility-administered medications prior to visit.   ROS See HPI  Objective:  BP 130/88 (BP Location: Left Arm, Patient Position: Sitting, Cuff Size: Large)    Pulse 78    Temp (!) 97.4 F (36.3 C) (Temporal)    Ht 4\' 11"  (1.499 m)    Wt 272 lb 6.4 oz (123.6 kg)    SpO2 100%    BMI 55.02 kg/m   Physical Exam Cardiovascular:     Rate and Rhythm: Normal rate and regular rhythm.     Pulses: Normal pulses.     Heart sounds: Normal heart sounds.  Pulmonary:     Effort: Pulmonary effort is normal.     Breath sounds: Normal breath sounds.  Musculoskeletal:     Right lower leg: Edema present.     Left lower leg: Edema present.  Neurological:     Mental Status: She is alert and oriented to person,  place, and time.   Assessment & Plan:  This visit occurred during the SARS-CoV-2 public health emergency.  Safety protocols were in place, including screening questions prior to the visit, additional usage of staff PPE, and extensive cleaning of exam room while observing appropriate contact time as indicated for disinfecting solutions.   Angeli was seen today for follow-up.  Diagnoses and all orders for this visit:  Depression, recurrent ([place]) -     Basic metabolic panel -     escitalopram (LEXAPRO) 10 MG tablet; Take 1 tablet (10 mg total) by mouth daily. -     TSH  Primary hypertension -     Basic metabolic panel -     lisinopril (ZESTRIL) 20 MG tablet; Take 1 tablet (20 mg total) by mouth daily.  Mixed hyperlipidemia -     Lipid panel -     atorvastatin (LIPITOR) 10 MG tablet; Take 1 tablet (10 mg total) by mouth daily at 6 PM.  Vitamin D deficiency -     Vitamin D (25 hydroxy)  Type 2 diabetes mellitus with diabetic polyneuropathy, without long-term current use of insulin ([facility]) -     Hemoglobin A1c  Chronic gouty arthritis -     allopurinol (ZYLOPRIM) 100 MG tablet; Take 1 tablet (100 mg total) by mouth daily.   Problem List Items Addressed This Visit       Cardiovascular and Mediastinum   Hypertension (Chronic)    BP at goal with lisinopril BP Readings from Last 3 Encounters:  04/05/21 130/88  01/28/21 99/60  01/06/21 (!) 162/95   Maintain med dose Repeat BMP      Relevant Medications   lisinopril (ZESTRIL) 20 MG tablet   atorvastatin (LIPITOR) 10 MG tablet   Other Relevant Orders   Basic metabolic panel (Completed)     Endocrine   Type 2 diabetes mellitus with diabetic polyneuropathy, without long-term current use of insulin ([facility]) (Chronic)    Controlled Advised to schedule appt with ophthalmology Repeat hgbA1c: 5.7% F/up 15months      Relevant Medications   escitalopram (LEXAPRO) 10 MG tablet   lisinopril (ZESTRIL) 20 MG tablet   atorvastatin  (LIPITOR) 10 MG tablet   Other Relevant Orders   Hemoglobin A1c (Completed)     Musculoskeletal and Integument   Chronic gouty arthritis   Relevant Medications   allopurinol (ZYLOPRIM) 100 MG tablet     Other   Depression, recurrent ([facility]) - Primary    Worsening due to grief (death of mother and sister within 110months) and son with  drug addiction.  Declined referral to psychologist. States she has emotional support from family and church. Agreed to start lexapro 10mg . rx sent F/up in 38month      Relevant Medications   escitalopram (LEXAPRO) 10 MG tablet   Other Relevant Orders   Basic metabolic panel (Completed)   TSH (Completed)   Hyperlipidemia    Repeat lipid panel: LDL at goal Continue atorvastatin dose Lipid Panel     Component Value Date/Time   CHOL 148 04/05/2021 0954   TRIG 62.0 04/05/2021 0954   HDL 61.90 04/05/2021 0954   CHOLHDL 2 04/05/2021 0954   VLDL 12.4 04/05/2021 0954   LDLCALC 74 04/05/2021 0954        Relevant Medications   lisinopril (ZESTRIL) 20 MG tablet   atorvastatin (LIPITOR) 10 MG tablet   Other Relevant Orders   Lipid panel (Completed)   Vitamin D deficiency    Repeat vit. D: normal Maintain OTC MVI with vit. D      Relevant Orders   Vitamin D (25 hydroxy) (Completed)     Follow-up: Return in about 2 months (around 06/03/2021) for depression.  Wilfred Lacy, NP

## 2021-04-05 NOTE — Assessment & Plan Note (Signed)
Worsening due to grief (death of mother and sister within 23months) and son with drug addiction.  Declined referral to psychologist. States she has emotional support from family and church. Agreed to start lexapro 10mg . rx sent F/up in 42month

## 2021-04-05 NOTE — Assessment & Plan Note (Signed)
BP at goal with lisinopril BP Readings from Last 3 Encounters:  04/05/21 130/88  01/28/21 99/60  01/06/21 (!) 162/95   Maintain med dose Repeat BMP

## 2021-04-10 DIAGNOSIS — G4733 Obstructive sleep apnea (adult) (pediatric): Secondary | ICD-10-CM | POA: Diagnosis not present

## 2021-04-13 MED ORDER — ALLOPURINOL 100 MG PO TABS: 100.0000 mg | ORAL_TABLET | Freq: Every day | ORAL | 3 refills | Status: DC

## 2021-04-13 MED ORDER — ATORVASTATIN CALCIUM 10 MG PO TABS: 10.0000 mg | ORAL_TABLET | Freq: Every day | ORAL | 3 refills | Status: DC

## 2021-04-13 NOTE — Assessment & Plan Note (Signed)
Repeat lipid panel: LDL at goal Continue atorvastatin dose Lipid Panel     Component Value Date/Time   CHOL 148 04/05/2021 0954   TRIG 62.0 04/05/2021 0954   HDL 61.90 04/05/2021 0954   CHOLHDL 2 04/05/2021 0954   VLDL 12.4 04/05/2021 0954   LDLCALC 74 04/05/2021 0954

## 2021-04-13 NOTE — Assessment & Plan Note (Signed)
Repeat vit. D: normal Maintain OTC MVI with vit. D

## 2021-04-18 ENCOUNTER — Encounter: Payer: Medicare Other | Attending: Physical Medicine & Rehabilitation | Admitting: Physical Medicine & Rehabilitation

## 2021-04-18 ENCOUNTER — Other Ambulatory Visit: Payer: Self-pay

## 2021-04-18 ENCOUNTER — Encounter: Payer: Self-pay | Admitting: Physical Medicine & Rehabilitation

## 2021-04-18 VITALS — BP 159/86 | HR 70 | Temp 98.2°F | Ht 59.0 in | Wt 272.0 lb

## 2021-04-18 DIAGNOSIS — M5416 Radiculopathy, lumbar region: Secondary | ICD-10-CM | POA: Insufficient documentation

## 2021-04-18 NOTE — Patient Instructions (Signed)

## 2021-04-18 NOTE — Progress Notes (Signed)
RIGHT L4-5 Lumbar transforaminal epidural steroid injection under fluoroscopic guidance with contrast enhancement  Indication: Lumbosacral radiculitis is not relieved by medication management or other conservative care and interfering with self-care and mobility.   Informed consent was obtained after describing risk and benefits of the procedure with the patient, this includes bleeding, bruising, infection, paralysis and medication side effects.  The patient wishes to proceed and has given written consent.  Patient was placed in prone position.  The lumbar area was marked and prepped with Betadine.  It was entered with a 25-gauge 1-1/2 inch needle and one mL of 1% lidocaine was injected into the skin and subcutaneous tissue.  Then a 22-gauge 5in spinal needle was inserted into the  Right L4-5  intervertebral foramen under AP, lateral, and oblique view.  Once needle tip was within the foramen on lateral views an dnor exceeding 6 o clock position on th epedical on AP viewed Isovue 200 was inected x 88ml Then a solution containing one mL of 10 mg per mL dexamethasone and 2 mL of 1% lidocaine was injected.  The patient tolerated procedure well.  Post procedure instructions were given.  Please see post procedure form.

## 2021-04-18 NOTE — Progress Notes (Signed)
°  PROCEDURE RECORD [facility]   Name: Sandra Brennan DOB:08-16-1945 MRN: [id number]  Date:04/18/2021  Physician: Alysia Penna, MD    Nurse/CMA: Ronnald Ramp, RMA  Allergies:  Allergies  Allergen Reactions   Cymbalta [Duloxetine Hcl] Anaphylaxis   Pineapple Shortness Of Breath   Sulfa Antibiotics Hives, Shortness Of Breath and Itching    REACTION: unknown   Influenza Vaccines Hives and Itching   Penicillins Hives   Theophyllines Hives    Consent Signed: Yes.    Is patient diabetic? Yes.    CBG today? Borderline - not checking blood glucose  Pregnant: No. LMP: No LMP recorded. Patient has had a hysterectomy. (age 36-55)  Anticoagulants: no Anti-inflammatory: no Antibiotics: no  Procedure: Right L4-5 Transforaminal Epidural Steroid Injection  Position: Prone Start Time:  11:49 am End Time: 11:57 am  Fluoro Time: 74  RN/CMA Munachimso Palin, RMA Emerald Gehres, RMA    Time 11:27 AM 12:05 pm    BP 154/82 159/86    Pulse 79 72    Respirations 16 18    O2 Sat 96 96    S/S 6 6    Pain Level 7/10 0/10     D/C home with Family member, patient A & O X 3, D/C instructions reviewed, and sits independently.

## 2021-04-27 ENCOUNTER — Other Ambulatory Visit: Payer: Self-pay | Admitting: Nurse Practitioner

## 2021-04-27 DIAGNOSIS — F339 Major depressive disorder, recurrent, unspecified: Secondary | ICD-10-CM

## 2021-04-30 ENCOUNTER — Telehealth: Payer: Self-pay

## 2021-04-30 NOTE — Progress Notes (Signed)
Per Clinical pharmacist, please reschedule patient telephone appointment on 05/02/2021.  Telephone follow up reschedule with Care management team member  : 08/01/2021 at 3:45 pm.  Anderson [place] Clinical Pharmacist Assistant [phone]

## 2021-05-02 ENCOUNTER — Telehealth: Payer: Medicare Other

## 2021-05-19 ENCOUNTER — Emergency Department (HOSPITAL_COMMUNITY)
Admission: EM | Admit: 2021-05-19 | Discharge: 2021-05-19 | Disposition: A | Discharge status: Home / Self Care | Payer: Medicare Other | Attending: Student | Admitting: Student

## 2021-05-19 ENCOUNTER — Encounter (HOSPITAL_COMMUNITY): Payer: Self-pay | Admitting: Emergency Medicine

## 2021-05-19 ENCOUNTER — Emergency Department (HOSPITAL_COMMUNITY): Payer: Medicare Other

## 2021-05-19 ENCOUNTER — Other Ambulatory Visit: Payer: Self-pay

## 2021-05-19 DIAGNOSIS — M25552 Pain in left hip: Secondary | ICD-10-CM | POA: Diagnosis not present

## 2021-05-19 DIAGNOSIS — M1612 Unilateral primary osteoarthritis, left hip: Secondary | ICD-10-CM | POA: Diagnosis not present

## 2021-05-19 DIAGNOSIS — Z96642 Presence of left artificial hip joint: Secondary | ICD-10-CM | POA: Insufficient documentation

## 2021-05-19 DIAGNOSIS — M47816 Spondylosis without myelopathy or radiculopathy, lumbar region: Secondary | ICD-10-CM | POA: Diagnosis not present

## 2021-05-19 DIAGNOSIS — E119 Type 2 diabetes mellitus without complications: Secondary | ICD-10-CM | POA: Insufficient documentation

## 2021-05-19 DIAGNOSIS — I10 Essential (primary) hypertension: Secondary | ICD-10-CM | POA: Insufficient documentation

## 2021-05-19 DIAGNOSIS — Z7982 Long term (current) use of aspirin: Secondary | ICD-10-CM | POA: Insufficient documentation

## 2021-05-19 DIAGNOSIS — M549 Dorsalgia, unspecified: Secondary | ICD-10-CM | POA: Diagnosis not present

## 2021-05-19 DIAGNOSIS — Z79899 Other long term (current) drug therapy: Secondary | ICD-10-CM | POA: Diagnosis not present

## 2021-05-19 MED ORDER — LISINOPRIL 20 MG PO TABS
20.0000 mg | ORAL_TABLET | Freq: Once | ORAL | Status: AC
  Administered 2021-05-19: 20 mg via ORAL
  Filled 2021-05-19: qty 1

## 2021-05-19 MED ORDER — PREGABALIN 100 MG PO CAPS
150.0000 mg | ORAL_CAPSULE | Freq: Once | ORAL | Status: AC
  Administered 2021-05-19: 150 mg via ORAL
  Filled 2021-05-19: qty 2

## 2021-05-19 MED ORDER — TRAMADOL HCL 50 MG PO TABS
50.0000 mg | ORAL_TABLET | Freq: Once | ORAL | Status: AC
Start: 2021-05-19 — End: 2021-05-19
  Administered 2021-05-19: 50 mg via ORAL
  Filled 2021-05-19: qty 1

## 2021-05-19 NOTE — ED Provider Triage Note (Signed)
Emergency Medicine Provider Triage Evaluation Note ? ?[name] Parilla , a 76 y.o. female  was evaluated in triage.  Pt complains of multiple complaints. ?Her primary concern today is pain in her left she had a injection recently for her right-sided hip which helped however did not have them do the left side issues as that has not been helping.  Now she is having pain on her left side.  She reports she has also been out of her tramadol and Lyrica for 2 weeks and her whole body hurts. ? ? ? ?Physical Exam  ?BP (!) 189/101 (BP Location: Left Arm)   Pulse 87   Temp 98.2 ?F (36.8 ?C) (Oral)   Resp 18   SpO2 99%  ?Gen:   Awake, no distress   ?Resp:  Normal effort  ?MSK:   Moves extremities without difficulty  ?Other:  2+ DP/PT pulses bilaterally.  Sensation intact to light touch.  Generalized tenderness to palpation to bilateral arms and legs ? ?Medical Decision Making  ?Medically screening exam initiated at 7:02 PM.  Appropriate orders placed.  Jerrine Urschel Pooler was informed that the remainder of the evaluation will be completed by another provider, this initial triage assessment does not replace that evaluation, and the importance of remaining in the ED until their evaluation is complete. ? ? ?  ?Lorin Glass, [place] ?05/19/21 1903 ? ?

## 2021-05-19 NOTE — ED Provider Notes (Signed)
?[facility] ?Provider Note ? ? ?CSN: [id number] ?Arrival date & time: 05/19/21  1817 ? ?  ? ?History ? ?Chief Complaint  ?Patient presents with  ? Hip Pain  ? ? ?Sandra Brennan is a 76 y.o. female. ? ?The history is provided by the patient and medical records.  ?Hip Pain ? ?76 year old female with history of hypertension, GERD, diabetes, chronic back pain, sleep apnea, osteoporosis, presenting to the ED with left hip pain.  States chronic back pain and hip pain for several years now.  She has had a left total hip replacement.  States she had an injection 1 month ago  (04/18/21) and since then has had worsening pain in the left hip.  Also reports she has not had her tramadol or her Lyrica for about 10 days now.  States she uses a Psychologist, clinical and she was sent the wrong prescriptions which she already has.  States she is also not had her lisinopril in about 10 days.  States she was told medication was post to be at the post office yesterday around 9 PM, clearly has not been delivered to her yet.  She denies any new change in her pain.  No radiation down the legs.  No focal numbness or weakness.  She has not had any new falls or trauma.  She does use a walker when walking.   ? ?Home Medications ?Prior to Admission medications   ?Medication Sig Start Date End Date Taking? Authorizing Provider  ?albuterol (PROVENTIL HFA) 108 (90 Base) MCG/ACT inhaler Inhale 1 puff into the lungs every 6 (six) hours as needed for shortness of breath or wheezing. 11/01/20   Nche, Charlene Brooke, NP  ?allopurinol (ZYLOPRIM) 100 MG tablet Take 1 tablet (100 mg total) by mouth daily. 04/13/21   Nche, Charlene Brooke, NP  ?aspirin EC 81 MG tablet Take 81 mg by mouth daily.    [provider]  ?atorvastatin (LIPITOR) 10 MG tablet Take 1 tablet (10 mg total) by mouth daily at 6 PM. 04/13/21   Nche, Charlene Brooke, NP  ?Carboxymethylcellulose Sodium (EYE DROPS OP) Apply to eye. Lubricant eye drops     [provider]  ?cetirizine (ZYRTEC) 10 MG tablet Take 10 mg by mouth daily as needed.    [provider]  ?cholecalciferol (VITAMIN D3) 25 MCG (1000 UNIT) tablet Take 1,000 Units by mouth daily.    [provider]  ?dextromethorphan-guaiFENesin (MUCINEX DM) 30-600 MG 12hr tablet Take 1 tablet by mouth 2 (two) times daily.    [provider]  ?escitalopram (LEXAPRO) 10 MG tablet TAKE 1 TABLET BY MOUTH EVERY DAY 04/29/21   Nche, Charlene Brooke, NP  ?lisinopril (ZESTRIL) 20 MG tablet Take 1 tablet (20 mg total) by mouth daily. 04/05/21   Nche, Charlene Brooke, NP  ?Multiple Vitamin (MULTIVITAMIN) tablet Take 1 tablet by mouth daily.    [provider]  ?omeprazole (PRILOSEC) 20 MG capsule TAKE 1 CAPSULE BY MOUTH EVERY DAY 10/30/20   Nche, Charlene Brooke, NP  ?pregabalin (LYRICA) 150 MG capsule Take 1 capsule (150 mg total) by mouth 2 (two) times daily. 02/01/21   Kirsteins, Luanna Salk, MD  ?Rimegepant Sulfate (NURTEC) 75 MG TBDP Take 75 mg by mouth as needed (take 1 at onset of headache, max is 75 in 24 hours). 01/28/21   Ward Givens, NP  ?Sennosides 8.6 MG CAPS Take 1 capsule by mouth daily.    [provider]  ?SYMBICORT 160-4.5 MCG/ACT inhaler  Inhale 2 puffs into the lungs in the morning and at bedtime. Rinse mouth after each use 10/30/20   Nche, Charlene Brooke, NP  ?traMADol (ULTRAM) 50 MG tablet Take 1 tablet (50 mg total) by mouth 3 (three) times daily. 02/01/21   Kirsteins, Luanna Salk, MD  ?   ? ?Allergies    ?Cymbalta [duloxetine hcl], Pineapple, Sulfa antibiotics, Influenza vaccines, Penicillins, and Theophyllines   ? ?Review of Systems   ?Review of Systems  ?Musculoskeletal:  Positive for arthralgias.  ?All other systems reviewed and are negative. ? ?Physical Exam ?Updated Vital Signs ?BP (!) 178/98 (BP Location: Right Arm)   Pulse 78   Temp 98.2 ?F (36.8 ?C) (Oral)   Resp 18   SpO2 99%  ? ?Physical Exam ?Vitals and nursing note reviewed.  ?Constitutional:   ?    Appearance: She is well-developed.  ?HENT:  ?   Head: Normocephalic and atraumatic.  ?Eyes:  ?   Conjunctiva/sclera: Conjunctivae normal.  ?   Pupils: Pupils are equal, round, and reactive to light.  ?Cardiovascular:  ?   Rate and Rhythm: Normal rate and regular rhythm.  ?   Heart sounds: Normal heart sounds.  ?Pulmonary:  ?   Effort: Pulmonary effort is normal.  ?   Breath sounds: Normal breath sounds.  ?Abdominal:  ?   General: Bowel sounds are normal.  ?   Palpations: Abdomen is soft.  ?Musculoskeletal:     ?   General: Normal range of motion.  ?   Cervical back: Normal range of motion.  ?   Comments: Left hip is normal in appearance without bruising or acute signs of trauma, there is no deformity, no leg shortening or malrotation, DP pulses intact bilaterally  ?Skin: ?   General: Skin is warm and dry.  ?Neurological:  ?   Mental Status: She is alert and oriented to person, place, and time.  ? ? ?ED Results / Procedures / Treatments   ?Labs ?(all labs ordered are listed, but only abnormal results are displayed) ?Labs Reviewed - No data to display ? ?EKG ?None ? ?Radiology ?DG Hip Unilat With Pelvis 2-3 Views Left ? ?Result Date: 05/19/2021 ?CLINICAL DATA:  pain EXAM: DG HIP (WITH OR WITHOUT PELVIS) 2-3V LEFT COMPARISON:  None. FINDINGS: Limited evaluation due to overlapping osseous structures and overlying soft tissues. Total left hip arthroplasty. Moderate degenerative changes of the right femoroacetabular joint. There is no evidence of hip fracture or dislocation of the left hip. Frontal view of the right hip grossly unremarkable. No acute displaced fracture or diastasis of the bones of the pelvis. No aggressive appearing focal bone abnormality. Lumbar spine degenerative changes. IMPRESSION: Negative for acute traumatic injury in a patient status post total left hip arthroplasty. Electronically Signed   By: Iven Finn M.D.   On: 05/19/2021 19:53   ? ?Procedures ?Procedures  ? ? ?Medications Ordered in  ED ?Medications  ?traMADol (ULTRAM) tablet 50 mg (50 mg Oral Given 05/19/21 2249)  ?pregabalin (LYRICA) capsule 150 mg (150 mg Oral Given 05/19/21 2249)  ?lisinopril (ZESTRIL) tablet 20 mg (20 mg Oral Given 05/19/21 2248)  ? ? ?ED Course/ Medical Decision Making/ A&P ?  ?                        ?Medical Decision Making ?Amount and/or Complexity of Data Reviewed ?Radiology: ordered. ? ?Risk ?Prescription drug management. ? ? ?76 year old female here with left hip pain.  This is a chronic  issue for her.  No new injury, trauma, or falls.  States she has not had her chronic medications in about 10 days due to error with her mail order pharmacy.  She is afebrile and nontoxic in appearance here.  Her left hip is without acute deformity or signs of trauma.  She has no leg shortening or malrotation.  Her leg is neurovascular intact.  Denies any new numbness or weakness of the legs.  No bowel or bladder incontinence.  Clinically no signs or symptoms concerning for cauda equina or other emergent spinal process. ? ?Suspect large portion of her symptoms are due to not having her chronic pain medication in about 10 days.  I did have open discussion with her that it appears per controlled substance database these prescriptions were both filled on 05/14/2021 for 30-day supply.  As she is currently in pain management, this will violate her contract to have these refilled here, however happy to give her a dose while in the ED.  She had nausea understanding and is agreeable.  Will d/c home. Advised to follow-up with PCP or prescribing physician if still not receiving her pain medications in the next few days.  Can return here for new concerns. ? ?Final Clinical Impression(s) / ED Diagnoses ?Final diagnoses:  ?Left hip pain  ? ? ?Rx / DC Orders ?ED Discharge Orders   ? ? None  ? ?  ? ? ?  ?Larene Pickett, PA-C ?05/20/21 0107 ? ?  ?Teressa Lower, MD ?05/20/21 2334 ? ?

## 2021-05-19 NOTE — ED Triage Notes (Signed)
Patient here with complains of left hip pain, history of chronic lower back pain and left hip replacement approximately four years ago. Pain started approximately one week ago, patient denies recent injury. Patient is alert, oriented,and in no apparent distress at this time. ?

## 2021-05-19 NOTE — Discharge Instructions (Signed)
Talk with your primary care doctor if your mediation still does not arrive in the morning. ?Return here for new concerns. ?

## 2021-05-28 ENCOUNTER — Encounter: Payer: Medicare Other | Attending: Physical Medicine & Rehabilitation | Admitting: Physical Medicine & Rehabilitation

## 2021-05-28 DIAGNOSIS — Z5181 Encounter for therapeutic drug level monitoring: Secondary | ICD-10-CM | POA: Insufficient documentation

## 2021-05-28 DIAGNOSIS — M48061 Spinal stenosis, lumbar region without neurogenic claudication: Secondary | ICD-10-CM | POA: Insufficient documentation

## 2021-05-28 DIAGNOSIS — G894 Chronic pain syndrome: Secondary | ICD-10-CM | POA: Insufficient documentation

## 2021-05-28 DIAGNOSIS — M797 Fibromyalgia: Secondary | ICD-10-CM | POA: Insufficient documentation

## 2021-05-28 DIAGNOSIS — M5416 Radiculopathy, lumbar region: Secondary | ICD-10-CM | POA: Insufficient documentation

## 2021-05-28 DIAGNOSIS — Z79891 Long term (current) use of opiate analgesic: Secondary | ICD-10-CM | POA: Insufficient documentation

## 2021-05-28 NOTE — Progress Notes (Deleted)
? ?Subjective:  ? ? Patient ID: Sandra Brennan, female    DOB: [birth year], 76 y.o.   MRN: [id number] ? ?HPI ?Pain Inventory ?Average Pain {NUMBERS; 0-10:5044} ?Pain Right Now {NUMBERS; 0-10:5044} ?My pain is {PAIN DESCRIPTION:21022940} ? ?In the last 24 hours, has pain interfered with the following? ?General activity {NUMBERS; 0-10:5044} ?Relation with others {NUMBERS; 0-10:5044} ?Enjoyment of life {NUMBERS; 0-10:5044} ?What TIME of day is your pain at its worst? {time of day:24191} ?Sleep (in general) {BHH GOOD/FAIR/POOR:22877} ? ?Pain is worse with: {ACTIVITIES:21022942} ?Pain improves with: {PAIN IMPROVES HYIF:02774128} ?Relief from Meds: {NUMBERS; 0-10:5044} ? ?Family History  ?Problem Relation Age of Onset  ? Arthritis Mother   ? Arthritis Father   ? Colon cancer Father   ? Diabetes Sister   ? Arthritis Sister   ? Diabetes Maternal Grandmother   ? Arthritis Maternal Grandmother   ? Diabetes Maternal Grandfather   ? Arthritis Maternal Grandfather   ? Migraines Neg Hx   ? Sleep apnea Neg Hx   ? ?Social History  ? ?Socioeconomic History  ? Marital status: Widowed  ?  Spouse name: Not on file  ? Number of children: 5  ? Years of education: PhD  ? Highest education level: Not on file  ?Occupational History  ? Occupation: Retired  ?Tobacco Use  ? Smoking status: Former  ?  Packs/day: 0.12  ?  Years: 2.00  ?  Pack years: 0.24  ?  Types: Cigarettes  ? Smokeless tobacco: Never  ? Tobacco comments:  ?  08/03/2012 "quit 30 years ago"  ?Vaping Use  ? Vaping Use: Never used  ?Substance and Sexual Activity  ? Alcohol use: No  ?  Alcohol/week: 0.0 standard drinks  ?  Comment: 08/03/2012 "used to be a beeralcoholic"; stopped ~ 30 yr ago"  ? Drug use: No  ? Sexual activity: Never  ?Other Topics Concern  ? Not on file  ?Social History Narrative  ? Lives at home with her mother and caregiver.  ? Occasional use of caffeine.  ? Right-handed.  ? ?Social Determinants of Health  ? ?Financial Resource Strain: Low Risk   ? Difficulty of  Paying Living Expenses: Not hard at all  ?Food Insecurity: No Food Insecurity  ? Worried About Charity fundraiser in the Last Year: Never true  ? Ran Out of Food in the Last Year: Never true  ?Transportation Needs: No Transportation Needs  ? Lack of Transportation (Medical): No  ? Lack of Transportation (Non-Medical): No  ?Physical Activity: Inactive  ? Days of Exercise per Week: 0 days  ? Minutes of Exercise per Session: 0 min  ?Stress: No Stress Concern Present  ? Feeling of Stress : Not at all  ?Social Connections: Moderately Integrated  ? Frequency of Communication with Friends and Family: Three times a week  ? Frequency of Social Gatherings with Friends and Family: Once a week  ? Attends Religious Services: More than 4 times per year  ? Active Member of Clubs or Organizations: Yes  ? Attends Archivist Meetings: More than 4 times per year  ? Marital Status: Divorced  ? ?Past Surgical History:  ?Procedure Laterality Date  ? ABDOMINAL HYSTERECTOMY  1990's  ? GANGLION CYST EXCISION Right 1980's  ? "wrist" (08/03/2012)  ? JOINT REPLACEMENT    ? KNEE LIGAMENT RECONSTRUCTION Right 1980's  ? TONSILLECTOMY  ~ 1954  ? TOTAL HIP ARTHROPLASTY Left 08/03/2012  ? TOTAL HIP ARTHROPLASTY Left 08/03/2012  ? Procedure: TOTAL HIP ARTHROPLASTY;  Surgeon: Garald Balding, MD;  Location: [facility];  Service: Orthopedics;  Laterality: Left;  Left Total Hip Arthroplasty  ? TOTAL HIP ARTHROPLASTY Left 08/28/2012  ? Procedure: Irrigation and Debridement hip ;  Surgeon: Mcarthur Rossetti, MD;  Location: [facility];  Service: Orthopedics;  Laterality: Left;  ? TOTAL KNEE ARTHROPLASTY Left 2001  ? TOTAL KNEE ARTHROPLASTY Right 2004  ? ?Past Surgical History:  ?Procedure Laterality Date  ? ABDOMINAL HYSTERECTOMY  1990's  ? GANGLION CYST EXCISION Right 1980's  ? "wrist" (08/19/2012)  ? JOINT REPLACEMENT    ? KNEE LIGAMENT RECONSTRUCTION Right 1980's  ? TONSILLECTOMY  ~ 1954  ? TOTAL HIP ARTHROPLASTY Left 08/19/2012  ? TOTAL HIP ARTHROPLASTY  Left 08/19/12  ? Procedure: TOTAL HIP ARTHROPLASTY;  Surgeon: Garald Balding, MD;  Location: [facility];  Service: Orthopedics;  Laterality: Left;  Left Total Hip Arthroplasty  ? TOTAL HIP ARTHROPLASTY Left 08/28/2012  ? Procedure: Irrigation and Debridement hip ;  Surgeon: Mcarthur Rossetti, MD;  Location: [facility];  Service: Orthopedics;  Laterality: Left;  ? TOTAL KNEE ARTHROPLASTY Left 2001  ? TOTAL KNEE ARTHROPLASTY Right 2004  ? ?Past Medical History:  ?Diagnosis Date  ? Acute posthemorrhagic anemia 09/06/2012  ? Anemia   ? Arthritis   ? "plenty" (August 19, 2012)  ? Asthma   ? Chest pain 03/30/2016  ? Chronic lower back pain   ? "they say I need a whole new spinal column" (2012/08/19)  ? COPD (chronic obstructive pulmonary disease) ([place])   ? Degenerative arthritis   ? "all over" (2012/08/19)  ? Fx ankle   ? GERD (gastroesophageal reflux disease)   ? Gout 05/05/2013  ? Hypertension   ? Migraines   ? "used to; totally stopped when I quit drinking 30 yr ago" (2012-08-19)  ? Myalgia and myositis   ? Myocardial infarction [facility]) 1992  ? 19-Aug-2012 "mild MI when son died "  ? Obesity   ? Osteoporosis   ? "all over" (08/19/2012)  ? Shortness of breath   ? when stressed.  ? Sleep apnea   ? "never did fix my machine; haven't used one for 5 years; I've lost 116# since then; no problems now" (19-Aug-2012)  ? Type II diabetes mellitus ([place])   ? "borderline; don't test; take Metformin" (08/19/12)  ? ?There were no vitals taken for this visit. ? ?Opioid Risk Score:   ?Fall Risk Score:  `1 ? ?Depression screen PHQ 2/9 ? ? ?  04/18/2021  ? 11:25 AM 02/07/2021  ?  3:09 PM 11/13/2020  ? 11:59 AM 10/03/2020  ?  9:34 AM 08/07/2020  ? 10:34 AM 08/02/2020  ? 10:25 AM 08/02/2020  ? 10:12 AM  ?Depression screen PHQ 2/9  ?Decreased Interest '1 2 1 1 '$ 0 1 0  ?Down, Depressed, Hopeless 1 0 0 0 0 1 0  ?PHQ - 2 Score '2 2 1 1 '$ 0 2 0  ?Altered sleeping  '1 2 3     '$ ?Tired, decreased energy  3 1 0     ?Change in appetite  '3 1 3     '$ ?Feeling bad or failure about yourself   0 0 0      ?Trouble concentrating  0 0 0     ?Moving slowly or fidgety/restless  0 2 2     ?Suicidal thoughts  0 0 0     ?PHQ-9 Score  '9 7 9     '$ ?Difficult doing work/chores  Somewhat difficult Somewhat difficult Somewhat  difficult     ?  ? ? ?Review of Systems ? ?   ?Objective:  ? Physical Exam ? ? ? ? ?   ?Assessment & Plan:  ? ? ?

## 2021-05-31 ENCOUNTER — Encounter: Payer: Self-pay | Admitting: Physical Medicine & Rehabilitation

## 2021-05-31 ENCOUNTER — Encounter (HOSPITAL_BASED_OUTPATIENT_CLINIC_OR_DEPARTMENT_OTHER): Payer: Medicare Other | Admitting: Physical Medicine & Rehabilitation

## 2021-05-31 VITALS — BP 129/79 | HR 79 | Ht 59.0 in | Wt 277.0 lb

## 2021-05-31 DIAGNOSIS — M48061 Spinal stenosis, lumbar region without neurogenic claudication: Secondary | ICD-10-CM | POA: Diagnosis not present

## 2021-05-31 DIAGNOSIS — M797 Fibromyalgia: Secondary | ICD-10-CM

## 2021-05-31 DIAGNOSIS — Z5181 Encounter for therapeutic drug level monitoring: Secondary | ICD-10-CM | POA: Diagnosis not present

## 2021-05-31 DIAGNOSIS — G894 Chronic pain syndrome: Secondary | ICD-10-CM | POA: Diagnosis not present

## 2021-05-31 DIAGNOSIS — Z79891 Long term (current) use of opiate analgesic: Secondary | ICD-10-CM | POA: Diagnosis not present

## 2021-05-31 DIAGNOSIS — M5416 Radiculopathy, lumbar region: Secondary | ICD-10-CM

## 2021-05-31 MED ORDER — PREGABALIN 150 MG PO CAPS: 150.0000 mg | ORAL_CAPSULE | Freq: Two times a day (BID) | ORAL | 5 refills | Status: DC

## 2021-05-31 MED ORDER — TRAMADOL HCL 50 MG PO TABS: 50.0000 mg | ORAL_TABLET | Freq: Three times a day (TID) | ORAL | 5 refills | Status: DC

## 2021-05-31 NOTE — Progress Notes (Signed)
? ?Subjective:  ? ? Patient ID: Sandra Brennan, female    DOB: 13-Aug-1945, 76 y.o.   MRN: [id number] ? ?HPI ?76 year old female with fibromyalgia morbid obesity, diabetes with peripheral neuropathy who returns today with complaints of left-sided low back pain radiating to the left lower extremity. ?RIght L4-5 LESI transforaminal , Right sided pain shifted to Left side  ?Did not receive meds in time  from mail order from pharmacy.  She ended up going to the emergency room approximately 1 week ago due to worsening pain.  The impression was that her pain worsened because of being out of her medications.  The patient takes Lyrica 150 mg twice daily as well as tramadol 50 mg 3 times daily.  She wishes to change her pharmacy to the [facility] on [address] ?Pain Inventory ?Average Pain 9 ?Pain Right Now 10 ?My pain is stabbing and aching ? ?In the last 24 hours, has pain interfered with the following? ?General activity 7 ?Relation with others 0 ?Enjoyment of life 8 ?What TIME of day is your pain at its worst? varies ?Sleep (in general) Poor ? ?Pain is worse with: walking, bending, sitting, inactivity, and standing ?Pain improves with: medication ?Relief from Meds: 6 ? ?Family History  ?Problem Relation Age of Onset  ? Arthritis Mother   ? Arthritis Father   ? Colon cancer Father   ? Diabetes Sister   ? Arthritis Sister   ? Diabetes Maternal Grandmother   ? Arthritis Maternal Grandmother   ? Diabetes Maternal Grandfather   ? Arthritis Maternal Grandfather   ? Migraines Neg Hx   ? Sleep apnea Neg Hx   ? ?Social History  ? ?Socioeconomic History  ? Marital status: Widowed  ?  Spouse name: Not on file  ? Number of children: 5  ? Years of education: PhD  ? Highest education level: Not on file  ?Occupational History  ? Occupation: Retired  ?Tobacco Use  ? Smoking status: Former  ?  Packs/day: 0.12  ?  Years: 2.00  ?  Pack years: 0.24  ?  Types: Cigarettes  ? Smokeless tobacco: Never  ? Tobacco comments:  ?  08/03/2012 "quit 30  years ago"  ?Vaping Use  ? Vaping Use: Never used  ?Substance and Sexual Activity  ? Alcohol use: No  ?  Alcohol/week: 0.0 standard drinks  ?  Comment: 08/03/2012 "used to be a beeralcoholic"; stopped ~ 30 yr ago"  ? Drug use: No  ? Sexual activity: Never  ?Other Topics Concern  ? Not on file  ?Social History Narrative  ? Lives at home with her mother and caregiver.  ? Occasional use of caffeine.  ? Right-handed.  ? ?Social Determinants of Health  ? ?Financial Resource Strain: Low Risk   ? Difficulty of Paying Living Expenses: Not hard at all  ?Food Insecurity: No Food Insecurity  ? Worried About Charity fundraiser in the Last Year: Never true  ? Ran Out of Food in the Last Year: Never true  ?Transportation Needs: No Transportation Needs  ? Lack of Transportation (Medical): No  ? Lack of Transportation (Non-Medical): No  ?Physical Activity: Inactive  ? Days of Exercise per Week: 0 days  ? Minutes of Exercise per Session: 0 min  ?Stress: No Stress Concern Present  ? Feeling of Stress : Not at all  ?Social Connections: Moderately Integrated  ? Frequency of Communication with Friends and Family: Three times a week  ? Frequency of Social Gatherings with  Friends and Family: Once a week  ? Attends Religious Services: More than 4 times per year  ? Active Member of Clubs or Organizations: Yes  ? Attends Archivist Meetings: More than 4 times per year  ? Marital Status: Divorced  ? ?Past Surgical History:  ?Procedure Laterality Date  ? ABDOMINAL HYSTERECTOMY  1990's  ? GANGLION CYST EXCISION Right 1980's  ? "wrist" (August 08, 2012)  ? JOINT REPLACEMENT    ? KNEE LIGAMENT RECONSTRUCTION Right 1980's  ? TONSILLECTOMY  ~ 1954  ? TOTAL HIP ARTHROPLASTY Left 2012-08-08  ? TOTAL HIP ARTHROPLASTY Left Aug 08, 2012  ? Procedure: TOTAL HIP ARTHROPLASTY;  Surgeon: Garald Balding, MD;  Location: [place];  Service: Orthopedics;  Laterality: Left;  Left Total Hip Arthroplasty  ? TOTAL HIP ARTHROPLASTY Left 08/28/2012  ? Procedure:  Irrigation and Debridement hip ;  Surgeon: Mcarthur Rossetti, MD;  Location: [place];  Service: Orthopedics;  Laterality: Left;  ? TOTAL KNEE ARTHROPLASTY Left 2001  ? TOTAL KNEE ARTHROPLASTY Right 2004  ? ?Past Surgical History:  ?Procedure Laterality Date  ? ABDOMINAL HYSTERECTOMY  1990's  ? GANGLION CYST EXCISION Right 1980's  ? "wrist" (08-08-2012)  ? JOINT REPLACEMENT    ? KNEE LIGAMENT RECONSTRUCTION Right 1980's  ? TONSILLECTOMY  ~ 1954  ? TOTAL HIP ARTHROPLASTY Left 2012-08-08  ? TOTAL HIP ARTHROPLASTY Left 08-08-12  ? Procedure: TOTAL HIP ARTHROPLASTY;  Surgeon: Garald Balding, MD;  Location: [place];  Service: Orthopedics;  Laterality: Left;  Left Total Hip Arthroplasty  ? TOTAL HIP ARTHROPLASTY Left 08/28/2012  ? Procedure: Irrigation and Debridement hip ;  Surgeon: Mcarthur Rossetti, MD;  Location: [place];  Service: Orthopedics;  Laterality: Left;  ? TOTAL KNEE ARTHROPLASTY Left 2001  ? TOTAL KNEE ARTHROPLASTY Right 2004  ? ?Past Medical History:  ?Diagnosis Date  ? Acute posthemorrhagic anemia 09/06/2012  ? Anemia   ? Arthritis   ? "plenty" (08-Aug-2012)  ? Asthma   ? Chest pain 03/30/2016  ? Chronic lower back pain   ? "they say I need a whole new spinal column" (08/08/2012)  ? COPD (chronic obstructive pulmonary disease) ([place])   ? Degenerative arthritis   ? "all over" (2012-08-08)  ? Fx ankle   ? GERD (gastroesophageal reflux disease)   ? Gout 05/05/2013  ? Hypertension   ? Migraines   ? "used to; totally stopped when I quit drinking 30 yr ago" (2012/08/08)  ? Myalgia and myositis   ? Myocardial infarction [facility]) 1992  ? August 08, 2012 "mild MI when son died "  ? Obesity   ? Osteoporosis   ? "all over" (08/08/12)  ? Shortness of breath   ? when stressed.  ? Sleep apnea   ? "never did fix my machine; haven't used one for 5 years; I've lost 116# since then; no problems now" (2012/08/08)  ? Type II diabetes mellitus ([place])   ? "borderline; don't test; take Metformin" (08-08-2012)  ? ?BP 129/79   Pulse 79   Ht '4\' 11"'$  (1.499  m)   Wt 277 lb (125.6 kg)   SpO2 98%   BMI 55.95 kg/m?  ? ?Opioid Risk Score:   ?Fall Risk Score:  `1 ? ?Depression screen PHQ 2/9 ? ? ?  04/18/2021  ? 11:25 AM 02/07/2021  ?  3:09 PM 11/13/2020  ? 11:59 AM 10/03/2020  ?  9:34 AM 08/07/2020  ? 10:34 AM 08/02/2020  ? 10:25 AM 08/02/2020  ? 10:12 AM  ?Depression screen PHQ 2/9  ?  Decreased Interest '1 2 1 1 '$ 0 1 0  ?Down, Depressed, Hopeless 1 0 0 0 0 1 0  ?PHQ - 2 Score '2 2 1 1 '$ 0 2 0  ?Altered sleeping  '1 2 3     '$ ?Tired, decreased energy  3 1 0     ?Change in appetite  '3 1 3     '$ ?Feeling bad or failure about yourself   0 0 0     ?Trouble concentrating  0 0 0     ?Moving slowly or fidgety/restless  0 2 2     ?Suicidal thoughts  0 0 0     ?PHQ-9 Score  '9 7 9     '$ ?Difficult doing work/chores  Somewhat difficult Somewhat difficult Somewhat difficult     ?  ? ? ?Review of Systems  ?Musculoskeletal:  Positive for back pain and neck pain.  ?     Bilateral shoulder pain ?Right arm pain ?Pelvic pain ?Bilateral foot pain ?Left whole leg pain ?Right upper leg pain  ?All other systems reviewed and are negative. ? ?   ?Objective:  ? Physical Exam ?Vitals reviewed.  ?Constitutional:   ?   Appearance: She is obese.  ?HENT:  ?   Head: Normocephalic and atraumatic.  ?Musculoskeletal:  ?   Comments: Patient with diffuse tenderness in the upper back neck periscapular thoracic and lumbar area.  There is also tenderness over bilateral hip area. ?Lumbar range of motion reduced to approximately 50% forward flexion extension lateral bending and rotation  ?Skin: ?   General: Skin is warm and dry.  ?Neurological:  ?   General: No focal deficit present.  ?   Mental Status: She is alert and oriented to person, place, and time.  ? ?Negative straight leg raise test bilaterally ?Reduced sensation in both feet more so on the left side and including the left ankle on the left side. ?Strength is 5/5 bilateral hip flexor knee extensor ankle dorsiflexor ?Ambulates without assistive device she has a slow  wide-based gait.  This is mainly due to body habitus and pain.  No evidence of ankle ? ?Tenderness palpation left greater trochanter. ? ?   ?Assessment & Plan:  ?Dorsiflexor weakness with ambulation.  1.  Widespread body pain c

## 2021-05-31 NOTE — Patient Instructions (Signed)

## 2021-06-03 ENCOUNTER — Ambulatory Visit (INDEPENDENT_AMBULATORY_CARE_PROVIDER_SITE_OTHER): Payer: Medicare Other | Admitting: Nurse Practitioner

## 2021-06-03 ENCOUNTER — Encounter: Payer: Self-pay | Admitting: Nurse Practitioner

## 2021-06-03 VITALS — BP 140/80 | HR 80 | Temp 97.0°F | Ht 59.0 in | Wt 278.8 lb

## 2021-06-03 DIAGNOSIS — E782 Mixed hyperlipidemia: Secondary | ICD-10-CM | POA: Diagnosis not present

## 2021-06-03 DIAGNOSIS — F339 Major depressive disorder, recurrent, unspecified: Secondary | ICD-10-CM

## 2021-06-03 DIAGNOSIS — E1142 Type 2 diabetes mellitus with diabetic polyneuropathy: Secondary | ICD-10-CM | POA: Diagnosis not present

## 2021-06-03 DIAGNOSIS — M1A00X0 Idiopathic chronic gout, unspecified site, without tophus (tophi): Secondary | ICD-10-CM | POA: Diagnosis not present

## 2021-06-03 DIAGNOSIS — K2100 Gastro-esophageal reflux disease with esophagitis, without bleeding: Secondary | ICD-10-CM | POA: Diagnosis not present

## 2021-06-03 DIAGNOSIS — Z6841 Body Mass Index (BMI) 40.0 and over, adult: Secondary | ICD-10-CM

## 2021-06-03 DIAGNOSIS — J4520 Mild intermittent asthma, uncomplicated: Secondary | ICD-10-CM

## 2021-06-03 DIAGNOSIS — I10 Essential (primary) hypertension: Secondary | ICD-10-CM

## 2021-06-03 LAB — MICROALBUMIN / CREATININE URINE RATIO
Creatinine,U: 27.6 mg/dL
Microalb Creat Ratio: 5.4 mg/g (ref 0.0–30.0)
Microalb, Ur: 1.5 mg/dL (ref 0.0–1.9)

## 2021-06-03 MED ORDER — FLUOXETINE HCL 10 MG PO CAPS: 10.0000 mg | ORAL_CAPSULE | Freq: Every day | ORAL | 5 refills | Status: DC

## 2021-06-03 MED ORDER — LISINOPRIL 20 MG PO TABS: 20.0000 mg | ORAL_TABLET | Freq: Every day | ORAL | 3 refills | Status: DC

## 2021-06-03 MED ORDER — OZEMPIC (0.25 OR 0.5 MG/DOSE) 2 MG/1.5ML ~~LOC~~ SOPN: 0.2500 mg | PEN_INJECTOR | SUBCUTANEOUS | 0 refills | Status: DC

## 2021-06-03 MED ORDER — SYMBICORT 160-4.5 MCG/ACT IN AERO: 2.0000 | INHALATION_SPRAY | Freq: Two times a day (BID) | RESPIRATORY_TRACT | 5 refills | Status: DC

## 2021-06-03 MED ORDER — OMEPRAZOLE 20 MG PO CPDR: DELAYED_RELEASE_CAPSULE | ORAL | 1 refills | Status: DC

## 2021-06-03 MED ORDER — ALLOPURINOL 100 MG PO TABS: 100.0000 mg | ORAL_TABLET | Freq: Every day | ORAL | 3 refills | Status: DC

## 2021-06-03 MED ORDER — ATORVASTATIN CALCIUM 10 MG PO TABS: 10.0000 mg | ORAL_TABLET | Freq: Every day | ORAL | 3 refills | Status: DC

## 2021-06-03 MED ORDER — ALBUTEROL SULFATE HFA 108 (90 BASE) MCG/ACT IN AERS: 1.0000 | INHALATION_SPRAY | Freq: Four times a day (QID) | RESPIRATORY_TRACT | 3 refills | Status: DC | PRN

## 2021-06-03 NOTE — Assessment & Plan Note (Signed)
Current interferes with mobiity and make joint pain worse. ?Limited exercise due to chronic pain  And unsteady gait ?No Fhx of thyroid cancer, No constipation.No gallstones. No pancreatitis ?Previous weight loss attempt with diet modification: lost about 50pounds per patient. ?Wt Readings from Last 3 Encounters:  ?06/03/21 278 lb 12.8 oz (126.5 kg)  ?05/31/21 277 lb (125.6 kg)  ?04/18/21 272 lb (123.4 kg)  ? ?We dicussed use of GLP-1 for weight loss and glucose control. We discussed pros and cons of medication. She agreed to start Ozempic ?New rx sent ?Entered referral to nutritionist ?F/up in 66month? ? ?

## 2021-06-03 NOTE — Assessment & Plan Note (Addendum)
Unable to tolerate lexapro '10mg'$ (dizziness) and cymbalta (anaphylaxis) ?D/c lexapro dose 19monthago, dizziness resolved. ? ?Start fluoxetine '10mg'$  daily ?F/up in 171month ? ?

## 2021-06-03 NOTE — Progress Notes (Signed)
? ?             Established Patient Visit ? ?Patient: Sandra Brennan   DOB: [birth year]   76 y.o. Female  MRN: [id number] ?Visit Date: 06/03/2021 ? ?Subjective:  ?  ?Chief Complaint  ?Patient presents with  ? Follow-up  ?  2 month f/u on depression.  ?Pt also mentions she is in chronic pain and has swelling in her legs.   ? ?HPI ?She requested for all medications to be [facility] of [facility] ? ?Depression, recurrent ([place]) ?Unable to tolerate lexapro '10mg'$ (dizziness) and cymbalta (anaphylaxis) ?D/c lexapro dose 35monthago, dizziness resolved. ? ?Start fluoxetine '10mg'$  daily ?F/up in 128month ? ? ?[place]?Current interferes with mobiity and make joint pain worse. ?Limited exercise due to chronic pain  And unsteady gait ?No Fhx of thyroid cancer, No constipation.No gallstones. No pancreatitis ?Previous weight loss attempt with diet modification: lost about 50pounds per patient. ?Wt Readings from Last 3 Encounters:  ?06/03/21 278 lb 12.8 oz (126.5 kg)  ?05/31/21 277 lb (125.6 kg)  ?04/18/21 272 lb (123.4 kg)  ? ?We dicussed use of GLP-1 for weight loss and glucose control. We discussed pros and cons of medication. She agreed to start Ozempic ?New rx sent ?Entered referral to nutritionist ?F/up in 59m86month? ? ?  06/03/2021  ? 10:39 AM 04/18/2021  ? 11:25 AM 02/07/2021  ?  3:09 PM  ?Depression screen PHQ 2/9  ?Decreased Interest 0 1 2  ?Down, Depressed, Hopeless 0 1 0  ?PHQ - 2 Score 0 2 2  ?Altered sleeping 3  1  ?Tired, decreased energy 0  3  ?Change in appetite 1  3  ?Feeling bad or failure about yourself  0  0  ?Trouble concentrating 0  0  ?Moving slowly or fidgety/restless 0  0  ?Suicidal thoughts 0  0  ?PHQ-9 Score 4  9  ?Difficult doing work/chores Not difficult at all  Somewhat difficult  ?  ? ?  06/03/2021  ? 10:40 AM 10/03/2020  ?  9:34 AM 07/12/2018  ?  9:12 AM 04/09/2018  ?  3:50 PM  ?GAD 7 : Generalized Anxiety Score  ?Nervous, Anxious, on Edge 0 0 1 2  ?Control/stop worrying 0 0 3 3  ?Worry  too much - different things 0 0 3 2  ?Trouble relaxing  '3 3 3  '$ ?Restless 3 0 1 0  ?Easily annoyed or irritable 0 '1 1 2  '$ ?Afraid - awful might happen 0 1 3 0  ?Total GAD 7 Score  '5 15 12  '$ ?Anxiety Difficulty Not difficult at all Not difficult at all    ?  ?Reviewed medical, surgical, and social history today ? ?Medications: ?Outpatient Medications Prior to Visit  ?Medication Sig  ? aspirin EC 81 MG tablet Take 81 mg by mouth daily.  ? Carboxymethylcellulose Sodium (EYE DROPS OP) Apply to eye. Lubricant eye drops  ? cetirizine (ZYRTEC) 10 MG tablet Take 10 mg by mouth daily as needed.  ? cholecalciferol (VITAMIN D3) 25 MCG (1000 UNIT) tablet Take 1,000 Units by mouth daily.  ? dextromethorphan-guaiFENesin (MUCINEX DM) 30-600 MG 12hr tablet Take 1 tablet by mouth 2 (two) times daily.  ? Multiple Vitamin (MULTIVITAMIN) tablet Take 1 tablet by mouth daily.  ? pregabalin (LYRICA) 150 MG capsule Take 1 capsule (150 mg total) by mouth 2 (two) times daily.  ? Rimegepant Sulfate (NURTEC) 75 MG TBDP Take 75 mg by mouth  as needed (take 1 at onset of headache, max is 75 in 24 hours).  ? Sennosides 8.6 MG CAPS Take 1 capsule by mouth daily.  ? traMADol (ULTRAM) 50 MG tablet Take 1 tablet (50 mg total) by mouth 3 (three) times daily.  ? [DISCONTINUED] albuterol (PROVENTIL HFA) 108 (90 Base) MCG/ACT inhaler Inhale 1 puff into the lungs every 6 (six) hours as needed for shortness of breath or wheezing.  ? [DISCONTINUED] allopurinol (ZYLOPRIM) 100 MG tablet Take 1 tablet (100 mg total) by mouth daily.  ? [DISCONTINUED] atorvastatin (LIPITOR) 10 MG tablet Take 1 tablet (10 mg total) by mouth daily at 6 PM.  ? [DISCONTINUED] escitalopram (LEXAPRO) 10 MG tablet TAKE 1 TABLET BY MOUTH EVERY DAY  ? [DISCONTINUED] lisinopril (ZESTRIL) 20 MG tablet Take 1 tablet (20 mg total) by mouth daily.  ? [DISCONTINUED] omeprazole (PRILOSEC) 20 MG capsule TAKE 1 CAPSULE BY MOUTH EVERY DAY  ? [DISCONTINUED] SYMBICORT 160-4.5 MCG/ACT inhaler Inhale 2  puffs into the lungs in the morning and at bedtime. Rinse mouth after each use  ? ?No facility-administered medications prior to visit.  ? ?Reviewed past medical and social history.  ? ?ROS per HPI above ? ? ?   ?Objective:  ?BP 140/80 (BP Location: Left Arm, Patient Position: Sitting, Cuff Size: Large)   Pulse 80   Temp (!) 97 ?F (36.1 ?C) (Temporal)   Ht '4\' 11"'$  (1.499 m)   Wt 278 lb 12.8 oz (126.5 kg)   BMI 56.31 kg/m?  ? ?  ? ?Physical Exam  ?No results found for any visits on 06/03/21. ?   ?Assessment & Plan:  ?  ?Problem List Items Addressed This Visit   ? ?  ? Cardiovascular and Mediastinum  ? Hypertension (Chronic)  ? Relevant Medications  ? lisinopril (ZESTRIL) 20 MG tablet  ? atorvastatin (LIPITOR) 10 MG tablet  ?  ? Respiratory  ? Asthma, chronic (Chronic)  ? Relevant Medications  ? SYMBICORT 160-4.5 MCG/ACT inhaler  ? albuterol (PROVENTIL HFA) 108 (90 Base) MCG/ACT inhaler  ?  ? Digestive  ? GERD with esophagitis  ? Relevant Medications  ? omeprazole (PRILOSEC) 20 MG capsule  ?  ? Endocrine  ? Type 2 diabetes mellitus with diabetic polyneuropathy, without long-term current use of insulin (HCC) (Chronic)  ? Relevant Medications  ? FLUoxetine (PROZAC) 10 MG capsule  ? Semaglutide,0.25 or 0.'5MG'$ /DOS, (OZEMPIC, 0.25 OR 0.5 MG/DOSE,) 2 MG/1.5ML SOPN  ? lisinopril (ZESTRIL) 20 MG tablet  ? atorvastatin (LIPITOR) 10 MG tablet  ? Other Relevant Orders  ? Microalbumin / creatinine urine ratio  ? Referral to Nutrition and Diabetes Services  ?  ? Musculoskeletal and Integument  ? Chronic gouty arthritis  ? Relevant Medications  ? allopurinol (ZYLOPRIM) 100 MG tablet  ?  ? Other  ? Depression, recurrent ([place]) - Primary  ?  Unable to tolerate lexapro '10mg'$ (dizziness) and cymbalta (anaphylaxis) ?D/c lexapro dose 20monthago, dizziness resolved. ? ?Start fluoxetine '10mg'$  daily ?F/up in 173month ? ?  ?  ? Relevant Medications  ? FLUoxetine (PROZAC) 10 MG capsule  ? Hyperlipidemia  ? Relevant Medications  ? lisinopril  (ZESTRIL) 20 MG tablet  ? atorvastatin (LIPITOR) 10 MG tablet  ? Morbid obesity [place] ?  Current interferes with mobiity and make joint pain worse. ?Limited exercise due to chronic pain  And unsteady gait ?No Fhx of thyroid cancer, No constipation.No gallstones. No pancreatitis ?Previous weight loss attempt with diet modification: lost about 50pounds per patient. ?Wt  Readings from Last 3 Encounters:  ?06/03/21 278 lb 12.8 oz (126.5 kg)  ?05/31/21 277 lb (125.6 kg)  ?04/18/21 272 lb (123.4 kg)  ?We dicussed use of GLP-1 for weight loss and glucose control. We discussed pros and cons of medication. She agreed to start Ozempic ?New rx sent ?Entered referral to nutritionist ?F/up in 6month? ? ?  ?  ? Relevant Medications  ? Semaglutide,0.25 or 0.'5MG'$ /DOS, (OZEMPIC, 0.25 OR 0.5 MG/DOSE,) 2 MG/1.5ML SOPN  ? ?Return in about 4 weeks (around 07/01/2021) for depression and weight management. ? ?  ? ?CWilfred Lacy NP ? ? ?

## 2021-06-03 NOTE — Patient Instructions (Signed)
Start fluoxetine and ozempic as prescribed ?Bring BP machine to next appt. ?F/up in 74month?

## 2021-06-27 ENCOUNTER — Other Ambulatory Visit: Payer: Self-pay | Admitting: Nurse Practitioner

## 2021-06-27 DIAGNOSIS — F339 Major depressive disorder, recurrent, unspecified: Secondary | ICD-10-CM

## 2021-07-01 ENCOUNTER — Encounter: Payer: Self-pay | Admitting: Nurse Practitioner

## 2021-07-01 ENCOUNTER — Ambulatory Visit (INDEPENDENT_AMBULATORY_CARE_PROVIDER_SITE_OTHER): Payer: Medicare Other | Admitting: Nurse Practitioner

## 2021-07-01 VITALS — BP 138/82 | HR 80 | Temp 97.6°F | Ht 59.0 in | Wt 278.6 lb

## 2021-07-01 DIAGNOSIS — E1142 Type 2 diabetes mellitus with diabetic polyneuropathy: Secondary | ICD-10-CM

## 2021-07-01 DIAGNOSIS — Z6841 Body Mass Index (BMI) 40.0 and over, adult: Secondary | ICD-10-CM

## 2021-07-01 DIAGNOSIS — F339 Major depressive disorder, recurrent, unspecified: Secondary | ICD-10-CM | POA: Diagnosis not present

## 2021-07-01 DIAGNOSIS — E66813 Obesity, class 3: Secondary | ICD-10-CM

## 2021-07-01 MED ORDER — OZEMPIC (0.25 OR 0.5 MG/DOSE) 2 MG/1.5ML ~~LOC~~ SOPN: 0.5000 mg | PEN_INJECTOR | SUBCUTANEOUS | 2 refills | Status: DC

## 2021-07-01 MED ORDER — FLUOXETINE HCL 10 MG PO CAPS: 10.0000 mg | ORAL_CAPSULE | Freq: Every day | ORAL | 1 refills | Status: DC

## 2021-07-01 NOTE — Patient Instructions (Signed)
Increase ozempic dose to 0.'5mg'$  weekly x 4weeks, then '1mg'$  weekly continuously ?Maintain other medication doses ?F/up in 86month ?

## 2021-07-01 NOTE — Assessment & Plan Note (Signed)
Improved and stable mood with fluoxetine ?Declined any adverse side effects. ? ?Maintain med dose ?F/up in 70month ?

## 2021-07-01 NOTE — Progress Notes (Signed)
? ?             Established Patient Visit ? ?Patient: Sandra Brennan   DOB: 06/26/1945   76 y.o. Female  MRN: [id number] ?Visit Date: 07/01/2021 ? ?Subjective:  ?  ?Chief Complaint  ?Patient presents with  ? Follow-up  ?  1 month f/u on anxiety and weight management.  ?Pt states she will get her diabetic eye exam once she signs up for transportation.   ? ?HPI ?Type 2 diabetes mellitus with diabetic polyneuropathy, without long-term current use of insulin ([place]) ?Denies any adverse effects with ozempic 0.'25mg'$  weekly. ?no glucose check at home. ?Abnormal foot exam today. Advised about need for proper foot care and use of proper fitting footwear at all times. She declined need for podiatry referral at this time. ?Advised to schedule appt with ophthalmology. ?She has noticed decreased in appetite. She has decreased number of soda consumption to 1can per day. Advised to switch to diet soda. ?She has eliminated consumption of candy ?Persistent minimal daily activity due to chronic pain. ?Advised to maintain at least 2 balanced meals per day (vegetables and lean protein). ?Wt Readings from Last 3 Encounters:  ?07/01/21 278 lb 9.6 oz (126.4 kg)  ?06/03/21 278 lb 12.8 oz (126.5 kg)  ?05/31/21 277 lb (125.6 kg)  ?  ?Increase ozempic to 0.'5mg'$  weekly x 253month then '1mg'$  weekly continuously ?F/up in 366month? ?[place], [place]?Improved and stable mood with fluoxetine ?Declined any adverse side effects. ? ?Maintain med dose ?F/up in 53m46month ?Wt Readings from Last 3 Encounters:  ?07/01/21 278 lb 9.6 oz (126.4 kg)  ?06/03/21 278 lb 12.8 oz (126.5 kg)  ?05/31/21 277 lb (125.6 kg)  ?  ?BP Readings from Last 3 Encounters:  ?07/01/21 138/82  ?06/03/21 140/80  ?05/31/21 129/79  ?  ? ?  07/01/2021  ?  8:55 AM 06/03/2021  ? 10:39 AM 04/18/2021  ? 11:25 AM  ?Depression screen PHQ 2/9  ?Decreased Interest 0 0 1  ?Down, Depressed, Hopeless 0 0 1  ?PHQ - 2 Score 0 0 2  ?Altered sleeping 3 3   ?Tired, decreased energy 1 0   ?Change in  appetite 3 1   ?Feeling bad or failure about yourself  0 0   ?Trouble concentrating 0 0   ?Moving slowly or fidgety/restless 0 0   ?Suicidal thoughts 0 0   ?PHQ-9 Score 7 4   ?Difficult doing work/chores Not difficult at all Not difficult at all   ?  ? ?  07/01/2021  ?  8:56 AM 06/03/2021  ? 10:40 AM 10/03/2020  ?  9:34 AM 07/12/2018  ?  9:12 AM  ?GAD 7 : Generalized Anxiety Score  ?Nervous, Anxious, on Edge 0 0 0 1  ?Control/stop worrying 0 0 0 3  ?Worry too much - different things 0 0 0 3  ?Trouble relaxing 0  3 3  ?Restless 0 3 0 1  ?Easily annoyed or irritable 0 0 1 1  ?Afraid - awful might happen 0 0 1 3  ?Total GAD 7 Score 0  5 15  ?Anxiety Difficulty  Not difficult at all Not difficult at all   ? ?Reviewed medical, surgical, and social history today ? ?Medications: ?Outpatient Medications Prior to Visit  ?Medication Sig  ? albuterol (PROVENTIL HFA) 108 (90 Base) MCG/ACT inhaler Inhale 1 puff into the lungs every 6 (six) hours as needed for shortness of breath or wheezing.  ? allopurinol (ZYLOPRIM) 100 MG tablet Take  1 tablet (100 mg total) by mouth daily.  ? aspirin EC 81 MG tablet Take 81 mg by mouth daily.  ? atorvastatin (LIPITOR) 10 MG tablet Take 1 tablet (10 mg total) by mouth daily at 6 PM.  ? Carboxymethylcellulose Sodium (EYE DROPS OP) Apply to eye. Lubricant eye drops  ? cetirizine (ZYRTEC) 10 MG tablet Take 10 mg by mouth daily as needed.  ? cholecalciferol (VITAMIN D3) 25 MCG (1000 UNIT) tablet Take 1,000 Units by mouth daily.  ? dextromethorphan-guaiFENesin (MUCINEX DM) 30-600 MG 12hr tablet Take 1 tablet by mouth 2 (two) times daily.  ? lisinopril (ZESTRIL) 20 MG tablet Take 1 tablet (20 mg total) by mouth daily.  ? Multiple Vitamin (MULTIVITAMIN) tablet Take 1 tablet by mouth daily.  ? omeprazole (PRILOSEC) 20 MG capsule TAKE 1 CAPSULE BY MOUTH EVERY DAY  ? pregabalin (LYRICA) 150 MG capsule Take 1 capsule (150 mg total) by mouth 2 (two) times daily.  ? Rimegepant Sulfate (NURTEC) 75 MG TBDP Take 75  mg by mouth as needed (take 1 at onset of headache, max is 75 in 24 hours).  ? Sennosides 8.6 MG CAPS Take 1 capsule by mouth daily.  ? SYMBICORT 160-4.5 MCG/ACT inhaler Inhale 2 puffs into the lungs in the morning and at bedtime. Rinse mouth after each use  ? traMADol (ULTRAM) 50 MG tablet Take 1 tablet (50 mg total) by mouth 3 (three) times daily.  ? [DISCONTINUED] FLUoxetine (PROZAC) 10 MG capsule Take 1 capsule (10 mg total) by mouth daily.  ? [DISCONTINUED] Semaglutide,0.25 or 0.'5MG'$ /DOS, (OZEMPIC, 0.25 OR 0.5 MG/DOSE,) 2 MG/1.5ML SOPN Inject 0.25 mg into the skin once a week.  ? ?No facility-administered medications prior to visit.  ? ?Reviewed past medical and social history.  ? ?ROS per HPI above ? ? ?   ?Objective:  ?BP 138/82 (BP Location: Left Arm, Patient Position: Sitting, Cuff Size: Large)   Pulse 80   Temp 97.6 ?F (36.4 ?C) (Temporal)   Ht '4\' 11"'$  (1.499 m)   Wt 278 lb 9.6 oz (126.4 kg)   SpO2 97%   BMI 56.27 kg/m?  ? ?  ? ?Physical Exam ?Vitals reviewed.  ?Cardiovascular:  ?   Rate and Rhythm: Normal rate and regular rhythm.  ?   Pulses: Normal pulses.  ?   Heart sounds: Normal heart sounds.  ?Pulmonary:  ?   Effort: Pulmonary effort is normal.  ?   Breath sounds: Normal breath sounds.  ?Musculoskeletal:  ?   Right lower leg: Edema present.  ?   Left lower leg: Edema present.  ?Skin: ?   Findings: No erythema.  ?Neurological:  ?   Mental Status: She is alert and oriented to person, place, and time.  ?Psychiatric:     ?   Mood and Affect: Mood normal.     ?   Behavior: Behavior normal.     ?   Thought Content: Thought content normal.  ?  ?No results found for any visits on 07/01/21. ?   ?Assessment & Plan:  ?  ?Problem List Items Addressed This Visit   ? ?  ? Endocrine  ? Type 2 diabetes mellitus with diabetic polyneuropathy, without long-term current use of insulin (HCC) - Primary (Chronic)  ?  Denies any adverse effects with ozempic 0.'25mg'$  weekly. ?no glucose check at home. ?Abnormal foot exam  today. Advised about need for proper foot care and use of proper fitting footwear at all times. She declined need for podiatry referral at  this time. ?Advised to schedule appt with ophthalmology. ?She has noticed decreased in appetite. She has decreased number of soda consumption to 1can per day. Advised to switch to diet soda. ?She has eliminated consumption of candy ?Persistent minimal daily activity due to chronic pain. ?Advised to maintain at least 2 balanced meals per day (vegetables and lean protein). ?Wt Readings from Last 3 Encounters:  ?07/01/21 278 lb 9.6 oz (126.4 kg)  ?06/03/21 278 lb 12.8 oz (126.5 kg)  ?05/31/21 277 lb (125.6 kg)  ?  ?Increase ozempic to 0.'5mg'$  weekly x 271month then '1mg'$  weekly continuously ?F/up in 355month?  ?  ? Relevant Medications  ? Semaglutide,0.25 or 0.'5MG'$ /DOS, (OZEMPIC, 0.25 OR 0.5 MG/DOSE,) 2 MG/1.5ML SOPN  ? FLUoxetine (PROZAC) 10 MG capsule  ?  ? Other  ? Depression, recurrent ([place] ?  Improved and stable mood with fluoxetine ?Declined any adverse side effects. ? ?Maintain med dose ?F/up in 71m60month ?  ?  ? Relevant Medications  ? FLUoxetine (PROZAC) 10 MG capsule  ? Obesity  ? Relevant Medications  ? Semaglutide,0.25 or 0.'5MG'$ /DOS, (OZEMPIC, 0.25 OR 0.5 MG/DOSE,) 2 MG/1.5ML SOPN  ? ?Return in about 3 months (around 10/01/2021) for DM and HTN, hyperlipidemia (fasting). ? ?  ? ?ChaWilfred LacyP ? ? ?

## 2021-07-01 NOTE — Assessment & Plan Note (Addendum)
Denies any adverse effects with ozempic 0.'25mg'$  weekly. ?no glucose check at home. ?Abnormal foot exam today. Advised about need for proper foot care and use of proper fitting footwear at all times. She declined need for podiatry referral at this time. ?Advised to schedule appt with ophthalmology. ?She has noticed decreased in appetite. She has decreased number of soda consumption to 1can per day. Advised to switch to diet soda. ?She has eliminated consumption of candy ?Persistent minimal daily activity due to chronic pain. ?Advised to maintain at least 2 balanced meals per day (vegetables and lean protein). ?Wt Readings from Last 3 Encounters:  ?07/01/21 278 lb 9.6 oz (126.4 kg)  ?06/03/21 278 lb 12.8 oz (126.5 kg)  ?05/31/21 277 lb (125.6 kg)  ?  ?Increase ozempic to 0.'5mg'$  weekly x 29month then '1mg'$  weekly continuously ?F/up in 3106month?

## 2021-07-03 NOTE — Telephone Encounter (Signed)
Duplicate request

## 2021-07-09 ENCOUNTER — Telehealth: Payer: Self-pay | Admitting: *Deleted

## 2021-07-09 DIAGNOSIS — G4733 Obstructive sleep apnea (adult) (pediatric): Secondary | ICD-10-CM | POA: Diagnosis not present

## 2021-07-09 NOTE — Telephone Encounter (Signed)
Received fax for signing for orders of cpap supplies.  Pt last seen 01-28-2021 as listed  2.  OSA on CPAP  ?  ?-- Continue CPAP ?--Good compliance ?--Good treatment of apnea ?--Encouraged patient to continue using nightly and greater than 4 hours each night. ? ?Placed in your inbox.  ?

## 2021-07-14 ENCOUNTER — Other Ambulatory Visit: Payer: Self-pay | Admitting: Nurse Practitioner

## 2021-07-14 DIAGNOSIS — I10 Essential (primary) hypertension: Secondary | ICD-10-CM

## 2021-07-14 DIAGNOSIS — M1A00X0 Idiopathic chronic gout, unspecified site, without tophus (tophi): Secondary | ICD-10-CM

## 2021-07-14 DIAGNOSIS — M1A9XX0 Chronic gout, unspecified, without tophus (tophi): Secondary | ICD-10-CM

## 2021-07-16 NOTE — Telephone Encounter (Signed)
Spoke with patient and she states she no longer uses the mail order pharmacy and does not want medications going through them. Chart has been updated and pharmacy notified.  ?

## 2021-07-18 ENCOUNTER — Encounter: Payer: Self-pay | Admitting: Physical Medicine & Rehabilitation

## 2021-07-18 ENCOUNTER — Encounter: Payer: Medicare Other | Attending: Physical Medicine & Rehabilitation | Admitting: Physical Medicine & Rehabilitation

## 2021-07-18 VITALS — BP 119/80 | HR 77 | Ht 59.0 in | Wt 268.8 lb

## 2021-07-18 DIAGNOSIS — M7062 Trochanteric bursitis, left hip: Secondary | ICD-10-CM | POA: Insufficient documentation

## 2021-07-18 DIAGNOSIS — M7061 Trochanteric bursitis, right hip: Secondary | ICD-10-CM | POA: Insufficient documentation

## 2021-07-18 NOTE — Progress Notes (Signed)
LEFT Trochanteric bursa injection With ultrasound guidance 20611  Indication Trochanteric bursitis. Exam has tenderness over the greater trochanter of the hip. Pain has not responded to conservative care such as exercise therapy and oral medications. Pain interferes with sleep or with mobility Informed consent was obtained after describing risks and benefits of the procedure with the patient these include bleeding bruising and infection. Patient has signed written consent form. Patient placed in a lateral decubitus position with the affected hip superior. Greater trochanter was identified with US imaging using 4 Hz curvilinear transducer at depth of 4 cm. 1% lidocaine was infiltrated in skin and subq tissue. 49m ECHObloc needle was used under direct UKoreavisualization long axis to needle  to bone contact.Images saved  Needle slightly withdrawn then '6mg'$  of betamethasone with 4 cc 1% lidocaine were injected. Patient tolerated procedure well. Post procedure instructions given.

## 2021-07-21 ENCOUNTER — Other Ambulatory Visit: Payer: Self-pay | Admitting: Nurse Practitioner

## 2021-07-31 ENCOUNTER — Telehealth: Payer: Self-pay

## 2021-07-31 NOTE — Progress Notes (Signed)
Chronic Care Management APPOINTMENT REMINDER  Sandra Brennan was reminded to have all medications, supplements and any blood glucose and blood pressure readings available for review with Junius Argyle, Pharm. D, at her telephone visit on 08/01/2021 at 3:45 pm.  Patient Confirm appointment.  [facility] Pharmacist Assistant [phone]

## 2021-08-01 ENCOUNTER — Ambulatory Visit (INDEPENDENT_AMBULATORY_CARE_PROVIDER_SITE_OTHER): Payer: Medicare Other

## 2021-08-01 DIAGNOSIS — E1142 Type 2 diabetes mellitus with diabetic polyneuropathy: Secondary | ICD-10-CM

## 2021-08-01 DIAGNOSIS — F339 Major depressive disorder, recurrent, unspecified: Secondary | ICD-10-CM

## 2021-08-01 NOTE — Progress Notes (Unsigned)
Chronic Care Management Pharmacy Note  08/02/2021 Name:  Sandra Brennan MRN:  [id number] DOB:  [birth year]  Summary: Patient presents for CCM follow-up.   -Reports some appetite reduction since starting Ozempic. Reports 20+ lbs. weight loss. Wants to start water aerobics but lack of transportation is a limiting factor.  -PH9 of 3 today. Patient unsure of how much benefit she is getting from fluoxetine but will continue at least until next PCP follow-up.   Recommendations/Changes made from today's visit: Recommend allergist work-up to determine if symptoms are angioedema vs food allergy.   Plan: CPP follow-up 3 months  Subjective: Sandra Brennan is an 76 y.o. year old female who is a primary patient of Nche, Charlene Brooke, NP.  The CCM team was consulted for assistance with disease management and care coordination needs.    Engaged with patient by telephone for follow up visit in response to provider referral for pharmacy case management and/or care coordination services.   Consent to Services:  The patient was given information about Chronic Care Management services, agreed to services, and gave verbal consent prior to initiation of services.  Please see initial visit note for detailed documentation.   Patient Care Team: Nche, Charlene Brooke, NP as PCP - General (Internal Medicine) Garald Balding, MD as Consulting Physician (Orthopedic Surgery) Germaine Pomfret, [facility] as Pharmacist (Pharmacist)  Recent office visits: 07/01/21: Patient presented to Wilfred Lacy, NP for follow-up. Ozempic 0.5 mg weekly 06/03/21:  Patient presented to Wilfred Lacy, NP for follow-up. Ozempic 0.25 mg weekly 04/05/21:  Patient presented to Wilfred Lacy, NP for follow-up. Escitalopram 10 mg daily.  Recent consult visits: 07/18/21: Patient presented to Dr. Letta Pate (physical medicine) for follow-up.   Hospital visits: 05/19/21: Patient presented to ED for hip pain.    Objective:  Lab Results   Component Value Date   CREATININE 1.12 04/05/2021   BUN 12 04/05/2021   GFR 48.67 (L) 04/05/2021   GFRNONAA 52 (L) 01/06/2021   GFRAA 46 (L) 03/07/2019   NA 141 04/05/2021   K 4.5 04/05/2021   CALCIUM 9.4 04/05/2021   CO2 31 04/05/2021   GLUCOSE 90 04/05/2021    Lab Results  Component Value Date/Time   HGBA1C 5.7 04/05/2021 09:54 AM   HGBA1C 6.4 (A) 10/03/2020 09:31 AM   HGBA1C 6.4 10/03/2020 09:31 AM   HGBA1C 6.2 03/26/2020 02:15 PM   GFR 48.67 (L) 04/05/2021 09:54 AM   GFR 40.49 (L) 09/10/2020 12:03 PM   MICROALBUR 1.5 06/03/2021 10:55 AM   MICROALBUR <0.7 03/26/2020 02:15 PM    Last diabetic Eye exam:  Lab Results  Component Value Date/Time   HMDIABEYEEXA No Retinopathy 05/12/2018 12:00 AM    Last diabetic Foot exam: No results found for: HMDIABFOOTEX   Lab Results  Component Value Date   CHOL 148 04/05/2021   HDL 61.90 04/05/2021   LDLCALC 74 04/05/2021   TRIG 62.0 04/05/2021   CHOLHDL 2 04/05/2021       Latest Ref Rng & Units 01/05/2021    8:17 AM 09/10/2020   12:03 PM 08/21/2020    3:58 AM  Hepatic Function  Total Protein 6.5 - 8.1 g/dL 7.3   6.7     Albumin 3.5 - 5.0 g/dL 3.4   3.7   3.2    AST 15 - 41 U/L 26   19     ALT 0 - 44 U/L 12   12     Alk Phosphatase 38 - 126 U/L 84  83     Total Bilirubin 0.3 - 1.2 mg/dL 1.0   0.4       Lab Results  Component Value Date/Time   TSH 1.59 04/05/2021 01:20 PM   TSH 2.80 02/26/2017 08:38 AM       Latest Ref Rng & Units 01/06/2021    3:11 PM 01/05/2021    8:17 AM 09/10/2020   12:03 PM  CBC  WBC 4.0 - 10.5 K/uL 7.9   7.9   6.6    Hemoglobin 12.0 - 15.0 g/dL 11.2   11.4   11.1    Hematocrit 36.0 - 46.0 % 35.8   35.6   34.2    Platelets 150 - 400 K/uL 218   213   212.0      Lab Results  Component Value Date/Time   VD25OH 83.12 04/05/2021 09:54 AM    Clinical ASCVD: No  The 10-year ASCVD risk score (Arnett DK, et al., 2019) is: 23.2%   Values used to calculate the score:     Age: 10 years     Sex:  Female     Is Non-Hispanic African American: Yes     Diabetic: Yes     Tobacco smoker: No     Systolic Blood Pressure: 784 mmHg     Is BP treated: Yes     HDL Cholesterol: 61.9 mg/dL     Total Cholesterol: 148 mg/dL       08/01/2021    4:27 PM 07/01/2021    8:55 AM 06/03/2021   10:39 AM  Depression screen PHQ 2/9  Decreased Interest 1 0 0  Down, Depressed, Hopeless 0 0 0  PHQ - 2 Score 1 0 0  Altered sleeping '2 3 3  ' Tired, decreased energy 0 1 0  Change in appetite 0 3 1  Feeling bad or failure about yourself  0 0 0  Trouble concentrating 0 0 0  Moving slowly or fidgety/restless 0 0 0  Suicidal thoughts 0 0 0  PHQ-9 Score '3 7 4  ' Difficult doing work/chores Somewhat difficult Not difficult at all Not difficult at all    Social History   Tobacco Use  Smoking Status Former   Packs/day: 0.12   Years: 2.00   Pack years: 0.24   Types: Cigarettes  Smokeless Tobacco Never  Tobacco Comments   08/03/2012 "quit 30 years ago"   BP Readings from Last 3 Encounters:  07/18/21 119/80  07/01/21 138/82  06/03/21 140/80   Pulse Readings from Last 3 Encounters:  07/18/21 77  07/01/21 80  06/03/21 80   Wt Readings from Last 3 Encounters:  07/18/21 268 lb 12.8 oz (121.9 kg)  07/01/21 278 lb 9.6 oz (126.4 kg)  06/03/21 278 lb 12.8 oz (126.5 kg)   BMI Readings from Last 3 Encounters:  07/18/21 54.29 kg/m  07/01/21 56.27 kg/m  06/03/21 56.31 kg/m    Assessment/Interventions: Review of patient past medical history, allergies, medications, health status, including review of consultants reports, laboratory and other test data, was performed as part of comprehensive evaluation and provision of chronic care management services.   SDOH:  (Social Determinants of Health) assessments and interventions performed: Yes   SDOH Screenings   Alcohol Screen: Low Risk    Last Alcohol Screening Score (AUDIT): 0  Depression (PHQ2-9): Low Risk    PHQ-2 Score: 3  Financial Resource Strain: Low  Risk    Difficulty of Paying Living Expenses: Not hard at all  Food Insecurity: No Food Insecurity  Worried About Charity fundraiser in the Last Year: Never true   Ran Out of Food in the Last Year: Never true  Housing: Low Risk    Last Housing Risk Score: 0  Physical Activity: Inactive   Days of Exercise per Week: 0 days   Minutes of Exercise per Session: 0 min  Social Connections: Moderately Integrated   Frequency of Communication with Friends and Family: Three times a week   Frequency of Social Gatherings with Friends and Family: Once a week   Attends Religious Services: More than 4 times per year   Active Member of [employer] or Organizations: Yes   Attends Music therapist: More than 4 times per year   Marital Status: Divorced  Stress: No Stress Concern Present   Feeling of Stress : Not at all  Tobacco Use: Medium Risk   Smoking Tobacco Use: Former   Smokeless Tobacco Use: Never   Passive Exposure: Not on Pensions consultant Needs: No Transportation Needs   Lack of Transportation (Medical): No   Lack of Transportation (Non-Medical): No    CCM Care Plan  Allergies  Allergen Reactions   Cymbalta [Duloxetine Hcl] Anaphylaxis   Pineapple Shortness Of Breath   Sulfa Antibiotics Hives, Shortness Of Breath and Itching    REACTION: unknown   Influenza Vaccines Hives and Itching   Penicillins Hives   Theophyllines Hives    Medications Reviewed Today     Reviewed by Charlett Blake, MD (Physician) on 07/18/21 at 1441  Med List Status: <None>   Medication Order Taking? Sig Documenting Provider Last Dose Status Informant  albuterol (PROVENTIL HFA) 108 (90 Base) MCG/ACT inhaler [id number]  Inhale 1 puff into the lungs every 6 (six) hours as needed for shortness of breath or wheezing. Flossie Buffy, NP  Active   allopurinol (ZYLOPRIM) 100 MG tablet [id number]  Take 1 tablet (100 mg total) by mouth daily. Flossie Buffy, NP  Active   aspirin EC 81 MG  tablet [id number]  Take 81 mg by mouth daily. [provider]  Active Self  atorvastatin (LIPITOR) 10 MG tablet [id number]  Take 1 tablet (10 mg total) by mouth daily at 6 PM. Nche, Charlene Brooke, NP  Active   Carboxymethylcellulose Sodium (EYE DROPS OP) [id number]  Apply to eye. Lubricant eye drops [provider]  Active Self  cetirizine (ZYRTEC) 10 MG tablet [id number]  Take 10 mg by mouth daily as needed. [provider]  Active Self  cholecalciferol (VITAMIN D3) 25 MCG (1000 UNIT) tablet [id number]  Take 1,000 Units by mouth daily. [provider]  Active Self  dextromethorphan-guaiFENesin (MUCINEX DM) 30-600 MG 12hr tablet [id number]  Take 1 tablet by mouth 2 (two) times daily. [provider]  Active   FLUoxetine (PROZAC) 10 MG capsule [id number]  Take 1 capsule (10 mg total) by mouth daily. Nche, Charlene Brooke, NP  Active   lisinopril (ZESTRIL) 20 MG tablet [id number]  Take 1 tablet (20 mg total) by mouth daily. Flossie Buffy, NP  Active   Multiple Vitamin (MULTIVITAMIN) tablet [id number]  Take 1 tablet by mouth daily. [provider]  Active Self  omeprazole (PRILOSEC) 20 MG capsule [id number]  TAKE 1 CAPSULE BY MOUTH EVERY DAY Nche, Charlene Brooke, NP  Active   OZEMPIC, 0.25 OR 0.5 MG/DOSE, 2 MG/3ML SOPN [id number]  Inject 0.5 mg into the skin once a week. [provider]  Active   pregabalin (LYRICA) 150 MG capsule [id number]  Take 1  capsule (150 mg total) by mouth 2 (two) times daily. Kirsteins, Luanna Salk, MD  Active   Rimegepant Sulfate (NURTEC) 75 MG TBDP [id number]  Take 75 mg by mouth as needed (take 1 at onset of headache, max is 75 in 24 hours). Ward Givens, NP  Active   Semaglutide,0.25 or 0.5MG/DOS, (OZEMPIC, 0.25 OR 0.5 MG/DOSE,) 2 MG/1.5ML SOPN [id number]  Inject 0.5 mg into the skin once a week. Flossie Buffy, NP  Active   Sennosides 8.6 MG CAPS [id number]  Take 1 capsule by mouth daily. [provider]  Active  Self           Med Note Germaine Pomfret   Thu Feb 07, 2021  4:04 PM)    SYMBICORT 160-4.5 MCG/ACT inhaler [id number]  Inhale 2 puffs into the lungs in the morning and at bedtime. Rinse mouth after each use Nche, Charlene Brooke, NP  Active   traMADol (ULTRAM) 50 MG tablet [id number]  Take 1 tablet (50 mg total) by mouth 3 (three) times daily. Charlett Blake, MD  Active             Patient Active Problem List   Diagnosis Date Noted   Chronic pain syndrome 05/31/2021   Lumbar radiculitis 04/18/2021   CKD (chronic kidney disease) stage 3, GFR 30-59 ml/min ([place]) 08/20/2020   Morbid obesity ([place]) 08/20/2020   Knee pain 08/20/2020   Intertrigo 03/27/2020   Normocytic anemia 08/23/2019   Polyethylene liner wear following total hip arthroplasty requiring isolated polyethylene liner exchange ([place]) 07/12/2019   Depression, recurrent ([place]) 07/12/2018   Migraine without aura and without status migrainosus, not intractable 09/10/2017   Post-traumatic osteoarthritis of right ankle 06/09/2016   Spondylosis without myelopathy or radiculopathy, lumbar region 06/09/2016   Cervical spinal stenosis 06/09/2016   Spinal stenosis, lumbar region, without neurogenic claudication 06/09/2016   Chronic gouty arthritis 06/04/2016   Vitamin D deficiency 06/02/2016   Hyperlipidemia 06/02/2016   Closed right ankle fracture 05/01/2016   Colon polyps 05/01/2016   Fibromyalgia syndrome 05/01/2016   Syncope 03/30/2016   Fall 03/30/2016   GERD with esophagitis 06/21/2015   Pharyngeal dysphagia 06/21/2015   Obesity 05/06/2013   Acute nontraumatic kidney injury ([place]) 05/05/2013   Osteoarthritis of left hip 08/05/2012   Type 2 diabetes mellitus with diabetic polyneuropathy, without long-term current use of insulin ([place]) 08/05/2012   Hypertension 08/05/2012   Asthma, chronic 08/05/2012   Sleep apnea 08/05/2012    Immunization History  Administered Date(s) Administered   Moderna Sars-Covid-2 Vaccination  05/09/2019, 06/10/2019, 09/05/2020   Pneumococcal Conjugate-13 06/02/2016   Pneumococcal Polysaccharide-23 08/31/2017    Conditions to be addressed/monitored:  Hypertension, Hyperlipidemia, Diabetes, GERD, Asthma, Chronic Kidney Disease, Osteoarthritis, Gout, and Migraines  Care Plan : General Pharmacy (Adult)  Updates made by Germaine Pomfret, RPH since 08/02/2021 12:00 AM     Problem: Hypertension, Hyperlipidemia, Diabetes, GERD, Asthma, Chronic Kidney Disease, Osteoarthritis, Gout, and Migraines   Priority: High     Long-Range Goal: Patient-Specific Goal   Start Date: 02/28/2021  Expected End Date: 02/28/2022  This Visit's Progress: On track  Recent Progress: On track  Priority: High  Note:   Current Barriers:  No barriers noted  Pharmacist Clinical Goal(s):  Patient will maintain control of blood pressure as evidenced by BP less than 140/90  through collaboration with PharmD and provider.   Interventions: 1:1 collaboration with Nche, Charlene Brooke, NP regarding development and update of comprehensive plan of care as evidenced by provider attestation and  co-signature Inter-disciplinary care team collaboration (see longitudinal plan of care) Comprehensive medication review performed; medication list updated in electronic medical record  Hypertension (BP goal <140/90) -Controlled -Current treatment: Lisinopril 20 mg daily -Medications previously tried: NA  -Current home readings: Not monitoring regularly -Denies hypotensive/hypertensive symptoms  -Recommended to continue current medication  Hyperlipidemia: (LDL goal < 100) -Controlled -Current treatment: Atorvastatin 10 mg daily  -Medications previously tried: NA -Recommended to continue current medication  Diabetes (A1c goal <7%) -Controlled -Current medications: Ozempic 0.5 mg weekly -Medications previously tried: Metformin  -Reports some appetite reduction since starting Ozempic. Reports 20+ lbs. weight  loss. Wants to start water aerobics but lack of transportation is a limiting factor. She plans to reach out to her insurance to discuss transportation options.  -Recommended to continue current medication  Depression/Anxiety (Goal: Achieve symptom remission) -Controlled -Current treatment: Fluoxetine 10 mg daily: Appropriate, Effective, Safe, Accessible  -Medications previously tried/failed: Escitalopram -Low motivation in the morning. She is able to get up to get her grandson to school or go to work.  -Sleeps throughout the day for 1-2 hours at a time. Poor sleep at night due to poor pain control. Does not use her CPAP every night, states sometimes it "acts up" and wakes her up. She has a follow-up with neurology soon to evaluate her CPAP settings.  -PHQ9: 3 today  -Recommended to continue current medication  Asthma (Goal: control symptoms and prevent exacerbations) -Controlled -Current treatment  Albuterol 1-2 puffs every 6 hours as needed Symbicort 2 puffs twice daily  -Medications previously tried: NA  -Exacerbations requiring treatment in last 6 months: None -Patient reports consistent use of maintenance inhaler -Frequency of rescue inhaler use: 2-3 times weekly, mostly when more active.  -Recommended to continue current medication  GERD (Goal: Prevent Heartburn/Reflux) -Controlled -Current treatment  Omeprazole 20 mg daily  -Medications previously tried: NA -Counseled on trigger avoidance.   -Recommended to continue current medication  Gout (Goal: Prevent gout flares) -Controlled -Last Gout Flare: 1 Year ago, both feet and legs  -Current treatment  Allopurinol 100 mg daily  -Medications previously tried: NA  -We discussed:  Counseled patient on low purine diet plan. Counseled patient to reduce consumption of high-fructose corn syrup, sweetened soft drinks, fruit juices, meat, and seafood. -Recommended to continue current medication  Chronic Pain (Goal: Minimize pain  symptoms) -Controlled -Current treatment  Pregabalin 150 mg twice daily  Tramadol 50 mg three times daily  -Medications previously tried: NA  -Pain 8 or 9 without medicine. 5 or 6.  -Recommended to continue current medication  Migraines (Goal: Prevent migraines) -Controlled -Last month has had 3 migraines requiring treatment.  -Current treatment  Rimepgepant 75 mg once daily as needed for headaches  -Medications previously tried: NA  -Reports symptoms requiring treatment ~1x monthly.  -Recommended to continue current medication  Chronic Kidney Disease Stage 3b  -All medications assessed for renal dosing and appropriateness in chronic kidney disease. -Recommended to continue current medication  Patient Goals/Self-Care Activities Patient will:  - check blood pressure weekly, document, and provide at future appointments  Follow Up Plan: Telephone follow up appointment with care management team member scheduled for:  11/21/2021 at 3:00 PM       Medication Assistance: None required.  Patient affirms current coverage meets needs.  Compliance/Adherence/Medication fill history: Care Gaps: Shingrix Vaccine Ophthalmology Exam (Last Completed 05/12/2018) Foot Exam (Last Completed 08/23/2019) COVID-19 Vaccine (4- Booster for Moderna series)  Star-Rating Drugs: Lisinopril 20 mg last filled 10/03/2020 [facility] day supply  [facility]. Atorvastatin 10 mg last filled 11/11/2020 90 day supply at [facility].   Patient's preferred pharmacy is:  [id number] [place], [address] [place]Phone: [phone]: [phone] [facility] [address] [address]Phone: [phone]: [phone] Uses pill box? Yes Pt endorses 100% compliance  We discussed: Current pharmacy is  preferred with insurance plan and patient is satisfied with pharmacy services Patient decided to: Continue current medication management strategy  Care Plan and Follow Up Patient Decision:  Patient agrees to Care Plan and Follow-up.  Plan: Telephone follow up appointment with care management team member scheduled for:  11/21/2021 at 3:00 PM  AJunius Argyle PharmD, BPara March CPP Clinical Pharmacist Practitioner  [facility] [phone]

## 2021-08-02 NOTE — Patient Instructions (Signed)
Visit Information It was great speaking with you today!  Please let me know if you have any questions about our visit.   Goals Addressed             This Visit's Progress    Track and Manage My Blood Pressure-Hypertension   On track    Timeframe:  Long-Range Goal Priority:  High Start Date:  02/08/21                           Expected End Date: 02/08/22                      Follow Up within 90 days   - check blood pressure weekly - write blood pressure results in a log or diary    Why is this important?   You won't feel high blood pressure, but it can still hurt your blood vessels.  High blood pressure can cause heart or kidney problems. It can also cause a stroke.  Making lifestyle changes like losing a little weight or eating less salt will help.  Checking your blood pressure at home and at different times of the day can help to control blood pressure.  If the doctor prescribes medicine remember to take it the way the doctor ordered.  Call the office if you cannot afford the medicine or if there are questions about it.     Notes:         Patient Care Plan: General Pharmacy (Adult)     Problem Identified: Hypertension, Hyperlipidemia, Diabetes, GERD, Asthma, Chronic Kidney Disease, Osteoarthritis, Gout, and Migraines   Priority: High     Long-Range Goal: Patient-Specific Goal   Start Date: 02/28/2021  Expected End Date: 02/28/2022  This Visit's Progress: On track  Recent Progress: On track  Priority: High  Note:   Current Barriers:  No barriers noted  Pharmacist Clinical Goal(s):  Patient will maintain control of blood pressure as evidenced by BP less than 140/90  through collaboration with PharmD and provider.   Interventions: 1:1 collaboration with Nche, Charlene Brooke, NP regarding development and update of comprehensive plan of care as evidenced by provider attestation and co-signature Inter-disciplinary care team collaboration (see longitudinal plan of  care) Comprehensive medication review performed; medication list updated in electronic medical record  Hypertension (BP goal <140/90) -Controlled -Current treatment: Lisinopril 20 mg daily -Medications previously tried: NA  -Current home readings: Not monitoring regularly -Denies hypotensive/hypertensive symptoms  -Recommended to continue current medication  Hyperlipidemia: (LDL goal < 100) -Controlled -Current treatment: Atorvastatin 10 mg daily  -Medications previously tried: NA -Recommended to continue current medication  Diabetes (A1c goal <7%) -Controlled -Current medications: Ozempic 0.5 mg weekly -Medications previously tried: Metformin  -Reports some appetite reduction since starting Ozempic. Reports 20+ lbs. weight loss. Wants to start water aerobics but lack of transportation is a limiting factor. She plans to reach out to her insurance to discuss transportation options.  -Recommended to continue current medication  Depression/Anxiety (Goal: Achieve symptom remission) -Controlled -Current treatment: Fluoxetine 10 mg daily: Appropriate, Effective, Safe, Accessible  -Medications previously tried/failed: Escitalopram -Low motivation in the morning. She is able to get up to get her grandson to school or go to work.  -Sleeps throughout the day for 1-2 hours at a time. Poor sleep at night due to poor pain control. Does not use her CPAP every night, states sometimes it "acts up" and wakes her up. She has a  follow-up with neurology soon to evaluate her CPAP settings.  -PHQ9: 3 today  -Recommended to continue current medication  Asthma (Goal: control symptoms and prevent exacerbations) -Controlled -Current treatment  Albuterol 1-2 puffs every 6 hours as needed Symbicort 2 puffs twice daily  -Medications previously tried: NA  -Exacerbations requiring treatment in last 6 months: None -Patient reports consistent use of maintenance inhaler -Frequency of rescue inhaler use:  2-3 times weekly, mostly when more active.  -Recommended to continue current medication  GERD (Goal: Prevent Heartburn/Reflux) -Controlled -Current treatment  Omeprazole 20 mg daily  -Medications previously tried: NA -Counseled on trigger avoidance.   -Recommended to continue current medication  Gout (Goal: Prevent gout flares) -Controlled -Last Gout Flare: 1 Year ago, both feet and legs  -Current treatment  Allopurinol 100 mg daily  -Medications previously tried: NA  -We discussed:  Counseled patient on low purine diet plan. Counseled patient to reduce consumption of high-fructose corn syrup, sweetened soft drinks, fruit juices, meat, and seafood. -Recommended to continue current medication  Chronic Pain (Goal: Minimize pain symptoms) -Controlled -Current treatment  Pregabalin 150 mg twice daily  Tramadol 50 mg three times daily  -Medications previously tried: NA  -Pain 8 or 9 without medicine. 5 or 6.  -Recommended to continue current medication  Migraines (Goal: Prevent migraines) -Controlled -Last month has had 3 migraines requiring treatment.  -Current treatment  Rimepgepant 75 mg once daily as needed for headaches  -Medications previously tried: NA  -Reports symptoms requiring treatment ~1x monthly.  -Recommended to continue current medication  Chronic Kidney Disease Stage 3b  -All medications assessed for renal dosing and appropriateness in chronic kidney disease. -Recommended to continue current medication  Patient Goals/Self-Care Activities Patient will:  - check blood pressure weekly, document, and provide at future appointments  Follow Up Plan: Telephone follow up appointment with care management team member scheduled for:  11/21/2021 at 3:00 PM    Patient agreed to services and verbal consent obtained.   The patient verbalized understanding of instructions, educational materials, and care plan provided today and DECLINED offer to receive copy of patient  instructions, educational materials, and care plan.   Junius Argyle, PharmD, Para March, CPP Clinical Pharmacist Practitioner  [facility]  [phone]

## 2021-08-12 ENCOUNTER — Other Ambulatory Visit: Payer: Self-pay | Admitting: Nurse Practitioner

## 2021-08-12 DIAGNOSIS — E1142 Type 2 diabetes mellitus with diabetic polyneuropathy: Secondary | ICD-10-CM

## 2021-08-12 NOTE — Telephone Encounter (Signed)
Last OV: 06/2021 Next OV: 10/2021

## 2021-08-25 ENCOUNTER — Emergency Department (HOSPITAL_COMMUNITY): Payer: Medicare Other

## 2021-08-25 ENCOUNTER — Emergency Department (HOSPITAL_COMMUNITY)
Admission: EM | Admit: 2021-08-25 | Discharge: 2021-08-25 | Disposition: A | Discharge status: Home / Self Care | Payer: Medicare Other | Attending: Emergency Medicine | Admitting: Emergency Medicine

## 2021-08-25 ENCOUNTER — Other Ambulatory Visit: Payer: Self-pay

## 2021-08-25 ENCOUNTER — Encounter (HOSPITAL_COMMUNITY): Payer: Self-pay

## 2021-08-25 DIAGNOSIS — Z7982 Long term (current) use of aspirin: Secondary | ICD-10-CM | POA: Insufficient documentation

## 2021-08-25 DIAGNOSIS — R079 Chest pain, unspecified: Secondary | ICD-10-CM | POA: Diagnosis not present

## 2021-08-25 DIAGNOSIS — R519 Headache, unspecified: Secondary | ICD-10-CM | POA: Diagnosis present

## 2021-08-25 DIAGNOSIS — R0602 Shortness of breath: Secondary | ICD-10-CM | POA: Insufficient documentation

## 2021-08-25 DIAGNOSIS — G43809 Other migraine, not intractable, without status migrainosus: Secondary | ICD-10-CM | POA: Insufficient documentation

## 2021-08-25 DIAGNOSIS — Z7951 Long term (current) use of inhaled steroids: Secondary | ICD-10-CM | POA: Diagnosis not present

## 2021-08-25 DIAGNOSIS — I10 Essential (primary) hypertension: Secondary | ICD-10-CM | POA: Diagnosis not present

## 2021-08-25 DIAGNOSIS — Z743 Need for continuous supervision: Secondary | ICD-10-CM | POA: Diagnosis not present

## 2021-08-25 DIAGNOSIS — Z79899 Other long term (current) drug therapy: Secondary | ICD-10-CM | POA: Insufficient documentation

## 2021-08-25 DIAGNOSIS — R6889 Other general symptoms and signs: Secondary | ICD-10-CM | POA: Diagnosis not present

## 2021-08-25 DIAGNOSIS — R072 Precordial pain: Secondary | ICD-10-CM | POA: Diagnosis not present

## 2021-08-25 DIAGNOSIS — S0990XA Unspecified injury of head, initial encounter: Secondary | ICD-10-CM | POA: Diagnosis not present

## 2021-08-25 DIAGNOSIS — J45909 Unspecified asthma, uncomplicated: Secondary | ICD-10-CM | POA: Insufficient documentation

## 2021-08-25 DIAGNOSIS — R404 Transient alteration of awareness: Secondary | ICD-10-CM | POA: Diagnosis not present

## 2021-08-25 DIAGNOSIS — R0789 Other chest pain: Secondary | ICD-10-CM | POA: Diagnosis not present

## 2021-08-25 LAB — URINALYSIS, ROUTINE W REFLEX MICROSCOPIC
Bilirubin Urine: NEGATIVE
Glucose, UA: NEGATIVE mg/dL
Hgb urine dipstick: NEGATIVE
Ketones, ur: NEGATIVE mg/dL
Leukocytes,Ua: NEGATIVE
Nitrite: NEGATIVE
Protein, ur: NEGATIVE mg/dL
Specific Gravity, Urine: 1.015 (ref 1.005–1.030)
pH: 6 (ref 5.0–8.0)

## 2021-08-25 LAB — COMPREHENSIVE METABOLIC PANEL
ALT: 10 U/L (ref 0–44)
AST: 31 U/L (ref 15–41)
Albumin: 3.4 g/dL — ABNORMAL LOW (ref 3.5–5.0)
Alkaline Phosphatase: 75 U/L (ref 38–126)
Anion gap: 11 (ref 5–15)
BUN: 11 mg/dL (ref 8–23)
CO2: 26 mmol/L (ref 22–32)
Calcium: 9.2 mg/dL (ref 8.9–10.3)
Chloride: 102 mmol/L (ref 98–111)
Creatinine, Ser: 1.26 mg/dL — ABNORMAL HIGH (ref 0.44–1.00)
GFR, Estimated: 45 mL/min — ABNORMAL LOW (ref >60)
Glucose, Bld: 107 mg/dL — ABNORMAL HIGH (ref 70–99)
Potassium: 4.1 mmol/L (ref 3.5–5.1)
Sodium: 139 mmol/L (ref 135–145)
Total Bilirubin: 0.9 mg/dL (ref 0.3–1.2)
Total Protein: 6.9 g/dL (ref 6.5–8.1)

## 2021-08-25 LAB — CBC WITH DIFFERENTIAL/PLATELET
Abs Immature Granulocytes: 0.01 10*3/uL (ref 0.00–0.07)
Basophils Absolute: 0 10*3/uL (ref 0.0–0.1)
Basophils Relative: 0 %
Eosinophils Absolute: 0.1 10*3/uL (ref 0.0–0.5)
Eosinophils Relative: 1 %
HCT: 38.3 % (ref 36.0–46.0)
Hemoglobin: 11.9 g/dL — ABNORMAL LOW (ref 12.0–15.0)
Immature Granulocytes: 0 %
Lymphocytes Relative: 43 %
Lymphs Abs: 2.4 10*3/uL (ref 0.7–4.0)
MCH: 28.6 pg (ref 26.0–34.0)
MCHC: 31.1 g/dL (ref 30.0–36.0)
MCV: 92.1 fL (ref 80.0–100.0)
Monocytes Absolute: 0.4 10*3/uL (ref 0.1–1.0)
Monocytes Relative: 7 %
Neutro Abs: 2.7 10*3/uL (ref 1.7–7.7)
Neutrophils Relative %: 49 %
Platelets: 209 10*3/uL (ref 150–400)
RBC: 4.16 MIL/uL (ref 3.87–5.11)
RDW: 13.4 % (ref 11.5–15.5)
WBC: 5.6 10*3/uL (ref 4.0–10.5)
nRBC: 0 % (ref 0.0–0.2)

## 2021-08-25 LAB — TROPONIN I (HIGH SENSITIVITY)
Troponin I (High Sensitivity): 8 ng/L (ref <18)
Troponin I (High Sensitivity): 7 ng/L (ref <18)

## 2021-08-25 LAB — LIPASE, BLOOD: Lipase: 30 U/L (ref 11–51)

## 2021-08-25 LAB — BRAIN NATRIURETIC PEPTIDE: B Natriuretic Peptide: 36.2 pg/mL (ref 0.0–100.0)

## 2021-08-25 MED ORDER — MORPHINE SULFATE (PF) 2 MG/ML IV SOLN
2.0000 mg | Freq: Once | INTRAVENOUS | Status: AC
  Administered 2021-08-25: 2 mg via INTRAVENOUS
  Filled 2021-08-25: qty 1

## 2021-08-25 MED ORDER — TRAMADOL HCL 50 MG PO TABS
50.0000 mg | ORAL_TABLET | Freq: Once | ORAL | Status: AC
  Administered 2021-08-25: 50 mg via ORAL
  Filled 2021-08-25: qty 1

## 2021-08-27 ENCOUNTER — Other Ambulatory Visit: Payer: Self-pay | Admitting: Nurse Practitioner

## 2021-08-27 DIAGNOSIS — I10 Essential (primary) hypertension: Secondary | ICD-10-CM

## 2021-08-29 ENCOUNTER — Encounter: Payer: Self-pay | Admitting: Dietician

## 2021-08-29 ENCOUNTER — Encounter: Payer: Medicare Other | Attending: Nurse Practitioner | Admitting: Dietician

## 2021-08-29 DIAGNOSIS — Z6841 Body Mass Index (BMI) 40.0 and over, adult: Secondary | ICD-10-CM | POA: Insufficient documentation

## 2021-08-29 DIAGNOSIS — E1142 Type 2 diabetes mellitus with diabetic polyneuropathy: Secondary | ICD-10-CM | POA: Insufficient documentation

## 2021-08-29 NOTE — Patient Instructions (Addendum)
Eye exam Appointment with someone about your dentures. Find a way to join the water aerobics class again. Drink more water (none or less sweetened beverages)  [place] yogurt and fruit for breakfast Lunch and dinner:  choose lean protein, vegetables, small portion starch (sweet potato, beans, brown rice, [employer] bread, etc)  Find ways to increase the vegetables and fruit in your diet.  Nutrition quality.  Care for yourself.

## 2021-08-29 NOTE — Progress Notes (Signed)
Diabetes Self-Management Education  Visit Type: First/Initial  Appt. Start Time: 0910 Appt. End Time: 1010  08/30/2021  [name], identified by name and date of birth, is a 76 y.o. female with a diagnosis of Diabetes: Type 2.   ASSESSMENT  Height _0  (1.499 m), weight 260 lb (117.9 kg). Body mass index is 52.51 kg/m.   Diabetes Self-Management Education - 08/29/21 0957       Visit Information   Visit Type First/Initial      Initial Visit   Diabetes Type Type 2    Date Diagnosed 2010    Are you currently following a meal plan? No    Are you taking your medications as prescribed? Yes      Health Coping   How would you rate your overall health? Good      Psychosocial Assessment   Patient Belief/Attitude about Diabetes Motivated to manage diabetes    What is the hardest part about your diabetes right now, causing you the most concern, or is the most worrisome to you about your diabetes?   Making healty food and beverage choices    Self-care barriers Debilitated state due to current medical condition;Lack of transportation;Unsteady gait/risk for falls    Self-management support Doctor's office    Other persons present Patient    Patient Concerns Nutrition/Meal planning;Weight Control    Special Needs None    Preferred Learning Style No preference indicated    Learning Readiness Ready    How often do you need to have someone help you when you read instructions, pamphlets, or other written materials from your doctor or pharmacy? 1 - Never    What is the last grade level you completed in school? PhD      Pre-Education Assessment   Patient understands the diabetes disease and treatment process. Needs Review    Patient understands incorporating nutritional management into lifestyle. Needs Review    Patient undertands incorporating physical activity into lifestyle. Needs Review    Patient understands using medications safely. Needs Review    Patient understands  monitoring blood glucose, interpreting and using results Needs Review    Patient understands prevention, detection, and treatment of acute complications. Needs Review    Patient understands prevention, detection, and treatment of chronic complications. Needs Review    Patient understands how to develop strategies to address psychosocial issues. Needs Review    Patient understands how to develop strategies to promote health/change behavior. Needs Review      Complications   Last HgB A1C per patient/outside source 5.7 %    How often do you check your blood sugar? 0 times/day (not testing)    Have you had a dilated eye exam in the past 12 months? No    Have you had a dental exam in the past 12 months? No      Dietary Intake   Breakfast skips or occasional strawberries    Snack (morning) Boost    Lunch Cream of Broccoli soup    Snack (afternoon) ritz crackers    Dinner mashed potatoes, gravy, chicken, mac and cheese   before 6   Snack (evening) Boost    Beverage(s) water, Boost, regular soda (16 oz per day), lactaid milk, occasional sweet tea rare coffee      Activity / Exercise   Activity / Exercise Type ADL's      Patient Education   Previous Diabetes Education Yes (please comment)   2022   Healthy Eating Meal options for control  of blood glucose level and chronic complications.;Role of diet in the treatment of diabetes and the relationship between the three main macronutrients and blood glucose level;Plate Method;Food label reading, portion sizes and measuring food.    Being Active Helped patient identify appropriate exercises in relation to his/her diabetes, diabetes complications and other health issue.    Medications Reviewed patients medication for diabetes, action, purpose, timing of dose and side effects.    Monitoring Daily foot exams;Yearly dilated eye exam;Identified appropriate SMBG and/or A1C goals.;Taught/discussed recording of test results and interpretation of SMBG.     Diabetes Stress and Support Identified and addressed patients feelings and concerns about diabetes;Worked with patient to identify barriers to care and solutions;Role of stress on diabetes      Individualized Goals (developed by patient)   Nutrition General guidelines for healthy choices and portions discussed    Physical Activity Exercise 3-5 times per week;15 minutes per day    Medications take my medication as prescribed    Problem Solving Eating Pattern    Reducing Risk examine blood glucose patterns;do foot checks daily      Post-Education Assessment   Patient understands the diabetes disease and treatment process. Demonstrates understanding / competency    Patient understands incorporating nutritional management into lifestyle. Comprehends key points    Patient undertands incorporating physical activity into lifestyle. Demonstrates understanding / competency    Patient understands using medications safely. Demonstrates understanding / competency    Patient understands monitoring blood glucose, interpreting and using results Demonstrates understanding / competency    Patient understands prevention, detection, and treatment of acute complications. Demonstrates understanding / competency    Patient understands prevention, detection, and treatment of chronic complications. Demonstrates understanding / competency    Patient understands how to develop strategies to address psychosocial issues. Comprehends key points    Patient understands how to develop strategies to promote health/change behavior. Comprehends key points      Outcomes   Expected Outcomes Demonstrated interest in learning. Expect positive outcomes    Future DMSE 3-4 months    Program Status Not Completed             Individualized Plan for Diabetes Self-Management Training:   Learning Objective:  Patient will have a greater understanding of diabetes self-management. Patient education plan is to attend individual  and/or group sessions per assessed needs and concerns.   Plan:   Patient Instructions  Eye exam Appointment with someone about your dentures. Find a way to join the water aerobics class again. Drink more water (none or less sweetened beverages)  [place] yogurt and fruit for breakfast Lunch and dinner:  choose lean protein, vegetables, small portion starch (sweet potato, beans, brown rice, [employer] bread, etc)  Find ways to increase the vegetables and fruit in your diet.  Nutrition quality.  Care for yourself. Expected Outcomes:  Demonstrated interest in learning. Expect positive outcomes  Education material provided: Meal plan card and Diabetes Resources  If problems or questions, patient to contact team via:  Phone  Future DSME appointment: 3-4 months

## 2021-08-30 ENCOUNTER — Encounter: Payer: Self-pay | Admitting: Dietician

## 2021-08-30 DIAGNOSIS — I10 Essential (primary) hypertension: Secondary | ICD-10-CM | POA: Diagnosis not present

## 2021-08-30 DIAGNOSIS — Z7985 Long-term (current) use of injectable non-insulin antidiabetic drugs: Secondary | ICD-10-CM

## 2021-08-30 DIAGNOSIS — E785 Hyperlipidemia, unspecified: Secondary | ICD-10-CM | POA: Diagnosis not present

## 2021-08-30 DIAGNOSIS — F32A Depression, unspecified: Secondary | ICD-10-CM

## 2021-08-30 DIAGNOSIS — E1159 Type 2 diabetes mellitus with other circulatory complications: Secondary | ICD-10-CM | POA: Diagnosis not present

## 2021-08-30 DIAGNOSIS — J45909 Unspecified asthma, uncomplicated: Secondary | ICD-10-CM

## 2021-09-01 ENCOUNTER — Other Ambulatory Visit: Payer: Self-pay | Admitting: Nurse Practitioner

## 2021-09-01 DIAGNOSIS — E782 Mixed hyperlipidemia: Secondary | ICD-10-CM

## 2021-09-02 NOTE — Telephone Encounter (Signed)
Chart Supports Rx Last OV: 07/2021 Next OV: 10/2021

## 2021-09-25 ENCOUNTER — Other Ambulatory Visit: Payer: Self-pay | Admitting: Nurse Practitioner

## 2021-09-25 DIAGNOSIS — K2100 Gastro-esophageal reflux disease with esophagitis, without bleeding: Secondary | ICD-10-CM

## 2021-09-25 NOTE — Telephone Encounter (Signed)
Chart supports Rx Last OV: 07/2021 Next OV: 10/2021

## 2021-09-30 LAB — HM DIABETES EYE EXAM

## 2021-10-01 ENCOUNTER — Encounter: Payer: Self-pay | Admitting: Nurse Practitioner

## 2021-10-01 ENCOUNTER — Ambulatory Visit (INDEPENDENT_AMBULATORY_CARE_PROVIDER_SITE_OTHER): Payer: Medicare Other | Admitting: Nurse Practitioner

## 2021-10-01 VITALS — BP 142/78 | HR 76 | Temp 98.2°F | Ht 59.0 in | Wt 269.4 lb

## 2021-10-01 DIAGNOSIS — I10 Essential (primary) hypertension: Secondary | ICD-10-CM | POA: Diagnosis not present

## 2021-10-01 DIAGNOSIS — R0989 Other specified symptoms and signs involving the circulatory and respiratory systems: Secondary | ICD-10-CM | POA: Diagnosis not present

## 2021-10-01 DIAGNOSIS — R1313 Dysphagia, pharyngeal phase: Secondary | ICD-10-CM | POA: Diagnosis not present

## 2021-10-01 DIAGNOSIS — E66813 Obesity, class 3: Secondary | ICD-10-CM

## 2021-10-01 DIAGNOSIS — E1142 Type 2 diabetes mellitus with diabetic polyneuropathy: Secondary | ICD-10-CM | POA: Diagnosis not present

## 2021-10-01 DIAGNOSIS — Z6841 Body Mass Index (BMI) 40.0 and over, adult: Secondary | ICD-10-CM

## 2021-10-01 LAB — POCT GLYCOSYLATED HEMOGLOBIN (HGB A1C): Hemoglobin A1C: 5.7 % — AB (ref 4.0–5.6)

## 2021-10-01 MED ORDER — OZEMPIC (0.25 OR 0.5 MG/DOSE) 2 MG/1.5ML ~~LOC~~ SOPN
0.5000 mg | PEN_INJECTOR | SUBCUTANEOUS | 2 refills | Status: DC
Start: 2021-10-01 — End: 2022-01-13

## 2021-10-01 NOTE — Patient Instructions (Signed)
You will be contacted to schedule appt with GI and allergist Maintain current medications Submit SCAT form to help with transportation.

## 2021-10-01 NOTE — Assessment & Plan Note (Addendum)
Worsening difficulty with swallowing large pills. Also reports persistent mucus production, worse in AM and at hs. Hx of GERD: controlled with omeprazole Hx of asthma: stable with symbicort, albuterol and zyrtec. HTN managed with lisinopril Use of CPAP nightly: she cleans daily with luke warm water and soap, airdry tubes Laryngoscopy 2019: Oropharynx without fb seen or redness or swelling- epiglottis with some mild edema noted, no erythema.  GERD vs med side effects (symbicort or lisinopril)? Agreed to GI and allergy referral

## 2021-10-01 NOTE — Progress Notes (Signed)
Established Patient Visit  Patient: Sandra Brennan   DOB: 02-09-1946   76 y.o. Female  MRN: [id number] Visit Date: 10/01/2021  Subjective:    Chief Complaint  Patient presents with   Office Visit    DM/ HTN / hyperlipidemia F/u  No concerns    HPI Type 2 diabetes mellitus with diabetic polyneuropathy, without long-term current use of insulin ([place]) No adverse effects with ozempic 0.'5mg'$  weekly Continues to struggle with maintaining low carb/low sugar diet. She eats 26mal per day and several snacks per day. Repeat hgbA1c: 5.7% Controlled DM  Maintain med dose. Advised about the importance of importance of heart healthy balanced diet  Obesity Limited exercise due to neuropathy and advance osteoarthritis. Advised to resume water aerobics. She agreed to submit SCAT application to facilitate transportation. Advised about necessary dietary modifications Wt Readings from Last 3 Encounters:  10/01/21 269 lb 6.4 oz (122.2 kg)  08/29/21 260 lb (117.9 kg)  08/25/21 260 lb (117.9 kg)    Hypertension Elevated BP this Am because she has not taken Am dose. Advised to take AM BP meds despite need to fast for labs. BP Readings from Last 3 Encounters:  10/01/21 (!) 142/78  08/25/21 (!) 195/87  07/18/21 119/80   Maintain med doses  Pharyngeal dysphagia Worsening difficulty with swallowing large pills. Also reports persistent mucus production, worse in AM and at hs. Hx of GERD: controlled with omeprazole Hx of asthma: stable with symbicort, albuterol and zyrtec. HTN managed with lisinopril Use of CPAP nightly: she cleans daily with luke warm water and soap, airdry tubes Laryngoscopy 2019: Oropharynx without fb seen or redness or swelling- epiglottis with some mild edema noted, no erythema.  GERD vs med side effects (symbicort or lisinopril)? Agreed to GI and allergy referral    Normal CXR, BNP and troponin (08/2021): no evidence of fluid overload  Reviewed medical,  surgical, and social history today  Medications: Outpatient Medications Prior to Visit  Medication Sig   albuterol (VENTOLIN HFA) 108 (90 Base) MCG/ACT inhaler INHALE 1 PUFF INTO THE LUNGS EVERY 6 (SIX) HOURS AS NEEDED FOR SHORTNESS OF BREATH OR WHEEZING.   allopurinol (ZYLOPRIM) 100 MG tablet Take 1 tablet (100 mg total) by mouth daily.   aspirin EC 81 MG tablet Take 81 mg by mouth daily.   atorvastatin (LIPITOR) 10 MG tablet TAKE 1 TABLET BY MOUTH  DAILY AT 6 PM.   Carboxymethylcellulose Sodium (EYE DROPS OP) Apply to eye. Lubricant eye drops   cetirizine (ZYRTEC) 10 MG tablet Take 10 mg by mouth daily as needed.   cholecalciferol (VITAMIN D3) 25 MCG (1000 UNIT) tablet Take 1,000 Units by mouth daily.   dextromethorphan-guaiFENesin (MUCINEX DM) 30-600 MG 12hr tablet Take 1 tablet by mouth 2 (two) times daily.   lisinopril (ZESTRIL) 20 MG tablet Take 1 tablet (20 mg total) by mouth daily.   Multiple Vitamin (MULTIVITAMIN) tablet Take 1 tablet by mouth daily.   omeprazole (PRILOSEC) 20 MG capsule TAKE 1 CAPSULE BY MOUTH EVERY DAY   pregabalin (LYRICA) 150 MG capsule Take 1 capsule (150 mg total) by mouth 2 (two) times daily.   Rimegepant Sulfate (NURTEC) 75 MG TBDP Take 75 mg by mouth as needed (take 1 at onset of headache, max is 75 in 24 hours).   Sennosides 8.6 MG CAPS Take 1 capsule by mouth daily.   SYMBICORT 160-4.5 MCG/ACT inhaler Inhale 2 puffs into the lungs in  the morning and at bedtime. Rinse mouth after each use   traMADol (ULTRAM) 50 MG tablet Take 1 tablet (50 mg total) by mouth 3 (three) times daily.   [DISCONTINUED] Semaglutide,0.25 or 0.'5MG'$ /DOS, (OZEMPIC, 0.25 OR 0.5 MG/DOSE,) 2 MG/1.5ML SOPN Inject 0.5 mg into the skin once a week.   FLUoxetine (PROZAC) 10 MG capsule Take 1 capsule (10 mg total) by mouth daily. (Patient not taking: Reported on 10/01/2021)   [DISCONTINUED] Semaglutide,0.25 or 0.'5MG'$ /DOS, (OZEMPIC, 0.25 OR 0.5 MG/DOSE,) 2 MG/3ML SOPN INJECT 0.'25MG'$  INTO THE SKIN  ONE TIME PER WEEK (Patient not taking: Reported on 10/01/2021)   No facility-administered medications prior to visit.   Reviewed past medical and social history.   ROS per HPI above  Last CBC Lab Results  Component Value Date   WBC 5.6 08/25/2021   HGB 11.9 (L) 08/25/2021   HCT 38.3 08/25/2021   MCV 92.1 08/25/2021   MCH 28.6 08/25/2021   RDW 13.4 08/25/2021   PLT 209 96/06/5407   Last metabolic panel Lab Results  Component Value Date   GLUCOSE 107 (H) 08/25/2021   NA 139 08/25/2021   K 4.1 08/25/2021   CL 102 08/25/2021   CO2 26 08/25/2021   BUN 11 08/25/2021   CREATININE 1.26 (H) 08/25/2021   GFRNONAA 45 (L) 08/25/2021   CALCIUM 9.2 08/25/2021   PHOS 3.7 08/23/2020   PROT 6.9 08/25/2021   ALBUMIN 3.4 (L) 08/25/2021   LABGLOB 2.6 07/14/2014   AGRATIO 1.7 07/14/2014   BILITOT 0.9 08/25/2021   ALKPHOS 75 08/25/2021   AST 31 08/25/2021   ALT 10 08/25/2021   ANIONGAP 11 08/25/2021   Last hemoglobin A1c Lab Results  Component Value Date   HGBA1C 5.7 (A) 10/01/2021        Objective:  BP (!) 142/78 (BP Location: Left Arm, Patient Position: Sitting, Cuff Size: Normal)   Pulse 76   Temp 98.2 F (36.8 C) (Temporal)   Ht '4\' 11"'$  (1.499 m)   Wt 269 lb 6.4 oz (122.2 kg)   SpO2 99%   BMI 54.41 kg/m      Physical Exam Constitutional:      Appearance: She is obese.  Cardiovascular:     Rate and Rhythm: Normal rate and regular rhythm.     Pulses: Normal pulses.     Heart sounds: Normal heart sounds.  Pulmonary:     Effort: Pulmonary effort is normal.     Breath sounds: Normal breath sounds.  Musculoskeletal:     Right lower leg: Edema present.     Left lower leg: Edema present.  Neurological:     Mental Status: She is alert and oriented to person, place, and time.     Results for orders placed or performed in visit on 10/01/21  POCT glycosylated hemoglobin (Hb A1C)  Result Value Ref Range   Hemoglobin A1C 5.7 (A) 4.0 - 5.6 %      Assessment & Plan:     Problem List Items Addressed This Visit       Cardiovascular and Mediastinum   Hypertension - Primary (Chronic)    Elevated BP this Am because she has not taken Am dose. Advised to take AM BP meds despite need to fast for labs. BP Readings from Last 3 Encounters:  10/01/21 (!) 142/78  08/25/21 (!) 195/87  07/18/21 119/80   Maintain med doses        Respiratory   Pharyngeal dysphagia    Worsening difficulty with swallowing large pills. Also  reports persistent mucus production, worse in AM and at hs. Hx of GERD: controlled with omeprazole Hx of asthma: stable with symbicort, albuterol and zyrtec. HTN managed with lisinopril Use of CPAP nightly: she cleans daily with luke warm water and soap, airdry tubes Laryngoscopy 2019: Oropharynx without fb seen or redness or swelling- epiglottis with some mild edema noted, no erythema.  GERD vs med side effects (symbicort or lisinopril)? Agreed to GI and allergy referral      Relevant Orders   Ambulatory referral to Gastroenterology     Endocrine   Type 2 diabetes mellitus with diabetic polyneuropathy, without long-term current use of insulin ([facility]) (Chronic)    No adverse effects with ozempic 0.'5mg'$  weekly Continues to struggle with maintaining low carb/low sugar diet. She eats 66mal per day and several snacks per day. Repeat hgbA1c: 5.7% Controlled DM  Maintain med dose. Advised about the importance of importance of heart healthy balanced diet      Relevant Medications   Semaglutide,0.25 or 0.'5MG'$ /DOS, (OZEMPIC, 0.25 OR 0.5 MG/DOSE,) 2 MG/1.5ML SOPN   Other Relevant Orders   POCT glycosylated hemoglobin (Hb A1C) (Completed)     Other   Obesity    Limited exercise due to neuropathy and advance osteoarthritis. Advised to resume water aerobics. She agreed to submit SCAT application to facilitate transportation. Advised about necessary dietary modifications Wt Readings from Last 3 Encounters:  10/01/21 269 lb 6.4 oz (122.2 kg)   08/29/21 260 lb (117.9 kg)  08/25/21 260 lb (117.9 kg)        Relevant Medications   Semaglutide,0.25 or 0.'5MG'$ /DOS, (OZEMPIC, 0.25 OR 0.5 MG/DOSE,) 2 MG/1.5ML SOPN   Other Visit Diagnoses     Chronic throat clearing       Relevant Orders   Ambulatory referral to Allergy      Return in about 3 months (around 01/01/2022) for DM and HTN, hyperlipidemia (fasting).     CWilfred Lacy NP

## 2021-10-01 NOTE — Assessment & Plan Note (Signed)
Elevated BP this Am because she has not taken Am dose. Advised to take AM BP meds despite need to fast for labs. BP Readings from Last 3 Encounters:  10/01/21 (!) 142/78  08/25/21 (!) 195/87  07/18/21 119/80   Maintain med doses

## 2021-10-01 NOTE — Assessment & Plan Note (Addendum)
No adverse effects with ozempic 0.'5mg'$  weekly Continues to struggle with maintaining low carb/low sugar diet. She eats 63mal per day and several snacks per day. Repeat hgbA1c: 5.7% Controlled DM  Maintain med dose. Advised about the importance of importance of heart healthy balanced diet

## 2021-10-01 NOTE — Assessment & Plan Note (Signed)
Limited exercise due to neuropathy and advance osteoarthritis. Advised to resume water aerobics. She agreed to submit SCAT application to facilitate transportation. Advised about necessary dietary modifications Wt Readings from Last 3 Encounters:  10/01/21 269 lb 6.4 oz (122.2 kg)  08/29/21 260 lb (117.9 kg)  08/25/21 260 lb (117.9 kg)

## 2021-10-01 NOTE — Assessment & Plan Note (Signed)
>>  ASSESSMENT AND PLAN FOR OBESITY WRITTEN ON 10/01/2021 11:59 AM BY Jaelen Gellerman LUM, NP  Limited exercise due to neuropathy and advance osteoarthritis. Advised to resume water aerobics. She agreed to submit SCAT application to facilitate transportation. Advised about necessary dietary modifications Wt Readings from Last 3 Encounters:  10/01/21 269 lb 6.4 oz (122.2 kg)  08/29/21 260 lb (117.9 kg)  08/25/21 260 lb (117.9 kg)

## 2021-10-08 DIAGNOSIS — G4733 Obstructive sleep apnea (adult) (pediatric): Secondary | ICD-10-CM | POA: Diagnosis not present

## 2021-10-15 ENCOUNTER — Encounter: Payer: Medicare Other | Attending: Physical Medicine & Rehabilitation | Admitting: Registered Nurse

## 2021-10-15 ENCOUNTER — Encounter: Payer: Self-pay | Admitting: Registered Nurse

## 2021-10-15 VITALS — BP 144/81 | HR 83 | Ht 59.0 in | Wt 281.0 lb

## 2021-10-15 DIAGNOSIS — G8929 Other chronic pain: Secondary | ICD-10-CM | POA: Insufficient documentation

## 2021-10-15 DIAGNOSIS — G894 Chronic pain syndrome: Secondary | ICD-10-CM | POA: Diagnosis not present

## 2021-10-15 DIAGNOSIS — G5793 Unspecified mononeuropathy of bilateral lower limbs: Secondary | ICD-10-CM | POA: Diagnosis not present

## 2021-10-15 DIAGNOSIS — M797 Fibromyalgia: Secondary | ICD-10-CM | POA: Diagnosis not present

## 2021-10-15 DIAGNOSIS — M7062 Trochanteric bursitis, left hip: Secondary | ICD-10-CM | POA: Insufficient documentation

## 2021-10-15 DIAGNOSIS — Z5181 Encounter for therapeutic drug level monitoring: Secondary | ICD-10-CM | POA: Insufficient documentation

## 2021-10-15 DIAGNOSIS — M48061 Spinal stenosis, lumbar region without neurogenic claudication: Secondary | ICD-10-CM | POA: Diagnosis not present

## 2021-10-15 DIAGNOSIS — M546 Pain in thoracic spine: Secondary | ICD-10-CM | POA: Diagnosis not present

## 2021-10-15 DIAGNOSIS — Z79891 Long term (current) use of opiate analgesic: Secondary | ICD-10-CM | POA: Insufficient documentation

## 2021-10-15 DIAGNOSIS — M7061 Trochanteric bursitis, right hip: Secondary | ICD-10-CM | POA: Diagnosis not present

## 2021-10-15 NOTE — Patient Instructions (Signed)
You May Increase Your Tramadol to 4 times a day as needed for pain.   Please call the office in a week  ( Monday), with update on the medication change.   Speed

## 2021-10-15 NOTE — Progress Notes (Signed)
Subjective:    Patient ID: Sandra Brennan, female    DOB: [birth year], 76 y.o.   MRN: [id number]  HPI: Sandra Brennan is a 76 y.o. female who returns for follow up appointment for chronic pain and medication refill. She states her pain is located in her  left shoulder, mid- lower back , bilateral hip pain R>L and neuropathic pain in her bilateral lower extremities and bilateral feet. also reports her pain is not being controlled with current medication regimen, she is only receiving 2- 3 hours of pain relief. We will increase her Tramadol frequency to 4 times a day as needed for pain, she was also instructed to call office in a week with an update, she verbalizes understanding. She rates her pain 7. Her current exercise regime is walking short distances with walker.  [name] Morphine equivalent is 15.00 MME.   Oral Swab was Performed today.    Pain Inventory Average Pain 8 Pain Right Now 7 My pain is sharp, stabbing, and aching  In the last 24 hours, has pain interfered with the following? General activity 5 Relation with others 5 Enjoyment of life 8 What TIME of day is your pain at its worst? morning , daytime, and night Sleep (in general) Poor  Pain is worse with: walking, inactivity, and standing Pain improves with: medication Relief from Meds: 5  Family History  Problem Relation Age of Onset   Arthritis Mother    Arthritis Father    Colon cancer Father    Diabetes Sister    Arthritis Sister    Diabetes Maternal Grandmother    Arthritis Maternal Grandmother    Diabetes Maternal Grandfather    Arthritis Maternal Grandfather    Migraines Neg Hx    Sleep apnea Neg Hx    Social History   Socioeconomic History   Marital status: Widowed    Spouse name: Not on file   Number of children: 5   Years of education: PhD   Highest education level: Not on file  Occupational History   Occupation: Retired  Tobacco Use   Smoking status: Former    Packs/day: 0.12    Years:  2.00    Total pack years: 0.24    Types: Cigarettes   Smokeless tobacco: Never   Tobacco comments:    08/03/2012 "quit 30 years ago"  Vaping Use   Vaping Use: Never used  Substance and Sexual Activity   Alcohol use: No    Alcohol/week: 0.0 standard drinks of alcohol    Comment: 08/03/2012 "used to be a beeralcoholic"; stopped ~ 30 yr ago"   Drug use: No   Sexual activity: Never  Other Topics Concern   Not on file  Social History Narrative   Lives at home with her mother and caregiver.- mother past away 01/2021 and now lives alone.   Occasional use of caffeine.   Right-handed.   Social Determinants of Health   Financial Resource Strain: Low Risk  (02/08/2021)   Overall Financial Resource Strain (CARDIA)    Difficulty of Paying Living Expenses: Not hard at all  Food Insecurity: No Food Insecurity (11/13/2020)   Hunger Vital Sign    Worried About Running Out of Food in the Last Year: Never true    Ran Out of Food in the Last Year: Never true  Transportation Needs: No Transportation Needs (11/13/2020)   PRAPARE - Hydrologist (Medical): No    Lack of Transportation (Non-Medical): No  Physical  Activity: Inactive (11/13/2020)   Exercise Vital Sign    Days of Exercise per Week: 0 days    Minutes of Exercise per Session: 0 min  Stress: No Stress Concern Present (11/13/2020)   [place]    Feeling of Stress : Not at all  Social Connections: Moderately Integrated (11/13/2020)   Social Connection and Isolation Panel [NHANES]    Frequency of Communication with Friends and Family: Three times a week    Frequency of Social Gatherings with Friends and Family: Once a week    Attends Religious Services: More than 4 times per year    Active Member of Clubs or Organizations: Yes    Attends Music therapist: More than 4 times per year    Marital Status: Divorced   Past Surgical History:   Procedure Laterality Date   ABDOMINAL HYSTERECTOMY  1990's   GANGLION CYST EXCISION Right 1980's   "wrist" (2012/08/14)   JOINT REPLACEMENT     KNEE LIGAMENT RECONSTRUCTION Right 1980's   TONSILLECTOMY  ~ [place] Left Aug 14, 2012   TOTAL HIP ARTHROPLASTY Left 2012-08-14   Procedure: TOTAL HIP ARTHROPLASTY;  Surgeon: Garald Balding, MD;  Location: [place];  Service: Orthopedics;  Laterality: Left;  Left Total Hip Arthroplasty   TOTAL HIP ARTHROPLASTY Left 08/28/2012   Procedure: Irrigation and Debridement hip ;  Surgeon: Mcarthur Rossetti, MD;  Location: [place];  Service: Orthopedics;  Laterality: Left;   TOTAL KNEE ARTHROPLASTY Left 2001   TOTAL KNEE ARTHROPLASTY Right 2004   Past Surgical History:  Procedure Laterality Date   ABDOMINAL HYSTERECTOMY  1990's   GANGLION CYST EXCISION Right 1980's   "wrist" (2012/08/14)   JOINT REPLACEMENT     KNEE LIGAMENT RECONSTRUCTION Right 1980's   TONSILLECTOMY  ~ [place] Left 08/14/2012   TOTAL HIP ARTHROPLASTY Left 08/14/2012   Procedure: TOTAL HIP ARTHROPLASTY;  Surgeon: Garald Balding, MD;  Location: [place];  Service: Orthopedics;  Laterality: Left;  Left Total Hip Arthroplasty   TOTAL HIP ARTHROPLASTY Left 08/28/2012   Procedure: Irrigation and Debridement hip ;  Surgeon: Mcarthur Rossetti, MD;  Location: [place];  Service: Orthopedics;  Laterality: Left;   TOTAL KNEE ARTHROPLASTY Left 2001   TOTAL KNEE ARTHROPLASTY Right 2004   Past Medical History:  Diagnosis Date   Acute nontraumatic kidney injury ([place]) 05/05/2013   Acute posthemorrhagic anemia 09/06/2012   Anemia    Arthritis    "plenty" (14-Aug-2012)   Asthma    Chest pain 03/30/2016   Chronic lower back pain    "they say I need a whole new spinal column" (08/14/2012)   COPD (chronic obstructive pulmonary disease) (HCC)    Degenerative arthritis    "all over" (14-Aug-2012)   Fx ankle    GERD (gastroesophageal reflux disease)    Gout 05/05/2013    Hypertension    Migraines    "used to; totally stopped when I quit drinking 30 yr ago" (August 14, 2012)   Myalgia and myositis    Myocardial infarction [facility]) 1992   08-14-12 "mild MI when son died "   Obesity    Osteoporosis    "all over" (August 14, 2012)   Shortness of breath    when stressed.   Sleep apnea    "never did fix my machine; haven't used one for 5 years; I've lost 116# since then; no problems now" (08-14-2012)   Type II diabetes mellitus (  [place])    "borderline; don't test; take Metformin" (08/03/2012)   BP (!) 144/81   Pulse 83   Ht '4\' 11"'$  (1.499 m)   Wt 281 lb (127.5 kg)   SpO2 93%   BMI 56.76 kg/m   Opioid Risk Score:   Fall Risk Score:  `1  Depression screen [facility] 2/9     10/15/2021   10:24 AM 08/29/2021    9:26 AM 08/01/2021    4:27 PM 07/01/2021    8:55 AM 06/03/2021   10:39 AM 04/18/2021   11:25 AM 02/07/2021    3:09 PM  Depression screen PHQ 2/9  Decreased Interest 0 0 1 0 0 1 2  Down, Depressed, Hopeless 0 0 0 0 0 1 0  PHQ - 2 Score 0 0 1 0 0 2 2  Altered sleeping   '2 3 3  1  '$ Tired, decreased energy   0 1 0  3  Change in appetite   0 '3 1  3  '$ Feeling bad or failure about yourself    0 0 0  0  Trouble concentrating   0 0 0  0  Moving slowly or fidgety/restless   0 0 0  0  Suicidal thoughts   0 0 0  0  PHQ-9 Score   '3 7 4  9  '$ Difficult doing work/chores   Somewhat difficult Not difficult at all Not difficult at all  Somewhat difficult      Review of Systems  Musculoskeletal:  Positive for back pain.       Bilateral shoulder pain Bilateral wrist pain Bilateral top of foot pain Bilateral leg pain  Neurological:  Positive for weakness.  All other systems reviewed and are negative.     Objective:   Physical Exam Vitals and nursing note reviewed.  Constitutional:      Appearance: Normal appearance. She is obese.  Cardiovascular:     Rate and Rhythm: Normal rate and regular rhythm.     Pulses: Normal pulses.     Heart sounds: Normal heart sounds.  Pulmonary:      Effort: Pulmonary effort is normal.     Breath sounds: Normal breath sounds.  Musculoskeletal:     Cervical back: Normal range of motion and neck supple.     Right lower leg: Edema present.     Left lower leg: Edema present.     Comments: Normal Muscle Bulk and Muscle Testing Reveals:  Upper Extremities: Full ROM and Muscle Strength 5/5 Bilateral AC Joint Tenderness Thoracic Paraspinal Tenderness: T-7-T-9 Lumbar Paraspinal Tenderness: L-3-L-5 Bilateral Greater Trochanter Tenderness Lower Extremities: Full ROM and Muscle Strength 5/5 Arises from Chair slowly slowly using walker for support Antalgic  Gait     Skin:    General: Skin is warm and dry.  Neurological:     Mental Status: She is alert and oriented to person, place, and time.  Psychiatric:        Mood and Affect: Mood normal.        Behavior: Behavior normal.         Assessment & Plan:  1. Chronic  Bilateral Shoulder Pain: Continue HEP as Tolerated.  Continue to Monitor. 10/15/2021 2. Lumbar Spinal Stenosis: Continue HEP as Tolerated . Continue to Monitor. 10/15/2021 3. Fibromyalgia: Continue HEP as Tolerated. Continue to Monitor. 10/15/2021 4. Bilateral Greater Trochanter Bursitis: Continue to alternate Ice and Heat Therapy. Continue to Monitor. 10/15/2021. 5. Bilateral Lower Extremities with Neuropathic Pain: Continue Pregablin. Continue to Monitor. 10/15/2021  6.. Chronic Pain Syndrome: Continue : Increased : Tramadol 50 mg one tablet QID as needed for pain. She will call office on Monday with update on Medication change. She verbalizes understanding.  We will continue the opioid monitoring program, this consists of regular clinic visits, examinations, urine drug screen, pill counts as well as use of [place] Controlled Substance Reporting system. A 12 month History has been reviewed on the [place] Controlled Substance Reporting System on 10/15/2021.  Continue to monitor.    F/U in 3 months

## 2021-10-18 LAB — DRUG TOX MONITOR 1 W/CONF, ORAL FLD

## 2021-10-18 LAB — DRUG TOX ALC METAB W/CON, ORAL FLD: Alcohol Metabolite: NEGATIVE ng/mL (ref <25)

## 2021-10-22 ENCOUNTER — Telehealth: Payer: Self-pay | Admitting: *Deleted

## 2021-10-22 NOTE — Telephone Encounter (Signed)
Oral swab drug screen was consistent for prescribed medications.  ?

## 2021-11-06 ENCOUNTER — Other Ambulatory Visit: Payer: Self-pay | Admitting: Nurse Practitioner

## 2021-11-06 DIAGNOSIS — F339 Major depressive disorder, recurrent, unspecified: Secondary | ICD-10-CM

## 2021-11-06 NOTE — Telephone Encounter (Signed)
Chart supports Rx Last OV: 10/2021 Next OV: 11/2021

## 2021-11-21 ENCOUNTER — Ambulatory Visit (INDEPENDENT_AMBULATORY_CARE_PROVIDER_SITE_OTHER): Payer: Medicare Other

## 2021-11-21 DIAGNOSIS — F339 Major depressive disorder, recurrent, unspecified: Secondary | ICD-10-CM

## 2021-11-21 DIAGNOSIS — E1142 Type 2 diabetes mellitus with diabetic polyneuropathy: Secondary | ICD-10-CM

## 2021-11-21 NOTE — Progress Notes (Signed)
Chronic Care Management Pharmacy Note  11/21/2021 Name:  Sandra Brennan MRN:  [id number] DOB:  [birth year]  Summary: Patient presents for CCM follow-up.  Stopped fluoxetine approximately 3 months , felt like it made her depression worse. Took the medication for two weeks. Felt more fatigue and anhedonia symptoms.   Recommendations/Changes made from today's visit: Continue current medications   Plan: CPP follow-up 6 months  Subjective: Sandra Brennan is an 76 y.o. year old female who is a primary patient of Nche, Charlene Brooke, NP.  The CCM team was consulted for assistance with disease management and care coordination needs.    Engaged with patient by telephone for follow up visit in response to provider referral for pharmacy case management and/or care coordination services.   Consent to Services:  The patient was given information about Chronic Care Management services, agreed to services, and gave verbal consent prior to initiation of services.  Please see initial visit note for detailed documentation.   Patient Care Team: Nche, Charlene Brooke, NP as PCP - General (Internal Medicine) Garald Balding, MD as Consulting Physician (Orthopedic Surgery) Germaine Pomfret, [facility] as Pharmacist (Pharmacist)  Recent office visits: 10/01/21: Patient presented to Wilfred Lacy, NP for follow-up. Ozempic 0.5 mg weekly 07/01/21: Patient presented to Wilfred Lacy, NP for follow-up. Ozempic 0.5 mg weekly 06/03/21:  Patient presented to Wilfred Lacy, NP for follow-up. Ozempic 0.25 mg weekly Recent consult visits: 07/18/21: Patient presented to Dr. Letta Pate (physical medicine) for follow-up.   Hospital visits: 05/19/21: Patient presented to ED for hip pain.    Objective:  Lab Results  Component Value Date   CREATININE 1.26 (H) 08/25/2021   BUN 11 08/25/2021   GFR 48.67 (L) 04/05/2021   GFRNONAA 45 (L) 08/25/2021   GFRAA 46 (L) 03/07/2019   NA 139 08/25/2021   K 4.1 08/25/2021    CALCIUM 9.2 08/25/2021   CO2 26 08/25/2021   GLUCOSE 107 (H) 08/25/2021    Lab Results  Component Value Date/Time   HGBA1C 5.7 (A) 10/01/2021 09:41 AM   HGBA1C 5.7 04/05/2021 09:54 AM   HGBA1C 6.4 (A) 10/03/2020 09:31 AM   HGBA1C 6.4 10/03/2020 09:31 AM   HGBA1C 6.2 03/26/2020 02:15 PM   GFR 48.67 (L) 04/05/2021 09:54 AM   GFR 40.49 (L) 09/10/2020 12:03 PM   MICROALBUR 1.5 06/03/2021 10:55 AM   MICROALBUR <0.7 03/26/2020 02:15 PM    Last diabetic Eye exam:  Lab Results  Component Value Date/Time   HMDIABEYEEXA No Retinopathy 09/30/2021 10:55 AM    Last diabetic Foot exam: No results found for: "HMDIABFOOTEX"   Lab Results  Component Value Date   CHOL 148 04/05/2021   HDL 61.90 04/05/2021   LDLCALC 74 04/05/2021   TRIG 62.0 04/05/2021   CHOLHDL 2 04/05/2021       Latest Ref Rng & Units 08/25/2021    7:40 AM 01/05/2021    8:17 AM 09/10/2020   12:03 PM  Hepatic Function  Total Protein 6.5 - 8.1 g/dL 6.9  7.3  6.7   Albumin 3.5 - 5.0 g/dL 3.4  3.4  3.7   AST 15 - 41 U/L '31  26  19   ' ALT 0 - 44 U/L '10  12  12   ' Alk Phosphatase 38 - 126 U/L 75  84  83   Total Bilirubin 0.3 - 1.2 mg/dL 0.9  1.0  0.4     Lab Results  Component Value Date/Time   TSH 1.59 04/05/2021 01:20 PM  TSH 2.80 02/26/2017 08:38 AM       Latest Ref Rng & Units 08/25/2021    7:40 AM 01/06/2021    3:11 PM 01/05/2021    8:17 AM  CBC  WBC 4.0 - 10.5 K/uL 5.6  7.9  7.9   Hemoglobin 12.0 - 15.0 g/dL 11.9  11.2  11.4   Hematocrit 36.0 - 46.0 % 38.3  35.8  35.6   Platelets 150 - 400 K/uL 209  218  213     Lab Results  Component Value Date/Time   VD25OH 83.12 04/05/2021 09:54 AM    Clinical ASCVD: No  The 10-year ASCVD risk score (Arnett DK, et al., 2019) is: 30.1%   Values used to calculate the score:     Age: 11 years     Sex: Female     Is Non-Hispanic African American: Yes     Diabetic: Yes     Tobacco smoker: No     Systolic Blood Pressure: 270 mmHg     Is BP treated: Yes     HDL  Cholesterol: 61.9 mg/dL     Total Cholesterol: 148 mg/dL       10/15/2021   10:24 AM 08/29/2021    9:26 AM 08/01/2021    4:27 PM  Depression screen PHQ 2/9  Decreased Interest 0 0 1  Down, Depressed, Hopeless 0 0 0  PHQ - 2 Score 0 0 1  Altered sleeping   2  Tired, decreased energy   0  Change in appetite   0  Feeling bad or failure about yourself    0  Trouble concentrating   0  Moving slowly or fidgety/restless   0  Suicidal thoughts   0  PHQ-9 Score   3  Difficult doing work/chores   Somewhat difficult    Social History   Tobacco Use  Smoking Status Former   Packs/day: 0.12   Years: 2.00   Total pack years: 0.24   Types: Cigarettes  Smokeless Tobacco Never  Tobacco Comments   08/03/2012 "quit 30 years ago"   BP Readings from Last 3 Encounters:  10/15/21 (!) 144/81  10/01/21 (!) 142/78  08/25/21 (!) 195/87   Pulse Readings from Last 3 Encounters:  10/15/21 83  10/01/21 76  08/25/21 67   Wt Readings from Last 3 Encounters:  10/15/21 281 lb (127.5 kg)  10/01/21 269 lb 6.4 oz (122.2 kg)  08/29/21 260 lb (117.9 kg)   BMI Readings from Last 3 Encounters:  10/15/21 56.76 kg/m  10/01/21 54.41 kg/m  08/29/21 52.51 kg/m    Assessment/Interventions: Review of patient past medical history, allergies, medications, health status, including review of consultants reports, laboratory and other test data, was performed as part of comprehensive evaluation and provision of chronic care management services.   SDOH:  (Social Determinants of Health) assessments and interventions performed: Yes SDOH Interventions    Flowsheet Row Chronic Care Management from 02/07/2021 in [facility] from 11/13/2020 in [facility] Visit from 04/09/2018 in [facility] Interventions -- Intervention Not Indicated --  Housing Interventions -- Intervention Not Indicated --   Transportation Interventions -- Intervention Not Indicated --  Depression Interventions/Treatment  -- -- Patient refuses Treatment  Financial Strain Interventions Intervention Not Indicated Intervention Not Indicated --  Physical Activity Interventions -- Intervention Not Indicated --  Stress Interventions -- Intervention Not Indicated --  Social Connections Interventions --  Intervention Not Indicated --       SDOH Screenings   Food Insecurity: No Food Insecurity (11/13/2020)  Housing: Low Risk  (11/13/2020)  Transportation Needs: No Transportation Needs (11/13/2020)  Alcohol Screen: Low Risk  (11/13/2020)  Depression (PHQ2-9): Low Risk  (10/15/2021)  Financial Resource Strain: Low Risk  (02/08/2021)  Physical Activity: Inactive (11/13/2020)  Social Connections: Moderately Integrated (11/13/2020)  Stress: No Stress Concern Present (11/13/2020)  Tobacco Use: Medium Risk (10/15/2021)    CCM Care Plan  Allergies  Allergen Reactions   Cymbalta [Duloxetine Hcl] Anaphylaxis   Pineapple Shortness Of Breath   Sulfa Antibiotics Hives, Shortness Of Breath and Itching    REACTION: unknown   Influenza Vaccines Hives and Itching   Penicillins Hives   Theophyllines Hives    Medications Reviewed Today     Reviewed by Bayard Hugger, NP (Nurse Practitioner) on 10/15/21 at 2015  Med List Status: <None>   Medication Order Taking? Sig Documenting Provider Last Dose Status Informant  albuterol (VENTOLIN HFA) 108 (90 Base) MCG/ACT inhaler [id number] Yes INHALE 1 PUFF INTO THE LUNGS EVERY 6 (SIX) HOURS AS NEEDED FOR SHORTNESS OF BREATH OR WHEEZING. Flossie Buffy, NP Taking Active   allopurinol (ZYLOPRIM) 100 MG tablet [id number] Yes Take 1 tablet (100 mg total) by mouth daily. Flossie Buffy, NP Taking Active   aspirin EC 81 MG tablet [id number] Yes Take 81 mg by mouth daily. [provider] Taking Active Self  atorvastatin (LIPITOR) 10 MG tablet [id number] Yes TAKE 1 TABLET BY MOUTH   DAILY AT 6 PM. Nche, Charlene Brooke, NP Taking Active   Carboxymethylcellulose Sodium (EYE DROPS OP) [id number] Yes Apply to eye. Lubricant eye drops [provider] Taking Active Self  cetirizine (ZYRTEC) 10 MG tablet [id number] Yes Take 10 mg by mouth daily as needed. [provider] Taking Active Self  cholecalciferol (VITAMIN D3) 25 MCG (1000 UNIT) tablet [id number] Yes Take 1,000 Units by mouth daily. [provider] Taking Active Self  dextromethorphan-guaiFENesin (MUCINEX DM) 30-600 MG 12hr tablet [id number] Yes Take 1 tablet by mouth 2 (two) times daily. [provider] Taking Active   FLUoxetine (PROZAC) 10 MG capsule [id number] No Take 1 capsule (10 mg total) by mouth daily.  Patient not taking: Reported on 10/01/2021   Nche, Charlene Brooke, NP Not Taking Active   lisinopril (ZESTRIL) 20 MG tablet [id number] Yes Take 1 tablet (20 mg total) by mouth daily. Flossie Buffy, NP Taking Active   Multiple Vitamin (MULTIVITAMIN) tablet [id number] Yes Take 1 tablet by mouth daily. [provider] Taking Active Self  omeprazole (PRILOSEC) 20 MG capsule [id number] Yes TAKE 1 CAPSULE BY MOUTH EVERY DAY Nche, Charlene Brooke, NP Taking Active   pregabalin (LYRICA) 150 MG capsule [id number] Yes Take 1 capsule (150 mg total) by mouth 2 (two) times daily. Kirsteins, Luanna Salk, MD Taking Active   Rimegepant Sulfate (NURTEC) 75 MG TBDP [id number] Yes Take 75 mg by mouth as needed (take 1 at onset of headache, max is 75 in 24 hours). Ward Givens, NP Taking Active   Semaglutide,0.25 or 0.5MG/DOS, (OZEMPIC, 0.25 OR 0.5 MG/DOSE,) 2 MG/1.5ML SOPN [id number] Yes Inject 0.5 mg into the skin once a week. Flossie Buffy, NP Taking Active   Sennosides 8.6 MG CAPS [id number] Yes Take 1 capsule by mouth daily. [provider] Taking Active Self           Med Note Germaine Pomfret   Thu Feb 07, 2021  4:04 PM)  SYMBICORT 160-4.5 MCG/ACT inhaler [id number] Yes Inhale 2  puffs into the lungs in the morning and at bedtime. Rinse mouth after each use Nche, Charlene Brooke, NP Taking Active   traMADol (ULTRAM) 50 MG tablet [id number] Yes Take 1 tablet (50 mg total) by mouth 3 (three) times daily. Kirsteins, Luanna Salk, MD Taking Active            Med Note Hazle Quant Oct 15, 2021 10:25 AM) Didn't bring bottle, LD 10/14/21            Patient Active Problem List   Diagnosis Date Noted   Chronic pain syndrome 05/31/2021   Lumbar radiculitis 04/18/2021   CKD (chronic kidney disease) stage 3, GFR 30-59 ml/min ([place]) 08/20/2020   Morbid obesity ([place]) 08/20/2020   Knee pain 08/20/2020   Intertrigo 03/27/2020   Normocytic anemia 08/23/2019   Polyethylene liner wear following total hip arthroplasty requiring isolated polyethylene liner exchange ([place]) 07/12/2019   Depression, recurrent ([place]) 07/12/2018   Migraine without aura and without status migrainosus, not intractable 09/10/2017   Post-traumatic osteoarthritis of right ankle 06/09/2016   Spondylosis without myelopathy or radiculopathy, lumbar region 06/09/2016   Cervical spinal stenosis 06/09/2016   Spinal stenosis, lumbar region, without neurogenic claudication 06/09/2016   Chronic gouty arthritis 06/04/2016   Vitamin D deficiency 06/02/2016   Hyperlipidemia 06/02/2016   Closed right ankle fracture 05/01/2016   Colon polyps 05/01/2016   Fibromyalgia syndrome 05/01/2016   Syncope 03/30/2016   Fall 03/30/2016   GERD with esophagitis 06/21/2015   Pharyngeal dysphagia 06/21/2015   Obesity 05/06/2013   Osteoarthritis of left hip 08/05/2012   Type 2 diabetes mellitus with diabetic polyneuropathy, without long-term current use of insulin ([place]) 08/05/2012   Hypertension 08/05/2012   Asthma, chronic 08/05/2012   Sleep apnea 08/05/2012    Immunization History  Administered Date(s) Administered   Moderna Sars-Covid-2 Vaccination 05/09/2019, 06/10/2019, 09/05/2020   Pneumococcal Conjugate-13  06/02/2016   Pneumococcal Polysaccharide-23 08/31/2017    Conditions to be addressed/monitored:  Hypertension, Hyperlipidemia, Diabetes, GERD, Asthma, Chronic Kidney Disease, Osteoarthritis, Gout, and Migraines  Care Plan : General Pharmacy (Adult)  Updates made by Germaine Pomfret, RPH since 11/21/2021 12:00 AM     Problem: Hypertension, Hyperlipidemia, Diabetes, GERD, Asthma, Chronic Kidney Disease, Osteoarthritis, Gout, and Migraines   Priority: High     Long-Range Goal: Patient-Specific Goal   Start Date: 02/28/2021  Expected End Date: 11/22/2022  This Visit's Progress: On track  Recent Progress: On track  Priority: High  Note:   Current Barriers:  No barriers noted  Pharmacist Clinical Goal(s):  Patient will maintain control of blood pressure as evidenced by BP less than 140/90  through collaboration with PharmD and provider.   Interventions: 1:1 collaboration with Nche, Charlene Brooke, NP regarding development and update of comprehensive plan of care as evidenced by provider attestation and co-signature Inter-disciplinary care team collaboration (see longitudinal plan of care) Comprehensive medication review performed; medication list updated in electronic medical record  Hypertension (BP goal <140/90) -Controlled -Current treatment: Lisinopril 20 mg daily -Medications previously tried: NA  -Current home readings: Not monitoring regularly -Denies hypotensive/hypertensive symptoms  -Recommended to continue current medication  Hyperlipidemia: (LDL goal < 100) -Controlled -Current treatment: Atorvastatin 10 mg daily  -Medications previously tried: NA -Recommended to continue current medication  Diabetes (A1c goal <7%) -Controlled -Current medications: Ozempic 0.5 mg weekly -Medications previously tried: Metformin  -Reports some appetite reduction since starting Ozempic. Reports 20+ lbs. weight loss. Wants to  start water aerobics but lack of transportation is a  limiting factor. She plans to reach out to her insurance to discuss transportation options.  -Recommended to continue current medication  Depression/Anxiety (Goal: Achieve symptom remission) -Controlled -Current treatment: None -Medications previously tried/failed: Escitalopram -Stopped fluoxetine approximately 3 months ago, felt like it made her depression worse. Took the medication for two weeks. Felt more fatigue and anhedonia symptoms.  -Recommended to continue current medication  Asthma (Goal: control symptoms and prevent exacerbations) -Controlled -Current treatment  Albuterol 1-2 puffs every 6 hours as needed Symbicort 2 puffs twice daily  -Medications previously tried: NA  -Exacerbations requiring treatment in last 6 months: None -Patient reports consistent use of maintenance inhaler -Frequency of rescue inhaler use: 2-3 times weekly, mostly when more active.  -Recommended to continue current medication  GERD (Goal: Prevent Heartburn/Reflux) -Controlled -Current treatment  Omeprazole 20 mg daily  -Medications previously tried: NA -Counseled on trigger avoidance.   -Recommended to continue current medication  Gout (Goal: Prevent gout flares) -Controlled -Last Gout Flare: 1 Year ago, both feet and legs  -Current treatment  Allopurinol 100 mg daily  -Medications previously tried: NA  -We discussed:  Counseled patient on low purine diet plan. Counseled patient to reduce consumption of high-fructose corn syrup, sweetened soft drinks, fruit juices, meat, and seafood. -Recommended to continue current medication  Chronic Pain (Goal: Minimize pain symptoms) -Controlled -Current treatment  Pregabalin 150 mg twice daily  Tramadol 50 mg three times daily  -Medications previously tried: NA  -Pain 8 or 9 without medicine. 5 or 6.  -Recommended to continue current medication  Migraines (Goal: Prevent migraines) -Controlled -Last month has had 3 migraines requiring  treatment.  -Current treatment  Rimepgepant 75 mg once daily as needed for headaches  -Medications previously tried: NA  -Reports symptoms requiring treatment ~1x monthly.  -Recommended to continue current medication  Chronic Kidney Disease Stage 3b  -All medications assessed for renal dosing and appropriateness in chronic kidney disease. -Recommended to continue current medication  Patient Goals/Self-Care Activities Patient will:  - check blood pressure weekly, document, and provide at future appointments  Follow Up Plan: Telephone follow up appointment with care management team member scheduled for:  05/22/22 at 3:00 PM    Medication Assistance: None required.  Patient affirms current coverage meets needs.  Compliance/Adherence/Medication fill history: Care Gaps: Shingrix Vaccine Ophthalmology Exam (Last Completed 05/12/2018) Foot Exam (Last Completed 08/23/2019) COVID-19 Vaccine (4- Booster for Moderna series)  Star-Rating Drugs: Lisinopril 20 mg last filled 10/03/2020 [facility] day supply at [facility]. Atorvastatin 10 mg last filled 11/11/2020 90 day supply at [facility].   Patient's preferred pharmacy is:  [id number] [place], [id number]: [phone]: [phone] Uses pill box? Yes Pt endorses 100% compliance  We discussed: Current pharmacy is preferred with insurance plan and patient is satisfied with pharmacy services Patient decided to: Continue current medication management strategy  Care Plan and Follow Up Patient Decision:  Patient agrees to Care Plan and Follow-up.  Plan: Telephone follow up appointment with care management team member scheduled for:  05/22/22 at 3:00 PM  AJunius Argyle PharmD, BPara March CPP Clinical Pharmacist Practitioner  [facility] [phone]

## 2021-11-21 NOTE — Patient Instructions (Signed)
Visit Information It was great speaking with you today!  Please let me know if you have any questions about our visit.   Goals Addressed             This Visit's Progress    Track and Manage My Blood Pressure-Hypertension   On track    Timeframe:  Long-Range Goal Priority:  High Start Date:  02/08/21                           Expected End Date: 02/08/22                      Follow Up within 90 days   - check blood pressure weekly - write blood pressure results in a log or diary    Why is this important?   You won't feel high blood pressure, but it can still hurt your blood vessels.  High blood pressure can cause heart or kidney problems. It can also cause a stroke.  Making lifestyle changes like losing a little weight or eating less salt will help.  Checking your blood pressure at home and at different times of the day can help to control blood pressure.  If the doctor prescribes medicine remember to take it the way the doctor ordered.  Call the office if you cannot afford the medicine or if there are questions about it.     Notes:         Patient Care Plan: General Pharmacy (Adult)     Problem Identified: Hypertension, Hyperlipidemia, Diabetes, GERD, Asthma, Chronic Kidney Disease, Osteoarthritis, Gout, and Migraines   Priority: High     Long-Range Goal: Patient-Specific Goal   Start Date: 02/28/2021  Expected End Date: 11/22/2022  This Visit's Progress: On track  Recent Progress: On track  Priority: High  Note:   Current Barriers:  No barriers noted  Pharmacist Clinical Goal(s):  Patient will maintain control of blood pressure as evidenced by BP less than 140/90  through collaboration with PharmD and provider.   Interventions: 1:1 collaboration with Nche, Charlene Brooke, NP regarding development and update of comprehensive plan of care as evidenced by provider attestation and co-signature Inter-disciplinary care team collaboration (see longitudinal plan of  care) Comprehensive medication review performed; medication list updated in electronic medical record  Hypertension (BP goal <140/90) -Controlled -Current treatment: Lisinopril 20 mg daily -Medications previously tried: NA  -Current home readings: Not monitoring regularly -Denies hypotensive/hypertensive symptoms  -Recommended to continue current medication  Hyperlipidemia: (LDL goal < 100) -Controlled -Current treatment: Atorvastatin 10 mg daily  -Medications previously tried: NA -Recommended to continue current medication  Diabetes (A1c goal <7%) -Controlled -Current medications: Ozempic 0.5 mg weekly -Medications previously tried: Metformin  -Reports some appetite reduction since starting Ozempic. Reports 20+ lbs. weight loss. Wants to start water aerobics but lack of transportation is a limiting factor. She plans to reach out to her insurance to discuss transportation options.  -Recommended to continue current medication  Depression/Anxiety (Goal: Achieve symptom remission) -Controlled -Current treatment: None -Medications previously tried/failed: Escitalopram -Stopped fluoxetine approximately 3 months ago, felt like it made her depression worse. Took the medication for two weeks. Felt more fatigue and anhedonia symptoms.  -Recommended to continue current medication  Asthma (Goal: control symptoms and prevent exacerbations) -Controlled -Current treatment  Albuterol 1-2 puffs every 6 hours as needed Symbicort 2 puffs twice daily  -Medications previously tried: NA  -Exacerbations requiring treatment in last 6  months: None -Patient reports consistent use of maintenance inhaler -Frequency of rescue inhaler use: 2-3 times weekly, mostly when more active.  -Recommended to continue current medication  GERD (Goal: Prevent Heartburn/Reflux) -Controlled -Current treatment  Omeprazole 20 mg daily  -Medications previously tried: NA -Counseled on trigger avoidance.    -Recommended to continue current medication  Gout (Goal: Prevent gout flares) -Controlled -Last Gout Flare: 1 Year ago, both feet and legs  -Current treatment  Allopurinol 100 mg daily  -Medications previously tried: NA  -We discussed:  Counseled patient on low purine diet plan. Counseled patient to reduce consumption of high-fructose corn syrup, sweetened soft drinks, fruit juices, meat, and seafood. -Recommended to continue current medication  Chronic Pain (Goal: Minimize pain symptoms) -Controlled -Current treatment  Pregabalin 150 mg twice daily  Tramadol 50 mg three times daily  -Medications previously tried: NA  -Pain 8 or 9 without medicine. 5 or 6.  -Recommended to continue current medication  Migraines (Goal: Prevent migraines) -Controlled -Last month has had 3 migraines requiring treatment.  -Current treatment  Rimepgepant 75 mg once daily as needed for headaches  -Medications previously tried: NA  -Reports symptoms requiring treatment ~1x monthly.  -Recommended to continue current medication  Chronic Kidney Disease Stage 3b  -All medications assessed for renal dosing and appropriateness in chronic kidney disease. -Recommended to continue current medication  Patient Goals/Self-Care Activities Patient will:  - check blood pressure weekly, document, and provide at future appointments  Follow Up Plan: Telephone follow up appointment with care management team member scheduled for:  05/22/22 at 3:00 PM    Patient agreed to services and verbal consent obtained.   The patient verbalized understanding of instructions, educational materials, and care plan provided today and DECLINED offer to receive copy of patient instructions, educational materials, and care plan.   Junius Argyle, PharmD, Para March, CPP Clinical Pharmacist Practitioner  [facility]  [phone]

## 2021-11-22 ENCOUNTER — Other Ambulatory Visit: Payer: Self-pay | Admitting: Nurse Practitioner

## 2021-11-22 DIAGNOSIS — F339 Major depressive disorder, recurrent, unspecified: Secondary | ICD-10-CM

## 2021-11-22 NOTE — Assessment & Plan Note (Signed)
She stopped fluoxetine, states it made depressive symptoms worse

## 2021-11-26 ENCOUNTER — Other Ambulatory Visit: Payer: Self-pay | Admitting: Physician Assistant

## 2021-11-26 ENCOUNTER — Ambulatory Visit (INDEPENDENT_AMBULATORY_CARE_PROVIDER_SITE_OTHER): Payer: Medicare Other

## 2021-11-26 VITALS — Ht 60.0 in | Wt 175.0 lb

## 2021-11-26 DIAGNOSIS — Z0000 Encounter for general adult medical examination without abnormal findings: Secondary | ICD-10-CM

## 2021-11-26 DIAGNOSIS — K5900 Constipation, unspecified: Secondary | ICD-10-CM | POA: Diagnosis not present

## 2021-11-26 DIAGNOSIS — R1310 Dysphagia, unspecified: Secondary | ICD-10-CM | POA: Diagnosis not present

## 2021-11-26 DIAGNOSIS — K219 Gastro-esophageal reflux disease without esophagitis: Secondary | ICD-10-CM | POA: Diagnosis not present

## 2021-11-26 NOTE — Progress Notes (Signed)
Subjective:   Sandra Brennan is a 76 y.o. female who presents for Medicare Annual (Subsequent) preventive examination.   Virtual Visit via Telephone Note  I connected with  Sandra Brennan on 11/26/21 at  1:30 PM EDT by telephone and verified that I am speaking with the correct person using two identifiers.  Location: Patient: home  Provider: [place]  Persons participating in the virtual visit: patient/Nurse Health Advisor   I discussed the limitations, risks, security and privacy concerns of performing an evaluation and management service by telephone and the availability of in person appointments. The patient expressed understanding and agreed to proceed.  Interactive audio and video telecommunications were attempted between this nurse and patient, however failed, due to patient having technical difficulties OR patient did not have access to video capability.  We continued and completed visit with audio only.  Some vital signs may be absent or patient reported.   Daphane Shepherd, LPN  Review of Systems     Cardiac Risk Factors include: advanced age (>61mn, >>68women);diabetes mellitus;hypertension     Objective:    Today's Vitals   11/26/21 1345  Weight: 175 lb (79.4 kg)  Height: 5' (1.524 m)   Body mass index is 34.18 kg/m.     11/26/2021    1:52 PM 10/15/2021   10:24 AM 08/29/2021    9:27 AM 08/25/2021    7:52 AM 01/06/2021   12:22 PM 01/05/2021    9:54 AM 11/13/2020   11:38 AM  Advanced Directives  Does Patient Have a Medical Advance Directive? Yes Yes  No No No Yes  Type of [place]     [place] Chart? No - copy requested      Yes - validated most recent copy scanned in chart (See row information)  Would patient like information on creating a medical advance directive?   Yes (ED - Information included in AVS) No -  Patient declined  No - Patient declined No - Patient declined    Current Medications (verified) Outpatient Encounter Medications as of 11/26/2021  Medication Sig   albuterol (VENTOLIN HFA) 108 (90 Base) MCG/ACT inhaler INHALE 1 PUFF INTO THE LUNGS EVERY 6 (SIX) HOURS AS NEEDED FOR SHORTNESS OF BREATH OR WHEEZING.   allopurinol (ZYLOPRIM) 100 MG tablet Take 1 tablet (100 mg total) by mouth daily.   aspirin EC 81 MG tablet Take 81 mg by mouth daily.   atorvastatin (LIPITOR) 10 MG tablet TAKE 1 TABLET BY MOUTH  DAILY AT 6 PM.   Carboxymethylcellulose Sodium (EYE DROPS OP) Apply to eye. Lubricant eye drops   cetirizine (ZYRTEC) 10 MG tablet Take 10 mg by mouth daily as needed.   cholecalciferol (VITAMIN D3) 25 MCG (1000 UNIT) tablet Take 1,000 Units by mouth daily.   dextromethorphan-guaiFENesin (MUCINEX DM) 30-600 MG 12hr tablet Take 1 tablet by mouth 2 (two) times daily.   lisinopril (ZESTRIL) 20 MG tablet Take 1 tablet (20 mg total) by mouth daily.   Multiple Vitamin (MULTIVITAMIN) tablet Take 1 tablet by mouth daily.   omeprazole (PRILOSEC) 20 MG capsule TAKE 1 CAPSULE BY MOUTH EVERY DAY   pregabalin (LYRICA) 150 MG capsule Take 1 capsule (150 mg total) by mouth 2 (two) times daily.   Rimegepant Sulfate (NURTEC) 75 MG TBDP Take 75 mg by mouth as needed (take 1 at onset of headache, max is 75 in  24 hours).   Semaglutide,0.25 or 0.'5MG'$ /DOS, (OZEMPIC, 0.25 OR 0.5 MG/DOSE,) 2 MG/1.5ML SOPN Inject 0.5 mg into the skin once a week.   Sennosides 8.6 MG CAPS Take 1 capsule by mouth daily.   SYMBICORT 160-4.5 MCG/ACT inhaler Inhale 2 puffs into the lungs in the morning and at bedtime. Rinse mouth after each use   traMADol (ULTRAM) 50 MG tablet Take 1 tablet (50 mg total) by mouth 3 (three) times daily.   No facility-administered encounter medications on file as of 11/26/2021.    Allergies (verified) Cymbalta [duloxetine hcl], Pineapple, Sulfa antibiotics, Influenza vaccines, Penicillins, and  Theophyllines   History: Past Medical History:  Diagnosis Date   Acute nontraumatic kidney injury ([place]) 05/05/2013   Acute posthemorrhagic anemia 09/06/2012   Anemia    Arthritis    "plenty" (August 07, 2012)   Asthma    Chest pain 03/30/2016   Chronic lower back pain    "they say I need a whole new spinal column" (August 07, 2012)   COPD (chronic obstructive pulmonary disease) (HCC)    Degenerative arthritis    "all over" (Aug 07, 2012)   Fx ankle    GERD (gastroesophageal reflux disease)    Gout 05/05/2013   Hypertension    Migraines    "used to; totally stopped when I quit drinking 30 yr ago" (August 07, 2012)   Myalgia and myositis    Myocardial infarction [facility]) 1992   August 07, 2012 "mild MI when son died "   Obesity    Osteoporosis    "all over" (07-Aug-2012)   Shortness of breath    when stressed.   Sleep apnea    "never did fix my machine; haven't used one for 5 years; I've lost 116# since then; no problems now" (2012-08-07)   Type II diabetes mellitus ([place])    "borderline; don't test; take Metformin" (2012-08-07)   Past Surgical History:  Procedure Laterality Date   ABDOMINAL HYSTERECTOMY  1990's   GANGLION CYST EXCISION Right 1980's   "wrist" (Aug 07, 2012)   JOINT REPLACEMENT     KNEE LIGAMENT RECONSTRUCTION Right 1980's   TONSILLECTOMY  ~ [place] Left 2012-08-07   TOTAL HIP ARTHROPLASTY Left 2012/08/07   Procedure: TOTAL HIP ARTHROPLASTY;  Surgeon: Garald Balding, MD;  Location: [place];  Service: Orthopedics;  Laterality: Left;  Left Total Hip Arthroplasty   TOTAL HIP ARTHROPLASTY Left 08/28/2012   Procedure: Irrigation and Debridement hip ;  Surgeon: Mcarthur Rossetti, MD;  Location: [place];  Service: Orthopedics;  Laterality: Left;   TOTAL KNEE ARTHROPLASTY Left 2001   TOTAL KNEE ARTHROPLASTY Right 2004   Family History  Problem Relation Age of Onset   Arthritis Mother    Arthritis Father    Colon cancer Father    Diabetes Sister    Arthritis Sister    Diabetes Maternal  Grandmother    Arthritis Maternal Grandmother    Diabetes Maternal Grandfather    Arthritis Maternal Grandfather    Migraines Neg Hx    Sleep apnea Neg Hx    Social History   Socioeconomic History   Marital status: Widowed    Spouse name: Not on file   Number of children: 5   Years of education: PhD   Highest education level: Not on file  Occupational History   Occupation: Retired  Tobacco Use   Smoking status: Former    Packs/day: 0.12    Years: 2.00    Total pack years: 0.24    Types: Cigarettes   Smokeless tobacco:  Never   Tobacco comments:    08/03/2012 "quit 30 years ago"  Vaping Use   Vaping Use: Never used  Substance and Sexual Activity   Alcohol use: No    Alcohol/week: 0.0 standard drinks of alcohol    Comment: 08/03/2012 "used to be a beeralcoholic"; stopped ~ 30 yr ago"   Drug use: No   Sexual activity: Never  Other Topics Concern   Not on file  Social History Narrative   Lives at home with her mother and caregiver.- mother past away 01/2021 and now lives alone.   Occasional use of caffeine.   Right-handed.   Social Determinants of Health   Financial Resource Strain: Low Risk  (11/26/2021)   Overall Financial Resource Strain (CARDIA)    Difficulty of Paying Living Expenses: Not hard at all  Food Insecurity: No Food Insecurity (11/26/2021)   Hunger Vital Sign    Worried About Running Out of Food in the Last Year: Never true    Ran Out of Food in the Last Year: Never true  Transportation Needs: No Transportation Needs (11/26/2021)   PRAPARE - Hydrologist (Medical): No    Lack of Transportation (Non-Medical): No  Physical Activity: Insufficiently Active (11/26/2021)   Exercise Vital Sign    Days of Exercise per Week: 3 days    Minutes of Exercise per Session: 30 min  Stress: No Stress Concern Present (11/26/2021)   [place]    Feeling of Stress : Not at all   Social Connections: Moderately Isolated (11/26/2021)   Social Connection and Isolation Panel [NHANES]    Frequency of Communication with Friends and Family: More than three times a week    Frequency of Social Gatherings with Friends and Family: More than three times a week    Attends Religious Services: More than 4 times per year    Active Member of [employer] or Organizations: No    Attends Archivist Meetings: Never    Marital Status: Widowed    Tobacco Counseling Counseling given: Not Answered Tobacco comments: 08/03/2012 "quit 30 years ago"   Clinical Intake:  Pre-visit preparation completed: Yes  Pain : No/denies pain     Nutritional Risks: None Diabetes: No  How often do you need to have someone help you when you read instructions, pamphlets, or other written materials from your doctor or pharmacy?: 1 - Never  Diabetic?yes  Nutrition Risk Assessment:  Has the patient had any N/V/D within the last 2 months?  No  Does the patient have any non-healing wounds?  No  Has the patient had any unintentional weight loss or weight gain?  No   Diabetes:  Is the patient diabetic?  Yes  If diabetic, was a CBG obtained today?  No  Did the patient bring in their glucometer from home?  No  How often do you monitor your CBG's? Never .   Financial Strains and Diabetes Management:  Are you having any financial strains with the device, your supplies or your medication? No .  Does the patient want to be seen by Chronic Care Management for management of their diabetes?  No  Would the patient like to be referred to a Nutritionist or for Diabetic Management?  No   Diabetic Exams:  Diabetic Eye Exam: Completed 08/2021 Diabetic Foot Exam: Overdue, Pt has been advised about the importance in completing this exam. Pt is scheduled for diabetic foot exam on next office  visit .   Interpreter Needed?: No  Information entered by :: Jadene Pierini, LPN   Activities of Daily Living     11/26/2021    1:52 PM  In your present state of health, do you have any difficulty performing the following activities:  Hearing? 0  Vision? 0  Difficulty concentrating or making decisions? 0  Walking or climbing stairs? 0  Dressing or bathing? 0  Doing errands, shopping? 0  Preparing Food and eating ? N  Using the Toilet? N  In the past six months, have you accidently leaked urine? N  Do you have problems with loss of bowel control? N  Managing your Medications? N  Managing your Finances? N  Housekeeping or managing your Housekeeping? N    Patient Care Team: Nche, Charlene Brooke, NP as PCP - General (Internal Medicine) Garald Balding, MD as Consulting Physician (Orthopedic Surgery) Germaine Pomfret, [facility] as Pharmacist (Pharmacist)  Indicate any recent Medical Services you may have received from other than [facility] providers in the past year (date may be approximate).     Assessment:   This is a routine wellness examination for [place].  Hearing/Vision screen Vision Screening - Comments:: Annual eye exam wear glasses   Dietary issues and exercise activities discussed: Current Exercise Habits: Home exercise routine, Type of exercise: walking, Time (Minutes): 30, Frequency (Times/Week): 3, Weekly Exercise (Minutes/Week): 90, Intensity: Mild, Exercise limited by: None identified   Goals Addressed             This Visit's Progress    Eat healthier   On track      Depression Screen    11/26/2021    1:51 PM 10/15/2021   10:24 AM 08/29/2021    9:26 AM 08/01/2021    4:27 PM 07/01/2021    8:55 AM 06/03/2021   10:39 AM 04/18/2021   11:25 AM  PHQ 2/9 Scores  PHQ - 2 Score 0 0 0 1 0 0 2  PHQ- 9 Score    '3 7 4     '$ Fall Risk    11/26/2021    1:48 PM 10/15/2021   10:24 AM 08/29/2021    9:26 AM 06/03/2021   10:07 AM 04/18/2021   11:24 AM  [place] in the past year? 0 0 0 0 0  Number falls in past yr: 0   0 1  Injury with Fall? 0   0 0  Risk for fall due to : No Fall  Risks   History of fall(s)   Follow up Falls prevention discussed   Falls evaluation completed     FALL RISK PREVENTION PERTAINING TO THE HOME:  Any stairs in or around the home? No  If so, are there any without handrails? No  Home free of loose throw rugs in walkways, pet beds, electrical cords, etc? Yes  Adequate lighting in your home to reduce risk of falls? Yes   ASSISTIVE DEVICES UTILIZED TO PREVENT FALLS:  Life alert? No  Use of a cane, walker or w/c? Yes  Grab bars in the bathroom? Yes  Shower chair or bench in shower? Yes  Elevated toilet seat or a handicapped toilet? Yes       04/01/2017   10:40 AM  MMSE - Mini Mental State Exam  Orientation to time 5  Orientation to Place 5  Registration 3  Attention/ Calculation 5  Recall 1  Language- name 2 objects 2  Language- repeat 1  Language- follow 3  step command 3  Language- read & follow direction 1  Write a sentence 1  Copy design 1  Total score 28        11/26/2021    1:53 PM  6CIT Screen  What Year? 0 points  What month? 0 points  What time? 0 points  Count back from 20 0 points  Months in reverse 0 points  Repeat phrase 2 points  Total Score 2 points    Immunizations Immunization History  Administered Date(s) Administered   Moderna Sars-Covid-2 Vaccination 05/09/2019, 06/10/2019, 09/05/2020   Pneumococcal Conjugate-13 06/02/2016   Pneumococcal Polysaccharide-23 08/31/2017    TDAP status: Due, Education has been provided regarding the importance of this vaccine. Advised may receive this vaccine at local pharmacy or Health Dept. Aware to provide a copy of the vaccination record if obtained from local pharmacy or Health Dept. Verbalized acceptance and understanding.  Flu Vaccine status: Due, Education has been provided regarding the importance of this vaccine. Advised may receive this vaccine at local pharmacy or Health Dept. Aware to provide a copy of the vaccination record if obtained from local  pharmacy or Health Dept. Verbalized acceptance and understanding.  Pneumococcal vaccine status: Up to date  Covid-19 vaccine status: Information provided on how to obtain vaccines.   Qualifies for Shingles Vaccine? Yes   Zostavax completed No   Shingrix Completed?: No.    Education has been provided regarding the importance of this vaccine. Patient has been advised to call insurance company to determine out of pocket expense if they have not yet received this vaccine. Advised may also receive vaccine at local pharmacy or Health Dept. Verbalized acceptance and understanding.  Screening Tests Health Maintenance  Topic Date Due   TETANUS/TDAP  Never done   COVID-19 Vaccine (4 - Moderna risk series) 10/31/2020   Zoster Vaccines- Shingrix (1 of 2) 01/01/2022 (Originally 06/07/1964)   COLONOSCOPY (Pts 45-67yr Insurance coverage will need to be confirmed)  01/01/2022   MAMMOGRAM  03/29/2022   HEMOGLOBIN A1C  04/03/2022   Diabetic kidney evaluation - Urine ACR  06/04/2022   FOOT EXAM  07/02/2022   Diabetic kidney evaluation - GFR measurement  08/26/2022   OPHTHALMOLOGY EXAM  10/01/2022   Pneumonia Vaccine 76 Years old  Completed   DEXA SCAN  Completed   Hepatitis C Screening  Completed   HPV VACCINES  Aged Out    Health Maintenance  Health Maintenance Due  Topic Date Due   TETANUS/TDAP  Never done   COVID-19 Vaccine (4 - Moderna risk series) 10/31/2020    Colorectal cancer screening: Type of screening: Colonoscopy. Completed 01/02/2012. Repeat every 10 years  Mammogram status: Completed 03/29/2021. Repeat every year  Bone Density status: Completed 03/18/2019. Results reflect: Bone density results: OSTEOPENIA. Repeat every 5 years.  Lung Cancer Screening: (Low Dose CT Chest recommended if Age 74-80years, 30 pack-year currently smoking OR have quit w/in 15years.) does not qualify.   Lung Cancer Screening Referral: n/a  Additional Screening:  Hepatitis C Screening: does not  qualify; Completed 06/02/2016  Vision Screening: Recommended annual ophthalmology exams for early detection of glaucoma and other disorders of the eye. Is the patient up to date with their annual eye exam?  Yes  Who is the provider or what is the name of the office in which the patient attends annual eye exams? [facility]  If pt is not established with a provider, would they like to be referred to a provider to establish care? No .  Dental Screening: Recommended annual dental exams for proper oral hygiene  Community Resource Referral / Chronic Care Management: CRR required this visit?  No   CCM required this visit?  No      Plan:     I have personally reviewed and noted the following in the patient's chart:   Medical and social history Use of alcohol, tobacco or illicit drugs  Current medications and supplements including opioid prescriptions. Patient is not currently taking opioid prescriptions. Functional ability and status Nutritional status Physical activity Advanced directives List of other physicians Hospitalizations, surgeries, and ER visits in previous 12 months Vitals Screenings to include cognitive, depression, and falls Referrals and appointments  In addition, I have reviewed and discussed with patient certain preventive protocols, quality metrics, and best practice recommendations. A written personalized care plan for preventive services as well as general preventive health recommendations were provided to patient.     Daphane Shepherd, LPN   8/88/2800   Nurse Notes: Due flu /TDAPvaccine

## 2021-11-26 NOTE — Patient Instructions (Signed)
Sandra Brennan , Thank you for taking time to come for your Medicare Wellness Visit. I appreciate your ongoing commitment to your health goals. Please review the following plan we discussed and let me know if I can assist you in the future.   These are the goals we discussed:  Goals      Eat healthier     Patient Stated     Would like to be able to walk better     Track and Manage My Blood Pressure-Hypertension     Timeframe:  Long-Range Goal Priority:  High Start Date:  02/08/21                           Expected End Date: 02/08/22                      Follow Up within 90 days   - check blood pressure weekly - write blood pressure results in a log or diary    Why is this important?   You won't feel high blood pressure, but it can still hurt your blood vessels.  High blood pressure can cause heart or kidney problems. It can also cause a stroke.  Making lifestyle changes like losing a little weight or eating less salt will help.  Checking your blood pressure at home and at different times of the day can help to control blood pressure.  If the doctor prescribes medicine remember to take it the way the doctor ordered.  Call the office if you cannot afford the medicine or if there are questions about it.     Notes:         This is a list of the screening recommended for you and due dates:  Health Maintenance  Topic Date Due   Tetanus Vaccine  Never done   COVID-19 Vaccine (4 - Moderna risk series) 10/31/2020   Zoster (Shingles) Vaccine (1 of 2) 01/01/2022*   Colon Cancer Screening  01/01/2022   Mammogram  03/29/2022   Hemoglobin A1C  04/03/2022   Yearly kidney health urinalysis for diabetes  06/04/2022   Complete foot exam   07/02/2022   Yearly kidney function blood test for diabetes  08/26/2022   Eye exam for diabetics  10/01/2022   Pneumonia Vaccine  Completed   DEXA scan (bone density measurement)  Completed   Hepatitis C Screening: USPSTF Recommendation to screen - Ages  25-79 yo.  Completed   HPV Vaccine  Aged Out  *Topic was postponed. The date shown is not the original due date.    Advanced directives: Please bring a copy of your health care power of attorney and living will to the office to be added to your chart at your convenience.   Conditions/risks identified: Aim for 30 minutes of exercise or brisk walking, 6-8 glasses of water, and 5 servings of fruits and vegetables each day.   Next appointment: Follow up in one year for your annual wellness visit    Preventive Care 65 Years and Older, Female Preventive care refers to lifestyle choices and visits with your health care provider that can promote health and wellness. What does preventive care include? A yearly physical exam. This is also called an annual well check. Dental exams once or twice a year. Routine eye exams. Ask your health care provider how often you should have your eyes checked. Personal lifestyle choices, including: Daily care of your teeth and gums. Regular physical  activity. Eating a healthy diet. Avoiding tobacco and drug use. Limiting alcohol use. Practicing safe sex. Taking low-dose aspirin every day. Taking vitamin and mineral supplements as recommended by your health care provider. What happens during an annual well check? The services and screenings done by your health care provider during your annual well check will depend on your age, overall health, lifestyle risk factors, and family history of disease. Counseling  Your health care provider may ask you questions about your: Alcohol use. Tobacco use. Drug use. Emotional well-being. Home and relationship well-being. Sexual activity. Eating habits. History of falls. Memory and ability to understand (cognition). Work and work Statistician. Reproductive health. Screening  You may have the following tests or measurements: Height, weight, and BMI. Blood pressure. Lipid and cholesterol levels. These may be  checked every 5 years, or more frequently if you are over 53 years old. Skin check. Lung cancer screening. You may have this screening every year starting at age 79 if you have a 30-pack-year history of smoking and currently smoke or have quit within the past 15 years. Fecal occult blood test (FOBT) of the stool. You may have this test every year starting at age 25. Flexible sigmoidoscopy or colonoscopy. You may have a sigmoidoscopy every 5 years or a colonoscopy every 10 years starting at age 67. Hepatitis C blood test. Hepatitis B blood test. Sexually transmitted disease (STD) testing. Diabetes screening. This is done by checking your blood sugar (glucose) after you have not eaten for a while (fasting). You may have this done every 1-3 years. Bone density scan. This is done to screen for osteoporosis. You may have this done starting at age 51. Mammogram. This may be done every 1-2 years. Talk to your health care provider about how often you should have regular mammograms. Talk with your health care provider about your test results, treatment options, and if necessary, the need for more tests. Vaccines  Your health care provider may recommend certain vaccines, such as: Influenza vaccine. This is recommended every year. Tetanus, diphtheria, and acellular pertussis (Tdap, Td) vaccine. You may need a Td booster every 10 years. Zoster vaccine. You may need this after age 80. Pneumococcal 13-valent conjugate (PCV13) vaccine. One dose is recommended after age 59. Pneumococcal polysaccharide (PPSV23) vaccine. One dose is recommended after age 90. Talk to your health care provider about which screenings and vaccines you need and how often you need them. This information is not intended to replace advice given to you by your health care provider. Make sure you discuss any questions you have with your health care provider. Document Released: 03/16/2015 Document Revised: 11/07/2015 Document Reviewed:  12/19/2014 Elsevier Interactive Patient Education  2017 [place] Prevention in the Home Falls can cause injuries. They can happen to people of all ages. There are many things you can do to make your home safe and to help prevent falls. What can I do on the outside of my home? Regularly fix the edges of walkways and driveways and fix any cracks. Remove anything that might make you trip as you walk through a door, such as a raised step or threshold. Trim any bushes or trees on the path to your home. Use bright outdoor lighting. Clear any walking paths of anything that might make someone trip, such as rocks or tools. Regularly check to see if handrails are loose or broken. Make sure that both sides of any steps have handrails. Any raised decks and porches should have guardrails on  the edges. Have any leaves, snow, or ice cleared regularly. Use sand or salt on walking paths during winter. Clean up any spills in your garage right away. This includes oil or grease spills. What can I do in the bathroom? Use night lights. Install grab bars by the toilet and in the tub and shower. Do not use towel bars as grab bars. Use non-skid mats or decals in the tub or shower. If you need to sit down in the shower, use a plastic, non-slip stool. Keep the floor dry. Clean up any water that spills on the floor as soon as it happens. Remove soap buildup in the tub or shower regularly. Attach bath mats securely with double-sided non-slip rug tape. Do not have throw rugs and other things on the floor that can make you trip. What can I do in the bedroom? Use night lights. Make sure that you have a light by your bed that is easy to reach. Do not use any sheets or blankets that are too big for your bed. They should not hang down onto the floor. Have a firm chair that has side arms. You can use this for support while you get dressed. Do not have throw rugs and other things on the floor that can make you  trip. What can I do in the kitchen? Clean up any spills right away. Avoid walking on wet floors. Keep items that you use a lot in easy-to-reach places. If you need to reach something above you, use a strong step stool that has a grab bar. Keep electrical cords out of the way. Do not use floor polish or wax that makes floors slippery. If you must use wax, use non-skid floor wax. Do not have throw rugs and other things on the floor that can make you trip. What can I do with my stairs? Do not leave any items on the stairs. Make sure that there are handrails on both sides of the stairs and use them. Fix handrails that are broken or loose. Make sure that handrails are as long as the stairways. Check any carpeting to make sure that it is firmly attached to the stairs. Fix any carpet that is loose or worn. Avoid having throw rugs at the top or bottom of the stairs. If you do have throw rugs, attach them to the floor with carpet tape. Make sure that you have a light switch at the top of the stairs and the bottom of the stairs. If you do not have them, ask someone to add them for you. What else can I do to help prevent falls? Wear shoes that: Do not have high heels. Have rubber bottoms. Are comfortable and fit you well. Are closed at the toe. Do not wear sandals. If you use a stepladder: Make sure that it is fully opened. Do not climb a closed stepladder. Make sure that both sides of the stepladder are locked into place. Ask someone to hold it for you, if possible. Clearly mark and make sure that you can see: Any grab bars or handrails. First and last steps. Where the edge of each step is. Use tools that help you move around (mobility aids) if they are needed. These include: Canes. Walkers. Scooters. Crutches. Turn on the lights when you go into a dark area. Replace any light bulbs as soon as they burn out. Set up your furniture so you have a clear path. Avoid moving your furniture  around. If any of your floors are  uneven, fix them. If there are any pets around you, be aware of where they are. Review your medicines with your doctor. Some medicines can make you feel dizzy. This can increase your chance of falling. Ask your doctor what other things that you can do to help prevent falls. This information is not intended to replace advice given to you by your health care provider. Make sure you discuss any questions you have with your health care provider. Document Released: 12/14/2008 Document Revised: 07/26/2015 Document Reviewed: 03/24/2014 Elsevier Interactive Patient Education  2017 [employer].

## 2021-11-28 ENCOUNTER — Ambulatory Visit
Admission: RE | Admit: 2021-11-28 | Discharge: 2021-11-28 | Disposition: A | Discharge status: Home / Self Care | Payer: Medicare Other | Source: Ambulatory Visit | Attending: Physician Assistant | Admitting: Physician Assistant

## 2021-11-28 DIAGNOSIS — K224 Dyskinesia of esophagus: Secondary | ICD-10-CM | POA: Diagnosis not present

## 2021-11-28 DIAGNOSIS — R1310 Dysphagia, unspecified: Secondary | ICD-10-CM

## 2021-11-28 DIAGNOSIS — K219 Gastro-esophageal reflux disease without esophagitis: Secondary | ICD-10-CM | POA: Diagnosis not present

## 2021-11-29 ENCOUNTER — Encounter: Payer: Self-pay | Admitting: Dietician

## 2021-11-29 ENCOUNTER — Encounter: Payer: Medicare Other | Attending: Nurse Practitioner | Admitting: Dietician

## 2021-11-29 DIAGNOSIS — E1142 Type 2 diabetes mellitus with diabetic polyneuropathy: Secondary | ICD-10-CM | POA: Insufficient documentation

## 2021-11-29 NOTE — Progress Notes (Signed)
Diabetes Self-Management Education  Visit Type: Follow-up  Appt. Start Time: 0955 Appt. End Time: 1030  11/29/2021  [name], identified by name and date of birth, is a 76 y.o. female with a diagnosis of Diabetes:  .   ASSESSMENT Patient is here today alone.  She was last seen in this office on 08/29/2021.  She states that she had a barium swallow this week and will be having and endoscopy and colonoscopy soon.  279 lbs increased from 260 lbs 08/29/2021.  Concerned about fluid retention. 333 lbs highest adult weight 2020 and lost to 198 lbs in 2021.  She was doing water aerobics at that time. She states that she is now eating late 7-8 pm due to helping her grandson. Swelling in her legs when she wakes. 2+ pitting edema currently Sleeps in a recliner due to severe pain when she gets up out of bed. A1C 5.7% 10/01/2021 She has filled out an application for [employer].   She would like to do water aerobics but transportation issues.  Currently cannot lift her arms above her shoulders. She got an eye exam. She states that she drinks Gatorade, water, OJ and has stopped most regular soda. Dentures hurt so she doesn't wear them.  Medical Hx: HTN, HLD, T2DM, Depression, GERD, Fibromyalgia, Anemia, depression, polyneuropathy, OSA on c-pap Medications: Prilosec, ozempic, vitamin D, MVI Labs: A1c - 5.7% 10/01/21, 6.4, GFR - 46.52 (low) Notable Signs/Symptoms: Lower leg edema, SOB  Weight hx: 59" 279 lbs 11/29/2021 260 lbs 08/27/2021 299 lbs 08/2020 198 lbs 2021.  Lost while doing water aerobics. 333 lbs 2020  Patient keeps her grandson weekdays while he is in school.  Hasn't cooked much since her mother died.  Her grandson only eats chicken nuggets, fries, and [place] sausage.  She enjoys vegetables but frequently eats crackers, chips or other easy snacks that are high in sodium. Dentures don't fit.  Can eat salad without dentures. Allergy:  pineapple which causes shortness of  breath Walks with a walker.  Unable to raise her arms above her head.  Likes water aerobics and is working on transportation. Works with Warehouse manager.    Weight 279 lb (126.6 kg). Body mass index is 54.49 kg/m.   Diabetes Self-Management Education - 11/29/21 1136       Visit Information   Visit Type Follow-up      Psychosocial Assessment   What is the hardest part about your diabetes right now, causing you the most concern, or is the most worrisome to you about your diabetes?   Making healty food and beverage choices    Self-care barriers None    Other persons present Patient    Patient Concerns Nutrition/Meal planning;Weight Control    Special Needs None    Preferred Learning Style No preference indicated    Learning Readiness Ready    How often do you need to have someone help you when you read instructions, pamphlets, or other written materials from your doctor or pharmacy? 1 - Never    What is the last grade level you completed in school? PhD      Pre-Education Assessment   Patient understands the diabetes disease and treatment process. Comprehends key points    Patient understands incorporating nutritional management into lifestyle. Needs Review    Patient undertands incorporating physical activity into lifestyle. Comprehends key points    Patient understands using medications safely. Comprehends key points    Patient understands monitoring blood glucose, interpreting and using results Comprehends  key points    Patient understands prevention, detection, and treatment of acute complications. Comprehends key points    Patient understands prevention, detection, and treatment of chronic complications. Compreheands key points    Patient understands how to develop strategies to address psychosocial issues. Comprehends key points    Patient understands how to develop strategies to promote health/change behavior. Needs Review      Complications   Last HgB A1C per patient/outside  source 5.7 %    How often do you check your blood sugar? 0 times/day (not testing)    Have you had a dilated eye exam in the past 12 months? Yes      Dietary Intake   Breakfast sausage gravy biscuit OR sausage McGriddle OR 1-2 strips bacon, egg, 1 slice white bread    Lunch 5 ritz crackers    Snack (afternoon) 1 bag chips    Dinner 12 ritz crackers    Beverage(s) water, 12 oz gatorade, juice, lactaid milk      Activity / Exercise   Activity / Exercise Type ADL's      Patient Education   Previous Diabetes Education Yes (please comment)   08/2021   Healthy Eating Role of diet in the treatment of diabetes and the relationship between the three main macronutrients and blood glucose level;Plate Method;Food label reading, portion sizes and measuring food.;Meal options for control of blood glucose level and chronic complications.;Other (comment)   low sodium, need for balanced meals   Being Active Helped patient identify appropriate exercises in relation to his/her diabetes, diabetes complications and other health issue.    Medications Reviewed patients medication for diabetes, action, purpose, timing of dose and side effects.    Diabetes Stress and Support Worked with patient to identify barriers to care and solutions      Individualized Goals (developed by patient)   Nutrition General guidelines for healthy choices and portions discussed    Medications take my medication as prescribed    Problem Solving Eating Pattern;Addressing barriers to behavior change      Patient Self-Evaluation of Goals - Patient rates self as meeting previously set goals (% of time)   Nutrition < 25% (hardly ever/never)    Physical Activity < 25% (hardly ever/never)    Medications >75% (most of the time)    Monitoring Not Applicable    Problem Solving and behavior change strategies  < 25% (hardly ever/never)    Reducing Risk (treating acute and chronic complications) < 34% (hardly ever/never)    Health Coping 25 -  50% (sometimes)      Post-Education Assessment   Patient understands the diabetes disease and treatment process. Demonstrates understanding / competency    Patient understands incorporating nutritional management into lifestyle. Comprehends key points    Patient undertands incorporating physical activity into lifestyle. Demonstrates understanding / competency    Patient understands using medications safely. Demonstrates understanding / competency    Patient understands monitoring blood glucose, interpreting and using results Demonstrates understanding / competency    Patient understands prevention, detection, and treatment of acute complications. Demonstrates understanding / competency    Patient understands prevention, detection, and treatment of chronic complications. Demonstrates understanding / competency    Patient understands how to develop strategies to address psychosocial issues. Comprehends key points    Patient understands how to develop strategies to promote health/change behavior. Comprehends key points      Outcomes   Expected Outcomes Demonstrated interest in learning but significant barriers to change  Future DMSE 4-6 wks    Program Status Not Completed      Subsequent Visit   Since your last visit have you continued or begun to take your medications as prescribed? Yes    Since your last visit have you experienced any weight changes? Gain    Weight Gain (lbs) 19             Individualized Plan for Diabetes Self-Management Training:   Learning Objective:  Patient will have a greater understanding of diabetes self-management. Patient education plan is to attend individual and/or group sessions per assessed needs and concerns.   Plan:   Patient Instructions  Aim to drink water and other beverages with zero carbohydrates.  Breakfast:  [place] yogurt and fruit  OR egg, 1 slice bread, fruit Lunch and dinner:  choose lean protein, vegetables, small portion starch  (sweet potato, beans, brown rice, [employer] bread, etc)  Find ways to increase the vegetables and fruit in your diet.   Nutrition quality.  Care for yourself.  Option for water aerobics?   Expected Outcomes:  Demonstrated interest in learning but significant barriers to change  Education material provided: [employer], [place] [facility] packet  If problems or questions, patient to contact team via:  Phone  Future DSME appointment: 4-6 wks

## 2021-11-29 NOTE — Patient Instructions (Addendum)
Aim to drink water and other beverages with zero carbohydrates.  Breakfast:  [place] yogurt and fruit  OR egg, 1 slice bread, fruit Lunch and dinner:  choose lean protein, vegetables, small portion starch (sweet potato, beans, brown rice, [employer] bread, etc)  Find ways to increase the vegetables and fruit in your diet.   Nutrition quality.  Care for yourself.  Option for water aerobics?

## 2021-11-30 DIAGNOSIS — I129 Hypertensive chronic kidney disease with stage 1 through stage 4 chronic kidney disease, or unspecified chronic kidney disease: Secondary | ICD-10-CM | POA: Diagnosis not present

## 2021-11-30 DIAGNOSIS — E1122 Type 2 diabetes mellitus with diabetic chronic kidney disease: Secondary | ICD-10-CM

## 2021-11-30 DIAGNOSIS — E785 Hyperlipidemia, unspecified: Secondary | ICD-10-CM | POA: Diagnosis not present

## 2021-11-30 DIAGNOSIS — N1832 Chronic kidney disease, stage 3b: Secondary | ICD-10-CM | POA: Diagnosis not present

## 2021-11-30 DIAGNOSIS — F32A Depression, unspecified: Secondary | ICD-10-CM | POA: Diagnosis not present

## 2021-11-30 DIAGNOSIS — Z7985 Long-term (current) use of injectable non-insulin antidiabetic drugs: Secondary | ICD-10-CM | POA: Diagnosis not present

## 2021-12-05 DIAGNOSIS — Z1212 Encounter for screening for malignant neoplasm of rectum: Secondary | ICD-10-CM | POA: Diagnosis not present

## 2021-12-05 DIAGNOSIS — Z1211 Encounter for screening for malignant neoplasm of colon: Secondary | ICD-10-CM | POA: Diagnosis not present

## 2021-12-13 ENCOUNTER — Other Ambulatory Visit: Payer: Self-pay | Admitting: Physical Medicine & Rehabilitation

## 2021-12-17 ENCOUNTER — Telehealth: Payer: Self-pay | Admitting: *Deleted

## 2021-12-17 NOTE — Patient Outreach (Signed)
  Care Coordination   Initial Visit Note   12/17/2021 Name: Sandra Brennan MRN: [id number] DOB: June 21, 1945  Sandra Brennan is a 76 y.o. year old female who sees Nche, Charlene Brooke, NP for primary care. I spoke with  Sandra Brennan by phone today.  What matters to the patients health and wellness today?  My blood pressure has been a little high    Goals Addressed             This Visit's Progress    Patient advised to follow up with Vaccines          SDOH assessments and interventions completed:  Yes     Care Coordination Interventions Activated:  Yes  Care Coordination Interventions:  Yes, provided   Follow up plan: Referral made to Care Coordinator    Encounter Outcome:  Pt. Visit Completed   [facility] [phone]

## 2021-12-25 DIAGNOSIS — R0602 Shortness of breath: Secondary | ICD-10-CM | POA: Diagnosis not present

## 2021-12-25 DIAGNOSIS — K224 Dyskinesia of esophagus: Secondary | ICD-10-CM | POA: Diagnosis not present

## 2021-12-25 DIAGNOSIS — K5900 Constipation, unspecified: Secondary | ICD-10-CM | POA: Diagnosis not present

## 2021-12-25 DIAGNOSIS — K219 Gastro-esophageal reflux disease without esophagitis: Secondary | ICD-10-CM | POA: Diagnosis not present

## 2021-12-27 ENCOUNTER — Encounter: Payer: Self-pay | Admitting: Dietician

## 2021-12-27 ENCOUNTER — Encounter: Payer: Medicare Other | Attending: Nurse Practitioner | Admitting: Dietician

## 2021-12-27 ENCOUNTER — Telehealth: Payer: Self-pay | Admitting: *Deleted

## 2021-12-27 DIAGNOSIS — E1142 Type 2 diabetes mellitus with diabetic polyneuropathy: Secondary | ICD-10-CM | POA: Insufficient documentation

## 2021-12-27 NOTE — Chronic Care Management (AMB) (Signed)
  Care Coordination   Note   12/27/2021 Name: Sandra Brennan MRN: [id number] DOB: 1945/10/29  Sandra Brennan is a 76 y.o. year old female who sees Nche, Charlene Brooke, NP for primary care. I reached out to [place] by phone today to offer care coordination services.  Ms. Vanacker was given information about Care Coordination services today including:   The Care Coordination services include support from the care team which includes your Nurse Coordinator, Clinical Social Worker, or Pharmacist.  The Care Coordination team is here to help remove barriers to the health concerns and goals most important to you. Care Coordination services are voluntary, and the patient may decline or stop services at any time by request to their care team member.   Care Coordination Consent Status: Patient agreed to services and verbal consent obtained.   Follow up plan:  Telephone appointment with care coordination team member scheduled for:  01/30/2022  Encounter Outcome:  Pt. Scheduled  Julian Hy, [place] Direct Dial: [phone]

## 2021-12-27 NOTE — Patient Instructions (Addendum)
Consider Arm Chair Exercise  Google "Sit and Be Fit"   Continue to avoid added salt Fast food has a lot of sodium.  It is best to avoid and make something simple at home.  Continue to increase your vegetable and fruit intake.  Call about getting your dentures adjusted.   Continue to take your medication as prescribed.

## 2021-12-27 NOTE — Progress Notes (Signed)
Diabetes Self-Management Education  Visit Type: Follow-up  Appt. Start Time: 1530 Appt. End Time: 1600  12/27/2021  [name], identified by name and date of birth, is a 76 y.o. female with a diagnosis of Diabetes:  .   ASSESSMENT Patient is here today alone.  She was last seen by this RD 11/29/2021 She is going to speak to her daughter who wants her to move in with them. Daughter has just gone through cancer treatment.  Son would also like for her to live with him.  He is in a wheelchair and his wife has health issues.  Patient does not want to put her children through the stress of care giving and would prefer to go to assisted living.  Is not using a c-pap yet and has been without this for 2 moths.  She states that she has a resulting cough.  She has a pulmonary appointment soon. Right leg very tight and swollen.  Patient trying to keep elevated. Does not cook with salt.  Does eat high sodium foods at times but states that she won't eat a food if it tastes salty. She will get transportation for appointments next year with her insurance.  Medical Hx: HTN, HLD, T2DM, Depression, GERD, Fibromyalgia, Anemia, depression, polyneuropathy, OSA on c-pap Medications: Prilosec, ozempic, vitamin D, MVI Labs: A1c - 5.7% 10/01/21, 6.4, GFR - 46.52 (low) Notable Signs/Symptoms: Lower leg edema, SOB   Weight hx: 59" 273 lbs 12/27/2021 279 lbs 11/29/2021 260 lbs 08/27/2021 299 lbs 08/2020 198 lbs 2021.  Lost while doing water aerobics. 333 lbs 2020   Patient keeps her grandson weekdays while he is in school.  Hasn't cooked much since her mother died.  Her grandson only eats chicken nuggets, fries, and [place] sausage.  She enjoys vegetables but frequently eats crackers, chips or other easy snacks that are high in sodium. Dentures don't fit.  Can eat salad without dentures. Allergy:  pineapple which causes shortness of breath Walks with a walker or cane..  Unable to raise her arms above  her head.  Likes water aerobics and is working on transportation. Works with [employer].    Weight 273 lb (123.8 kg). Body mass index is 53.32 kg/m.   Diabetes Self-Management Education - 12/27/21 1700       Visit Information   Visit Type Follow-up      Psychosocial Assessment   Other persons present Patient      Pre-Education Assessment   Patient understands the diabetes disease and treatment process. Comprehends key points    Patient understands incorporating nutritional management into lifestyle. Needs Review    Patient undertands incorporating physical activity into lifestyle. Comprehends key points    Patient understands using medications safely. Comprehends key points    Patient understands monitoring blood glucose, interpreting and using results Comprehends key points    Patient understands prevention, detection, and treatment of acute complications. Comprehends key points    Patient understands prevention, detection, and treatment of chronic complications. Compreheands key points    Patient understands how to develop strategies to address psychosocial issues. Comprehends key points    Patient understands how to develop strategies to promote health/change behavior. Needs Review      Dietary Intake   Breakfast Sausage Gravy Biscuit and OJ from [employer] none    Dinner baked fish, noodles, carrots, broccoli, fried cornbread    Beverage(s) water, juice, lactaid milk      Activity / Exercise   Activity /  Exercise Type ADL's      Patient Education   Previous Diabetes Education Yes (please comment)   11/2021   Healthy Eating Meal options for control of blood glucose level and chronic complications.    Being Active Helped patient identify appropriate exercises in relation to his/her diabetes, diabetes complications and other health issue.    Diabetes Stress and Support Worked with patient to identify barriers to care and solutions;Identified and addressed  patients feelings and concerns about diabetes      Individualized Goals (developed by patient)   Nutrition General guidelines for healthy choices and portions discussed    Physical Activity Exercise 3-5 times per week;15 minutes per day    Problem Solving Eating Pattern      Patient Self-Evaluation of Goals - Patient rates self as meeting previously set goals (% of time)   Nutrition 50 - 75 % (half of the time)    Physical Activity < 25% (hardly ever/never)    Medications >75% (most of the time)    Monitoring Not Applicable    Problem Solving and behavior change strategies  25 - 50% (sometimes)    Reducing Risk (treating acute and chronic complications) < 94% (hardly ever/never)    Health Coping 50 - 75 % (half of the time)      Post-Education Assessment   Patient understands the diabetes disease and treatment process. Demonstrates understanding / competency    Patient understands incorporating nutritional management into lifestyle. Needs Review    Patient undertands incorporating physical activity into lifestyle. Demonstrates understanding / competency    Patient understands using medications safely. Demonstrates understanding / competency    Patient understands monitoring blood glucose, interpreting and using results Demonstrates understanding / competency    Patient understands prevention, detection, and treatment of acute complications. Demonstrates understanding / competency    Patient understands prevention, detection, and treatment of chronic complications. Demonstrates understanding / competency    Patient understands how to develop strategies to address psychosocial issues. Comprehends key points    Patient understands how to develop strategies to promote health/change behavior. Comprehends key points      Outcomes   Expected Outcomes Demonstrated interest in learning but significant barriers to change    Future DMSE 2 months    Program Status Not Completed      Subsequent  Visit   Since your last visit have you continued or begun to take your medications as prescribed? Yes    Since your last visit have you experienced any weight changes? Loss    Weight Loss (lbs) 6             Individualized Plan for Diabetes Self-Management Training:   Learning Objective:  Patient will have a greater understanding of diabetes self-management. Patient education plan is to attend individual and/or group sessions per assessed needs and concerns.   Plan:   Patient Instructions  Consider Arm Chair Exercise  Google "Sit and Be Fit"   Continue to avoid added salt Fast food has a lot of sodium.  It is best to avoid and make something simple at home.  Continue to increase your vegetable and fruit intake.  Call about getting your dentures adjusted.   Continue to take your medication as prescribed.   Expected Outcomes:  Demonstrated interest in learning but significant barriers to change  Education material provided:   If problems or questions, patient to contact team via:  Phone  Future DSME appointment: 2 months

## 2022-01-01 ENCOUNTER — Ambulatory Visit (INDEPENDENT_AMBULATORY_CARE_PROVIDER_SITE_OTHER): Payer: Medicare Other | Admitting: Nurse Practitioner

## 2022-01-01 ENCOUNTER — Encounter: Payer: Self-pay | Admitting: Nurse Practitioner

## 2022-01-01 VITALS — BP 152/76 | HR 89 | Temp 97.5°F | Ht 60.0 in | Wt 270.4 lb

## 2022-01-01 DIAGNOSIS — I10 Essential (primary) hypertension: Secondary | ICD-10-CM | POA: Diagnosis not present

## 2022-01-01 DIAGNOSIS — E782 Mixed hyperlipidemia: Secondary | ICD-10-CM

## 2022-01-01 DIAGNOSIS — E1142 Type 2 diabetes mellitus with diabetic polyneuropathy: Secondary | ICD-10-CM | POA: Diagnosis not present

## 2022-01-01 DIAGNOSIS — N1832 Chronic kidney disease, stage 3b: Secondary | ICD-10-CM | POA: Diagnosis not present

## 2022-01-01 DIAGNOSIS — R600 Localized edema: Secondary | ICD-10-CM | POA: Diagnosis not present

## 2022-01-01 LAB — LIPID PANEL
Cholesterol: 135 mg/dL (ref 0–200)
HDL: 59.6 mg/dL (ref >39.00)
LDL Cholesterol: 61 mg/dL (ref 0–99)
NonHDL: 75.75
Total CHOL/HDL Ratio: 2
Triglycerides: 73 mg/dL (ref 0.0–149.0)
VLDL: 14.6 mg/dL (ref 0.0–40.0)

## 2022-01-01 LAB — RENAL FUNCTION PANEL
Albumin: 3.8 g/dL (ref 3.5–5.2)
BUN: 12 mg/dL (ref 6–23)
CO2: 26 mEq/L (ref 19–32)
Calcium: 8.7 mg/dL (ref 8.4–10.5)
Chloride: 105 mEq/L (ref 96–112)
Creatinine, Ser: 1 mg/dL (ref 0.40–1.20)
GFR: 55.47 mL/min — ABNORMAL LOW (ref >60.00)
Glucose, Bld: 73 mg/dL (ref 70–99)
Phosphorus: 3.7 mg/dL (ref 2.3–4.6)
Potassium: 4.3 mEq/L (ref 3.5–5.1)
Sodium: 140 mEq/L (ref 135–145)

## 2022-01-01 MED ORDER — FUROSEMIDE 20 MG PO TABS: 10.0000 mg | ORAL_TABLET | Freq: Every day | ORAL | 0 refills | Status: DC

## 2022-01-01 MED ORDER — LISINOPRIL 30 MG PO TABS: 30.0000 mg | ORAL_TABLET | Freq: Every day | ORAL | 1 refills | Status: DC

## 2022-01-01 NOTE — Patient Instructions (Signed)
Increase lisinopril to '30mg'$  daily Add furosemide '10mg'$  daily Go to lab

## 2022-01-01 NOTE — Assessment & Plan Note (Addendum)
Resolved anemia Persistent LE edema Repeat renal function

## 2022-01-01 NOTE — Assessment & Plan Note (Signed)
Improve glucose control with ozempic. It also promoted 9lbs weight loss in last 57month. Denies constipation or worsening GERD.  Maintain med dose F/up in 354month

## 2022-01-01 NOTE — Assessment & Plan Note (Signed)
BP not at goal. Associated with intermittent headaches BP Readings from Last 3 Encounters:  01/01/22 (!) 152/76  10/15/21 (!) 144/81  10/01/21 (!) 142/78   Increase lisinopril to '30mg'$  Check BMP F/up in 21month

## 2022-01-01 NOTE — Assessment & Plan Note (Signed)
Chronic, R>L, no signs of cellulitis Possibly due to venous insufficiency Unable to use compression stocking due to poor dexterity of hands and unable to reach feet. Normal BNP CXR indicates mild cardiomegaly.  Start furosemide '10mg'$  daily Watch renal function closely

## 2022-01-01 NOTE — Assessment & Plan Note (Signed)
Repeat lipid panel Continue atorvastatin

## 2022-01-01 NOTE — Progress Notes (Signed)
Established Patient Visit  Patient: Sandra Brennan   DOB: Aug 04, 1945   76 y.o. Female  MRN: [id number] Visit Date: 01/01/2022  Subjective:    Chief Complaint  Patient presents with   Office Visit    DM/ HTN  Pt fasting  Doesn't check BP or BS  No concerns Had cologard just waiting for results    HPI Hypertension BP not at goal. Associated with intermittent headaches BP Readings from Last 3 Encounters:  01/01/22 (!) 152/76  10/15/21 (!) 144/81  10/01/21 (!) 142/78   Increase lisinopril to '30mg'$  Check BMP F/up in 9month CKD (chronic kidney disease) stage 3, GFR 30-59 ml/min (HCC) Resolved anemia Persistent LE edema Repeat renal function  Hyperlipidemia Repeat lipid panel Continue atorvastatin  Bilateral leg edema Chronic, R>L, no signs of cellulitis Possibly due to venous insufficiency Unable to use compression stocking due to poor dexterity of hands and unable to reach feet. Normal BNP CXR indicates mild cardiomegaly.  Start furosemide '10mg'$  daily Watch renal function closely   Type 2 diabetes mellitus with diabetic polyneuropathy, without long-term current use of insulin (HCC) Improve glucose control with ozempic. It also promoted 9lbs weight loss in last 248month Denies constipation or worsening GERD.  Maintain med dose F/up in 47m26monthWt Readings from Last 3 Encounters:  01/01/22 270 lb 6.4 oz (122.7 kg)  12/27/21 273 lb (123.8 kg)  11/29/21 279 lb (126.6 kg)    Reviewed medical, surgical, and social history today  Medications: Outpatient Medications Prior to Visit  Medication Sig   albuterol (VENTOLIN HFA) 108 (90 Base) MCG/ACT inhaler INHALE 1 PUFF INTO THE LUNGS EVERY 6 (SIX) HOURS AS NEEDED FOR SHORTNESS OF BREATH OR WHEEZING.   allopurinol (ZYLOPRIM) 100 MG tablet Take 1 tablet (100 mg total) by mouth daily.   aspirin EC 81 MG tablet Take 81 mg by mouth daily.   atorvastatin (LIPITOR) 10 MG tablet TAKE 1 TABLET BY MOUTH  DAILY  AT 6 PM.   Carboxymethylcellulose Sodium (EYE DROPS OP) Apply to eye. Lubricant eye drops   cetirizine (ZYRTEC) 10 MG tablet Take 10 mg by mouth daily as needed.   cholecalciferol (VITAMIN D3) 25 MCG (1000 UNIT) tablet Take 1,000 Units by mouth daily.   dextromethorphan-guaiFENesin (MUCINEX DM) 30-600 MG 12hr tablet Take 1 tablet by mouth 2 (two) times daily.   Multiple Vitamin (MULTIVITAMIN) tablet Take 1 tablet by mouth daily.   omeprazole (PRILOSEC) 20 MG capsule TAKE 1 CAPSULE BY MOUTH EVERY DAY   pregabalin (LYRICA) 150 MG capsule TAKE 1 CAPSULE BY MOUTH TWICE A DAY   Rimegepant Sulfate (NURTEC) 75 MG TBDP Take 75 mg by mouth as needed (take 1 at onset of headache, max is 75 in 24 hours).   Semaglutide,0.25 or 0.'5MG'$ /DOS, (OZEMPIC, 0.25 OR 0.5 MG/DOSE,) 2 MG/1.5ML SOPN Inject 0.5 mg into the skin once a week.   Sennosides 8.6 MG CAPS Take 1 capsule by mouth daily.   SYMBICORT 160-4.5 MCG/ACT inhaler Inhale 2 puffs into the lungs in the morning and at bedtime. Rinse mouth after each use   traMADol (ULTRAM) 50 MG tablet Take 1 tablet (50 mg total) by mouth 3 (three) times daily.   [DISCONTINUED] lisinopril (ZESTRIL) 20 MG tablet Take 1 tablet (20 mg total) by mouth daily.   No facility-administered medications prior to visit.   Reviewed past medical and social history.   ROS per HPI above  Last  metabolic panel Lab Results  Component Value Date   GLUCOSE 107 (H) 08/25/2021   NA 139 08/25/2021   K 4.1 08/25/2021   CL 102 08/25/2021   CO2 26 08/25/2021   BUN 11 08/25/2021   CREATININE 1.26 (H) 08/25/2021   GFRNONAA 45 (L) 08/25/2021   CALCIUM 9.2 08/25/2021   PHOS 3.7 08/23/2020   PROT 6.9 08/25/2021   ALBUMIN 3.4 (L) 08/25/2021   LABGLOB 2.6 07/14/2014   AGRATIO 1.7 07/14/2014   BILITOT 0.9 08/25/2021   ALKPHOS 75 08/25/2021   AST 31 08/25/2021   ALT 10 08/25/2021   ANIONGAP 11 08/25/2021   Last hemoglobin A1c Lab Results  Component Value Date   HGBA1C 5.7 (A)  10/01/2021   Last thyroid functions Lab Results  Component Value Date   TSH 1.59 04/05/2021   Last vitamin D Lab Results  Component Value Date   VD25OH 83.12 04/05/2021      Objective:  BP (!) 152/76   Pulse 89   Temp (!) 97.5 F (36.4 C) (Temporal)   Ht 5' (1.524 m)   Wt 270 lb 6.4 oz (122.7 kg)   SpO2 96%   BMI 52.81 kg/m      Physical Exam Cardiovascular:     Rate and Rhythm: Normal rate and regular rhythm.     Pulses: Normal pulses.     Heart sounds: Murmur heard.  Pulmonary:     Effort: Pulmonary effort is normal.     Breath sounds: Normal breath sounds.  Musculoskeletal:     Right lower leg: Edema present.     Left lower leg: Edema present.     Comments: R>L  Neurological:     Mental Status: She is alert and oriented to person, place, and time.     No results found for any visits on 01/01/22.    Assessment & Plan:    Problem List Items Addressed This Visit       Cardiovascular and Mediastinum   Hypertension - Primary (Chronic)    BP not at goal. Associated with intermittent headaches BP Readings from Last 3 Encounters:  01/01/22 (!) 152/76  10/15/21 (!) 144/81  10/01/21 (!) 142/78   Increase lisinopril to '30mg'$  Check BMP F/up in 87month     Relevant Medications   lisinopril (ZESTRIL) 30 MG tablet   furosemide (LASIX) 20 MG tablet     Endocrine   Type 2 diabetes mellitus with diabetic polyneuropathy, without long-term current use of insulin (HCC) (Chronic)    Improve glucose control with ozempic. It also promoted 9lbs weight loss in last 2744month Denies constipation or worsening GERD.  Maintain med dose F/up in 44m79month    Relevant Medications   lisinopril (ZESTRIL) 30 MG tablet     Genitourinary   CKD (chronic kidney disease) stage 3, GFR 30-59 ml/min (HCC)    Resolved anemia Persistent LE edema Repeat renal function      Relevant Orders   Renal Function Panel     Other   Bilateral leg edema    Chronic, R>L, no signs of  cellulitis Possibly due to venous insufficiency Unable to use compression stocking due to poor dexterity of hands and unable to reach feet. Normal BNP CXR indicates mild cardiomegaly.  Start furosemide '10mg'$  daily Watch renal function closely       Relevant Medications   furosemide (LASIX) 20 MG tablet   Hyperlipidemia    Repeat lipid panel Continue atorvastatin      Relevant Medications  lisinopril (ZESTRIL) 30 MG tablet   furosemide (LASIX) 20 MG tablet   Other Relevant Orders   Lipid panel   Return in about 4 weeks (around 01/29/2022) for HTN and edema.     Wilfred Lacy, NP

## 2022-01-06 DIAGNOSIS — G4733 Obstructive sleep apnea (adult) (pediatric): Secondary | ICD-10-CM | POA: Diagnosis not present

## 2022-01-11 ENCOUNTER — Other Ambulatory Visit: Payer: Self-pay | Admitting: Nurse Practitioner

## 2022-01-11 ENCOUNTER — Other Ambulatory Visit: Payer: Self-pay | Admitting: Physical Medicine & Rehabilitation

## 2022-01-11 DIAGNOSIS — E1142 Type 2 diabetes mellitus with diabetic polyneuropathy: Secondary | ICD-10-CM

## 2022-01-11 DIAGNOSIS — R600 Localized edema: Secondary | ICD-10-CM

## 2022-01-15 ENCOUNTER — Encounter: Payer: Self-pay | Admitting: Registered Nurse

## 2022-01-15 ENCOUNTER — Encounter: Payer: Medicare Other | Attending: Physical Medicine & Rehabilitation | Admitting: Registered Nurse

## 2022-01-15 VITALS — BP 139/79 | HR 84 | Ht 60.0 in | Wt 268.0 lb

## 2022-01-15 DIAGNOSIS — M797 Fibromyalgia: Secondary | ICD-10-CM

## 2022-01-15 DIAGNOSIS — G894 Chronic pain syndrome: Secondary | ICD-10-CM | POA: Diagnosis not present

## 2022-01-15 DIAGNOSIS — Z5181 Encounter for therapeutic drug level monitoring: Secondary | ICD-10-CM

## 2022-01-15 DIAGNOSIS — M7062 Trochanteric bursitis, left hip: Secondary | ICD-10-CM | POA: Diagnosis not present

## 2022-01-15 DIAGNOSIS — Z79899 Other long term (current) drug therapy: Secondary | ICD-10-CM

## 2022-01-15 DIAGNOSIS — G5793 Unspecified mononeuropathy of bilateral lower limbs: Secondary | ICD-10-CM

## 2022-01-15 DIAGNOSIS — M7061 Trochanteric bursitis, right hip: Secondary | ICD-10-CM

## 2022-01-15 DIAGNOSIS — M5416 Radiculopathy, lumbar region: Secondary | ICD-10-CM | POA: Diagnosis not present

## 2022-01-15 MED ORDER — TRAMADOL HCL 50 MG PO TABS: 50.0000 mg | ORAL_TABLET | Freq: Three times a day (TID) | ORAL | 5 refills | Status: DC

## 2022-01-15 NOTE — Progress Notes (Signed)
Subjective:    Patient ID: Sandra Brennan, female    DOB: 07/23/1945, 76 y.o.   MRN: [id number]  HPI: Sandra Brennan is a 76 y.o. female who returns for follow up appointment for chronic pain and medication refill.She states her pain is located in her lower back radiating into her right hip and right lower extremity. Bilateral hips R>L and bilateral feet with tingling and burning. She rates her pain 9. Her current exercise regime is walking short distances with walker.   Mr. Losey Morphine equivalent is 15.00 MME.   Last Oral Swab was Performed on 10/15/2021, it was consistent.      Pain Inventory Average Pain 10 Pain Right Now 9 My pain is constant, sharp, and aching  In the last 24 hours, has pain interfered with the following? General activity 7 Relation with others 7 Enjoyment of life 10 What TIME of day is your pain at its worst? morning  and night Sleep (in general) Poor  Pain is worse with: walking, inactivity, and standing Pain improves with: medication Relief from Meds: 7  Family History  Problem Relation Age of Onset   Arthritis Mother    Arthritis Father    Colon cancer Father    Diabetes Sister    Arthritis Sister    Diabetes Maternal Grandmother    Arthritis Maternal Grandmother    Diabetes Maternal Grandfather    Arthritis Maternal Grandfather    Migraines Neg Hx    Sleep apnea Neg Hx    Social History   Socioeconomic History   Marital status: Widowed    Spouse name: Not on file   Number of children: 5   Years of education: PhD   Highest education level: Not on file  Occupational History   Occupation: Retired  Tobacco Use   Smoking status: Former    Packs/day: 0.12    Years: 2.00    Total pack years: 0.24    Types: Cigarettes   Smokeless tobacco: Never   Tobacco comments:    08/03/2012 "quit 30 years ago"  Vaping Use   Vaping Use: Never used  Substance and Sexual Activity   Alcohol use: No    Alcohol/week: 0.0 standard drinks of alcohol     Comment: 08/03/2012 "used to be a beeralcoholic"; stopped ~ 30 yr ago"   Drug use: No   Sexual activity: Never  Other Topics Concern   Not on file  Social History Narrative   Lives at home with her mother and caregiver.- mother past away 01/2021 and now lives alone.   Occasional use of caffeine.   Right-handed.   Social Determinants of Health   Financial Resource Strain: Low Risk  (11/26/2021)   Overall Financial Resource Strain (CARDIA)    Difficulty of Paying Living Expenses: Not hard at all  Food Insecurity: No Food Insecurity (11/26/2021)   Hunger Vital Sign    Worried About Running Out of Food in the Last Year: Never true    Ran Out of Food in the Last Year: Never true  Transportation Needs: No Transportation Needs (11/26/2021)   PRAPARE - Hydrologist (Medical): No    Lack of Transportation (Non-Medical): No  Physical Activity: Insufficiently Active (11/26/2021)   Exercise Vital Sign    Days of Exercise per Week: 3 days    Minutes of Exercise per Session: 30 min  Stress: No Stress Concern Present (11/26/2021)   [place]  Feeling of Stress : Not at all  Social Connections: Moderately Isolated (11/26/2021)   Social Connection and Isolation Panel [NHANES]    Frequency of Communication with Friends and Family: More than three times a week    Frequency of Social Gatherings with Friends and Family: More than three times a week    Attends Religious Services: More than 4 times per year    Active Member of [employer] or Organizations: No    Attends Archivist Meetings: Never    Marital Status: Widowed   Past Surgical History:  Procedure Laterality Date   ABDOMINAL HYSTERECTOMY  1990's   GANGLION CYST EXCISION Right 1980's   "wrist" (08-10-2012)   JOINT REPLACEMENT     KNEE LIGAMENT RECONSTRUCTION Right 1980's   TONSILLECTOMY  ~ [place] Left 2012/08/10    TOTAL HIP ARTHROPLASTY Left 2012/08/10   Procedure: TOTAL HIP ARTHROPLASTY;  Surgeon: Garald Balding, MD;  Location: [facility];  Service: Orthopedics;  Laterality: Left;  Left Total Hip Arthroplasty   TOTAL HIP ARTHROPLASTY Left 08/28/2012   Procedure: Irrigation and Debridement hip ;  Surgeon: Mcarthur Rossetti, MD;  Location: [facility];  Service: Orthopedics;  Laterality: Left;   TOTAL KNEE ARTHROPLASTY Left 2001   TOTAL KNEE ARTHROPLASTY Right 2004   Past Surgical History:  Procedure Laterality Date   ABDOMINAL HYSTERECTOMY  1990's   GANGLION CYST EXCISION Right 1980's   "wrist" (08-10-2012)   JOINT REPLACEMENT     KNEE LIGAMENT RECONSTRUCTION Right 1980's   TONSILLECTOMY  ~ [place] Left 08-10-12   TOTAL HIP ARTHROPLASTY Left 08/10/12   Procedure: TOTAL HIP ARTHROPLASTY;  Surgeon: Garald Balding, MD;  Location: [facility];  Service: Orthopedics;  Laterality: Left;  Left Total Hip Arthroplasty   TOTAL HIP ARTHROPLASTY Left 08/28/2012   Procedure: Irrigation and Debridement hip ;  Surgeon: Mcarthur Rossetti, MD;  Location: [facility];  Service: Orthopedics;  Laterality: Left;   TOTAL KNEE ARTHROPLASTY Left 2001   TOTAL KNEE ARTHROPLASTY Right 2004   Past Medical History:  Diagnosis Date   Acute nontraumatic kidney injury ([place]) 05/05/2013   Acute posthemorrhagic anemia 09/06/2012   Anemia    Arthritis    "plenty" (2012/08/10)   Asthma    Chest pain 03/30/2016   Chronic lower back pain    "they say I need a whole new spinal column" (10-Aug-2012)   COPD (chronic obstructive pulmonary disease) (HCC)    Degenerative arthritis    "all over" (08/10/12)   Fx ankle    GERD (gastroesophageal reflux disease)    Gout 05/05/2013   Hypertension    Migraines    "used to; totally stopped when I quit drinking 30 yr ago" (08/10/12)   Myalgia and myositis    Myocardial infarction [facility]) 1992   Aug 10, 2012 "mild MI when son died "   Obesity    Osteoporosis    "all over" (10-Aug-2012)    Shortness of breath    when stressed.   Sleep apnea    "never did fix my machine; haven't used one for 5 years; I've lost 116# since then; no problems now" (2012-08-10)   Type II diabetes mellitus ([place])    "borderline; don't test; take Metformin" (08/10/12)   Ht 5' (1.524 m)   Wt 268 lb (121.6 kg)   BMI 52.34 kg/m   Opioid Risk Score:   Fall Risk Score:  `1  Depression screen [facility] 2/9  01/15/2022   10:44 AM 01/01/2022   11:13 AM 11/26/2021    1:51 PM 10/15/2021   10:24 AM 08/29/2021    9:26 AM 08/01/2021    4:27 PM 07/01/2021    8:55 AM  Depression screen PHQ 2/9  Decreased Interest 1 0 0 0 0 1 0  Down, Depressed, Hopeless 1 0 0 0 0 0 0  PHQ - 2 Score 2 0 0 0 0 1 0  Altered sleeping  0    2 3  Tired, decreased energy  0    0 1  Change in appetite  3    0 3  Feeling bad or failure about yourself   0    0 0  Trouble concentrating  0    0 0  Moving slowly or fidgety/restless  0    0 0  Suicidal thoughts  0    0 0  PHQ-9 Score  '3    3 7  '$ Difficult doing work/chores  Not difficult at all    Somewhat difficult Not difficult at all    Review of Systems  Musculoskeletal:  Positive for arthralgias, back pain and gait problem.       Pain all over the body  Neurological:  Positive for headaches.  All other systems reviewed and are negative.     Objective:   Physical Exam Vitals and nursing note reviewed.  Constitutional:      Appearance: Normal appearance.  Cardiovascular:     Rate and Rhythm: Normal rate and regular rhythm.     Pulses: Normal pulses.     Heart sounds: Normal heart sounds.  Pulmonary:     Effort: Pulmonary effort is normal.     Breath sounds: Normal breath sounds.  Musculoskeletal:     Cervical back: Normal range of motion and neck supple.     Comments: Normal Muscle Bulk and Muscle Testing Reveals:  Upper Extremities: Decreased ROM  45 Degrees and Muscle Strength 5/5 Thoracic and Lumbar Hypersensitivity Bilateral Greater Trochanter Tenderness Lower  Extremities: Decreased ROM and Muscle Strength 4/5 Left Lower Extremity Flexion Produces Pain into her Left Patella Arises from Chair slowly using walker for support Narrow Based  Gait     Skin:    General: Skin is warm and dry.  Neurological:     Mental Status: She is alert and oriented to person, place, and time.  Psychiatric:        Mood and Affect: Mood normal.        Behavior: Behavior normal.         Assessment & Plan:  1. Chronic  Bilateral Shoulder Pain: Continue HEP as Tolerated.  Continue to Monitor. 10/15/2021 2. Lumbar Spinal Stenosis: Continue HEP as Tolerated . Continue to Monitor. 10/15/2021 3. Fibromyalgia: Continue HEP as Tolerated. Continue to Monitor. 10/15/2021 4. Bilateral Greater Trochanter Bursitis: Continue to alternate Ice and Heat Therapy. Continue to Monitor. 10/15/2021. 5. Bilateral Lower Extremities with Neuropathic Pain: Continue Pregablin. Continue to Monitor. 10/15/2021 6.. Chronic Pain Syndrome: Continue : Increased : Tramadol 50 mg one tablet QID as needed for pain. She will call office on Monday with update on Medication change. She verbalizes understanding.  We will continue the opioid monitoring program, this consists of regular clinic visits, examinations, urine drug screen, pill counts as well as use of [place] Controlled Substance Reporting system. A 12 month History has been reviewed on the [place] Controlled Substance Reporting System on 10/15/2021.  Continue to monitor.  F/U in 6 months

## 2022-01-27 ENCOUNTER — Encounter: Payer: Self-pay | Admitting: Nurse Practitioner

## 2022-01-28 ENCOUNTER — Ambulatory Visit (INDEPENDENT_AMBULATORY_CARE_PROVIDER_SITE_OTHER): Payer: Medicare Other | Admitting: Adult Health

## 2022-01-28 ENCOUNTER — Encounter: Payer: Self-pay | Admitting: Adult Health

## 2022-01-28 VITALS — BP 144/80 | HR 75 | Ht <= 58 in | Wt 274.6 lb

## 2022-01-28 DIAGNOSIS — G4485 Primary stabbing headache: Secondary | ICD-10-CM

## 2022-01-28 DIAGNOSIS — G4733 Obstructive sleep apnea (adult) (pediatric): Secondary | ICD-10-CM | POA: Diagnosis not present

## 2022-01-28 DIAGNOSIS — G43009 Migraine without aura, not intractable, without status migrainosus: Secondary | ICD-10-CM | POA: Diagnosis not present

## 2022-01-28 NOTE — Patient Instructions (Signed)
Home sleep test Sed rate. CRP level today If your symptoms worsen or you develop new symptoms please let us know.

## 2022-01-28 NOTE — Progress Notes (Signed)
PATIENT: Sandra Brennan DOB: 1945/06/16  REASON FOR VISIT: follow up HISTORY FROM: patient  Chief Complaint  Patient presents with   Follow-up    Rm 19, alone.  Not going well with cpap.  Has issues with drainage. Has fragmented sleep using the machine.  Did not bring machine or card. Set up 01-03-2015.  Needs new machine.     HISTORY OF PRESENT ILLNESS: Today 01/28/22: Sandra Brennan is a 76 year old female with a hsitory of OSA on CPAP and migraines. She returns today for follow-up. Reports that she does not use CPAP consistently. Reports that she needs a new machine.   Reports that she has not had many migraines.   States that she has had a new type of headache. Sharp pain on the left side- temporal region. Feels like a pin sticking down the top of the head. Can last about 1-2 secs. She does take nurtec to help prevent it from coming back. Happens about 1 time a week. This type of headache started about one month ago. Reports pain radiates to behind the eyes.    01/28/21: Sandra Brennan is a 76 year old female with a history of chronic migraine headaches and obstructive sleep apnea on CPAP.  She returns today for follow-up.  She states that she was in the hospital for E. coli and then rehab.  She states during that time she was unable to use the CPAP.  She reports that her migraines have increased in the last month due to the death of her mother and sister.  She states that when she does take Nurtec her headache resolves fairly quickly.  She continues on Lyrica prescribed by PCP.    07/23/20: Sandra Brennan is a 76 year old female with history of chronic migraine headaches and OSA on CPAP.  Has not been able to tolerate Topamax, nortriptyline.  Already on daily Lyrica and tramadol for chronic pain, doses increased to help with general body pain, is helping some. Has been in PT for falling, now using rolling walker. Started getting vibration sensation in hips and legs, goes down to her legs, comes  and goes. Seeing pain management, PCP. Headaches are left sided, stabbing, afterwards head is sore to touch, happening once a week. 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 Taking Tylenol Extra Strength, calms it. Feels sensitive to sound with headache. Weight is up 295 lbs, up 8 lbs. Going to [facility] June. Needs to see dentist, has top and bottom set that are poorly fitting. Headaches have improved since last seen.   Update 01/23/2020 SS: Sandra Brennan is a 76 year old female with history of chronic migraine headaches and OSA on CPAP. Could not tolerate Topamax for headaches secondary to fatigue, nortriptyline was discontinued due to possible interaction with tramadol.  Is taking tramadol and Lyrica for chronic pain, fibromyalgia.  Of recent, has been under more stress, more frequent headaches, once a week, but may last 1-3 days, Tylenol is not helpful. When stress is well controlled, headaches do well.  In the last year, gained about 15 pounds, has not been exercising.  Has asthma, more SOB with exertion, using inhaler.  Headaches are on the left temporal area, caring for her mother, has Alzheimer's, takes care of her grandkids.  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 / Recent CPAP download indicates good compliance, used 29/30 days, 26/30 days greater than 4 hours (87%), average usage days used  6 hours 40 minutes, set pressure 6 cm water, leak 95th percentile 26.7, AHI 4.1. Uses nasal mask, felt to have leaking in tubing, just got new tubing about 2 weeks ago. Still has dry mouth, but sleeps with mouth open. Here today for follow-up unaccompanied.  HISTORY Some 07/19/2019 JM: Sandra Brennan 76 year old female with history of chronic migraine headaches and OSA on CPAP.  When last seen she was started on Topamax.  She reports ongoing headaches approximately 2-3 times monthly typically located left temporal and frontal with  pressure type sensation.  At times, incontinent of phonophobia and blurred vision but no other associated features.  She self discontinued Topamax due to increased fatigue and "feeling out of it".  At times, headache will subside with use of Tylenol but other times headaches can be debilitating without benefit of Tylenol.  She does report worsening headaches with increased stressors but at times can occur without aggravating factors.   She endorses ongoing compliance with CPAP for OSA management with compliance report from 06/18/2019 -[id number] or 30 usage days with 29 days greater than 4 hours for 97% compliance with residual AHI 1.2.  Set pressure at 6 cm H2O with EPR level 1.  Leaks in the 95th percentile 26.5.  She does report frequently feeling leaks coming from her tubing and recently replaced tubing with only short-term benefit.  Also complains of continued dry mouth and did not increase humidity as previously recommended at prior visit.   REVIEW OF SYSTEMS: Out of a complete 14 system review of symptoms, the patient complains only of the following symptoms, and all other reviewed systems are negative. ESS 10  ALLERGIES: Allergies  Allergen Reactions   Cymbalta [Duloxetine Hcl] Anaphylaxis   Pineapple Shortness Of Breath   Sulfa Antibiotics Hives, Shortness Of Breath and Itching    REACTION: unknown   Influenza Vaccines Hives and Itching   Penicillins Hives   Theophyllines Hives    HOME MEDICATIONS: Outpatient Medications Prior to Visit  Medication Sig Dispense Refill   albuterol (VENTOLIN HFA) 108 (90 Base) MCG/ACT inhaler INHALE 1 PUFF INTO THE LUNGS EVERY 6 (SIX) HOURS AS NEEDED FOR SHORTNESS OF BREATH OR WHEEZING. 6.7 each 3   allopurinol (ZYLOPRIM) 100 MG tablet Take 1 tablet (100 mg total) by mouth daily. 90 tablet 3   aspirin EC 81 MG tablet Take 81 mg by mouth daily.     atorvastatin (LIPITOR) 10 MG tablet TAKE 1 TABLET BY MOUTH  DAILY AT 6 PM. 90 tablet 3    Carboxymethylcellulose Sodium (EYE DROPS OP) Apply to eye. Lubricant eye drops     cetirizine (ZYRTEC) 10 MG tablet Take 10 mg by mouth daily as needed.     cholecalciferol (VITAMIN D3) 25 MCG (1000 UNIT) tablet Take 1,000 Units by mouth daily.     dextromethorphan-guaiFENesin (MUCINEX DM) 30-600 MG 12hr tablet Take 1 tablet by mouth 2 (two) times daily.     furosemide (LASIX) 20 MG tablet TAKE 1/2 TABLET BY MOUTH DAILY 30 tablet 0   lisinopril (ZESTRIL) 30 MG tablet Take 1 tablet (30 mg total) by mouth daily. 90 tablet 1   Multiple Vitamin (MULTIVITAMIN) tablet Take 1 tablet by mouth daily.     omeprazole (PRILOSEC) 20 MG capsule TAKE 1 CAPSULE BY MOUTH EVERY DAY 90 capsule 1   pregabalin (LYRICA) 150 MG capsule TAKE 1 CAPSULE BY MOUTH TWICE A DAY 60 capsule 5   Rimegepant Sulfate (NURTEC) 75 MG TBDP Take 75 mg by mouth as needed (  take 1 at onset of headache, max is 75 in 24 hours). 10 tablet 11   Semaglutide,0.25 or 0.'5MG'$ /DOS, (OZEMPIC, 0.25 OR 0.5 MG/DOSE,) 2 MG/3ML SOPN INJECT 0.5 MG INTO THE SKIN ONCE A WEEK. 3 mL 2   Sennosides 8.6 MG CAPS Take 1 capsule by mouth daily.     SYMBICORT 160-4.5 MCG/ACT inhaler Inhale 2 puffs into the lungs in the morning and at bedtime. Rinse mouth after each use 30.6 each 5   traMADol (ULTRAM) 50 MG tablet Take 1 tablet (50 mg total) by mouth 3 (three) times daily. 90 tablet 5   FLUoxetine (PROZAC) 10 MG capsule Take 10 mg by mouth daily. (Patient not taking: Reported on 01/28/2022)     No facility-administered medications prior to visit.    PAST MEDICAL HISTORY: Past Medical History:  Diagnosis Date   Acute nontraumatic kidney injury ([place]) 05/05/2013   Acute posthemorrhagic anemia 09/06/2012   Anemia    Arthritis    "plenty" (08-26-12)   Asthma    Chest pain 03/30/2016   Chronic lower back pain    "they say I need a whole new spinal column" (08-26-12)   COPD (chronic obstructive pulmonary disease) (HCC)    Degenerative arthritis    "all over"  (August 26, 2012)   Fx ankle    GERD (gastroesophageal reflux disease)    Gout 05/05/2013   Hypertension    Migraines    "used to; totally stopped when I quit drinking 30 yr ago" (Aug 26, 2012)   Myalgia and myositis    Myocardial infarction [facility]) 1992   26-Aug-2012 "mild MI when son died "   Obesity    Osteoporosis    "all over" (2012/08/26)   Shortness of breath    when stressed.   Sleep apnea    "never did fix my machine; haven't used one for 5 years; I've lost 116# since then; no problems now" (2012/08/26)   Type II diabetes mellitus ([place])    "borderline; don't test; take Metformin" (26-Aug-2012)    PAST SURGICAL HISTORY: Past Surgical History:  Procedure Laterality Date   ABDOMINAL HYSTERECTOMY  1990's   GANGLION CYST EXCISION Right 1980's   "wrist" (Aug 26, 2012)   JOINT REPLACEMENT     KNEE LIGAMENT RECONSTRUCTION Right 1980's   TONSILLECTOMY  ~ [place] Left Aug 26, 2012   TOTAL HIP ARTHROPLASTY Left 26-Aug-2012   Procedure: TOTAL HIP ARTHROPLASTY;  Surgeon: Garald Balding, MD;  Location: [place];  Service: Orthopedics;  Laterality: Left;  Left Total Hip Arthroplasty   TOTAL HIP ARTHROPLASTY Left 08/28/2012   Procedure: Irrigation and Debridement hip ;  Surgeon: Mcarthur Rossetti, MD;  Location: [place];  Service: Orthopedics;  Laterality: Left;   TOTAL KNEE ARTHROPLASTY Left 2001   TOTAL KNEE ARTHROPLASTY Right 2004    FAMILY HISTORY: Family History  Problem Relation Age of Onset   Arthritis Mother    Arthritis Father    Colon cancer Father    Diabetes Sister    Arthritis Sister    Diabetes Maternal Grandmother    Arthritis Maternal Grandmother    Diabetes Maternal Grandfather    Arthritis Maternal Grandfather    Migraines Neg Hx    Sleep apnea Neg Hx     SOCIAL HISTORY: Social History   Socioeconomic History   Marital status: Widowed    Spouse name: Not on file   Number of children: 5   Years of education: PhD   Highest education level: Not on file   Occupational  History   Occupation: Retired  Tobacco Use   Smoking status: Former    Packs/day: 0.12    Years: 2.00    Total pack years: 0.24    Types: Cigarettes   Smokeless tobacco: Never   Tobacco comments:    08/03/2012 "quit 30 years ago"  Vaping Use   Vaping Use: Never used  Substance and Sexual Activity   Alcohol use: No    Alcohol/week: 0.0 standard drinks of alcohol    Comment: 08/03/2012 "used to be a beeralcoholic"; stopped ~ 30 yr ago"   Drug use: No   Sexual activity: Never  Other Topics Concern   Not on file  Social History Narrative   Lives at home with her mother and caregiver.- mother past away 01/2021 and now lives alone.   Occasional use of caffeine.   Right-handed.   Social Determinants of Health   Financial Resource Strain: Low Risk  (11/26/2021)   Overall Financial Resource Strain (CARDIA)    Difficulty of Paying Living Expenses: Not hard at all  Food Insecurity: No Food Insecurity (11/26/2021)   Hunger Vital Sign    Worried About Running Out of Food in the Last Year: Never true    Ran Out of Food in the Last Year: Never true  Transportation Needs: No Transportation Needs (11/26/2021)   PRAPARE - Hydrologist (Medical): No    Lack of Transportation (Non-Medical): No  Physical Activity: Insufficiently Active (11/26/2021)   Exercise Vital Sign    Days of Exercise per Week: 3 days    Minutes of Exercise per Session: 30 min  Stress: No Stress Concern Present (11/26/2021)   [place]    Feeling of Stress : Not at all  Social Connections: Moderately Isolated (11/26/2021)   Social Connection and Isolation Panel [NHANES]    Frequency of Communication with Friends and Family: More than three times a week    Frequency of Social Gatherings with Friends and Family: More than three times a week    Attends Religious Services: More than 4 times per year    Active Member of  [employer] or Organizations: No    Attends Archivist Meetings: Never    Marital Status: Widowed  Intimate Partner Violence: Not At Risk (11/26/2021)   Humiliation, Afraid, Rape, and Kick questionnaire    Fear of Current or Ex-Partner: No    Emotionally Abused: No    Physically Abused: No    Sexually Abused: No   PHYSICAL EXAM  Vitals:   01/28/22 0805  BP: (!) 144/80  Pulse: 75  Weight: 274 lb 9.6 oz (124.6 kg)  Height: '4\' 9"'$  (1.448 m)   Body mass index is 59.42 kg/m.  Generalized: Well developed, in no acute distress, is obese Neurological examination  Mentation: Alert oriented to time, place, history taking. Follows all commands speech and language fluent Cranial nerve II-XII: Pupils were equal round reactive to light. Extraocular movements were full, visual field were full on confrontational test. Facial sensation and strength were normal. Head turning and shoulder shrug  were normal and symmetric. Motor: Strength is intact in all extremities Sensory: Sensory testing is intact to soft touch on all 4 extremities. No evidence of extinction is noted.  Coordination: Cerebellar testing reveals good finger-nose-finger and heel-to-shin bilaterally.  Gait and station: Gait is wide-based, using walker  DIAGNOSTIC DATA (LABS, IMAGING, TESTING) - I reviewed patient records, labs, notes, testing and imaging myself where  available.  Lab Results  Component Value Date   WBC 5.6 08/25/2021   HGB 11.9 (L) 08/25/2021   HCT 38.3 08/25/2021   MCV 92.1 08/25/2021   PLT 209 08/25/2021      Component Value Date/Time   NA 140 01/01/2022 1113   NA 142 07/14/2014 1014   K 4.3 01/01/2022 1113   CL 105 01/01/2022 1113   CO2 26 01/01/2022 1113   GLUCOSE 73 01/01/2022 1113   BUN 12 01/01/2022 1113   BUN 12 07/14/2014 1014   CREATININE 1.00 01/01/2022 1113   CREATININE 1.39 (H) 04/09/2018 1549   CALCIUM 8.7 01/01/2022 1113   PROT 6.9 08/25/2021 0740   PROT 6.9 07/14/2014 1014    ALBUMIN 3.8 01/01/2022 1113   ALBUMIN 4.3 07/14/2014 1014   AST 31 08/25/2021 0740   ALT 10 08/25/2021 0740   ALKPHOS 75 08/25/2021 0740   BILITOT 0.9 08/25/2021 0740   BILITOT 0.2 07/14/2014 1014   GFRNONAA 45 (L) 08/25/2021 0740   GFRAA 46 (L) 03/07/2019 2115   Lab Results  Component Value Date   CHOL 135 01/01/2022   HDL 59.60 01/01/2022   LDLCALC 61 01/01/2022   TRIG 73.0 01/01/2022   CHOLHDL 2 01/01/2022   Lab Results  Component Value Date   HGBA1C 5.7 (A) 10/01/2021   Lab Results  Component Value Date   XNTZGYFV49 449 08/21/2020   Lab Results  Component Value Date   TSH 1.59 04/05/2021   ASSESSMENT AND PLAN 76 y.o. year old female  has a past medical history of Acute nontraumatic kidney injury ([place]) (05/05/2013), Acute posthemorrhagic anemia (09/06/2012), Anemia, Arthritis, Asthma, Chest pain (03/30/2016), Chronic lower back pain, COPD (chronic obstructive pulmonary disease) ([place]), Degenerative arthritis, Fx ankle, GERD (gastroesophageal reflux disease), Gout (05/05/2013), Hypertension, Migraines, Myalgia and myositis, Myocardial infarction ([place]) (1992), Obesity, Osteoporosis, Shortness of breath, Sleep apnea, and Type II diabetes mellitus ([place]). here with:  1.  Migraine headache  -Stable -Nurtec for abortive therapy  2. Stabbing headache  - will recheck CRP.SED rate, patient reports this type of headache is new but she did report similar symptoms in 07/2020.  - Headache could also be due to inconsistent use of CPAP.  3.  OSA on CPAP   -- unable to get report today --Encouraged patient to continue using nightly and greater than 4 hours each night -- Repeat HST to get new machine    Follow-up in 6 months or sooner if needed  Ward Givens, AGNP-C 01/28/2022, 8:11 AM [facility] [address], [place], [place] [address] [phone]

## 2022-01-29 ENCOUNTER — Encounter: Payer: Self-pay | Admitting: Nurse Practitioner

## 2022-01-29 ENCOUNTER — Ambulatory Visit (INDEPENDENT_AMBULATORY_CARE_PROVIDER_SITE_OTHER): Payer: Medicare Other | Admitting: Nurse Practitioner

## 2022-01-29 VITALS — BP 140/82 | HR 57 | Temp 95.8°F | Ht <= 58 in | Wt 270.2 lb

## 2022-01-29 DIAGNOSIS — I10 Essential (primary) hypertension: Secondary | ICD-10-CM

## 2022-01-29 DIAGNOSIS — N1832 Chronic kidney disease, stage 3b: Secondary | ICD-10-CM

## 2022-01-29 DIAGNOSIS — E1142 Type 2 diabetes mellitus with diabetic polyneuropathy: Secondary | ICD-10-CM | POA: Diagnosis not present

## 2022-01-29 DIAGNOSIS — R600 Localized edema: Secondary | ICD-10-CM

## 2022-01-29 LAB — C-REACTIVE PROTEIN: CRP: 14 mg/L — ABNORMAL HIGH (ref 0–10)

## 2022-01-29 LAB — BASIC METABOLIC PANEL
BUN: 16 mg/dL (ref 6–23)
CO2: 33 mEq/L — ABNORMAL HIGH (ref 19–32)
Calcium: 8.8 mg/dL (ref 8.4–10.5)
Chloride: 102 mEq/L (ref 96–112)
Creatinine, Ser: 1.2 mg/dL (ref 0.40–1.20)
GFR: 44.55 mL/min — ABNORMAL LOW (ref >60.00)
Glucose, Bld: 65 mg/dL — ABNORMAL LOW (ref 70–99)
Potassium: 4.1 mEq/L (ref 3.5–5.1)
Sodium: 142 mEq/L (ref 135–145)

## 2022-01-29 LAB — HEMOGLOBIN A1C: Hgb A1c MFr Bld: 5.8 % (ref 4.6–6.5)

## 2022-01-29 LAB — SEDIMENTATION RATE: Sed Rate: 17 mm/hr (ref 0–40)

## 2022-01-29 MED ORDER — LISINOPRIL 40 MG PO TABS: 40.0000 mg | ORAL_TABLET | Freq: Every day | ORAL | 1 refills | Status: DC

## 2022-01-29 MED ORDER — FUROSEMIDE 20 MG PO TABS: 10.0000 mg | ORAL_TABLET | Freq: Every day | ORAL | 1 refills | Status: DC

## 2022-01-29 NOTE — Progress Notes (Signed)
Established Patient Visit  Patient: Sandra Brennan   DOB: [birth year]   76 y.o. Female  MRN: [id number] Visit Date: 01/29/2022  Subjective:    Chief Complaint  Patient presents with  . Office Visit    HTN/ Edema Doesn't check BP  Says swelling is getting better but it still comes most days, painful    HPI Hypertension Improved but not at goal Improved LE edema Reports need for new CPAP machine. Has been ordered by neurologist. BP Readings from Last 3 Encounters:  01/29/22 (!) 140/82  01/28/22 (!) 144/80  01/15/22 139/79    Repeat BMP Increase lisinopril to '40mg'$  Maintain furosemide dose F/up in 29month Wt Readings from Last 3 Encounters:  01/29/22 270 lb 3.2 oz (122.6 kg)  01/28/22 274 lb 9.6 oz (124.6 kg)  01/15/22 268 lb (121.6 kg)     Reviewed medical, surgical, and social history today  Medications: Outpatient Medications Prior to Visit  Medication Sig  . albuterol (VENTOLIN HFA) 108 (90 Base) MCG/ACT inhaler INHALE 1 PUFF INTO THE LUNGS EVERY 6 (SIX) HOURS AS NEEDED FOR SHORTNESS OF BREATH OR WHEEZING.  .Marland Kitchenallopurinol (ZYLOPRIM) 100 MG tablet Take 1 tablet (100 mg total) by mouth daily.  .Marland Kitchenaspirin EC 81 MG tablet Take 81 mg by mouth daily.  .Marland Kitchenatorvastatin (LIPITOR) 10 MG tablet TAKE 1 TABLET BY MOUTH  DAILY AT 6 PM.  . Carboxymethylcellulose Sodium (EYE DROPS OP) Apply to eye. Lubricant eye drops  . cetirizine (ZYRTEC) 10 MG tablet Take 10 mg by mouth daily as needed.  . cholecalciferol (VITAMIN D3) 25 MCG (1000 UNIT) tablet Take 1,000 Units by mouth daily.  .Marland Kitchendextromethorphan-guaiFENesin (MUCINEX DM) 30-600 MG 12hr tablet Take 1 tablet by mouth 2 (two) times daily.  . Multiple Vitamin (MULTIVITAMIN) tablet Take 1 tablet by mouth daily.  .Marland Kitchenomeprazole (PRILOSEC) 20 MG capsule TAKE 1 CAPSULE BY MOUTH EVERY DAY  . pregabalin (LYRICA) 150 MG capsule TAKE 1 CAPSULE BY MOUTH TWICE A DAY  . Rimegepant Sulfate (NURTEC) 75 MG TBDP Take 75 mg by mouth as  needed (take 1 at onset of headache, max is 75 in 24 hours).  . Semaglutide,0.25 or 0.'5MG'$ /DOS, (OZEMPIC, 0.25 OR 0.5 MG/DOSE,) 2 MG/3ML SOPN INJECT 0.5 MG INTO THE SKIN ONCE A WEEK.  . Sennosides 8.6 MG CAPS Take 1 capsule by mouth daily.  . SYMBICORT 160-4.5 MCG/ACT inhaler Inhale 2 puffs into the lungs in the morning and at bedtime. Rinse mouth after each use  . traMADol (ULTRAM) 50 MG tablet Take 1 tablet (50 mg total) by mouth 3 (three) times daily.  . [DISCONTINUED] furosemide (LASIX) 20 MG tablet TAKE 1/2 TABLET BY MOUTH DAILY  . [DISCONTINUED] lisinopril (ZESTRIL) 30 MG tablet Take 1 tablet (30 mg total) by mouth daily.  . [DISCONTINUED] FLUoxetine (PROZAC) 10 MG capsule Take 10 mg by mouth daily. (Patient not taking: Reported on 01/28/2022)   No facility-administered medications prior to visit.   Reviewed past medical and social history.   ROS per HPI above      Objective:  BP (!) 140/82   Pulse (!) 57   Temp (!) 95.8 F (35.4 C) (Temporal)   Ht '4\' 9"'$  (1.448 m)   Wt 270 lb 3.2 oz (122.6 kg)   SpO2 90%   BMI 58.47 kg/m      Physical Exam Vitals and nursing note reviewed.  Cardiovascular:  Rate and Rhythm: Normal rate and regular rhythm.     Pulses: Normal pulses.     Heart sounds: Normal heart sounds.  Pulmonary:     Effort: Pulmonary effort is normal.     Breath sounds: Normal breath sounds.  Neurological:     Mental Status: She is alert and oriented to person, place, and time.    No results found for any visits on 01/29/22.    Assessment & Plan:    Problem List Items Addressed This Visit       Cardiovascular and Mediastinum   Hypertension (Chronic)    Improved but not at goal Improved LE edema Reports need for new CPAP machine. Has been ordered by neurologist. BP Readings from Last 3 Encounters:  01/29/22 (!) 140/82  01/28/22 (!) 144/80  01/15/22 139/79    Repeat BMP Increase lisinopril to '40mg'$  Maintain furosemide dose F/up in 61month      Relevant Medications   lisinopril (ZESTRIL) 40 MG tablet   furosemide (LASIX) 20 MG tablet   Other Relevant Orders   Basic metabolic panel     Endocrine   Type 2 diabetes mellitus with diabetic polyneuropathy, without long-term current use of insulin (HCC) (Chronic)   Relevant Medications   lisinopril (ZESTRIL) 40 MG tablet   Other Relevant Orders   Hemoglobin A1c     Genitourinary   CKD (chronic kidney disease) stage 3, GFR 30-59 ml/min (HCC) - Primary   Relevant Orders   Basic metabolic panel     Other   Bilateral leg edema   Relevant Medications   furosemide (LASIX) 20 MG tablet   Return in about 4 weeks (around 02/26/2022) for HTN.     CWilfred Lacy NP

## 2022-01-29 NOTE — Patient Instructions (Signed)
Increase lisinopril dose to '40mg'$  daily Maintain furosemide dose Use miralax 17g daily for constipation Go to lab

## 2022-01-29 NOTE — Assessment & Plan Note (Signed)
completed fecal occult: normal  cologuard 12/2021: negative

## 2022-01-29 NOTE — Assessment & Plan Note (Signed)
Improved but not at goal Improved LE edema Reports need for new CPAP machine. Has been ordered by neurologist. BP Readings from Last 3 Encounters:  01/29/22 (!) 140/82  01/28/22 (!) 144/80  01/15/22 139/79    Repeat BMP Increase lisinopril to '40mg'$  Maintain furosemide dose F/up in 48month

## 2022-01-30 ENCOUNTER — Ambulatory Visit: Payer: Self-pay

## 2022-01-31 ENCOUNTER — Other Ambulatory Visit: Payer: Self-pay | Admitting: Nurse Practitioner

## 2022-01-31 DIAGNOSIS — R600 Localized edema: Secondary | ICD-10-CM

## 2022-01-31 NOTE — Patient Instructions (Signed)
Visit Information  Thank you for taking time to visit with me today. Please don't hesitate to contact me if I can be of assistance to you.   Following are the goals we discussed today:   Goals Addressed             This Visit's Progress    I want to manage my blood pressure       Care Coordination Interventions:  Solution-Focused Strategies employed:  Active listening / Reflection utilized  Emotional Support Provided BP Readings from Last 3 Encounters:  01/29/22 (!) 140/82  01/28/22 (!) 144/80  01/15/22 139/79  Evaluation of current treatment plan related to hypertension self management and patient's adherence to plan as established by provider Reviewed medications with patient and discussed importance of compliance Discussed plans with patient for ongoing care management follow up and provided patient with direct contact information for care management team Advised patient, providing education and rationale, to monitor blood pressure daily and record, calling PCP for findings outside established parameters       Patient Stated          Our next appointment is by telephone on 12/14 at 115 pm  Please call the care guide team at [phone] if you need to cancel or reschedule your appointment.   If you are experiencing a Mental Health or [place] or need someone to talk to, please call [phone]-TALK (toll free, 24 hour hotline)  The patient verbalized understanding of instructions, educational materials, and care plan provided today.    Lazaro Arms RN, BSN, [facility]   Phone: [phone]

## 2022-01-31 NOTE — Patient Outreach (Signed)
  Care Coordination   Initial Visit Note   01/30/2022 Name: Sandra Brennan MRN: [id number] DOB: [birth year]  Sandra Brennan is a 76 y.o. year old female who sees Nche, Charlene Brooke, NP for primary care. I spoke with  Sandra Brennan by phone today.  What matters to the patients health and wellness today?  Sandra Brennan, who lives alone, has informed me that she experiences balance issues, and therefore, she relies on a cane to aid her mobility. Her last A1c reading was recorded at 5.7, and her blood sugar levels remain under control. However, her blood pressure readings are slightly elevated, and we discussed the importance of following a healthy diet and identifying signs and symptoms. I will send her some relevant information on managing blood pressure to read and educate herself further.    Goals Addressed             This Visit's Progress    I want to manage my blood pressure       Care Coordination Interventions:  Solution-Focused Strategies employed:  Active listening / Reflection utilized  Emotional Support Provided BP Readings from Last 3 Encounters:  01/29/22 (!) 140/82  01/28/22 (!) 144/80  01/15/22 139/79  Evaluation of current treatment plan related to hypertension self management and patient's adherence to plan as established by provider Reviewed medications with patient and discussed importance of compliance Discussed plans with patient for ongoing care management follow up and provided patient with direct contact information for care management team Advised patient, providing education and rationale, to monitor blood pressure daily and record, calling PCP for findings outside established parameters       Patient Stated          SDOH assessments and interventions completed:  Yes  SDOH Interventions Today    Flowsheet Row Most Recent Value  SDOH Interventions   Food Insecurity Interventions Intervention Not Indicated  [Care guides]  Transportation  Interventions Intervention Not Indicated  [patient is going to contact her Insurance account manager and insurance]        Care Coordination Interventions:  Yes, provided   Follow up plan: Follow up call scheduled for 02/13/22 115 pm    Encounter Outcome:  Pt. Visit Completed   Lazaro Arms RN, BSN, [facility]   Phone: [phone]

## 2022-02-03 ENCOUNTER — Telehealth: Payer: Self-pay | Admitting: *Deleted

## 2022-02-03 NOTE — Telephone Encounter (Signed)
Pt returned phone call  and would like a call back.

## 2022-02-03 NOTE — Telephone Encounter (Signed)
Called and could not LM for pt (full VM)

## 2022-02-03 NOTE — Telephone Encounter (Signed)
-----   Message from Ward Givens, NP sent at 01/29/2022  3:22 PM EST ----- CRP and sed rate consistent with previous lab work. No concern for temporal arteritis.

## 2022-02-04 ENCOUNTER — Telehealth: Payer: Self-pay | Admitting: Adult Health

## 2022-02-04 NOTE — Telephone Encounter (Signed)
Spoke with patient and relayed lab result message from Dix NP that CRP and sed rate are consistent with previous lab work. No concern for temporal arteritis. Patient verbalized understanding and appreciation. She did not have any questions at this time.

## 2022-02-04 NOTE — Telephone Encounter (Signed)
HST- [employer] medicare/medicaid no auth req.  Patient is scheduled at [facility] for 02/25/22 at 10:30 AM.  Mailed packet to the patient.

## 2022-02-13 ENCOUNTER — Encounter: Payer: Medicare Other | Attending: Nurse Practitioner | Admitting: Dietician

## 2022-02-13 ENCOUNTER — Encounter: Payer: Self-pay | Admitting: Dietician

## 2022-02-13 ENCOUNTER — Ambulatory Visit: Payer: Self-pay

## 2022-02-13 DIAGNOSIS — E1142 Type 2 diabetes mellitus with diabetic polyneuropathy: Secondary | ICD-10-CM | POA: Diagnosis not present

## 2022-02-13 NOTE — Patient Outreach (Signed)
  Care Coordination   Follow Up Visit Note   02/13/2022 Name: Sandra Brennan MRN: [id number] DOB: 12/24/1945  [name] Colbaugh is a 76 y.o. year old female who sees Nche, Charlene Brooke, NP for primary care. I spoke with  [name] Bentsen by phone today.  What matters to the patients health and wellness today?  Mrs. Gorman is still experiencing migraines and is unsure of the cause. However, she is doing fair overall. On 02/25/22, she is scheduled to have a sleep study to help diagnose the issue. Unfortunately, she has been unable to monitor her blood pressure at home as she no longer has a monitor. She believes it may have been taken by mistake. Her last reading was 140/82. Mrs. Clippinger reports experiencing some chest discomfort, but using her inhalers seems to help. She has an upcoming appointment with Dr. Ludwig Clarks on 03/05/22 at 8:30 am. We did discuss the protocol if she has any chest discomfort that increases in intensity does not go away.     Goals Addressed             This Visit's Progress    I want to manage my blood pressure       Care Coordination Interventions: Solution-Focused Strategies employed:  Active listening / Reflection utilized  Emotional Support Provided BP Readings from Last 3 Encounters:  01/29/22 (!) 140/82  01/28/22 (!) 144/80  01/15/22 139/79  Evaluation of current treatment plan related to hypertension self management and patient's adherence to plan as established by provider Reviewed medications with patient and discussed importance of compliance Discussed plans with patient for ongoing care management follow up and provided patient with direct contact information for care management team Advised patient, providing education and rationale, to monitor blood pressure daily and record, calling PCP for findings outside established parameters          SDOH assessments and interventions completed:  No     Care Coordination Interventions:  Yes, provided    Follow up plan: Follow up call scheduled for 03/19/22 130 pm    Encounter Outcome:  Pt. Visit Completed   Lazaro Arms RN, BSN, [facility]   Phone: [phone]

## 2022-02-13 NOTE — Progress Notes (Signed)
Diabetes Self-Management Education  Visit Type: Follow-up  Appt. Start Time: 1150 Appt. End Time: 5427  02/14/2022  Sandra Brennan, identified by name and date of birth, is a 76 y.o. female with a diagnosis of Diabetes:  .   ASSESSMENT This was a phone visit.  Last visit with this RD was 12/27/2021. Patient was at home and I was in my office.  She states that she is now trying to eat more consistently. Weight has decreased 3 lbs in the past month per chart. Discussed that current diet is not adequate and is high in sodium/fat/carbs at times.  Recommendations discussed.  Medical Hx: HTN, HLD, Type 2 DM, Depression, GERD, Fibromyalgia, Anemia, depression, polyneuropathy, OSA on c-pap Medications: Prilosec, ozempic, vitamin D, MVI Labs: A1c - 5.8% 01/29/2022, 5.7% 10/01/21, 6.4, GFR - 46.52 (low) Notable Signs/Symptoms: Lower leg edema, SOB   Weight hx: 59" 270 lbs 12/29/2021 273 lbs 12/27/2021 279 lbs 11/29/2021 260 lbs 08/27/2021 299 lbs 08/2020 198 lbs 2021.  Lost while doing water aerobics. 333 lbs 2020   Patient keeps her grandson weekdays while he is in school. He is 74 yo. Hasn't cooked much since her mother died.  Her grandson only eats chicken nuggets, fries, and [place] sausage.  She is working on increasing his acceptance.  She enjoys vegetables but frequently eats crackers, chips or other easy snacks that are high in sodium. Dentures don't fit.  Can eat salad without dentures. Allergy:  pineapple which causes shortness of breath Walks with a walker or cane..  Unable to raise her arms above her head.  Likes water aerobics and is working on transportation. Works with Warehouse manager.   Her oldest daughter would like for her to move in with her.  She does not wish to at this time as she feels that she needs to help with her youngest daughter's child.   Diabetes Self-Management Education - 02/14/22 1700       Visit Information   Visit Type Follow-up       Psychosocial Assessment   Patient Belief/Attitude about Diabetes Motivated to manage diabetes    What is the hardest part about your diabetes right now, causing you the most concern, or is the most worrisome to you about your diabetes?   Making healty food and beverage choices;Being active    Self-care barriers None    Other persons present Patient      Pre-Education Assessment   Patient understands the diabetes disease and treatment process. Comprehends key points    Patient understands incorporating nutritional management into lifestyle. Needs Review    Patient undertands incorporating physical activity into lifestyle. Comprehends key points    Patient understands using medications safely. Comprehends key points    Patient understands monitoring blood glucose, interpreting and using results Comprehends key points    Patient understands prevention, detection, and treatment of acute complications. Comprehends key points    Patient understands prevention, detection, and treatment of chronic complications. Compreheands key points    Patient understands how to develop strategies to address psychosocial issues. Comprehends key points    Patient understands how to develop strategies to promote health/change behavior. Needs Review      Complications   Last HgB A1C per patient/outside source 5.8 %   01/29/2022     Dietary Intake   Breakfast OJ, sausage McGriddle    Lunch skipped    Dinner potatoes and broccoli or skips    Snack (evening) rizt crackers    Beverage(s) water,  juice, lactaid milk      Activity / Exercise   Activity / Exercise Type ADL's      Patient Education   Previous Diabetes Education Yes (please comment)    Healthy Eating Role of diet in the treatment of diabetes and the relationship between the three main macronutrients and blood glucose level;Meal options for control of blood glucose level and chronic complications.;Food label reading, portion sizes and measuring food.     Being Active Helped patient identify appropriate exercises in relation to his/her diabetes, diabetes complications and other health issue.    Diabetes Stress and Support Worked with patient to identify barriers to care and solutions;Identified and addressed patients feelings and concerns about diabetes      Individualized Goals (developed by patient)   Nutrition General guidelines for healthy choices and portions discussed    Physical Activity Exercise 3-5 times per week;15 minutes per day    Problem Solving Eating Pattern      Patient Self-Evaluation of Goals - Patient rates self as meeting previously set goals (% of time)   Nutrition 50 - 75 % (half of the time)    Physical Activity < 25% (hardly ever/never)    Medications >75% (most of the time)    Monitoring Not Applicable    Problem Solving and behavior change strategies  25 - 50% (sometimes)    Reducing Risk (treating acute and chronic complications) 25 - 87% (sometimes)    Health Coping 50 - 75 % (half of the time)      Post-Education Assessment   Patient understands the diabetes disease and treatment process. Demonstrates understanding / competency    Patient understands incorporating nutritional management into lifestyle. Needs Review    Patient undertands incorporating physical activity into lifestyle. Demonstrates understanding / competency    Patient understands using medications safely. Demonstrates understanding / competency    Patient understands monitoring blood glucose, interpreting and using results Demonstrates understanding / competency    Patient understands prevention, detection, and treatment of acute complications. Demonstrates understanding / competency    Patient understands prevention, detection, and treatment of chronic complications. Demonstrates understanding / competency    Patient understands how to develop strategies to address psychosocial issues. Comprehends key points    Patient understands how to  develop strategies to promote health/change behavior. Needs Review      Outcomes   Expected Outcomes Demonstrated interest in learning. Expect positive outcomes    Future DMSE 2 months    Program Status Not Completed      Subsequent Visit   Since your last visit have you continued or begun to take your medications as prescribed? Yes    Since your last visit have you experienced any weight changes? Loss    Weight Loss (lbs) 3             Individualized Plan for Diabetes Self-Management Training:   Learning Objective:  Patient will have a greater understanding of diabetes self-management. Patient education plan is to attend individual and/or group sessions per assessed needs and concerns.   Plan:   Patient Instructions  Be cautious about food that comes from restaurants.  These are often high in sodium. Continue to avoid salt in your cooking. Choose balanced meals and avoid skipping meals  Serve meals on a salad (smaller) plate. Simple meals ideas:  Oatmeal (steal cut oats reheat very well), fruit, unsalted nuts  Lite yogurt, fresh or unsweetened fruit  1/2 sandwich and raw vegetables and fruit  1 egg,  1 slice Wheat toast, fresh fruit  1 slice Wheat toast, swiss cheese, fresh fruit  "When I am stressed, I can work on puzzles, pray or study rather than eat."  Expected Outcomes:  Demonstrated interest in learning. Expect positive outcomes  Education material provided:   If problems or questions, patient to contact team via:  Phone  Future DSME appointment: 2 months

## 2022-02-13 NOTE — Patient Instructions (Signed)
Be cautious about food that comes from restaurants.  These are often high in sodium. Continue to avoid salt in your cooking. Choose balanced meals and avoid skipping meals  Serve meals on a salad (smaller) plate. Simple meals ideas:  Oatmeal (steal cut oats reheat very well), fruit, unsalted nuts  Lite yogurt, fresh or unsweetened fruit  1/2 sandwich and raw vegetables and fruit  1 egg, 1 slice Wheat toast, fresh fruit  1 slice Wheat toast, swiss cheese, fresh fruit  "When I am stressed, I can work on puzzles, pray or study rather than eat."

## 2022-02-13 NOTE — Patient Instructions (Addendum)
Visit Information  Thank you for taking time to visit with me today. Please don't hesitate to contact me if I can be of assistance to you.   Following are the goals we discussed today:   Goals Addressed             This Visit's Progress    I want to manage my blood pressure       Care Coordination Interventions: Solution-Focused Strategies employed:  Active listening / Reflection utilized  Emotional Support Provided BP Readings from Last 3 Encounters:  01/29/22 (!) 140/82  01/28/22 (!) 144/80  01/15/22 139/79  Evaluation of current treatment plan related to hypertension self management and patient's adherence to plan as established by provider Reviewed medications with patient and discussed importance of compliance Discussed plans with patient for ongoing care management follow up and provided patient with direct contact information for care management team Advised patient, providing education and rationale, to monitor blood pressure daily and record, calling PCP for findings outside established parameters          Our next appointment is by telephone on 03/19/22 at 130 pm  Please call the care guide team at [phone] if you need to cancel or reschedule your appointment.   If you are experiencing a Mental Health or [place] or need someone to talk to, please call 1-800-273-TALK (toll free, 24 hour hotline)  The patient verbalized understanding of instructions, educational materials, and care plan provided today.   Lazaro Arms RN, BSN, [facility]   Phone: [phone]

## 2022-02-21 ENCOUNTER — Other Ambulatory Visit: Payer: Self-pay | Admitting: Nurse Practitioner

## 2022-02-21 DIAGNOSIS — K2100 Gastro-esophageal reflux disease with esophagitis, without bleeding: Secondary | ICD-10-CM

## 2022-02-25 ENCOUNTER — Ambulatory Visit (INDEPENDENT_AMBULATORY_CARE_PROVIDER_SITE_OTHER): Payer: Medicare Other | Admitting: Neurology

## 2022-02-25 DIAGNOSIS — G4733 Obstructive sleep apnea (adult) (pediatric): Secondary | ICD-10-CM | POA: Diagnosis not present

## 2022-02-26 NOTE — Progress Notes (Unsigned)
[facility]  HOME SLEEP TEST (Watch PAT) REPORT  STUDY DATE: 02/25/2022  DOB: Feb 11, 1946  MRN: [id number]  ORDERING CLINICIAN: Huston Foley, MD, PhD   REFERRING CLINICIAN: Nche, Bonna Gains, NP (PCP), Butch Penny (Sleep), Dr. Terrace Arabia (Neuro)  CLINICAL INFORMATION/HISTORY: 76 year old female with an underlying medical history of hypertension, anemia, arthritis, asthma, COPD, reflux disease, gout, migraine headaches, diabetes, and morbid obesity with a BMI of over 50, who presents for reevaluation of her obstructive sleep apnea.  She has an older CPAP machine.  She should be eligible for new equipment.  FINDINGS:   Sleep Summary:   Total Recording Time (hours, min): 8 hours, 20 min  Total Sleep Time (hours, min):  7 hours, 12 min  Percent REM (%):    22.6%   Respiratory Indices:   Calculated pAHI (per hour):  38.6/hour         REM pAHI:    50.6/hour       NREM pAHI: 36/hour  Central pAHI: 0.8/hour  Oxygen Saturation Statistics:    Oxygen Saturation (%) Mean: 93%   Minimum oxygen saturation (%):                 78%   O2 Saturation Range (%): 78-99%    O2 Saturation (minutes) <=88%: 3.9 min  Pulse Rate Statistics:   Pulse Mean (bpm):    75/min    Pulse Range (63-91/min)   IMPRESSION: OSA (obstructive sleep apnea), severe  RECOMMENDATION:  This home sleep test demonstrates severe obstructive sleep apnea with a total AHI of ***/hour and O2 nadir of ***%.  Snoring was detected, in the ***range.  Treatment with positive airway pressure is highly recommended. This will require - ideally - a full night CPAP titration study for proper treatment settings, O2 monitoring and mask fitting. For now, the patient will be advised to proceed with an autoPAP titration/trial at home. A laboratory attended titration study can be considered in the future for optimization of treatment settings and to improve tolerance and compliance. Alternative treatment options  are limited secondary to the severity of the patient's sleep disordered breathing, but may include surgical treatment with an implantable hypoglossal nerve stimulator (in carefully selected candidates, meeting criteria).  Concomitant weight loss is recommended, where clinically appropriate. Please note, that untreated obstructive sleep apnea may carry additional perioperative morbidity. Patients with significant obstructive sleep apnea should receive perioperative PAP therapy and the surgeons and particularly the anesthesiologist should be informed of the diagnosis and the severity of the sleep disordered breathing. The patient should be cautioned not to drive, work at heights, or operate dangerous or heavy equipment when tired or sleepy. Review and reiteration of good sleep hygiene measures should be pursued with any patient. Other causes of the patient's symptoms, including circadian rhythm disturbances, an underlying mood disorder, medication effect and/or an underlying medical problem cannot be ruled out based on this test. Clinical correlation is recommended.  The patient and *** referring provider will be notified of the test results. The patient will be seen in follow up in sleep clinic at [facility].  I certify that I have reviewed the raw data recording prior to the issuance of this report in accordance with the standards of the American Academy of Sleep Medicine (AASM).    INTERPRETING PHYSICIAN:   Huston Foley, MD, PhD Medical Director, [facility]) Diplomat, ABPN (Neurology and Sleep)   [facility] [address] [place], [place] [address] [phone]

## 2022-03-05 ENCOUNTER — Telehealth: Payer: Self-pay

## 2022-03-05 ENCOUNTER — Encounter: Payer: Self-pay | Admitting: Nurse Practitioner

## 2022-03-05 ENCOUNTER — Ambulatory Visit (INDEPENDENT_AMBULATORY_CARE_PROVIDER_SITE_OTHER): Payer: Medicare Other | Admitting: Nurse Practitioner

## 2022-03-05 VITALS — BP 138/76 | HR 71 | Temp 97.7°F | Ht <= 58 in | Wt 270.2 lb

## 2022-03-05 DIAGNOSIS — F339 Major depressive disorder, recurrent, unspecified: Secondary | ICD-10-CM | POA: Diagnosis not present

## 2022-03-05 DIAGNOSIS — I10 Essential (primary) hypertension: Secondary | ICD-10-CM

## 2022-03-05 DIAGNOSIS — N1832 Chronic kidney disease, stage 3b: Secondary | ICD-10-CM

## 2022-03-05 DIAGNOSIS — E1142 Type 2 diabetes mellitus with diabetic polyneuropathy: Secondary | ICD-10-CM | POA: Diagnosis not present

## 2022-03-05 LAB — RENAL FUNCTION PANEL
Albumin: 3.9 g/dL (ref 3.5–5.2)
BUN: 14 mg/dL (ref 6–23)
CO2: 27 mEq/L (ref 19–32)
Calcium: 8.9 mg/dL (ref 8.4–10.5)
Chloride: 105 mEq/L (ref 96–112)
Creatinine, Ser: 0.99 mg/dL (ref 0.40–1.20)
GFR: 56.08 mL/min — ABNORMAL LOW (ref >60.00)
Glucose, Bld: 81 mg/dL (ref 70–99)
Phosphorus: 3.5 mg/dL (ref 2.3–4.6)
Potassium: 4 mEq/L (ref 3.5–5.1)
Sodium: 141 mEq/L (ref 135–145)

## 2022-03-05 NOTE — Patient Instructions (Signed)
Maintain current medications Go to lab

## 2022-03-05 NOTE — Assessment & Plan Note (Signed)
Stable with use of lyrica hgbA1c at 5.8% LE edema controlled with furosemide EOD. No ulcer present

## 2022-03-05 NOTE — Procedures (Signed)
[facility]  HOME SLEEP TEST (Watch PAT) REPORT  STUDY DATE: 02/25/2022  DOB: 1945/06/06  MRN: [id number]  ORDERING CLINICIAN: Star Age, MD, PhD   REFERRING CLINICIAN: Nche, Charlene Brooke, NP (PCP), Ward Givens (Sleep), Dr. Krista Blue (Neuro)  CLINICAL INFORMATION/HISTORY: 77 year old female with an underlying medical history of hypertension, anemia, arthritis, asthma, COPD, reflux disease, gout, migraine headaches, diabetes, and morbid obesity with a BMI of over 58, who presents for reevaluation of her obstructive sleep apnea.  She has an older CPAP machine.  She should be eligible for new equipment.  FINDINGS:   Sleep Summary:   Total Recording Time (hours, min): 8 hours, 20 min  Total Sleep Time (hours, min):  7 hours, 12 min  Percent REM (%):    22.6%   Respiratory Indices:   Calculated pAHI (per hour):  38.6/hour         REM pAHI:    50.6/hour       NREM pAHI: 36/hour  Central pAHI: 0.8/hour  Oxygen Saturation Statistics:    Oxygen Saturation (%) Mean: 93%   Minimum oxygen saturation (%):                 78%   O2 Saturation Range (%): 78-99%    O2 Saturation (minutes) <=88%: 3.9 min  Pulse Rate Statistics:   Pulse Mean (bpm):    75/min    Pulse Range (63-91/min)   IMPRESSION: OSA (obstructive sleep apnea), severe  RECOMMENDATION:  This home sleep test demonstrates severe obstructive sleep apnea with a total AHI of 38.6/hour and O2 nadir of 78%.  Snoring was detected, in the mild to loud range.  Ongoing treatment with positive airway pressure is highly recommended.  I recommend a new AutoPap machine for home use.  Pressure range of 5 to 10 cm should be adequate in treating her sleep apnea, she has required a low set pressure in the past. A laboratory attended titration study can be considered in the future for optimization of treatment settings and to improve tolerance and compliance. Alternative treatment options are limited secondary to  the severity of the patient's sleep disordered breathing, but may include surgical treatment with an implantable hypoglossal nerve stimulator (in carefully selected candidates, meeting criteria).  Concomitant weight loss is recommended, where clinically appropriate. Please note, that untreated obstructive sleep apnea may carry additional perioperative morbidity. Patients with significant obstructive sleep apnea should receive perioperative PAP therapy and the surgeons and particularly the anesthesiologist should be informed of the diagnosis and the severity of the sleep disordered breathing. The patient should be cautioned not to drive, work at heights, or operate dangerous or heavy equipment when tired or sleepy. Review and reiteration of good sleep hygiene measures should be pursued with any patient. Other causes of the patient's symptoms, including circadian rhythm disturbances, an underlying mood disorder, medication effect and/or an underlying medical problem cannot be ruled out based on this test. Clinical correlation is recommended.  The patient and her referring provider will be notified of the test results. The patient will be seen in follow up in sleep clinic at [facility].  I certify that I have reviewed the raw data recording prior to the issuance of this report in accordance with the standards of the American Academy of Sleep Medicine (AASM).  INTERPRETING PHYSICIAN:   Star Age, MD, PhD Medical Director, [facility]) [place], ABPN (Neurology and Sleep)   [facility] [address], [place], [place] [address] [phone]                   

## 2022-03-05 NOTE — Assessment & Plan Note (Addendum)
Weight loss with use of ozempic weekly. Encourage to do chair exercise at home and/or join water aerobics class Wt Readings from Last 3 Encounters:  03/05/22 270 lb 3.2 oz (122.6 kg)  01/29/22 270 lb 3.2 oz (122.6 kg)  01/28/22 274 lb 9.6 oz (124.6 kg)

## 2022-03-05 NOTE — Assessment & Plan Note (Signed)
Stable mood Denies need for medications or referral to therapist

## 2022-03-05 NOTE — Assessment & Plan Note (Signed)
BP at goal with lisinopril and furosemide BP Readings from Last 3 Encounters:  03/05/22 138/76  01/29/22 (!) 140/82  01/28/22 (!) 144/80    Maintain med dose Repeat BMP F/up in 70month

## 2022-03-05 NOTE — Progress Notes (Signed)
Established Patient Visit  Patient: Sandra Brennan   DOB: [birth year]   77 y.o. Female  MRN: [id number] Visit Date: 03/05/2022  Subjective:    Chief Complaint  Patient presents with   Office Visit    HTN Doesn't check BP  C/o intermittent sharp pain on right side of head    HPI Hypertension BP at goal with lisinopril and furosemide BP Readings from Last 3 Encounters:  03/05/22 138/76  01/29/22 (!) 140/82  01/28/22 (!) 144/80    Maintain med dose Repeat BMP F/up in 53month  Depression, recurrent (HCC) Stable mood Denies need for medications or referral to therapist  Type 2 diabetes mellitus with diabetic polyneuropathy, without long-term current use of insulin (HCC) Stable with use of lyrica hgbA1c at 5.8% LE edema controlled with furosemide EOD. No ulcer present  Morbid obesity (HCC) Weight loss with use of ozempic weekly. Encourage to do chair exercise at home and/or join water aerobics class Wt Readings from Last 3 Encounters:  03/05/22 270 lb 3.2 oz (122.6 kg)  01/29/22 270 lb 3.2 oz (122.6 kg)  01/28/22 274 lb 9.6 oz (124.6 kg)     Reviewed medical, surgical, and social history today  Medications: Outpatient Medications Prior to Visit  Medication Sig   albuterol (VENTOLIN HFA) 108 (90 Base) MCG/ACT inhaler INHALE 1 PUFF INTO THE LUNGS EVERY 6 (SIX) HOURS AS NEEDED FOR SHORTNESS OF BREATH OR WHEEZING.   allopurinol (ZYLOPRIM) 100 MG tablet Take 1 tablet (100 mg total) by mouth daily.   aspirin EC 81 MG tablet Take 81 mg by mouth daily.   atorvastatin (LIPITOR) 10 MG tablet TAKE 1 TABLET BY MOUTH  DAILY AT 6 PM.   Carboxymethylcellulose Sodium (EYE DROPS OP) Apply to eye. Lubricant eye drops   cetirizine (ZYRTEC) 10 MG tablet Take 10 mg by mouth daily as needed.   cholecalciferol (VITAMIN D3) 25 MCG (1000 UNIT) tablet Take 1,000 Units by mouth daily.   dextromethorphan-guaiFENesin (MUCINEX DM) 30-600 MG 12hr tablet Take 1 tablet by mouth 2 (two)  times daily.   furosemide (LASIX) 20 MG tablet Take 0.5 tablets (10 mg total) by mouth every other day.   lisinopril (ZESTRIL) 40 MG tablet Take 1 tablet (40 mg total) by mouth daily.   Multiple Vitamin (MULTIVITAMIN) tablet Take 1 tablet by mouth daily.   omeprazole (PRILOSEC) 20 MG capsule TAKE 1 CAPSULE BY MOUTH EVERY DAY   pregabalin (LYRICA) 150 MG capsule TAKE 1 CAPSULE BY MOUTH TWICE A DAY   Rimegepant Sulfate (NURTEC) 75 MG TBDP Take 75 mg by mouth as needed (take 1 at onset of headache, max is 75 in 24 hours).   Sennosides 8.6 MG CAPS Take 1 capsule by mouth daily.   SYMBICORT 160-4.5 MCG/ACT inhaler Inhale 2 puffs into the lungs in the morning and at bedtime. Rinse mouth after each use   traMADol (ULTRAM) 50 MG tablet Take 1 tablet (50 mg total) by mouth 3 (three) times daily.   Semaglutide,0.25 or 0.'5MG'$ /DOS, (OZEMPIC, 0.25 OR 0.5 MG/DOSE,) 2 MG/3ML SOPN INJECT 0.5 MG INTO THE SKIN ONCE A WEEK. (Patient not taking: Reported on 03/05/2022)   No facility-administered medications prior to visit.   Reviewed past medical and social history.   ROS per HPI above      Objective:  BP 138/76   Pulse 71   Temp 97.7 F (36.5 C) (Temporal)   Ht '4\' 9"'$  (1.448 m)  Wt 270 lb 3.2 oz (122.6 kg)   SpO2 99%   BMI 58.47 kg/m      Physical Exam Cardiovascular:     Rate and Rhythm: Normal rate and regular rhythm.     Pulses: Normal pulses.  Neurological:     Mental Status: She is alert and oriented to person, place, and time.     No results found for any visits on 03/05/22.    Assessment & Plan:    Problem List Items Addressed This Visit       Cardiovascular and Mediastinum   Hypertension - Primary (Chronic)    BP at goal with lisinopril and furosemide BP Readings from Last 3 Encounters:  03/05/22 138/76  01/29/22 (!) 140/82  01/28/22 (!) 144/80    Maintain med dose Repeat BMP F/up in 53month      Relevant Orders   Renal Function Panel     Endocrine   Type 2 diabetes  mellitus with diabetic polyneuropathy, without long-term current use of insulin (HCC) (Chronic)    Stable with use of lyrica hgbA1c at 5.8% LE edema controlled with furosemide EOD. No ulcer present        Genitourinary   CKD (chronic kidney disease) stage 3, GFR 30-59 ml/min (HCC)   Relevant Orders   Renal Function Panel     Other   Depression, recurrent ([place]    Stable mood Denies need for medications or referral to therapist      Morbid obesity ([place]    Weight loss with use of ozempic weekly. Encourage to do chair exercise at home and/or join water aerobics class Wt Readings from Last 3 Encounters:  03/05/22 270 lb 3.2 oz (122.6 kg)  01/29/22 270 lb 3.2 oz (122.6 kg)  01/28/22 274 lb 9.6 oz (124.6 kg)         Return in about 3 months (around 06/04/2022) for HTN, DM, hyperlipidemia (fasting).     CWilfred Lacy NP

## 2022-03-05 NOTE — Telephone Encounter (Signed)
LVM for pt to call office to get scheduled for Mobile Mammo Unit.

## 2022-03-06 NOTE — Addendum Note (Signed)
Addended by: Trudie Buckler on: 03/06/2022 08:51 AM   Modules accepted: Orders

## 2022-03-11 ENCOUNTER — Telehealth: Payer: Self-pay | Admitting: *Deleted

## 2022-03-11 NOTE — Telephone Encounter (Signed)
Spoke with patient. She is ok with using [facility] for new machine. She will watch for a call from them within 1 week. She will need a visit within 30-90 days after setup on her new machine and should use machine at least 4 hours at night.   Order sent to [place].   Please call patient and schedule a follow-up for initial autopap approx 80 days from today's date and also please remind her to use her machine at least 4 hours at night per insurance requirements.

## 2022-03-11 NOTE — Telephone Encounter (Signed)
-----   Message from Ward Givens, NP sent at 03/06/2022  8:51 AM EST ----- Sleep study shows severe sleep apnea she should continue with CPAP therapy.  I will order a new machine AutoSet 5 to 10 cm of water

## 2022-03-12 ENCOUNTER — Encounter: Payer: Self-pay | Admitting: Nurse Practitioner

## 2022-03-12 NOTE — Telephone Encounter (Signed)
New, Willodean Rosenthal, RN; Alonna Minium; Minus Liberty; Nash Shearer Received, Thank you!     Previous Messages    ----- Message ----- From: Brandon Melnick, RN Sent: 03/11/2022   4:36 PM EST To: Darlina Guys; Miquel Dunn; Nash Shearer; * Subject: new machine                                    Order in [facility] [place] Female, 77 y.o., February 05, 1946 MRN: [id number]   Thanks sandy

## 2022-03-12 NOTE — Telephone Encounter (Signed)
Noted  

## 2022-03-12 NOTE — Telephone Encounter (Signed)
Called pt to scheduled initial CPAP appointment. Pt stated she haven't received her new machine and she would call us back and reschedule once she received. I reminded pt to use machine at least 4 hours a night once she received her CPAP machine.

## 2022-03-19 ENCOUNTER — Ambulatory Visit: Payer: Self-pay

## 2022-03-20 NOTE — Patient Outreach (Signed)
  Care Coordination   Follow Up Visit Note   03/20/2022 Name: Sandra Brennan MRN: [id number] DOB: [birth year]  Sandra Brennan is a 77 y.o. year old female who sees Nche, Charlene Brooke, NP for primary care. I spoke with  Sandra Brennan by phone today.  What matters to the patients health and wellness today?  I had a different type of headache    Goals Addressed             This Visit's Progress    I want to manage my blood pressure       Care Coordination Interventions: Solution-Focused Strategies employed:  Active listening / Reflection utilized  Emotional Support Provided BP Readings from Last 3 Encounters:  03/05/22 138/76  01/29/22 (!) 140/82  01/28/22 (!) 144/80  Evaluation of current treatment plan related to hypertension self management and patient's adherence to plan as established by provider Reviewed medications with patient and discussed importance of compliance Discussed plans with patient for ongoing care management follow up and provided patient with direct contact information for care management team Advised patient, providing education and rationale, to monitor blood pressure daily and record, calling PCP for findings outside established parameters Sandra Brennan reported feeling better after experiencing pain on the right side of her head that spread to her right arm. She had to rest and lie down after eating, but felt better after doing so. Although she normally experiences migraines, she stated that this felt different. I advised her to call her physician if she experiences anything similar in the future. She mentioned that her daughter had checked her blood pressure and kept the paper, so she was unable to provide me with that information for any values. However, she mentioned that her blood pressure was 138/76 during her visit on March 05, 2022. Sh will have the readings the next telephone call.          SDOH assessments and interventions completed:  No      Care Coordination Interventions:  Yes, provided   Follow up plan: Follow up call scheduled for 04/23/22 2 pm    Encounter Outcome:  Pt. Visit Completed   Lazaro Arms RN, BSN, [facility]   Phone: [phone]

## 2022-03-20 NOTE — Patient Instructions (Signed)
Visit Information  Thank you for taking time to visit with me today. Please don't hesitate to contact me if I can be of assistance to you.   Following are the goals we discussed today:   Goals Addressed             This Visit's Progress    I want to manage my blood pressure       Care Coordination Interventions: Solution-Focused Strategies employed:  Active listening / Reflection utilized  Emotional Support Provided BP Readings from Last 3 Encounters:  03/05/22 138/76  01/29/22 (!) 140/82  01/28/22 (!) 144/80  Evaluation of current treatment plan related to hypertension self management and patient's adherence to plan as established by provider Reviewed medications with patient and discussed importance of compliance Discussed plans with patient for ongoing care management follow up and provided patient with direct contact information for care management team Advised patient, providing education and rationale, to monitor blood pressure daily and record, calling PCP for findings outside established parameters Mrs. Farve reported feeling better after experiencing pain on the right side of her head that spread to her right arm. She had to rest and lie down after eating, but felt better after doing so. Although she normally experiences migraines, she stated that this felt different. I advised her to call her physician if she experiences anything similar in the future. She mentioned that her daughter had checked her blood pressure and kept the paper, so she was unable to provide me with that information for any values. However, she mentioned that her blood pressure was 138/76 during her visit on March 05, 2022. Sh will have the readings the next telephone call.          Our next appointment is by telephone on 04/23/22 at 2 pm  Please call the care guide team at [phone] if you need to cancel or reschedule your appointment.   If you are experiencing a Mental Health or [place] or need someone to talk to, please call 1-800-273-TALK (toll free, 24 hour hotline)  Patient verbalizes understanding of instructions and care plan provided today and agrees to view in [place]. Active MyChart status and patient understanding of how to access instructions and care plan via MyChart confirmed with patient.     Lazaro Arms RN, BSN, [facility]   Phone: [phone]

## 2022-03-25 DIAGNOSIS — G4733 Obstructive sleep apnea (adult) (pediatric): Secondary | ICD-10-CM | POA: Diagnosis not present

## 2022-03-25 NOTE — Telephone Encounter (Signed)
Pt was called back to r/s her initial CPAP f/u (between 04/26/22-06/24/22) after checking DPR a vm was left asking pt to call and r/s

## 2022-04-09 ENCOUNTER — Telehealth: Payer: Self-pay | Admitting: *Deleted

## 2022-04-09 NOTE — Progress Notes (Signed)
  Care Coordination Note  04/09/2022 Name: FARTUN PARADISO MRN: [id number] DOB: 1946/02/10  [name] Ilg is a 77 y.o. year old female who is a primary care patient of Nche, Charlene Brooke, NP and is actively engaged with the care management team. I reached out to Jolaine Click by phone today to assist with re-scheduling a follow up visit with the RN Case Manager  Follow up plan: Unsuccessful telephone outreach attempt made.  [place]  Direct Dial: [phone]

## 2022-04-11 NOTE — Progress Notes (Signed)
  Care Coordination Note  04/11/2022 Name: Sandra Brennan MRN: [id number] DOB: 05/07/1945  [name] is a 77 y.o. year old female who is a primary care patient of Nche, Charlene Brooke, NP and is actively engaged with the care management team. I reached out to Jolaine Click by phone today to assist with re-scheduling a follow up visit with the RN Case Manager  Follow up plan: Telephone appointment with care management team member scheduled for:04/18/22  [place]  Direct Dial: [phone]

## 2022-04-14 ENCOUNTER — Encounter: Payer: Self-pay | Admitting: Dietician

## 2022-04-14 ENCOUNTER — Encounter: Payer: 59 | Attending: Dietician | Admitting: Dietician

## 2022-04-14 DIAGNOSIS — E1142 Type 2 diabetes mellitus with diabetic polyneuropathy: Secondary | ICD-10-CM | POA: Diagnosis not present

## 2022-04-14 NOTE — Progress Notes (Unsigned)
Diabetes Self-Management Education  Visit Type: Follow-up  Appt. Start Time: 1455 Appt. End Time: [id number]  04/15/2022  [name], identified by name and date of birth, is a 77 y.o. female with a diagnosis of Diabetes:  .   ASSESSMENT Phone visit.  She was in her office and I was in my office.  Transportation is difficult. Patient was last seen by this RD on 02/13/2022.  Buried her son this week.  This is obviously very difficult for her.  She has a son and daughter who are very supportive.  This is her fist day alone.  Just bought scale and blood pressure cuff.  She states that she will start to document theses. She is going to do a 40 day plan for Easter.   She states that is going to give up soda and sweets and plans to drink soda and water and no meat except for Sunday. Drinks a Boost when she does not feel like eating. Plans to make an appointment with her dentist to try to get the dentures to stay in place.  She is unable to eat with them.  She can eat soft salad and soft foods without dentures.  Walks more.  Still wants to get transpiration to the [facility].  Medical Hx: HTN, HLD, Type 2 DM, Depression, GERD, Fibromyalgia, Anemia, depression, polyneuropathy, OSA on c-pap Medications: Prilosec, ozempic, vitamin D, MVI Labs: A1c - 5.8% 01/29/2022, 5.7% 10/01/21, 6.4, GFR - 46.52 (low) Notable Signs/Symptoms: Lower leg edema, SOB   Weight hx: 59" 270 lbs 03/05/2022 270 lbs 12/29/2021 273 lbs 12/27/2021 279 lbs 11/29/2021 260 lbs 08/27/2021 299 lbs 08/2020 198 lbs 2021.  Lost while doing water aerobics. 333 lbs 2020   Patient keeps her grandson weekdays while he is in school. He is 75 yo. Hasn't cooked much since her mother died.  Her grandson only eats chicken nuggets, fries, and [place] sausage.  She is working on increasing his acceptance.  She enjoys vegetables but frequently eats crackers, chips or other easy snacks that are high in sodium. Dentures don't fit.  Can eat salad  without dentures. Allergy:  pineapple which causes shortness of breath Walks with a walker or cane..  Unable to raise her arms above her head.  Likes water aerobics and is working on transportation. Works with [employer].   Her oldest daughter would like for her to move in with her.  She does not wish to at this time as she feels that she needs to help with her youngest daughter's child.    Diabetes Self-Management Education - 04/15/22 1800       Visit Information   Visit Type Follow-up      Psychosocial Assessment   Patient Belief/Attitude about Diabetes Motivated to manage diabetes    What is the hardest part about your diabetes right now, causing you the most concern, or is the most worrisome to you about your diabetes?   Making healty food and beverage choices    Other persons present Patient      Pre-Education Assessment   Patient understands the diabetes disease and treatment process. Comprehends key points    Patient understands incorporating nutritional management into lifestyle. Needs Review    Patient undertands incorporating physical activity into lifestyle. Comprehends key points    Patient understands using medications safely. Comprehends key points    Patient understands monitoring blood glucose, interpreting and using results Comprehends key points    Patient understands prevention, detection, and treatment of acute  complications. Comprehends key points    Patient understands prevention, detection, and treatment of chronic complications. Compreheands key points    Patient understands how to develop strategies to address psychosocial issues. Comprehends key points    Patient understands how to develop strategies to promote health/change behavior. Needs Review      Complications   Last HgB A1C per patient/outside source 5.8 %   01/29/22     Dietary Intake   Breakfast sausage gravy biscuit and OJ    Lunch skipps    Dinner grits   grieving   Beverage(s) water,  juice      Activity / Exercise   Activity / Exercise Type Light (walking / raking leaves)      Patient Education   Previous Diabetes Education Yes (please comment)   01/2022   Healthy Eating Meal options for control of blood glucose level and chronic complications.    Being Active Role of exercise on diabetes management, blood pressure control and cardiac health.    Diabetes Stress and Support Identified and addressed patients feelings and concerns about diabetes;Worked with patient to identify barriers to care and solutions      Individualized Goals (developed by patient)   Nutrition General guidelines for healthy choices and portions discussed    Physical Activity Exercise 3-5 times per week;30 minutes per day      Patient Self-Evaluation of Goals - Patient rates self as meeting previously set goals (% of time)   Nutrition 50 - 75 % (half of the time)    Physical Activity 25 - 50% (sometimes)    Medications >75% (most of the time)    Monitoring Not Applicable    Problem Solving and behavior change strategies  50 - 75 % (half of the time)    Reducing Risk (treating acute and chronic complications) 50 - 75 % (half of the time)    Health Coping 50 - 75 % (half of the time)      Post-Education Assessment   Patient understands the diabetes disease and treatment process. Demonstrates understanding / competency    Patient understands incorporating nutritional management into lifestyle. Needs Review    Patient undertands incorporating physical activity into lifestyle. Demonstrates understanding / competency    Patient understands using medications safely. Demonstrates understanding / competency    Patient understands monitoring blood glucose, interpreting and using results Demonstrates understanding / competency    Patient understands prevention, detection, and treatment of acute complications. Demonstrates understanding / competency    Patient understands prevention, detection, and  treatment of chronic complications. Demonstrates understanding / competency    Patient understands how to develop strategies to address psychosocial issues. Comprehends key points    Patient understands how to develop strategies to promote health/change behavior. Needs Review      Outcomes   Expected Outcomes Demonstrated interest in learning. Expect positive outcomes    Future DMSE 2 months    Program Status Not Completed      Subsequent Visit   Since your last visit have you continued or begun to take your medications as prescribed? Yes    Since your last visit have you experienced any weight changes? No change             Individualized Plan for Diabetes Self-Management Training:   Learning Objective:  Patient will have a greater understanding of diabetes self-management. Patient education plan is to attend individual and/or group sessions per assessed needs and concerns.   Plan:   Patient Instructions  Continue to stay active.  Walk as much as you are able.  Small amounts throughout the day add up.  Consider mindful choices when eating out.  Discuss your needs with those who bring you meals.  Expected Outcomes:  Demonstrated interest in learning. Expect positive outcomes  Education material provided:   If problems or questions, patient to contact team via:  Phone  Future DSME appointment: 2 months

## 2022-04-15 NOTE — Patient Instructions (Signed)
Continue to stay active.  Walk as much as you are able.  Small amounts throughout the day add up.  Consider mindful choices when eating out.  Discuss your needs with those who bring you meals.

## 2022-04-16 ENCOUNTER — Other Ambulatory Visit: Payer: Self-pay | Admitting: Nurse Practitioner

## 2022-04-16 DIAGNOSIS — E1142 Type 2 diabetes mellitus with diabetic polyneuropathy: Secondary | ICD-10-CM

## 2022-04-16 DIAGNOSIS — F339 Major depressive disorder, recurrent, unspecified: Secondary | ICD-10-CM

## 2022-04-18 ENCOUNTER — Ambulatory Visit: Payer: Self-pay

## 2022-04-18 NOTE — Patient Instructions (Signed)
Visit Information  Thank you for taking time to visit with me today. Please don't hesitate to contact me if I can be of assistance to you.   Following are the goals we discussed today:   Goals Addressed             This Visit's Progress    I want to manage my blood pressure         Patient Goals/Self Care Activities: -Patient/Caregiver will self-administer medications as prescribed as evidenced by self-report/primary caregiver report  -Patient/Caregiver will attend all scheduled provider appointments as evidenced by clinician review of documented attendance to scheduled appointments and patient/caregiver report -Patient/Caregiver will call pharmacy for medication refills as evidenced by patient report and review of pharmacy fill history as appropriate -Patient/Caregiver will call provider office for new concerns or questions as evidenced by review of documented incoming telephone call notes and patient report -Patient/Caregiver verbalizes understanding of plan -Patient/Caregiver will focus on medication adherence by taking medications as prescribed  -Calls provider office for new concerns, questions, or BP outside discussed parameters -Checks BP and records as discussed -Follows a low sodium diet/DASH diet   BP Readings from Last 3 Encounters:  03/05/22 138/76  01/29/22 (!) 140/82  01/28/22 (!) 144/80          Our next appointment is by telephone on 05/15/22 at 1030 am  Please call the care guide team at [phone] if you need to cancel or reschedule your appointment.   If you are experiencing a Mental Health or [place] or need someone to talk to, please call 1-800-273-TALK (toll free, 24 hour hotline)  Patient verbalizes understanding of instructions and care plan provided today and agrees to view in [place]. Active MyChart status and patient understanding of how to access instructions and care plan via MyChart confirmed with patient.     Lazaro Arms RN,  BSN, [facility]   Phone: [phone]

## 2022-04-18 NOTE — Patient Outreach (Signed)
  Care Coordination   Follow Up Visit Note   04/18/2022 Name: KALIYAN HOPPE MRN: [id number] DOB: 1945/04/20  [name] Fambrough is a 77 y.o. year old female who sees Nche, Charlene Brooke, NP for primary care. I spoke with  [name] Cerra by phone today.  What matters to the patients health and wellness today?  Mrs. Bandura woke up this morning experiencing allergies caused by her CPAP machine. She had sneezing, congestion, and a slight headache. She took Mucinex DM and some Zyrtec to alleviate her symptoms. She recently purchased a blood pressure monitor, and her daughter has been helping her check and write down her readings. Unfortunately, she doesn't have the paper with her right now and cannot remember the values. Mrs. Brackins is currently seeing a nutritionist to monitor her diet, which will also help with her hypertension. I congratulated her on her efforts. She opened up about dealing with the loss of her son but is trying to stay strong for her family, who are very close-knit. 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    Goals Addressed             This Visit's Progress    I want to manage my blood pressure         Patient Goals/Self Care Activities: -Patient/Caregiver will self-administer medications as prescribed as evidenced by self-report/primary caregiver report  -Patient/Caregiver will attend all scheduled provider appointments as evidenced by clinician review of documented attendance to scheduled appointments and patient/caregiver report -Patient/Caregiver will call pharmacy for medication refills as evidenced by patient report and review of pharmacy fill history as appropriate -Patient/Caregiver will call provider office for new concerns or questions as evidenced by review of documented incoming telephone call notes and patient report -Patient/Caregiver verbalizes understanding of  plan -Patient/Caregiver will focus on medication adherence by taking medications as prescribed  -Calls provider office for new concerns, questions, or BP outside discussed parameters -Checks BP and records as discussed -Follows a low sodium diet/DASH diet   BP Readings from Last 3 Encounters:  03/05/22 138/76  01/29/22 (!) 140/82  01/28/22 (!) 144/80          SDOH assessments and interventions completed:  No     Care Coordination Interventions:  Yes, provided   Interventions Today    Flowsheet Row Most Recent Value  Chronic Disease   Chronic disease during today's visit Hypertension (HTN)  General Interventions   General Interventions Discussed/Reviewed General Interventions Discussed, General Interventions Reviewed  Mental Health Interventions   Mental Health Discussed/Reviewed Other  [ Solution-Focused Strategies employed:    Active listening / Reflection utilized    Emotional Support Provided]  Nutrition Interventions   Nutrition Discussed/Reviewed Nutrition Discussed  Pharmacy Interventions   Pharmacy Dicussed/Reviewed Pharmacy Topics Discussed  Safety Interventions   Safety Discussed/Reviewed Safety Discussed      Follow up plan: Follow up call scheduled for 05/15/22  1030 am    Encounter Outcome:  Pt. Visit Completed   Lazaro Arms RN, BSN, [facility]   Phone: [phone]

## 2022-04-21 ENCOUNTER — Encounter: Payer: Self-pay | Admitting: Nurse Practitioner

## 2022-04-21 DIAGNOSIS — Z1231 Encounter for screening mammogram for malignant neoplasm of breast: Secondary | ICD-10-CM | POA: Diagnosis not present

## 2022-04-21 LAB — HM MAMMOGRAPHY

## 2022-04-25 DIAGNOSIS — G4733 Obstructive sleep apnea (adult) (pediatric): Secondary | ICD-10-CM | POA: Diagnosis not present

## 2022-04-29 ENCOUNTER — Telehealth: Payer: Self-pay | Admitting: Nurse Practitioner

## 2022-04-29 DIAGNOSIS — E1142 Type 2 diabetes mellitus with diabetic polyneuropathy: Secondary | ICD-10-CM

## 2022-04-29 MED ORDER — OZEMPIC (0.25 OR 0.5 MG/DOSE) 2 MG/3ML ~~LOC~~ SOPN: 0.5000 mg | PEN_INJECTOR | SUBCUTANEOUS | 0 refills | Status: DC

## 2022-04-29 NOTE — Telephone Encounter (Signed)
Caller Name: Ander Purpura from [facility] Call back phone #: [phone]  Reason for Call: [facility] is requesting that the order for ozempic be for 3 months to make the pts copay lower. Call if you need more information.[facility] [address]

## 2022-05-15 ENCOUNTER — Ambulatory Visit: Payer: Self-pay

## 2022-05-15 NOTE — Patient Outreach (Signed)
  Care Coordination   Follow Up Visit Note   05/15/2022 Name: TENNYSON KALLEN MRN: [id number] DOB: 1945/04/19  [name] Avera is a 76 y.o. year old female who sees Nche, Charlene Brooke, NP for primary care. I spoke with  [name] Rathgeber by phone today.  What matters to the patients health and wellness today?  Mrs. Cielo woke up feeling the effects of her allergies and eczema. She's been making an effort to stay motivated by exercising more and getting outside for walks, which she finds helpful. In addition to this, she takes care of her grandson during the week, which often leaves her feeling tired and fatigued, causing her to need more sleep. Her blood pressure has been fluctuating recently, with readings of 150/97 HR 79 on the 13th and 165/94 HR 81 on the 12th. We reviewed how to properly take her blood pressure. Mrs. Gossen also experiences headaches and occasionally takes medication for migraines and sometimes has chest pain thaat lst for a short time.    Goals Addressed             This Visit's Progress    I want to manage my blood pressure         Patient Goals/Self Care Activities: -Patient/Caregiver will self-administer medications as prescribed as evidenced by self-report/primary caregiver report  -Patient/Caregiver will attend all scheduled provider appointments as evidenced by clinician review of documented attendance to scheduled appointments and patient/caregiver report -Patient/Caregiver will call pharmacy for medication refills as evidenced by patient report and review of pharmacy fill history as appropriate -Patient/Caregiver will call provider office for new concerns or questions as evidenced by review of documented incoming telephone call notes and patient report -Patient/Caregiver verbalizes understanding of plan -Patient/Caregiver will focus on medication adherence by taking medications as prescribed  -Calls provider office for new concerns, questions, or BP outside  discussed parameters -Checks BP and records as discussed -continue to walk and be  motivated , exercise  BP Readings from Last 3 Encounters:  03/05/22 138/76  01/29/22 (!) 140/82  01/28/22 (!) 144/80          SDOH assessments and interventions completed:  No     Care Coordination Interventions:  Yes, provided   Interventions Today    Flowsheet Row Most Recent Value  Chronic Disease   Chronic disease during today's visit Hypertension (HTN)  General Interventions   General Interventions Discussed/Reviewed General Interventions Reviewed  Exercise Interventions   Exercise Discussed/Reviewed Exercise Discussed  Education Interventions   Education Provided Provided Education  Provided Verbal Education On Other  [How to take a blood pressure]       Follow up plan: Follow up call scheduled for 06/15/22 1015 am    Encounter Outcome:  Pt. Visit Completed   Lazaro Arms RN, BSN, [facility]   Phone: [phone]

## 2022-05-15 NOTE — Patient Instructions (Signed)
Visit Information  Thank you for taking time to visit with me today. Please don't hesitate to contact me if I can be of assistance to you.   Following are the goals we discussed today:   Goals Addressed             This Visit's Progress    I want to manage my blood pressure         Patient Goals/Self Care Activities: -Patient/Caregiver will self-administer medications as prescribed as evidenced by self-report/primary caregiver report  -Patient/Caregiver will attend all scheduled provider appointments as evidenced by clinician review of documented attendance to scheduled appointments and patient/caregiver report -Patient/Caregiver will call pharmacy for medication refills as evidenced by patient report and review of pharmacy fill history as appropriate -Patient/Caregiver will call provider office for new concerns or questions as evidenced by review of documented incoming telephone call notes and patient report -Patient/Caregiver verbalizes understanding of plan -Patient/Caregiver will focus on medication adherence by taking medications as prescribed  -Calls provider office for new concerns, questions, or BP outside discussed parameters -Checks BP and records as discussed -continue to walk and be  motivated , exercise  BP Readings from Last 3 Encounters:  03/05/22 138/76  01/29/22 (!) 140/82  01/28/22 (!) 144/80          Our next appointment is by telephone on 06/15/22 at 1015 am  Please call the care guide team at [phone] if you need to cancel or reschedule your appointment.   If you are experiencing a Mental Health or [place] or need someone to talk to, please call 1-800-273-TALK (toll free, 24 hour hotline)  The patient verbalized understanding of instructions, educational materials, and care plan provided today.    Lazaro Arms RN, BSN, [facility]   Phone: [phone]

## 2022-05-22 ENCOUNTER — Ambulatory Visit: Payer: Medicaid Other

## 2022-05-22 DIAGNOSIS — E1142 Type 2 diabetes mellitus with diabetic polyneuropathy: Secondary | ICD-10-CM

## 2022-05-22 DIAGNOSIS — I10 Essential (primary) hypertension: Secondary | ICD-10-CM

## 2022-05-22 NOTE — Progress Notes (Signed)
Care Management & Coordination Services Pharmacy Note  05/22/2022 Name:  Sandra Brennan MRN:  [id number] DOB:  01/12/1946  Summary: Patient presents for follow-up consult.   Son passed two weeks ago.   Worried about dropping things, worried she may have a tremor.   Recommendations/Changes made from today's visit: Continue current medications  Follow up plan: CPP follow-up 6 months   Subjective: Sandra Brennan is an 77 y.o. year old female who is a primary patient of Nche, Charlene Brooke, NP.  The care coordination team was consulted for assistance with disease management and care coordination needs.    Engaged with patient by telephone for follow up visit.  Recent office visits: 03/05/2022: Patient presented to Wilfred Lacy, NP for follow-up.  01/29/22: Patient presented to Wilfred Lacy, NP for follow-up.  01/01/22: Patient presented to Wilfred Lacy, NP for follow-up.  Recent consult visits: 01/28/22: Patient presented to Ward Givens, NP (Neurology) for OSA.  01/15/22: Patient presented to Roque Cash, NP (Pain Mgmt)   Hospital visits: None in previous 6 months   Objective:  Lab Results  Component Value Date   CREATININE 0.99 03/05/2022   BUN 14 03/05/2022   GFR 56.08 (L) 03/05/2022   GFRNONAA 45 (L) 08/25/2021   GFRAA 46 (L) 03/07/2019   NA 141 03/05/2022   K 4.0 03/05/2022   CALCIUM 8.9 03/05/2022   CO2 27 03/05/2022   GLUCOSE 81 03/05/2022    Lab Results  Component Value Date/Time   HGBA1C 5.8 01/29/2022 11:00 AM   HGBA1C 5.7 (A) 10/01/2021 09:41 AM   HGBA1C 5.7 04/05/2021 09:54 AM   HGBA1C 6.4 10/03/2020 09:31 AM   GFR 56.08 (L) 03/05/2022 09:21 AM   GFR 44.55 (L) 01/29/2022 11:00 AM   MICROALBUR 1.5 06/03/2021 10:55 AM   MICROALBUR <0.7 03/26/2020 02:15 PM    Last diabetic Eye exam:  Lab Results  Component Value Date/Time   HMDIABEYEEXA No Retinopathy 09/30/2021 10:55 AM    Last diabetic Foot exam: No results found for: "HMDIABFOOTEX"    Lab Results  Component Value Date   CHOL 135 01/01/2022   HDL 59.60 01/01/2022   LDLCALC 61 01/01/2022   TRIG 73.0 01/01/2022   CHOLHDL 2 01/01/2022       Latest Ref Rng & Units 03/05/2022    9:21 AM 01/01/2022   11:13 AM 08/25/2021    7:40 AM  Hepatic Function  Total Protein 6.5 - 8.1 g/dL   6.9   Albumin 3.5 - 5.2 g/dL 3.9  3.8  3.4   AST 15 - 41 U/L   31   ALT 0 - 44 U/L   10   Alk Phosphatase 38 - 126 U/L   75   Total Bilirubin 0.3 - 1.2 mg/dL   0.9     Lab Results  Component Value Date/Time   TSH 1.59 04/05/2021 01:20 PM   TSH 2.80 02/26/2017 08:38 AM       Latest Ref Rng & Units 08/25/2021    7:40 AM 01/06/2021    3:11 PM 01/05/2021    8:17 AM  CBC  WBC 4.0 - 10.5 K/uL 5.6  7.9  7.9   Hemoglobin 12.0 - 15.0 g/dL 11.9  11.2  11.4   Hematocrit 36.0 - 46.0 % 38.3  35.8  35.6   Platelets 150 - 400 K/uL 209  218  213     Lab Results  Component Value Date/Time   VD25OH 83.12 04/05/2021 09:54 AM   VITAMINB12 877 08/21/2020 03:58  AM   VITAMINB12 307 07/14/2014 10:14 AM    Clinical ASCVD: No  The 10-year ASCVD risk score (Arnett DK, et al., 2019) is: 26.2%   Values used to calculate the score:     Age: 37 years     Sex: Female     Is Non-Hispanic African American: Yes     Diabetic: Yes     Tobacco smoker: No     Systolic Blood Pressure: 0000000 mmHg     Is BP treated: Yes     HDL Cholesterol: 59.6 mg/dL     Total Cholesterol: 135 mg/dL       01/15/2022   10:44 AM 01/01/2022   11:13 AM 11/26/2021    1:51 PM  Depression screen PHQ 2/9  Decreased Interest 1 0 0  Down, Depressed, Hopeless 1 0 0  PHQ - 2 Score 2 0 0  Altered sleeping  0   Tired, decreased energy  0   Change in appetite  3   Feeling bad or failure about yourself   0   Trouble concentrating  0   Moving slowly or fidgety/restless  0   Suicidal thoughts  0   PHQ-9 Score  3   Difficult doing work/chores  Not difficult at all      Social History   Tobacco Use  Smoking Status Former    Packs/day: 0.12   Years: 2.00   Additional pack years: 0.00   Total pack years: 0.24   Types: Cigarettes  Smokeless Tobacco Never  Tobacco Comments   08/03/2012 "quit 30 years ago"   BP Readings from Last 3 Encounters:  03/05/22 138/76  01/29/22 (!) 140/82  01/28/22 (!) 144/80   Pulse Readings from Last 3 Encounters:  03/05/22 71  01/29/22 (!) 57  01/28/22 75   Wt Readings from Last 3 Encounters:  03/05/22 270 lb 3.2 oz (122.6 kg)  01/29/22 270 lb 3.2 oz (122.6 kg)  01/28/22 274 lb 9.6 oz (124.6 kg)   BMI Readings from Last 3 Encounters:  03/05/22 58.47 kg/m  01/29/22 58.47 kg/m  01/28/22 59.42 kg/m    Allergies  Allergen Reactions   Cymbalta [Duloxetine Hcl] Anaphylaxis   Pineapple Shortness Of Breath   Sulfa Antibiotics Hives, Shortness Of Breath and Itching    REACTION: unknown   Influenza Vaccines Hives and Itching   Penicillins Hives   Theophyllines Hives    Medications Reviewed Today     Reviewed by Clydell Hakim, RD (Registered Dietitian) on 04/14/22 at 77  Med List Status: <None>   Medication Order Taking? Sig Documenting Provider Last Dose Status Informant  albuterol (VENTOLIN HFA) 108 (90 Base) MCG/ACT inhaler [id number] No INHALE 1 PUFF INTO THE LUNGS EVERY 6 (SIX) HOURS AS NEEDED FOR SHORTNESS OF BREATH OR WHEEZING. Nche, Charlene Brooke, NP Taking Active   allopurinol (ZYLOPRIM) 100 MG tablet [id number] No Take 1 tablet (100 mg total) by mouth daily. Flossie Buffy, NP Taking Active   aspirin EC 81 MG tablet [id number] No Take 81 mg by mouth daily. [provider] Taking Active Self  atorvastatin (LIPITOR) 10 MG tablet [id number] No TAKE 1 TABLET BY MOUTH  DAILY AT 6 PM. Nche, Charlene Brooke, NP Taking Active   Carboxymethylcellulose Sodium (EYE DROPS OP) [id number] No Apply to eye. Lubricant eye drops [provider] Taking Active Self  cetirizine (ZYRTEC) 10 MG tablet [id number] No Take 10 mg by mouth daily as needed. [provider] Taking Active Self  cholecalciferol (VITAMIN D3)  25 MCG (1000 UNIT) tablet [id number] No Take 1,000 Units by mouth daily. [provider] Taking Active Self  dextromethorphan-guaiFENesin (MUCINEX DM) 30-600 MG 12hr tablet [id number] No Take 1 tablet by mouth 2 (two) times daily. [provider] Taking Active   furosemide (LASIX) 20 MG tablet [id number] No Take 0.5 tablets (10 mg total) by mouth every other day. Nche, Charlene Brooke, NP Taking Active   lisinopril (ZESTRIL) 40 MG tablet [id number] No Take 1 tablet (40 mg total) by mouth daily. Flossie Buffy, NP Taking Active   Multiple Vitamin (MULTIVITAMIN) tablet [id number] No Take 1 tablet by mouth daily. [provider] Taking Active Self  omeprazole (PRILOSEC) 20 MG capsule [id number] No TAKE 1 CAPSULE BY MOUTH EVERY DAY Nche, Charlene Brooke, NP Taking Active   pregabalin (LYRICA) 150 MG capsule [id number] No TAKE 1 CAPSULE BY MOUTH TWICE A DAY Kirsteins, Luanna Salk, MD Taking Active   Rimegepant Sulfate (NURTEC) 75 MG TBDP [id number] No Take 75 mg by mouth as needed (take 1 at onset of headache, max is 75 in 24 hours). Ward Givens, NP Taking Active   Semaglutide,0.25 or 0.5MG /DOS, (OZEMPIC, 0.25 OR 0.5 MG/DOSE,) 2 MG/3ML SOPN [id number] No INJECT 0.5 MG INTO THE SKIN ONCE A WEEK.  Patient not taking: Reported on 03/05/2022   Flossie Buffy, NP Not Taking Active   Sennosides 8.6 MG CAPS [id number] No Take 1 capsule by mouth daily. [provider] Taking Active Self           Med Note Germaine Pomfret   Thu Feb 07, 2021  4:04 PM)    SYMBICORT 160-4.5 MCG/ACT inhaler [id number] No Inhale 2 puffs into the lungs in the morning and at bedtime. Rinse mouth after each use Nche, Charlene Brooke, NP Taking Active   traMADol (ULTRAM) 50 MG tablet [id number] No Take 1 tablet (50 mg total) by mouth 3 (three) times daily. Bayard Hugger, NP Taking Active             SDOH:  (Social Determinants of  Health) assessments and interventions performed: Yes SDOH Interventions    Flowsheet Row Care Coordination from 01/30/2022 in [place] from 11/26/2021 in [place] at [place] Management from 02/07/2021 in [place] at [place] from 11/13/2020 in [place] at [facility] Visit from 04/09/2018 in [place] at [place] Interventions Intervention Not Indicated  [Care guides] Intervention Not Indicated -- Intervention Not Indicated --  Housing Interventions -- Intervention Not Indicated -- Intervention Not Indicated --  Transportation Interventions Intervention Not Indicated  [patient is going to contact her Insurance account manager and insurance] Intervention Not Indicated -- Intervention Not Indicated --  Depression Interventions/Treatment  -- -- -- -- Patient refuses Treatment  Financial Strain Interventions -- Intervention Not Indicated Intervention Not Indicated Intervention Not Indicated --  Physical Activity Interventions -- Intervention Not Indicated -- Intervention Not Indicated --  Stress Interventions -- Intervention Not Indicated -- Intervention Not Indicated --  Social Connections Interventions -- Intervention Not Indicated -- Intervention Not Indicated --       Medication Assistance: None required.  Patient affirms current coverage meets needs.  Medication Access: Within the past 30 days, how often has patient missed a dose of medication? None Is a pillbox or other method used to improve adherence? No  Factors  that may affect medication adherence? no barriers identified Are meds synced by current pharmacy? No  Are meds delivered by current pharmacy? No  Does patient experience delays in picking up medications due to transportation concerns? No    [facility] Services Reviewed: Is patient disadvantaged to use [facility]?: No  Current Rx insurance plan: Medicare Name and location of Current pharmacy:  [id number] - [place], [place] [address] Phone: [phone] Fax: [phone]  [facility] services reviewed with patient today?: No  Patient requests to transfer care to [facility] Pharmacy?: No  Reason patient declined to change pharmacies: Loyalty to other pharmacy/Patient preference  Compliance/Adherence/Medication fill history: Care Gaps: Shingrix  UACR   Star-Rating Drugs: Atorvastatin 10 mg: Last filled 03/23/22 for 90-DS  Lisinopril 40 mg: Last filled 03/23/22 for 90-DS   Assessment/Plan  Hypertension (BP goal <140/90) -Controlled -Current treatment: Furosemide 10 mg daily  Lisinopril 40 mg daily -Medications previously tried: NA  -Current home readings: She has a new blood pressure wrist cuff, checking sporadically:  165/85 151/97 Highest in recall was 168/88.  -Denies hypotensive/hypertensive symptoms  -Recommended to continue current medication  Hyperlipidemia: (LDL goal < 100) -Controlled -Current treatment: Atorvastatin 10 mg daily  -Medications previously tried: NA -Recommended to continue current medication  Diabetes (A1c goal <7%) -Controlled -Current medications: Ozempic 0.5 mg weekly -Medications previously tried: Metformin  -Reports some appetite reduction since starting Ozempic. Reports 20+ lbs. weight loss. Wants to start water aerobics but lack of transportation is a limiting factor. She plans to reach out to her insurance to discuss transportation options.  -Recommended to continue current medication  Depression/Anxiety (Goal: Achieve symptom remission) -Controlled -Current treatment: None -Medications previously tried/failed: Escitalopram -Stopped fluoxetine approximately 3 months ago, felt  like it made her depression worse. Took the medication for two weeks. Felt more fatigue and anhedonia symptoms.  -Recommended to continue current medication  Asthma (Goal: control symptoms and prevent exacerbations) -Controlled -Current treatment  Albuterol 1-2 puffs every 6 hours as needed Symbicort 2 puffs twice daily  -Medications previously tried: NA  -Exacerbations requiring treatment in last 6 months: None -Patient reports consistent use of maintenance inhaler -Frequency of rescue inhaler use: 2-3 times weekly, mostly when more active.  -Recommended to continue current medication  GERD (Goal: Prevent Heartburn/Reflux) -Controlled -Current treatment  Omeprazole 20 mg daily  -Medications previously tried: NA -Counseled on trigger avoidance.   -Recommended to continue current medication  Gout (Goal: Prevent gout flares) -Controlled -Last Gout Flare: 1 Year ago, both feet and legs  -Current treatment  Allopurinol 100 mg daily  -Medications previously tried: NA  -We discussed:  Counseled patient on low purine diet plan. Counseled patient to reduce consumption of high-fructose corn syrup, sweetened soft drinks, fruit juices, meat, and seafood. -Recommended to continue current medication  Chronic Pain (Goal: Minimize pain symptoms) -Controlled -Current treatment  Pregabalin 150 mg twice daily  Tramadol 50 mg three times daily  -Medications previously tried: NA  -Pain 8 or 9 without medicine. 5 or 6.  -Recommended to continue current medication  Migraines (Goal: Prevent migraines) -Controlled -Migraines are daily  -Current treatment  APAP 1000 mg  Nurtec 75 mg once daily as needed for headaches  -Medications previously tried: NA  -Reports symptoms requiring treatment ~1x monthly.  -Recommended to continue current medication  Chronic Kidney Disease Stage 3a  -All medications assessed for renal dosing and appropriateness in chronic kidney disease. -Recommended  to  continue current medication  Junius Argyle, PharmD, [place], CPP Clinical Pharmacist Practitioner  [facility]  [phone]

## 2022-05-28 ENCOUNTER — Other Ambulatory Visit: Payer: Self-pay | Admitting: Nurse Practitioner

## 2022-05-28 DIAGNOSIS — F339 Major depressive disorder, recurrent, unspecified: Secondary | ICD-10-CM

## 2022-05-28 DIAGNOSIS — I10 Essential (primary) hypertension: Secondary | ICD-10-CM

## 2022-06-02 DIAGNOSIS — G4733 Obstructive sleep apnea (adult) (pediatric): Secondary | ICD-10-CM | POA: Diagnosis not present

## 2022-06-04 ENCOUNTER — Encounter: Payer: Self-pay | Admitting: Nurse Practitioner

## 2022-06-04 ENCOUNTER — Ambulatory Visit (INDEPENDENT_AMBULATORY_CARE_PROVIDER_SITE_OTHER): Payer: 59 | Admitting: Nurse Practitioner

## 2022-06-04 VITALS — BP 150/78 | HR 71 | Temp 98.7°F | Resp 16 | Ht <= 58 in | Wt 269.8 lb

## 2022-06-04 DIAGNOSIS — E1142 Type 2 diabetes mellitus with diabetic polyneuropathy: Secondary | ICD-10-CM | POA: Diagnosis not present

## 2022-06-04 DIAGNOSIS — F339 Major depressive disorder, recurrent, unspecified: Secondary | ICD-10-CM | POA: Diagnosis not present

## 2022-06-04 DIAGNOSIS — I10 Essential (primary) hypertension: Secondary | ICD-10-CM | POA: Diagnosis not present

## 2022-06-04 DIAGNOSIS — K224 Dyskinesia of esophagus: Secondary | ICD-10-CM | POA: Insufficient documentation

## 2022-06-04 DIAGNOSIS — K5909 Other constipation: Secondary | ICD-10-CM | POA: Insufficient documentation

## 2022-06-04 LAB — POCT GLYCOSYLATED HEMOGLOBIN (HGB A1C): Hemoglobin A1C: 5.6 % (ref 4.0–5.6)

## 2022-06-04 LAB — MICROALBUMIN / CREATININE URINE RATIO
Creatinine,U: 227.8 mg/dL
Microalb Creat Ratio: 5.1 mg/g (ref 0.0–30.0)
Microalb, Ur: 11.5 mg/dL — ABNORMAL HIGH (ref 0.0–1.9)

## 2022-06-04 MED ORDER — AMLODIPINE BESYLATE 5 MG PO TABS: 5.0000 mg | ORAL_TABLET | Freq: Every evening | ORAL | 5 refills | Status: DC

## 2022-06-04 MED ORDER — ESCITALOPRAM OXALATE 10 MG PO TABS
10.0000 mg | ORAL_TABLET | Freq: Every day | ORAL | 5 refills | Status: DC
Start: 2022-06-04 — End: 2022-06-30

## 2022-06-04 NOTE — Assessment & Plan Note (Signed)
Home BP 150s-160s/90s BP not at goal with lisinopril 40mg  and furosemide 10mg  EOD BP Readings from Last 3 Encounters:  06/04/22 (!) 150/78  03/05/22 138/76  01/29/22 (!) 140/82    Add amlodipine 5mg  at hs Maintain other med doses F/up in 66month

## 2022-06-04 NOTE — Progress Notes (Signed)
Established Patient Visit  Patient: Sandra Brennan   DOB: 10/02/1945   77 y.o. Female  MRN: [id number] Visit Date: 06/04/2022  Subjective:    Chief Complaint  Patient presents with   Medical Management of Chronic Issues    Pt has log of bp    HPI Depression, recurrent ([place]) Waxing and waning, worse in last 34month due to unexpected death of son. Support system: family and church member. No SI/HI Unable to use cymbalta (anaphylaxis), fluoxetine and elavil (DDI with tramadol)  Agreed to start lexapro 10mg  F/up in 27month  Hypertension Home BP 150s-160s/90s BP not at goal with lisinopril 40mg  and furosemide 10mg  EOD BP Readings from Last 3 Encounters:  06/04/22 (!) 150/78  03/05/22 138/76  01/29/22 (!) 140/82    Add amlodipine 5mg  at hs Maintain other med doses F/up in 5month  Type 2 diabetes mellitus with diabetic polyneuropathy, without long-term current use of insulin ([place]) Repeat hgbA1c: 5.6% controlled with ozempic. Check urine microalbumin today Maintain med dose  BP Readings from Last 3 Encounters:  06/04/22 (!) 150/78  03/05/22 138/76  01/29/22 (!) 140/82    Wt Readings from Last 3 Encounters:  06/04/22 269 lb 12.8 oz (122.4 kg)  03/05/22 270 lb 3.2 oz (122.6 kg)  01/29/22 270 lb 3.2 oz (122.6 kg)       06/04/2022   10:04 AM 06/04/2022   10:02 AM 01/15/2022   10:44 AM  Depression screen PHQ 2/9  Decreased Interest 2 2 1   Down, Depressed, Hopeless 1 1 1   PHQ - 2 Score 3 3 2   Altered sleeping 3    Tired, decreased energy 2    Change in appetite 3    Feeling bad or failure about yourself  0    Trouble concentrating 0    Moving slowly or fidgety/restless 0    Suicidal thoughts 0    PHQ-9 Score 11    Difficult doing work/chores Somewhat difficult         06/04/2022   10:05 AM 01/01/2022   11:13 AM 07/01/2021    8:56 AM 06/03/2021   10:40 AM  GAD 7 : Generalized Anxiety Score  Nervous, Anxious, on Edge 1 0 0 0  Control/stop worrying 3 0 0 0   Worry too much - different things 2 0 0 0  Trouble relaxing 1 0 0   Restless 0 0 0 3  Easily annoyed or irritable 1 0 0 0  Afraid - awful might happen 2 0 0 0  Total GAD 7 Score 10 0 0   Anxiety Difficulty Somewhat difficult Not difficult at all  Not difficult at all    Reviewed medical, surgical, and social history today  Medications: Outpatient Medications Prior to Visit  Medication Sig   albuterol (VENTOLIN HFA) 108 (90 Base) MCG/ACT inhaler INHALE 1 PUFF INTO THE LUNGS EVERY 6 (SIX) HOURS AS NEEDED FOR SHORTNESS OF BREATH OR WHEEZING.   allopurinol (ZYLOPRIM) 100 MG tablet Take 1 tablet (100 mg total) by mouth daily.   aspirin EC 81 MG tablet Take 81 mg by mouth daily.   atorvastatin (LIPITOR) 10 MG tablet TAKE 1 TABLET BY MOUTH  DAILY AT 6 PM.   Carboxymethylcellulose Sodium (EYE DROPS OP) Apply to eye. Lubricant eye drops   cetirizine (ZYRTEC) 10 MG tablet Take 10 mg by mouth daily as needed.   cholecalciferol (VITAMIN D3) 25 MCG (1000 UNIT) tablet Take  1,000 Units by mouth daily.   dextromethorphan-guaiFENesin (MUCINEX DM) 30-600 MG 12hr tablet Take 1 tablet by mouth 2 (two) times daily.   furosemide (LASIX) 20 MG tablet Take 0.5 tablets (10 mg total) by mouth every other day.   lisinopril (ZESTRIL) 40 MG tablet TAKE 1 TABLET BY MOUTH EVERY DAY   Multiple Vitamin (MULTIVITAMIN) tablet Take 1 tablet by mouth daily.   omeprazole (PRILOSEC) 20 MG capsule TAKE 1 CAPSULE BY MOUTH EVERY DAY   pregabalin (LYRICA) 150 MG capsule TAKE 1 CAPSULE BY MOUTH TWICE A DAY   Rimegepant Sulfate (NURTEC) 75 MG TBDP Take 75 mg by mouth as needed (take 1 at onset of headache, max is 75 in 24 hours).   Semaglutide,0.25 or 0.5MG /DOS, (OZEMPIC, 0.25 OR 0.5 MG/DOSE,) 2 MG/3ML SOPN Inject 0.5 mg into the skin once a week.   Sennosides 8.6 MG CAPS Take 1 capsule by mouth daily.   SYMBICORT 160-4.5 MCG/ACT inhaler Inhale 2 puffs into the lungs in the morning and at bedtime. Rinse mouth after each use    traMADol (ULTRAM) 50 MG tablet Take 1 tablet (50 mg total) by mouth 3 (three) times daily.   No facility-administered medications prior to visit.   Reviewed past medical and social history.   ROS per HPI above      Objective:  BP (!) 150/78   Pulse 71   Temp 98.7 F (37.1 C) (Temporal)   Resp 16   Ht 4\' 9"  (1.448 m)   Wt 269 lb 12.8 oz (122.4 kg)   SpO2 97%   BMI 58.38 kg/m      Physical Exam Cardiovascular:     Rate and Rhythm: Normal rate and regular rhythm.     Pulses: Normal pulses.     Heart sounds: Normal heart sounds.  Pulmonary:     Effort: Pulmonary effort is normal.     Breath sounds: Normal breath sounds.  Musculoskeletal:     Right lower leg: Edema present.     Left lower leg: Edema present.  Neurological:     Mental Status: She is alert and oriented to person, place, and time.     Results for orders placed or performed in visit on 06/04/22  POCT glycosylated hemoglobin (Hb A1C)  Result Value Ref Range   Hemoglobin A1C 5.6 4.0 - 5.6 %   HbA1c POC (<> result, manual entry)     HbA1c, POC (prediabetic range)     HbA1c, POC (controlled diabetic range)        Assessment & Plan:    Problem List Items Addressed This Visit       Cardiovascular and Mediastinum   Hypertension - Primary (Chronic)    Home BP 150s-160s/90s BP not at goal with lisinopril 40mg  and furosemide 10mg  EOD BP Readings from Last 3 Encounters:  06/04/22 (!) 150/78  03/05/22 138/76  01/29/22 (!) 140/82    Add amlodipine 5mg  at hs Maintain other med doses F/up in 62month      Relevant Medications   amLODipine (NORVASC) 5 MG tablet     Endocrine   Type 2 diabetes mellitus with diabetic polyneuropathy, without long-term current use of insulin (Chronic)    Repeat hgbA1c: 5.6% controlled with ozempic. Check urine microalbumin today Maintain med dose      Relevant Medications   escitalopram (LEXAPRO) 10 MG tablet   Other Relevant Orders   Microalbumin / creatinine urine  ratio   POCT glycosylated hemoglobin (Hb A1C) (Completed)     Other  Depression, recurrent    Waxing and waning, worse in last 74month due to unexpected death of son. Support system: family and church member. No SI/HI Unable to use cymbalta (anaphylaxis), fluoxetine and elavil (DDI with tramadol)  Agreed to start lexapro 10mg  F/up in 23month      Relevant Medications   escitalopram (LEXAPRO) 10 MG tablet   Return in about 4 weeks (around 07/02/2022) for depression and anxiety, HTN (video).     Wilfred Lacy, NP

## 2022-06-04 NOTE — Assessment & Plan Note (Signed)
Waxing and waning, worse in last 65month due to unexpected death of son. Support system: family and church member. No SI/HI Unable to use cymbalta (anaphylaxis), fluoxetine and elavil (DDI with tramadol)  Agreed to start lexapro 10mg  F/up in 51month

## 2022-06-04 NOTE — Assessment & Plan Note (Signed)
Repeat hgbA1c: 5.6% controlled with ozempic. Check urine microalbumin today Maintain med dose

## 2022-06-04 NOTE — Progress Notes (Signed)
normal

## 2022-06-04 NOTE — Patient Instructions (Addendum)
Start lexapro  and amlodipine Continue to monitor BP daily in AM hgbA1c of 5.6%: improved Maintain other med doses F/up in 75month (video)

## 2022-06-16 ENCOUNTER — Encounter: Payer: Self-pay | Admitting: Dietician

## 2022-06-16 ENCOUNTER — Encounter: Payer: 59 | Attending: Dietician | Admitting: Dietician

## 2022-06-16 VITALS — Wt 260.0 lb

## 2022-06-16 DIAGNOSIS — E1142 Type 2 diabetes mellitus with diabetic polyneuropathy: Secondary | ICD-10-CM | POA: Diagnosis not present

## 2022-06-16 NOTE — Progress Notes (Signed)
Medical Nutrition Therapy  Appointment Start time:  1315  Appointment End time:  1330 Phone visit.  She was in her office and I was in my office.  Transportation is difficult. Patient was last seen by this RD on 04/15/2022.  Primary concerns today: Patient states that she has been grieving about her son's death and had not been getting out of bed but had decided that this needed to change.  She states that her appetite is better.  Gave up soda and meat for [place] and states that she continues to eat more vegetables and decrease her soda and meat intake.  She states fibromyalgia pain has been worse recently. She has lost 10 lbs in the past 3 months.  Referral diagnosis: Type 2 Diabetes, obesity  Preferred learning style:  no preference indicated Learning readiness:  contemplating  NUTRITION ASSESSMENT   Anthropometrics  59" 260 lbs 06/04/2022 270 lbs 03/05/2022 270 lbs 12/29/2021 273 lbs 12/27/2021 279 lbs 11/29/2021 260 lbs 08/27/2021 299 lbs 08/2020 198 lbs 2021.  Lost while doing water aerobics. 333 lbs 2020   Clinical Medical Hx:  HTN, HLD, Type 2 DM, Depression, GERD, Fibromyalgia, Anemia, depression, polyneuropathy, OSA on c-pap  Medications:  Prilosec, ozempic, vitamin D, MVI  Labs:  A1c - 5.6% 06/04/2022, 5.8% 01/29/2022, GFR - 44 (low) 01/29/2022 Notable Signs/Symptoms:  Lower leg edema, SOB   Lifestyle & Dietary Hx Patient keeps her grandson weekdays while he is in school. He is 75 yo. Hasn't cooked much since her mother died.  Her grandson only eats chicken nuggets, fries, and [place] sausage.  She is working on increasing his acceptance.  She enjoys vegetables but frequently eats crackers, chips or other easy snacks that are high in sodium. Dentures don't fit.  Can eat salad without dentures. Allergy:  pineapple which causes shortness of breath Walks with a walker or cane..  Unable to raise her arms above her head.  Likes water aerobics and is working on transportation. Works with  [employer].   Her oldest daughter would like for her to move in with her.  She does not wish to at this time as she feels that she needs to help with her youngest daughter's child.   Current average weekly physical activity: wants to get  back into water aerobics when her son starts school.  States her family could help with transportation  24-Hr Dietary Recall First Meal: hash brown and sausage and gravy biscuit (haven't had this for weeks) OR Boost Snack: none Second Meal: none Snack: ritz crackers Third Meal: part of a cheese steak, rice, beans, fresh fruit cup OR salad with meat or without OR vegetable plate Snack: none Beverages: water, juice, lactaid milk, occasional soda  NUTRITION DIAGNOSIS  NB-1.1 Food and nutrition-related knowledge deficit As related to balance of carbohydrates, protein, and fat.  As evidenced by diet hx and patient report.   NUTRITION INTERVENTION  Nutrition education (E-1) on the following topics:  Benefits of increasing activity Balanced meals to provide proper nutrition  Handouts Provided Include  none  Learning Style & Readiness for Change Teaching method utilized: Visual & Auditory  Demonstrated degree of understanding via: Teach Back  Barriers to learning/adherence to lifestyle change: lack of transportation  Goals Established by Pt Balanced meals Low fat choices Increased vegetables   MONITORING & EVALUATION Dietary intake, weekly physical activity in 3 months.  Next Steps  Patient is to call for questions.

## 2022-06-17 ENCOUNTER — Ambulatory Visit: Payer: Self-pay

## 2022-06-17 NOTE — Patient Instructions (Signed)
Visit Information  Thank you for taking time to visit with me today. Please don't hesitate to contact me if I can be of assistance to you.   Following are the goals we discussed today:   Goals Addressed             This Visit's Progress    I want to manage my blood pressure         Patient Goals/Self Care Activities: -Patient/Caregiver will self-administer medications as prescribed as evidenced by self-report/primary caregiver report  -Patient/Caregiver will attend all scheduled provider appointments as evidenced by clinician review of documented attendance to scheduled appointments and patient/caregiver report -Patient/Caregiver will call pharmacy for medication refills as evidenced by patient report and review of pharmacy fill history as appropriate -Patient/Caregiver will call provider office for new concerns or questions as evidenced by review of documented incoming telephone call notes and patient report -Patient/Caregiver verbalizes understanding of plan -Patient/Caregiver will focus on medication adherence by taking medications as prescribed  -Calls provider office for new concerns, questions, or BP outside discussed parameters -Checks BP and records as discussed -continue to walk and be  motivated , exercise  BP Readings from Last 3 Encounters:  06/04/22 (!) 150/78  03/05/22 138/76  01/29/22 (!) 140/82  Blood pressure yesterday was 159/79        Our next appointment is by telephone on 07/17/22 at 1130 am  Please call the care guide team at [phone] if you need to cancel or reschedule your appointment.   If you are experiencing a Mental Health or Behavioral Health Crisis or need someone to talk to, please call 1-800-273-TALK (toll free, 24 hour hotline)  The patient verbalized understanding of instructions, educational materials, and care plan provided today and agreed to receive a mailed copy of patient instructions, educational materials, and care plan.   Juanell Fairly RN, BSN, [facility] Care Coordinator [facility]   Phone: [phone]

## 2022-06-17 NOTE — Patient Outreach (Signed)
  Care Coordination   Follow Up Visit Note   06/17/2022 Name: Sandra Brennan MRN: [id number] DOB: [birth year]  Sandra Brennan is a 77 y.o. year old female who sees Nche, Bonna Gains, NP for primary care. I spoke with  Sandra Brennan by phone today.  What matters to the patients health and wellness today?  Sandra Brennan has reported experiencing a lot of pain throughout her body, which she rates at 58 out of 10.  She is taking her lyrica and taking Tramadol  50 mg QID for the pain. She visited the doctor on April 3rd, and her blood pressure was 150/78. The doctor prescribed amlodipine , which she is taking. However, she has chosen not to take Lexapro due to the unpleasant side effects she experiences. She has a follow-up appointment with her doctor to discuss this issue.      Goals Addressed             This Visit's Progress    I want to manage my blood pressure         Patient Goals/Self Care Activities: -Patient/Caregiver will self-administer medications as prescribed as evidenced by self-report/primary caregiver report  -Patient/Caregiver will attend all scheduled provider appointments as evidenced by clinician review of documented attendance to scheduled appointments and patient/caregiver report -Patient/Caregiver will call pharmacy for medication refills as evidenced by patient report and review of pharmacy fill history as appropriate -Patient/Caregiver will call provider office for new concerns or questions as evidenced by review of documented incoming telephone call notes and patient report -Patient/Caregiver verbalizes understanding of plan -Patient/Caregiver will focus on medication adherence by taking medications as prescribed  -Calls provider office for new concerns, questions, or BP outside discussed parameters -Checks BP and records as discussed -continue to walk and be  motivated , exercise  BP Readings from Last 3 Encounters:  06/04/22 (!) 150/78  03/05/22 138/76   01/29/22 (!) 140/82  Blood pressure yesterday was 159/79        SDOH assessments and interventions completed:  No     Care Coordination Interventions:  Yes, provided   Interventions Today    Flowsheet Row Most Recent Value  Chronic Disease   Chronic disease during today's visit Hypertension (HTN)  General Interventions   General Interventions Discussed/Reviewed General Interventions Discussed  Exercise Interventions   Exercise Discussed/Reviewed Exercise Discussed  Mental Health Interventions   Mental Health Discussed/Reviewed Mental Health Discussed  Pharmacy Interventions   Pharmacy Dicussed/Reviewed Medication Adherence  Safety Interventions   Safety Discussed/Reviewed Safety Discussed        Follow up plan: Follow up call scheduled for 07/17/22  1130 am    Encounter Outcome:  Pt. Visit Completed   Juanell Fairly RN, BSN, [facility] Care Coordinator [facility]   Phone: [phone]

## 2022-06-18 ENCOUNTER — Other Ambulatory Visit: Payer: Self-pay | Admitting: Nurse Practitioner

## 2022-06-18 DIAGNOSIS — E782 Mixed hyperlipidemia: Secondary | ICD-10-CM

## 2022-06-19 ENCOUNTER — Ambulatory Visit: Payer: 59 | Admitting: Neurology

## 2022-06-24 DIAGNOSIS — G4733 Obstructive sleep apnea (adult) (pediatric): Secondary | ICD-10-CM | POA: Diagnosis not present

## 2022-06-24 NOTE — Progress Notes (Unsigned)
PATIENT: Sandra Brennan DOB: 1945-08-10  REASON FOR VISIT: follow up HISTORY FROM: patient PRIMARY NEUROLOGIST: Dr. Frances Furbish  Chief Complaint  Patient presents with   Follow-up    Rm 19, alone.   Doing well on cpap, feels refreshed .      HISTORY OF PRESENT ILLNESS: Today 06/25/22:  Sandra Brennan is a 77 y.o. female with a history of OSA on CPAP. Returns today for follow-up.  She reports that the CPAP is working well for her.  She has noticed the benefit when she is using it.  Her download is below  She states at least 3 times a month she will get a sharp stabbing pain in the head.  It typically resolves fairly quickly but in the moment it is painful.  She states she on occasion will take Nurtec to prevent the symptoms from coming back.  In the past we checked a sedimentation rate that was unremarkable.       REVIEW OF SYSTEMS: Out of a complete 14 system review of symptoms, the patient complains only of the following symptoms, and all other reviewed systems are negative.   ESS 9  ALLERGIES: Allergies  Allergen Reactions   Cymbalta [Duloxetine Hcl] Anaphylaxis   Pineapple Shortness Of Breath   Sulfa Antibiotics Hives, Shortness Of Breath and Itching    REACTION: unknown   Influenza Vaccines Hives and Itching   Penicillins Hives   Theophyllines Hives    HOME MEDICATIONS: Outpatient Medications Prior to Visit  Medication Sig Dispense Refill   albuterol (VENTOLIN HFA) 108 (90 Base) MCG/ACT inhaler INHALE 1 PUFF INTO THE LUNGS EVERY 6 (SIX) HOURS AS NEEDED FOR SHORTNESS OF BREATH OR WHEEZING. 8.5 each 3   allopurinol (ZYLOPRIM) 100 MG tablet Take 1 tablet (100 mg total) by mouth daily. 90 tablet 3   amLODipine (NORVASC) 5 MG tablet Take 1 tablet (5 mg total) by mouth every evening. 30 tablet 5   aspirin EC 81 MG tablet Take 81 mg by mouth daily.     atorvastatin (LIPITOR) 10 MG tablet TAKE 1 TABLET BY MOUTH  DAILY AT 6 PM. 90 tablet 3   Carboxymethylcellulose Sodium  (EYE DROPS OP) Apply to eye. Lubricant eye drops     cetirizine (ZYRTEC) 10 MG tablet Take 10 mg by mouth daily as needed.     cholecalciferol (VITAMIN D3) 25 MCG (1000 UNIT) tablet Take 1,000 Units by mouth daily.     dextromethorphan-guaiFENesin (MUCINEX DM) 30-600 MG 12hr tablet Take 1 tablet by mouth 2 (two) times daily.     escitalopram (LEXAPRO) 10 MG tablet Take 1 tablet (10 mg total) by mouth daily. 30 tablet 5   furosemide (LASIX) 20 MG tablet Take 0.5 tablets (10 mg total) by mouth every other day. 45 tablet 1   lisinopril (ZESTRIL) 40 MG tablet TAKE 1 TABLET BY MOUTH EVERY DAY 90 tablet 1   Multiple Vitamin (MULTIVITAMIN) tablet Take 1 tablet by mouth daily.     omeprazole (PRILOSEC) 20 MG capsule TAKE 1 CAPSULE BY MOUTH EVERY DAY 90 capsule 1   pregabalin (LYRICA) 150 MG capsule TAKE 1 CAPSULE BY MOUTH TWICE A DAY 60 capsule 5   Rimegepant Sulfate (NURTEC) 75 MG TBDP Take 75 mg by mouth as needed (take 1 at onset of headache, max is 75 in 24 hours). 10 tablet 11   Semaglutide,0.25 or 0.5MG /DOS, (OZEMPIC, 0.25 OR 0.5 MG/DOSE,) 2 MG/3ML SOPN Inject 0.5 mg into the skin once a week. 9 mL  0   Sennosides 8.6 MG CAPS Take 1 capsule by mouth daily.     SYMBICORT 160-4.5 MCG/ACT inhaler Inhale 2 puffs into the lungs in the morning and at bedtime. Rinse mouth after each use 30.6 each 5   traMADol (ULTRAM) 50 MG tablet Take 1 tablet (50 mg total) by mouth 3 (three) times daily. 90 tablet 5   No facility-administered medications prior to visit.    PAST MEDICAL HISTORY: Past Medical History:  Diagnosis Date   Acute nontraumatic kidney injury 05/05/2013   Acute posthemorrhagic anemia 09/06/2012   Anemia    Arthritis    "plenty" (13-Aug-2012)   Asthma    Chest pain 03/30/2016   Chronic lower back pain    "they say I need a whole new spinal column" (08-13-12)   COPD (chronic obstructive pulmonary disease)    Degenerative arthritis    "all over" (08-13-2012)   Fx ankle    GERD  (gastroesophageal reflux disease)    Gout 05/05/2013   Hypertension    Migraines    "used to; totally stopped when I quit drinking 30 yr ago" (08-13-12)   Myalgia and myositis    Myocardial infarction 1992   08/13/12 "mild MI when son died "   Obesity    Osteoporosis    "all over" (2012/08/13)   Shortness of breath    when stressed.   Sleep apnea    "never did fix my machine; haven't used one for 5 years; I've lost 116# since then; no problems now" (08-13-12)   Syncope 03/30/2016   Type II diabetes mellitus    "borderline; don't test; take Metformin" (Aug 13, 2012)    PAST SURGICAL HISTORY: Past Surgical History:  Procedure Laterality Date   ABDOMINAL HYSTERECTOMY  1990's   GANGLION CYST EXCISION Right 1980's   "wrist" (08/13/2012)   JOINT REPLACEMENT     KNEE LIGAMENT RECONSTRUCTION Right 1980's   TONSILLECTOMY  ~ 1954   TOTAL HIP ARTHROPLASTY Left 08/13/2012   TOTAL HIP ARTHROPLASTY Left 2012-08-13   Procedure: TOTAL HIP ARTHROPLASTY;  Surgeon: [name], MD;  Location: [facility] OR;  Service: Orthopedics;  Laterality: Left;  Left Total Hip Arthroplasty   TOTAL HIP ARTHROPLASTY Left 08/28/2012   Procedure: Irrigation and Debridement hip ;  Surgeon: Kathryne Hitch, MD;  Location: [facility] OR;  Service: Orthopedics;  Laterality: Left;   TOTAL KNEE ARTHROPLASTY Left 2001   TOTAL KNEE ARTHROPLASTY Right 2004    FAMILY HISTORY: Family History  Problem Relation Age of Onset   Arthritis Mother    Arthritis Father    Colon cancer Father    Diabetes Sister    Arthritis Sister    Diabetes Maternal Grandmother    Arthritis Maternal Grandmother    Diabetes Maternal Grandfather    Arthritis Maternal Grandfather    Migraines Neg Hx    Sleep apnea Neg Hx     SOCIAL HISTORY: Social History   Socioeconomic History   Marital status: Widowed    Spouse name: Not on file   Number of children: 5   Years of education: PhD   Highest education level: Not on file  Occupational History    Occupation: Retired  Tobacco Use   Smoking status: Former    Packs/day: 0.12    Years: 2.00    Additional pack years: 0.00    Total pack years: 0.24    Types: Cigarettes   Smokeless tobacco: Never   Tobacco comments:    August 13, 2012 "quit 30 years ago"  Vaping Use   Vaping Use: Never used  Substance and Sexual Activity   Alcohol use: No    Alcohol/week: 0.0 standard drinks of alcohol    Comment: 08/03/2012 "used to be a beeralcoholic"; stopped ~ 30 yr ago"   Drug use: No   Sexual activity: Never  Other Topics Concern   Not on file  Social History Narrative   Lives at home with her mother and caregiver.- mother past away 01/2021 and now lives alone.   Occasional use of caffeine.   Right-handed.   Social Determinants of Health   Financial Resource Strain: Low Risk  (11/26/2021)   Overall Financial Resource Strain (CARDIA)    Difficulty of Paying Living Expenses: Not hard at all  Food Insecurity: Food Insecurity Present (01/30/2022)   Hunger Vital Sign    Worried About Running Out of Food in the Last Year: Sometimes true    Ran Out of Food in the Last Year: Never true  Transportation Needs: Unmet Transportation Needs (01/30/2022)   PRAPARE - Administrator, Civil Service (Medical): Yes    Lack of Transportation (Non-Medical): No  Physical Activity: Insufficiently Active (11/26/2021)   Exercise Vital Sign    Days of Exercise per Week: 3 days    Minutes of Exercise per Session: 30 min  Stress: No Stress Concern Present (11/26/2021)   [employer] of Occupational Health - Occupational Stress Questionnaire    Feeling of Stress : Not at all  Social Connections: Moderately Isolated (11/26/2021)   Social Connection and Isolation Panel [NHANES]    Frequency of Communication with Friends and Family: More than three times a week    Frequency of Social Gatherings with Friends and Family: More than three times a week    Attends Religious Services: More than 4 times per year     Active Member of [employer] or Organizations: No    Attends Banker Meetings: Never    Marital Status: Widowed  Intimate Partner Violence: Not At Risk (11/26/2021)   Humiliation, Afraid, Rape, and Kick questionnaire    Fear of Current or Ex-Partner: No    Emotionally Abused: No    Physically Abused: No    Sexually Abused: No      PHYSICAL EXAM  Vitals:   06/25/22 1041  BP: (!) 141/72  Pulse: 69  Weight: 230 lb 9.6 oz (104.6 kg)  Height: 4\' 11"  (1.499 m)   Body mass index is 46.58 kg/m.  Generalized: Well developed, in no acute distress    Neurological examination  Mentation: Alert oriented to time, place, history taking. Follows all commands speech and language fluent Cranial nerve II-XII: Extraocular movements were full, visual field were full on confrontational test Head turning and shoulder shrug  were normal and symmetric. Motor: The motor testing reveals 5 over 5 strength of all 4 extremities.  Patient reports some discomfort with lifting her arms and having to hold them there Sensory: Sensory testing is intact to soft touch on all 4 extremities. No evidence of extinction is noted.  Gait and station: Uses a rollator when ambulating.  Gait is slow and cautious.   DIAGNOSTIC DATA (LABS, IMAGING, TESTING) - I reviewed patient records, labs, notes, testing and imaging myself where available.  Lab Results  Component Value Date   WBC 5.6 08/25/2021   HGB 11.9 (L) 08/25/2021   HCT 38.3 08/25/2021   MCV 92.1 08/25/2021   PLT 209 08/25/2021      Component Value Date/Time  NA 141 03/05/2022 0921   NA 142 07/14/2014 1014   K 4.0 03/05/2022 0921   CL 105 03/05/2022 0921   CO2 27 03/05/2022 0921   GLUCOSE 81 03/05/2022 0921   BUN 14 03/05/2022 0921   BUN 12 07/14/2014 1014   CREATININE 0.99 03/05/2022 0921   CREATININE 1.39 (H) 04/09/2018 1549   CALCIUM 8.9 03/05/2022 0921   PROT 6.9 08/25/2021 0740   PROT 6.9 07/14/2014 1014   ALBUMIN 3.9 03/05/2022  0921   ALBUMIN 4.3 07/14/2014 1014   AST 31 08/25/2021 0740   ALT 10 08/25/2021 0740   ALKPHOS 75 08/25/2021 0740   BILITOT 0.9 08/25/2021 0740   BILITOT 0.2 07/14/2014 1014   GFRNONAA 45 (L) 08/25/2021 0740   GFRAA 46 (L) 03/07/2019 2115   Lab Results  Component Value Date   CHOL 135 01/01/2022   HDL 59.60 01/01/2022   LDLCALC 61 01/01/2022   TRIG 73.0 01/01/2022   CHOLHDL 2 01/01/2022   Lab Results  Component Value Date   HGBA1C 5.6 06/04/2022   Lab Results  Component Value Date   VITAMINB12 877 08/21/2020   Lab Results  Component Value Date   TSH 1.59 04/05/2021      ASSESSMENT AND PLAN 77 y.o. year old female  has a past medical history of Acute nontraumatic kidney injury (05/05/2013), Acute posthemorrhagic anemia (09/06/2012), Anemia, Arthritis, Asthma, Chest pain (03/30/2016), Chronic lower back pain, COPD (chronic obstructive pulmonary disease), Degenerative arthritis, Fx ankle, GERD (gastroesophageal reflux disease), Gout (05/05/2013), Hypertension, Migraines, Myalgia and myositis, Myocardial infarction (1992), Obesity, Osteoporosis, Shortness of breath, Sleep apnea, Syncope (03/30/2016), and Type II diabetes mellitus. here with:  OSA on CPAP Primary stabbing headache  - CPAP compliance excellent - Good treatment of AHI  - Encourage patient to use CPAP nightly and > 4 hours each night - Continue Nurtec as needed - F/U in 6 -7 months  or sooner if needed    Butch Penny, MSN, NP-C 06/25/2022, 10:44 AM [facility] [address], [address] [place], [place] [address] [phone]

## 2022-06-25 ENCOUNTER — Encounter: Payer: Self-pay | Admitting: Adult Health

## 2022-06-25 ENCOUNTER — Ambulatory Visit (INDEPENDENT_AMBULATORY_CARE_PROVIDER_SITE_OTHER): Payer: 59 | Admitting: Adult Health

## 2022-06-25 VITALS — BP 141/72 | HR 69 | Ht 59.0 in | Wt 230.6 lb

## 2022-06-25 DIAGNOSIS — G4733 Obstructive sleep apnea (adult) (pediatric): Secondary | ICD-10-CM

## 2022-06-25 DIAGNOSIS — G4485 Primary stabbing headache: Secondary | ICD-10-CM | POA: Diagnosis not present

## 2022-06-25 MED ORDER — NURTEC 75 MG PO TBDP: 75.0000 mg | ORAL_TABLET | ORAL | 11 refills | Status: AC | PRN

## 2022-06-28 ENCOUNTER — Other Ambulatory Visit: Payer: Self-pay | Admitting: Nurse Practitioner

## 2022-06-28 DIAGNOSIS — F339 Major depressive disorder, recurrent, unspecified: Secondary | ICD-10-CM

## 2022-07-03 ENCOUNTER — Encounter: Payer: Self-pay | Admitting: Nurse Practitioner

## 2022-07-03 ENCOUNTER — Other Ambulatory Visit: Payer: Self-pay | Admitting: Physical Medicine & Rehabilitation

## 2022-07-03 ENCOUNTER — Telehealth (INDEPENDENT_AMBULATORY_CARE_PROVIDER_SITE_OTHER): Payer: 59 | Admitting: Nurse Practitioner

## 2022-07-03 ENCOUNTER — Other Ambulatory Visit: Payer: Self-pay | Admitting: Nurse Practitioner

## 2022-07-03 DIAGNOSIS — J4520 Mild intermittent asthma, uncomplicated: Secondary | ICD-10-CM

## 2022-07-03 DIAGNOSIS — F339 Major depressive disorder, recurrent, unspecified: Secondary | ICD-10-CM | POA: Diagnosis not present

## 2022-07-03 DIAGNOSIS — I10 Essential (primary) hypertension: Secondary | ICD-10-CM

## 2022-07-03 DIAGNOSIS — M1A00X0 Idiopathic chronic gout, unspecified site, without tophus (tophi): Secondary | ICD-10-CM

## 2022-07-03 DIAGNOSIS — R600 Localized edema: Secondary | ICD-10-CM

## 2022-07-03 NOTE — Telephone Encounter (Signed)
Call Placed to Ms Brotherton,  Checking on her Lyrica. She has a scheduled appointment next week.  Her voice mail is full , unable to leave a message.  Call placed to [facility], Lyrica was picked up on 05/30/2022.  Lyrica sent to pharmacy with no refills.

## 2022-07-03 NOTE — Progress Notes (Signed)
Virtual Visit via Video Note  I connected withNAME@ on 07/03/22 at  8:00 AM EDT by a video enabled telemedicine application and verified that I am speaking with the correct person using two identifiers.  Location: Patient:Home Provider: Office Participants: patient and provider  I discussed the limitations of evaluation and management by telemedicine and the availability of in person appointments. I also discussed with the patient that there may be a patient responsible charge related to this service. The patient expressed understanding and agreed to proceed.  CC: anxiety f/up  History of Present Illness:   Depression, recurrent (HCC) Stable mood with lexapro 10mg  in AM Reports some daytime somnolence. This could due to meds side effect (zrytec in AM, lyrica BID, lexapro in AM)  Maintain med dose but advised to take zyrtec and lexapro in PM.   Observations/Objective: Alert and oriented x4 Assessment and Plan: Sandra Brennan was seen today for depression and anxiety.  Diagnoses and all orders for this visit:  Depression, recurrent (HCC)   Follow Up Instructions: See instructions above   I discussed the assessment and treatment plan with the patient. The patient was provided an opportunity to ask questions and all were answered. The patient agreed with the plan and demonstrated an understanding of the instructions.   The patient was advised to call back or seek an in-person evaluation if the symptoms worsen or if the condition fails to improve as anticipated.  Alysia Penna, NP

## 2022-07-03 NOTE — Assessment & Plan Note (Signed)
Stable mood with lexapro 10mg  in AM Reports some daytime somnolence. This could due to meds side effect (zrytec in AM, lyrica BID, lexapro in AM)  Maintain med dose but advised to take zyrtec and lexapro in PM.

## 2022-07-06 ENCOUNTER — Emergency Department (HOSPITAL_COMMUNITY): Payer: 59

## 2022-07-06 ENCOUNTER — Inpatient Hospital Stay (HOSPITAL_COMMUNITY)
Admission: EM | Admit: 2022-07-06 | Discharge: 2022-07-11 | DRG: 641 | Disposition: A | Discharge status: Skilled Nursing Facility | Payer: 59 | Attending: Family Medicine | Admitting: Family Medicine

## 2022-07-06 ENCOUNTER — Encounter (HOSPITAL_COMMUNITY): Payer: Self-pay

## 2022-07-06 ENCOUNTER — Other Ambulatory Visit: Payer: Self-pay

## 2022-07-06 DIAGNOSIS — I252 Old myocardial infarction: Secondary | ICD-10-CM | POA: Diagnosis not present

## 2022-07-06 DIAGNOSIS — M109 Gout, unspecified: Secondary | ICD-10-CM | POA: Diagnosis present

## 2022-07-06 DIAGNOSIS — I160 Hypertensive urgency: Secondary | ICD-10-CM | POA: Diagnosis not present

## 2022-07-06 DIAGNOSIS — M4802 Spinal stenosis, cervical region: Secondary | ICD-10-CM | POA: Diagnosis not present

## 2022-07-06 DIAGNOSIS — E785 Hyperlipidemia, unspecified: Secondary | ICD-10-CM | POA: Diagnosis present

## 2022-07-06 DIAGNOSIS — M5416 Radiculopathy, lumbar region: Secondary | ICD-10-CM | POA: Diagnosis not present

## 2022-07-06 DIAGNOSIS — E1140 Type 2 diabetes mellitus with diabetic neuropathy, unspecified: Secondary | ICD-10-CM | POA: Diagnosis not present

## 2022-07-06 DIAGNOSIS — F419 Anxiety disorder, unspecified: Secondary | ICD-10-CM | POA: Diagnosis present

## 2022-07-06 DIAGNOSIS — I130 Hypertensive heart and chronic kidney disease with heart failure and stage 1 through stage 4 chronic kidney disease, or unspecified chronic kidney disease: Secondary | ICD-10-CM | POA: Diagnosis not present

## 2022-07-06 DIAGNOSIS — R55 Syncope and collapse: Secondary | ICD-10-CM | POA: Diagnosis present

## 2022-07-06 DIAGNOSIS — G4733 Obstructive sleep apnea (adult) (pediatric): Secondary | ICD-10-CM | POA: Diagnosis present

## 2022-07-06 DIAGNOSIS — Z6841 Body Mass Index (BMI) 40.0 and over, adult: Secondary | ICD-10-CM

## 2022-07-06 DIAGNOSIS — H9313 Tinnitus, bilateral: Secondary | ICD-10-CM | POA: Diagnosis not present

## 2022-07-06 DIAGNOSIS — K219 Gastro-esophageal reflux disease without esophagitis: Secondary | ICD-10-CM | POA: Diagnosis not present

## 2022-07-06 DIAGNOSIS — Z96653 Presence of artificial knee joint, bilateral: Secondary | ICD-10-CM | POA: Diagnosis present

## 2022-07-06 DIAGNOSIS — R0602 Shortness of breath: Secondary | ICD-10-CM | POA: Diagnosis not present

## 2022-07-06 DIAGNOSIS — K5909 Other constipation: Secondary | ICD-10-CM | POA: Diagnosis not present

## 2022-07-06 DIAGNOSIS — G8929 Other chronic pain: Secondary | ICD-10-CM | POA: Diagnosis present

## 2022-07-06 DIAGNOSIS — Z7951 Long term (current) use of inhaled steroids: Secondary | ICD-10-CM

## 2022-07-06 DIAGNOSIS — R404 Transient alteration of awareness: Secondary | ICD-10-CM | POA: Diagnosis not present

## 2022-07-06 DIAGNOSIS — Z882 Allergy status to sulfonamides status: Secondary | ICD-10-CM

## 2022-07-06 DIAGNOSIS — E1122 Type 2 diabetes mellitus with diabetic chronic kidney disease: Secondary | ICD-10-CM | POA: Diagnosis not present

## 2022-07-06 DIAGNOSIS — J4489 Other specified chronic obstructive pulmonary disease: Secondary | ICD-10-CM | POA: Diagnosis not present

## 2022-07-06 DIAGNOSIS — R1313 Dysphagia, pharyngeal phase: Secondary | ICD-10-CM | POA: Diagnosis not present

## 2022-07-06 DIAGNOSIS — R519 Headache, unspecified: Secondary | ICD-10-CM | POA: Diagnosis not present

## 2022-07-06 DIAGNOSIS — Z90710 Acquired absence of both cervix and uterus: Secondary | ICD-10-CM

## 2022-07-06 DIAGNOSIS — D649 Anemia, unspecified: Secondary | ICD-10-CM | POA: Diagnosis not present

## 2022-07-06 DIAGNOSIS — M810 Age-related osteoporosis without current pathological fracture: Secondary | ICD-10-CM | POA: Diagnosis not present

## 2022-07-06 DIAGNOSIS — M48061 Spinal stenosis, lumbar region without neurogenic claudication: Secondary | ICD-10-CM | POA: Diagnosis present

## 2022-07-06 DIAGNOSIS — Z79899 Other long term (current) drug therapy: Secondary | ICD-10-CM

## 2022-07-06 DIAGNOSIS — I5032 Chronic diastolic (congestive) heart failure: Secondary | ICD-10-CM | POA: Diagnosis not present

## 2022-07-06 DIAGNOSIS — I10 Essential (primary) hypertension: Secondary | ICD-10-CM | POA: Diagnosis not present

## 2022-07-06 DIAGNOSIS — N1831 Chronic kidney disease, stage 3a: Secondary | ICD-10-CM | POA: Diagnosis present

## 2022-07-06 DIAGNOSIS — Z7401 Bed confinement status: Secondary | ICD-10-CM | POA: Diagnosis not present

## 2022-07-06 DIAGNOSIS — Z743 Need for continuous supervision: Secondary | ICD-10-CM | POA: Diagnosis not present

## 2022-07-06 DIAGNOSIS — F329 Major depressive disorder, single episode, unspecified: Secondary | ICD-10-CM | POA: Diagnosis present

## 2022-07-06 DIAGNOSIS — M797 Fibromyalgia: Secondary | ICD-10-CM | POA: Diagnosis not present

## 2022-07-06 DIAGNOSIS — J45909 Unspecified asthma, uncomplicated: Secondary | ICD-10-CM | POA: Diagnosis not present

## 2022-07-06 DIAGNOSIS — E876 Hypokalemia: Secondary | ICD-10-CM | POA: Diagnosis not present

## 2022-07-06 DIAGNOSIS — M1990 Unspecified osteoarthritis, unspecified site: Secondary | ICD-10-CM | POA: Diagnosis present

## 2022-07-06 DIAGNOSIS — F4320 Adjustment disorder, unspecified: Secondary | ICD-10-CM | POA: Diagnosis present

## 2022-07-06 DIAGNOSIS — Z96642 Presence of left artificial hip joint: Secondary | ICD-10-CM | POA: Diagnosis present

## 2022-07-06 DIAGNOSIS — R531 Weakness: Secondary | ICD-10-CM | POA: Diagnosis not present

## 2022-07-06 DIAGNOSIS — M19011 Primary osteoarthritis, right shoulder: Secondary | ICD-10-CM | POA: Diagnosis not present

## 2022-07-06 DIAGNOSIS — Z887 Allergy status to serum and vaccine status: Secondary | ICD-10-CM

## 2022-07-06 DIAGNOSIS — I700 Atherosclerosis of aorta: Secondary | ICD-10-CM | POA: Diagnosis not present

## 2022-07-06 DIAGNOSIS — J449 Chronic obstructive pulmonary disease, unspecified: Secondary | ICD-10-CM | POA: Diagnosis not present

## 2022-07-06 DIAGNOSIS — M1A00X0 Idiopathic chronic gout, unspecified site, without tophus (tophi): Secondary | ICD-10-CM | POA: Diagnosis not present

## 2022-07-06 DIAGNOSIS — Z87891 Personal history of nicotine dependence: Secondary | ICD-10-CM

## 2022-07-06 DIAGNOSIS — R42 Dizziness and giddiness: Secondary | ICD-10-CM | POA: Diagnosis present

## 2022-07-06 DIAGNOSIS — G43909 Migraine, unspecified, not intractable, without status migrainosus: Secondary | ICD-10-CM | POA: Diagnosis present

## 2022-07-06 DIAGNOSIS — Z91018 Allergy to other foods: Secondary | ICD-10-CM

## 2022-07-06 DIAGNOSIS — Z888 Allergy status to other drugs, medicaments and biological substances status: Secondary | ICD-10-CM

## 2022-07-06 DIAGNOSIS — Z7985 Long-term (current) use of injectable non-insulin antidiabetic drugs: Secondary | ICD-10-CM

## 2022-07-06 DIAGNOSIS — Z8261 Family history of arthritis: Secondary | ICD-10-CM

## 2022-07-06 DIAGNOSIS — Z7982 Long term (current) use of aspirin: Secondary | ICD-10-CM

## 2022-07-06 DIAGNOSIS — Z833 Family history of diabetes mellitus: Secondary | ICD-10-CM

## 2022-07-06 DIAGNOSIS — R0609 Other forms of dyspnea: Secondary | ICD-10-CM | POA: Diagnosis not present

## 2022-07-06 DIAGNOSIS — F339 Major depressive disorder, recurrent, unspecified: Secondary | ICD-10-CM

## 2022-07-06 DIAGNOSIS — I2510 Atherosclerotic heart disease of native coronary artery without angina pectoris: Secondary | ICD-10-CM | POA: Diagnosis not present

## 2022-07-06 DIAGNOSIS — Z880 Allergy status to penicillin: Secondary | ICD-10-CM

## 2022-07-06 DIAGNOSIS — E861 Hypovolemia: Principal | ICD-10-CM | POA: Diagnosis present

## 2022-07-06 DIAGNOSIS — Z800 Family history of malignant neoplasm of digestive organs: Secondary | ICD-10-CM

## 2022-07-06 DIAGNOSIS — M19012 Primary osteoarthritis, left shoulder: Secondary | ICD-10-CM | POA: Diagnosis not present

## 2022-07-06 DIAGNOSIS — R079 Chest pain, unspecified: Secondary | ICD-10-CM | POA: Diagnosis not present

## 2022-07-06 DIAGNOSIS — M6281 Muscle weakness (generalized): Secondary | ICD-10-CM | POA: Diagnosis not present

## 2022-07-06 DIAGNOSIS — E1142 Type 2 diabetes mellitus with diabetic polyneuropathy: Secondary | ICD-10-CM | POA: Diagnosis not present

## 2022-07-06 LAB — URINALYSIS, ROUTINE W REFLEX MICROSCOPIC
Bilirubin Urine: NEGATIVE
Glucose, UA: NEGATIVE mg/dL
Hgb urine dipstick: NEGATIVE
Ketones, ur: 20 mg/dL — AB
Leukocytes,Ua: NEGATIVE
Nitrite: NEGATIVE
Protein, ur: NEGATIVE mg/dL
Specific Gravity, Urine: 1.033 — ABNORMAL HIGH (ref 1.005–1.030)
pH: 7 (ref 5.0–8.0)

## 2022-07-06 LAB — CBC
HCT: 40.1 % (ref 36.0–46.0)
Hemoglobin: 12.3 g/dL (ref 12.0–15.0)
MCH: 28.2 pg (ref 26.0–34.0)
MCHC: 30.7 g/dL (ref 30.0–36.0)
MCV: 92 fL (ref 80.0–100.0)
Platelets: 220 10*3/uL (ref 150–400)
RBC: 4.36 MIL/uL (ref 3.87–5.11)
RDW: 12.8 % (ref 11.5–15.5)
WBC: 6.6 10*3/uL (ref 4.0–10.5)
nRBC: 0 % (ref 0.0–0.2)

## 2022-07-06 LAB — BASIC METABOLIC PANEL
Anion gap: 10 (ref 5–15)
BUN: 10 mg/dL (ref 8–23)
CO2: 26 mmol/L (ref 22–32)
Calcium: 9.1 mg/dL (ref 8.9–10.3)
Chloride: 101 mmol/L (ref 98–111)
Creatinine, Ser: 1.07 mg/dL — ABNORMAL HIGH (ref 0.44–1.00)
GFR, Estimated: 54 mL/min — ABNORMAL LOW (ref >60)
Glucose, Bld: 85 mg/dL (ref 70–99)
Potassium: 3.9 mmol/L (ref 3.5–5.1)
Sodium: 137 mmol/L (ref 135–145)

## 2022-07-06 LAB — HEPATIC FUNCTION PANEL
ALT: 12 U/L (ref 0–44)
AST: 17 U/L (ref 15–41)
Albumin: 3.5 g/dL (ref 3.5–5.0)
Alkaline Phosphatase: 92 U/L (ref 38–126)
Bilirubin, Direct: 0.1 mg/dL (ref 0.0–0.2)
Indirect Bilirubin: 0.4 mg/dL (ref 0.3–0.9)
Total Bilirubin: 0.5 mg/dL (ref 0.3–1.2)
Total Protein: 7.3 g/dL (ref 6.5–8.1)

## 2022-07-06 LAB — TROPONIN I (HIGH SENSITIVITY)
Troponin I (High Sensitivity): 8 ng/L (ref <18)
Troponin I (High Sensitivity): 6 ng/L (ref <18)

## 2022-07-06 MED ORDER — KETOROLAC TROMETHAMINE 15 MG/ML IJ SOLN
15.0000 mg | INTRAMUSCULAR | Status: AC
  Administered 2022-07-06: 15 mg via INTRAVENOUS
  Filled 2022-07-06: qty 1

## 2022-07-06 MED ORDER — LISINOPRIL 20 MG PO TABS
40.0000 mg | ORAL_TABLET | Freq: Every morning | ORAL | Status: DC
  Administered 2022-07-07 – 2022-07-11 (×5): 40 mg via ORAL
  Filled 2022-07-06 (×5): qty 2

## 2022-07-06 MED ORDER — HYDRALAZINE HCL 20 MG/ML IJ SOLN: 10.0000 mg | Freq: Once | INTRAMUSCULAR | Status: DC

## 2022-07-06 MED ORDER — MECLIZINE HCL 25 MG PO TABS
25.0000 mg | ORAL_TABLET | Freq: Once | ORAL | Status: AC
  Administered 2022-07-06: 25 mg via ORAL
  Filled 2022-07-06: qty 1

## 2022-07-06 MED ORDER — ALBUTEROL SULFATE HFA 108 (90 BASE) MCG/ACT IN AERS: 1.0000 | INHALATION_SPRAY | Freq: Four times a day (QID) | RESPIRATORY_TRACT | Status: DC | PRN

## 2022-07-06 MED ORDER — TRAMADOL HCL 50 MG PO TABS
50.0000 mg | ORAL_TABLET | Freq: Three times a day (TID) | ORAL | Status: DC
  Administered 2022-07-06 – 2022-07-11 (×16): 50 mg via ORAL
  Filled 2022-07-06 (×16): qty 1

## 2022-07-06 MED ORDER — HYDRALAZINE HCL 20 MG/ML IJ SOLN
10.0000 mg | Freq: Once | INTRAMUSCULAR | Status: AC
  Administered 2022-07-06: 10 mg via INTRAVENOUS
  Filled 2022-07-06: qty 1

## 2022-07-06 MED ORDER — IOHEXOL 350 MG/ML SOLN
100.0000 mL | Freq: Once | INTRAVENOUS | Status: AC | PRN
  Administered 2022-07-06: 100 mL via INTRAVENOUS

## 2022-07-06 MED ORDER — ALLOPURINOL 100 MG PO TABS
100.0000 mg | ORAL_TABLET | Freq: Every day | ORAL | Status: DC
  Administered 2022-07-06 – 2022-07-11 (×6): 100 mg via ORAL
  Filled 2022-07-06 (×6): qty 1

## 2022-07-06 MED ORDER — ALBUTEROL SULFATE (2.5 MG/3ML) 0.083% IN NEBU: 2.5000 mg | INHALATION_SOLUTION | Freq: Four times a day (QID) | RESPIRATORY_TRACT | Status: DC | PRN

## 2022-07-06 MED ORDER — FUROSEMIDE 20 MG PO TABS
10.0000 mg | ORAL_TABLET | Freq: Every day | ORAL | Status: DC
  Administered 2022-07-07 – 2022-07-10 (×4): 10 mg via ORAL
  Filled 2022-07-06 (×4): qty 1

## 2022-07-06 MED ORDER — POLYETHYLENE GLYCOL 3350 17 G PO PACK: 17.0000 g | PACK | Freq: Every day | ORAL | Status: DC | PRN

## 2022-07-06 MED ORDER — NAPHAZOLINE-PHENIRAMINE 0.025-0.3 % OP SOLN: 1.0000 [drp] | Freq: Three times a day (TID) | OPHTHALMIC | Status: DC | PRN

## 2022-07-06 MED ORDER — ASPIRIN 81 MG PO TBEC
81.0000 mg | DELAYED_RELEASE_TABLET | Freq: Every day | ORAL | Status: DC
  Administered 2022-07-06 – 2022-07-11 (×6): 81 mg via ORAL
  Filled 2022-07-06 (×6): qty 1

## 2022-07-06 MED ORDER — PROCHLORPERAZINE EDISYLATE 10 MG/2ML IJ SOLN: 5.0000 mg | Freq: Four times a day (QID) | INTRAMUSCULAR | Status: DC | PRN

## 2022-07-06 MED ORDER — SUMATRIPTAN SUCCINATE 25 MG PO TABS
25.0000 mg | ORAL_TABLET | ORAL | Status: DC | PRN
  Administered 2022-07-08: 25 mg via ORAL
  Filled 2022-07-06 (×2): qty 1

## 2022-07-06 MED ORDER — PANTOPRAZOLE SODIUM 40 MG PO TBEC
40.0000 mg | DELAYED_RELEASE_TABLET | Freq: Every day | ORAL | Status: DC
  Administered 2022-07-06 – 2022-07-11 (×6): 40 mg via ORAL
  Filled 2022-07-06 (×6): qty 1

## 2022-07-06 MED ORDER — VITAMIN D 25 MCG (1000 UNIT) PO TABS
1000.0000 [IU] | ORAL_TABLET | Freq: Every day | ORAL | Status: DC
  Administered 2022-07-06 – 2022-07-11 (×6): 1000 [IU] via ORAL
  Filled 2022-07-06 (×6): qty 1

## 2022-07-06 MED ORDER — ACETAMINOPHEN 325 MG PO TABS
650.0000 mg | ORAL_TABLET | Freq: Four times a day (QID) | ORAL | Status: DC | PRN
  Administered 2022-07-08 (×2): 650 mg via ORAL
  Filled 2022-07-06 (×3): qty 2

## 2022-07-06 MED ORDER — RIMEGEPANT SULFATE 75 MG PO TBDP: 75.0000 mg | ORAL_TABLET | ORAL | Status: DC | PRN

## 2022-07-06 MED ORDER — PREGABALIN 75 MG PO CAPS
150.0000 mg | ORAL_CAPSULE | Freq: Two times a day (BID) | ORAL | Status: DC
  Administered 2022-07-06 – 2022-07-11 (×11): 150 mg via ORAL
  Filled 2022-07-06 (×11): qty 2

## 2022-07-06 MED ORDER — LORATADINE 10 MG PO TABS
10.0000 mg | ORAL_TABLET | Freq: Every day | ORAL | Status: DC
  Administered 2022-07-07 – 2022-07-11 (×5): 10 mg via ORAL
  Filled 2022-07-06 (×5): qty 1

## 2022-07-06 MED ORDER — ADULT MULTIVITAMIN W/MINERALS CH
1.0000 | ORAL_TABLET | Freq: Every day | ORAL | Status: DC
  Administered 2022-07-06 – 2022-07-11 (×6): 1 via ORAL
  Filled 2022-07-06 (×6): qty 1

## 2022-07-06 MED ORDER — ATORVASTATIN CALCIUM 10 MG PO TABS
10.0000 mg | ORAL_TABLET | Freq: Every day | ORAL | Status: DC
  Administered 2022-07-06 – 2022-07-11 (×6): 10 mg via ORAL
  Filled 2022-07-06 (×6): qty 1

## 2022-07-06 MED ORDER — AMLODIPINE BESYLATE 5 MG PO TABS
5.0000 mg | ORAL_TABLET | Freq: Every day | ORAL | Status: DC
  Administered 2022-07-06: 5 mg via ORAL
  Filled 2022-07-06: qty 1

## 2022-07-06 MED ORDER — MORPHINE SULFATE (PF) 4 MG/ML IV SOLN
4.0000 mg | Freq: Once | INTRAVENOUS | Status: AC
  Administered 2022-07-06: 4 mg via INTRAVENOUS
  Filled 2022-07-06: qty 1

## 2022-07-06 MED ORDER — MELATONIN 5 MG PO TABS
5.0000 mg | ORAL_TABLET | Freq: Every evening | ORAL | Status: DC | PRN
  Administered 2022-07-07 – 2022-07-09 (×3): 5 mg via ORAL
  Filled 2022-07-06 (×3): qty 1

## 2022-07-06 MED ORDER — ESCITALOPRAM OXALATE 10 MG PO TABS
10.0000 mg | ORAL_TABLET | Freq: Every day | ORAL | Status: DC
  Administered 2022-07-06 – 2022-07-07 (×2): 10 mg via ORAL
  Filled 2022-07-06 (×2): qty 1

## 2022-07-06 MED ORDER — MOMETASONE FURO-FORMOTEROL FUM 200-5 MCG/ACT IN AERO
2.0000 | INHALATION_SPRAY | Freq: Two times a day (BID) | RESPIRATORY_TRACT | Status: DC
  Administered 2022-07-07 – 2022-07-11 (×10): 2 via RESPIRATORY_TRACT
  Filled 2022-07-06: qty 8.8

## 2022-07-06 MED ORDER — ENOXAPARIN SODIUM 40 MG/0.4ML IJ SOSY
40.0000 mg | PREFILLED_SYRINGE | INTRAMUSCULAR | Status: DC
  Administered 2022-07-07 – 2022-07-11 (×5): 40 mg via SUBCUTANEOUS
  Filled 2022-07-06 (×5): qty 0.4

## 2022-07-06 NOTE — ED Notes (Signed)
ED TO INPATIENT HANDOFF REPORT  ED Nurse Name and Phone #: Lowanda Foster [phone]  S Name/Age/Gender Sandra Brennan 77 y.o. female Room/Bed: RESUSC/RESUSC  Code Status   Code Status: Prior  Home/SNF/Other Home Patient oriented to: self, place, time, and situation Is this baseline? Yes   Triage Complete: Triage complete  Chief Complaint Near syncope [R55]  Triage Note Patient states that she feels as if she is gonna pass out. Complains of chest soreness and right neck soreness. Patient alert and oriented and reports that this started last night. Patient appears weak   Allergies Allergies  Allergen Reactions   Cymbalta [Duloxetine Hcl] Anaphylaxis   Pineapple Shortness Of Breath   Sulfa Antibiotics Hives, Shortness Of Breath and Itching    REACTION: unknown   Acetaminophen     Other Reaction(s): Unknown   Influenza Vaccines Hives and Itching   Penicillins Hives   Theophyllines Hives    Level of Care/Admitting Diagnosis ED Disposition     ED Disposition  Admit   Condition  --   Comment  Hospital Area: [facility] [[id number]]  Level of Care: Telemetry Medical [104]  May admit patient to [facility] or [facility] if equivalent level of care is available:: Yes  Covid Evaluation: Asymptomatic - no recent exposure (last 10 days) testing not required  Diagnosis: Near syncope [phone]  Admitting Physician: Darlin Drop [[id number]]  Attending Physician: Darlin Drop [[id number]]  Certification:: I certify this patient will need inpatient services for at least 2 midnights  Estimated Length of Stay: 2          B Medical/Surgery History Past Medical History:  Diagnosis Date   Acute nontraumatic kidney injury (HCC) 05/05/2013   Acute posthemorrhagic anemia 09/06/2012   Anemia    Arthritis    "plenty" (August 18, 2012)   Asthma    Chest pain 03/30/2016   Chronic lower back pain    "they say I need a whole new spinal column" (08-18-12)   COPD (chronic  obstructive pulmonary disease) (HCC)    Degenerative arthritis    "all over" (08-18-12)   Fx ankle    GERD (gastroesophageal reflux disease)    Gout 05/05/2013   Hypertension    Migraines    "used to; totally stopped when I quit drinking 30 yr ago" (08/18/12)   Myalgia and myositis    Myocardial infarction [facility]) 1992   Aug 18, 2012 "mild MI when son died "   Obesity    Osteoporosis    "all over" (2012-08-18)   Shortness of breath    when stressed.   Sleep apnea    "never did fix my machine; haven't used one for 5 years; I've lost 116# since then; no problems now" (08-18-2012)   Syncope 03/30/2016   Type II diabetes mellitus (HCC)    "borderline; don't test; take Metformin" (08/18/12)   Past Surgical History:  Procedure Laterality Date   ABDOMINAL HYSTERECTOMY  1990's   GANGLION CYST EXCISION Right 1980's   "wrist" (2012/08/18)   JOINT REPLACEMENT     KNEE LIGAMENT RECONSTRUCTION Right 1980's   TONSILLECTOMY  ~ 1954   TOTAL HIP ARTHROPLASTY Left 2012/08/18   TOTAL HIP ARTHROPLASTY Left 2012-08-18   Procedure: TOTAL HIP ARTHROPLASTY;  Surgeon: [name], MD;  Location: [facility] OR;  Service: Orthopedics;  Laterality: Left;  Left Total Hip Arthroplasty   TOTAL HIP ARTHROPLASTY Left 08/28/2012   Procedure: Irrigation and Debridement hip ;  Surgeon: Kathryne Hitch, MD;  Location: [facility] OR;  Service: Orthopedics;  Laterality: Left;   TOTAL KNEE ARTHROPLASTY Left 2001   TOTAL KNEE ARTHROPLASTY Right 2004     A IV Location/Drains/Wounds Patient Lines/Drains/Airways Status     Active Line/Drains/Airways     Name Placement date Placement time Site Days   Peripheral IV 07/06/22 20 G Anterior;Left;Proximal Forearm 07/06/22  1759  Forearm  less than 1            Intake/Output Last 24 hours No intake or output data in the 24 hours ending 07/06/22 2012  Labs/Imaging Results for orders placed or performed during the hospital encounter of 07/06/22 (from the past 48 hour(s))  Basic  metabolic panel     Status: Abnormal   Collection Time: 07/06/22  2:25 PM  Result Value Ref Range   Sodium 137 135 - 145 mmol/L   Potassium 3.9 3.5 - 5.1 mmol/L   Chloride 101 98 - 111 mmol/L   CO2 26 22 - 32 mmol/L   Glucose, Bld 85 70 - 99 mg/dL    Comment: Glucose reference range applies only to samples taken after fasting for at least 8 hours.   BUN 10 8 - 23 mg/dL   Creatinine, Ser 2.95 (H) 0.44 - 1.00 mg/dL   Calcium 9.1 8.9 - 62.1 mg/dL   GFR, Estimated 54 (L) >60 mL/min    Comment: (NOTE) Calculated using the CKD-EPI Creatinine Equation (2021)    Anion gap 10 5 - 15    Comment: Performed at [facility], [address] [place], [place] [address]  CBC     Status: None   Collection Time: 07/06/22  2:25 PM  Result Value Ref Range   WBC 6.6 4.0 - 10.5 K/uL   RBC 4.36 3.87 - 5.11 MIL/uL   Hemoglobin 12.3 12.0 - 15.0 g/dL   HCT 78.4 69.6 - 29.5 %   MCV 92.0 80.0 - 100.0 fL   MCH 28.2 26.0 - 34.0 pg   MCHC 30.7 30.0 - 36.0 g/dL   RDW 28.4 13.2 - 44.0 %   Platelets 220 150 - 400 K/uL   nRBC 0.0 0.0 - 0.2 %    Comment: Performed at [facility], [address] [place], [place] [address]  Troponin I (High Sensitivity)     Status: None   Collection Time: 07/06/22  2:25 PM  Result Value Ref Range   Troponin I (High Sensitivity) 8 <18 ng/L    Comment: (NOTE) Elevated high sensitivity troponin I (hsTnI) values and significant  changes across serial measurements may suggest ACS but many other  chronic and acute conditions are known to elevate hsTnI results.  Refer to the "Links" section for chest pain algorithms and additional  guidance. Performed at [facility], [address], [place], [place] [address]   Hepatic function panel     Status: None   Collection Time: 07/06/22  2:25 PM  Result Value Ref Range   Total Protein 7.3 6.5 - 8.1 g/dL   Albumin 3.5 3.5 - 5.0 g/dL   AST 17 15 - 41 U/L   ALT 12 0 - 44 U/L   Alkaline Phosphatase 92 38 - 126 U/L    Total Bilirubin 0.5 0.3 - 1.2 mg/dL   Bilirubin, Direct 0.1 0.0 - 0.2 mg/dL   Indirect Bilirubin 0.4 0.3 - 0.9 mg/dL    Comment: Performed at [facility], [address] [place], [place] [address]  Troponin I (High Sensitivity)  Status: None   Collection Time: 07/06/22  6:05 PM  Result Value Ref Range   Troponin I (High Sensitivity) 6 <18 ng/L    Comment: (NOTE) Elevated high sensitivity troponin I (hsTnI) values and significant  changes across serial measurements may suggest ACS but many other  chronic and acute conditions are known to elevate hsTnI results.  Refer to the "Links" section for chest pain algorithms and additional  guidance. Performed at [facility], [address], [place], [place] [address]    CT Angio Chest/Abd/Pel for Dissection W and/or Wo Contrast  Result Date: 07/06/2022 CLINICAL DATA:  Acute aortic syndrome (AAS) suspected EXAM: CT ANGIOGRAPHY CHEST, ABDOMEN AND PELVIS TECHNIQUE: Non-contrast CT of the chest was initially obtained. Multidetector CT imaging through the chest, abdomen and pelvis was performed using the standard protocol during bolus administration of intravenous contrast. Multiplanar reconstructed images and MIPs were obtained and reviewed to evaluate the vascular anatomy. RADIATION DOSE REDUCTION: This exam was performed according to the departmental dose-optimization program which includes automated exposure control, adjustment of the mA and/or kV according to patient size and/or use of iterative reconstruction technique. CONTRAST:  OMNIPAQUE IOHEXOL 350 MG/ML SOLN COMPARISON:  01/05/2021 FINDINGS: CTA CHEST FINDINGS Cardiovascular: Heart is normal size. Aorta is normal caliber. No dissection. No filling defects in the pulmonary arteries to suggest pulmonary emboli. Mediastinum/Nodes: No mediastinal, hilar, or axillary adenopathy. Trachea and esophagus are unremarkable. Thyroid unremarkable. Lungs/Pleura: Lungs are clear. No focal  airspace opacities or suspicious nodules. No effusions. Musculoskeletal: Chest wall soft tissues are unremarkable. No acute bony abnormality. Review of the MIP images confirms the above findings. CTA ABDOMEN AND PELVIS FINDINGS VASCULAR Aorta: Normal caliber aorta without aneurysm, dissection, vasculitis or significant stenosis. Infrarenal atherosclerosis and tortuosity. Celiac: Patent without evidence of aneurysm, dissection, vasculitis or significant stenosis. SMA: Patent without evidence of aneurysm, dissection, vasculitis or significant stenosis. Renals: Both renal arteries are patent without evidence of aneurysm, dissection, vasculitis, fibromuscular dysplasia or significant stenosis. IMA: Patent without evidence of aneurysm, dissection, vasculitis or significant stenosis. Inflow: Patent without evidence of aneurysm, dissection, vasculitis or significant stenosis. Veins: No obvious venous abnormality within the limitations of this arterial phase study. Review of the MIP images confirms the above findings. NON-VASCULAR Hepatobiliary: Small layering gallstones within the gallbladder. No focal hepatic abnormality. Pancreas: No focal abnormality or ductal dilatation. Spleen: No focal abnormality.  Normal size. Adrenals/Urinary Tract: No suspicious renal or adrenal mass. No stones or hydronephrosis. Urinary bladder decompressed and obscured by beam hardening artifact from the left hip replacement. Stomach/Bowel: Left colonic diverticulosis. No active diverticulitis. Stomach and small bowel decompressed. No bowel obstruction. Lymphatic: No adenopathy Reproductive: Lower pelvic structures obscured by beam hardening artifact from left hip replacement. Other: No free fluid or free air. Musculoskeletal: No acute bony abnormality. Left hip replacement. Degenerative changes in the lumbar spine. Review of the MIP images confirms the above findings. IMPRESSION: No evidence of aortic aneurysm or dissection. Atherosclerotic  calcifications in the infrarenal aorta. No evidence of pulmonary embolus. No acute findings in the chest, abdomen or pelvis. Cholelithiasis. Left colonic diverticulosis. Electronically Signed   By: Charlett Nose M.D.   On: 07/06/2022 19:10   CT HEAD WO CONTRAST ( )  Result Date: 07/06/2022 CLINICAL DATA:  Headache. EXAM: CT HEAD WITHOUT CONTRAST TECHNIQUE: Contiguous axial images were obtained from the base of the skull through the vertex without intravenous contrast. RADIATION DOSE REDUCTION: This exam was performed according to the departmental dose-optimization program which includes automated exposure  control, adjustment of the mA and/or kV according to patient size and/or use of iterative reconstruction technique. COMPARISON:  Head CT dated 08/25/2021. FINDINGS: Brain: The ventricles and sulci are appropriate size for the patient's age. The gray-white matter discrimination is preserved. There is no acute intracranial hemorrhage. No mass effect or midline shift. No extra-axial fluid collection. Vascular: No hyperdense vessel or unexpected calcification. Skull: Normal. Negative for fracture or focal lesion. Sinuses/Orbits: No acute finding. Other: None IMPRESSION: No acute intracranial pathology. Electronically Signed   By: Elgie Collard M.D.   On: 07/06/2022 19:06   DG Chest Port 1 View  Result Date: 07/06/2022 CLINICAL DATA:  Chest pain, short of breath EXAM: PORTABLE CHEST 1 VIEW COMPARISON:  08/25/2021 FINDINGS: Single frontal view of the chest demonstrates stable enlargement of the cardiac silhouette. No airspace disease, effusion, or pneumothorax. Severe degenerative changes of the bilateral shoulders again noted. No acute fracture. IMPRESSION: 1. Stable enlarged cardiac silhouette. 2. No acute airspace disease. Electronically Signed   By: Sharlet Salina M.D.   On: 07/06/2022 15:02    Pending Labs Unresulted Labs (From admission, onward)     Start     Ordered   07/06/22 1601  Brain  natriuretic peptide  Once,   URGENT        07/06/22 1600   07/06/22 1421  Urinalysis, Routine w reflex microscopic -Urine, Clean Catch  Once,   URGENT       Question:  Specimen Source  Answer:  Urine, Clean Catch   07/06/22 1421            Vitals/Pain Today's Vitals   07/06/22 1755 07/06/22 1804 07/06/22 1835 07/06/22 1945  BP: (!) 178/85   (!) 166/81  Pulse: 77   76  Resp: 18   13  Temp:  98.9 F (37.2 C)    TempSrc:  Oral    SpO2: 96%   98%  PainSc:   4      Isolation Precautions No active isolations  Medications Medications  hydrALAZINE (APRESOLINE) injection 10 mg (0 mg Intravenous Hold 07/06/22 1951)  morphine (PF) 4 MG/ML injection 4 mg (4 mg Intravenous Given 07/06/22 1803)  hydrALAZINE (APRESOLINE) injection 10 mg (10 mg Intravenous Given 07/06/22 1800)  meclizine (ANTIVERT) tablet 25 mg (25 mg Oral Given 07/06/22 1802)  iohexol (OMNIPAQUE) 350 MG/ML injection 100 mL (100 mLs Intravenous Contrast Given 07/06/22 1839)    Mobility walks with device     Focused Assessments    R Recommendations: See Admitting Provider Note  Report given to:   Additional Notes:

## 2022-07-06 NOTE — H&P (Addendum)
History and Physical  Sandra Brennan RUE:[id number] DOB: March 31, 1945 DOA: 07/06/2022  Referring physician: Dr. Silverio Lay, EDP  PCP: Anne Ng, NP  Outpatient Specialists: Neurology, sleep medicine. Patient coming from: Home.    Chief Complaint: Dizziness.  HPI: Sandra Brennan is a 77 y.o. female with medical history significant for severe morbid obesity, fibromyalgia, chronic anxiety/depression, history of migraines on sumatriptan as needed, OSA on CPAP, type 2 diabetes, hypertension, hyperlipidemia, who presented to [facility] ED with complaints of dizziness lasting a few minutes and resolving spontaneously then recurring.  Onset last night.  Has had it in the past but not this severe.  Associated with nausea without vomiting.  Also endorses tinnitus and pain in both ears, left greater than right.  No subjective fevers reported.  No recent falls.  No loss of consciousness.  In the ED, the patient has positive orthostatic vital signs.  CBC was essentially unremarkable.  Renal function is at baseline.  No elevation in LFTs.  High-sensitivity troponin negative x2.  Noncontrast CT head nonacute.  CT angio chest/abdomen/pelvis for dissection with and without contrast was nonacute.  Due to persistent symptomatology in the ED with high risk for falling, EDP requested admission for further evaluation and management of recurrent near syncope.  Admitted by [facility], hospitalist service, to telemetry medical unit as inpatient status.  ED Course: Tmax 98.3.  BP 156/75, pulse 73, respiration rate 18, O2 saturation 100% on room air.  CBC and CMP essentially unremarkable except for mild elevation in creatinine 1.07, GFR 54, baseline.  Review of Systems: Review of systems as noted in the HPI. All other systems reviewed and are negative.   Past Medical History:  Diagnosis Date   Acute nontraumatic kidney injury (HCC) 05/05/2013   Acute posthemorrhagic anemia 09/06/2012   Anemia    Arthritis    "plenty" (08-14-2012)    Asthma    Chest pain 03/30/2016   Chronic lower back pain    "they say I need a whole new spinal column" (14-Aug-2012)   COPD (chronic obstructive pulmonary disease) (HCC)    Degenerative arthritis    "all over" (08-14-12)   Fx ankle    GERD (gastroesophageal reflux disease)    Gout 05/05/2013   Hypertension    Migraines    "used to; totally stopped when I quit drinking 30 yr ago" (2012/08/14)   Myalgia and myositis    Myocardial infarction [facility]) 1992   August 14, 2012 "mild MI when son died "   Obesity    Osteoporosis    "all over" (Aug 14, 2012)   Shortness of breath    when stressed.   Sleep apnea    "never did fix my machine; haven't used one for 5 years; I've lost 116# since then; no problems now" (08-14-2012)   Syncope 03/30/2016   Type II diabetes mellitus (HCC)    "borderline; don't test; take Metformin" (August 14, 2012)   Past Surgical History:  Procedure Laterality Date   ABDOMINAL HYSTERECTOMY  1990's   GANGLION CYST EXCISION Right 1980's   "wrist" (08/14/2012)   JOINT REPLACEMENT     KNEE LIGAMENT RECONSTRUCTION Right 1980's   TONSILLECTOMY  ~ 1954   TOTAL HIP ARTHROPLASTY Left August 14, 2012   TOTAL HIP ARTHROPLASTY Left 08/14/2012   Procedure: TOTAL HIP ARTHROPLASTY;  Surgeon: [name], MD;  Location: [facility] OR;  Service: Orthopedics;  Laterality: Left;  Left Total Hip Arthroplasty   TOTAL HIP ARTHROPLASTY Left 08/28/2012   Procedure: Irrigation and Debridement hip ;  Surgeon: Kathryne Hitch,  MD;  Location: [facility] OR;  Service: Orthopedics;  Laterality: Left;   TOTAL KNEE ARTHROPLASTY Left 2001   TOTAL KNEE ARTHROPLASTY Right 2004    Social History:  reports that she has quit smoking. Her smoking use included cigarettes. She has a 0.24 pack-year smoking history. She has never used smokeless tobacco. She reports that she does not drink alcohol and does not use drugs.   Allergies  Allergen Reactions   Cymbalta [Duloxetine Hcl] Anaphylaxis   Pineapple Shortness Of Breath    Sulfa Antibiotics Hives, Shortness Of Breath and Itching   Influenza Vaccines Hives and Itching   Other Swelling and Other (See Comments)    Pork barbeque- Made the face swell   Penicillins Hives   Theophyllines Hives    Family History  Problem Relation Age of Onset   Arthritis Mother    Arthritis Father    Colon cancer Father    Diabetes Sister    Arthritis Sister    Diabetes Maternal Grandmother    Arthritis Maternal Grandmother    Diabetes Maternal Grandfather    Arthritis Maternal Grandfather    Migraines Neg Hx    Sleep apnea Neg Hx       Prior to Admission medications   Medication Sig Start Date End Date Taking? Authorizing Provider  acetaminophen (TYLENOL 8 HOUR ARTHRITIS PAIN) 650 MG CR tablet Take 1,300 mg by mouth every 8 (eight) hours as needed (for headaches).   Yes [provider]  albuterol (VENTOLIN HFA) 108 (90 Base) MCG/ACT inhaler INHALE 1 PUFF INTO THE LUNGS EVERY 6 (SIX) HOURS AS NEEDED FOR SHORTNESS OF BREATH OR WHEEZING. 04/16/22  Yes Nche, Bonna Gains, NP  allopurinol (ZYLOPRIM) 100 MG tablet TAKE 1 TABLET BY MOUTH EVERY DAY 07/03/22  Yes Nche, Bonna Gains, NP  amLODipine (NORVASC) 5 MG tablet Take 1 tablet (5 mg total) by mouth every evening. Patient taking differently: Take 5 mg by mouth at bedtime. 06/04/22  Yes Nche, Bonna Gains, NP  aspirin EC 81 MG tablet Take 81 mg by mouth daily.   Yes [provider]  atorvastatin (LIPITOR) 10 MG tablet TAKE 1 TABLET BY MOUTH  DAILY AT 6 PM. Patient taking differently: Take 10 mg by mouth daily at 6 PM. 09/02/21  Yes Nche, Bonna Gains, NP  cetirizine (ZYRTEC) 10 MG tablet Take 10 mg by mouth in the morning.   Yes [provider]  cholecalciferol (VITAMIN D3) 25 MCG (1000 UNIT) tablet Take 1,000 Units by mouth daily.   Yes [provider]  furosemide (LASIX) 20 MG tablet TAKE 1/2 TABLET BY MOUTH DAILY 07/03/22  Yes Nche, Bonna Gains, NP  lisinopril (ZESTRIL) 40 MG tablet TAKE 1  TABLET BY MOUTH EVERY DAY Patient taking differently: Take 40 mg by mouth in the morning. 05/28/22  Yes Nche, Bonna Gains, NP  Multiple Vitamin (MULTIVITAMIN) tablet Take 1 tablet by mouth daily with breakfast.   Yes [provider]  omeprazole (PRILOSEC) 20 MG capsule TAKE 1 CAPSULE BY MOUTH EVERY DAY Patient taking differently: Take 20 mg by mouth daily before breakfast. 02/21/22  Yes Nche, Bonna Gains, NP  OPCON-A 0.027-0.315 % SOLN Place 1 drop into both eyes 3 (three) times daily as needed (for itching).   Yes [provider]  pregabalin (LYRICA) 150 MG capsule TAKE 1 CAPSULE BY MOUTH TWICE A DAY Patient taking differently: Take 150 mg by mouth in the morning and at bedtime. 07/03/22  Yes Jones Bales, NP  Rimegepant Sulfate (NURTEC) 75  MG TBDP Take 1 tablet (75 mg total) by mouth as needed (take 1 at onset of headache, max is 75 in 24 hours). Patient taking differently: Take 75 mg by mouth as needed (at the onset of a headache- max daily dose is 75 mg/24 hours). 06/25/22  Yes Butch Penny, NP  Semaglutide,0.25 or 0.5MG /DOS, (OZEMPIC, 0.25 OR 0.5 MG/DOSE,) 2 MG/3ML SOPN Inject 0.5 mg into the skin once a week. Patient taking differently: Inject 0.5 mg into the skin every Tuesday. 04/29/22  Yes Nche, Bonna Gains, NP  SYMBICORT 160-4.5 MCG/ACT inhaler INHALE 2 PUFFS INTO THE LUNGS IN THE MORNING AND AT BEDTIME. RINSE MOUTH AFTER EACH USE Patient taking differently: Inhale 1 puff into the lungs in the morning and at bedtime. Rinse mouth after each use 07/03/22  Yes Nche, Bonna Gains, NP  traMADol (ULTRAM) 50 MG tablet Take 1 tablet (50 mg total) by mouth 3 (three) times daily. 01/15/22  Yes Jones Bales, NP  UNKNOWN TO PATIENT Take 1 tablet by mouth See admin instructions. Unnamed vegetable-based OTC stool softener- Take 1 tablet by mouth once a day   Yes [provider]  escitalopram (LEXAPRO) 10 MG tablet TAKE 1 TABLET BY MOUTH EVERY DAY Patient not taking:  Reported on 07/06/2022 06/30/22   Nche, Bonna Gains, NP    Physical Exam: BP (!) 168/70   Pulse 72   Temp 98.9 F (37.2 C) (Oral)   Resp 13   SpO2 98%   General: 77 y.o. year-old female well developed well nourished in no acute distress.  Alert and oriented x3. Cardiovascular: Regular rate and rhythm with no rubs or gallops.  No thyromegaly or JVD noted.  No lower extremity edema. 2/4 pulses in all 4 extremities. Respiratory: Clear to auscultation with no wheezes or rales. Good inspiratory effort. Abdomen: Soft nontender nondistended with normal bowel sounds x4 quadrants. Muskuloskeletal: No cyanosis, clubbing or edema noted bilaterally Neuro: CN II-XII intact, strength, sensation, reflexes Skin: No ulcerative lesions noted or rashes Psychiatry: Judgement and insight appear normal. Mood is appropriate for condition and setting          Labs on Admission:  Basic Metabolic Panel: Recent Labs  Lab 07/06/22 1425  NA 137  K 3.9  CL 101  CO2 26  GLUCOSE 85  BUN 10  CREATININE 1.07*  CALCIUM 9.1   Liver Function Tests: Recent Labs  Lab 07/06/22 1425  AST 17  ALT 12  ALKPHOS 92  BILITOT 0.5  PROT 7.3  ALBUMIN 3.5   No results for input(s): "LIPASE", "AMYLASE" in the last 168 hours. No results for input(s): "AMMONIA" in the last 168 hours. CBC: Recent Labs  Lab 07/06/22 1425  WBC 6.6  HGB 12.3  HCT 40.1  MCV 92.0  PLT 220   Cardiac Enzymes: No results for input(s): "CKTOTAL", "CKMB", "CKMBINDEX", "TROPONINI" in the last 168 hours.  BNP (last 3 results) Recent Labs    08/25/21 0740  BNP 36.2    ProBNP (last 3 results) No results for input(s): "PROBNP" in the last 8760 hours.  CBG: No results for input(s): "GLUCAP" in the last 168 hours.  Radiological Exams on Admission: CT Angio Chest/Abd/Pel for Dissection W and/or Wo Contrast  Result Date: 07/06/2022 CLINICAL DATA:  Acute aortic syndrome (AAS) suspected EXAM: CT ANGIOGRAPHY CHEST, ABDOMEN AND PELVIS  TECHNIQUE: Non-contrast CT of the chest was initially obtained. Multidetector CT imaging through the chest, abdomen and pelvis was performed using the standard protocol during bolus administration of intravenous  contrast. Multiplanar reconstructed images and MIPs were obtained and reviewed to evaluate the vascular anatomy. RADIATION DOSE REDUCTION: This exam was performed according to the departmental dose-optimization program which includes automated exposure control, adjustment of the mA and/or kV according to patient size and/or use of iterative reconstruction technique. CONTRAST:  OMNIPAQUE IOHEXOL 350 MG/ML SOLN COMPARISON:  01/05/2021 FINDINGS: CTA CHEST FINDINGS Cardiovascular: Heart is normal size. Aorta is normal caliber. No dissection. No filling defects in the pulmonary arteries to suggest pulmonary emboli. Mediastinum/Nodes: No mediastinal, hilar, or axillary adenopathy. Trachea and esophagus are unremarkable. Thyroid unremarkable. Lungs/Pleura: Lungs are clear. No focal airspace opacities or suspicious nodules. No effusions. Musculoskeletal: Chest wall soft tissues are unremarkable. No acute bony abnormality. Review of the MIP images confirms the above findings. CTA ABDOMEN AND PELVIS FINDINGS VASCULAR Aorta: Normal caliber aorta without aneurysm, dissection, vasculitis or significant stenosis. Infrarenal atherosclerosis and tortuosity. Celiac: Patent without evidence of aneurysm, dissection, vasculitis or significant stenosis. SMA: Patent without evidence of aneurysm, dissection, vasculitis or significant stenosis. Renals: Both renal arteries are patent without evidence of aneurysm, dissection, vasculitis, fibromuscular dysplasia or significant stenosis. IMA: Patent without evidence of aneurysm, dissection, vasculitis or significant stenosis. Inflow: Patent without evidence of aneurysm, dissection, vasculitis or significant stenosis. Veins: No obvious venous abnormality within the limitations of  this arterial phase study. Review of the MIP images confirms the above findings. NON-VASCULAR Hepatobiliary: Small layering gallstones within the gallbladder. No focal hepatic abnormality. Pancreas: No focal abnormality or ductal dilatation. Spleen: No focal abnormality.  Normal size. Adrenals/Urinary Tract: No suspicious renal or adrenal mass. No stones or hydronephrosis. Urinary bladder decompressed and obscured by beam hardening artifact from the left hip replacement. Stomach/Bowel: Left colonic diverticulosis. No active diverticulitis. Stomach and small bowel decompressed. No bowel obstruction. Lymphatic: No adenopathy Reproductive: Lower pelvic structures obscured by beam hardening artifact from left hip replacement. Other: No free fluid or free air. Musculoskeletal: No acute bony abnormality. Left hip replacement. Degenerative changes in the lumbar spine. Review of the MIP images confirms the above findings. IMPRESSION: No evidence of aortic aneurysm or dissection. Atherosclerotic calcifications in the infrarenal aorta. No evidence of pulmonary embolus. No acute findings in the chest, abdomen or pelvis. Cholelithiasis. Left colonic diverticulosis. Electronically Signed   By: Charlett Nose M.D.   On: 07/06/2022 19:10   CT HEAD WO CONTRAST ( )  Result Date: 07/06/2022 CLINICAL DATA:  Headache. EXAM: CT HEAD WITHOUT CONTRAST TECHNIQUE: Contiguous axial images were obtained from the base of the skull through the vertex without intravenous contrast. RADIATION DOSE REDUCTION: This exam was performed according to the departmental dose-optimization program which includes automated exposure control, adjustment of the mA and/or kV according to patient size and/or use of iterative reconstruction technique. COMPARISON:  Head CT dated 08/25/2021. FINDINGS: Brain: The ventricles and sulci are appropriate size for the patient's age. The gray-white matter discrimination is preserved. There is no acute intracranial  hemorrhage. No mass effect or midline shift. No extra-axial fluid collection. Vascular: No hyperdense vessel or unexpected calcification. Skull: Normal. Negative for fracture or focal lesion. Sinuses/Orbits: No acute finding. Other: None IMPRESSION: No acute intracranial pathology. Electronically Signed   By: Elgie Collard M.D.   On: 07/06/2022 19:06   DG Chest Port 1 View  Result Date: 07/06/2022 CLINICAL DATA:  Chest pain, short of breath EXAM: PORTABLE CHEST 1 VIEW COMPARISON:  08/25/2021 FINDINGS: Single frontal view of the chest demonstrates stable enlargement of the cardiac silhouette. No airspace disease, effusion, or pneumothorax. Severe degenerative  changes of the bilateral shoulders again noted. No acute fracture. IMPRESSION: 1. Stable enlarged cardiac silhouette. 2. No acute airspace disease. Electronically Signed   By: Sharlet Salina M.D.   On: 07/06/2022 15:02    EKG: I independently viewed the EKG done and my findings are as followed: Normal sinus rhythm rate of 75.  Nonspecific ST-T changes.  QTc 473  Assessment/Plan Present on Admission:  Near syncope  Principal Problem:   Near syncope  Near syncope, recurrent, unclear etiology, POA Presents with recurrent dizziness, feels like she is going to pass out, lasting a few minutes and resolving spontaneously and recurring. Associated with nausea without vomiting, bilateral tinnitus and bilateral ear pain.  No subjective fevers reported. PT OT consult for Dix-Hallpike maneuver, to rule out BPPV Will need to follow-up with ENT for hearing loss evaluation to rule out Mnire's disease.   Resume home p.o. Lasix. Closely monitor vital signs  CKD 3A Baseline creatinine 1.07 with GFR 54 Avoid nephrotoxic agents and hypotension Monitor urine output Repeat BMP in the morning.  Severe morbid obesity BMI 53 Recommend weight loss outpatient with regular physical activity and healthy dieting.  Fibromyalgia Resume home  regimen.  Hypertension Resume home oral antihypertensives Monitor vital signs  GERD Resume home PPI  Hyperlipidemia Resume home Lipitor  Gout Resume home allopurinol.  History of migraines Resume home sumatriptan as needed  Chronic diastolic CHF Euvolemic on exam Resume home cardiac medications Strict I's and O's and daily weight  OSA Resume CPAP nightly   DVT prophylaxis: Subcu Lovenox daily  Code Status: Full code  Family Communication: None at bedside.  Disposition Plan: Admitted to telemetry medical unit  Consults called: None.  Admission status: Inpatient status.   Status is: Inpatient The patient requires at least 2 midnights for further evaluation and treatment of present condition.   Darlin Drop MD [facility] Pager [phone]  If 7PM-7AM, please contact night-coverage [email or web address] Password [email or web address]  07/06/2022, 10:03 PM

## 2022-07-06 NOTE — ED Provider Notes (Signed)
[place] EMERGENCY DEPARTMENT AT [facility] Provider Note   CSN: [id number] Arrival date & time: 07/06/22  1317     History  No chief complaint on file.   Sandra Brennan is a 77 y.o. female history of lumbar stenosis, fibromyalgia, neuropathic pain, here presenting with chest pain and hypertension and dizziness.  Patient states that she had a couple episodes of near syncope since yesterday.  She states that she just feels very lightheaded and dizzy when she stands up.  She also has right-sided chest pain as well.  Patient states that she is hurting all over.  She has a history of fibromyalgia and neuropathic pain and is on tramadol and gabapentin.  She states that they are not controlling her pain.  She states that she recently saw PCP and put on Lexapro.  Patient also states that she has difficult to control hypertension and is on multiple BP meds.  The history is provided by the patient.       Home Medications Prior to Admission medications   Medication Sig Start Date End Date Taking? Authorizing Provider  albuterol (VENTOLIN HFA) 108 (90 Base) MCG/ACT inhaler INHALE 1 PUFF INTO THE LUNGS EVERY 6 (SIX) HOURS AS NEEDED FOR SHORTNESS OF BREATH OR WHEEZING. 04/16/22   Nche, Bonna Gains, NP  allopurinol (ZYLOPRIM) 100 MG tablet TAKE 1 TABLET BY MOUTH EVERY DAY 07/03/22   Nche, Bonna Gains, NP  amLODipine (NORVASC) 5 MG tablet Take 1 tablet (5 mg total) by mouth every evening. 06/04/22   Nche, Bonna Gains, NP  aspirin EC 81 MG tablet Take 81 mg by mouth daily.    [provider]  atorvastatin (LIPITOR) 10 MG tablet TAKE 1 TABLET BY MOUTH  DAILY AT 6 PM. 09/02/21   Nche, Bonna Gains, NP  cetirizine (ZYRTEC) 10 MG tablet Take 10 mg by mouth daily as needed.    [provider]  cholecalciferol (VITAMIN D3) 25 MCG (1000 UNIT) tablet Take 1,000 Units by mouth daily.    [provider]  escitalopram (LEXAPRO) 10 MG tablet TAKE 1 TABLET BY MOUTH EVERY DAY  06/30/22   Nche, Bonna Gains, NP  furosemide (LASIX) 20 MG tablet TAKE 1/2 TABLET BY MOUTH DAILY 07/03/22   Nche, Bonna Gains, NP  lisinopril (ZESTRIL) 40 MG tablet TAKE 1 TABLET BY MOUTH EVERY DAY 05/28/22   Nche, Bonna Gains, NP  Multiple Vitamin (MULTIVITAMIN) tablet Take 1 tablet by mouth daily.    [provider]  omeprazole (PRILOSEC) 20 MG capsule TAKE 1 CAPSULE BY MOUTH EVERY DAY 02/21/22   Nche, Bonna Gains, NP  pregabalin (LYRICA) 150 MG capsule TAKE 1 CAPSULE BY MOUTH TWICE A DAY 07/03/22   Jones Bales, NP  Rimegepant Sulfate (NURTEC) 75 MG TBDP Take 1 tablet (75 mg total) by mouth as needed (take 1 at onset of headache, max is 75 in 24 hours). 06/25/22   Butch Penny, NP  Semaglutide,0.25 or 0.5MG /DOS, (OZEMPIC, 0.25 OR 0.5 MG/DOSE,) 2 MG/3ML SOPN Inject 0.5 mg into the skin once a week. 04/29/22   Nche, Bonna Gains, NP  Sennosides 8.6 MG CAPS Take 1 capsule by mouth daily.    [provider]  SYMBICORT 160-4.5 MCG/ACT inhaler INHALE 2 PUFFS INTO THE LUNGS IN THE MORNING AND AT BEDTIME. RINSE MOUTH AFTER EACH USE 07/03/22   Nche, Bonna Gains, NP  traMADol (ULTRAM) 50 MG tablet Take 1 tablet (50 mg total) by mouth 3 (three) times daily. 01/15/22   Jacalyn Lefevre  L, NP      Allergies    Cymbalta [duloxetine hcl], Pineapple, Sulfa antibiotics, Acetaminophen, Influenza vaccines, Penicillins, and Theophyllines    Review of Systems   Review of Systems  Neurological:  Positive for dizziness and headaches.  All other systems reviewed and are negative.   Physical Exam Updated Vital Signs BP (!) 195/87   Pulse 72   Temp 98.8 F (37.1 C) (Oral)   Resp 19   SpO2 96%  Physical Exam Vitals and nursing note reviewed.  Constitutional:      Comments: Uncomfortable and chronically ill  HENT:     Head: Normocephalic.     Nose: Nose normal.     Mouth/Throat:     Mouth: Mucous membranes are moist.  Eyes:     Comments: Questionable nystagmus to the right side   Cardiovascular:     Rate and Rhythm: Normal rate and regular rhythm.     Pulses: Normal pulses.     Heart sounds: Normal heart sounds.  Pulmonary:     Effort: Pulmonary effort is normal.     Breath sounds: Normal breath sounds.     Comments: Mild diffuse tenderness of the chest and abdomen and no obvious deformity Abdominal:     General: Abdomen is flat.     Palpations: Abdomen is soft.  Musculoskeletal:        General: Normal range of motion.     Cervical back: Normal range of motion and neck supple.  Skin:    General: Skin is warm.     Capillary Refill: Capillary refill takes less than 2 seconds.  Neurological:     General: No focal deficit present.     Mental Status: She is oriented to person, place, and time.  Psychiatric:        Mood and Affect: Mood normal.        Behavior: Behavior normal.     ED Results / Procedures / Treatments   Labs (all labs ordered are listed, but only abnormal results are displayed) Labs Reviewed  BASIC METABOLIC PANEL - Abnormal; Notable for the following components:      Result Value   Creatinine, Ser 1.07 (*)    GFR, Estimated 54 (*)    All other components within normal limits  CBC  HEPATIC FUNCTION PANEL  URINALYSIS, ROUTINE W REFLEX MICROSCOPIC  BRAIN NATRIURETIC PEPTIDE  TROPONIN I (HIGH SENSITIVITY)  TROPONIN I (HIGH SENSITIVITY)    EKG EKG Interpretation  Date/Time:  Sunday Jul 06 2022 14:32:25 EDT Ventricular Rate:  75 PR Interval:  156 QRS Duration: 90 QT Interval:  424 QTC Calculation: 473 R Axis:   6 Text Interpretation: Normal sinus rhythm Nonspecific T wave abnormality Prolonged QT Abnormal ECG When compared with ECG of 25-Aug-2021 07:37, PREVIOUS ECG IS PRESENT Confirmed by Richardean Canal [phone]) on 07/06/2022 3:52:07 PM  Radiology DG Chest Port 1 View  Result Date: 07/06/2022 CLINICAL DATA:  Chest pain, short of breath EXAM: PORTABLE CHEST 1 VIEW COMPARISON:  08/25/2021 FINDINGS: Single frontal view of the chest  demonstrates stable enlargement of the cardiac silhouette. No airspace disease, effusion, or pneumothorax. Severe degenerative changes of the bilateral shoulders again noted. No acute fracture. IMPRESSION: 1. Stable enlarged cardiac silhouette. 2. No acute airspace disease. Electronically Signed   By: Sharlet Salina M.D.   On: 07/06/2022 15:02    Procedures Procedures    Angiocath insertion Performed by: Richardean Canal  Consent: Verbal consent obtained. Risks and benefits: risks, benefits and  alternatives were discussed Time out: Immediately prior to procedure a "time out" was called to verify the correct patient, procedure, equipment, support staff and site/side marked as required.  Preparation: Patient was prepped and draped in the usual sterile fashion.  Vein Location: R antecube   Ultrasound Guided  Gauge: 20 long   Normal blood return and flush without difficulty Patient tolerance: Patient tolerated the procedure well with no immediate complications.   Medications Ordered in ED Medications  morphine (PF) 4 MG/ML injection 4 mg (has no administration in time range)  hydrALAZINE (APRESOLINE) injection 10 mg (has no administration in time range)  meclizine (ANTIVERT) tablet 25 mg (has no administration in time range)    ED Course/ Medical Decision Making/ A&P                             Medical Decision Making SEVAN BUFKIN is a 77 y.o. female here presenting with hypertension and dizziness and multiple episodes of near syncope. Patient also is tender all over as I palpate her chest and abdomen.  She has history of fibromyalgia and that makes her exam very difficult.  Plan to get CT head to rule out a bleed.  Also get dissection study given that she is very hypertensive.  Will give IV hydralazine and pain medicine and reassess.  7:40 PM I reviewed patient's labs and independently interpreted CT scans.  Patient does not have a dissection.  CT head was unremarkable.  Labs  unremarkable.  Blood pressure went down to the 140s but came back up to 170s again.  We tried to ambulate patient and patient walks with a walker at baseline but her legs felt weak.  At this point, patient will be admitted for dizziness and weakness and near syncope.  Problems Addressed: Hypertensive urgency: acute illness or injury Near syncope: acute illness or injury  Amount and/or Complexity of Data Reviewed Labs: ordered. Decision-making details documented in ED Course. Radiology: ordered and independent interpretation performed. Decision-making details documented in ED Course. ECG/medicine tests: ordered and independent interpretation performed. Decision-making details documented in ED Course.  Risk Prescription drug management.    Final Clinical Impression(s) / ED Diagnoses Final diagnoses:  None    Rx / DC Orders ED Discharge Orders     None         Charlynne Pander, MD 07/06/22 1943

## 2022-07-06 NOTE — ED Provider Triage Note (Signed)
Emergency Medicine Provider Triage Evaluation Note  Sandra Brennan , a 77 y.o. female  was evaluated in triage.  Pt complains of recurrent episodes of near syncope since last night.  She also complains of associated chest tightness, shortness of breath, upper abdominal pain, and diarrhea.  No bloody stools.  No loss of consciousness or injury.  No history of this.  Also complains of a right-sided headache that goes down into her neck but no vision changes, focal weakness, or paresthesias.  Review of Systems  Positive: See HPI Negative: See HPI  Physical Exam  BP (!) 166/94 (BP Location: Right Arm)   Pulse 76   Temp 98.8 F (37.1 C) (Oral)   Resp 18   SpO2 94%  Gen:   Awake, no distress, generally weak Resp:  Normal effort lungs clear to auscultation MSK:   Moves all extremities x 4 Other:  Cranial nerves intact, nonfocal neuroexam the patient is weak all over, moderate epigastric tenderness to palpation, regular rate and rhythm  Medical Decision Making  Medically screening exam initiated at 2:21 PM.  Appropriate orders placed.  Sandra Brennan was informed that the remainder of the evaluation will be completed by another provider, this initial triage assessment does not replace that evaluation, and the importance of remaining in the ED until their evaluation is complete.     Tonette Lederer, PA-C 07/06/22 1422

## 2022-07-06 NOTE — ED Notes (Signed)
Pt ambulated from the bed to the door and back with a walker. Pt complained of weakness and dizziness the entire time stating "she felt like she was going to fall and just felt bad." Pt had to stop half way to the door and take a minute before she could continue. Will continue to monitor.

## 2022-07-06 NOTE — ED Triage Notes (Signed)
Patient states that she feels as if she is gonna pass out. Complains of chest soreness and right neck soreness. Patient alert and oriented and reports that this started last night. Patient appears weak

## 2022-07-07 ENCOUNTER — Inpatient Hospital Stay (HOSPITAL_COMMUNITY): Payer: 59

## 2022-07-07 DIAGNOSIS — R0609 Other forms of dyspnea: Secondary | ICD-10-CM | POA: Diagnosis not present

## 2022-07-07 DIAGNOSIS — R55 Syncope and collapse: Secondary | ICD-10-CM | POA: Diagnosis not present

## 2022-07-07 LAB — ECHOCARDIOGRAM COMPLETE
AV Peak grad: 11.8 mmHg
Ao pk vel: 1.72 m/s
Area-P 1/2: 3.02 cm2
Height: 59 in
S' Lateral: 3.6 cm
Single Plane A4C EF: 55.2 %
Weight: 4208.14 oz

## 2022-07-07 LAB — CBC
HCT: 36.1 % (ref 36.0–46.0)
Hemoglobin: 11.8 g/dL — ABNORMAL LOW (ref 12.0–15.0)
MCH: 28.4 pg (ref 26.0–34.0)
MCHC: 32.7 g/dL (ref 30.0–36.0)
MCV: 86.8 fL (ref 80.0–100.0)
Platelets: 204 10*3/uL (ref 150–400)
RBC: 4.16 MIL/uL (ref 3.87–5.11)
RDW: 13 % (ref 11.5–15.5)
WBC: 7 10*3/uL (ref 4.0–10.5)
nRBC: 0 % (ref 0.0–0.2)

## 2022-07-07 LAB — BASIC METABOLIC PANEL
Anion gap: 12 (ref 5–15)
BUN: 9 mg/dL (ref 8–23)
CO2: 24 mmol/L (ref 22–32)
Calcium: 9 mg/dL (ref 8.9–10.3)
Chloride: 100 mmol/L (ref 98–111)
Creatinine, Ser: 1.03 mg/dL — ABNORMAL HIGH (ref 0.44–1.00)
GFR, Estimated: 57 mL/min — ABNORMAL LOW (ref >60)
Glucose, Bld: 81 mg/dL (ref 70–99)
Potassium: 3.4 mmol/L — ABNORMAL LOW (ref 3.5–5.1)
Sodium: 136 mmol/L (ref 135–145)

## 2022-07-07 LAB — MAGNESIUM: Magnesium: 1.7 mg/dL (ref 1.7–2.4)

## 2022-07-07 LAB — PHOSPHORUS: Phosphorus: 3.4 mg/dL (ref 2.5–4.6)

## 2022-07-07 MED ORDER — MAGNESIUM SULFATE 2 GM/50ML IV SOLN
2.0000 g | Freq: Once | INTRAVENOUS | Status: AC
  Administered 2022-07-07: 2 g via INTRAVENOUS
  Filled 2022-07-07: qty 50

## 2022-07-07 MED ORDER — SODIUM CHLORIDE 0.9 % IV SOLN: INTRAVENOUS | Status: DC

## 2022-07-07 MED ORDER — POTASSIUM CHLORIDE CRYS ER 20 MEQ PO TBCR
40.0000 meq | EXTENDED_RELEASE_TABLET | Freq: Once | ORAL | Status: AC
  Administered 2022-07-07: 40 meq via ORAL
  Filled 2022-07-07: qty 2

## 2022-07-07 MED ORDER — PERFLUTREN LIPID MICROSPHERE
1.0000 mL | INTRAVENOUS | Status: AC | PRN
  Administered 2022-07-07: 3 mL via INTRAVENOUS

## 2022-07-07 MED ORDER — AMLODIPINE BESYLATE 5 MG PO TABS
2.5000 mg | ORAL_TABLET | Freq: Every day | ORAL | Status: DC
  Administered 2022-07-07 – 2022-07-11 (×5): 2.5 mg via ORAL
  Filled 2022-07-07 (×4): qty 1
  Filled 2022-07-07: qty 0.5
  Filled 2022-07-07: qty 1

## 2022-07-07 MED ORDER — ESCITALOPRAM OXALATE 20 MG PO TABS
20.0000 mg | ORAL_TABLET | Freq: Every day | ORAL | Status: DC
  Administered 2022-07-08: 20 mg via ORAL
  Filled 2022-07-07: qty 1

## 2022-07-07 NOTE — Plan of Care (Signed)
  Problem: Clinical Measurements: Goal: Ability to maintain clinical measurements within normal limits will improve Outcome: Completed/Met Goal: Will remain free from infection Outcome: Completed/Met Goal: Diagnostic test results will improve Outcome: Completed/Met   

## 2022-07-07 NOTE — Progress Notes (Signed)
PROGRESS NOTE    Sandra Brennan  ZOX:[id number]  DOB: [birth year]  DOA: 07/06/2022 PCP: Anne Ng, NP Outpatient Specialists:   Hospital course:  77 year old female was admitted yesterday for dizziness and ear pain and fullness and perhaps some tinnitus as well.  She was noted to be orthostatic on admission.   Subjective:  Patient states she feels a little bit better today, may be a little bit less dizzy.  Notes she continues to feel weak.  Patient admits to decreased p.o. intake for several months.  Notes that her son died last month and since then she really has not been eating much at all.  Also notes that she has lost her mother, brother, niece and son in the past year.  Patient admits to depressed mood, anhedonia, decreased appetite and general malaise.  Patient denies any suicidal or homicidal ideation.  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  Admits that she has been keeping to herself mostly and has not processed her grief.  Does note that she has close relationships with her pastor whom she can speak with.  Objective: Vitals:   07/06/22 2232 07/07/22 0453 07/07/22 0827 07/07/22 0933  BP:  127/72  119/64  Pulse:  77  74  Resp:    18  Temp:  98.4 F (36.9 C)  98.3 F (36.8 C)  TempSrc:  Oral  Oral  SpO2: 100% 90% 92% 99%  Weight:      Height:        Intake/Output Summary (Last 24 hours) at 07/07/2022 1113 Last data filed at 07/07/2022 0900 Gross per 24 hour  Intake 240 ml  Output --  Net 240 ml   Filed Weights   07/06/22 2205  Weight: 119.3 kg     Exam:  General: Tired appearing female lying in bed with attentive daughter, granddaughter and great granddaughter at bedside Eyes: sclera anicteric, conjuctiva mild injection bilaterally.  Minimal tenderness with pulling of right pinna CVS: S1-S2, regular  Respiratory:  decreased air entry bilaterally secondary to decreased inspiratory effort, rales at bases  GI: NABS,  obese, soft, NT  LE: Warm and well-perfused Neuro: A/O x 3,  grossly nonfocal.  Psych: patient appears tired and lethargic, affect is diminished and flat.  She is intermittently teary during conversation..  Data Reviewed:  Basic Metabolic Panel: Recent Labs  Lab 07/06/22 1425 07/07/22 0144  NA 137 136  K 3.9 3.4*  CL 101 100  CO2 26 24  GLUCOSE 85 81  BUN 10 9  CREATININE 1.07* 1.03*  CALCIUM 9.1 9.0  MG  --  1.7  PHOS  --  3.4    CBC: Recent Labs  Lab 07/06/22 1425 07/07/22 0144  WBC 6.6 7.0  HGB 12.3 11.8*  HCT 40.1 36.1  MCV 92.0 86.8  PLT 220 204     Scheduled Meds:  allopurinol  100 mg Oral Daily   amLODipine  5 mg Oral QHS   aspirin EC  81 mg Oral Daily   atorvastatin  10 mg Oral Daily   cholecalciferol  1,000 Units Oral Daily   enoxaparin (LOVENOX) injection  40 mg Subcutaneous Q24H   escitalopram  10 mg Oral Daily   furosemide  10 mg Oral Daily   hydrALAZINE  10 mg Intravenous Once   lisinopril  40 mg Oral q AM   loratadine  10 mg Oral Daily   mometasone-formoterol  2 puff Inhalation BID   multivitamin  with minerals  1 tablet Oral Daily   pantoprazole  40 mg Oral Daily   pregabalin  150 mg Oral BID   traMADol  50 mg Oral TID   Continuous Infusions:   Assessment & Plan:   Intermittent dizziness/presyncope Weakness Decreased p.o. intake HTN HFpEF I suspect patient's intermittent dizziness is from intravascular volume depletion due to decreased p.o. intake for the past several month, worse for the past month since her son died. Patient states she eats 1 boost and a sandwich a day and that is about it Unsure if she has had any weight loss. Will hydrate patient with NS, I's and O's closely Echocardiogram ordered and pending Will decrease amlodipine to 2.5 mg to normalize blood pressure  Depressed mood/anhedonia vs MDD Decreased p.o. intake Fibromyalgia Patient with death of 5 family members over the past year including son, parent and  sibling Patient admits to feeling depressed and anhedonic. Patient is to talk to her pastor about her grief and loss Discuss whether she is open to [facility] consultation--patient would like to try to speak with pastor first Increase Lexapro to 20 mg p.o. Tentatively, can consider changing to Remeron to stimulate appetite, await to see if patient will agree to [facility] consultation. Continue Lyrica and tramadol for fibromyalgia pain  Ear fullness Will need to look in patient's ear, return with otoscope Minimal tenderness with pulling upward of right pinna.  No tenderness on left.  Hypokalemia Low normal magnesium Will replete and recheck Both can be contributing to weakness  DM 2 Of note, patient is on Ozempic at home which has been held  Blood sugars here are normal and she is not on SSI A1c last month was 5.6     DVT prophylaxis: Lovenox Code Status: Full Family Communication: Patient's daughter, granddaughter and great granddaughter were in the room, patient's son was on the phone    Studies: CT Angio Chest/Abd/Pel for Dissection W and/or Wo Contrast  Result Date: 07/06/2022 CLINICAL DATA:  Acute aortic syndrome (AAS) suspected EXAM: CT ANGIOGRAPHY CHEST, ABDOMEN AND PELVIS TECHNIQUE: Non-contrast CT of the chest was initially obtained. Multidetector CT imaging through the chest, abdomen and pelvis was performed using the standard protocol during bolus administration of intravenous contrast. Multiplanar reconstructed images and MIPs were obtained and reviewed to evaluate the vascular anatomy. RADIATION DOSE REDUCTION: This exam was performed according to the departmental dose-optimization program which includes automated exposure control, adjustment of the mA and/or kV according to patient size and/or use of iterative reconstruction technique. CONTRAST:  OMNIPAQUE IOHEXOL 350 MG/ML SOLN COMPARISON:  01/05/2021 FINDINGS: CTA CHEST FINDINGS Cardiovascular: Heart is normal size. Aorta is  normal caliber. No dissection. No filling defects in the pulmonary arteries to suggest pulmonary emboli. Mediastinum/Nodes: No mediastinal, hilar, or axillary adenopathy. Trachea and esophagus are unremarkable. Thyroid unremarkable. Lungs/Pleura: Lungs are clear. No focal airspace opacities or suspicious nodules. No effusions. Musculoskeletal: Chest wall soft tissues are unremarkable. No acute bony abnormality. Review of the MIP images confirms the above findings. CTA ABDOMEN AND PELVIS FINDINGS VASCULAR Aorta: Normal caliber aorta without aneurysm, dissection, vasculitis or significant stenosis. Infrarenal atherosclerosis and tortuosity. Celiac: Patent without evidence of aneurysm, dissection, vasculitis or significant stenosis. SMA: Patent without evidence of aneurysm, dissection, vasculitis or significant stenosis. Renals: Both renal arteries are patent without evidence of aneurysm, dissection, vasculitis, fibromuscular dysplasia or significant stenosis. IMA: Patent without evidence of aneurysm, dissection, vasculitis or significant stenosis. Inflow: Patent without evidence of aneurysm, dissection, vasculitis or significant stenosis. Veins: No  obvious venous abnormality within the limitations of this arterial phase study. Review of the MIP images confirms the above findings. NON-VASCULAR Hepatobiliary: Small layering gallstones within the gallbladder. No focal hepatic abnormality. Pancreas: No focal abnormality or ductal dilatation. Spleen: No focal abnormality.  Normal size. Adrenals/Urinary Tract: No suspicious renal or adrenal mass. No stones or hydronephrosis. Urinary bladder decompressed and obscured by beam hardening artifact from the left hip replacement. Stomach/Bowel: Left colonic diverticulosis. No active diverticulitis. Stomach and small bowel decompressed. No bowel obstruction. Lymphatic: No adenopathy Reproductive: Lower pelvic structures obscured by beam hardening artifact from left hip replacement.  Other: No free fluid or free air. Musculoskeletal: No acute bony abnormality. Left hip replacement. Degenerative changes in the lumbar spine. Review of the MIP images confirms the above findings. IMPRESSION: No evidence of aortic aneurysm or dissection. Atherosclerotic calcifications in the infrarenal aorta. No evidence of pulmonary embolus. No acute findings in the chest, abdomen or pelvis. Cholelithiasis. Left colonic diverticulosis. Electronically Signed   By: Charlett Nose M.D.   On: 07/06/2022 19:10   CT HEAD WO CONTRAST ( )  Result Date: 07/06/2022 CLINICAL DATA:  Headache. EXAM: CT HEAD WITHOUT CONTRAST TECHNIQUE: Contiguous axial images were obtained from the base of the skull through the vertex without intravenous contrast. RADIATION DOSE REDUCTION: This exam was performed according to the departmental dose-optimization program which includes automated exposure control, adjustment of the mA and/or kV according to patient size and/or use of iterative reconstruction technique. COMPARISON:  Head CT dated 08/25/2021. FINDINGS: Brain: The ventricles and sulci are appropriate size for the patient's age. The gray-white matter discrimination is preserved. There is no acute intracranial hemorrhage. No mass effect or midline shift. No extra-axial fluid collection. Vascular: No hyperdense vessel or unexpected calcification. Skull: Normal. Negative for fracture or focal lesion. Sinuses/Orbits: No acute finding. Other: None IMPRESSION: No acute intracranial pathology. Electronically Signed   By: Elgie Collard M.D.   On: 07/06/2022 19:06   DG Chest Port 1 View  Result Date: 07/06/2022 CLINICAL DATA:  Chest pain, short of breath EXAM: PORTABLE CHEST 1 VIEW COMPARISON:  08/25/2021 FINDINGS: Single frontal view of the chest demonstrates stable enlargement of the cardiac silhouette. No airspace disease, effusion, or pneumothorax. Severe degenerative changes of the bilateral shoulders again noted. No acute fracture.  IMPRESSION: 1. Stable enlarged cardiac silhouette. 2. No acute airspace disease. Electronically Signed   By: Sharlet Salina M.D.   On: 07/06/2022 15:02    Principal Problem:   Near syncope     Pieter Partridge, [facility]  If 7PM-7AM, please contact night-coverage [email or web address]   LOS: 1 day

## 2022-07-08 ENCOUNTER — Encounter: Payer: Medicaid Other | Admitting: Registered Nurse

## 2022-07-08 DIAGNOSIS — F329 Major depressive disorder, single episode, unspecified: Secondary | ICD-10-CM

## 2022-07-08 DIAGNOSIS — R55 Syncope and collapse: Secondary | ICD-10-CM | POA: Diagnosis not present

## 2022-07-08 MED ORDER — ESCITALOPRAM OXALATE 10 MG PO TABS
10.0000 mg | ORAL_TABLET | Freq: Every day | ORAL | Status: DC
  Administered 2022-07-09 – 2022-07-11 (×3): 10 mg via ORAL
  Filled 2022-07-08 (×3): qty 1

## 2022-07-08 NOTE — Progress Notes (Signed)
Mobility Specialist Progress Note   07/08/22 1218  Mobility  Level of Assistance Minimal assist, patient does 75% or more  Assistive Device Front wheel walker  Distance Ambulated (ft) 2 ft  Activity Response Tolerated well  Mobility Referral Yes  $Mobility charge 1 Mobility  Mobility Specialist Start Time (ACUTE ONLY) 1201  Mobility Specialist Stop Time (ACUTE ONLY) 1216  Mobility Specialist Time Calculation (min) (ACUTE ONLY) 15 min   Received pt in bed c/o Bilat foot pain but agreeable to mobility. MinA to EOB and stand but pt grossly deconditioned and expressing fatigue immediately. Pt trying to prematurely sit in chair and requiring cues on foot and hand placement. Pt left in chair w/ call bell in reach and chair alarm on. RN notified about pain.   Frederico Hamman Mobility Specialist Please contact via SecureChat or  Rehab office at [phone]

## 2022-07-08 NOTE — Progress Notes (Signed)
  Transition of Care [facility]) Screening Note   Patient Details  Name: Sandra Brennan Date of Birth: 1945/10/30   Transition of Care [facility]) CM/SW Contact:    Tom-Johnson, Hershal Coria, RN Phone Number: 07/08/2022, 2:10 PM  Patient is admitted for near Syncope, Dizziness and Ear pain. Patient noted to be orthostatic on admission.   Transition of Care Department [facility]) has reviewed patient and no TOC needs or recommendations have been identified at this time. TOC will continue to monitor patient advancement through interdisciplinary progression rounds. If new patient transition needs arise, please place a TOC consult.

## 2022-07-08 NOTE — Progress Notes (Signed)
PROGRESS NOTE    Sandra Brennan  ZOX:[id number]  DOB: 02-Feb-1946  DOA: 07/06/2022 PCP: Sandra Ng, NP Outpatient Specialists:   Hospital course:  77 year old female was admitted yesterday for dizziness and ear pain and fullness and perhaps some tinnitus as well.  She was noted to be orthostatic on admission.   Subjective:  Patient states she would be agreeable to talk to a psychiatrist.  She states she does feel a little bit better after speaking with me yesterday.  Is trying to eat more.  Notes she could not walk when she tried to get out of bed today due to weakness in her body and legs.   Objective: Vitals:   07/08/22 0850 07/08/22 0948 07/08/22 0950 07/08/22 0953  BP: (!) 124/50 (!) 118/48 (!) 163/85 117/70  Pulse: 64 67 83   Resp: 18 18    Temp:      TempSrc:      SpO2: 96% 97% 93%   Weight:      Height:        Intake/Output Summary (Last 24 hours) at 07/08/2022 1129 Last data filed at 07/08/2022 0940 Gross per 24 hour  Intake 2222.51 ml  Output 0 ml  Net 2222.51 ml    Filed Weights   07/06/22 2205 07/08/22 0814  Weight: 119.3 kg 123.7 kg     Exam:  General: Patient lying flat in bed in an ARD, she had just been given a bath. Eyes: sclera anicteric, conjuctiva mild injection bilaterally.  Minimal tenderness with pulling of right pinna CVS: S1-S2, regular  Respiratory:  decreased air entry bilaterally secondary to decreased inspiratory effort, rales at bases  GI: NABS, obese, soft, NT  LE: Warm and well-perfused Neuro: A/O x 3,  grossly nonfocal.  Psych: patient making more eye contact, affect is less flat.  Data Reviewed:  Basic Metabolic Panel: Recent Labs  Lab 07/06/22 1425 07/07/22 0144  NA 137 136  K 3.9 3.4*  CL 101 100  CO2 26 24  GLUCOSE 85 81  BUN 10 9  CREATININE 1.07* 1.03*  CALCIUM 9.1 9.0  MG  --  1.7  PHOS  --  3.4     CBC: Recent Labs  Lab 07/06/22 1425 07/07/22 0144  WBC 6.6 7.0  HGB 12.3 11.8*  HCT 40.1  36.1  MCV 92.0 86.8  PLT 220 204      Scheduled Meds:  allopurinol  100 mg Oral Daily   amLODipine  2.5 mg Oral QHS   aspirin EC  81 mg Oral Daily   atorvastatin  10 mg Oral Daily   cholecalciferol  1,000 Units Oral Daily   enoxaparin (LOVENOX) injection  40 mg Subcutaneous Q24H   escitalopram  20 mg Oral Daily   furosemide  10 mg Oral Daily   hydrALAZINE  10 mg Intravenous Once   lisinopril  40 mg Oral q AM   loratadine  10 mg Oral Daily   mometasone-formoterol  2 puff Inhalation BID   multivitamin with minerals  1 tablet Oral Daily   pantoprazole  40 mg Oral Daily   pregabalin  150 mg Oral BID   traMADol  50 mg Oral TID   Continuous Infusions:  sodium chloride 100 mL/hr at 07/08/22 8119     Assessment & Plan:   Intermittent dizziness/presyncope Weakness Decreased p.o. intake HTN HFpEF I suspect patient's intermittent dizziness is from intravascular volume depletion due to decreased p.o. intake for the past several month, worse for the past  month since her son died. Patient states she is feeling like she has more energy today, feels less dizzy, however notes that she was unable to stand up due to weakness in her legs and body. Will decrease NS to 75 cc an hour as patient states she is trying to eat more Echocardiogram done yesterday shows normal EF, no wall motion abnormalities and grade 1 diastolic dysfunction. I decreased her amlodipine to 2.5 mg yesterday and BP is higher which may or may not be contributing to her sense of increased energy--if BP remains higher than 140/88, would put her back on 5 mg of amlodipine especially given diastolic dysfunction seen on echocardiogram.  Depressed mood/anhedonia vs MDD Decreased p.o. intake Fibromyalgia Patient with death of 5 family members over the past year including son, parent and sibling Patient admits to feeling depressed and anhedonic. Patient is agreeable to see a psychiatrist today. I have increased her Lexapro to  20 mg yesterday, up from 10 mg that she had been on. She may or may not benefit from changing to Remeron to improve appetite per psych recommendations Continue Lyrica and tramadol for fibromyalgia pain  Ear fullness Will need to look in patient's ear, return with otoscope Minimal tenderness with pulling upward of right pinna.  No tenderness on left.  Hypokalemia Low normal magnesium Have been repleted, recheck levels tomorrow  DM 2 Of note, patient is on Ozempic at home which has been held  Blood sugars here are normal and she is not on SSI A1c last month was 5.6     DVT prophylaxis: Lovenox Code Status: Full Family Communication: Patient's daughter, granddaughter and great granddaughter were in the room, patient's son was on the phone    Studies: ECHOCARDIOGRAM COMPLETE  Result Date: 07/07/2022    ECHOCARDIOGRAM REPORT   Patient Name:   Sandra Brennan Date of Exam: 07/07/2022 Medical Rec #:  [id number]       Height:       59.0 in Accession #:    [id number]      Weight:       263.0 lb Date of Birth:  15-May-1945        BSA:          2.072 m Patient Age:    77 years        BP:           119/64 mmHg Patient Gender: F               HR:           71 bpm. Exam Location:  Inpatient Procedure: 2D Echo, Cardiac Doppler, Color Doppler and Intracardiac            Opacification Agent Indications:    Dyspnea  History:        Patient has prior history of Echocardiogram examinations, most                 recent 03/31/2016. Previous Myocardial Infarction, COPD,                 Signs/Symptoms:Dyspnea; Risk Factors:Hypertension and Diabetes.  Sonographer:    Sandra Brennan Sonographer#2:  Sandra Brennan Referring Phys: Sandra Brennan  Sonographer Comments: Image acquisition challenging due to patient body habitus. IMPRESSIONS  1. Left ventricular ejection fraction, by estimation, is 55 to 60%. The left ventricle has normal function. The left ventricle has no regional wall motion abnormalities. Left  ventricular diastolic parameters are consistent with Grade I diastolic dysfunction (  impaired relaxation).  2. Right ventricular systolic function is normal. The right ventricular size is normal.  3. Left atrial size was mildly dilated.  4. Right atrial size was mildly dilated.  5. On the parasternal long axis images, there is a mobile density on the anterior leaflet of the mitral valve, this may be mobile calcium but if clinical suspicion exists for endocarditis would recommend TEE to further evalaute. The mitral valve is normal in structure. No evidence of mitral valve regurgitation. No evidence of mitral stenosis.  6. The aortic valve is tricuspid. There is mild calcification of the aortic valve. Aortic valve regurgitation is not visualized. Aortic valve sclerosis/calcification is present, without any evidence of aortic stenosis.  7. The inferior vena cava is normal in size with greater than 50% respiratory variability, suggesting right atrial pressure of 3 mmHg. Conclusion(s)/Recommendation(s): On the parasternal long axis images, there is a mobile density on the anterior leaflet of the mitral valve, this may be mobile calcium but if clinical suspicion exists for endocarditis would recommend TEE to further evalaute. FINDINGS  Left Ventricle: Left ventricular ejection fraction, by estimation, is 55 to 60%. The left ventricle has normal function. The left ventricle has no regional wall motion abnormalities. Definity contrast agent was given IV to delineate the left ventricular  endocardial borders. The left ventricular internal cavity size was normal in size. There is no left ventricular hypertrophy. Left ventricular diastolic parameters are consistent with Grade I diastolic dysfunction (impaired relaxation). Right Ventricle: The right ventricular size is normal. No increase in right ventricular wall thickness. Right ventricular systolic function is normal. Left Atrium: Left atrial size was mildly dilated. Right  Atrium: Right atrial size was mildly dilated. Pericardium: There is no evidence of pericardial effusion. Mitral Valve: On the parasternal long axis images, there is a mobile density on the anterior leaflet of the mitral valve, this may be mobile calcium but if clinical suspicion exists for endocarditis would recommend TEE to further evalaute. The mitral valve is normal in structure. There is mild calcification of the mitral valve leaflet(s). No evidence of mitral valve regurgitation. No evidence of mitral valve stenosis. Tricuspid Valve: The tricuspid valve is normal in structure. Tricuspid valve regurgitation is trivial. No evidence of tricuspid stenosis. Aortic Valve: The aortic valve is tricuspid. There is mild calcification of the aortic valve. Aortic valve regurgitation is not visualized. Aortic valve sclerosis/calcification is present, without any evidence of aortic stenosis. Aortic valve peak gradient measures 11.8 mmHg. Pulmonic Valve: The pulmonic valve was normal in structure. Pulmonic valve regurgitation is not visualized. No evidence of pulmonic stenosis. Aorta: The aortic root is normal in size and structure. Venous: The inferior vena cava is normal in size with greater than 50% respiratory variability, suggesting right atrial pressure of 3 mmHg. IAS/Shunts: No atrial level shunt detected by color flow Doppler.  LEFT VENTRICLE PLAX 2D LVIDd:         5.10 cm      Diastology LVIDs:         3.60 cm      LV e' medial:    6.85 cm/s LV PW:         1.00 cm      LV E/e' medial:  12.8 LV IVS:        1.00 cm      LV e' lateral:   12.00 cm/s  LV E/e' lateral: 7.3  LV Volumes (MOD) LV vol d, MOD A4C: 125.0 ml LV vol s, MOD A4C: 56.0 ml LV SV MOD A4C:     125.0 ml RIGHT VENTRICLE RV Basal diam:  3.50 cm RV S prime:     14.10 cm/s TAPSE (M-mode): 2.0 cm LEFT ATRIUM             Index        RIGHT ATRIUM           Index LA diam:        3.60 cm 1.74 cm/m   RA Area:     16.40 cm LA Vol (A2C):    54.7 ml 26.40 ml/m  RA Volume:   48.40 ml  23.36 ml/m LA Vol (A4C):   40.8 ml 19.69 ml/m LA Biplane Vol: 49.8 ml 24.03 ml/m  AORTIC VALVE AV Vmax:      172.00 cm/s AV Peak Grad: 11.8 mmHg LVOT Vmax:    126.00 cm/s LVOT Vmean:   78.000 cm/s LVOT VTI:     0.235 m  AORTA Ao Root diam: 2.70 cm Ao Asc diam:  2.90 cm MITRAL VALVE MV Area (PHT): 3.02 cm     SHUNTS MV Decel Time: 251 msec     Systemic VTI: 0.24 m MV E velocity: 87.90 cm/s MV A velocity: 113.00 cm/s MV E/A ratio:  0.78 Arvilla Meres MD Electronically signed by Arvilla Meres MD Signature Date/Time: 07/07/2022/3:01:40 PM    Final    CT Angio Chest/Abd/Pel for Dissection W and/or Wo Contrast  Result Date: 07/06/2022 CLINICAL DATA:  Acute aortic syndrome (AAS) suspected EXAM: CT ANGIOGRAPHY CHEST, ABDOMEN AND PELVIS TECHNIQUE: Non-contrast CT of the chest was initially obtained. Multidetector CT imaging through the chest, abdomen and pelvis was performed using the standard protocol during bolus administration of intravenous contrast. Multiplanar reconstructed images and MIPs were obtained and reviewed to evaluate the vascular anatomy. RADIATION DOSE REDUCTION: This exam was performed according to the departmental dose-optimization program which includes automated exposure control, adjustment of the mA and/or kV according to patient size and/or use of iterative reconstruction technique. CONTRAST:  OMNIPAQUE IOHEXOL 350 MG/ML SOLN COMPARISON:  01/05/2021 FINDINGS: CTA CHEST FINDINGS Cardiovascular: Heart is normal size. Aorta is normal caliber. No dissection. No filling defects in the pulmonary arteries to suggest pulmonary emboli. Mediastinum/Nodes: No mediastinal, hilar, or axillary adenopathy. Trachea and esophagus are unremarkable. Thyroid unremarkable. Lungs/Pleura: Lungs are clear. No focal airspace opacities or suspicious nodules. No effusions. Musculoskeletal: Chest wall soft tissues are unremarkable. No acute bony abnormality. Review  of the MIP images confirms the above findings. CTA ABDOMEN AND PELVIS FINDINGS VASCULAR Aorta: Normal caliber aorta without aneurysm, dissection, vasculitis or significant stenosis. Infrarenal atherosclerosis and tortuosity. Celiac: Patent without evidence of aneurysm, dissection, vasculitis or significant stenosis. SMA: Patent without evidence of aneurysm, dissection, vasculitis or significant stenosis. Renals: Both renal arteries are patent without evidence of aneurysm, dissection, vasculitis, fibromuscular dysplasia or significant stenosis. IMA: Patent without evidence of aneurysm, dissection, vasculitis or significant stenosis. Inflow: Patent without evidence of aneurysm, dissection, vasculitis or significant stenosis. Veins: No obvious venous abnormality within the limitations of this arterial phase study. Review of the MIP images confirms the above findings. NON-VASCULAR Hepatobiliary: Small layering gallstones within the gallbladder. No focal hepatic abnormality. Pancreas: No focal abnormality or ductal dilatation. Spleen: No focal abnormality.  Normal size. Adrenals/Urinary Tract: No suspicious renal or adrenal mass. No stones or hydronephrosis. Urinary bladder decompressed and obscured by beam hardening artifact  from the left hip replacement. Stomach/Bowel: Left colonic diverticulosis. No active diverticulitis. Stomach and small bowel decompressed. No bowel obstruction. Lymphatic: No adenopathy Reproductive: Lower pelvic structures obscured by beam hardening artifact from left hip replacement. Other: No free fluid or free air. Musculoskeletal: No acute bony abnormality. Left hip replacement. Degenerative changes in the lumbar spine. Review of the MIP images confirms the above findings. IMPRESSION: No evidence of aortic aneurysm or dissection. Atherosclerotic calcifications in the infrarenal aorta. No evidence of pulmonary embolus. No acute findings in the chest, abdomen or pelvis. Cholelithiasis. Left  colonic diverticulosis. Electronically Signed   By: Charlett Nose M.D.   On: 07/06/2022 19:10   CT HEAD WO CONTRAST ( )  Result Date: 07/06/2022 CLINICAL DATA:  Headache. EXAM: CT HEAD WITHOUT CONTRAST TECHNIQUE: Contiguous axial images were obtained from the base of the skull through the vertex without intravenous contrast. RADIATION DOSE REDUCTION: This exam was performed according to the departmental dose-optimization program which includes automated exposure control, adjustment of the mA and/or kV according to patient size and/or use of iterative reconstruction technique. COMPARISON:  Head CT dated 08/25/2021. FINDINGS: Brain: The ventricles and sulci are appropriate size for the patient's age. The gray-white matter discrimination is preserved. There is no acute intracranial hemorrhage. No mass effect or midline shift. No extra-axial fluid collection. Vascular: No hyperdense vessel or unexpected calcification. Skull: Normal. Negative for fracture or focal lesion. Sinuses/Orbits: No acute finding. Other: None IMPRESSION: No acute intracranial pathology. Electronically Signed   By: Elgie Collard M.D.   On: 07/06/2022 19:06   DG Chest Port 1 View  Result Date: 07/06/2022 CLINICAL DATA:  Chest pain, short of breath EXAM: PORTABLE CHEST 1 VIEW COMPARISON:  08/25/2021 FINDINGS: Single frontal view of the chest demonstrates stable enlargement of the cardiac silhouette. No airspace disease, effusion, or pneumothorax. Severe degenerative changes of the bilateral shoulders again noted. No acute fracture. IMPRESSION: 1. Stable enlarged cardiac silhouette. 2. No acute airspace disease. Electronically Signed   By: Sharlet Salina M.D.   On: 07/06/2022 15:02    Principal Problem:   Near syncope     Pieter Partridge, [facility]  If 7PM-7AM, please contact night-coverage [email or web address]   LOS: 2 days

## 2022-07-08 NOTE — Consult Note (Cosign Needed Addendum)
[facility] Face-to-Face Psychiatry Consult   Reason for Consult: MDD, admitted for dec po intake, has depressed mood, has lost 5 family members including mother and son last year.   Referring Physician:   Patient Identification: Sandra Brennan MRN:  [id number] Principal Diagnosis: Near syncope Diagnosis:  Principal Problem:   Near syncope   Total Time spent with patient: 1 hour  Subjective:   Sandra Brennan is a 77 y.o. female patient admitted with dizziness.  Psychiatry consult requested by Dr. Luberta Robertson for suspected major depression decrease or intent depressed mood and bereavement.  Patient is seen and assessed very welcoming to participate in psychiatric evaluation.  Patient does admit to depressive symptoms that includes decreased interest in doing things, sadness, decreased appetite.  She reports being self aware of the symptoms, and has sought out help in her primary care office.  She reports being started on antidepressant several days ago however was admitted to the hospital several days after starting new prescription.  She reports starting an antidepressant last year, however discontinued after symptoms increased.  Chart review confirms this medication was fluoxetine.  Patient denies any physical or worsening psychiatric symptoms since starting escitalopram.  We did review course of action/expectation in regards to most antidepressants.  Patient is able to verbalize understanding, and agrees to continue escitalopram to manage depressive symptoms.  She states" I will do whatever it takes to get me faster so I can get out of here."  Patient does adamantly deny and refute all thoughts, ideas or intentions of suicide.  She further denies any homicidal ideations and no acute psychotic symptoms.  She denies any acute symptoms of mania at this time to include impulsivity, grandiosity, mood lability, hypersexuality.  She does not present with any of the above symptoms on this evaluation.  She denies  any acute psychosis, paranoia.  She does not appear to be displaying any or responding to internal stimuli, external stimuli, or exhibiting delusional thought disorder.  Patient denies any access to weapons, denies any alcohol and or substance abuse.  She reports moderate sleep and fair appetite.  She also is receiving services through primary care, and her family is available to assist with medication management in which she reports compliance with most of her appointments (chart verified).  Patient denies any auditory and/or visual hallucinations, does not appear to be responding to internal or external stimuli.  There is no evidence of delusional thought content and patient appears to answer all questions appropriately.  At this time patient appears to be psychiatrically stable to discharge home, with support system services in place. While she does have depressive symptoms, she is currently receiving outpatient treatment and it is felt she can be best managed in an outpatient setting.  Will continue Lexapro that was started on Jul 03, 2022.  Patient can see primary care for dose adjustment and follow-up.  Her current presentation of strong reactions to stress that involve negative thoughts, changes in behavior, difficulty sleeping, and decreased appetite are consistent with adjustment disorder.  Additionally while adjustment disorder is likely contributing to symptoms of depression, it is felt that patient also meets criteria for comorbid major depressive disorder given the severity and persistence of symptoms.  She expresses strong desire for grief counseling and shows insight into the importance of seeking therapeutic help.  Although patient has depressive symptoms, she currently denies suicidality and it is not felt that she meets criteria for involuntary commitment or inpatient psychiatric hospitalization.   HPI:  77 year old female  was admitted yesterday for dizziness and ear pain and fullness and  perhaps some tinnitus as well. She was noted to be orthostatic on admission.   Past Psychiatric History: Adjustment disorder, bereavement.  Currently taking Lexapro recently started on Jul 03, 2022 by primary care provider.  Reports remote history of fluoxetine, however discontinued due to worsening in depressive symptoms in September 2023.  Outside of this patient denies any previous psychiatric history to include inpatient hospitalization, suicide attempt, substance abuse, legal charges.  Risk to Self:  Denies Risk to Others:  Denies Prior Inpatient Therapy:  Denies Prior Outpatient Therapy:  Currently being managed by outpatient primary care provider  Past Medical History:  Past Medical History:  Diagnosis Date   Acute nontraumatic kidney injury (HCC) 05/05/2013   Acute posthemorrhagic anemia 09/06/2012   Anemia    Arthritis    "plenty" (2012/08/31)   Asthma    Chest pain 03/30/2016   Chronic lower back pain    "they say I need a whole new spinal column" (08-31-12)   COPD (chronic obstructive pulmonary disease) (HCC)    Degenerative arthritis    "all over" (August 31, 2012)   Fx ankle    GERD (gastroesophageal reflux disease)    Gout 05/05/2013   Hypertension    Migraines    "used to; totally stopped when I quit drinking 30 yr ago" (08-31-2012)   Myalgia and myositis    Myocardial infarction [facility]) 1992   Aug 31, 2012 "mild MI when son died "   Obesity    Osteoporosis    "all over" (08/31/2012)   Shortness of breath    when stressed.   Sleep apnea    "never did fix my machine; haven't used one for 5 years; I've lost 116# since then; no problems now" (2012/08/31)   Syncope 03/30/2016   Type II diabetes mellitus (HCC)    "borderline; don't test; take Metformin" (2012/08/31)    Past Surgical History:  Procedure Laterality Date   ABDOMINAL HYSTERECTOMY  1990's   GANGLION CYST EXCISION Right 1980's   "wrist" (08-31-2012)   JOINT REPLACEMENT     KNEE LIGAMENT RECONSTRUCTION Right 1980's    TONSILLECTOMY  ~ 1954   TOTAL HIP ARTHROPLASTY Left 08/31/2012   TOTAL HIP ARTHROPLASTY Left 08-31-2012   Procedure: TOTAL HIP ARTHROPLASTY;  Surgeon: [name], MD;  Location: [facility] OR;  Service: Orthopedics;  Laterality: Left;  Left Total Hip Arthroplasty   TOTAL HIP ARTHROPLASTY Left 08/28/2012   Procedure: Irrigation and Debridement hip ;  Surgeon: Kathryne Hitch, MD;  Location: [facility] OR;  Service: Orthopedics;  Laterality: Left;   TOTAL KNEE ARTHROPLASTY Left 2001   TOTAL KNEE ARTHROPLASTY Right 2004   Family History:  Family History  Problem Relation Age of Onset   Arthritis Mother    Arthritis Father    Colon cancer Father    Diabetes Sister    Arthritis Sister    Diabetes Maternal Grandmother    Arthritis Maternal Grandmother    Diabetes Maternal Grandfather    Arthritis Maternal Grandfather    Migraines Neg Hx    Sleep apnea Neg Hx    Family Psychiatric  History: Noncontributory Social History: In regards to social history patient is a widow, Engineer, maintenance (IT), has a large support system to include multiple children, grandchildren, and great grandchildren.  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 Social History   Substance and Sexual Activity  Alcohol Use No   Alcohol/week: 0.0 standard drinks of alcohol   Comment: 08/03/2012 "used to be a beeralcoholic"; stopped ~ 30 yr ago"     Social History   Substance and Sexual Activity  Drug Use No    Social History   Socioeconomic History   Marital status: Widowed    Spouse name: Not on file   Number of children: 5   Years of education: PhD   Highest education level: Not on file  Occupational History   Occupation: Retired  Tobacco Use   Smoking status: Former    Packs/day: 0.12    Years: 2.00    Additional pack years: 0.00    Total pack years: 0.24    Types: Cigarettes   Smokeless tobacco: Never    Tobacco comments:    08/03/2012 "quit 30 years ago"  Vaping Use   Vaping Use: Never used  Substance and Sexual Activity   Alcohol use: No    Alcohol/week: 0.0 standard drinks of alcohol    Comment: 08/03/2012 "used to be a beeralcoholic"; stopped ~ 30 yr ago"   Drug use: No   Sexual activity: Never  Other Topics Concern   Not on file  Social History Narrative   Lives at home with her mother and caregiver.- mother past away 01/2021 and now lives alone.   Occasional use of caffeine.   Right-handed.   Social Determinants of Health   Financial Resource Strain: Low Risk  (11/26/2021)   Overall Financial Resource Strain (CARDIA)    Difficulty of Paying Living Expenses: Not hard at all  Food Insecurity: No Food Insecurity (07/06/2022)   Hunger Vital Sign    Worried About Running Out of Food in the Last Year: Never true    Ran Out of Food in the Last Year: Never true  Transportation Needs: No Transportation Needs (07/06/2022)   PRAPARE - Administrator, Civil Service (Medical): No    Lack of Transportation (Non-Medical): No  Physical Activity: Insufficiently Active (11/26/2021)   Exercise Vital Sign    Days of Exercise per Week: 3 days    Minutes of Exercise per Session: 30 min  Stress: No Stress Concern Present (11/26/2021)   [employer] of Occupational Health - Occupational Stress Questionnaire    Feeling of Stress : Not at all  Social Connections: Moderately Isolated (11/26/2021)   Social Connection and Isolation Panel [NHANES]    Frequency of Communication with Friends and Family: More than three times a week    Frequency of Social Gatherings with Friends and Family: More than three times a week    Attends Religious Services: More than 4 times per year    Active Member of [employer] or Organizations: No    Attends Banker Meetings: Never    Marital Status: Widowed   Additional Social History:    Allergies:   Allergies  Allergen Reactions   Cymbalta  [Duloxetine Hcl] Anaphylaxis   Pineapple Shortness Of Breath   Sulfa Antibiotics Hives, Shortness Of Breath and Itching   Influenza Vaccines Hives and Itching   Other Swelling and Other (See Comments)    Pork barbeque- Made the face swell   Penicillins Hives   Theophyllines Hives    Labs:  Results for orders placed or performed during the hospital encounter of 07/06/22 (from the past 48 hour(s))  Troponin I (High Sensitivity)     Status: None   Collection Time: 07/06/22  6:05 PM  Result Value Ref Range  Troponin I (High Sensitivity) 6 <18 ng/L    Comment: (NOTE) Elevated high sensitivity troponin I (hsTnI) values and significant  changes across serial measurements may suggest ACS but many other  chronic and acute conditions are known to elevate hsTnI results.  Refer to the "Links" section for chest pain algorithms and additional  guidance. Performed at [facility], [address], [place], [place] [address]   CBC     Status: Abnormal   Collection Time: 07/07/22  1:44 AM  Result Value Ref Range   WBC 7.0 4.0 - 10.5 K/uL   RBC 4.16 3.87 - 5.11 MIL/uL   Hemoglobin 11.8 (L) 12.0 - 15.0 g/dL   HCT 91.4 78.2 - 95.6 %   MCV 86.8 80.0 - 100.0 fL   MCH 28.4 26.0 - 34.0 pg   MCHC 32.7 30.0 - 36.0 g/dL   RDW 21.3 08.6 - 57.8 %   Platelets 204 150 - 400 K/uL   nRBC 0.0 0.0 - 0.2 %    Comment: Performed at [facility], [address], [place], [place] [address]  Basic metabolic panel     Status: Abnormal   Collection Time: 07/07/22  1:44 AM  Result Value Ref Range   Sodium 136 135 - 145 mmol/L   Potassium 3.4 (L) 3.5 - 5.1 mmol/L   Chloride 100 98 - 111 mmol/L   CO2 24 22 - 32 mmol/L   Glucose, Bld 81 70 - 99 mg/dL    Comment: Glucose reference range applies only to samples taken after fasting for at least 8 hours.   BUN 9 8 - 23 mg/dL   Creatinine, Ser 9.52 (H) 0.44 - 1.00 mg/dL   Calcium 9.0 8.9 - 84.1 mg/dL   GFR, Estimated 57 (L) >60 mL/min    Comment:  (NOTE) Calculated using the CKD-EPI Creatinine Equation (2021)    Anion gap 12 5 - 15    Comment: Performed at [facility], [address] [place], [place] [address]  Magnesium     Status: None   Collection Time: 07/07/22  1:44 AM  Result Value Ref Range   Magnesium 1.7 1.7 - 2.4 mg/dL    Comment: Performed at [facility], [address] [place], [place] [address]  Phosphorus     Status: None   Collection Time: 07/07/22  1:44 AM  Result Value Ref Range   Phosphorus 3.4 2.5 - 4.6 mg/dL    Comment: Performed at [facility], [address] [place], [place] [address]    Current Facility-Administered Medications  Medication Dose Route Frequency Provider Last Rate Last Admin   0.9 %  sodium chloride infusion   Intravenous Continuous Pieter Partridge, MD 100 mL/hr at 07/08/22 0937 New Bag at 07/08/22 0937   acetaminophen (TYLENOL) tablet 650 mg  650 mg Oral Q6H PRN Darlin Drop, DO   650 mg at 07/08/22 1232   albuterol (PROVENTIL) (2.5 MG/3ML) 0.083% nebulizer solution 2.5 mg  2.5 mg Nebulization Q6H PRN Dow Adolph N, DO       allopurinol (ZYLOPRIM) tablet 100 mg  100 mg Oral Daily Dow Adolph N, DO   100 mg at 07/08/22 4034   amLODipine (NORVASC) tablet 2.5 mg  2.5 mg Oral QHS Leandro Reasoner Tublu, MD   2.5 mg at 07/07/22 2130   aspirin EC tablet 81 mg  81 mg Oral Daily Darlin Drop, DO   81 mg at 07/08/22 [id number]  atorvastatin (LIPITOR) tablet 10 mg  10 mg Oral Daily Dow Adolph N, DO   10 mg at 07/08/22 1610   cholecalciferol (VITAMIN D3) 25 MCG (1000 UNIT) tablet 1,000 Units  1,000 Units Oral Daily Dow Adolph N, DO   1,000 Units at 07/08/22 9604   enoxaparin (LOVENOX) injection 40 mg  40 mg Subcutaneous Q24H Dow Adolph N, DO   40 mg at 07/08/22 5409   escitalopram (LEXAPRO) tablet 20 mg  20 mg Oral Daily Leandro Reasoner Tublu, MD   20 mg at 07/08/22 8119   furosemide (LASIX) tablet 10 mg  10 mg Oral Daily Darlin Drop, DO   10 mg at  07/08/22 1478   hydrALAZINE (APRESOLINE) injection 10 mg  10 mg Intravenous Once Charlynne Pander, MD       lisinopril (ZESTRIL) tablet 40 mg  40 mg Oral q AM Dow Adolph N, DO   40 mg at 07/08/22 2956   loratadine (CLARITIN) tablet 10 mg  10 mg Oral Daily Darlin Drop, DO   10 mg at 07/08/22 2130   melatonin tablet 5 mg  5 mg Oral QHS PRN Dow Adolph N, DO   5 mg at 07/07/22 2132   mometasone-formoterol (DULERA) 200-5 MCG/ACT inhaler 2 puff  2 puff Inhalation BID Dow Adolph N, DO   2 puff at 07/08/22 0736   multivitamin with minerals tablet 1 tablet  1 tablet Oral Daily Dow Adolph N, DO   1 tablet at 07/08/22 0940   naphazoline-pheniramine (NAPHCON-A) 0.025-0.3 % ophthalmic solution 1 drop  1 drop Both Eyes TID PRN Dow Adolph N, DO       pantoprazole (PROTONIX) EC tablet 40 mg  40 mg Oral Daily Dow Adolph N, DO   40 mg at 07/08/22 8657   polyethylene glycol (MIRALAX / GLYCOLAX) packet 17 g  17 g Oral Daily PRN Dow Adolph N, DO       pregabalin (LYRICA) capsule 150 mg  150 mg Oral BID Dow Adolph N, DO   150 mg at 07/08/22 8469   prochlorperazine (COMPAZINE) injection 5 mg  5 mg Intravenous Q6H PRN Dow Adolph N, DO       SUMAtriptan (IMITREX) tablet 25 mg  25 mg Oral Q2H PRN Dow Adolph N, DO       traMADol (ULTRAM) tablet 50 mg  50 mg Oral TID Dow Adolph N, DO   50 mg at 07/08/22 1523    Musculoskeletal: Strength & Muscle Tone: decreased Gait & Station: normal Patient leans: N/A  Psychiatric Specialty Exam:  Presentation  General Appearance:  Appropriate for Environment; Casual  Eye Contact: Fair  Speech: Clear and Coherent; Normal Rate  Speech Volume: Decreased  Handedness: Right   Mood and Affect  Mood: Depressed  Affect: Congruent; Appropriate   Thought Process  Thought Processes: Coherent; Goal Directed  Descriptions of Associations:Intact  Orientation:Full (Time, Place and Person)  Thought Content:Logical  History of  Schizophrenia/Schizoaffective disorder:No data recorded Duration of Psychotic Symptoms:No data recorded Hallucinations:Hallucinations: None  Ideas of Reference:None  Suicidal Thoughts:Suicidal Thoughts: No  Homicidal Thoughts:Homicidal Thoughts: No   Sensorium  Memory: Immediate Good; Recent Good; Remote Good  Judgment: Fair  Insight: Good   Executive Functions  Concentration: Fair  Attention Span: Good  Recall: Good  Fund of Knowledge: Good  Language: Good   Psychomotor Activity  Psychomotor Activity: Psychomotor Activity: Normal   Assets  Assets: Communication Skills; Desire for Improvement; Financial Resources/Insurance; Housing; Intimacy; Leisure Time; Resilience; Social Support  Sleep  Sleep: Sleep: Good Number of Hours of Sleep: 7   Physical Exam: Physical Exam Vitals and nursing note reviewed.  Constitutional:      Appearance: Normal appearance. She is normal weight.  Neurological:     General: No focal deficit present.     Mental Status: She is alert and oriented to person, place, and time. Mental status is at baseline.  Psychiatric:        Attention and Perception: Attention and perception normal.        Mood and Affect: Affect normal. Mood is depressed.        Speech: Speech normal.        Behavior: Behavior normal. Behavior is cooperative.        Thought Content: Thought content normal.        Cognition and Memory: Cognition and memory normal.        Judgment: Judgment normal.    Review of Systems  Psychiatric/Behavioral:  Positive for depression. Negative for hallucinations, memory loss, substance abuse and suicidal ideas. The patient is not nervous/anxious and does not have insomnia.   All other systems reviewed and are negative.  Blood pressure 117/60, pulse 64, temperature 98.4 F (36.9 C), resp. rate 18, height 4\' 11"  (1.499 m), weight 123.7 kg, SpO2 94 %. Body mass index is 55.08 kg/m.  Treatment Plan Summary: Plan  Will increase lexapro 10mg .   -Consider TOC referral for grief counseling and bereavement services. -Consider adult daycare for social interaction with peers her age.  Disposition: No evidence of imminent risk to self or others at present.   Patient does not meet criteria for psychiatric inpatient admission. Supportive therapy provided about ongoing stressors. Discussed crisis plan, support from social network, calling 911, coming to the Emergency Department, and calling Suicide Hotline.  Maryagnes Amos, FNP 07/08/2022 4:39 PM

## 2022-07-09 DIAGNOSIS — E876 Hypokalemia: Secondary | ICD-10-CM | POA: Diagnosis not present

## 2022-07-09 DIAGNOSIS — Z6841 Body Mass Index (BMI) 40.0 and over, adult: Secondary | ICD-10-CM

## 2022-07-09 DIAGNOSIS — R55 Syncope and collapse: Secondary | ICD-10-CM | POA: Diagnosis not present

## 2022-07-09 DIAGNOSIS — I10 Essential (primary) hypertension: Secondary | ICD-10-CM

## 2022-07-09 LAB — BASIC METABOLIC PANEL
Anion gap: 7 (ref 5–15)
BUN: 20 mg/dL (ref 8–23)
CO2: 24 mmol/L (ref 22–32)
Calcium: 8 mg/dL — ABNORMAL LOW (ref 8.9–10.3)
Chloride: 106 mmol/L (ref 98–111)
Creatinine, Ser: 1.28 mg/dL — ABNORMAL HIGH (ref 0.44–1.00)
GFR, Estimated: 44 mL/min — ABNORMAL LOW (ref >60)
Glucose, Bld: 94 mg/dL (ref 70–99)
Potassium: 4.3 mmol/L (ref 3.5–5.1)
Sodium: 137 mmol/L (ref 135–145)

## 2022-07-09 LAB — MAGNESIUM: Magnesium: 2.1 mg/dL (ref 1.7–2.4)

## 2022-07-09 NOTE — Evaluation (Signed)
Occupational Therapy Evaluation Patient Details Name: Sandra Brennan MRN: [id number] DOB: 1945/05/16 Today's Date: 07/09/2022   History of Present Illness Pt is a 77 y/o F admitted on 07/06/22 after presenting with c/o dizziness & ear pain & fullness. Pt noted to be orthostatic on admission. Pt is being treated for intermittent dizziness/presyncope, weakness, & decreased p.o. intake. PMH: acute nontraumatic kidney injury, anemia, arthritis, asthma, chest pain, chronic LBP, COPD, ankle fx, gout, HTN, migraines, obesity, sleep apnea, syncope, DM2   Clinical Impression   PTA, pt lived alone in a handicapped accessible apartment, volunteers, and keeps her 25 y.o. grandson. Upon eval, pt presents with BUE and BLE tremor intermittently and more prominent during initiation of transfers. Pt presenting with decreased activity tolerance, requiring frequent rest breaks, generalized weakness, and poor balance. Pt requiring up to mod A for LB ADL and min A for transfers. Due to tremor and decr strength/balance, pt is at an increased fall risk. Will follow acutely, and Patient will benefit from continued inpatient follow up therapy, <3 hours/day.      Recommendations for follow up therapy are one component of a multi-disciplinary discharge planning process, led by the attending physician.  Recommendations may be updated based on patient status, additional functional criteria and insurance authorization.   Assistance Recommended at Discharge Intermittent Supervision/Assistance  Patient can return home with the following A little help with walking and/or transfers;A little help with bathing/dressing/bathroom;A lot of help with bathing/dressing/bathroom;Assistance with cooking/housework;Help with stairs or ramp for entrance;Assist for transportation    Functional Status Assessment  Patient has had a recent decline in their functional status and demonstrates the ability to make significant improvements in function in a  reasonable and predictable amount of time.  Equipment Recommendations  Other (comment) (defer)    Recommendations for Other Services       Precautions / Restrictions Precautions Precautions: Fall Restrictions Weight Bearing Restrictions: No      Mobility Bed Mobility Overal bed mobility: Needs Assistance Bed Mobility: Sit to Supine       Sit to supine: Mod assist   General bed mobility comments: to bring BLE into bed    Transfers Overall transfer level: Needs assistance Equipment used: Rolling walker (2 wheels) Transfers: Sit to/from Stand Sit to Stand: Min assist           General transfer comment: extra time to power up and transition hands to RW. Min A for rise and stedy      Balance Overall balance assessment: Needs assistance Sitting-balance support: Feet supported, Bilateral upper extremity supported Sitting balance-Leahy Scale: Fair     Standing balance support: Bilateral upper extremity supported, During functional activity, Reliant on assistive device for balance Standing balance-Leahy Scale: Poor                             ADL either performed or assessed with clinical judgement   ADL Overall ADL's : Needs assistance/impaired Eating/Feeding: Modified independent;Bed level   Grooming: Set up;Sitting   Upper Body Bathing: Set up;Sitting   Lower Body Bathing: Moderate assistance;Sit to/from stand   Upper Body Dressing : Set up;Sitting   Lower Body Dressing: Moderate assistance;Sit to/from stand   Toilet Transfer: Minimal assistance;Ambulation;Rolling walker (2 wheels);BSC/3in1 Toilet Transfer Details (indicate cue type and reason): AMBULATORY INTO RESTROOM Toileting- Clothing Manipulation and Hygiene: Set up;Sitting/lateral lean       Functional mobility during ADLs: Min guard;Minimal assistance;Rolling walker (2 wheels) General ADL Comments:  BUE and LE tremor with transfers     Vision Baseline Vision/History: 1 Wears  glasses Ability to See in Adequate Light: 0 Adequate Patient Visual Report: No change from baseline Vision Assessment?: No apparent visual deficits     Perception     Praxis      Pertinent Vitals/Pain Pain Assessment Pain Assessment: Faces Faces Pain Scale: Hurts a little bit Pain Location: BLE to touch Pain Descriptors / Indicators: Grimacing, Guarding, Discomfort Pain Intervention(s): Limited activity within patient's tolerance, Monitored during session     Hand Dominance Right   Extremity/Trunk Assessment Upper Extremity Assessment Upper Extremity Assessment: Generalized weakness   Lower Extremity Assessment Lower Extremity Assessment: Defer to PT evaluation       Communication Communication Communication: No difficulties   Cognition Arousal/Alertness: Awake/alert Behavior During Therapy: WFL for tasks assessed/performed Overall Cognitive Status: Within Functional Limits for tasks assessed                                       General Comments       Exercises     Shoulder Instructions      Home Living Family/patient expects to be discharged to:: Private residence Living Arrangements: Alone Available Help at Discharge: Family;Available PRN/intermittently Type of Home: Apartment ("handicap accessible apartment") Home Access:  (level entry at back, 1 small step at front door)     Home Layout: One level               Home Equipment: Agricultural consultant (2 wheels);Cane - single point;Rollator (4 wheels)          Prior Functioning/Environment Prior Level of Function : Independent/Modified Independent             Mobility Comments: Pt reports she's ambulatory within her home without AD but "should" use a walker, denies falls in the past 6 months. 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 Sleeps in recliner.  Pt notes she has never drove.          OT  Problem List: Decreased strength;Decreased activity tolerance;Impaired balance (sitting and/or standing);Decreased knowledge of use of DME or AE      OT Treatment/Interventions: Self-care/ADL training;Therapeutic exercise;DME and/or AE instruction;Balance training;Patient/family education;Therapeutic activities    OT Goals(Current goals can be found in the care plan section) Acute Rehab OT Goals Patient Stated Goal: no more tremor OT Goal Formulation: With patient Time For Goal Achievement: 07/23/22 Potential to Achieve Goals: Good  OT Frequency: Min 2X/week    Co-evaluation              AM-PAC OT "6 Clicks" Daily Activity     Outcome Measure Help from another person eating meals?: None Help from another person taking care of personal grooming?: A Little Help from another person toileting, which includes using toliet, bedpan, or urinal?: A Little Help from another person bathing (including washing, rinsing, drying)?: A Lot Help from another person to put on and taking off regular upper body clothing?: A Little Help from another person to put on and taking off regular lower body clothing?: A Lot 6 Click Score: 17   End of Session Equipment Utilized During Treatment: Gait belt;Rolling walker (2 wheels) Nurse Communication: Mobility status  Activity Tolerance: Patient tolerated treatment well Patient left: in bed;with call bell/phone within reach  OT Visit Diagnosis: Unsteadiness  on feet (R26.81);Muscle weakness (generalized) (M62.81)                Time: 6295-2841 OT Time Calculation (min): 35 min Charges:  OT General Charges $OT Visit: 1 Visit OT Evaluation $OT Eval Low Complexity: 1 Low OT Treatments $Self Care/Home Management : 8-22 mins  Tyler Deis, OTR/L [facility] Acute Rehabilitation Office: [phone]   Myrla Halsted 07/09/2022, 5:40 PM

## 2022-07-09 NOTE — Progress Notes (Signed)
PROGRESS NOTE    Sandra Brennan  WUJ:[id number] DOB: 1945-03-12 DOA: 07/06/2022 PCP: Anne Ng, NP   Brief Narrative: Sandra Brennan is a 77 y.o. female with a history of morbid obesity, fibromyalgia, anxiety, depression, migraines, OSA on CPAP, diabetes mellitus type 2, hypertension, hyperlipidemia.  Patient presents secondary to recurrent lightheadedness with concern for presyncopal episode.  Workup negative. Patient managed with IV fluids.  PT/OT consulted with recommendation for SNF on discharge.   Assessment and Plan:  Presyncope Unclear etiology. Presumed possibly secondary to hypovolemia. Orthostatic vitals appear to have been negative. Transthoracic Echocardiogram significant for normal EF with no aortic valve stenosis/regurgitation noted. -PT/OT evaluation -Continue telemetry  Lightheadedness Presumed secondary to hypovolemia. CT head without acute process noted. Orthostatic vitals negative.  Primary hypertension Normotensive. -Continue lisinopril  HFpEF Noted. Patient is not fluid overloaded at this time. Patient stared on IV fluids for concern for hypovolemia.   Elevated creatinine Baseline creatinine of about 1. Creatinine of 1.07 on admission with trend upward to 1.28 today. Unsure if related to IV fluids vs lisinopril use while possibly dehydrated. -BMP in AM  Depressed mood In setting of multiple family deaths over the past year. Psychiatry was consulted this admission and recommended grief counseling, adult daycare for social interaction with peers and to continue Lexapro 10 mg daily.  Hypokalemia Mild. Potassium of 3.4. Resolved with potassium supplementation.  Diabetes mellitus type 2 Well controlled. Recent hemoglobin A1C of 5.6%. patient is managed on semaglutide as an outpatient, which was held on admission. -Carb modified diet  Mitral valve mobile density Noted on Transthoracic Echocardiogram. Patient without clinical concern for  endocarditis.   Morbid obesity Estimated body mass index is 55.08 kg/m as calculated from the following:   Height as of this encounter: 4\' 11"  (1.499 m).   Weight as of this encounter: 123.7 kg.   DVT prophylaxis: Lovenox Code Status:   Code Status: Full Code Family Communication: None at bedside Disposition Plan: Discharge pending PT/OT evaluation/recommendations and stable renal function   Consultants:  Psychiatry  Procedures:  None  Antimicrobials: None    Subjective: Patient reports continuing to have lightheadedness with getting up. She reports no dizziness/vertigo symptoms. Ambulation is also impaired by bilateral leg pain, which she has had for at least one year.  Objective: BP 130/72 (BP Location: Right Arm)   Pulse 78   Temp 97.6 F (36.4 C) (Oral)   Resp 16   Ht 4\' 11"  (1.499 m)   Wt 123.7 kg   SpO2 100%   BMI 55.08 kg/m   Examination:  General exam: Appears calm and comfortable Respiratory system: Clear to auscultation. Respiratory effort normal. Cardiovascular system: S1 & S2 heard, RRR. No murmurs, rubs, gallops or clicks. Gastrointestinal system: Abdomen is nondistended, soft and nontender. No organomegaly or masses felt. Normal bowel sounds heard. Central nervous system: Alert and oriented. No focal neurological deficits. Musculoskeletal: No isolated calf tenderness but patient has bilateral LE tenderness to light touch from feet to thighs Skin: No cyanosis. No rashes. Psychiatry: Judgement and insight appear normal. Mood & affect appropriate.    Data Reviewed: I have personally reviewed following labs and imaging studies  CBC Lab Results  Component Value Date   WBC 7.0 07/07/2022   RBC 4.16 07/07/2022   HGB 11.8 (L) 07/07/2022   HCT 36.1 07/07/2022   MCV 86.8 07/07/2022   MCH 28.4 07/07/2022   PLT 204 07/07/2022   MCHC 32.7 07/07/2022   RDW 13.0 07/07/2022  LYMPHSABS 2.4 08/25/2021   MONOABS 0.4 08/25/2021   EOSABS 0.1 08/25/2021    BASOSABS 0.0 08/25/2021     Last metabolic panel Lab Results  Component Value Date   NA 137 07/09/2022   K 4.3 07/09/2022   CL 106 07/09/2022   CO2 24 07/09/2022   BUN 20 07/09/2022   CREATININE 1.28 (H) 07/09/2022   GLUCOSE 94 07/09/2022   GFRNONAA 44 (L) 07/09/2022   GFRAA 46 (L) 03/07/2019   CALCIUM 8.0 (L) 07/09/2022   PHOS 3.4 07/07/2022   PROT 7.3 07/06/2022   ALBUMIN 3.5 07/06/2022   LABGLOB 2.6 07/14/2014   AGRATIO 1.7 07/14/2014   BILITOT 0.5 07/06/2022   ALKPHOS 92 07/06/2022   AST 17 07/06/2022   ALT 12 07/06/2022   ANIONGAP 7 07/09/2022    GFR: Estimated Creatinine Clearance: 45.2 mL/min (A) (by C-G formula based on SCr of 1.28 mg/dL (H)).  No results found for this or any previous visit (from the past 240 hour(s)).    Radiology Studies: ECHOCARDIOGRAM COMPLETE  Result Date: 07/07/2022    ECHOCARDIOGRAM REPORT   Patient Name:   Sandra Brennan Date of Exam: 07/07/2022 Medical Rec #:  [id number]       Height:       59.0 in Accession #:    [id number]      Weight:       263.0 lb Date of Birth:  [birth year]        BSA:          2.072 m Patient Age:    77 years        BP:           119/64 mmHg Patient Gender: F               HR:           71 bpm. Exam Location:  Inpatient Procedure: 2D Echo, Cardiac Doppler, Color Doppler and Intracardiac            Opacification Agent Indications:    Dyspnea  History:        Patient has prior history of Echocardiogram examinations, most                 recent 03/31/2016. Previous Myocardial Infarction, COPD,                 Signs/Symptoms:Dyspnea; Risk Factors:Hypertension and Diabetes.  Sonographer:    Raeford Razor Sonographer#2:  Milbert Coulter Referring Phys: Raynaldo Opitz CHATTERJEE  Sonographer Comments: Image acquisition challenging due to patient body habitus. IMPRESSIONS  1. Left ventricular ejection fraction, by estimation, is 55 to 60%. The left ventricle has normal function. The left ventricle has no regional wall motion abnormalities.  Left ventricular diastolic parameters are consistent with Grade I diastolic dysfunction (impaired relaxation).  2. Right ventricular systolic function is normal. The right ventricular size is normal.  3. Left atrial size was mildly dilated.  4. Right atrial size was mildly dilated.  5. On the parasternal long axis images, there is a mobile density on the anterior leaflet of the mitral valve, this may be mobile calcium but if clinical suspicion exists for endocarditis would recommend TEE to further evalaute. The mitral valve is normal in structure. No evidence of mitral valve regurgitation. No evidence of mitral stenosis.  6. The aortic valve is tricuspid. There is mild calcification of the aortic valve. Aortic valve regurgitation is not visualized. Aortic valve sclerosis/calcification is present, without any evidence of aortic stenosis.  7. The inferior vena cava is normal in size with greater than 50% respiratory variability, suggesting right atrial pressure of 3 mmHg. Conclusion(s)/Recommendation(s): On the parasternal long axis images, there is a mobile density on the anterior leaflet of the mitral valve, this may be mobile calcium but if clinical suspicion exists for endocarditis would recommend TEE to further evalaute. FINDINGS  Left Ventricle: Left ventricular ejection fraction, by estimation, is 55 to 60%. The left ventricle has normal function. The left ventricle has no regional wall motion abnormalities. Definity contrast agent was given IV to delineate the left ventricular  endocardial borders. The left ventricular internal cavity size was normal in size. There is no left ventricular hypertrophy. Left ventricular diastolic parameters are consistent with Grade I diastolic dysfunction (impaired relaxation). Right Ventricle: The right ventricular size is normal. No increase in right ventricular wall thickness. Right ventricular systolic function is normal. Left Atrium: Left atrial size was mildly dilated.  Right Atrium: Right atrial size was mildly dilated. Pericardium: There is no evidence of pericardial effusion. Mitral Valve: On the parasternal long axis images, there is a mobile density on the anterior leaflet of the mitral valve, this may be mobile calcium but if clinical suspicion exists for endocarditis would recommend TEE to further evalaute. The mitral valve is normal in structure. There is mild calcification of the mitral valve leaflet(s). No evidence of mitral valve regurgitation. No evidence of mitral valve stenosis. Tricuspid Valve: The tricuspid valve is normal in structure. Tricuspid valve regurgitation is trivial. No evidence of tricuspid stenosis. Aortic Valve: The aortic valve is tricuspid. There is mild calcification of the aortic valve. Aortic valve regurgitation is not visualized. Aortic valve sclerosis/calcification is present, without any evidence of aortic stenosis. Aortic valve peak gradient measures 11.8 mmHg. Pulmonic Valve: The pulmonic valve was normal in structure. Pulmonic valve regurgitation is not visualized. No evidence of pulmonic stenosis. Aorta: The aortic root is normal in size and structure. Venous: The inferior vena cava is normal in size with greater than 50% respiratory variability, suggesting right atrial pressure of 3 mmHg. IAS/Shunts: No atrial level shunt detected by color flow Doppler.  LEFT VENTRICLE PLAX 2D LVIDd:         5.10 cm      Diastology LVIDs:         3.60 cm      LV e' medial:    6.85 cm/s LV PW:         1.00 cm      LV E/e' medial:  12.8 LV IVS:        1.00 cm      LV e' lateral:   12.00 cm/s                             LV E/e' lateral: 7.3  LV Volumes (MOD) LV vol d, MOD A4C: 125.0 ml LV vol s, MOD A4C: 56.0 ml LV SV MOD A4C:     125.0 ml RIGHT VENTRICLE RV Basal diam:  3.50 cm RV S prime:     14.10 cm/s TAPSE (M-mode): 2.0 cm LEFT ATRIUM             Index        RIGHT ATRIUM           Index LA diam:        3.60 cm 1.74 cm/m   RA Area:     16.40 cm LA Vol  (A2C):  54.7 ml 26.40 ml/m  RA Volume:   48.40 ml  23.36 ml/m LA Vol (A4C):   40.8 ml 19.69 ml/m LA Biplane Vol: 49.8 ml 24.03 ml/m  AORTIC VALVE AV Vmax:      172.00 cm/s AV Peak Grad: 11.8 mmHg LVOT Vmax:    126.00 cm/s LVOT Vmean:   78.000 cm/s LVOT VTI:     0.235 m  AORTA Ao Root diam: 2.70 cm Ao Asc diam:  2.90 cm MITRAL VALVE MV Area (PHT): 3.02 cm     SHUNTS MV Decel Time: 251 msec     Systemic VTI: 0.24 m MV E velocity: 87.90 cm/s MV A velocity: 113.00 cm/s MV E/A ratio:  0.78 Arvilla Meres MD Electronically signed by Arvilla Meres MD Signature Date/Time: 07/07/2022/3:01:40 PM    Final       LOS: 3 days    Jacquelin Hawking, MD [facility] 07/09/2022, 10:27 AM   If 7PM-7AM, please contact night-coverage [email or web address]

## 2022-07-09 NOTE — Evaluation (Signed)
Physical Therapy Evaluation Patient Details Name: Sandra Brennan MRN: [id number] DOB: [birth year] Today's Date: 07/09/2022  History of Present Illness  Pt is a 77 y/o F admitted on 07/06/22 after presenting with c/o dizziness & ear pain & fullness. Pt noted to be orthostatic on admission. Pt is being treated for intermittent dizziness/presyncope, weakness, & decreased p.o. intake. PMH: acute nontraumatic kidney injury, anemia, arthritis, asthma, chest pain, chronic LBP, COPD, ankle fx, gout, HTN, migraines, obesity, sleep apnea, syncope, DM2  Clinical Impression  Pt seen for PT evaluation with pt agreeable. Pt reports prior to admission she was living alone in an apartment, ambulating without AD, watching her 37 y/o grandson throughout the day, & volunteering. On this date, pt is able to transfer STS from recliner with min assist but with tremulous movements in BLE during attempt & continued once standing. Pt tolerates standing <1 minute before returning to sit 2/2 fatigue & appearing to feel unwell, BP in RUE 140/72 mmHg MAP 91.  Pt notes this trembling/shaking is new & recently started. Once pt feeling better in sitting pt performed BLE strengthening exercises with cuing for technique. Pt noted she was pretty fatigued after minimal activity during PT session. At this time, pt is unsafe to d/c home alone & would benefit from ongoing PT services to address strength, balance, & endurance training to increase independence in bed mobility, transfers, & gait.   Recommendations for follow up therapy are one component of a multi-disciplinary discharge planning process, led by the attending physician.  Recommendations may be updated based on patient status, additional functional criteria and insurance authorization.  Follow Up Recommendations Can patient physically be transported by private vehicle: No     Assistance Recommended at Discharge Frequent or constant Supervision/Assistance  Patient can return home  with the following  A lot of help with walking and/or transfers;A lot of help with bathing/dressing/bathroom;Assistance with cooking/housework;Direct supervision/assist for financial management;Assist for transportation;Help with stairs or ramp for entrance    Equipment Recommendations None recommended by PT (TBD in next venue)  Recommendations for Other Services       Functional Status Assessment Patient has had a recent decline in their functional status and demonstrates the ability to make significant improvements in function in a reasonable and predictable amount of time.     Precautions / Restrictions Precautions Precautions: Fall Restrictions Weight Bearing Restrictions: No      Mobility  Bed Mobility               General bed mobility comments: not tested, pt received & left sitting in recliner    Transfers Overall transfer level: Needs assistance Equipment used: Rolling walker (2 wheels) Transfers: Sit to/from Stand Sit to Stand: Min assist           General transfer comment: extra time to power up to standing & transition hands from recliner armrests to RW    Ambulation/Gait                  Stairs            Wheelchair Mobility    Modified [place] (Stroke Patients Only)       Balance Overall balance assessment: Needs assistance Sitting-balance support: Feet supported, Bilateral upper extremity supported Sitting balance-Leahy Scale: Fair     Standing balance support: Bilateral upper extremity supported, During functional activity, Reliant on assistive device for balance Standing balance-Leahy Scale: Poor  Pertinent Vitals/Pain Pain Assessment Pain Assessment: Faces Faces Pain Scale: Hurts a little bit Pain Location: BLE to touch Pain Descriptors / Indicators: Grimacing, Guarding, Discomfort Pain Intervention(s): Repositioned    Home Living Family/patient expects to be discharged  to:: Private residence Living Arrangements: Alone Available Help at Discharge: Family;Available PRN/intermittently Type of Home: Apartment ("handicap accessible apartment") Home Access:  (level entry at back, 1 small step at front door)       Home Layout: One level Home Equipment: Agricultural consultant (2 wheels);Cane - single point;Rollator (4 wheels)      Prior Function Prior Level of Function : Independent/Modified Independent             Mobility Comments: Pt reports she's ambulatory within her home without AD but "should" use a walker, denies falls in the past 6 months. 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 Sleeps in recliner.  Pt notes she has never drove.       Hand Dominance        Extremity/Trunk Assessment   Upper Extremity Assessment Upper Extremity Assessment: Generalized weakness    Lower Extremity Assessment Lower Extremity Assessment: Generalized weakness       Communication   Communication: No difficulties  Cognition Arousal/Alertness: Awake/alert Behavior During Therapy: WFL for tasks assessed/performed Overall Cognitive Status: Within Functional Limits for tasks assessed                                          General Comments      Exercises General Exercises - Lower Extremity Long Arc Quad: AROM, Strengthening, Both, 10 reps, Seated Hip ABduction/ADduction: AROM, Seated, Strengthening, Both, 10 reps (hip adduction pillow squeezes) Hip Flexion/Marching: AROM, Seated, Strengthening, Both, 10 reps   Assessment/Plan    PT Assessment Patient needs continued PT services  PT Problem List Decreased strength;Decreased range of motion;Decreased activity tolerance;Decreased balance;Decreased mobility;Decreased safety awareness;Decreased knowledge of use of DME       PT Treatment Interventions DME instruction;Therapeutic exercise;Gait training;Balance  training;Neuromuscular re-education;Functional mobility training;Therapeutic activities;Patient/family education;Modalities    PT Goals (Current goals can be found in the Care Plan section)  Acute Rehab PT Goals Patient Stated Goal: get better PT Goal Formulation: With patient Time For Goal Achievement: 07/23/22 Potential to Achieve Goals: Good    Frequency Min 3X/week     Co-evaluation               AM-PAC PT "6 Clicks" Mobility  Outcome Measure Help needed turning from your back to your side while in a flat bed without using bedrails?: A Little Help needed moving from lying on your back to sitting on the side of a flat bed without using bedrails?: A Lot Help needed moving to and from a bed to a chair (including a wheelchair)?: A Little Help needed standing up from a chair using your arms (e.g., wheelchair or bedside chair)?: A Little Help needed to walk in hospital room?: A Lot Help needed climbing 3-5 steps with a railing? : Total 6 Click Score: 14    End of Session   Activity Tolerance: Patient limited by fatigue Patient left: in chair;with chair alarm set;with call bell/phone within reach   PT Visit Diagnosis: Muscle weakness (generalized) (M62.81);Difficulty in walking, not elsewhere classified (R26.2);Other abnormalities of gait and mobility (R26.89);Unsteadiness on feet (R26.81)  Time: 1341-1401 PT Time Calculation (min) (ACUTE ONLY): 20 min   Charges:   PT Evaluation $PT Eval Low Complexity: 1 Low          Aleda Grana, PT, DPT 07/09/22, 3:03 PM   Sandi Mariscal 07/09/2022, 3:01 PM

## 2022-07-09 NOTE — Progress Notes (Signed)
Mobility Specialist Progress Note   07/09/22 1131  Orthostatic Lying   BP- Lying 122/58  Pulse- Lying 56  Orthostatic Sitting  BP- Sitting 147/77  Pulse- Sitting 77  Orthostatic Standing at 0 minutes  BP- Standing at 0 minutes 167/88  Pulse- Standing at 0 minutes 84  Orthostatic Standing at 3 minutes  BP- Standing at 3 minutes 142/61  Pulse- Standing at 3 minutes 61  Mobility  Activity Transferred from bed to chair  Level of Assistance Minimal assist, patient does 75% or more  Assistive Device Front wheel walker  Distance Ambulated (ft) 4 ft  Activity Response Tolerated well  Mobility Referral Yes  $Mobility charge 1 Mobility  Mobility Specialist Start Time (ACUTE ONLY) 1106  Mobility Specialist Stop Time (ACUTE ONLY) 1130  Mobility Specialist Time Calculation (min) (ACUTE ONLY) 24 min   Received in bed c/o BLE pain but agreeable. Able to get to EOB w/ minG and stand w/ minA from an elevated surface. Pt requiring cues for foot clearance when stepping + cues on hand placement when sitting. Pt having decreased activity tolerance and requesting to sit after ~39mins of standing. Left in chair w/ call bell in reach, chair alarm on and needs met.  Frederico Hamman Mobility Specialist Please contact via SecureChat or  Rehab office at [phone]

## 2022-07-09 NOTE — Plan of Care (Signed)
Patient AOX4, delayed responses baseline.  VSS throughout shift.  All meds given on time as ordered.  Pt c/o headache relieved by PRN sumatriptan.  Diminished lungs, IS encouraged.  Purewick in place. POC maintained, will continue to monitor.  Problem: Education: Goal: Knowledge of General Education information will improve Description: Including pain rating scale, medication(s)/side effects and non-pharmacologic comfort measures Outcome: Progressing   Problem: Health Behavior/Discharge Planning: Goal: Ability to manage health-related needs will improve Outcome: Progressing   Problem: Clinical Measurements: Goal: Respiratory complications will improve Outcome: Progressing Goal: Cardiovascular complication will be avoided Outcome: Progressing   Problem: Activity: Goal: Risk for activity intolerance will decrease Outcome: Progressing   Problem: Nutrition: Goal: Adequate nutrition will be maintained Outcome: Progressing   Problem: Coping: Goal: Level of anxiety will decrease Outcome: Progressing   Problem: Elimination: Goal: Will not experience complications related to bowel motility Outcome: Progressing Goal: Will not experience complications related to urinary retention Outcome: Progressing   Problem: Pain Managment: Goal: General experience of comfort will improve Outcome: Progressing   Problem: Safety: Goal: Ability to remain free from injury will improve Outcome: Progressing   Problem: Skin Integrity: Goal: Risk for impaired skin integrity will decrease Outcome: Progressing

## 2022-07-09 NOTE — Care Management Important Message (Signed)
Important Message  Patient Details  Name: Sandra Brennan MRN: [id number] Date of Birth: 1946/01/17   Medicare Important Message Given:  Yes     Dorena Bodo 07/09/2022, 2:39 PM

## 2022-07-09 NOTE — Hospital Course (Addendum)
Sandra Brennan is a 77 y.o. female with a history of morbid obesity, fibromyalgia, anxiety, depression, migraines, OSA on CPAP, diabetes mellitus type 2, hypertension, hyperlipidemia.  Patient presents secondary to recurrent lightheadedness with concern for presyncopal episode.  Workup negative. Patient managed with IV fluids.  PT/OT consulted with recommendation for SNF on discharge.

## 2022-07-09 NOTE — Care Management Important Message (Signed)
Important Message  Patient Details  Name: Sandra Brennan MRN: [id number] Date of Birth: 1945-11-26   Medicare Important Message Given:  Yes     Dorena Bodo 07/09/2022, 2:38 PM

## 2022-07-10 DIAGNOSIS — R55 Syncope and collapse: Secondary | ICD-10-CM | POA: Diagnosis not present

## 2022-07-10 DIAGNOSIS — E876 Hypokalemia: Secondary | ICD-10-CM | POA: Diagnosis not present

## 2022-07-10 DIAGNOSIS — I10 Essential (primary) hypertension: Secondary | ICD-10-CM | POA: Diagnosis not present

## 2022-07-10 LAB — BASIC METABOLIC PANEL
Anion gap: 6 (ref 5–15)
BUN: 14 mg/dL (ref 8–23)
CO2: 24 mmol/L (ref 22–32)
Calcium: 8.2 mg/dL — ABNORMAL LOW (ref 8.9–10.3)
Chloride: 107 mmol/L (ref 98–111)
Creatinine, Ser: 1.15 mg/dL — ABNORMAL HIGH (ref 0.44–1.00)
GFR, Estimated: 50 mL/min — ABNORMAL LOW (ref >60)
Glucose, Bld: 91 mg/dL (ref 70–99)
Potassium: 4.1 mmol/L (ref 3.5–5.1)
Sodium: 137 mmol/L (ref 135–145)

## 2022-07-10 MED ORDER — ORAL CARE MOUTH RINSE: 15.0000 mL | OROMUCOSAL | Status: DC | PRN

## 2022-07-10 NOTE — [place] FL2 (Signed)
[place] MEDICAID FL2 LEVEL OF CARE FORM     IDENTIFICATION  Patient Name: Sandra Brennan Birthdate: 12-12-1945 Sex: female Admission Date (Current Location): 07/06/2022  [place] and [place] Number:  Haynes Bast [id number] R Facility and Address:  [facility], [address], [place], [place] [address]      Provider Number: [id number]  Attending Physician Name and Address:  Narda Bonds, MD  Relative Name and Phone Number:  Harvel Ricks (Daughter) [phone]    Current Level of Care: Hospital Recommended Level of Care: Skilled Nursing Facility Prior Approval Number:    Date Approved/Denied:   PASRR Number: [id number]  Discharge Plan: SNF    Current Diagnoses: Patient Active Problem List   Diagnosis Date Noted   Near syncope 07/06/2022   Chronic constipation 06/04/2022   Bilateral leg edema 01/01/2022   Chronic pain syndrome 05/31/2021   Lumbar radiculitis 04/18/2021   CKD (chronic kidney disease) stage 3, GFR 30-59 ml/min (HCC) 08/20/2020   Knee pain 08/20/2020   Intertrigo 03/27/2020   Normocytic anemia 08/23/2019   Polyethylene liner wear following total hip arthroplasty requiring isolated polyethylene liner exchange (HCC) 07/12/2019   Depression, recurrent (HCC) 07/12/2018   Migraine without aura and without status migrainosus, not intractable 09/10/2017   Post-traumatic osteoarthritis of right ankle 06/09/2016   Spondylosis without myelopathy or radiculopathy, lumbar region 06/09/2016   Cervical spinal stenosis 06/09/2016   Spinal stenosis, lumbar region, without neurogenic claudication 06/09/2016   Chronic gouty arthritis 06/04/2016   Vitamin D deficiency 06/02/2016   Hyperlipidemia 06/02/2016   Closed right ankle fracture 05/01/2016   Colon polyps 05/01/2016   Fibromyalgia syndrome 05/01/2016   Pharyngeal dysphagia 06/21/2015   Morbid obesity (HCC) 05/06/2013   Osteoarthritis of left hip 08/05/2012   Type 2 diabetes mellitus  with diabetic polyneuropathy, without long-term current use of insulin (HCC) 08/05/2012   Hypertension 08/05/2012   Asthma, chronic 08/05/2012   OSA (obstructive sleep apnea) 08/05/2012    Orientation RESPIRATION BLADDER Height & Weight     Time, Situation, Place, Self  Normal Incontinent Weight: 274 lb 4 oz (124.4 kg) Height:  4\' 11"  (149.9 cm)  BEHAVIORAL SYMPTOMS/MOOD NEUROLOGICAL BOWEL NUTRITION STATUS      Continent Diet (see d/c summary)  AMBULATORY STATUS COMMUNICATION OF NEEDS Skin   Extensive Assist Verbally Normal                       Personal Care Assistance Level of Assistance  Bathing, Feeding, Dressing Bathing Assistance: Limited assistance Feeding assistance: Independent Dressing Assistance: Limited assistance     Functional Limitations Info  Sight, Hearing, Speech Sight Info: Impaired (eyeglasses) Hearing Info: Adequate Speech Info: Adequate    SPECIAL CARE FACTORS FREQUENCY  OT (By licensed OT), PT (By licensed PT)     PT Frequency: 5x/ week OT Frequency: 5x/ week            Contractures Contractures Info: Not present    Additional Factors Info  Code Status, Allergies, Psychotropic Code Status Info: FULL Allergies Info: Cymbalta (Duloxetine Hcl)  Pineapple  Sulfa Antibiotics  Influenza Vaccines  Other  Penicillins  Theophyllines Psychotropic Info: escitalopram (LEXAPRO) tablet 10 mg         Current Medications (07/10/2022):  This is the current hospital active medication list Current Facility-Administered Medications  Medication Dose Route Frequency Provider Last Rate Last Admin   0.9 %  sodium chloride infusion   Intravenous Continuous Pieter Partridge, MD 100 mL/hr at 07/10/22  0255 New Bag at 07/10/22 0255   acetaminophen (TYLENOL) tablet 650 mg  650 mg Oral Q6H PRN Darlin Drop, DO   650 mg at 07/08/22 2139   albuterol (PROVENTIL) (2.5 MG/3ML) 0.083% nebulizer solution 2.5 mg  2.5 mg Nebulization Q6H PRN Darlin Drop, DO        allopurinol (ZYLOPRIM) tablet 100 mg  100 mg Oral Daily Dow Adolph N, DO   100 mg at 07/10/22 1029   amLODipine (NORVASC) tablet 2.5 mg  2.5 mg Oral QHS Leandro Reasoner Tublu, MD   2.5 mg at 07/09/22 2234   aspirin EC tablet 81 mg  81 mg Oral Daily Darlin Drop, DO   81 mg at 07/10/22 1030   atorvastatin (LIPITOR) tablet 10 mg  10 mg Oral Daily Dow Adolph N, DO   10 mg at 07/10/22 1030   cholecalciferol (VITAMIN D3) 25 MCG (1000 UNIT) tablet 1,000 Units  1,000 Units Oral Daily Dow Adolph N, DO   1,000 Units at 07/10/22 1030   enoxaparin (LOVENOX) injection 40 mg  40 mg Subcutaneous Q24H Hall, Carole N, DO   40 mg at 07/10/22 1029   escitalopram (LEXAPRO) tablet 10 mg  10 mg Oral Daily Maryagnes Amos, FNP   10 mg at 07/10/22 1029   hydrALAZINE (APRESOLINE) injection 10 mg  10 mg Intravenous Once Charlynne Pander, MD       lisinopril (ZESTRIL) tablet 40 mg  40 mg Oral q AM Dow Adolph N, DO   40 mg at 07/10/22 1030   loratadine (CLARITIN) tablet 10 mg  10 mg Oral Daily Dow Adolph N, DO   10 mg at 07/10/22 1030   melatonin tablet 5 mg  5 mg Oral QHS PRN Dow Adolph N, DO   5 mg at 07/09/22 2139   mometasone-formoterol (DULERA) 200-5 MCG/ACT inhaler 2 puff  2 puff Inhalation BID Dow Adolph N, DO   2 puff at 07/10/22 0751   multivitamin with minerals tablet 1 tablet  1 tablet Oral Daily Dow Adolph N, DO   1 tablet at 07/10/22 1031   naphazoline-pheniramine (NAPHCON-A) 0.025-0.3 % ophthalmic solution 1 drop  1 drop Both Eyes TID PRN Darlin Drop, DO       Oral care mouth rinse  15 mL Mouth Rinse PRN Narda Bonds, MD       pantoprazole (PROTONIX) EC tablet 40 mg  40 mg Oral Daily Dow Adolph N, DO   40 mg at 07/10/22 1029   polyethylene glycol (MIRALAX / GLYCOLAX) packet 17 g  17 g Oral Daily PRN Dow Adolph N, DO       pregabalin (LYRICA) capsule 150 mg  150 mg Oral BID Dow Adolph N, DO   150 mg at 07/10/22 1030   prochlorperazine (COMPAZINE) injection 5 mg  5  mg Intravenous Q6H PRN Dow Adolph N, DO       SUMAtriptan (IMITREX) tablet 25 mg  25 mg Oral Q2H PRN Dow Adolph N, DO   25 mg at 07/08/22 2139   traMADol (ULTRAM) tablet 50 mg  50 mg Oral TID Dow Adolph N, DO   50 mg at 07/10/22 1030     Discharge Medications: Please see discharge summary for a list of discharge medications.  Relevant Imaging Results:  Relevant Lab Results:   Additional Information SSN: [id number]  Catalina Pizza Johne Buckle, LCSWA

## 2022-07-10 NOTE — Progress Notes (Signed)
PROGRESS NOTE    Sandra Brennan  ZOX:[id number] DOB: October 19, 1945 DOA: 07/06/2022 PCP: Anne Ng, NP   Brief Narrative: Sandra Brennan is a 77 y.o. female with a history of morbid obesity, fibromyalgia, anxiety, depression, migraines, OSA on CPAP, diabetes mellitus type 2, hypertension, hyperlipidemia.  Patient presents secondary to recurrent lightheadedness with concern for presyncopal episode.  Workup negative. Patient managed with IV fluids.  PT/OT consulted with recommendation for SNF on discharge.   Assessment and Plan:  Presyncope Unclear etiology. Presumed possibly secondary to hypovolemia. Orthostatic vitals appear to have been negative. Transthoracic Echocardiogram significant for normal EF with no aortic valve stenosis/regurgitation noted. PT/OT recommending SNF. -Continue telemetry  Lightheadedness Presumed secondary to hypovolemia. CT head without acute process noted. Orthostatic vitals negative.  Primary hypertension Normotensive. -Continue lisinopril  HFpEF Noted. Patient is not fluid overloaded at this time. Patient stared on IV fluids for concern for hypovolemia. Hold lasix.  Elevated creatinine Baseline creatinine of about 1. Creatinine of 1.07 on admission with trend upward to 1.28. Unsure if related to IV fluids vs lisinopril use while possibly dehydrated. Creatinine improved to 1.15 with IV fluids.  Depressed mood In setting of multiple family deaths over the past year. Psychiatry was consulted this admission and recommended grief counseling, adult daycare for social interaction with peers and to continue Lexapro 10 mg daily.  Hypokalemia Mild. Potassium of 3.4. Resolved with potassium supplementation.  Diabetes mellitus type 2 Well controlled. Recent hemoglobin A1C of 5.6%. patient is managed on semaglutide as an outpatient, which was held on admission. -Carb modified diet  Mitral valve mobile density Noted on Transthoracic Echocardiogram.  Patient without clinical concern for endocarditis.   Morbid obesity Estimated body mass index is 55.39 kg/m as calculated from the following:   Height as of this encounter: 4\' 11"  (1.499 m).   Weight as of this encounter: 124.4 kg.   DVT prophylaxis: Lovenox Code Status:   Code Status: Full Code Family Communication: None at bedside Disposition Plan: Discharge to SNF pending bed availability. Medically stable for discharge.   Consultants:  Psychiatry  Procedures:  None  Antimicrobials: None    Subjective: Lightheadedness is improved today. No issues overnight. Leg pain is also slightly improved.  Objective: BP (!) 148/72 (BP Location: Left Arm)   Pulse 73   Temp 98.8 F (37.1 C) (Oral)   Resp 15   Ht 4\' 11"  (1.499 m)   Wt 124.4 kg   SpO2 97%   BMI 55.39 kg/m   Examination:  General exam: Appears calm and comfortable  Respiratory system: Clear to auscultation. Respiratory effort normal. Cardiovascular system: S1 & S2 heard, RRR. Gastrointestinal system: Abdomen is nondistended, soft and nontender. No organomegaly or masses felt. Normal bowel sounds heard. Central nervous system: Alert and oriented. Musculoskeletal: No calf tenderness Psychiatry: Judgement and insight appear normal. Mood & affect appropriate.    Data Reviewed: I have personally reviewed following labs and imaging studies  CBC Lab Results  Component Value Date   WBC 7.0 07/07/2022   RBC 4.16 07/07/2022   HGB 11.8 (L) 07/07/2022   HCT 36.1 07/07/2022   MCV 86.8 07/07/2022   MCH 28.4 07/07/2022   PLT 204 07/07/2022   MCHC 32.7 07/07/2022   RDW 13.0 07/07/2022   LYMPHSABS 2.4 08/25/2021   MONOABS 0.4 08/25/2021   EOSABS 0.1 08/25/2021   BASOSABS 0.0 08/25/2021     Last metabolic panel Lab Results  Component Value Date   NA 137 07/10/2022  K 4.1 07/10/2022   CL 107 07/10/2022   CO2 24 07/10/2022   BUN 14 07/10/2022   CREATININE 1.15 (H) 07/10/2022   GLUCOSE 91 07/10/2022    GFRNONAA 50 (L) 07/10/2022   GFRAA 46 (L) 03/07/2019   CALCIUM 8.2 (L) 07/10/2022   PHOS 3.4 07/07/2022   PROT 7.3 07/06/2022   ALBUMIN 3.5 07/06/2022   LABGLOB 2.6 07/14/2014   AGRATIO 1.7 07/14/2014   BILITOT 0.5 07/06/2022   ALKPHOS 92 07/06/2022   AST 17 07/06/2022   ALT 12 07/06/2022   ANIONGAP 6 07/10/2022    GFR: Estimated Creatinine Clearance: 50.5 mL/min (A) (by C-G formula based on SCr of 1.15 mg/dL (H)).  No results found for this or any previous visit (from the past 240 hour(s)).    Radiology Studies: No results found.    LOS: 4 days    Jacquelin Hawking, MD [facility] 07/10/2022, 2:29 PM   If 7PM-7AM, please contact night-coverage [email or web address]

## 2022-07-10 NOTE — Progress Notes (Signed)
Physical Therapy Treatment Patient Details Name: Sandra Brennan MRN: [id number] DOB: [birth year] Today's Date: 07/10/2022   History of Present Illness Pt is a 77 y/o F admitted on 07/06/22 after presenting with c/o dizziness & ear pain & fullness. Pt noted to be orthostatic on admission. Pt is being treated for intermittent dizziness/presyncope, weakness, & decreased p.o. intake. PMH: acute nontraumatic kidney injury, anemia, arthritis, asthma, chest pain, chronic LBP, COPD, ankle fx, gout, HTN, migraines, obesity, sleep apnea, syncope, DM2    PT Comments    Pt greeted supine in bed and agreeable to session with continued progress towards acute goals with session focused on functional transfers and standing tolerance with RW support. Pt able to complete bed mobility with effortful movements and min guard for safety and power up to stand with RW support without physical assist. Pt without c/o dizziness with positional changes this session and pt without tremors. Pt able to complete short gait in room with wide BOS and heavy reliance on RW support with min A to steady and maintain standing with and without UE support >24mins. Current plan remains appropriate to address deficits and maximize functional independence and decrease caregiver burden. Pt continues to benefit from skilled PT services to progress toward functional mobility goals.    Recommendations for follow up therapy are one component of a multi-disciplinary discharge planning process, led by the attending physician.  Recommendations may be updated based on patient status, additional functional criteria and insurance authorization.  Follow Up Recommendations  Can patient physically be transported by private vehicle: No    Assistance Recommended at Discharge Frequent or constant Supervision/Assistance  Patient can return home with the following A lot of help with walking and/or transfers;A lot of help with bathing/dressing/bathroom;Assistance  with cooking/housework;Direct supervision/assist for financial management;Assist for transportation;Help with stairs or ramp for entrance   Equipment Recommendations  None recommended by PT (TBD in next venue)    Recommendations for Other Services       Precautions / Restrictions Precautions Precautions: Fall Restrictions Weight Bearing Restrictions: No     Mobility  Bed Mobility Overal bed mobility: Needs Assistance Bed Mobility: Sit to Supine       Sit to supine: Min guard, HOB elevated   General bed mobility comments: increased time and effort, use of bedrails    Transfers Overall transfer level: Needs assistance Equipment used: Rolling walker (2 wheels) Transfers: Sit to/from Stand Sit to Stand: Min guard           General transfer comment: extra time to power up and transition hands to RW but no physical assist needed    Ambulation/Gait Ambulation/Gait assistance: Min assist Gait Distance (Feet): 5 Feet Assistive device: Rolling walker (2 wheels) Gait Pattern/deviations: Step-through pattern, Wide base of support       General Gait Details: slow gait from EOB to chair with wide BOS and heavy UE use on RW   Stairs             Wheelchair Mobility    Modified Peavy (Stroke Patients Only)       Balance Overall balance assessment: Needs assistance Sitting-balance support: Feet supported, Bilateral upper extremity supported Sitting balance-Leahy Scale: Fair     Standing balance support: Bilateral upper extremity supported, During functional activity, Reliant on assistive device for balance Standing balance-Leahy Scale: Fair Standing balance comment: able to static stand without UE support  Cognition Arousal/Alertness: Awake/alert Behavior During Therapy: WFL for tasks assessed/performed Overall Cognitive Status: Within Functional Limits for tasks assessed                                           Exercises      General Comments        Pertinent Vitals/Pain Pain Assessment Pain Assessment: Faces Faces Pain Scale: Hurts a little bit Pain Location: BLE to touch Pain Descriptors / Indicators: Grimacing, Guarding, Discomfort Pain Intervention(s): Monitored during session, Limited activity within patient's tolerance    Home Living                          Prior Function            PT Goals (current goals can now be found in the care plan section) Acute Rehab PT Goals Patient Stated Goal: get better PT Goal Formulation: With patient Time For Goal Achievement: 07/23/22 Progress towards PT goals: Progressing toward goals    Frequency    Min 3X/week      PT Plan Current plan remains appropriate    Co-evaluation              AM-PAC PT "6 Clicks" Mobility   Outcome Measure  Help needed turning from your back to your side while in a flat bed without using bedrails?: A Little Help needed moving from lying on your back to sitting on the side of a flat bed without using bedrails?: A Lot Help needed moving to and from a bed to a chair (including a wheelchair)?: A Little Help needed standing up from a chair using your arms (e.g., wheelchair or bedside chair)?: A Little Help needed to walk in hospital room?: A Lot Help needed climbing 3-5 steps with a railing? : Total 6 Click Score: 14    End of Session   Activity Tolerance: Patient limited by fatigue;Patient tolerated treatment well Patient left: in chair;with call bell/phone within reach Nurse Communication: Mobility status PT Visit Diagnosis: Muscle weakness (generalized) (M62.81);Difficulty in walking, not elsewhere classified (R26.2);Other abnormalities of gait and mobility (R26.89);Unsteadiness on feet (R26.81)     Time: 1308-6578 PT Time Calculation (min) (ACUTE ONLY): 17 min  Charges:  $Therapeutic Activity: 8-22 mins                     [name] R. PTA Acute Rehabilitation  Services Office: [phone]   Catalina Antigua 07/10/2022, 1:17 PM

## 2022-07-10 NOTE — TOC Initial Note (Addendum)
Transition of Care [facility]) - Initial/Assessment Note    Patient Details  Name: Sandra Brennan MRN: [id number] Date of Birth: 08-06-1945  Transition of Care [facility]) CM/SW Contact:    Ralene Bathe, LCSWA Phone Number: 07/10/2022, 4:36 PM  Clinical Narrative:                 CSW received consult for possible SNF placement at time of discharge. CSW spoke with patient at bedside.  Patient expressed understanding of PT recommendation and is agreeable to SNF placement at time of discharge. Patient reports preference for [facility] and was open to LCSW sending referral to other facilities in [facility]. CSW discussed insurance authorization process and provided Medicare SNF ratings list. CSW will send out referrals for review and provide bed offers as available.  Addendum 17:21- LCSW received a call from [facility], the patient's facility of choice.  The facility can accept the patient.  LCSW initiated insurance authorization.  Skilled Nursing Rehab Facilities-   [email or web address]   Ratings out of 5 stars (5 the highest)   Name Address  Phone # Quality Care Staffing Health Inspection Overall  [facility] [address] [phone] 2 1 5 4   [facility] [address], [place] [phone] 4 1 3 2   [facility] [address], [place] [phone] 2 1 1 1   [facility] [address], [place] [phone] 4 1 3 2   [facility]  [address], [place] [phone] 3 2 5 5   [facility] [place] [address], [facility] [phone] 2 1 2 1   [facility] [address] [place] [phone] 4 1 2 1   [facility] [phone] [address], [place] [phone] 2 4 3 3   [facility] ([facility]) [address], [place] [phone] 3 2 2 2   [facility] [address], [place] [phone] 1 2 1 1   [facility] ([place]) [address],  [place] [phone] 3 1 1 1   [facility] [address] [place] [phone] 4 3 4 4   [facility] [address] [place] [phone] 3 4 3 3           [facility] [address] [place] [phone]      [facility], [place] [address], [place] [phone] 1 1 2 1   [facility] [address] [place] [phone] 2 2 4 4   [facility] [address], [place] [phone] 2 1 4 3   [facility] [address], [place] [phone] 3 3 3 3           [address] (no [facility]) [address], [place] [phone] 4 4 5 5   [facility] (No [employer]) [address] Laver[place] [phone] 2 2 4 4   [facility] [address], [place] [phone] 2 1 2 1   [facility] (No [employer]) [address] [place], [place] [phone] 5 5 5 5   [facility] [address], [facility] [phone] 2 3 5 5   [facility] [address], [place] [phone] 2 1 1 1   [place] [address] [place] [phone] 5 2 5 5   [facility]  [address], [place] [phone] 2 2 2 2   [facility] [address], [place] [phone] 4 2 1 1   [facility] [address] [place], [place] [phone] 2 2 3 3           [facility] [address] [place]  [phone] 1 1 1 1   [facility] [address], [place]lyn Clines  [phone] 2 3 3 3   [facility] (No [employer]) [address], [place] [phone] 2 1 3 2   [facility]) [address], [place]an [phone] 1 1 1 1   [facility] [address] [place] [phone] 3 2 5 5   [facility] [address] [place], [place] [phone] [facility] [address] [place], [place] [phone] 4 4 5 5   [facility])  [address] [place] [phone] 2 1 2 1   [facility]) [address] [place], [place] [phone]  1 4 3   [facility]  [place] [address] [place] [phone] 3 5 4 5   [address] [place], [place] [phone] 3 2 2 2   [facility]) [address] [place] [phone] 2 1 3 2     Expected Discharge Plan: Skilled Nursing Facility Barriers to Discharge: SNF Pending bed offer, Insurance Authorization   Patient Goals and CMS Choice   CMS Medicare.gov Compare Post Acute Care list provided to:: Patient Choice offered to / list presented to : Patient      Expected Discharge Plan and Services In-house Referral: Clinical Social Work     Living arrangements for the past 2 months: Apartment                                      Prior Living Arrangements/Services Living arrangements for the past 2 months: Apartment Lives with:: Self Patient language and need for interpreter reviewed:: Yes Do you feel safe going back to the place where you live?: Yes      Need for Family Participation in Patient Care: Yes (Comment) Care giver support system in place?: Yes (comment)   Criminal Activity/Legal Involvement Pertinent to Current Situation/Hospitalization: No - Comment as needed  Activities of Daily Living Home Assistive Devices/Equipment: Walker (specify type), CPAP ADL Screening (condition at time of admission) Patient's cognitive ability adequate to safely complete daily activities?: Yes Is the patient deaf or have difficulty hearing?: No Does the patient have difficulty seeing, even when wearing glasses/contacts?: No Does the patient have difficulty concentrating, remembering, or making decisions?: No Patient able to express need for assistance with ADLs?: Yes Does the patient have difficulty dressing or bathing?: No Independently performs ADLs?: Yes (appropriate for developmental age) Does the patient have difficulty walking or climbing stairs?: Yes Weakness of Legs: Both Weakness of Arms/Hands: None  Permission Sought/Granted    Permission granted to share information with : Yes, Verbal Permission Granted     Permission granted to share info w AGENCY: SNFs        Emotional Assessment Appearance:: Appears stated age Attitude/Demeanor/Rapport: Engaged Affect (typically observed): Adaptable, Pleasant Orientation: : Oriented to Situation, Oriented to  Time, Oriented to Place, Oriented to Self Alcohol / Substance Use: Not Applicable Psych Involvement: Yes (comment) (Psychiatry consult for suspected major depression decrease or intent depressed mood and bereavement.)  Admission diagnosis:  Hypertensive urgency [I16.0] Near syncope [R55] Patient Active Problem List   Diagnosis Date Noted   Near syncope 07/06/2022   Chronic constipation 06/04/2022   Bilateral leg edema 01/01/2022   Chronic pain syndrome 05/31/2021   Lumbar radiculitis 04/18/2021   CKD (chronic kidney disease) stage 3, GFR 30-59 ml/min (HCC) 08/20/2020  Knee pain 08/20/2020   Intertrigo 03/27/2020   Normocytic anemia 08/23/2019   Polyethylene liner wear following total hip arthroplasty requiring isolated polyethylene liner exchange (HCC) 07/12/2019   Depression, recurrent (HCC) 07/12/2018   Migraine without aura and without status migrainosus, not intractable 09/10/2017   Post-traumatic osteoarthritis of right ankle 06/09/2016   Spondylosis without myelopathy or radiculopathy, lumbar region 06/09/2016   Cervical spinal stenosis 06/09/2016   Spinal stenosis, lumbar region, without neurogenic claudication 06/09/2016   Chronic gouty arthritis 06/04/2016   Vitamin D deficiency 06/02/2016   Hyperlipidemia 06/02/2016   Closed right ankle fracture 05/01/2016   Colon polyps 05/01/2016   Fibromyalgia syndrome 05/01/2016   Pharyngeal dysphagia 06/21/2015   Morbid obesity (HCC) 05/06/2013   Osteoarthritis of left hip 08/05/2012   Type 2 diabetes mellitus with diabetic polyneuropathy, without long-term current use of insulin (HCC) 08/05/2012    Hypertension 08/05/2012   Asthma, chronic 08/05/2012   OSA (obstructive sleep apnea) 08/05/2012   PCP:  Anne Ng, NP Pharmacy:   [facility] [phone] [place], [place] - [address] AT [facility] [address] [place] [address] Phone: [phone] Fax: [phone]     Social Determinants of Health (SDOH) Social History: SDOH Screenings   Food Insecurity: No Food Insecurity (07/06/2022)  Housing: Low Risk  (07/06/2022)  Transportation Needs: No Transportation Needs (07/06/2022)  Utilities: Not At Risk (07/06/2022)  Alcohol Screen: Low Risk  (11/26/2021)  Depression (PHQ2-9): High Risk (06/04/2022)  Financial Resource Strain: Low Risk  (11/26/2021)  Physical Activity: Insufficiently Active (11/26/2021)  Social Connections: Moderately Isolated (11/26/2021)  Stress: No Stress Concern Present (11/26/2021)  Tobacco Use: Medium Risk (07/06/2022)   SDOH Interventions:     Readmission Risk Interventions     No data to display

## 2022-07-11 ENCOUNTER — Telehealth: Payer: Self-pay

## 2022-07-11 DIAGNOSIS — G894 Chronic pain syndrome: Secondary | ICD-10-CM | POA: Diagnosis not present

## 2022-07-11 DIAGNOSIS — Z7401 Bed confinement status: Secondary | ICD-10-CM | POA: Diagnosis not present

## 2022-07-11 DIAGNOSIS — J302 Other seasonal allergic rhinitis: Secondary | ICD-10-CM | POA: Diagnosis not present

## 2022-07-11 DIAGNOSIS — E559 Vitamin D deficiency, unspecified: Secondary | ICD-10-CM | POA: Diagnosis not present

## 2022-07-11 DIAGNOSIS — N1831 Chronic kidney disease, stage 3a: Secondary | ICD-10-CM | POA: Diagnosis not present

## 2022-07-11 DIAGNOSIS — R404 Transient alteration of awareness: Secondary | ICD-10-CM | POA: Diagnosis not present

## 2022-07-11 DIAGNOSIS — R55 Syncope and collapse: Secondary | ICD-10-CM | POA: Diagnosis not present

## 2022-07-11 DIAGNOSIS — R5381 Other malaise: Secondary | ICD-10-CM | POA: Diagnosis not present

## 2022-07-11 DIAGNOSIS — Z743 Need for continuous supervision: Secondary | ICD-10-CM | POA: Diagnosis not present

## 2022-07-11 DIAGNOSIS — M25511 Pain in right shoulder: Secondary | ICD-10-CM | POA: Diagnosis not present

## 2022-07-11 DIAGNOSIS — M5416 Radiculopathy, lumbar region: Secondary | ICD-10-CM | POA: Diagnosis not present

## 2022-07-11 DIAGNOSIS — J449 Chronic obstructive pulmonary disease, unspecified: Secondary | ICD-10-CM | POA: Diagnosis not present

## 2022-07-11 DIAGNOSIS — J45909 Unspecified asthma, uncomplicated: Secondary | ICD-10-CM | POA: Diagnosis not present

## 2022-07-11 DIAGNOSIS — M19011 Primary osteoarthritis, right shoulder: Secondary | ICD-10-CM | POA: Diagnosis not present

## 2022-07-11 DIAGNOSIS — E1122 Type 2 diabetes mellitus with diabetic chronic kidney disease: Secondary | ICD-10-CM | POA: Diagnosis not present

## 2022-07-11 DIAGNOSIS — E1142 Type 2 diabetes mellitus with diabetic polyneuropathy: Secondary | ICD-10-CM | POA: Diagnosis not present

## 2022-07-11 DIAGNOSIS — R2689 Other abnormalities of gait and mobility: Secondary | ICD-10-CM | POA: Diagnosis not present

## 2022-07-11 DIAGNOSIS — D649 Anemia, unspecified: Secondary | ICD-10-CM | POA: Diagnosis not present

## 2022-07-11 DIAGNOSIS — K5900 Constipation, unspecified: Secondary | ICD-10-CM | POA: Diagnosis not present

## 2022-07-11 DIAGNOSIS — R1313 Dysphagia, pharyngeal phase: Secondary | ICD-10-CM | POA: Diagnosis not present

## 2022-07-11 DIAGNOSIS — G4733 Obstructive sleep apnea (adult) (pediatric): Secondary | ICD-10-CM | POA: Diagnosis not present

## 2022-07-11 DIAGNOSIS — R262 Difficulty in walking, not elsewhere classified: Secondary | ICD-10-CM | POA: Diagnosis not present

## 2022-07-11 DIAGNOSIS — K5909 Other constipation: Secondary | ICD-10-CM | POA: Diagnosis not present

## 2022-07-11 DIAGNOSIS — M1A00X0 Idiopathic chronic gout, unspecified site, without tophus (tophi): Secondary | ICD-10-CM | POA: Diagnosis not present

## 2022-07-11 DIAGNOSIS — R531 Weakness: Secondary | ICD-10-CM | POA: Diagnosis not present

## 2022-07-11 DIAGNOSIS — E861 Hypovolemia: Secondary | ICD-10-CM | POA: Diagnosis not present

## 2022-07-11 DIAGNOSIS — M25519 Pain in unspecified shoulder: Secondary | ICD-10-CM | POA: Diagnosis not present

## 2022-07-11 DIAGNOSIS — M4802 Spinal stenosis, cervical region: Secondary | ICD-10-CM | POA: Diagnosis not present

## 2022-07-11 DIAGNOSIS — M797 Fibromyalgia: Secondary | ICD-10-CM | POA: Diagnosis not present

## 2022-07-11 DIAGNOSIS — L304 Erythema intertrigo: Secondary | ICD-10-CM | POA: Diagnosis not present

## 2022-07-11 DIAGNOSIS — M19012 Primary osteoarthritis, left shoulder: Secondary | ICD-10-CM | POA: Diagnosis not present

## 2022-07-11 DIAGNOSIS — I2510 Atherosclerotic heart disease of native coronary artery without angina pectoris: Secondary | ICD-10-CM | POA: Diagnosis not present

## 2022-07-11 DIAGNOSIS — M6281 Muscle weakness (generalized): Secondary | ICD-10-CM | POA: Diagnosis not present

## 2022-07-11 DIAGNOSIS — H9313 Tinnitus, bilateral: Secondary | ICD-10-CM | POA: Diagnosis not present

## 2022-07-11 DIAGNOSIS — E1022 Type 1 diabetes mellitus with diabetic chronic kidney disease: Secondary | ICD-10-CM | POA: Diagnosis not present

## 2022-07-11 DIAGNOSIS — N183 Chronic kidney disease, stage 3 unspecified: Secondary | ICD-10-CM | POA: Diagnosis not present

## 2022-07-11 DIAGNOSIS — M810 Age-related osteoporosis without current pathological fracture: Secondary | ICD-10-CM | POA: Diagnosis not present

## 2022-07-11 DIAGNOSIS — I5032 Chronic diastolic (congestive) heart failure: Secondary | ICD-10-CM | POA: Diagnosis not present

## 2022-07-11 DIAGNOSIS — I10 Essential (primary) hypertension: Secondary | ICD-10-CM | POA: Diagnosis not present

## 2022-07-11 DIAGNOSIS — E785 Hyperlipidemia, unspecified: Secondary | ICD-10-CM | POA: Diagnosis not present

## 2022-07-11 DIAGNOSIS — R0602 Shortness of breath: Secondary | ICD-10-CM | POA: Diagnosis not present

## 2022-07-11 DIAGNOSIS — K219 Gastro-esophageal reflux disease without esophagitis: Secondary | ICD-10-CM | POA: Diagnosis not present

## 2022-07-11 MED ORDER — TRAMADOL HCL 50 MG PO TABS: 50.0000 mg | ORAL_TABLET | Freq: Three times a day (TID) | ORAL | 0 refills | Status: DC

## 2022-07-11 MED ORDER — PREGABALIN 150 MG PO CAPS: 150.0000 mg | ORAL_CAPSULE | Freq: Two times a day (BID) | ORAL | 0 refills | Status: DC

## 2022-07-11 NOTE — Discharge Summary (Addendum)
Physician Discharge Summary   Patient: Sandra Brennan MRN: [id number] DOB: 1945-07-18  Admit date:     07/06/2022  Discharge date: 07/11/22  Discharge Physician: Jacquelin Hawking, MD   PCP: Anne Ng, NP   Recommendations at discharge:  Hospital follow-up with PCP in 1 week Psychiatry/behavioral health follow-up for depression  Discharge Diagnoses: Principal Problem:   Near syncope  Resolved Problems:   * No resolved hospital problems. *  Hospital Course: Sandra Brennan is a 77 y.o. female with a history of morbid obesity, fibromyalgia, anxiety, depression, migraines, OSA on CPAP, diabetes mellitus type 2, hypertension, hyperlipidemia.  Patient presents secondary to recurrent lightheadedness with concern for presyncopal episode.  Workup negative. Patient managed with IV fluids.  PT/OT consulted with recommendation for SNF on discharge.  Assessment and Plan:  Presyncope Unclear etiology. Presumed possibly secondary to hypovolemia. Orthostatic vitals appear to have been negative. Transthoracic Echocardiogram significant for normal EF with no aortic valve stenosis/regurgitation noted. PT/OT recommending SNF.   Lightheadedness Presumed secondary to hypovolemia. CT head without acute process noted. Orthostatic vitals negative.   Primary hypertension Normotensive. Continue lisinopril and amlodipine.   HFpEF Noted. Patient is not fluid overloaded at this time. Patient stared on IV fluids for concern for hypovolemia.  Resume Lasix on discharge.   Elevated creatinine Baseline creatinine of about 1. Creatinine of 1.07 on admission with trend upward to 1.28. Unsure if related to IV fluids vs lisinopril use while possibly dehydrated. Creatinine improved to 1.15 with IV fluids.   Depressed mood In setting of multiple family deaths over the past year. Psychiatry was consulted this admission and recommended grief counseling, adult daycare for social interaction with peers and to  continue Lexapro 10 mg daily.   Hypokalemia Mild. Potassium of 3.4. Resolved with potassium supplementation.   Diabetes mellitus type 2 Well controlled. Recent hemoglobin A1C of 5.6%. patient is managed on semaglutide as an outpatient, which was held on admission.  Resume outpatient regimen.  Carb modified diet.   Mitral valve mobile density Noted on Transthoracic Echocardiogram. Patient without clinical concern for endocarditis.   OSA Continue CPAP   Morbid obesity Estimated body mass index is 55.44 kg/m as calculated from the following:   Height as of this encounter: 4\' 11"  (1.499 m).   Weight as of this encounter: 124.5 kg.   Consultants: Psychiatry Procedures performed: None  Disposition: Nursing home Diet recommendation: Carb modified diet   DISCHARGE MEDICATION: Allergies as of 07/11/2022       Reactions   Cymbalta [duloxetine Hcl] Anaphylaxis   Pineapple Shortness Of Breath   Sulfa Antibiotics Hives, Shortness Of Breath, Itching   Influenza Vaccines Hives, Itching   Other Swelling, Other (See Comments)   Pork barbeque- Made the face swell   Penicillins Hives   Theophyllines Hives        Medication List     STOP taking these medications    escitalopram 10 MG tablet Commonly known as: LEXAPRO   UNKNOWN TO PATIENT       TAKE these medications    albuterol 108 (90 Base) MCG/ACT inhaler Commonly known as: VENTOLIN HFA INHALE 1 PUFF INTO THE LUNGS EVERY 6 (SIX) HOURS AS NEEDED FOR SHORTNESS OF BREATH OR WHEEZING.   allopurinol 100 MG tablet Commonly known as: ZYLOPRIM TAKE 1 TABLET BY MOUTH EVERY DAY   amLODipine 5 MG tablet Commonly known as: NORVASC Take 1 tablet (5 mg total) by mouth every evening. What changed: when to take this   aspirin  EC 81 MG tablet Take 81 mg by mouth daily.   atorvastatin 10 MG tablet Commonly known as: LIPITOR TAKE 1 TABLET BY MOUTH  DAILY AT 6 PM. What changed: when to take this   cetirizine 10 MG  tablet Commonly known as: ZYRTEC Take 10 mg by mouth in the morning.   cholecalciferol 25 MCG (1000 UNIT) tablet Commonly known as: VITAMIN D3 Take 1,000 Units by mouth daily.   furosemide 20 MG tablet Commonly known as: LASIX TAKE 1/2 TABLET BY MOUTH DAILY   lisinopril 40 MG tablet Commonly known as: ZESTRIL TAKE 1 TABLET BY MOUTH EVERY DAY What changed: when to take this   multivitamin tablet Take 1 tablet by mouth daily with breakfast.   Nurtec 75 MG Tbdp Generic drug: Rimegepant Sulfate Take 1 tablet (75 mg total) by mouth as needed (take 1 at onset of headache, max is 75 in 24 hours). What changed: reasons to take this   omeprazole 20 MG capsule Commonly known as: PRILOSEC TAKE 1 CAPSULE BY MOUTH EVERY DAY What changed:  how much to take when to take this   Opcon-A 0.027-0.315 % Soln Generic drug: Naphazoline-Pheniramine Place 1 drop into both eyes 3 (three) times daily as needed (for itching).   Ozempic (0.25 or 0.5 MG/DOSE) 2 MG/3ML Sopn Generic drug: Semaglutide(0.25 or 0.5MG /DOS) Inject 0.5 mg into the skin once a week. What changed: when to take this   pregabalin 150 MG capsule Commonly known as: LYRICA Take 1 capsule (150 mg total) by mouth 2 (two) times daily. What changed: when to take this   Symbicort 160-4.5 MCG/ACT inhaler Generic drug: budesonide-formoterol INHALE 2 PUFFS INTO THE LUNGS IN THE MORNING AND AT BEDTIME. RINSE MOUTH AFTER EACH USE What changed: how much to take   traMADol 50 MG tablet Commonly known as: ULTRAM Take 1 tablet (50 mg total) by mouth 3 (three) times daily.   Tylenol 8 Hour Arthritis Pain 650 MG CR tablet Generic drug: acetaminophen Take 1,300 mg by mouth every 8 (eight) hours as needed (for headaches).        Contact information for follow-up providers     Nche, Bonna Gains, NP. Schedule an appointment as soon as possible for a visit in 1 week(s).   Specialty: Internal Medicine Why: For hospital  follow-up Contact information: [address] [place] [address] [phone]              Contact information for after-discharge care     Destination     [facility] Preferred SNF .   Service: Skilled Nursing Contact information: [address] [place] [address] [phone]                    Discharge Exam: BP (!) 141/71 (BP Location: Left Arm)   Pulse 72   Temp 98.4 F (36.9 C) (Oral)   Resp 19   Ht 4\' 11"  (1.499 m)   Wt 124.5 kg   SpO2 99%   BMI 55.44 kg/m   General exam: Appears calm and comfortable Respiratory system: Clear to auscultation. Respiratory effort normal. Cardiovascular system: S1 & S2 heard, RRR.  Gastrointestinal system: Abdomen is nondistended, soft and nontender. Normal bowel sounds heard. Central nervous system: Alert and oriented. Psychiatry: Judgement and insight appear normal.  Condition at discharge: stable  The results of significant diagnostics from this hospitalization (including imaging, microbiology, ancillary and laboratory) are listed below for reference.   Imaging Studies: ECHOCARDIOGRAM COMPLETE  Result Date:  07/07/2022    ECHOCARDIOGRAM REPORT   Patient Name:   Sandra Brennan Date of Exam: 07/07/2022 Medical Rec #:  [id number]       Height:       59.0 in Accession #:    [id number]      Weight:       263.0 lb Date of Birth:  07/15/1945        BSA:          2.072 m Patient Age:    77 years        BP:           119/64 mmHg Patient Gender: F               HR:           71 bpm. Exam Location:  Inpatient Procedure: 2D Echo, Cardiac Doppler, Color Doppler and Intracardiac            Opacification Agent Indications:    Dyspnea  History:        Patient has prior history of Echocardiogram examinations, most                 recent 03/31/2016. Previous Myocardial Infarction, COPD,                 Signs/Symptoms:Dyspnea; Risk Factors:Hypertension and Diabetes.  Sonographer:    Raeford Razor  Sonographer#2:  Milbert Coulter Referring Phys: Raynaldo Opitz CHATTERJEE  Sonographer Comments: Image acquisition challenging due to patient body habitus. IMPRESSIONS  1. Left ventricular ejection fraction, by estimation, is 55 to 60%. The left ventricle has normal function. The left ventricle has no regional wall motion abnormalities. Left ventricular diastolic parameters are consistent with Grade I diastolic dysfunction (impaired relaxation).  2. Right ventricular systolic function is normal. The right ventricular size is normal.  3. Left atrial size was mildly dilated.  4. Right atrial size was mildly dilated.  5. On the parasternal long axis images, there is a mobile density on the anterior leaflet of the mitral valve, this may be mobile calcium but if clinical suspicion exists for endocarditis would recommend TEE to further evalaute. The mitral valve is normal in structure. No evidence of mitral valve regurgitation. No evidence of mitral stenosis.  6. The aortic valve is tricuspid. There is mild calcification of the aortic valve. Aortic valve regurgitation is not visualized. Aortic valve sclerosis/calcification is present, without any evidence of aortic stenosis.  7. The inferior vena cava is normal in size with greater than 50% respiratory variability, suggesting right atrial pressure of 3 mmHg. Conclusion(s)/Recommendation(s): On the parasternal long axis images, there is a mobile density on the anterior leaflet of the mitral valve, this may be mobile calcium but if clinical suspicion exists for endocarditis would recommend TEE to further evalaute. FINDINGS  Left Ventricle: Left ventricular ejection fraction, by estimation, is 55 to 60%. The left ventricle has normal function. The left ventricle has no regional wall motion abnormalities. Definity contrast agent was given IV to delineate the left ventricular  endocardial borders. The left ventricular internal cavity size was normal in size. There is no left  ventricular hypertrophy. Left ventricular diastolic parameters are consistent with Grade I diastolic dysfunction (impaired relaxation). Right Ventricle: The right ventricular size is normal. No increase in right ventricular wall thickness. Right ventricular systolic function is normal. Left Atrium: Left atrial size was mildly dilated. Right Atrium: Right atrial size was mildly dilated. Pericardium: There is no evidence of pericardial effusion. Mitral Valve:  On the parasternal long axis images, there is a mobile density on the anterior leaflet of the mitral valve, this may be mobile calcium but if clinical suspicion exists for endocarditis would recommend TEE to further evalaute. The mitral valve is normal in structure. There is mild calcification of the mitral valve leaflet(s). No evidence of mitral valve regurgitation. No evidence of mitral valve stenosis. Tricuspid Valve: The tricuspid valve is normal in structure. Tricuspid valve regurgitation is trivial. No evidence of tricuspid stenosis. Aortic Valve: The aortic valve is tricuspid. There is mild calcification of the aortic valve. Aortic valve regurgitation is not visualized. Aortic valve sclerosis/calcification is present, without any evidence of aortic stenosis. Aortic valve peak gradient measures 11.8 mmHg. Pulmonic Valve: The pulmonic valve was normal in structure. Pulmonic valve regurgitation is not visualized. No evidence of pulmonic stenosis. Aorta: The aortic root is normal in size and structure. Venous: The inferior vena cava is normal in size with greater than 50% respiratory variability, suggesting right atrial pressure of 3 mmHg. IAS/Shunts: No atrial level shunt detected by color flow Doppler.  LEFT VENTRICLE PLAX 2D LVIDd:         5.10 cm      Diastology LVIDs:         3.60 cm      LV e' medial:    6.85 cm/s LV PW:         1.00 cm      LV E/e' medial:  12.8 LV IVS:        1.00 cm      LV e' lateral:   12.00 cm/s                             LV E/e'  lateral: 7.3  LV Volumes (MOD) LV vol d, MOD A4C: 125.0 ml LV vol s, MOD A4C: 56.0 ml LV SV MOD A4C:     125.0 ml RIGHT VENTRICLE RV Basal diam:  3.50 cm RV S prime:     14.10 cm/s TAPSE (M-mode): 2.0 cm LEFT ATRIUM             Index        RIGHT ATRIUM           Index LA diam:        3.60 cm 1.74 cm/m   RA Area:     16.40 cm LA Vol (A2C):   54.7 ml 26.40 ml/m  RA Volume:   48.40 ml  23.36 ml/m LA Vol (A4C):   40.8 ml 19.69 ml/m LA Biplane Vol: 49.8 ml 24.03 ml/m  AORTIC VALVE AV Vmax:      172.00 cm/s AV Peak Grad: 11.8 mmHg LVOT Vmax:    126.00 cm/s LVOT Vmean:   78.000 cm/s LVOT VTI:     0.235 m  AORTA Ao Root diam: 2.70 cm Ao Asc diam:  2.90 cm MITRAL VALVE MV Area (PHT): 3.02 cm     SHUNTS MV Decel Time: 251 msec     Systemic VTI: 0.24 m MV E velocity: 87.90 cm/s MV A velocity: 113.00 cm/s MV E/A ratio:  0.78 Arvilla Meres MD Electronically signed by Arvilla Meres MD Signature Date/Time: 07/07/2022/3:01:40 PM    Final    CT Angio Chest/Abd/Pel for Dissection W and/or Wo Contrast  Result Date: 07/06/2022 CLINICAL DATA:  Acute aortic syndrome (AAS) suspected EXAM: CT ANGIOGRAPHY CHEST, ABDOMEN AND PELVIS TECHNIQUE: Non-contrast CT of the chest was initially obtained. Multidetector  CT imaging through the chest, abdomen and pelvis was performed using the standard protocol during bolus administration of intravenous contrast. Multiplanar reconstructed images and MIPs were obtained and reviewed to evaluate the vascular anatomy. RADIATION DOSE REDUCTION: This exam was performed according to the departmental dose-optimization program which includes automated exposure control, adjustment of the mA and/or kV according to patient size and/or use of iterative reconstruction technique. CONTRAST:  OMNIPAQUE IOHEXOL 350 MG/ML SOLN COMPARISON:  01/05/2021 FINDINGS: CTA CHEST FINDINGS Cardiovascular: Heart is normal size. Aorta is normal caliber. No dissection. No filling defects in the pulmonary arteries to  suggest pulmonary emboli. Mediastinum/Nodes: No mediastinal, hilar, or axillary adenopathy. Trachea and esophagus are unremarkable. Thyroid unremarkable. Lungs/Pleura: Lungs are clear. No focal airspace opacities or suspicious nodules. No effusions. Musculoskeletal: Chest wall soft tissues are unremarkable. No acute bony abnormality. Review of the MIP images confirms the above findings. CTA ABDOMEN AND PELVIS FINDINGS VASCULAR Aorta: Normal caliber aorta without aneurysm, dissection, vasculitis or significant stenosis. Infrarenal atherosclerosis and tortuosity. Celiac: Patent without evidence of aneurysm, dissection, vasculitis or significant stenosis. SMA: Patent without evidence of aneurysm, dissection, vasculitis or significant stenosis. Renals: Both renal arteries are patent without evidence of aneurysm, dissection, vasculitis, fibromuscular dysplasia or significant stenosis. IMA: Patent without evidence of aneurysm, dissection, vasculitis or significant stenosis. Inflow: Patent without evidence of aneurysm, dissection, vasculitis or significant stenosis. Veins: No obvious venous abnormality within the limitations of this arterial phase study. Review of the MIP images confirms the above findings. NON-VASCULAR Hepatobiliary: Small layering gallstones within the gallbladder. No focal hepatic abnormality. Pancreas: No focal abnormality or ductal dilatation. Spleen: No focal abnormality.  Normal size. Adrenals/Urinary Tract: No suspicious renal or adrenal mass. No stones or hydronephrosis. Urinary bladder decompressed and obscured by beam hardening artifact from the left hip replacement. Stomach/Bowel: Left colonic diverticulosis. No active diverticulitis. Stomach and small bowel decompressed. No bowel obstruction. Lymphatic: No adenopathy Reproductive: Lower pelvic structures obscured by beam hardening artifact from left hip replacement. Other: No free fluid or free air. Musculoskeletal: No acute bony abnormality.  Left hip replacement. Degenerative changes in the lumbar spine. Review of the MIP images confirms the above findings. IMPRESSION: No evidence of aortic aneurysm or dissection. Atherosclerotic calcifications in the infrarenal aorta. No evidence of pulmonary embolus. No acute findings in the chest, abdomen or pelvis. Cholelithiasis. Left colonic diverticulosis. Electronically Signed   By: Charlett Nose M.D.   On: 07/06/2022 19:10   CT HEAD WO CONTRAST ( )  Result Date: 07/06/2022 CLINICAL DATA:  Headache. EXAM: CT HEAD WITHOUT CONTRAST TECHNIQUE: Contiguous axial images were obtained from the base of the skull through the vertex without intravenous contrast. RADIATION DOSE REDUCTION: This exam was performed according to the departmental dose-optimization program which includes automated exposure control, adjustment of the mA and/or kV according to patient size and/or use of iterative reconstruction technique. COMPARISON:  Head CT dated 08/25/2021. FINDINGS: Brain: The ventricles and sulci are appropriate size for the patient's age. The gray-white matter discrimination is preserved. There is no acute intracranial hemorrhage. No mass effect or midline shift. No extra-axial fluid collection. Vascular: No hyperdense vessel or unexpected calcification. Skull: Normal. Negative for fracture or focal lesion. Sinuses/Orbits: No acute finding. Other: None IMPRESSION: No acute intracranial pathology. Electronically Signed   By: Elgie Collard M.D.   On: 07/06/2022 19:06   DG Chest Port 1 View  Result Date: 07/06/2022 CLINICAL DATA:  Chest pain, short of breath EXAM: PORTABLE CHEST 1 VIEW COMPARISON:  08/25/2021 FINDINGS: Single frontal  view of the chest demonstrates stable enlargement of the cardiac silhouette. No airspace disease, effusion, or pneumothorax. Severe degenerative changes of the bilateral shoulders again noted. No acute fracture. IMPRESSION: 1. Stable enlarged cardiac silhouette. 2. No acute airspace  disease. Electronically Signed   By: Sharlet Salina M.D.   On: 07/06/2022 15:02    Microbiology: Results for orders placed or performed during the hospital encounter of 01/05/21  Resp Panel by RT-PCR (Flu A&B, Covid) Nasopharyngeal Swab     Status: None   Collection Time: 01/05/21  8:17 AM   Specimen: Nasopharyngeal Swab; Nasopharyngeal(NP) swabs in vial transport medium  Result Value Ref Range Status   SARS Coronavirus 2 by RT PCR NEGATIVE NEGATIVE Final    Comment: (NOTE) SARS-CoV-2 target nucleic acids are NOT DETECTED.  The SARS-CoV-2 RNA is generally detectable in upper respiratory specimens during the acute phase of infection. The lowest concentration of SARS-CoV-2 viral copies this assay can detect is 138 copies/mL. A negative result does not preclude SARS-Cov-2 infection and should not be used as the sole basis for treatment or other patient management decisions. A negative result may occur with  improper specimen collection/handling, submission of specimen other than nasopharyngeal swab, presence of viral mutation(s) within the areas targeted by this assay, and inadequate number of viral copies(<138 copies/mL). A negative result must be combined with clinical observations, patient history, and epidemiological information. The expected result is Negative.  Fact Sheet for Patients:  BloggerCourse.com  Fact Sheet for Healthcare Providers:  SeriousBroker.it  This test is no t yet approved or cleared by the [place] FDA and  has been authorized for detection and/or diagnosis of SARS-CoV-2 by FDA under an Emergency Use Authorization (EUA). This EUA will remain  in effect (meaning this test can be used) for the duration of the COVID-19 declaration under Section 564(b)(1) of the Act, 21 U.S.C.section 360bbb-3(b)(1), unless the authorization is terminated  or revoked sooner.       Influenza A by PCR NEGATIVE NEGATIVE Final    Influenza B by PCR NEGATIVE NEGATIVE Final    Comment: (NOTE) The Xpert Xpress SARS-CoV-2/FLU/RSV plus assay is intended as an aid in the diagnosis of influenza from Nasopharyngeal swab specimens and should not be used as a sole basis for treatment. Nasal washings and aspirates are unacceptable for Xpert Xpress SARS-CoV-2/FLU/RSV testing.  Fact Sheet for Patients: BloggerCourse.com  Fact Sheet for Healthcare Providers: SeriousBroker.it  This test is not yet approved or cleared by the [place] FDA and has been authorized for detection and/or diagnosis of SARS-CoV-2 by FDA under an Emergency Use Authorization (EUA). This EUA will remain in effect (meaning this test can be used) for the duration of the COVID-19 declaration under Section 564(b)(1) of the Act, 21 U.S.C. section 360bbb-3(b)(1), unless the authorization is terminated or revoked.  Performed at [facility], [address], [place], [place] [address]   Urine Culture     Status: Abnormal   Collection Time: 01/05/21  4:34 PM   Specimen: Urine, Clean Catch  Result Value Ref Range Status   Specimen Description URINE, CLEAN CATCH  Final   Special Requests   Final    NONE Performed at [facility], [address], [place], [place] [address]    Culture >=100,000 COLONIES/mL ESCHERICHIA COLI (A)  Final   Report Status 01/07/2021 FINAL  Final   Organism ID, Bacteria ESCHERICHIA COLI (A)  Final      Susceptibility   Escherichia coli - MIC*  AMPICILLIN 16 INTERMEDIATE Intermediate     CEFAZOLIN <=4 SENSITIVE Sensitive     CEFEPIME <=0.12 SENSITIVE Sensitive     CEFTRIAXONE <=0.25 SENSITIVE Sensitive     CIPROFLOXACIN <=0.25 SENSITIVE Sensitive     GENTAMICIN >=16 RESISTANT Resistant     IMIPENEM <=0.25 SENSITIVE Sensitive     NITROFURANTOIN <=16 SENSITIVE Sensitive     TRIMETH/SULFA >=320 RESISTANT Resistant     AMPICILLIN/SULBACTAM 4 SENSITIVE  Sensitive     PIP/TAZO <=4 SENSITIVE Sensitive     * >=100,000 COLONIES/mL ESCHERICHIA COLI    Labs: CBC: Recent Labs  Lab 07/06/22 1425 07/07/22 0144  WBC 6.6 7.0  HGB 12.3 11.8*  HCT 40.1 36.1  MCV 92.0 86.8  PLT 220 204   Basic Metabolic Panel: Recent Labs  Lab 07/06/22 1425 07/07/22 0144 07/09/22 0218 07/10/22 1101  NA 137 136 137 137  K 3.9 3.4* 4.3 4.1  CL 101 100 106 107  CO2 26 24 24 24   GLUCOSE 85 81 94 91  BUN 10 9 20 14   CREATININE 1.07* 1.03* 1.28* 1.15*  CALCIUM 9.1 9.0 8.0* 8.2*  MG  --  1.7 2.1  --   PHOS  --  3.4  --   --    Liver Function Tests: Recent Labs  Lab 07/06/22 1425  AST 17  ALT 12  ALKPHOS 92  BILITOT 0.5  PROT 7.3  ALBUMIN 3.5    Discharge time spent: 35 minutes.  Signed: Jacquelin Hawking, MD [facility] 07/11/2022

## 2022-07-11 NOTE — TOC Progression Note (Signed)
Transition of Care [facility] - Progression Note    Patient Details  Name: Sandra Brennan MRN: [id number] Date of Birth: May 24, 1945  [facility] CM/SW Contact  Delilah Shan, LCSWA Phone Number: 07/11/2022, 1:27 PM  Clinical Narrative:     Patients insurance authorization has been approved. Plan Auth ID# [id number] .Reference # [id number]. Insurance authorization has been approved from 5/10-5/12. Kia with [facility] confirmed facility can accept patient today if medically ready. CSW informed MD. CSW will continue to follow and assist with patients dc planning needs.  Expected Discharge Plan: Skilled Nursing Facility Barriers to Discharge: SNF Pending bed offer, Insurance Authorization  Expected Discharge Plan and Services In-house Referral: Clinical Social Work     Living arrangements for the past 2 months: Apartment                                       Social Determinants of Health (SDOH) Interventions SDOH Screenings   Food Insecurity: No Food Insecurity (07/06/2022)  Housing: Low Risk  (07/06/2022)  Transportation Needs: No Transportation Needs (07/06/2022)  Utilities: Not At Risk (07/06/2022)  Alcohol Screen: Low Risk  (11/26/2021)  Depression (PHQ2-9): High Risk (06/04/2022)  Financial Resource Strain: Low Risk  (11/26/2021)  Physical Activity: Insufficiently Active (11/26/2021)  Social Connections: Moderately Isolated (11/26/2021)  Stress: No Stress Concern Present (11/26/2021)  Tobacco Use: Medium Risk (07/06/2022)    Readmission Risk Interventions     No data to display

## 2022-07-11 NOTE — Transitions of Care (Post Inpatient/ED Visit) (Signed)
   07/11/2022  Name: Sandra Brennan MRN: [id number] DOB: 1945/05/28  Today's TOC FU Call Status: Today's TOC FU Call Status:: Unsuccessul Call (1st Attempt) Unsuccessful Call (1st Attempt) Date: 07/11/22  Attempted to reach the patient regarding the most recent Inpatient/ED visit.  [name] wants her to have a hospital follow-up visit.     Signature Olga Coaster, CMA

## 2022-07-11 NOTE — Progress Notes (Signed)
PT AVS reviewed with facility RN and Verbalized understanding of all dc teaching and instructions. Pt will be leaving via [facility]. Pt has all her belongings in her possession.

## 2022-07-11 NOTE — TOC Transition Note (Addendum)
Transition of Care [facility]) - CM/SW Discharge Note   Patient Details  Name: Sandra Brennan MRN: [id number] Date of Birth: Jul 08, 1945  [facility]) CM/SW Contact:  Delilah Shan, LCSWA Phone Number: 07/11/2022, 2:09 PM   Clinical Narrative:     Patient will DC to: [facility]   Anticipated DC date: 07/11/2022  Family notified: Manufacturing systems engineer by: Sharin Mons  ?  Per MD patient ready for DC to [facility] . RN, patient, patient's family, and facility notified of DC. Patients daughter Carolan Clines confimed she can bring cpap from home to the facility. CSW informed Kia.Discharge Summary sent to facility. RN given number for report tele# [phone] RM# [phone]. DC packet on chart. Ambulance transport requested for patient.  CSW signing off.   Final next level of care: Skilled Nursing Facility Barriers to Discharge: No Barriers Identified   Patient Goals and CMS Choice CMS Medicare.gov Compare Post Acute Care list provided to:: Patient Choice offered to / list presented to : Patient  Discharge Placement                Patient chooses bed at: [facility] Patient to be transferred to facility by: [facility] Name of family member notified: Pearl Patient and family notified of of transfer: 07/11/22  Discharge Plan and Services Additional resources added to the After Visit Summary for   In-house Referral: Clinical Social Work                                   Social Determinants of Health (SDOH) Interventions SDOH Screenings   Food Insecurity: No Food Insecurity (07/06/2022)  Housing: Low Risk  (07/06/2022)  Transportation Needs: No Transportation Needs (07/06/2022)  Utilities: Not At Risk (07/06/2022)  Alcohol Screen: Low Risk  (11/26/2021)  Depression (PHQ2-9): High Risk (06/04/2022)  Financial Resource Strain: Low Risk  (11/26/2021)  Physical Activity: Insufficiently Active (11/26/2021)  Social Connections: Moderately Isolated (11/26/2021)  Stress: No Stress Concern Present (11/26/2021)   Tobacco Use: Medium Risk (07/06/2022)     Readmission Risk Interventions     No data to display

## 2022-07-11 NOTE — Progress Notes (Signed)
Physical Therapy Treatment Patient Details Name: Sandra Brennan MRN: [id number] DOB: Mar 16, 1945 Today's Date: 07/11/2022   History of Present Illness Pt is a 77 y/o F admitted on 07/06/22 after presenting with c/o dizziness & ear pain & fullness. Pt noted to be orthostatic on admission. Pt is being treated for intermittent dizziness/presyncope, weakness, & decreased p.o. intake. PMH: acute nontraumatic kidney injury, anemia, arthritis, asthma, chest pain, chronic LBP, COPD, ankle fx, gout, HTN, migraines, obesity, sleep apnea, syncope, DM2    PT Comments    Pt with continued progress towards acute goals this date, able to progress gait with RW support and min guard assist for safety for 30' in room. Pt able to demonstrate functional transfers and bed mobility at grossly min guard level with light min A to power up from low recliner. Pt continues to be limited by significantly decreased activity tolerance below baseline, weakness, fatigue, and BLE pain. Current plan remains appropriate to address deficits and maximize functional independence and decrease caregiver burden. Pt continues to benefit from skilled PT services to progress toward functional mobility goals.    Recommendations for follow up therapy are one component of a multi-disciplinary discharge planning process, led by the attending physician.  Recommendations may be updated based on patient status, additional functional criteria and insurance authorization.  Follow Up Recommendations  Can patient physically be transported by private vehicle: No    Assistance Recommended at Discharge Frequent or constant Supervision/Assistance  Patient can return home with the following A lot of help with walking and/or transfers;A lot of help with bathing/dressing/bathroom;Assistance with cooking/housework;Direct supervision/assist for financial management;Assist for transportation;Help with stairs or ramp for entrance   Equipment Recommendations   None recommended by PT (TBD in next venue)    Recommendations for Other Services       Precautions / Restrictions Precautions Precautions: Fall Restrictions Weight Bearing Restrictions: No     Mobility  Bed Mobility Overal bed mobility: Needs Assistance Bed Mobility: Supine to Sit     Supine to sit: Min guard, HOB elevated     General bed mobility comments: increased time and effort, use of bedrails    Transfers Overall transfer level: Needs assistance Equipment used: Rolling walker (2 wheels) Transfers: Sit to/from Stand Sit to Stand: Min guard, Min assist           General transfer comment: extra time to power up and transition hands to RW but no physical assist needed from EOB, min A to pwer up from recliner    Ambulation/Gait Ambulation/Gait assistance: Min guard Gait Distance (Feet): 30 Feet Assistive device: Rolling walker (2 wheels) Gait Pattern/deviations: Step-through pattern, Wide base of support       General Gait Details: slow gait with wide BOS in room, no LOB   Stairs             Wheelchair Mobility    Modified [place] (Stroke Patients Only)       Balance Overall balance assessment: Needs assistance Sitting-balance support: Feet supported, Bilateral upper extremity supported Sitting balance-Leahy Scale: Fair     Standing balance support: Bilateral upper extremity supported, During functional activity, Reliant on assistive device for balance Standing balance-Leahy Scale: Fair Standing balance comment: able to static stand without UE support                            Cognition Arousal/Alertness: Awake/alert Behavior During Therapy: WFL for tasks assessed/performed Overall Cognitive Status: Within Functional Limits  for tasks assessed                                          Exercises      General Comments        Pertinent Vitals/Pain Pain Assessment Pain Assessment: Faces Faces Pain  Scale: Hurts little more Pain Location: BLE to touch Pain Descriptors / Indicators: Grimacing, Guarding, Discomfort Pain Intervention(s): Monitored during session, Limited activity within patient's tolerance    Home Living                          Prior Function            PT Goals (current goals can now be found in the care plan section) Acute Rehab PT Goals Patient Stated Goal: get better PT Goal Formulation: With patient Time For Goal Achievement: 07/23/22 Progress towards PT goals: Progressing toward goals    Frequency    Min 3X/week      PT Plan Current plan remains appropriate    Co-evaluation              AM-PAC PT "6 Clicks" Mobility   Outcome Measure  Help needed turning from your back to your side while in a flat bed without using bedrails?: A Lot Help needed moving from lying on your back to sitting on the side of a flat bed without using bedrails?: A Lot Help needed moving to and from a bed to a chair (including a wheelchair)?: A Little Help needed standing up from a chair using your arms (e.g., wheelchair or bedside chair)?: A Little Help needed to walk in hospital room?: A Little Help needed climbing 3-5 steps with a railing? : Total 6 Click Score: 14    End of Session   Activity Tolerance: Patient tolerated treatment well Patient left: in chair;with call bell/phone within reach Nurse Communication: Mobility status PT Visit Diagnosis: Muscle weakness (generalized) (M62.81);Difficulty in walking, not elsewhere classified (R26.2);Other abnormalities of gait and mobility (R26.89);Unsteadiness on feet (R26.81)     Time: 1610-9604 PT Time Calculation (min) (ACUTE ONLY): 21 min  Charges:  $Therapeutic Activity: 8-22 mins                     Fatima Fedie R. PTA Acute Rehabilitation Services Office: [phone]   Catalina Antigua 07/11/2022, 12:48 PM

## 2022-07-14 DIAGNOSIS — E1142 Type 2 diabetes mellitus with diabetic polyneuropathy: Secondary | ICD-10-CM | POA: Diagnosis not present

## 2022-07-14 DIAGNOSIS — L304 Erythema intertrigo: Secondary | ICD-10-CM | POA: Diagnosis not present

## 2022-07-14 DIAGNOSIS — G4733 Obstructive sleep apnea (adult) (pediatric): Secondary | ICD-10-CM | POA: Diagnosis not present

## 2022-07-14 NOTE — Transitions of Care (Post Inpatient/ED Visit) (Signed)
07/14/2022  Name: Sandra Brennan MRN: [id number] DOB: 1945/11/13  Today's TOC FU Call Status: Today's TOC FU Call Status:: Successful TOC FU Call Competed Unsuccessful Call (1st Attempt) Date: 07/11/22 [facility] FU Call Complete Date: 07/14/22  Transition Care Management Follow-up Telephone Call Date of Discharge: 07/11/22 (Pt was admitted to [facility] on Friday 5/10 aroubd 2300 after DC from [facility].) Discharge Facility: [facility]) Type of Discharge: Inpatient Admission Primary Inpatient Discharge Diagnosis:: Near syncope Reason for ED Visit: Other: (Near syncope) How have you been since you were released from the hospital?: Better Any questions or concerns?: Yes Patient Questions/Concerns:: Daughter, Pear, would like an explanation of what is happening with pt's kidneys/kidney functiona and if there is anything they can do to improve or maintain function.  Items Reviewed: Medications obtained,verified, and reconciled?: Yes (Medications Reviewed) Any new allergies since your discharge?: No Dietary orders reviewed?: NA Do you have support at home?: Yes  Medications Reviewed Today: Medications Reviewed Today     Reviewed by Salvatore Marvel, CPhT (Pharmacy Technician) on 07/06/22 at 2144  Med List Status: Complete   Medication Order Taking? Sig Documenting Provider Last Dose Status Informant  acetaminophen (TYLENOL 8 HOUR ARTHRITIS PAIN) 650 MG CR tablet [id number] Yes Take 1,300 mg by mouth every 8 (eight) hours as needed (for headaches). [provider] Past Week Active Self  albuterol (VENTOLIN HFA) 108 (90 Base) MCG/ACT inhaler [id number] Yes INHALE 1 PUFF INTO THE LUNGS EVERY 6 (SIX) HOURS AS NEEDED FOR SHORTNESS OF BREATH OR WHEEZING. Anne Ng, NP unk Active Self  allopurinol (ZYLOPRIM) 100 MG tablet [id number] Yes TAKE 1 TABLET BY MOUTH EVERY DAY Nche, Bonna Gains, NP 07/04/2022 Active Self  amLODipine (NORVASC) 5 MG tablet [id number] Yes Take 1 tablet (5 mg  total) by mouth every evening.  Patient taking differently: Take 5 mg by mouth at bedtime.   Anne Ng, NP 07/05/2022 pm Active Self  aspirin EC 81 MG tablet [id number] Yes Take 81 mg by mouth daily. [provider] 07/05/2022 0800 Active Self  atorvastatin (LIPITOR) 10 MG tablet [id number] Yes TAKE 1 TABLET BY MOUTH  DAILY AT 6 PM.  Patient taking differently: Take 10 mg by mouth daily at 6 PM.   Nche, Bonna Gains, NP 07/05/2022 Active Self  cetirizine (ZYRTEC) 10 MG tablet [id number] Yes Take 10 mg by mouth in the morning. [provider] 07/05/2022 am Active Self  cholecalciferol (VITAMIN D3) 25 MCG (1000 UNIT) tablet [id number] Yes Take 1,000 Units by mouth daily. [provider] Past Week Active Self  escitalopram (LEXAPRO) 10 MG tablet [id number] No TAKE 1 TABLET BY MOUTH EVERY DAY  Patient not taking: Reported on 07/06/2022   Anne Ng, NP Not Taking Active Self  furosemide (LASIX) 20 MG tablet [id number] Yes TAKE 1/2 TABLET BY MOUTH DAILY Nche, Bonna Gains, NP 07/05/2022 am Active Self  lisinopril (ZESTRIL) 40 MG tablet [id number] Yes TAKE 1 TABLET BY MOUTH EVERY DAY  Patient taking differently: Take 40 mg by mouth in the morning.   Anne Ng, NP 07/06/2022 am Active Self  Multiple Vitamin (MULTIVITAMIN) tablet [id number] Yes Take 1 tablet by mouth daily with breakfast. [provider] Past Week Active Self  omeprazole (PRILOSEC) 20 MG capsule [id number] Yes TAKE 1 CAPSULE BY MOUTH EVERY DAY  Patient taking differently: Take 20 mg by mouth daily before breakfast.   Anne Ng, NP 07/05/2022 am Active Self  OPCON-A 0.027-0.315 % SOLN [id number] Yes Place  1 drop into both eyes 3 (three) times daily as needed (for itching). [provider] unk Active Self  pregabalin (LYRICA) 150 MG capsule [id number] Yes TAKE 1 CAPSULE BY MOUTH TWICE A DAY  Patient taking differently: Take 150 mg by mouth in the morning and at bedtime.   Jones Bales, NP Past Week Active Self  Rimegepant Sulfate (NURTEC) 75 MG TBDP [id number] Yes Take 1 tablet (75 mg total) by mouth as needed (take 1 at onset of headache, max is 75 in 24 hours).  Patient taking differently: Take 75 mg by mouth as needed (at the onset of a headache- max daily dose is 75 mg/24 hours).   Butch Penny, NP Past Week Active Self  Semaglutide,0.25 or 0.5MG /DOS, (OZEMPIC, 0.25 OR 0.5 MG/DOSE,) 2 MG/3ML SOPN [id number] Yes Inject 0.5 mg into the skin once a week.  Patient taking differently: Inject 0.5 mg into the skin every Tuesday.   Anne Ng, NP 07/01/2022 Active Self  SYMBICORT 160-4.5 MCG/ACT inhaler [id number] Yes INHALE 2 PUFFS INTO THE LUNGS IN THE MORNING AND AT BEDTIME. RINSE MOUTH AFTER EACH USE  Patient taking differently: Inhale 1 puff into the lungs in the morning and at bedtime. Rinse mouth after each use   Nche, Bonna Gains, NP 07/05/2022 Active Self  traMADol (ULTRAM) 50 MG tablet [id number] Yes Take 1 tablet (50 mg total) by mouth 3 (three) times daily. Jones Bales, NP 07/05/2022 Active Self  UNKNOWN TO PATIENT [id number] Yes Take 1 tablet by mouth See admin instructions. Unnamed vegetable-based OTC stool softener- Take 1 tablet by mouth once a day [provider] 07/05/2022 Active Self            Home Care and Equipment/Supplies: Were Home Health Services Ordered?: NA Any new equipment or medical supplies ordered?: NA  Functional Questionnaire: Do you need assistance with bathing/showering or dressing?: No Do you need assistance with meal preparation?: No Do you need assistance with eating?: No Do you have difficulty maintaining continence: No Do you need assistance with getting out of bed/getting out of a chair/moving?: No Do you have difficulty managing or taking your medications?: No  Follow up appointments reviewed: PCP Follow-up appointment confirmed?: No (pt was admitted to [facility] on Fri 5/10  around 2300.) MD Provider Line [phone] Given: No Specialist Hospital Follow-up appointment confirmed?: No Do you need transportation to your follow-up appointment?: No Do you understand care options if your condition(s) worsen?: Yes-patient verbalized understanding    SIGNATURE Arvil Persons, BSN, RN

## 2022-07-15 DIAGNOSIS — G894 Chronic pain syndrome: Secondary | ICD-10-CM | POA: Diagnosis not present

## 2022-07-15 DIAGNOSIS — M1A00X0 Idiopathic chronic gout, unspecified site, without tophus (tophi): Secondary | ICD-10-CM | POA: Diagnosis not present

## 2022-07-15 DIAGNOSIS — E1122 Type 2 diabetes mellitus with diabetic chronic kidney disease: Secondary | ICD-10-CM | POA: Diagnosis not present

## 2022-07-15 DIAGNOSIS — I5032 Chronic diastolic (congestive) heart failure: Secondary | ICD-10-CM | POA: Diagnosis not present

## 2022-07-15 DIAGNOSIS — J302 Other seasonal allergic rhinitis: Secondary | ICD-10-CM | POA: Diagnosis not present

## 2022-07-15 DIAGNOSIS — E785 Hyperlipidemia, unspecified: Secondary | ICD-10-CM | POA: Diagnosis not present

## 2022-07-15 DIAGNOSIS — K219 Gastro-esophageal reflux disease without esophagitis: Secondary | ICD-10-CM | POA: Diagnosis not present

## 2022-07-15 DIAGNOSIS — E559 Vitamin D deficiency, unspecified: Secondary | ICD-10-CM | POA: Diagnosis not present

## 2022-07-15 DIAGNOSIS — I10 Essential (primary) hypertension: Secondary | ICD-10-CM | POA: Diagnosis not present

## 2022-07-16 ENCOUNTER — Other Ambulatory Visit: Payer: Self-pay | Admitting: *Deleted

## 2022-07-16 DIAGNOSIS — M25519 Pain in unspecified shoulder: Secondary | ICD-10-CM | POA: Diagnosis not present

## 2022-07-16 DIAGNOSIS — M797 Fibromyalgia: Secondary | ICD-10-CM | POA: Diagnosis not present

## 2022-07-16 DIAGNOSIS — R5381 Other malaise: Secondary | ICD-10-CM | POA: Diagnosis not present

## 2022-07-16 DIAGNOSIS — R262 Difficulty in walking, not elsewhere classified: Secondary | ICD-10-CM | POA: Diagnosis not present

## 2022-07-16 NOTE — Patient Outreach (Signed)
Per [facility] Mrs. Sandra Brennan resides in [facility] skilled nursing facility. Mrs. Sandra Brennan was active with [facility] care coordination team prior to admission.  Spoke with [facility] discharge planner. Discussed Clinical research associate is following for transition plans. Sandra Brennan reports psychiatry will follow up with Mrs. Sandra Brennan tomorrow as recommended on hospital discharge summary.  Telephone call made to Mrs. Sandra Brennan. No answer. HIPAA compliant voicemail message left to request return call.   Will keep [facility] RN Care Coordinator updated.   Sandra Noble, MSN, RN,BSN [facility] Post Acute Care Coordinator [phone] (Direct dial)

## 2022-07-17 ENCOUNTER — Telehealth: Payer: Self-pay | Admitting: *Deleted

## 2022-07-17 DIAGNOSIS — D649 Anemia, unspecified: Secondary | ICD-10-CM | POA: Diagnosis not present

## 2022-07-17 DIAGNOSIS — K5900 Constipation, unspecified: Secondary | ICD-10-CM | POA: Diagnosis not present

## 2022-07-17 DIAGNOSIS — N1831 Chronic kidney disease, stage 3a: Secondary | ICD-10-CM | POA: Diagnosis not present

## 2022-07-17 NOTE — Progress Notes (Signed)
  Care Coordination Note  07/17/2022 Name: Sandra Brennan MRN: [id number] DOB: [birth year]  Sandra Brennan is a 78 y.o. year old female who is a primary care patient of Nche, Bonna Gains, NP and is actively engaged with the care management team.  Sandra Brennan reached out by phone today to cancel call with [facility] due to being in rehab facility.    [facility]  Care Coordination Care Guide  Direct Dial: [phone]

## 2022-07-18 DIAGNOSIS — K219 Gastro-esophageal reflux disease without esophagitis: Secondary | ICD-10-CM | POA: Diagnosis not present

## 2022-07-18 DIAGNOSIS — E559 Vitamin D deficiency, unspecified: Secondary | ICD-10-CM | POA: Diagnosis not present

## 2022-07-18 DIAGNOSIS — I5032 Chronic diastolic (congestive) heart failure: Secondary | ICD-10-CM | POA: Diagnosis not present

## 2022-07-18 DIAGNOSIS — E1022 Type 1 diabetes mellitus with diabetic chronic kidney disease: Secondary | ICD-10-CM | POA: Diagnosis not present

## 2022-07-18 DIAGNOSIS — E785 Hyperlipidemia, unspecified: Secondary | ICD-10-CM | POA: Diagnosis not present

## 2022-07-18 DIAGNOSIS — N1831 Chronic kidney disease, stage 3a: Secondary | ICD-10-CM | POA: Diagnosis not present

## 2022-07-18 DIAGNOSIS — E1122 Type 2 diabetes mellitus with diabetic chronic kidney disease: Secondary | ICD-10-CM | POA: Diagnosis not present

## 2022-07-18 DIAGNOSIS — J302 Other seasonal allergic rhinitis: Secondary | ICD-10-CM | POA: Diagnosis not present

## 2022-07-18 DIAGNOSIS — M1A00X0 Idiopathic chronic gout, unspecified site, without tophus (tophi): Secondary | ICD-10-CM | POA: Diagnosis not present

## 2022-07-18 DIAGNOSIS — G894 Chronic pain syndrome: Secondary | ICD-10-CM | POA: Diagnosis not present

## 2022-07-18 DIAGNOSIS — N183 Chronic kidney disease, stage 3 unspecified: Secondary | ICD-10-CM | POA: Diagnosis not present

## 2022-07-18 DIAGNOSIS — I10 Essential (primary) hypertension: Secondary | ICD-10-CM | POA: Diagnosis not present

## 2022-07-20 DIAGNOSIS — R531 Weakness: Secondary | ICD-10-CM | POA: Diagnosis not present

## 2022-07-20 DIAGNOSIS — M25511 Pain in right shoulder: Secondary | ICD-10-CM | POA: Diagnosis not present

## 2022-07-20 DIAGNOSIS — M797 Fibromyalgia: Secondary | ICD-10-CM | POA: Diagnosis not present

## 2022-07-20 DIAGNOSIS — G894 Chronic pain syndrome: Secondary | ICD-10-CM | POA: Diagnosis not present

## 2022-07-21 DIAGNOSIS — L304 Erythema intertrigo: Secondary | ICD-10-CM | POA: Diagnosis not present

## 2022-07-21 DIAGNOSIS — G4733 Obstructive sleep apnea (adult) (pediatric): Secondary | ICD-10-CM | POA: Diagnosis not present

## 2022-07-21 DIAGNOSIS — E1142 Type 2 diabetes mellitus with diabetic polyneuropathy: Secondary | ICD-10-CM | POA: Diagnosis not present

## 2022-07-22 DIAGNOSIS — E1122 Type 2 diabetes mellitus with diabetic chronic kidney disease: Secondary | ICD-10-CM | POA: Diagnosis not present

## 2022-07-22 DIAGNOSIS — M25511 Pain in right shoulder: Secondary | ICD-10-CM | POA: Diagnosis not present

## 2022-07-24 DIAGNOSIS — I5032 Chronic diastolic (congestive) heart failure: Secondary | ICD-10-CM | POA: Diagnosis not present

## 2022-07-24 DIAGNOSIS — R531 Weakness: Secondary | ICD-10-CM | POA: Diagnosis not present

## 2022-07-24 DIAGNOSIS — E1122 Type 2 diabetes mellitus with diabetic chronic kidney disease: Secondary | ICD-10-CM | POA: Diagnosis not present

## 2022-07-24 DIAGNOSIS — G894 Chronic pain syndrome: Secondary | ICD-10-CM | POA: Diagnosis not present

## 2022-07-29 ENCOUNTER — Encounter: Payer: Self-pay | Admitting: Adult Health

## 2022-07-29 ENCOUNTER — Ambulatory Visit (INDEPENDENT_AMBULATORY_CARE_PROVIDER_SITE_OTHER): Payer: 59 | Admitting: Adult Health

## 2022-07-29 ENCOUNTER — Other Ambulatory Visit: Payer: Self-pay | Admitting: *Deleted

## 2022-07-29 VITALS — BP 139/75 | HR 72 | Ht 59.0 in | Wt 270.0 lb

## 2022-07-29 DIAGNOSIS — G4733 Obstructive sleep apnea (adult) (pediatric): Secondary | ICD-10-CM | POA: Diagnosis not present

## 2022-07-29 NOTE — Patient Instructions (Signed)

## 2022-07-29 NOTE — Progress Notes (Signed)
PATIENT: Sandra Brennan DOB: October 13, 1945  REASON FOR VISIT: follow up HISTORY FROM: patient PRIMARY NEUROLOGIST: Dr. Frances Furbish  Chief Complaint  Patient presents with   Follow-up    Pt in 18 Pt here for CPAP f/u Pt states was admitted to hospital due to hypertension Pt states went to rehab and was d/c this past weekend      HISTORY OF PRESENT ILLNESS: Today 07/29/22:  Sandra Brennan is a 77 y.o. female with a history of OSA on CPAP. Returns today for follow-up.  Reports that CPAP  is working well.  She does state that she is now staying some with her daughter.  Reports that she has been depressed.  States that her son was killed a month ago.  Was recently in the hospital due to hypertension.  Her download is below       06/24/21: Sandra Brennan is a 77 y.o. female with a history of OSA on CPAP. Returns today for follow-up.  She reports that the CPAP is working well for her.  She has noticed the benefit when she is using it.  Her download is below  She states at least 3 times a month she will get a sharp stabbing pain in the head.  It typically resolves fairly quickly but in the moment it is painful.  She states she on occasion will take Nurtec to prevent the symptoms from coming back.  In the past we checked a sedimentation rate that was unremarkable.       REVIEW OF SYSTEMS: Out of a complete 14 system review of symptoms, the patient complains only of the following symptoms, and all other reviewed systems are negative.   ESS 9  ALLERGIES: Allergies  Allergen Reactions   Cymbalta [Duloxetine Hcl] Anaphylaxis   Pineapple Shortness Of Breath   Sulfa Antibiotics Hives, Shortness Of Breath and Itching   Influenza Vaccines Hives and Itching   Other Swelling and Other (See Comments)    Pork barbeque- Made the face swell   Penicillins Hives   Theophyllines Hives    HOME MEDICATIONS: Outpatient Medications Prior to Visit  Medication Sig Dispense Refill   acetaminophen  (TYLENOL 8 HOUR ARTHRITIS PAIN) 650 MG CR tablet Take 1,300 mg by mouth every 8 (eight) hours as needed (for headaches).     albuterol (VENTOLIN HFA) 108 (90 Base) MCG/ACT inhaler INHALE 1 PUFF INTO THE LUNGS EVERY 6 (SIX) HOURS AS NEEDED FOR SHORTNESS OF BREATH OR WHEEZING. 8.5 each 3   allopurinol (ZYLOPRIM) 100 MG tablet TAKE 1 TABLET BY MOUTH EVERY DAY 90 tablet 3   amLODipine (NORVASC) 5 MG tablet Take 1 tablet (5 mg total) by mouth every evening. (Patient taking differently: Take 5 mg by mouth at bedtime.) 30 tablet 5   aspirin EC 81 MG tablet Take 81 mg by mouth daily.     atorvastatin (LIPITOR) 10 MG tablet TAKE 1 TABLET BY MOUTH  DAILY AT 6 PM. (Patient taking differently: Take 10 mg by mouth daily at 6 PM.) 90 tablet 3   cetirizine (ZYRTEC) 10 MG tablet Take 10 mg by mouth in the morning.     cholecalciferol (VITAMIN D3) 25 MCG (1000 UNIT) tablet Take 1,000 Units by mouth daily.     escitalopram (LEXAPRO) 10 MG tablet TAKE 1 TABLET BY MOUTH EVERY DAY 90 tablet 2   furosemide (LASIX) 20 MG tablet TAKE 1/2 TABLET BY MOUTH DAILY 45 tablet 1   lisinopril (ZESTRIL) 40 MG tablet TAKE 1  TABLET BY MOUTH EVERY DAY (Patient taking differently: Take 40 mg by mouth in the morning.) 90 tablet 1   Multiple Vitamin (MULTIVITAMIN) tablet Take 1 tablet by mouth daily with breakfast.     omeprazole (PRILOSEC) 20 MG capsule TAKE 1 CAPSULE BY MOUTH EVERY DAY (Patient taking differently: Take 20 mg by mouth daily before breakfast.) 90 capsule 1   OPCON-A 0.027-0.315 % SOLN Place 1 drop into both eyes 3 (three) times daily as needed (for itching).     pregabalin (LYRICA) 150 MG capsule Take 1 capsule (150 mg total) by mouth 2 (two) times daily. 10 capsule 0   Rimegepant Sulfate (NURTEC) 75 MG TBDP Take 1 tablet (75 mg total) by mouth as needed (take 1 at onset of headache, max is 75 in 24 hours). (Patient taking differently: Take 75 mg by mouth as needed (at the onset of a headache- max daily dose is 75 mg/24  hours).) 10 tablet 11   Semaglutide,0.25 or 0.5MG /DOS, (OZEMPIC, 0.25 OR 0.5 MG/DOSE,) 2 MG/3ML SOPN Inject 0.5 mg into the skin once a week. (Patient taking differently: Inject 0.5 mg into the skin every Tuesday.) 9 mL 0   SYMBICORT 160-4.5 MCG/ACT inhaler INHALE 2 PUFFS INTO THE LUNGS IN THE MORNING AND AT BEDTIME. RINSE MOUTH AFTER EACH USE (Patient taking differently: Inhale 1 puff into the lungs in the morning and at bedtime. Rinse mouth after each use) 30.6 each 6   traMADol (ULTRAM) 50 MG tablet Take 1 tablet (50 mg total) by mouth 3 (three) times daily. 10 tablet 0   No facility-administered medications prior to visit.    PAST MEDICAL HISTORY: Past Medical History:  Diagnosis Date   Acute nontraumatic kidney injury (HCC) 05/05/2013   Acute posthemorrhagic anemia 09/06/2012   Anemia    Arthritis    "plenty" (08-20-12)   Asthma    Chest pain 03/30/2016   Chronic lower back pain    "they say I need a whole new spinal column" (08/20/12)   COPD (chronic obstructive pulmonary disease) (HCC)    Degenerative arthritis    "all over" (08/20/2012)   Fx ankle    GERD (gastroesophageal reflux disease)    Gout 05/05/2013   Hypertension    Migraines    "used to; totally stopped when I quit drinking 30 yr ago" (20-Aug-2012)   Myalgia and myositis    Myocardial infarction [facility]) 1992   20-Aug-2012 "mild MI when son died "   Obesity    Osteoporosis    "all over" (08/20/2012)   Shortness of breath    when stressed.   Sleep apnea    "never did fix my machine; haven't used one for 5 years; I've lost 116# since then; no problems now" (20-Aug-2012)   Syncope 03/30/2016   Type II diabetes mellitus (HCC)    "borderline; don't test; take Metformin" (2012-08-20)    PAST SURGICAL HISTORY: Past Surgical History:  Procedure Laterality Date   ABDOMINAL HYSTERECTOMY  1990's   GANGLION CYST EXCISION Right 1980's   "wrist" (08/20/2012)   JOINT REPLACEMENT     KNEE LIGAMENT RECONSTRUCTION Right 1980's    TONSILLECTOMY  ~ 1954   TOTAL HIP ARTHROPLASTY Left 2012/08/20   TOTAL HIP ARTHROPLASTY Left 08/20/2012   Procedure: TOTAL HIP ARTHROPLASTY;  Surgeon: [name], MD;  Location: [facility] OR;  Service: Orthopedics;  Laterality: Left;  Left Total Hip Arthroplasty   TOTAL HIP ARTHROPLASTY Left 08/28/2012   Procedure: Irrigation and Debridement hip ;  Surgeon: Vanita Panda  Magnus Ivan, MD;  Location: [facility] OR;  Service: Orthopedics;  Laterality: Left;   TOTAL KNEE ARTHROPLASTY Left 2001   TOTAL KNEE ARTHROPLASTY Right 2004    FAMILY HISTORY: Family History  Problem Relation Age of Onset   Arthritis Mother    Arthritis Father    Colon cancer Father    Diabetes Sister    Arthritis Sister    Diabetes Maternal Grandmother    Arthritis Maternal Grandmother    Diabetes Maternal Grandfather    Arthritis Maternal Grandfather    Migraines Neg Hx    Sleep apnea Neg Hx     SOCIAL HISTORY: Social History   Socioeconomic History   Marital status: Widowed    Spouse name: Not on file   Number of children: 5   Years of education: PhD   Highest education level: Not on file  Occupational History   Occupation: Retired  Tobacco Use   Smoking status: Former    Packs/day: 0.12    Years: 2.00    Additional pack years: 0.00    Total pack years: 0.24    Types: Cigarettes   Smokeless tobacco: Never   Tobacco comments:    08/03/2012 "quit 30 years ago"  Vaping Use   Vaping Use: Never used  Substance and Sexual Activity   Alcohol use: No    Alcohol/week: 0.0 standard drinks of alcohol    Comment: 08/03/2012 "used to be a beeralcoholic"; stopped ~ 30 yr ago"   Drug use: No   Sexual activity: Never  Other Topics Concern   Not on file  Social History Narrative   Lives at home with her mother and caregiver.- mother past away 01/2021 and now lives alone.   Occasional use of caffeine.   Right-handed.   Social Determinants of Health   Financial Resource Strain: Low Risk  (11/26/2021)   Overall Financial  Resource Strain (CARDIA)    Difficulty of Paying Living Expenses: Not hard at all  Food Insecurity: No Food Insecurity (07/06/2022)   Hunger Vital Sign    Worried About Running Out of Food in the Last Year: Never true    Ran Out of Food in the Last Year: Never true  Transportation Needs: No Transportation Needs (07/06/2022)   PRAPARE - Administrator, Civil Service (Medical): No    Lack of Transportation (Non-Medical): No  Physical Activity: Insufficiently Active (11/26/2021)   Exercise Vital Sign    Days of Exercise per Week: 3 days    Minutes of Exercise per Session: 30 min  Stress: No Stress Concern Present (11/26/2021)   [employer] of Occupational Health - Occupational Stress Questionnaire    Feeling of Stress : Not at all  Social Connections: Moderately Isolated (11/26/2021)   Social Connection and Isolation Panel [NHANES]    Frequency of Communication with Friends and Family: More than three times a week    Frequency of Social Gatherings with Friends and Family: More than three times a week    Attends Religious Services: More than 4 times per year    Active Member of [employer] or Organizations: No    Attends Banker Meetings: Never    Marital Status: Widowed  Intimate Partner Violence: Not At Risk (07/06/2022)   Humiliation, Afraid, Rape, and Kick questionnaire    Fear of Current or Ex-Partner: No    Emotionally Abused: No    Physically Abused: No    Sexually Abused: No      PHYSICAL EXAM  Vitals:  07/29/22 1125  BP: 139/75  Pulse: 72  Weight: 270 lb (122.5 kg)  Height: 4\' 11"  (1.499 m)   Body mass index is 54.53 kg/m.  Generalized: Well developed, in no acute distress    Neurological examination  Mentation: Alert oriented to time, place, history taking. Follows all commands speech and language fluent Cranial nerve II-XII: Facial symmetry noted  DIAGNOSTIC DATA (LABS, IMAGING, TESTING) - I reviewed patient records, labs, notes, testing  and imaging myself where available.  Lab Results  Component Value Date   WBC 7.0 07/07/2022   HGB 11.8 (L) 07/07/2022   HCT 36.1 07/07/2022   MCV 86.8 07/07/2022   PLT 204 07/07/2022      Component Value Date/Time   NA 137 07/10/2022 1101   NA 142 07/14/2014 1014   K 4.1 07/10/2022 1101   CL 107 07/10/2022 1101   CO2 24 07/10/2022 1101   GLUCOSE 91 07/10/2022 1101   BUN 14 07/10/2022 1101   BUN 12 07/14/2014 1014   CREATININE 1.15 (H) 07/10/2022 1101   CREATININE 1.39 (H) 04/09/2018 1549   CALCIUM 8.2 (L) 07/10/2022 1101   PROT 7.3 07/06/2022 1425   PROT 6.9 07/14/2014 1014   ALBUMIN 3.5 07/06/2022 1425   ALBUMIN 4.3 07/14/2014 1014   AST 17 07/06/2022 1425   ALT 12 07/06/2022 1425   ALKPHOS 92 07/06/2022 1425   BILITOT 0.5 07/06/2022 1425   BILITOT 0.2 07/14/2014 1014   GFRNONAA 50 (L) 07/10/2022 1101   GFRAA 46 (L) 03/07/2019 2115   Lab Results  Component Value Date   CHOL 135 01/01/2022   HDL 59.60 01/01/2022   LDLCALC 61 01/01/2022   TRIG 73.0 01/01/2022   CHOLHDL 2 01/01/2022   Lab Results  Component Value Date   HGBA1C 5.6 06/04/2022   Lab Results  Component Value Date   VITAMINB12 877 08/21/2020   Lab Results  Component Value Date   TSH 1.59 04/05/2021      ASSESSMENT AND PLAN 76 y.o. year old female  has a past medical history of Acute nontraumatic kidney injury (HCC) (05/05/2013), Acute posthemorrhagic anemia (09/06/2012), Anemia, Arthritis, Asthma, Chest pain (03/30/2016), Chronic lower back pain, COPD (chronic obstructive pulmonary disease) (HCC), Degenerative arthritis, Fx ankle, GERD (gastroesophageal reflux disease), Gout (05/05/2013), Hypertension, Migraines, Myalgia and myositis, Myocardial infarction (HCC) (1992), Obesity, Osteoporosis, Shortness of breath, Sleep apnea, Syncope (03/30/2016), and Type II diabetes mellitus (HCC). here with:  OSA on CPAP   - CPAP compliance excellent - Good treatment of AHI  - Encourage patient to use  CPAP nightly and > 4 hours each night - F/U in 1 year or sooner if needed    Butch Penny, MSN, NP-C 07/29/2022, 11:33 AM [facility] [address], [address] [place], [place] [address] [phone]

## 2022-07-29 NOTE — Patient Outreach (Signed)
Per [facility] Sandra Brennan discharged from [facility] skilled nursing facility on 07/25/22. Screening for potential [facility] care coordination services as benefit of health plan and  Primary Care Provider.  Will confirm with [facility] discharge planner team about home health arrangements.   Will notify [facility] care coordination team about SNF discharge.   Raiford Noble, MSN, RN,BSN [facility] Post Acute Care Coordinator [phone] (Direct dial)

## 2022-07-30 ENCOUNTER — Ambulatory Visit: Payer: Self-pay

## 2022-07-30 ENCOUNTER — Other Ambulatory Visit: Payer: Self-pay | Admitting: *Deleted

## 2022-07-30 DIAGNOSIS — I5032 Chronic diastolic (congestive) heart failure: Secondary | ICD-10-CM | POA: Diagnosis not present

## 2022-07-30 DIAGNOSIS — M6281 Muscle weakness (generalized): Secondary | ICD-10-CM | POA: Diagnosis not present

## 2022-07-30 DIAGNOSIS — R2689 Other abnormalities of gait and mobility: Secondary | ICD-10-CM | POA: Diagnosis not present

## 2022-07-30 DIAGNOSIS — M1A00X0 Idiopathic chronic gout, unspecified site, without tophus (tophi): Secondary | ICD-10-CM | POA: Diagnosis not present

## 2022-07-30 DIAGNOSIS — J449 Chronic obstructive pulmonary disease, unspecified: Secondary | ICD-10-CM | POA: Diagnosis not present

## 2022-07-30 NOTE — Patient Outreach (Signed)
[facility] Post-Acute Care Coordinator follow up.   Confirmed with [facility] discharge planner, PT/OT, nurse, aide will be provided by [facility].  Raiford Noble, MSN, RN,BSN [facility] Post Acute Care Coordinator [phone] (Direct dial)

## 2022-07-30 NOTE — Patient Instructions (Signed)
Visit Information  Thank you for taking time to visit with me today. Please don't hesitate to contact me if I can be of assistance to you.   Following are the goals we discussed today:   Goals Addressed             This Visit's Progress    I want to manage my blood pressure         Patient Goals/Self Care Activities: -Patient/Caregiver will self-administer medications as prescribed as evidenced by self-report/primary caregiver report  -Patient/Caregiver will attend all scheduled provider appointments as evidenced by clinician review of documented attendance to scheduled appointments and patient/caregiver report -Patient/Caregiver will call pharmacy for medication refills as evidenced by patient report and review of pharmacy fill history as appropriate -Patient/Caregiver will call provider office for new concerns or questions as evidenced by review of documented incoming telephone call notes and patient report -Patient/Caregiver verbalizes understanding of plan -Patient/Caregiver will focus on medication adherence by taking medications as prescribed  -Calls provider office for new concerns, questions, or BP outside discussed parameters -Checks BP and records as discussed -discuss your pain in your feet and back with PCP  BP Readings from Last 3 Encounters:  07/29/22 139/75  07/11/22 (!) 129/51  06/25/22 (!) 141/72         Our next appointment is by telephone on 08/06/22 at 11 am  Please call the care guide team at [phone] if you need to cancel or reschedule your appointment.   If you are experiencing a Mental Health or Behavioral Health Crisis or need someone to talk to, please call 1-800-273-TALK (toll free, 24 hour hotline)  The patient verbalized understanding of instructions, educational materials, and care plan provided today.    Juanell Fairly RN, BSN, [facility] Care Coordinator [facility]   Phone: [phone]

## 2022-07-30 NOTE — Patient Outreach (Signed)
  Care Coordination   Follow Up Visit Note   07/30/2022 Name: Sandra Brennan MRN: [id number] DOB: 06-Jun-1945  Sandra Brennan is a 77 y.o. year old female who sees Nche, Bonna Gains, NP for primary care. I spoke with  Sandra Brennan by phone today.  What matters to the patients health and wellness today?  Sandra Brennan is doing better after being released from rehab. She hasn't been moving around much due to pain in her feet and back. Today, she received her hospital bed and is feeling happy about it. Her blood pressure at her appointment yesterday was 139/75. She hasn't experienced any chest pain or headaches. Her follow-up appointment with Dr. Laray Anger is scheduled for 08/06/22 at 11 AM.     Goals Addressed             This Visit's Progress    I want to manage my blood pressure         Patient Goals/Self Care Activities: -Patient/Caregiver will self-administer medications as prescribed as evidenced by self-report/primary caregiver report  -Patient/Caregiver will attend all scheduled provider appointments as evidenced by clinician review of documented attendance to scheduled appointments and patient/caregiver report -Patient/Caregiver will call pharmacy for medication refills as evidenced by patient report and review of pharmacy fill history as appropriate -Patient/Caregiver will call provider office for new concerns or questions as evidenced by review of documented incoming telephone call notes and patient report -Patient/Caregiver verbalizes understanding of plan -Patient/Caregiver will focus on medication adherence by taking medications as prescribed  -Calls provider office for new concerns, questions, or BP outside discussed parameters -Checks BP and records as discussed -discuss your pain in your feet and back with PCP  BP Readings from Last 3 Encounters:  07/29/22 139/75  07/11/22 (!) 129/51  06/25/22 (!) 141/72         SDOH assessments and interventions completed:  No      Care Coordination Interventions:  Yes, provided   Interventions Today    Flowsheet Row Most Recent Value  Chronic Disease   Chronic disease during today's visit Hypertension (HTN), Other  [Pain in feet and back]  General Interventions   General Interventions Discussed/Reviewed General Interventions Discussed, General Interventions Reviewed  Exercise Interventions   Exercise Discussed/Reviewed Assistive device use and maintanence  Nutrition Interventions   Nutrition Discussed/Reviewed Nutrition Discussed  Pharmacy Interventions   Pharmacy Dicussed/Reviewed Pharmacy Topics Discussed  [Take your scrits from rehab to your appr. on 08/06/22 with PCP]  Safety Interventions   Safety Discussed/Reviewed Safety Discussed        Follow up plan: Follow up call scheduled for 08/06/22 11 am    Encounter Outcome:  Pt. Visit Completed   Juanell Fairly RN, BSN, [facility] Care Coordinator [facility]   Phone: [phone]

## 2022-07-31 DIAGNOSIS — G4733 Obstructive sleep apnea (adult) (pediatric): Secondary | ICD-10-CM | POA: Diagnosis not present

## 2022-07-31 DIAGNOSIS — G894 Chronic pain syndrome: Secondary | ICD-10-CM | POA: Diagnosis not present

## 2022-07-31 DIAGNOSIS — M797 Fibromyalgia: Secondary | ICD-10-CM | POA: Diagnosis not present

## 2022-07-31 DIAGNOSIS — J4489 Other specified chronic obstructive pulmonary disease: Secondary | ICD-10-CM | POA: Diagnosis not present

## 2022-07-31 DIAGNOSIS — E785 Hyperlipidemia, unspecified: Secondary | ICD-10-CM | POA: Diagnosis not present

## 2022-07-31 DIAGNOSIS — E1142 Type 2 diabetes mellitus with diabetic polyneuropathy: Secondary | ICD-10-CM | POA: Diagnosis not present

## 2022-07-31 DIAGNOSIS — K5730 Diverticulosis of large intestine without perforation or abscess without bleeding: Secondary | ICD-10-CM | POA: Diagnosis not present

## 2022-07-31 DIAGNOSIS — Z7951 Long term (current) use of inhaled steroids: Secondary | ICD-10-CM | POA: Diagnosis not present

## 2022-07-31 DIAGNOSIS — K8020 Calculus of gallbladder without cholecystitis without obstruction: Secondary | ICD-10-CM | POA: Diagnosis not present

## 2022-07-31 DIAGNOSIS — F32A Depression, unspecified: Secondary | ICD-10-CM | POA: Diagnosis not present

## 2022-07-31 DIAGNOSIS — I5030 Unspecified diastolic (congestive) heart failure: Secondary | ICD-10-CM | POA: Diagnosis not present

## 2022-07-31 DIAGNOSIS — E1122 Type 2 diabetes mellitus with diabetic chronic kidney disease: Secondary | ICD-10-CM | POA: Diagnosis not present

## 2022-07-31 DIAGNOSIS — M4802 Spinal stenosis, cervical region: Secondary | ICD-10-CM | POA: Diagnosis not present

## 2022-07-31 DIAGNOSIS — E559 Vitamin D deficiency, unspecified: Secondary | ICD-10-CM | POA: Diagnosis not present

## 2022-07-31 DIAGNOSIS — I130 Hypertensive heart and chronic kidney disease with heart failure and stage 1 through stage 4 chronic kidney disease, or unspecified chronic kidney disease: Secondary | ICD-10-CM | POA: Diagnosis not present

## 2022-07-31 DIAGNOSIS — K5900 Constipation, unspecified: Secondary | ICD-10-CM | POA: Diagnosis not present

## 2022-07-31 DIAGNOSIS — G43909 Migraine, unspecified, not intractable, without status migrainosus: Secondary | ICD-10-CM | POA: Diagnosis not present

## 2022-07-31 DIAGNOSIS — M1030 Gout due to renal impairment, unspecified site: Secondary | ICD-10-CM | POA: Diagnosis not present

## 2022-07-31 DIAGNOSIS — D631 Anemia in chronic kidney disease: Secondary | ICD-10-CM | POA: Diagnosis not present

## 2022-07-31 DIAGNOSIS — M19041 Primary osteoarthritis, right hand: Secondary | ICD-10-CM | POA: Diagnosis not present

## 2022-07-31 DIAGNOSIS — M19042 Primary osteoarthritis, left hand: Secondary | ICD-10-CM | POA: Diagnosis not present

## 2022-07-31 DIAGNOSIS — N1831 Chronic kidney disease, stage 3a: Secondary | ICD-10-CM | POA: Diagnosis not present

## 2022-08-02 DIAGNOSIS — G4733 Obstructive sleep apnea (adult) (pediatric): Secondary | ICD-10-CM | POA: Diagnosis not present

## 2022-08-06 ENCOUNTER — Encounter: Payer: Self-pay | Admitting: Nurse Practitioner

## 2022-08-06 ENCOUNTER — Ambulatory Visit (INDEPENDENT_AMBULATORY_CARE_PROVIDER_SITE_OTHER): Payer: 59 | Admitting: Nurse Practitioner

## 2022-08-06 VITALS — BP 120/70 | HR 92 | Temp 98.7°F | Resp 16 | Ht 59.0 in | Wt 274.4 lb

## 2022-08-06 DIAGNOSIS — M19041 Primary osteoarthritis, right hand: Secondary | ICD-10-CM

## 2022-08-06 DIAGNOSIS — D631 Anemia in chronic kidney disease: Secondary | ICD-10-CM | POA: Diagnosis not present

## 2022-08-06 DIAGNOSIS — I10 Essential (primary) hypertension: Secondary | ICD-10-CM | POA: Diagnosis not present

## 2022-08-06 DIAGNOSIS — Z9089 Acquired absence of other organs: Secondary | ICD-10-CM

## 2022-08-06 DIAGNOSIS — N1831 Chronic kidney disease, stage 3a: Secondary | ICD-10-CM | POA: Diagnosis not present

## 2022-08-06 DIAGNOSIS — E559 Vitamin D deficiency, unspecified: Secondary | ICD-10-CM

## 2022-08-06 DIAGNOSIS — G894 Chronic pain syndrome: Secondary | ICD-10-CM

## 2022-08-06 DIAGNOSIS — F32A Depression, unspecified: Secondary | ICD-10-CM

## 2022-08-06 DIAGNOSIS — E1142 Type 2 diabetes mellitus with diabetic polyneuropathy: Secondary | ICD-10-CM

## 2022-08-06 DIAGNOSIS — J4489 Other specified chronic obstructive pulmonary disease: Secondary | ICD-10-CM | POA: Diagnosis not present

## 2022-08-06 DIAGNOSIS — G4733 Obstructive sleep apnea (adult) (pediatric): Secondary | ICD-10-CM

## 2022-08-06 DIAGNOSIS — Z87891 Personal history of nicotine dependence: Secondary | ICD-10-CM

## 2022-08-06 DIAGNOSIS — M1030 Gout due to renal impairment, unspecified site: Secondary | ICD-10-CM | POA: Diagnosis not present

## 2022-08-06 DIAGNOSIS — G43909 Migraine, unspecified, not intractable, without status migrainosus: Secondary | ICD-10-CM

## 2022-08-06 DIAGNOSIS — Z7951 Long term (current) use of inhaled steroids: Secondary | ICD-10-CM

## 2022-08-06 DIAGNOSIS — Z7982 Long term (current) use of aspirin: Secondary | ICD-10-CM

## 2022-08-06 DIAGNOSIS — L304 Erythema intertrigo: Secondary | ICD-10-CM | POA: Diagnosis not present

## 2022-08-06 DIAGNOSIS — I5030 Unspecified diastolic (congestive) heart failure: Secondary | ICD-10-CM | POA: Diagnosis not present

## 2022-08-06 DIAGNOSIS — M4802 Spinal stenosis, cervical region: Secondary | ICD-10-CM | POA: Diagnosis not present

## 2022-08-06 DIAGNOSIS — K5900 Constipation, unspecified: Secondary | ICD-10-CM

## 2022-08-06 DIAGNOSIS — E785 Hyperlipidemia, unspecified: Secondary | ICD-10-CM

## 2022-08-06 DIAGNOSIS — Z96642 Presence of left artificial hip joint: Secondary | ICD-10-CM

## 2022-08-06 DIAGNOSIS — K8020 Calculus of gallbladder without cholecystitis without obstruction: Secondary | ICD-10-CM

## 2022-08-06 DIAGNOSIS — Z9989 Dependence on other enabling machines and devices: Secondary | ICD-10-CM

## 2022-08-06 DIAGNOSIS — Z90710 Acquired absence of both cervix and uterus: Secondary | ICD-10-CM

## 2022-08-06 DIAGNOSIS — I130 Hypertensive heart and chronic kidney disease with heart failure and stage 1 through stage 4 chronic kidney disease, or unspecified chronic kidney disease: Secondary | ICD-10-CM | POA: Diagnosis not present

## 2022-08-06 DIAGNOSIS — Z79891 Long term (current) use of opiate analgesic: Secondary | ICD-10-CM

## 2022-08-06 DIAGNOSIS — F419 Anxiety disorder, unspecified: Secondary | ICD-10-CM

## 2022-08-06 DIAGNOSIS — Z9181 History of falling: Secondary | ICD-10-CM

## 2022-08-06 DIAGNOSIS — E1122 Type 2 diabetes mellitus with diabetic chronic kidney disease: Secondary | ICD-10-CM | POA: Diagnosis not present

## 2022-08-06 DIAGNOSIS — Z7409 Other reduced mobility: Secondary | ICD-10-CM | POA: Diagnosis not present

## 2022-08-06 DIAGNOSIS — M797 Fibromyalgia: Secondary | ICD-10-CM | POA: Diagnosis not present

## 2022-08-06 DIAGNOSIS — Z7985 Long-term (current) use of injectable non-insulin antidiabetic drugs: Secondary | ICD-10-CM

## 2022-08-06 DIAGNOSIS — K5730 Diverticulosis of large intestine without perforation or abscess without bleeding: Secondary | ICD-10-CM

## 2022-08-06 DIAGNOSIS — M19042 Primary osteoarthritis, left hand: Secondary | ICD-10-CM | POA: Diagnosis not present

## 2022-08-06 DIAGNOSIS — R159 Full incontinence of feces: Secondary | ICD-10-CM

## 2022-08-06 DIAGNOSIS — Z96653 Presence of artificial knee joint, bilateral: Secondary | ICD-10-CM

## 2022-08-06 MED ORDER — NYSTATIN 100000 UNIT/GM EX POWD
1.0000 | Freq: Three times a day (TID) | CUTANEOUS | 0 refills | Status: DC
Start: 2022-08-06 — End: 2023-02-12

## 2022-08-06 NOTE — Assessment & Plan Note (Signed)
Reports pain is not controlled Under the care of Dr. Fritzi Mandes (pain clinic) Current use of lyrica 150mg  BID and tramadol 50mg  TID No improvement with spinal injection in past per patient. She does not wish to repeat this. She has limited mobility, so struggles with ADLs. She relies on her family to provide transportation and meals. She lives alone but her daughters and son are involved in her care. She check on her daily. She lives alone. Her home is equipped for a disabled person. She is considering moving in with her son or daughter in the near future. With recent hospitalization, she now has a hospital bed and Home PT services  Advised to continue f/up with pain clinic She agreed to [id number] to help with transportation and meals

## 2022-08-06 NOTE — Assessment & Plan Note (Signed)
BP at goal with lisinopril and amlodipine BP Readings from Last 3 Encounters:  08/06/22 120/70  07/29/22 139/75  07/11/22 (!) 129/51    Maintain med doses F/up in 3months

## 2022-08-06 NOTE — Patient Instructions (Addendum)
Maintain med dose Send Scat form to be completed. Sign medical release to get records from rehab facility

## 2022-08-06 NOTE — Assessment & Plan Note (Addendum)
Stable renal function Persistent LE edema, current use of furosemide daily Stable weight Wt Readings from Last 3 Encounters:  08/06/22 274 lb 6.4 oz (124.5 kg)  07/29/22 270 lb (122.5 kg)  07/11/22 274 lb 7.6 oz (124.5 kg)

## 2022-08-06 NOTE — Progress Notes (Signed)
Established Patient Visit  Patient: Sandra Brennan   DOB: 1945-06-24   77 y.o. Female  MRN: [id number] Visit Date: 08/06/2022  Subjective:    Chief Complaint  Patient presents with   Follow-up    She is doing ok  Needs refill of a powder they used for yeast infection   HPI Hospital stay due to generalized weakness and dizziness Hospitalization 07/06/22 to 07/11/22 Discharge from [facility] on 08/01/2022, she had Rehab sessions BID x 2weeks.  Today she reports weakness and dizziness have improved but not completed resolved. Reviewed hospital lab results and radiology report: no acute finding We reconciled medication list today.  Chronic pain syndrome Reports pain is not controlled Under the care of Dr. Fritzi Mandes (pain clinic) Current use of lyrica 150mg  BID and tramadol 50mg  TID No improvement with spinal injection in past per patient. She does not wish to repeat this. She has limited mobility, so struggles with ADLs. She relies on her family to provide transportation and meals. She lives alone but her daughters and son are involved in her care. She check on her daily. She lives alone. Her home is equipped for a disabled person. She is considering moving in with her son or daughter in the near future. With recent hospitalization, she now has a hospital bed and Home PT services  Advised to continue f/up with pain clinic She agreed to [id number] to help with transportation and meals  CKD (chronic kidney disease) stage 3, GFR 30-59 ml/min (HCC) Stable renal function Persistent LE edema, current use of furosemide daily Stable weight Wt Readings from Last 3 Encounters:  08/06/22 274 lb 6.4 oz (124.5 kg)  07/29/22 270 lb (122.5 kg)  07/11/22 274 lb 7.6 oz (124.5 kg)     Hypertension BP at goal with lisinopril and amlodipine BP Readings from Last 3 Encounters:  08/06/22 120/70  07/29/22 139/75  07/11/22 (!) 129/51    Maintain med doses F/up in  3months  BP Readings from Last 3 Encounters:  08/06/22 120/70  07/29/22 139/75  07/11/22 (!) 129/51    Wt Readings from Last 3 Encounters:  08/06/22 274 lb 6.4 oz (124.5 kg)  07/29/22 270 lb (122.5 kg)  07/11/22 274 lb 7.6 oz (124.5 kg)    Reviewed medical, surgical, and social history today  Medications: Outpatient Medications Prior to Visit  Medication Sig   acetaminophen (TYLENOL 8 HOUR ARTHRITIS PAIN) 650 MG CR tablet Take 1,300 mg by mouth every 8 (eight) hours as needed (for headaches).   albuterol (VENTOLIN HFA) 108 (90 Base) MCG/ACT inhaler INHALE 1 PUFF INTO THE LUNGS EVERY 6 (SIX) HOURS AS NEEDED FOR SHORTNESS OF BREATH OR WHEEZING.   allopurinol (ZYLOPRIM) 100 MG tablet TAKE 1 TABLET BY MOUTH EVERY DAY   amLODipine (NORVASC) 5 MG tablet Take 1 tablet (5 mg total) by mouth every evening. (Patient taking differently: Take 5 mg by mouth at bedtime.)   aspirin EC 81 MG tablet Take 81 mg by mouth daily.   atorvastatin (LIPITOR) 10 MG tablet TAKE 1 TABLET BY MOUTH  DAILY AT 6 PM. (Patient taking differently: Take 10 mg by mouth daily at 6 PM.)   cetirizine (ZYRTEC) 10 MG tablet Take 10 mg by mouth in the morning.   cholecalciferol (VITAMIN D3) 25 MCG (1000 UNIT) tablet Take 1,000 Units by mouth daily.   escitalopram (LEXAPRO) 10 MG tablet TAKE 1 TABLET BY MOUTH EVERY  DAY   furosemide (LASIX) 20 MG tablet TAKE 1/2 TABLET BY MOUTH DAILY   lisinopril (ZESTRIL) 40 MG tablet TAKE 1 TABLET BY MOUTH EVERY DAY (Patient taking differently: Take 40 mg by mouth in the morning.)   Multiple Vitamin (MULTIVITAMIN) tablet Take 1 tablet by mouth daily with breakfast.   omeprazole (PRILOSEC) 20 MG capsule TAKE 1 CAPSULE BY MOUTH EVERY DAY (Patient taking differently: Take 20 mg by mouth daily before breakfast.)   OPCON-A 0.027-0.315 % SOLN Place 1 drop into both eyes 3 (three) times daily as needed (for itching).   pregabalin (LYRICA) 150 MG capsule Take 1 capsule (150 mg total) by mouth 2 (two)  times daily.   Rimegepant Sulfate (NURTEC) 75 MG TBDP Take 1 tablet (75 mg total) by mouth as needed (take 1 at onset of headache, max is 75 in 24 hours). (Patient taking differently: Take 75 mg by mouth as needed (at the onset of a headache- max daily dose is 75 mg/24 hours).)   Semaglutide,0.25 or 0.5MG /DOS, (OZEMPIC, 0.25 OR 0.5 MG/DOSE,) 2 MG/3ML SOPN Inject 0.5 mg into the skin once a week. (Patient taking differently: Inject 0.5 mg into the skin every Tuesday.)   SYMBICORT 160-4.5 MCG/ACT inhaler INHALE 2 PUFFS INTO THE LUNGS IN THE MORNING AND AT BEDTIME. RINSE MOUTH AFTER EACH USE (Patient taking differently: Inhale 1 puff into the lungs in the morning and at bedtime. Rinse mouth after each use)   traMADol (ULTRAM) 50 MG tablet Take 1 tablet (50 mg total) by mouth 3 (three) times daily.   No facility-administered medications prior to visit.   Reviewed past medical and social history.   ROS per HPI above       Objective:  BP 120/70 (BP Location: Left Arm, Patient Position: Sitting, Cuff Size: Large)   Pulse 92   Temp 98.7 F (37.1 C) (Temporal)   Resp 16   Ht 4\' 11"  (1.499 m)   Wt 274 lb 6.4 oz (124.5 kg)   SpO2 92%   BMI 55.42 kg/m      Physical Exam Vitals and nursing note reviewed.  Cardiovascular:     Rate and Rhythm: Normal rate and regular rhythm.     Pulses: Normal pulses.     Heart sounds: Normal heart sounds.  Pulmonary:     Effort: Pulmonary effort is normal.     Breath sounds: Normal breath sounds.  Musculoskeletal:     Right lower leg: Edema present.     Left lower leg: Edema present.  Neurological:     Mental Status: She is alert and oriented to person, place, and time.     No results found for any visits on 08/06/22.    Assessment & Plan:    Problem List Items Addressed This Visit       Cardiovascular and Mediastinum   Hypertension (Chronic)    BP at goal with lisinopril and amlodipine BP Readings from Last 3 Encounters:  08/06/22 120/70   07/29/22 139/75  07/11/22 (!) 129/51    Maintain med doses F/up in 3months      Relevant Orders   AMB Referral to Community Care Coordinaton ([facility] Patients)     Endocrine   Type 2 diabetes mellitus with diabetic polyneuropathy, without long-term current use of insulin (HCC) (Chronic)   Relevant Orders   AMB Referral to Community Care Coordinaton ([facility] Patients)     Musculoskeletal and Integument   Intertrigo   Relevant Medications   nystatin (MYCOSTATIN/NYSTOP) powder  Genitourinary   CKD (chronic kidney disease) stage 3, GFR 30-59 ml/min (HCC)    Stable renal function Persistent LE edema, current use of furosemide daily Stable weight Wt Readings from Last 3 Encounters:  08/06/22 274 lb 6.4 oz (124.5 kg)  07/29/22 270 lb (122.5 kg)  07/11/22 274 lb 7.6 oz (124.5 kg)         Relevant Orders   AMB Referral to Community Care Coordinaton ([facility] Patients)     Other   Chronic pain syndrome - Primary    Reports pain is not controlled Under the care of Dr. Fritzi Mandes (pain clinic) Current use of lyrica 150mg  BID and tramadol 50mg  TID No improvement with spinal injection in past per patient. She does not wish to repeat this. She has limited mobility, so struggles with ADLs. She relies on her family to provide transportation and meals. She lives alone but her daughters and son are involved in her care. She check on her daily. She lives alone. Her home is equipped for a disabled person. She is considering moving in with her son or daughter in the near future. With recent hospitalization, she now has a hospital bed and Home PT services  Advised to continue f/up with pain clinic She agreed to [id number] to help with transportation and meals      Relevant Orders   AMB Referral to [facility] Coordinaton ([facility] Patients)   Other Visit Diagnoses     Limited mobility       Relevant Orders   AMB Referral to [facility] Coordinaton ([facility] Patients)      Return in about 3 months  (around 11/06/2022) for HTN, DM, hyperlipidemia (fasting).     Alysia Penna, NP

## 2022-08-07 ENCOUNTER — Telehealth: Payer: Self-pay | Admitting: *Deleted

## 2022-08-07 DIAGNOSIS — M19041 Primary osteoarthritis, right hand: Secondary | ICD-10-CM | POA: Diagnosis not present

## 2022-08-07 DIAGNOSIS — F32A Depression, unspecified: Secondary | ICD-10-CM | POA: Diagnosis not present

## 2022-08-07 DIAGNOSIS — M19042 Primary osteoarthritis, left hand: Secondary | ICD-10-CM | POA: Diagnosis not present

## 2022-08-07 DIAGNOSIS — G43909 Migraine, unspecified, not intractable, without status migrainosus: Secondary | ICD-10-CM | POA: Diagnosis not present

## 2022-08-07 DIAGNOSIS — J4489 Other specified chronic obstructive pulmonary disease: Secondary | ICD-10-CM | POA: Diagnosis not present

## 2022-08-07 DIAGNOSIS — E1122 Type 2 diabetes mellitus with diabetic chronic kidney disease: Secondary | ICD-10-CM | POA: Diagnosis not present

## 2022-08-07 DIAGNOSIS — D631 Anemia in chronic kidney disease: Secondary | ICD-10-CM | POA: Diagnosis not present

## 2022-08-07 DIAGNOSIS — Z7951 Long term (current) use of inhaled steroids: Secondary | ICD-10-CM | POA: Diagnosis not present

## 2022-08-07 DIAGNOSIS — G894 Chronic pain syndrome: Secondary | ICD-10-CM | POA: Diagnosis not present

## 2022-08-07 DIAGNOSIS — M4802 Spinal stenosis, cervical region: Secondary | ICD-10-CM | POA: Diagnosis not present

## 2022-08-07 DIAGNOSIS — N1831 Chronic kidney disease, stage 3a: Secondary | ICD-10-CM | POA: Diagnosis not present

## 2022-08-07 DIAGNOSIS — G4733 Obstructive sleep apnea (adult) (pediatric): Secondary | ICD-10-CM | POA: Diagnosis not present

## 2022-08-07 DIAGNOSIS — K5900 Constipation, unspecified: Secondary | ICD-10-CM | POA: Diagnosis not present

## 2022-08-07 DIAGNOSIS — E1142 Type 2 diabetes mellitus with diabetic polyneuropathy: Secondary | ICD-10-CM | POA: Diagnosis not present

## 2022-08-07 DIAGNOSIS — I5030 Unspecified diastolic (congestive) heart failure: Secondary | ICD-10-CM | POA: Diagnosis not present

## 2022-08-07 DIAGNOSIS — M797 Fibromyalgia: Secondary | ICD-10-CM | POA: Diagnosis not present

## 2022-08-07 DIAGNOSIS — K5730 Diverticulosis of large intestine without perforation or abscess without bleeding: Secondary | ICD-10-CM | POA: Diagnosis not present

## 2022-08-07 DIAGNOSIS — K8020 Calculus of gallbladder without cholecystitis without obstruction: Secondary | ICD-10-CM | POA: Diagnosis not present

## 2022-08-07 DIAGNOSIS — I130 Hypertensive heart and chronic kidney disease with heart failure and stage 1 through stage 4 chronic kidney disease, or unspecified chronic kidney disease: Secondary | ICD-10-CM | POA: Diagnosis not present

## 2022-08-07 DIAGNOSIS — M1030 Gout due to renal impairment, unspecified site: Secondary | ICD-10-CM | POA: Diagnosis not present

## 2022-08-07 DIAGNOSIS — E785 Hyperlipidemia, unspecified: Secondary | ICD-10-CM | POA: Diagnosis not present

## 2022-08-07 DIAGNOSIS — E559 Vitamin D deficiency, unspecified: Secondary | ICD-10-CM | POA: Diagnosis not present

## 2022-08-07 NOTE — Telephone Encounter (Signed)
   Telephone encounter was:  Successful.  08/07/2022 Name: Sandra Brennan MRN: [id number] DOB: [birth year]  [name] is a 77 y.o. year old female who is a primary care patient of Nche, Bonna Gains, NP . The community resource team was consulted for assistance with Transportation Needs  and Food Insecurity  Care guide performed the following interventions: Patient provided with information about care guide support team and interviewed to confirm resource needs.  Follow Up Plan:  Care guide will follow up with patient by phone over the next week  [name] -[facility] [place], Population Health [phone] [address] [place] , [place] [address] Email : [name]

## 2022-08-10 ENCOUNTER — Emergency Department (HOSPITAL_COMMUNITY)
Admission: EM | Admit: 2022-08-10 | Discharge: 2022-08-10 | Disposition: A | Discharge status: Home / Self Care | Payer: 59 | Attending: Emergency Medicine | Admitting: Emergency Medicine

## 2022-08-10 ENCOUNTER — Encounter (HOSPITAL_COMMUNITY): Payer: Self-pay | Admitting: Pharmacy Technician

## 2022-08-10 ENCOUNTER — Other Ambulatory Visit: Payer: Self-pay

## 2022-08-10 ENCOUNTER — Emergency Department (HOSPITAL_COMMUNITY): Payer: 59

## 2022-08-10 DIAGNOSIS — R519 Headache, unspecified: Secondary | ICD-10-CM | POA: Diagnosis not present

## 2022-08-10 DIAGNOSIS — R0789 Other chest pain: Secondary | ICD-10-CM | POA: Diagnosis not present

## 2022-08-10 DIAGNOSIS — R079 Chest pain, unspecified: Secondary | ICD-10-CM | POA: Diagnosis not present

## 2022-08-10 DIAGNOSIS — I2510 Atherosclerotic heart disease of native coronary artery without angina pectoris: Secondary | ICD-10-CM | POA: Diagnosis not present

## 2022-08-10 DIAGNOSIS — R0602 Shortness of breath: Secondary | ICD-10-CM | POA: Diagnosis not present

## 2022-08-10 LAB — BASIC METABOLIC PANEL
Anion gap: 12 (ref 5–15)
BUN: 8 mg/dL (ref 8–23)
CO2: 22 mmol/L (ref 22–32)
Calcium: 9.2 mg/dL (ref 8.9–10.3)
Chloride: 103 mmol/L (ref 98–111)
Creatinine, Ser: 1.12 mg/dL — ABNORMAL HIGH (ref 0.44–1.00)
GFR, Estimated: 51 mL/min — ABNORMAL LOW (ref >60)
Glucose, Bld: 82 mg/dL (ref 70–99)
Potassium: 3.9 mmol/L (ref 3.5–5.1)
Sodium: 137 mmol/L (ref 135–145)

## 2022-08-10 LAB — TROPONIN I (HIGH SENSITIVITY)
Troponin I (High Sensitivity): 8 ng/L (ref <18)
Troponin I (High Sensitivity): 8 ng/L (ref <18)

## 2022-08-10 LAB — CBC
HCT: 38.6 % (ref 36.0–46.0)
Hemoglobin: 12 g/dL (ref 12.0–15.0)
MCH: 28.2 pg (ref 26.0–34.0)
MCHC: 31.1 g/dL (ref 30.0–36.0)
MCV: 90.8 fL (ref 80.0–100.0)
Platelets: 262 10*3/uL (ref 150–400)
RBC: 4.25 MIL/uL (ref 3.87–5.11)
RDW: 13.2 % (ref 11.5–15.5)
WBC: 6.7 10*3/uL (ref 4.0–10.5)
nRBC: 0 % (ref 0.0–0.2)

## 2022-08-10 MED ORDER — SODIUM CHLORIDE 0.9 % IV BOLUS
500.0000 mL | Freq: Once | INTRAVENOUS | Status: AC
  Administered 2022-08-10: 500 mL via INTRAVENOUS

## 2022-08-10 MED ORDER — MORPHINE SULFATE (PF) 4 MG/ML IV SOLN
4.0000 mg | Freq: Once | INTRAVENOUS | Status: AC
  Administered 2022-08-10: 4 mg via INTRAVENOUS
  Filled 2022-08-10 (×2): qty 1

## 2022-08-10 MED ORDER — HYDRALAZINE HCL 20 MG/ML IJ SOLN
10.0000 mg | Freq: Once | INTRAMUSCULAR | Status: AC
  Administered 2022-08-10: 10 mg via INTRAVENOUS
  Filled 2022-08-10 (×2): qty 1

## 2022-08-10 MED ORDER — IOHEXOL 350 MG/ML SOLN
75.0000 mL | Freq: Once | INTRAVENOUS | Status: AC | PRN
  Administered 2022-08-10: 75 mL via INTRAVENOUS

## 2022-08-10 MED ORDER — MORPHINE SULFATE (PF) 4 MG/ML IV SOLN
4.0000 mg | Freq: Once | INTRAVENOUS | Status: AC
  Administered 2022-08-10: 4 mg via INTRAVENOUS
  Filled 2022-08-10: qty 1

## 2022-08-10 NOTE — ED Provider Notes (Signed)
[place] EMERGENCY DEPARTMENT AT [facility] Provider Note   CSN: [id number] Arrival date & time: 08/10/22  1043     History  Chief Complaint  Patient presents with   Chest Pain   Headache    Sandra Brennan is a 77 y.o. female.  77 year old female with prior medical history as detailed below presents for evaluation.  Patient with multiple complaints including headache, chest pain, diffuse achy pain throughout her body.  Patient with history of fibromyalgia.  Patient reports that her regular pain medications at home have not improved or controlled her pain.   The history is provided by the patient and medical records.       Home Medications Prior to Admission medications   Medication Sig Start Date End Date Taking? Authorizing Provider  acetaminophen (TYLENOL 8 HOUR ARTHRITIS PAIN) 650 MG CR tablet Take 1,300 mg by mouth every 8 (eight) hours as needed (for headaches).    [provider]  albuterol (VENTOLIN HFA) 108 (90 Base) MCG/ACT inhaler INHALE 1 PUFF INTO THE LUNGS EVERY 6 (SIX) HOURS AS NEEDED FOR SHORTNESS OF BREATH OR WHEEZING. 04/16/22   Nche, Bonna Gains, NP  allopurinol (ZYLOPRIM) 100 MG tablet TAKE 1 TABLET BY MOUTH EVERY DAY 07/03/22   Nche, Bonna Gains, NP  amLODipine (NORVASC) 5 MG tablet Take 1 tablet (5 mg total) by mouth every evening. Patient taking differently: Take 5 mg by mouth at bedtime. 06/04/22   Nche, Bonna Gains, NP  aspirin EC 81 MG tablet Take 81 mg by mouth daily.    [provider]  atorvastatin (LIPITOR) 10 MG tablet TAKE 1 TABLET BY MOUTH  DAILY AT 6 PM. Patient taking differently: Take 10 mg by mouth daily at 6 PM. 09/02/21   Nche, Bonna Gains, NP  cetirizine (ZYRTEC) 10 MG tablet Take 10 mg by mouth in the morning.    [provider]  cholecalciferol (VITAMIN D3) 25 MCG (1000 UNIT) tablet Take 1,000 Units by mouth daily.    [provider]  escitalopram (LEXAPRO) 10 MG tablet TAKE 1 TABLET BY  MOUTH EVERY DAY 06/30/22   Nche, Bonna Gains, NP  furosemide (LASIX) 20 MG tablet TAKE 1/2 TABLET BY MOUTH DAILY 07/03/22   Nche, Bonna Gains, NP  lisinopril (ZESTRIL) 40 MG tablet TAKE 1 TABLET BY MOUTH EVERY DAY Patient taking differently: Take 40 mg by mouth in the morning. 05/28/22   Nche, Bonna Gains, NP  Multiple Vitamin (MULTIVITAMIN) tablet Take 1 tablet by mouth daily with breakfast.    [provider]  nystatin (MYCOSTATIN/NYSTOP) powder Apply 1 Application topically 3 (three) times daily. 08/06/22   Nche, Bonna Gains, NP  omeprazole (PRILOSEC) 20 MG capsule TAKE 1 CAPSULE BY MOUTH EVERY DAY Patient taking differently: Take 20 mg by mouth daily before breakfast. 02/21/22   Nche, Bonna Gains, NP  OPCON-A 0.027-0.315 % SOLN Place 1 drop into both eyes 3 (three) times daily as needed (for itching).    [provider]  pregabalin (LYRICA) 150 MG capsule Take 1 capsule (150 mg total) by mouth 2 (two) times daily. 07/11/22   Narda Bonds, MD  Rimegepant Sulfate (NURTEC) 75 MG TBDP Take 1 tablet (75 mg total) by mouth as needed (take 1 at onset of headache, max is 75 in 24 hours). Patient taking differently: Take 75 mg by mouth as needed (at the onset of a headache- max daily dose is 75 mg/24 hours). 06/25/22   Butch Penny, NP  Semaglutide,0.25 or 0.5MG /DOS, (  OZEMPIC, 0.25 OR 0.5 MG/DOSE,) 2 MG/3ML SOPN Inject 0.5 mg into the skin once a week. Patient taking differently: Inject 0.5 mg into the skin every Tuesday. 04/29/22   Nche, Bonna Gains, NP  SYMBICORT 160-4.5 MCG/ACT inhaler INHALE 2 PUFFS INTO THE LUNGS IN THE MORNING AND AT BEDTIME. RINSE MOUTH AFTER EACH USE Patient taking differently: Inhale 1 puff into the lungs in the morning and at bedtime. Rinse mouth after each use 07/03/22   Nche, Bonna Gains, NP  traMADol (ULTRAM) 50 MG tablet Take 1 tablet (50 mg total) by mouth 3 (three) times daily. 07/11/22   Narda Bonds, MD      Allergies    Cymbalta  [duloxetine hcl], Pineapple, Sulfa antibiotics, Influenza vaccines, Other, Penicillins, and Theophyllines    Review of Systems   Review of Systems  All other systems reviewed and are negative.   Physical Exam Updated Vital Signs BP (!) 180/75 (BP Location: Left Arm)   Pulse 80   Temp 97.8 F (36.6 C) (Oral)   Resp 17   SpO2 98%  Physical Exam Vitals and nursing note reviewed.  Constitutional:      General: She is not in acute distress.    Appearance: Normal appearance. She is well-developed.  HENT:     Head: Normocephalic and atraumatic.  Eyes:     Conjunctiva/sclera: Conjunctivae normal.     Pupils: Pupils are equal, round, and reactive to light.  Cardiovascular:     Rate and Rhythm: Normal rate and regular rhythm.     Heart sounds: Normal heart sounds.  Pulmonary:     Effort: Pulmonary effort is normal. No respiratory distress.     Breath sounds: Normal breath sounds.  Abdominal:     General: There is no distension.     Palpations: Abdomen is soft.     Tenderness: There is no abdominal tenderness.  Musculoskeletal:        General: No deformity. Normal range of motion.     Cervical back: Normal range of motion and neck supple.  Skin:    General: Skin is warm and dry.  Neurological:     General: No focal deficit present.     Mental Status: She is alert and oriented to person, place, and time.     ED Results / Procedures / Treatments   Labs (all labs ordered are listed, but only abnormal results are displayed) Labs Reviewed  BASIC METABOLIC PANEL - Abnormal; Notable for the following components:      Result Value   Creatinine, Ser 1.12 (*)    GFR, Estimated 51 (*)    All other components within normal limits  CBC  TROPONIN I (HIGH SENSITIVITY)  TROPONIN I (HIGH SENSITIVITY)    EKG EKG Interpretation  Date/Time:  Sunday August 10 2022 10:37:18 EDT Ventricular Rate:  81 PR Interval:  134 QRS Duration: 90 QT Interval:  384 QTC Calculation: 446 R  Axis:   0 Text Interpretation: Normal sinus rhythm Minimal voltage criteria for LVH, may be normal variant ( R in aVL ) T wave abnormality, consider inferior ischemia Abnormal ECG When compared with ECG of 06-Jul-2022 14:32, PREVIOUS ECG IS PRESENT Confirmed by Kristine Royal [phone]) on 08/10/2022 12:08:40 PM  Radiology CT Angio Chest PE W and/or Wo Contrast  Result Date: 08/10/2022 CLINICAL DATA:  Low to intermediate probability pulmonary embolism. Negative D-dimer. EXAM: CT ANGIOGRAPHY CHEST WITH CONTRAST TECHNIQUE: Multidetector CT imaging of the chest was performed using the standard protocol during bolus administration  of intravenous contrast. Multiplanar CT image reconstructions and MIPs were obtained to evaluate the vascular anatomy. RADIATION DOSE REDUCTION: This exam was performed according to the departmental dose-optimization program which includes automated exposure control, adjustment of the mA and/or kV according to patient size and/or use of iterative reconstruction technique. CONTRAST:  75mL OMNIPAQUE IOHEXOL 350 MG/ML SOLN COMPARISON:  CT angio chest, abdomen and pelvis 07/06/2022 FINDINGS: Cardiovascular: Suboptimal opacification of the pulmonary arteries to the segmental level. No evidence of pulmonary embolism. Mild enlargement of the cardiac silhouette. Aortic atherosclerosis. Coronary artery calcifications. The pulmonary artery measures 3.8 cm in diameter. Mediastinum/Nodes: No enlarged mediastinal, hilar, or axillary lymph nodes. Thyroid gland, trachea, and esophagus demonstrate no significant findings. Lungs/Pleura: No pleural effusion. No airspace consolidation, atelectasis or pneumothorax. Mild mosaic attenuation pattern which may reflect underlying small airways disease. Upper Abdomen: No acute abnormality.  Gallstones. Musculoskeletal: Severe degenerative change identified within both glenohumeral joints. Multilevel degenerative disc disease noted within the thoracolumbar spine. Review  of the MIP images confirms the above findings. IMPRESSION: 1. Suboptimal opacification of the pulmonary arteries to the segmental level. No evidence for acute pulmonary embolism. 2. Mild mosaic attenuation pattern which may reflect underlying small airways disease. 3. Coronary artery calcifications. 4. Gallstones. 5.  Aortic Atherosclerosis (ICD10-I70.0). Electronically Signed   By: Signa Kell M.D.   On: 08/10/2022 14:17   CT Head Wo Contrast  Result Date: 08/10/2022 CLINICAL DATA:  Headache with increasing frequency. EXAM: CT HEAD WITHOUT CONTRAST TECHNIQUE: Contiguous axial images were obtained from the base of the skull through the vertex without intravenous contrast. RADIATION DOSE REDUCTION: This exam was performed according to the departmental dose-optimization program which includes automated exposure control, adjustment of the mA and/or kV according to patient size and/or use of iterative reconstruction technique. COMPARISON:  07/06/22 FINDINGS: Brain: No evidence of acute infarction, hemorrhage, hydrocephalus, extra-axial collection or mass lesion/mass effect. Vascular: No hyperdense vessel or unexpected calcification. Skull: No fracture or focal lesion.  Hyperostosis is again noted. Sinuses/Orbits: No acute finding. Other: None. IMPRESSION: No acute intracranial abnormality. Electronically Signed   By: Signa Kell M.D.   On: 08/10/2022 13:59   DG Chest 2 View  Result Date: 08/10/2022 CLINICAL DATA:  Chest pain, shortness of breath. EXAM: CHEST - 2 VIEW COMPARISON:  Chest radiograph and chest CT 07/06/2022 FINDINGS: The heart is enlarged, unchanged. The upper mediastinal contours are within normal limits There is no focal consolidation or pulmonary edema. There is no pleural effusion or pneumothorax There is no acute osseous abnormality. There is advanced degenerative change of the left shoulder. IMPRESSION: Unchanged cardiomegaly. No radiographic evidence of acute cardiopulmonary process.  Electronically Signed   By: Lesia Hausen M.D.   On: 08/10/2022 12:15    Procedures Procedures    Medications Ordered in ED Medications  sodium chloride 0.9 % bolus 500 mL (0 mLs Intravenous Stopped 08/10/22 1517)  hydrALAZINE (APRESOLINE) injection 10 mg (10 mg Intravenous Given 08/10/22 1325)  morphine (PF) 4 MG/ML injection 4 mg (4 mg Intravenous Given 08/10/22 1327)  iohexol (OMNIPAQUE) 350 MG/ML injection 75 mL (75 mLs Intravenous Contrast Given 08/10/22 1349)  morphine (PF) 4 MG/ML injection 4 mg (4 mg Intravenous Given 08/10/22 1523)    ED Course/ Medical Decision Making/ A&P                             Medical Decision Making Amount and/or Complexity of Data Reviewed Labs: ordered. Radiology: ordered.  Risk Prescription drug management.    Medical Screen Complete  This patient presented to the ED with complaint of headache, chest pain, myalgia  This complaint involves an extensive number of treatment options. The initial differential diagnosis includes, but is not limited to, ACS, angina, PE, metabolic abnormality  This presentation is: Acute, Chronic, Self-Limited, Previously Undiagnosed, Uncertain Prognosis, Complicated, Systemic Symptoms, and Threat to Life/Bodily Function  Patient is presenting with diffuse complaints including headache, chest pain.  Describe symptoms are not consistent with ACS.  Out of abundance of caution screening labs and imaging of obtained.  Patient's workup is without evidence of significant acute pathology.  Notably, EKG is without evidence of acute ischemia.  Troponin x 2 is barely detectable.  CT imaging of the chest is without evidence of PE or other significant acute pathology.  CT imaging of the head is without acute pathology.  Patient feels improved after ED evaluation and treatment.    Patient is advised to follow-up closely with regular outpatient care including her pain specialist.  If she requires additional pain medication in the  outpatient setting she is advised to obtain this from her pain specialist.  Importance of close follow-up is stressed.  Strict return precautions given and understood.   Additional history obtained:  External records from outside sources obtained and reviewed including prior ED visits and prior Inpatient records.    Lab Tests:  I ordered and personally interpreted labs.    Imaging Studies ordered:  I ordered imaging studies including CT head, CT chest, plain films of chest I independently visualized and interpreted obtained imaging which showed NAD I agree with the radiologist interpretation.   Cardiac Monitoring:  The patient was maintained on a cardiac monitor.  I personally viewed and interpreted the cardiac monitor which showed an underlying rhythm of: NSR   Medicines ordered:  I ordered medication including morphine, hydralazine, IV fluids for pain, hypertension Reevaluation of the patient after these medicines showed that the patient: improved    Problem List / ED Course:  Myalgia, headache, atypical chest pain   Reevaluation:  After the interventions noted above, I reevaluated the patient and found that they have: improved  Disposition:  After consideration of the diagnostic results and the patients response to treatment, I feel that the patent would benefit from close outpatient follow-up.          Final Clinical Impression(s) / ED Diagnoses Final diagnoses:  Acute nonintractable headache, unspecified headache type  Atypical chest pain    Rx / DC Orders ED Discharge Orders     None         Wynetta Fines, MD 08/10/22 2115

## 2022-08-10 NOTE — ED Notes (Signed)
Pt is a&ox4, warm and dry to touch. Pt is complaining of chest pain and a HA. Pt has hx of migraines. Pt denies any other pain or complaints. Pt is already changed into a gown. Attached to monitor/vitals. Side rails up x 2, call light within reach. Family at the bedside.

## 2022-08-10 NOTE — ED Triage Notes (Signed)
Pt to the ED POV with reports of chest pain, shob, diaphoresis and headache onset last night. Pt hypertensive in triage, has not had BP meds this morning.

## 2022-08-10 NOTE — Discharge Instructions (Addendum)
Return for any problem.  ?

## 2022-08-11 ENCOUNTER — Telehealth: Payer: Self-pay

## 2022-08-11 NOTE — Telephone Encounter (Signed)
Pt returned your call. I asked when she said last night, she was feeling a little better. They did not give her any meds.  They told her her pain meds were not working and that is the reason her BP went up. When she went in it was 201/98 coming out it was 189/98.

## 2022-08-11 NOTE — Transitions of Care (Post Inpatient/ED Visit) (Unsigned)
   08/11/2022  Name: Sandra Brennan MRN: [id number] DOB: 1945-11-03  Today's TOC FU Call Status: Unsuccessful Call (1st Attempt) Date: 08/11/22  Attempted to reach the patient regarding the most recent Inpatient/ED visit.  Follow Up Plan: Additional outreach attempts will be made to reach the patient to complete the Transitions of Care (Post Inpatient/ED visit) call.   Signature Arvil Persons, BSN, Charity fundraiser

## 2022-08-12 ENCOUNTER — Telehealth: Payer: Self-pay

## 2022-08-12 NOTE — Telephone Encounter (Signed)
PAPERWORK/FORMS received  Dropped off by:  Call back #:  Individual made aware of 3-5 business day turn around (YES/NO):  GREEN charge sheet completed and patient made aware of possible charge (YES/NO): yes Placed in provider folder at front desk. ~~~ route to CMA/provider Team  CLINICAL USE BELOW THIS LINE (use X to signify action taken)  _x__ Form received and placed in providers office for signature. _X__ Form completed and faxed  ___ Form completed & LVM to notify patient ready for pick up.  __X_ Charge sheet and copy of form in front office folder for office supervisor.

## 2022-08-12 NOTE — Transitions of Care (Post Inpatient/ED Visit) (Signed)
08/12/2022  Name: Sandra Brennan MRN: [id number] DOB: 12/12/1945  Today's TOC FU Call Status: Today's TOC FU Call Status:: Successful TOC FU Call Competed Unsuccessful Call (1st Attempt) Date: 08/11/22 [facility] FU Call Complete Date: 08/12/22  Transition Care Management Follow-up Telephone Call Date of Discharge: 08/10/22 Discharge Facility: [facility]) Type of Discharge: Emergency Department Primary Inpatient Discharge Diagnosis:: Headache How have you been since you were released from the hospital?: Same Any questions or concerns?: No Patient Questions/Concerns:: Pt is having diarrhea and unable to sleep.  Items Reviewed: Did you receive and understand the discharge instructions provided?: Yes Medications obtained,verified, and reconciled?: Yes (Medications Reviewed) Any new allergies since your discharge?: No Dietary orders reviewed?: No Do you have support at home?: Yes People in Home: child(ren), adult  Medications Reviewed Today: Medications Reviewed Today     Reviewed by Larey Dresser, RN (Registered Nurse) on 08/12/22 at 1623  Med List Status: <None>   Medication Order Taking? Sig Documenting Provider Last Dose Status Informant  acetaminophen (TYLENOL 8 HOUR ARTHRITIS PAIN) 650 MG CR tablet [id number] No Take 1,300 mg by mouth every 8 (eight) hours as needed (for headaches). [provider] Taking Active Self  albuterol (VENTOLIN HFA) 108 (90 Base) MCG/ACT inhaler [id number] No INHALE 1 PUFF INTO THE LUNGS EVERY 6 (SIX) HOURS AS NEEDED FOR SHORTNESS OF BREATH OR WHEEZING. Anne Ng, NP Taking Active Self  allopurinol (ZYLOPRIM) 100 MG tablet [id number] No TAKE 1 TABLET BY MOUTH EVERY DAY Nche, Bonna Gains, NP Taking Active Self  amLODipine (NORVASC) 5 MG tablet [id number] No Take 1 tablet (5 mg total) by mouth every evening.  Patient taking differently: Take 5 mg by mouth at bedtime.   Anne Ng, NP Taking Active Self  aspirin EC 81 MG  tablet [id number] No Take 81 mg by mouth daily. [provider] Taking Active Self  atorvastatin (LIPITOR) 10 MG tablet [id number] No TAKE 1 TABLET BY MOUTH  DAILY AT 6 PM.  Patient taking differently: Take 10 mg by mouth daily at 6 PM.   Nche, Bonna Gains, NP Taking Active Self  cetirizine (ZYRTEC) 10 MG tablet [id number] No Take 10 mg by mouth in the morning. [provider] Taking Active Self  cholecalciferol (VITAMIN D3) 25 MCG (1000 UNIT) tablet [id number] No Take 1,000 Units by mouth daily. [provider] Taking Active Self  escitalopram (LEXAPRO) 10 MG tablet [id number] No TAKE 1 TABLET BY MOUTH EVERY DAY Nche, Bonna Gains, NP Taking Active Self  furosemide (LASIX) 20 MG tablet [id number] No TAKE 1/2 TABLET BY MOUTH DAILY Nche, Bonna Gains, NP Taking Active Self  lisinopril (ZESTRIL) 40 MG tablet [id number] No TAKE 1 TABLET BY MOUTH EVERY DAY  Patient taking differently: Take 40 mg by mouth in the morning.   Anne Ng, NP Taking Active Self  Multiple Vitamin (MULTIVITAMIN) tablet [id number] No Take 1 tablet by mouth daily with breakfast. [provider] Taking Active Self  nystatin (MYCOSTATIN/NYSTOP) powder [id number]  Apply 1 Application topically 3 (three) times daily. Nche, Bonna Gains, NP  Active   omeprazole (PRILOSEC) 20 MG capsule [id number] No TAKE 1 CAPSULE BY MOUTH EVERY DAY  Patient taking differently: Take 20 mg by mouth daily before breakfast.   Nche, Bonna Gains, NP Taking Active Self  OPCON-A 0.027-0.315 % SOLN [id number] No Place 1 drop into both eyes 3 (three) times daily as needed (for itching). [provider] Taking Active Self  pregabalin (LYRICA) 150 MG capsule [id number]  No Take 1 capsule (150 mg total) by mouth 2 (two) times daily. Narda Bonds, MD Taking Active   Rimegepant Sulfate (NURTEC) 75 MG TBDP [id number] No Take 1 tablet (75 mg total) by mouth as needed (take 1 at onset of headache, max is 75 in 24  hours).  Patient taking differently: Take 75 mg by mouth as needed (at the onset of a headache- max daily dose is 75 mg/24 hours).   Butch Penny, NP Taking Active Self  Semaglutide,0.25 or 0.5MG /DOS, (OZEMPIC, 0.25 OR 0.5 MG/DOSE,) 2 MG/3ML SOPN [id number] No Inject 0.5 mg into the skin once a week.  Patient taking differently: Inject 0.5 mg into the skin every Tuesday.   Anne Ng, NP Taking Active Self  SYMBICORT 160-4.5 MCG/ACT inhaler [id number] No INHALE 2 PUFFS INTO THE LUNGS IN THE MORNING AND AT BEDTIME. RINSE MOUTH AFTER EACH USE  Patient taking differently: Inhale 1 puff into the lungs in the morning and at bedtime. Rinse mouth after each use   Nche, Bonna Gains, NP Taking Active Self  traMADol (ULTRAM) 50 MG tablet [id number] No Take 1 tablet (50 mg total) by mouth 3 (three) times daily. Narda Bonds, MD Taking Active             Home Care and Equipment/Supplies: Were Home Health Services Ordered?: NA Any new equipment or medical supplies ordered?: NA  Functional Questionnaire: Do you need assistance with bathing/showering or dressing?: No Do you need assistance with meal preparation?: Yes Do you need assistance with eating?: No Do you have difficulty maintaining continence: No Do you need assistance with getting out of bed/getting out of a chair/moving?: Yes Do you have difficulty managing or taking your medications?: No  Follow up appointments reviewed: PCP Follow-up appointment confirmed?: NA Specialist Hospital Follow-up appointment confirmed?: NA Do you need transportation to your follow-up appointment?: No Do you understand care options if your condition(s) worsen?: Yes-patient verbalized understanding    SIGNATURE Arvil Persons, BSN, RN

## 2022-08-17 ENCOUNTER — Other Ambulatory Visit: Payer: Self-pay | Admitting: Nurse Practitioner

## 2022-08-17 DIAGNOSIS — Z6841 Body Mass Index (BMI) 40.0 and over, adult: Secondary | ICD-10-CM

## 2022-08-17 DIAGNOSIS — E1142 Type 2 diabetes mellitus with diabetic polyneuropathy: Secondary | ICD-10-CM

## 2022-08-21 DIAGNOSIS — M19041 Primary osteoarthritis, right hand: Secondary | ICD-10-CM | POA: Diagnosis not present

## 2022-08-21 DIAGNOSIS — K5900 Constipation, unspecified: Secondary | ICD-10-CM | POA: Diagnosis not present

## 2022-08-21 DIAGNOSIS — D631 Anemia in chronic kidney disease: Secondary | ICD-10-CM | POA: Diagnosis not present

## 2022-08-21 DIAGNOSIS — N1831 Chronic kidney disease, stage 3a: Secondary | ICD-10-CM | POA: Diagnosis not present

## 2022-08-21 DIAGNOSIS — I130 Hypertensive heart and chronic kidney disease with heart failure and stage 1 through stage 4 chronic kidney disease, or unspecified chronic kidney disease: Secondary | ICD-10-CM | POA: Diagnosis not present

## 2022-08-21 DIAGNOSIS — E1122 Type 2 diabetes mellitus with diabetic chronic kidney disease: Secondary | ICD-10-CM | POA: Diagnosis not present

## 2022-08-21 DIAGNOSIS — E785 Hyperlipidemia, unspecified: Secondary | ICD-10-CM | POA: Diagnosis not present

## 2022-08-21 DIAGNOSIS — M1030 Gout due to renal impairment, unspecified site: Secondary | ICD-10-CM | POA: Diagnosis not present

## 2022-08-21 DIAGNOSIS — G894 Chronic pain syndrome: Secondary | ICD-10-CM | POA: Diagnosis not present

## 2022-08-21 DIAGNOSIS — J4489 Other specified chronic obstructive pulmonary disease: Secondary | ICD-10-CM | POA: Diagnosis not present

## 2022-08-21 DIAGNOSIS — M4802 Spinal stenosis, cervical region: Secondary | ICD-10-CM | POA: Diagnosis not present

## 2022-08-21 DIAGNOSIS — E1142 Type 2 diabetes mellitus with diabetic polyneuropathy: Secondary | ICD-10-CM | POA: Diagnosis not present

## 2022-08-21 DIAGNOSIS — M19042 Primary osteoarthritis, left hand: Secondary | ICD-10-CM | POA: Diagnosis not present

## 2022-08-21 DIAGNOSIS — M797 Fibromyalgia: Secondary | ICD-10-CM | POA: Diagnosis not present

## 2022-08-21 DIAGNOSIS — I5030 Unspecified diastolic (congestive) heart failure: Secondary | ICD-10-CM | POA: Diagnosis not present

## 2022-08-21 DIAGNOSIS — K8020 Calculus of gallbladder without cholecystitis without obstruction: Secondary | ICD-10-CM | POA: Diagnosis not present

## 2022-08-21 DIAGNOSIS — E559 Vitamin D deficiency, unspecified: Secondary | ICD-10-CM | POA: Diagnosis not present

## 2022-08-21 DIAGNOSIS — G43909 Migraine, unspecified, not intractable, without status migrainosus: Secondary | ICD-10-CM | POA: Diagnosis not present

## 2022-08-21 DIAGNOSIS — G4733 Obstructive sleep apnea (adult) (pediatric): Secondary | ICD-10-CM | POA: Diagnosis not present

## 2022-08-21 DIAGNOSIS — Z7951 Long term (current) use of inhaled steroids: Secondary | ICD-10-CM | POA: Diagnosis not present

## 2022-08-21 DIAGNOSIS — K5730 Diverticulosis of large intestine without perforation or abscess without bleeding: Secondary | ICD-10-CM | POA: Diagnosis not present

## 2022-08-21 DIAGNOSIS — F32A Depression, unspecified: Secondary | ICD-10-CM | POA: Diagnosis not present

## 2022-08-24 DIAGNOSIS — G4733 Obstructive sleep apnea (adult) (pediatric): Secondary | ICD-10-CM | POA: Diagnosis not present

## 2022-08-25 ENCOUNTER — Telehealth: Payer: Self-pay | Admitting: *Deleted

## 2022-08-25 NOTE — Telephone Encounter (Signed)
   Telephone encounter was:  Unsuccessful.  08/25/2022 Name: Sandra Brennan MRN: [id number] DOB: 1945/07/16  Unsuccessful outbound call made today to assist with:  Transportation Needs  and Food Insecurity  Outreach Attempt:  1st Attempt patient follow up  A HIPAA compliant voice message was left requesting a return call.  Instructed patient to call back at [phone].  Yehuda Mao Greenauer -[facility] [place], Population Health [phone] [address] [place] , [place] [address] Email : [name]

## 2022-08-26 ENCOUNTER — Other Ambulatory Visit: Payer: Self-pay | Admitting: Nurse Practitioner

## 2022-08-26 ENCOUNTER — Telehealth: Payer: Self-pay | Admitting: *Deleted

## 2022-08-26 NOTE — Telephone Encounter (Signed)
   Telephone encounter was:  Unsuccessful.  08/26/2022 Name: KEMYA SHED MRN: [id number] DOB: 20-Jul-1945  Unsuccessful outbound call made today to assist with:  Food Insecurity  Outreach Attempt:  3rd Attempt.  Referral closed unable to contact patient.   Patient reached unable to complete the follow up but patient has transportation and food was made available to her through [facility] and medicaid   Voicemail full  Nkenge Sonntag Greenauer -[facility] [place], Population Health [phone] [address] [place] , [place] [address] Email : [name]

## 2022-08-28 ENCOUNTER — Telehealth: Payer: Self-pay

## 2022-08-28 DIAGNOSIS — M19042 Primary osteoarthritis, left hand: Secondary | ICD-10-CM | POA: Diagnosis not present

## 2022-08-28 DIAGNOSIS — D631 Anemia in chronic kidney disease: Secondary | ICD-10-CM | POA: Diagnosis not present

## 2022-08-28 DIAGNOSIS — M4802 Spinal stenosis, cervical region: Secondary | ICD-10-CM | POA: Diagnosis not present

## 2022-08-28 DIAGNOSIS — I130 Hypertensive heart and chronic kidney disease with heart failure and stage 1 through stage 4 chronic kidney disease, or unspecified chronic kidney disease: Secondary | ICD-10-CM | POA: Diagnosis not present

## 2022-08-28 DIAGNOSIS — E785 Hyperlipidemia, unspecified: Secondary | ICD-10-CM | POA: Diagnosis not present

## 2022-08-28 DIAGNOSIS — J4489 Other specified chronic obstructive pulmonary disease: Secondary | ICD-10-CM | POA: Diagnosis not present

## 2022-08-28 DIAGNOSIS — K5900 Constipation, unspecified: Secondary | ICD-10-CM | POA: Diagnosis not present

## 2022-08-28 DIAGNOSIS — F32A Depression, unspecified: Secondary | ICD-10-CM | POA: Diagnosis not present

## 2022-08-28 DIAGNOSIS — Z7951 Long term (current) use of inhaled steroids: Secondary | ICD-10-CM | POA: Diagnosis not present

## 2022-08-28 DIAGNOSIS — M19041 Primary osteoarthritis, right hand: Secondary | ICD-10-CM | POA: Diagnosis not present

## 2022-08-28 DIAGNOSIS — K8020 Calculus of gallbladder without cholecystitis without obstruction: Secondary | ICD-10-CM | POA: Diagnosis not present

## 2022-08-28 DIAGNOSIS — E1142 Type 2 diabetes mellitus with diabetic polyneuropathy: Secondary | ICD-10-CM | POA: Diagnosis not present

## 2022-08-28 DIAGNOSIS — M1030 Gout due to renal impairment, unspecified site: Secondary | ICD-10-CM | POA: Diagnosis not present

## 2022-08-28 DIAGNOSIS — I5030 Unspecified diastolic (congestive) heart failure: Secondary | ICD-10-CM | POA: Diagnosis not present

## 2022-08-28 DIAGNOSIS — K5730 Diverticulosis of large intestine without perforation or abscess without bleeding: Secondary | ICD-10-CM | POA: Diagnosis not present

## 2022-08-28 DIAGNOSIS — M797 Fibromyalgia: Secondary | ICD-10-CM | POA: Diagnosis not present

## 2022-08-28 DIAGNOSIS — G4733 Obstructive sleep apnea (adult) (pediatric): Secondary | ICD-10-CM | POA: Diagnosis not present

## 2022-08-28 DIAGNOSIS — G43909 Migraine, unspecified, not intractable, without status migrainosus: Secondary | ICD-10-CM | POA: Diagnosis not present

## 2022-08-28 DIAGNOSIS — N1831 Chronic kidney disease, stage 3a: Secondary | ICD-10-CM | POA: Diagnosis not present

## 2022-08-28 DIAGNOSIS — E559 Vitamin D deficiency, unspecified: Secondary | ICD-10-CM | POA: Diagnosis not present

## 2022-08-28 DIAGNOSIS — G894 Chronic pain syndrome: Secondary | ICD-10-CM | POA: Diagnosis not present

## 2022-08-28 DIAGNOSIS — E1122 Type 2 diabetes mellitus with diabetic chronic kidney disease: Secondary | ICD-10-CM | POA: Diagnosis not present

## 2022-08-28 NOTE — Patient Outreach (Signed)
  Care Coordination   08/28/2022 Name: Sandra Brennan MRN: [id number] DOB: September 04, 1945   Care Coordination Outreach Attempts:  An unsuccessful telephone outreach was attempted today to offer the patient information about available care coordination services.  Follow Up Plan:  Additional outreach attempts will be made to offer the patient care coordination information and services.   Encounter Outcome:  No Answer   Care Coordination Interventions:  No, not indicated    Juanell Fairly RN, BSN, [facility] Care Coordinator [facility]   Phone: [phone]

## 2022-09-01 DIAGNOSIS — J449 Chronic obstructive pulmonary disease, unspecified: Secondary | ICD-10-CM | POA: Diagnosis not present

## 2022-09-01 DIAGNOSIS — I5032 Chronic diastolic (congestive) heart failure: Secondary | ICD-10-CM | POA: Diagnosis not present

## 2022-09-01 DIAGNOSIS — M6281 Muscle weakness (generalized): Secondary | ICD-10-CM | POA: Diagnosis not present

## 2022-09-01 DIAGNOSIS — M1A00X0 Idiopathic chronic gout, unspecified site, without tophus (tophi): Secondary | ICD-10-CM | POA: Diagnosis not present

## 2022-09-01 DIAGNOSIS — R2689 Other abnormalities of gait and mobility: Secondary | ICD-10-CM | POA: Diagnosis not present

## 2022-09-02 ENCOUNTER — Ambulatory Visit: Payer: Self-pay

## 2022-09-02 NOTE — Patient Outreach (Signed)
  Care Coordination   Follow Up Visit Note   09/02/2022 Name: Sandra Brennan MRN: [id number] DOB: September 17, 1945  Sandra Brennan is a 77 y.o. year old female who sees Nche, Bonna Gains, NP for primary care. I spoke with  Sandra Brennan by phone today.  What matters to the patients health and wellness today?  Sandra Brennan is doing well. She has been taking things one day at a time since she completed her rehab. The doctors have advised her against living alone. They recommend that she should speak with a counselor about the loss of her son, increase her water intake, and have three meals a day. It's challenging for her not to be alone as she takes care of her grandson, and her other son and daughter don't live close to her church or her grandson's school. Therefore, she remains at home and relies on neighbors to check on her. Her blood pressure readings are 140/98 and 137/98, and she hasn't experienced any headaches or chest pain. She is seeking counseling from her pastor, and I informed her that Sandra Brennan BSW will be contacting her to discuss the medical alert bracelet.     Goals Addressed             This Visit's Progress    I want to manage my blood pressure         Patient Goals/Self Care Activities: -Patient/Caregiver will self-administer medications as prescribed as evidenced by self-report/primary caregiver report  -Patient/Caregiver will attend all scheduled provider appointments as evidenced by clinician review of documented attendance to scheduled appointments and patient/caregiver report -Patient/Caregiver will call pharmacy for medication refills as evidenced by patient report and review of pharmacy fill history as appropriate -Patient/Caregiver will call provider office for new concerns or questions as evidenced by review of documented incoming telephone call notes and patient report -Patient/Caregiver will focus on medication adherence by taking medications as prescribed   -Calls provider office for new concerns, questions, or BP outside discussed parameters -Checks BP and records as discussed -Stay hydrated -Eat three meals a day BP Readings from Last 3 Encounters:  08/10/22 (!) 175/86  08/06/22 120/70  07/29/22 139/75         SDOH assessments and interventions completed:  No     Care Coordination Interventions:  Yes, provided   Interventions Today    Flowsheet Row Most Recent Value  Chronic Disease   Chronic disease during today's visit Hypertension (HTN), Other  [Stay hydrated eating three meals]  General Interventions   General Interventions Discussed/Reviewed General Interventions Discussed, General Interventions Reviewed  Education Interventions   Education Provided Provided Education  Provided Verbal Education On Nutrition  Mental Health Interventions   Mental Health Discussed/Reviewed Mental Health Discussed  Pharmacy Interventions   Pharmacy Dicussed/Reviewed Pharmacy Topics Discussed  Safety Interventions   Safety Discussed/Reviewed Safety Discussed        Follow up plan: Follow up call scheduled for 10/03/22  930 am    Encounter Outcome:  Pt. Visit Completed   Juanell Fairly RN, BSN, [facility] Care Coordinator [facility]   Phone: [phone]

## 2022-09-02 NOTE — Patient Instructions (Signed)
Visit Information  Thank you for taking time to visit with me today. Please don't hesitate to contact me if I can be of assistance to you.   Following are the goals we discussed today:   Goals Addressed             This Visit's Progress    I want to manage my blood pressure         Patient Goals/Self Care Activities: -Patient/Caregiver will self-administer medications as prescribed as evidenced by self-report/primary caregiver report  -Patient/Caregiver will attend all scheduled provider appointments as evidenced by clinician review of documented attendance to scheduled appointments and patient/caregiver report -Patient/Caregiver will call pharmacy for medication refills as evidenced by patient report and review of pharmacy fill history as appropriate -Patient/Caregiver will call provider office for new concerns or questions as evidenced by review of documented incoming telephone call notes and patient report -Patient/Caregiver will focus on medication adherence by taking medications as prescribed  -Calls provider office for new concerns, questions, or BP outside discussed parameters -Checks BP and records as discussed -Stay hydrated -Eat three meals a day BP Readings from Last 3 Encounters:  08/10/22 (!) 175/86  08/06/22 120/70  07/29/22 139/75         Our next appointment is by telephone on 10/03/22 at 930 am  Please call the care guide team at [phone] if you need to cancel or reschedule your appointment.   If you are experiencing a Mental Health or Behavioral Health Crisis or need someone to talk to, please call 1-800-273-TALK (toll free, 24 hour hotline)  The patient verbalized understanding of instructions, educational materials, and care plan provided today.   Juanell Fairly RN, BSN, [facility] Care Coordinator [facility]   Phone: [phone]

## 2022-09-03 ENCOUNTER — Telehealth: Payer: Self-pay

## 2022-09-03 NOTE — Patient Outreach (Signed)
  Care Coordination   09/03/2022 Name: Sandra Brennan MRN: [id number] DOB: 08-31-1945   Care Coordination Outreach Attempts:  An unsuccessful telephone outreach was attempted for a scheduled appointment today.  Follow Up Plan:  Additional outreach attempts will be made to offer the patient care coordination information and services.   Encounter Outcome:  No Answer   Care Coordination Interventions:  No, not indicated    Bevelyn Ngo, BSW, CDP Social Worker, Certified Dementia Practitioner [facility] Care Management  Care Coordination [phone]

## 2022-09-09 ENCOUNTER — Telehealth: Payer: Self-pay | Admitting: *Deleted

## 2022-09-09 NOTE — Progress Notes (Unsigned)
  Care Coordination Note  09/09/2022 Name: Sandra Brennan MRN: [id number] DOB: 1945/08/06  Sandra Brennan is a 77 y.o. year old female who is a primary care patient of Nche, Bonna Gains, NP and is actively engaged with the care management team. I reached out to Sandra Brennan by phone today to assist with scheduling an initial visit with the BSW  Follow up plan: Unsuccessful telephone outreach attempt made. A HIPAA compliant phone message was left for the patient providing contact information and requesting a return call.  [facility]  Care Coordination Care Guide  Direct Dial: [phone]

## 2022-09-10 NOTE — Progress Notes (Signed)
  Care Coordination Note  09/10/2022 Name: ABYGALE KARPF MRN: [id number] DOB: January 27, 1946  Dub Mikes Ellerman is a 77 y.o. year old female who is a primary care patient of Nche, Bonna Gains, NP and is actively engaged with the care management team. I reached out to Virgel Manifold by phone today to assist with re-scheduling an initial visit with the BSW  Follow up plan: Telephone appointment with care management team member scheduled for:09/23/22  [facility] Coordination Care Guide  Direct Dial: [phone]

## 2022-09-11 ENCOUNTER — Ambulatory Visit: Payer: 59 | Admitting: Nurse Practitioner

## 2022-09-17 ENCOUNTER — Encounter: Payer: Self-pay | Admitting: Nurse Practitioner

## 2022-09-17 ENCOUNTER — Ambulatory Visit: Payer: 59 | Admitting: Nurse Practitioner

## 2022-09-17 VITALS — BP 160/90 | HR 71 | Temp 98.0°F | Ht 59.0 in | Wt 269.0 lb

## 2022-09-17 DIAGNOSIS — I10 Essential (primary) hypertension: Secondary | ICD-10-CM | POA: Diagnosis not present

## 2022-09-17 DIAGNOSIS — R531 Weakness: Secondary | ICD-10-CM

## 2022-09-17 DIAGNOSIS — M48061 Spinal stenosis, lumbar region without neurogenic claudication: Secondary | ICD-10-CM

## 2022-09-17 DIAGNOSIS — Z9181 History of falling: Secondary | ICD-10-CM

## 2022-09-17 DIAGNOSIS — M47816 Spondylosis without myelopathy or radiculopathy, lumbar region: Secondary | ICD-10-CM | POA: Diagnosis not present

## 2022-09-17 MED ORDER — AMLODIPINE BESYLATE 10 MG PO TABS
10.0000 mg | ORAL_TABLET | Freq: Every day | ORAL | 3 refills | Status: DC
Start: 2022-09-17 — End: 2023-07-17

## 2022-09-17 NOTE — Assessment & Plan Note (Signed)
Chronic back pain secondary to spinal stenosis. This affects her ability to stand or walk. She reports several near falls at home while trying to perform ADLs. She has rolling walking with a seat, hospital bed, and a shower chair at home. She has difficult ambulate within her home due to unsteady gait, back and leg pain.   With limited bilateral shoulder and fingers ROM, pain, and weakness in hand grip secondary to  osteoarthritis, she is unable to utilize a manual wheelchair.  Entered referral for home health PT/OT to eval for use of an electric wheelchair in her home.

## 2022-09-17 NOTE — Progress Notes (Signed)
Established Patient Visit  Patient: Sandra Brennan   DOB: [birth year]   77 y.o. Female  MRN: [id number] Visit Date: 09/17/2022  Subjective:    Chief Complaint  Patient presents with   Follow-up    From Hospital visit-  She keeps feeling unsteady and feels like falling. Unable to stand up for long periods of time     HPI Hospital admission 05/05 to 05/10 due to near syncope. It was determined, this was due to hypovolemia. There was no medication changes upon discharge. Tled She was discharged to Rehab facility x 2weeks due to generalized weakness. She was Discharged home on 08/01/22. She had another ED visit 08/10/22 due to severe headache and chest pain. Reviewed lab results and radiology reports. No acute finding noted. Morphine was administered. No med changes was made. Today she reports improved headache, but persistent intermittent lightheadedness. She is under the care of neurology for migraine headaches. Current use of tramadol and nurtec. She currently lives with her grandson, but Plans to move in with daughter or son for additional assistance. She denies need for ay in home assistant due to daily check by family and friends. She also has an emergency call system in bedroom and bathroom.  Hypertension BP not at goal for last 2months. She reports compliance with lisinopril 40mg , furosemide 20mg  and amlodipine 5mg  Associated with intermittent headache (migraine vs uncontrolled HYPERTENSION?) BP Readings from Last 3 Encounters:  09/17/22 (!) 160/90  08/10/22 (!) 175/86  08/06/22 120/70    Maintain lisinopril and furosemide dose Increase amlodipine to 10mg  in PM F/up in 76month   Spinal stenosis, lumbar region, without neurogenic claudication Chronic back pain secondary to spinal stenosis. This affects her ability to stand or walk. She reports several near falls at home while trying to perform ADLs. She has rolling walking with a seat, hospital bed, and a shower chair  at home. She has difficult ambulate within her home due to unsteady gait, back and leg pain.   With limited bilateral shoulder and fingers ROM, pain, and weakness in hand grip secondary to  osteoarthritis, she is unable to utilize a manual wheelchair.  Entered referral for home health PT/OT to eval for use of an electric wheelchair in her home.  Wt Readings from Last 3 Encounters:  09/17/22 269 lb (122 kg)  08/06/22 274 lb 6.4 oz (124.5 kg)  07/29/22 270 lb (122.5 kg)     Reviewed medical, surgical, and social history today  Medications: Outpatient Medications Prior to Visit  Medication Sig   acetaminophen (TYLENOL 8 HOUR ARTHRITIS PAIN) 650 MG CR tablet Take 1,300 mg by mouth every 8 (eight) hours as needed (for headaches).   albuterol (VENTOLIN HFA) 108 (90 Base) MCG/ACT inhaler INHALE 1 PUFF INTO THE LUNGS EVERY 6 (SIX) HOURS AS NEEDED FOR SHORTNESS OF BREATH OR WHEEZING.   allopurinol (ZYLOPRIM) 100 MG tablet TAKE 1 TABLET BY MOUTH EVERY DAY   aspirin EC 81 MG tablet Take 81 mg by mouth daily.   atorvastatin (LIPITOR) 10 MG tablet TAKE 1 TABLET BY MOUTH  DAILY AT 6 PM. (Patient taking differently: Take 10 mg by mouth daily at 6 PM.)   cetirizine (ZYRTEC) 10 MG tablet Take 10 mg by mouth in the morning.   cholecalciferol (VITAMIN D3) 25 MCG (1000 UNIT) tablet Take 1,000 Units by mouth daily.   escitalopram (LEXAPRO) 10 MG tablet TAKE 1 TABLET BY MOUTH EVERY DAY  furosemide (LASIX) 20 MG tablet TAKE 1/2 TABLET BY MOUTH DAILY   lisinopril (ZESTRIL) 40 MG tablet TAKE 1 TABLET BY MOUTH EVERY DAY (Patient taking differently: Take 40 mg by mouth in the morning.)   Multiple Vitamin (MULTIVITAMIN) tablet Take 1 tablet by mouth daily with breakfast.   nystatin (MYCOSTATIN/NYSTOP) powder Apply 1 Application topically 3 (three) times daily.   omeprazole (PRILOSEC) 20 MG capsule TAKE 1 CAPSULE BY MOUTH EVERY DAY (Patient taking differently: Take 20 mg by mouth daily before breakfast.)   OPCON-A  0.027-0.315 % SOLN Place 1 drop into both eyes 3 (three) times daily as needed (for itching).   pregabalin (LYRICA) 150 MG capsule Take 1 capsule (150 mg total) by mouth 2 (two) times daily.   Rimegepant Sulfate (NURTEC) 75 MG TBDP Take 1 tablet (75 mg total) by mouth as needed (take 1 at onset of headache, max is 75 in 24 hours). (Patient taking differently: Take 75 mg by mouth as needed (at the onset of a headache- max daily dose is 75 mg/24 hours).)   Semaglutide,0.25 or 0.5MG /DOS, (OZEMPIC, 0.25 OR 0.5 MG/DOSE,) 2 MG/3ML SOPN Inject 0.5 mg into the skin once a week. (Patient taking differently: Inject 0.5 mg into the skin every Tuesday.)   SYMBICORT 160-4.5 MCG/ACT inhaler INHALE 2 PUFFS INTO THE LUNGS IN THE MORNING AND AT BEDTIME. RINSE MOUTH AFTER EACH USE (Patient taking differently: Inhale 1 puff into the lungs in the morning and at bedtime. Rinse mouth after each use)   traMADol (ULTRAM) 50 MG tablet Take 1 tablet (50 mg total) by mouth 3 (three) times daily.   [DISCONTINUED] amLODipine (NORVASC) 5 MG tablet Take 1 tablet (5 mg total) by mouth every evening. (Patient taking differently: Take 5 mg by mouth at bedtime.)   No facility-administered medications prior to visit.   Reviewed past medical and social history.   ROS per HPI above      Objective:  BP (!) 160/90   Pulse 71   Temp 98 F (36.7 C)   Ht 4\' 11"  (1.499 m)   Wt 269 lb (122 kg)   SpO2 91%   BMI 54.33 kg/m      Physical Exam Cardiovascular:     Rate and Rhythm: Normal rate and regular rhythm.     Pulses: Normal pulses.     Heart sounds: Normal heart sounds.  Pulmonary:     Effort: Pulmonary effort is normal.     Breath sounds: Normal breath sounds.  Musculoskeletal:     Right lower leg: Edema present.     Left lower leg: Edema present.  Skin:    Findings: No erythema.  Neurological:     Mental Status: She is alert and oriented to person, place, and time.  Psychiatric:        Mood and Affect: Mood  normal.        Behavior: Behavior normal.     No results found for any visits on 09/17/22.    Assessment & Plan:    Problem List Items Addressed This Visit       Cardiovascular and Mediastinum   Hypertension - Primary (Chronic)    BP not at goal for last 2months. She reports compliance with lisinopril 40mg , furosemide 20mg  and amlodipine 5mg  Associated with intermittent headache (migraine vs uncontrolled HYPERTENSION?) BP Readings from Last 3 Encounters:  09/17/22 (!) 160/90  08/10/22 (!) 175/86  08/06/22 120/70    Maintain lisinopril and furosemide dose Increase amlodipine to 10mg  in PM F/up  in 71month       Relevant Medications   amLODipine (NORVASC) 10 MG tablet     Musculoskeletal and Integument   Spondylosis without myelopathy or radiculopathy, lumbar region   Relevant Orders   Ambulatory referral to Home Health   DME Wheelchair electric     Other   Spinal stenosis, lumbar region, without neurogenic claudication    Chronic back pain secondary to spinal stenosis. This affects her ability to stand or walk. She reports several near falls at home while trying to perform ADLs. She has rolling walking with a seat, hospital bed, and a shower chair at home. She has difficult ambulate within her home due to unsteady gait, back and leg pain.   With limited bilateral shoulder and fingers ROM, pain, and weakness in hand grip secondary to  osteoarthritis, she is unable to utilize a manual wheelchair.  Entered referral for home health PT/OT to eval for use of an electric wheelchair in her home.      Relevant Orders   Ambulatory referral to Home Health   DME Wheelchair electric   Other Visit Diagnoses     Generalized weakness       Relevant Orders   Ambulatory referral to Home Health   DME Wheelchair electric   At high risk for falls       Relevant Orders   Ambulatory referral to Home Health   DME Wheelchair electric      Return in about 4 weeks (around 10/15/2022)  for HTN.     Alysia Penna, NP

## 2022-09-17 NOTE — Assessment & Plan Note (Signed)
BP not at goal for last 2months. She reports compliance with lisinopril 40mg , furosemide 20mg  and amlodipine 5mg  Associated with intermittent headache (migraine vs uncontrolled HYPERTENSION?) BP Readings from Last 3 Encounters:  09/17/22 (!) 160/90  08/10/22 (!) 175/86  08/06/22 120/70    Maintain lisinopril and furosemide dose Increase amlodipine to 10mg  in PM F/up in 34month

## 2022-09-17 NOTE — Patient Instructions (Addendum)
Increase amlodipine to 10mg  in PM Maintain lisinopril and furosemide dose in AM. You will be contacted by home health F/up in 57month

## 2022-09-19 ENCOUNTER — Telehealth: Payer: Self-pay | Admitting: Dietician

## 2022-09-19 ENCOUNTER — Ambulatory Visit: Payer: Medicaid Other | Admitting: Dietician

## 2022-09-19 NOTE — Telephone Encounter (Signed)
Called patient re:  phone visit appointment. She was not available.  Her mailbox was full.  Oran Rein, RD, LDN, CDCES

## 2022-09-23 ENCOUNTER — Telehealth: Payer: Self-pay | Admitting: Nurse Practitioner

## 2022-09-23 ENCOUNTER — Ambulatory Visit: Payer: Self-pay

## 2022-09-23 DIAGNOSIS — G4733 Obstructive sleep apnea (adult) (pediatric): Secondary | ICD-10-CM | POA: Diagnosis not present

## 2022-09-23 DIAGNOSIS — Z9181 History of falling: Secondary | ICD-10-CM | POA: Diagnosis not present

## 2022-09-23 DIAGNOSIS — Z7982 Long term (current) use of aspirin: Secondary | ICD-10-CM | POA: Diagnosis not present

## 2022-09-23 DIAGNOSIS — I10 Essential (primary) hypertension: Secondary | ICD-10-CM | POA: Diagnosis not present

## 2022-09-23 DIAGNOSIS — M48061 Spinal stenosis, lumbar region without neurogenic claudication: Secondary | ICD-10-CM | POA: Diagnosis not present

## 2022-09-23 DIAGNOSIS — M47816 Spondylosis without myelopathy or radiculopathy, lumbar region: Secondary | ICD-10-CM | POA: Diagnosis not present

## 2022-09-23 NOTE — Telephone Encounter (Signed)
Called and left a VM with verbal orders, advised steve to call the office back with any questions or concerns.

## 2022-09-23 NOTE — Patient Outreach (Signed)
  Care Coordination   Follow Up Visit Note   09/23/2022 Name: SAVVY PEETERS MRN: [id number] DOB: 19-Aug-1945  Dub Mikes Morken is a 77 y.o. year old female who sees Nche, Bonna Gains, NP for primary care. I spoke with  Dub Mikes Lebron by phone today.  What matters to the patients health and wellness today?  The patient would like a life alert    Goals Addressed             This Visit's Progress    Care Coordination Activities       Care Coordination Interventions: Discussed the patient lives alone and is interested in obtaining a life alert system. Patient reports she has become unsteady on her feet and falls back often when attempting to stand. Patient uses a rollator in her home Performed chart review to note patients health plan does cover the cost of a personal emergency response system (PERS) Assisted the patient in contacting [facility]; patient ordered an "on the go" system which will work both inside and outside her home. The system will be delivered in the next 10 business days via [employer]         SDOH assessments and interventions completed:  No     Care Coordination Interventions:  Yes, provided   Interventions Today    Flowsheet Row Most Recent Value  Chronic Disease   Chronic disease during today's visit Hypertension (HTN), Other  [Fall Risk]  General Interventions   General Interventions Discussed/Reviewed General Interventions Discussed  Education Interventions   Education Provided Provided Education  Provided Verbal Education On Higher education careers adviser Interventions   Safety Discussed/Reviewed Home Safety, Fall Risk  Home Safety Assistive Devices        Follow up plan: Follow up call scheduled for 8/7    Encounter Outcome:  Pt. Visit Completed   Bevelyn Ngo, Kenard Gower, CDP Social Worker, Certified Dementia Practitioner [facility] Care Management  Care Coordination [phone]

## 2022-09-23 NOTE — Telephone Encounter (Signed)
Brett Canales from [employer] called and need verbal orders for the pt.  [phone]  Order: 1wk by 4

## 2022-09-23 NOTE — Patient Instructions (Signed)
Visit Information  Thank you for taking time to visit with me today. Please don't hesitate to contact me if I can be of assistance to you.   Following are the goals we discussed today:  - Speak with your daughters to decide who will be your emergency contact registered with your personal emergency response system   Our next appointment is by telephone on 8/7 at 9:30 am  Please call the care guide team at [phone] if you need to cancel or reschedule your appointment.   If you are experiencing a Mental Health or Behavioral Health Crisis or need someone to talk to, please call 1-800-273-TALK (toll free, 24 hour hotline) call 911  The patient verbalized understanding of instructions, educational materials, and care plan provided today and DECLINED offer to receive copy of patient instructions, educational materials, and care plan.   Bevelyn Ngo, BSW, CDP Social Worker, Certified Dementia Practitioner [facility] Care Management  Care Coordination [phone]

## 2022-09-24 DIAGNOSIS — I130 Hypertensive heart and chronic kidney disease with heart failure and stage 1 through stage 4 chronic kidney disease, or unspecified chronic kidney disease: Secondary | ICD-10-CM | POA: Diagnosis not present

## 2022-09-24 DIAGNOSIS — G894 Chronic pain syndrome: Secondary | ICD-10-CM | POA: Diagnosis not present

## 2022-09-24 DIAGNOSIS — M4802 Spinal stenosis, cervical region: Secondary | ICD-10-CM | POA: Diagnosis not present

## 2022-09-24 DIAGNOSIS — E1122 Type 2 diabetes mellitus with diabetic chronic kidney disease: Secondary | ICD-10-CM | POA: Diagnosis not present

## 2022-09-24 DIAGNOSIS — K5900 Constipation, unspecified: Secondary | ICD-10-CM | POA: Diagnosis not present

## 2022-09-24 DIAGNOSIS — M1030 Gout due to renal impairment, unspecified site: Secondary | ICD-10-CM | POA: Diagnosis not present

## 2022-09-24 DIAGNOSIS — Z7951 Long term (current) use of inhaled steroids: Secondary | ICD-10-CM | POA: Diagnosis not present

## 2022-09-24 DIAGNOSIS — D631 Anemia in chronic kidney disease: Secondary | ICD-10-CM | POA: Diagnosis not present

## 2022-09-24 DIAGNOSIS — K5730 Diverticulosis of large intestine without perforation or abscess without bleeding: Secondary | ICD-10-CM | POA: Diagnosis not present

## 2022-09-24 DIAGNOSIS — K8020 Calculus of gallbladder without cholecystitis without obstruction: Secondary | ICD-10-CM | POA: Diagnosis not present

## 2022-09-24 DIAGNOSIS — N1831 Chronic kidney disease, stage 3a: Secondary | ICD-10-CM | POA: Diagnosis not present

## 2022-09-24 DIAGNOSIS — I5030 Unspecified diastolic (congestive) heart failure: Secondary | ICD-10-CM | POA: Diagnosis not present

## 2022-09-24 DIAGNOSIS — M797 Fibromyalgia: Secondary | ICD-10-CM | POA: Diagnosis not present

## 2022-09-24 DIAGNOSIS — M19041 Primary osteoarthritis, right hand: Secondary | ICD-10-CM | POA: Diagnosis not present

## 2022-09-24 DIAGNOSIS — F32A Depression, unspecified: Secondary | ICD-10-CM | POA: Diagnosis not present

## 2022-09-24 DIAGNOSIS — G43909 Migraine, unspecified, not intractable, without status migrainosus: Secondary | ICD-10-CM | POA: Diagnosis not present

## 2022-09-24 DIAGNOSIS — E1142 Type 2 diabetes mellitus with diabetic polyneuropathy: Secondary | ICD-10-CM | POA: Diagnosis not present

## 2022-09-24 DIAGNOSIS — E559 Vitamin D deficiency, unspecified: Secondary | ICD-10-CM | POA: Diagnosis not present

## 2022-09-24 DIAGNOSIS — E785 Hyperlipidemia, unspecified: Secondary | ICD-10-CM | POA: Diagnosis not present

## 2022-09-24 DIAGNOSIS — J4489 Other specified chronic obstructive pulmonary disease: Secondary | ICD-10-CM | POA: Diagnosis not present

## 2022-09-24 DIAGNOSIS — M19042 Primary osteoarthritis, left hand: Secondary | ICD-10-CM | POA: Diagnosis not present

## 2022-09-24 DIAGNOSIS — G4733 Obstructive sleep apnea (adult) (pediatric): Secondary | ICD-10-CM | POA: Diagnosis not present

## 2022-09-25 DIAGNOSIS — M47816 Spondylosis without myelopathy or radiculopathy, lumbar region: Secondary | ICD-10-CM | POA: Diagnosis not present

## 2022-09-25 DIAGNOSIS — M48061 Spinal stenosis, lumbar region without neurogenic claudication: Secondary | ICD-10-CM | POA: Diagnosis not present

## 2022-09-25 DIAGNOSIS — I10 Essential (primary) hypertension: Secondary | ICD-10-CM | POA: Diagnosis not present

## 2022-09-25 DIAGNOSIS — Z9181 History of falling: Secondary | ICD-10-CM | POA: Diagnosis not present

## 2022-09-25 DIAGNOSIS — Z7982 Long term (current) use of aspirin: Secondary | ICD-10-CM | POA: Diagnosis not present

## 2022-09-26 ENCOUNTER — Telehealth: Payer: Self-pay | Admitting: Nurse Practitioner

## 2022-09-26 NOTE — Telephone Encounter (Signed)
[facility] [phone] Michelle  VO for OT [id number]

## 2022-09-26 NOTE — Telephone Encounter (Signed)
Called and left a voice message to proceed with order.

## 2022-09-30 DIAGNOSIS — M47816 Spondylosis without myelopathy or radiculopathy, lumbar region: Secondary | ICD-10-CM | POA: Diagnosis not present

## 2022-09-30 DIAGNOSIS — Z9181 History of falling: Secondary | ICD-10-CM | POA: Diagnosis not present

## 2022-09-30 DIAGNOSIS — Z7982 Long term (current) use of aspirin: Secondary | ICD-10-CM | POA: Diagnosis not present

## 2022-09-30 DIAGNOSIS — M48061 Spinal stenosis, lumbar region without neurogenic claudication: Secondary | ICD-10-CM | POA: Diagnosis not present

## 2022-09-30 DIAGNOSIS — I10 Essential (primary) hypertension: Secondary | ICD-10-CM | POA: Diagnosis not present

## 2022-10-01 ENCOUNTER — Telehealth: Payer: Self-pay | Admitting: Nurse Practitioner

## 2022-10-01 DIAGNOSIS — Z9181 History of falling: Secondary | ICD-10-CM | POA: Diagnosis not present

## 2022-10-01 DIAGNOSIS — M48061 Spinal stenosis, lumbar region without neurogenic claudication: Secondary | ICD-10-CM | POA: Diagnosis not present

## 2022-10-01 DIAGNOSIS — I10 Essential (primary) hypertension: Secondary | ICD-10-CM | POA: Diagnosis not present

## 2022-10-01 DIAGNOSIS — M47816 Spondylosis without myelopathy or radiculopathy, lumbar region: Secondary | ICD-10-CM | POA: Diagnosis not present

## 2022-10-01 DIAGNOSIS — Z7982 Long term (current) use of aspirin: Secondary | ICD-10-CM | POA: Diagnosis not present

## 2022-10-01 NOTE — Telephone Encounter (Signed)
   CLINICAL USE BELOW THIS LINE (use X to signify action taken)  ___ Form received and placed in providers office for signature. __x_ Form completed and faxed to [facility]  ___ Form completed & LVM to notify patient ready for pick up.  __x_ Charge sheet and copy of form in front office folder for office supervisor.

## 2022-10-01 NOTE — Telephone Encounter (Signed)
Patient dropped off document Home Health Certificate (Order ID  ), to be filled out by provider. Patient requested to send it back via Call Patient to pick up within 5-days. Document is located in providers tray at front office.Please advise at Mobile [phone] (mobile)   Came in by fax

## 2022-10-01 NOTE — Telephone Encounter (Signed)
   CLINICAL USE BELOW THIS LINE (use X to signify action taken)  _X__ Form received and placed in providers office for signature. ___ Form completed and faxed to [facility] Dept.  ___ Form completed & LVM to notify patient ready for pick up.  ___ Charge sheet and copy of form in front office folder for office supervisor.  

## 2022-10-03 ENCOUNTER — Ambulatory Visit: Payer: Self-pay

## 2022-10-03 NOTE — Patient Outreach (Signed)
  Care Coordination   Follow Up Visit Note   10/03/2022 Name: Sandra Brennan MRN: [id number] DOB: December 19, 1945  Sandra Brennan is a 77 y.o. year old female who sees Nche, Bonna Gains, NP for primary care. I spoke with  Sandra Brennan by phone today.  What matters to the patients health and wellness today?  Sandra Brennan's proactive approach to her health is good. She monitors her blood pressure daily, noting any changes. Despite experiencing some headaches and chest pain, which she has described as occasional, she is not deterred and plans to discuss these with her physician during her upcoming appointment on 10/15/22. Her conscious efforts to improve her diet and stay hydrated are working, but she has some diarrhea that she also needs to talk with her physician about.    Goals Addressed             This Visit's Progress    I want to manage my blood pressure         Patient Goals/Self Care Activities: -Patient/Caregiver will self-administer medications as prescribed as evidenced by self-report/primary caregiver report  -Patient/Caregiver will attend all scheduled provider appointments as evidenced by clinician review of documented attendance to scheduled appointments and patient/caregiver report -Patient/Caregiver will call provider office for new concerns or questions as evidenced by review of documented incoming telephone call notes and patient report -Patient/Caregiver will focus on medication adherence by taking medications as prescribed  -Calls provider office for new concerns, questions, or BP outside discussed parameters -Checks BP and records as discussed -Stay hydrated -Eat three meals a day BP Readings from Last 3 Encounters:  09/17/22 (!) 160/90  08/10/22 (!) 175/86  08/06/22 120/70          SDOH assessments and interventions completed:  No     Care Coordination Interventions:  Yes, provided   Follow up plan: Follow up call scheduled for 11/07/22  10 am     Encounter Outcome:  Pt. Visit Completed   Juanell Fairly RN, BSN, [facility] Care Coordinator [facility]   Phone: [phone]

## 2022-10-03 NOTE — Patient Instructions (Signed)
Visit Information  Thank you for taking time to visit with me today. Please don't hesitate to contact me if I can be of assistance to you.   Following are the goals we discussed today:   Goals Addressed             This Visit's Progress    I want to manage my blood pressure         Patient Goals/Self Care Activities: -Patient/Caregiver will self-administer medications as prescribed as evidenced by self-report/primary caregiver report  -Patient/Caregiver will attend all scheduled provider appointments as evidenced by clinician review of documented attendance to scheduled appointments and patient/caregiver report -Patient/Caregiver will call provider office for new concerns or questions as evidenced by review of documented incoming telephone call notes and patient report -Patient/Caregiver will focus on medication adherence by taking medications as prescribed  -Calls provider office for new concerns, questions, or BP outside discussed parameters -Checks BP and records as discussed -Stay hydrated -Eat three meals a day BP Readings from Last 3 Encounters:  09/17/22 (!) 160/90  08/10/22 (!) 175/86  08/06/22 120/70          Our next appointment is by telephone on 11/07/22 at 10 am  Please call the care guide team at [phone] if you need to cancel or reschedule your appointment.   If you are experiencing a Mental Health or Behavioral Health Crisis or need someone to talk to, please call 1-800-273-TALK (toll free, 24 hour hotline)  The patient verbalized understanding of instructions, educational materials, and care plan provided today.   Juanell Fairly RN, BSN, [facility] Care Coordinator [facility]   Phone: [phone]

## 2022-10-07 ENCOUNTER — Telehealth: Payer: Self-pay | Admitting: Nurse Practitioner

## 2022-10-07 ENCOUNTER — Other Ambulatory Visit: Payer: Self-pay | Admitting: Registered Nurse

## 2022-10-07 NOTE — Telephone Encounter (Signed)
Left Sandra Brennan's instruction on Voice message

## 2022-10-07 NOTE — Telephone Encounter (Signed)
See note

## 2022-10-08 ENCOUNTER — Ambulatory Visit: Payer: Self-pay

## 2022-10-08 DIAGNOSIS — I10 Essential (primary) hypertension: Secondary | ICD-10-CM | POA: Diagnosis not present

## 2022-10-08 DIAGNOSIS — Z7982 Long term (current) use of aspirin: Secondary | ICD-10-CM | POA: Diagnosis not present

## 2022-10-08 DIAGNOSIS — M48061 Spinal stenosis, lumbar region without neurogenic claudication: Secondary | ICD-10-CM | POA: Diagnosis not present

## 2022-10-08 DIAGNOSIS — Z9181 History of falling: Secondary | ICD-10-CM | POA: Diagnosis not present

## 2022-10-08 DIAGNOSIS — M47816 Spondylosis without myelopathy or radiculopathy, lumbar region: Secondary | ICD-10-CM | POA: Diagnosis not present

## 2022-10-08 NOTE — Patient Instructions (Signed)
Visit Information  Thank you for taking time to visit with me today. Please don't hesitate to contact me if I can be of assistance to you.   Following are the goals we discussed today:  - Have your family assist with next steps to activate your device - Contact your primary care provider as needed   If you are experiencing a Mental Health or Behavioral Health Crisis or need someone to talk to, please go to [facility] [address] [place] [phone]) call 911  The patient verbalized understanding of instructions, educational materials, and care plan provided today and DECLINED offer to receive copy of patient instructions, educational materials, and care plan.   No further follow up required: Please contact me as needed.  Bevelyn Ngo, BSW, CDP Social Worker, Certified Dementia Practitioner [facility] Care Management  Care Coordination [phone]

## 2022-10-08 NOTE — Patient Outreach (Signed)
  Care Coordination   Follow Up Visit Note   10/08/2022 Name: Sandra Brennan MRN: [id number] DOB: 02/27/1946  Sandra Brennan is a 77 y.o. year old female who sees Nche, Bonna Gains, NP for primary care. I spoke with  Sandra Brennan by phone today.  What matters to the patients health and wellness today?  Begin wearing personal emergency response system for safety.   Goals Addressed             This Visit's Progress    COMPLETED: Care Coordination Activities       Care Coordination Interventions: Confirmed patient received the PERS (personal emergency response system) from her health plan Discussed the patients family will assist with registering the device with [employer] Active listening performed as patient reflects on plans to participate in a memorial on Monday to remember her son who recently passed away Encouraged the patient to contact SW as needed         SDOH assessments and interventions completed:  No     Care Coordination Interventions:  Yes, provided   Interventions Today    Flowsheet Row Most Recent Value  Chronic Disease   Chronic disease during today's visit Hypertension (HTN)  General Interventions   General Interventions Discussed/Reviewed General Interventions Reviewed  Safety Interventions   Safety Discussed/Reviewed Home Safety  Home Safety Assistive Devices  [confirmed patient received PERS]        Follow up plan: No further intervention required.   Encounter Outcome:  Pt. Visit Completed   Bevelyn Ngo, BSW, CDP Social Worker, Certified Dementia Practitioner [facility] Care Management  Care Coordination [phone]

## 2022-10-09 NOTE — Telephone Encounter (Signed)
2nd attempt - left detailed message on voicemail.

## 2022-10-14 ENCOUNTER — Telehealth: Payer: Self-pay | Admitting: Nurse Practitioner

## 2022-10-14 DIAGNOSIS — M47816 Spondylosis without myelopathy or radiculopathy, lumbar region: Secondary | ICD-10-CM | POA: Diagnosis not present

## 2022-10-14 DIAGNOSIS — Z9181 History of falling: Secondary | ICD-10-CM | POA: Diagnosis not present

## 2022-10-14 DIAGNOSIS — M48061 Spinal stenosis, lumbar region without neurogenic claudication: Secondary | ICD-10-CM | POA: Diagnosis not present

## 2022-10-14 DIAGNOSIS — I10 Essential (primary) hypertension: Secondary | ICD-10-CM | POA: Diagnosis not present

## 2022-10-14 DIAGNOSIS — Z7982 Long term (current) use of aspirin: Secondary | ICD-10-CM | POA: Diagnosis not present

## 2022-10-14 NOTE — Telephone Encounter (Signed)
Jasmine from [facility] is calling to report pt falling flat on her face on Friday 10/10/22. She had trouble lifting her leg, this is what caused her to fall. If any further questions Jasmine at [phone].

## 2022-10-14 NOTE — Telephone Encounter (Signed)
Attempted numerous time to contact the patient and no success.  Called [facility] and they were not able to help with this situation.  Kerilynn has an appointment 8/14 so we will address this with her then.

## 2022-10-15 ENCOUNTER — Encounter: Payer: Self-pay | Admitting: Nurse Practitioner

## 2022-10-15 ENCOUNTER — Ambulatory Visit (INDEPENDENT_AMBULATORY_CARE_PROVIDER_SITE_OTHER): Payer: 59 | Admitting: Nurse Practitioner

## 2022-10-15 VITALS — BP 130/70 | HR 78 | Temp 98.5°F | Ht 59.0 in | Wt 271.6 lb

## 2022-10-15 DIAGNOSIS — I10 Essential (primary) hypertension: Secondary | ICD-10-CM

## 2022-10-15 DIAGNOSIS — W19XXXA Unspecified fall, initial encounter: Secondary | ICD-10-CM

## 2022-10-15 DIAGNOSIS — R296 Repeated falls: Secondary | ICD-10-CM | POA: Diagnosis not present

## 2022-10-15 DIAGNOSIS — R269 Unspecified abnormalities of gait and mobility: Secondary | ICD-10-CM | POA: Diagnosis not present

## 2022-10-15 DIAGNOSIS — M545 Low back pain, unspecified: Secondary | ICD-10-CM | POA: Diagnosis not present

## 2022-10-15 NOTE — Progress Notes (Signed)
Established Patient Visit  Patient: Sandra Brennan   DOB: 1945-06-18   77 y.o. Female  MRN: [id number] Visit Date: 10/15/2022  Subjective:    Chief Complaint  Patient presents with   Hypertension   Fall    Mobility evaluation for motorized wheelchair   Hypertension Pertinent negatives include no headaches.  Fall The accident occurred 5 to 7 days ago. The fall occurred while walking. She fell from a height of 3 to 5 ft. She landed on Hard floor. There was no blood loss. The point of impact was the face. The pain is present in the back, left shoulder and right shoulder. The pain is severe. The symptoms are aggravated by ambulation, movement and standing. Pertinent negatives include no abdominal pain, bowel incontinence, fever, headaches, hearing loss, loss of consciousness, nausea, numbness, tingling, visual change or vomiting. She has tried acetaminophen (tramadol, lyrica) for the symptoms.   Hypertension Improved BP control BP Readings from Last 3 Encounters:  10/15/22 130/70  09/17/22 (!) 160/90  08/10/22 (!) 175/86    Maintain current med doses F/up in 3months  Abnormality of gait and mobility Current use of walker with sit, but continues to have difficulty with ambulation due to LE weakness and pain. Recent fall at home due to leg fatigue when walking. Unable to use manual wheelchair due to chronic shoulder pain (8/10), limited ROM and weakness. Unable to use cane due to unsteady gait, Le weakness, and recent fall. She is a candidate for a scooter, but will progressive decline in mobility, decline in strength and frequent falls; this is not an adequate devise for long term use. A power wheelchair will be more appropriate for safe mobility, and decrease fall risk.   Wt Readings from Last 3 Encounters:  10/15/22 271 lb 9.6 oz (123.2 kg)  09/17/22 269 lb (122 kg)  08/06/22 274 lb 6.4 oz (124.5 kg)    Reviewed medical, surgical, and social history  today  Medications: Outpatient Medications Prior to Visit  Medication Sig   acetaminophen (TYLENOL 8 HOUR ARTHRITIS PAIN) 650 MG CR tablet Take 1,300 mg by mouth every 8 (eight) hours as needed (for headaches).   albuterol (VENTOLIN HFA) 108 (90 Base) MCG/ACT inhaler INHALE 1 PUFF INTO THE LUNGS EVERY 6 (SIX) HOURS AS NEEDED FOR SHORTNESS OF BREATH OR WHEEZING.   allopurinol (ZYLOPRIM) 100 MG tablet TAKE 1 TABLET BY MOUTH EVERY DAY   amLODipine (NORVASC) 10 MG tablet Take 1 tablet (10 mg total) by mouth at bedtime.   aspirin EC 81 MG tablet Take 81 mg by mouth daily.   atorvastatin (LIPITOR) 10 MG tablet TAKE 1 TABLET BY MOUTH  DAILY AT 6 PM. (Patient taking differently: Take 10 mg by mouth daily at 6 PM.)   cetirizine (ZYRTEC) 10 MG tablet Take 10 mg by mouth in the morning.   cholecalciferol (VITAMIN D3) 25 MCG (1000 UNIT) tablet Take 1,000 Units by mouth daily.   escitalopram (LEXAPRO) 10 MG tablet TAKE 1 TABLET BY MOUTH EVERY DAY   furosemide (LASIX) 20 MG tablet TAKE 1/2 TABLET BY MOUTH DAILY   lisinopril (ZESTRIL) 40 MG tablet TAKE 1 TABLET BY MOUTH EVERY DAY (Patient taking differently: Take 40 mg by mouth in the morning.)   Multiple Vitamin (MULTIVITAMIN) tablet Take 1 tablet by mouth daily with breakfast.   nystatin (MYCOSTATIN/NYSTOP) powder Apply 1 Application topically 3 (three) times daily.   omeprazole (PRILOSEC) 20 MG  capsule TAKE 1 CAPSULE BY MOUTH EVERY DAY (Patient taking differently: Take 20 mg by mouth daily before breakfast.)   OPCON-A 0.027-0.315 % SOLN Place 1 drop into both eyes 3 (three) times daily as needed (for itching).   pregabalin (LYRICA) 150 MG capsule Take 1 capsule (150 mg total) by mouth 2 (two) times daily.   Rimegepant Sulfate (NURTEC) 75 MG TBDP Take 1 tablet (75 mg total) by mouth as needed (take 1 at onset of headache, max is 75 in 24 hours). (Patient taking differently: Take 75 mg by mouth as needed (at the onset of a headache- max daily dose is 75 mg/24  hours).)   Semaglutide,0.25 or 0.5MG /DOS, (OZEMPIC, 0.25 OR 0.5 MG/DOSE,) 2 MG/3ML SOPN Inject 0.5 mg into the skin once a week. (Patient taking differently: Inject 0.5 mg into the skin every Tuesday.)   SYMBICORT 160-4.5 MCG/ACT inhaler INHALE 2 PUFFS INTO THE LUNGS IN THE MORNING AND AT BEDTIME. RINSE MOUTH AFTER EACH USE (Patient taking differently: Inhale 1 puff into the lungs in the morning and at bedtime. Rinse mouth after each use)   traMADol (ULTRAM) 50 MG tablet Take 1 tablet (50 mg total) by mouth 3 (three) times daily.   No facility-administered medications prior to visit.   Reviewed past medical and social history.   ROS per HPI above      Objective:  BP 130/70 (BP Location: Left Arm, Patient Position: Sitting, Cuff Size: Large)   Pulse 78   Temp 98.5 F (36.9 C) (Temporal)   Ht 4\' 11"  (1.499 m)   Wt 271 lb 9.6 oz (123.2 kg)   SpO2 91%   BMI 54.86 kg/m      Physical Exam Vitals and nursing note reviewed.  Cardiovascular:     Rate and Rhythm: Normal rate.  Musculoskeletal:     Right shoulder: Tenderness present. Decreased range of motion. Decreased strength.     Left shoulder: Tenderness present. Decreased range of motion. Decreased strength.     Right hand: No swelling. Normal range of motion. Normal strength.     Left hand: No swelling. Normal range of motion. Normal strength.     Cervical back: Normal range of motion and neck supple.     Right hip: Tenderness present. Decreased range of motion. Decreased strength.     Left hip: Tenderness present. Decreased range of motion. Decreased strength.     Right lower leg: Edema present.     Left lower leg: Edema present.     Comments: Hand grip 4/5 (bilateral) Bilateral LE (2/5)  Neurological:     Mental Status: She is alert and oriented to person, place, and time.     Motor: Weakness present.     Gait: Gait abnormal.  Psychiatric:        Mood and Affect: Mood normal.        Behavior: Behavior normal.         Thought Content: Thought content normal.     No results found for any visits on 10/15/22.    Assessment & Plan:    Problem List Items Addressed This Visit     Abnormality of gait and mobility - Primary    Current use of walker with sit, but continues to have difficulty with ambulation due to LE weakness and pain. Recent fall at home due to leg fatigue when walking. Unable to use manual wheelchair due to chronic shoulder pain (8/10), limited ROM and weakness. Unable to use cane due to unsteady gait, Le  weakness, and recent fall. She is a candidate for a scooter, but will progressive decline in mobility, decline in strength and frequent falls; this is not an adequate devise for long term use. A power wheelchair will be more appropriate for safe mobility, and decrease fall risk.      Hypertension (Chronic)    Improved BP control BP Readings from Last 3 Encounters:  10/15/22 130/70  09/17/22 (!) 160/90  08/10/22 (!) 175/86    Maintain current med doses F/up in 3months      Other Visit Diagnoses     Fall, initial encounter       Relevant Orders   DG Lumbar Spine Complete   Acute bilateral low back pain without sciatica       Relevant Orders   DG Lumbar Spine Complete      Return in about 3 months (around 01/15/2023) for HTN, hyperlipidemia (fasting).     Alysia Penna, NP

## 2022-10-15 NOTE — Patient Instructions (Signed)
Maintain current meds Got o [address] for back x-ray

## 2022-10-15 NOTE — Assessment & Plan Note (Addendum)
Current use of walker with sit, but continues to have difficulty with ambulation due to LE weakness and pain. Recent fall at home due to leg fatigue when walking. Unable to use manual wheelchair due to bilateral chronic shoulder pain (8/10), limited ROM and weakness. Unable to use cane due to unsteady gait, Le weakness, and recent fall. She is a candidate for a scooter, but will progressive decline in mobility, decline in strength and frequent falls; this is not an adequate devise for long term use. A power wheelchair will be more appropriate for safe mobility, and decrease fall risk.

## 2022-10-15 NOTE — Assessment & Plan Note (Signed)
Improved BP control BP Readings from Last 3 Encounters:  10/15/22 130/70  09/17/22 (!) 160/90  08/10/22 (!) 175/86    Maintain current med doses F/up in 3months

## 2022-10-16 ENCOUNTER — Ambulatory Visit (INDEPENDENT_AMBULATORY_CARE_PROVIDER_SITE_OTHER)
Admission: RE | Admit: 2022-10-16 | Discharge: 2022-10-16 | Disposition: A | Discharge status: Home / Self Care | Payer: 59 | Source: Ambulatory Visit | Attending: Nurse Practitioner | Admitting: Nurse Practitioner

## 2022-10-16 DIAGNOSIS — W19XXXA Unspecified fall, initial encounter: Secondary | ICD-10-CM | POA: Diagnosis not present

## 2022-10-16 DIAGNOSIS — M47816 Spondylosis without myelopathy or radiculopathy, lumbar region: Secondary | ICD-10-CM | POA: Diagnosis not present

## 2022-10-16 DIAGNOSIS — M545 Low back pain, unspecified: Secondary | ICD-10-CM

## 2022-10-16 DIAGNOSIS — M419 Scoliosis, unspecified: Secondary | ICD-10-CM | POA: Diagnosis not present

## 2022-10-16 DIAGNOSIS — Z043 Encounter for examination and observation following other accident: Secondary | ICD-10-CM | POA: Diagnosis not present

## 2022-10-16 DIAGNOSIS — M549 Dorsalgia, unspecified: Secondary | ICD-10-CM | POA: Diagnosis not present

## 2022-10-16 NOTE — Progress Notes (Signed)
First attempt to call patient. Unable to leave a message, mailbox is full

## 2022-10-18 ENCOUNTER — Other Ambulatory Visit: Payer: Self-pay | Admitting: Nurse Practitioner

## 2022-10-18 DIAGNOSIS — E1142 Type 2 diabetes mellitus with diabetic polyneuropathy: Secondary | ICD-10-CM

## 2022-10-21 DIAGNOSIS — Z7982 Long term (current) use of aspirin: Secondary | ICD-10-CM | POA: Diagnosis not present

## 2022-10-21 DIAGNOSIS — I10 Essential (primary) hypertension: Secondary | ICD-10-CM | POA: Diagnosis not present

## 2022-10-21 DIAGNOSIS — M47816 Spondylosis without myelopathy or radiculopathy, lumbar region: Secondary | ICD-10-CM | POA: Diagnosis not present

## 2022-10-21 DIAGNOSIS — Z9181 History of falling: Secondary | ICD-10-CM | POA: Diagnosis not present

## 2022-10-21 DIAGNOSIS — M48061 Spinal stenosis, lumbar region without neurogenic claudication: Secondary | ICD-10-CM | POA: Diagnosis not present

## 2022-10-23 DIAGNOSIS — Z9181 History of falling: Secondary | ICD-10-CM | POA: Diagnosis not present

## 2022-10-23 DIAGNOSIS — M47816 Spondylosis without myelopathy or radiculopathy, lumbar region: Secondary | ICD-10-CM | POA: Diagnosis not present

## 2022-10-23 DIAGNOSIS — M48061 Spinal stenosis, lumbar region without neurogenic claudication: Secondary | ICD-10-CM | POA: Diagnosis not present

## 2022-10-23 DIAGNOSIS — Z7982 Long term (current) use of aspirin: Secondary | ICD-10-CM | POA: Diagnosis not present

## 2022-10-23 DIAGNOSIS — I10 Essential (primary) hypertension: Secondary | ICD-10-CM | POA: Diagnosis not present

## 2022-10-24 DIAGNOSIS — G4733 Obstructive sleep apnea (adult) (pediatric): Secondary | ICD-10-CM | POA: Diagnosis not present

## 2022-10-25 DIAGNOSIS — G4733 Obstructive sleep apnea (adult) (pediatric): Secondary | ICD-10-CM | POA: Diagnosis not present

## 2022-10-28 ENCOUNTER — Telehealth: Payer: Self-pay | Admitting: *Deleted

## 2022-10-28 NOTE — Telephone Encounter (Signed)
Mrs Amadi called and is requesting a refill on her tramadol. She has not been seen since November of last year (by Riley Lam) so I have transferred her to appointments to schedule with Riley Lam.

## 2022-10-29 ENCOUNTER — Other Ambulatory Visit: Payer: Self-pay | Admitting: Nurse Practitioner

## 2022-10-29 DIAGNOSIS — I10 Essential (primary) hypertension: Secondary | ICD-10-CM

## 2022-10-30 DIAGNOSIS — J449 Chronic obstructive pulmonary disease, unspecified: Secondary | ICD-10-CM | POA: Diagnosis not present

## 2022-10-30 DIAGNOSIS — M6281 Muscle weakness (generalized): Secondary | ICD-10-CM | POA: Diagnosis not present

## 2022-10-30 DIAGNOSIS — I5032 Chronic diastolic (congestive) heart failure: Secondary | ICD-10-CM | POA: Diagnosis not present

## 2022-10-30 DIAGNOSIS — M1A00X0 Idiopathic chronic gout, unspecified site, without tophus (tophi): Secondary | ICD-10-CM | POA: Diagnosis not present

## 2022-10-30 DIAGNOSIS — R2689 Other abnormalities of gait and mobility: Secondary | ICD-10-CM | POA: Diagnosis not present

## 2022-11-04 ENCOUNTER — Encounter: Payer: 59 | Admitting: Registered Nurse

## 2022-11-05 ENCOUNTER — Encounter: Payer: 59 | Attending: Registered Nurse | Admitting: Registered Nurse

## 2022-11-05 ENCOUNTER — Encounter: Payer: Self-pay | Admitting: Registered Nurse

## 2022-11-05 VITALS — BP 118/74 | HR 75 | Ht 59.0 in | Wt 270.0 lb

## 2022-11-05 DIAGNOSIS — M48061 Spinal stenosis, lumbar region without neurogenic claudication: Secondary | ICD-10-CM | POA: Diagnosis not present

## 2022-11-05 DIAGNOSIS — W19XXXD Unspecified fall, subsequent encounter: Secondary | ICD-10-CM | POA: Insufficient documentation

## 2022-11-05 DIAGNOSIS — G8929 Other chronic pain: Secondary | ICD-10-CM

## 2022-11-05 DIAGNOSIS — M542 Cervicalgia: Secondary | ICD-10-CM | POA: Diagnosis not present

## 2022-11-05 DIAGNOSIS — Y92009 Unspecified place in unspecified non-institutional (private) residence as the place of occurrence of the external cause: Secondary | ICD-10-CM | POA: Insufficient documentation

## 2022-11-05 DIAGNOSIS — M797 Fibromyalgia: Secondary | ICD-10-CM | POA: Insufficient documentation

## 2022-11-05 DIAGNOSIS — M25511 Pain in right shoulder: Secondary | ICD-10-CM | POA: Insufficient documentation

## 2022-11-05 DIAGNOSIS — G894 Chronic pain syndrome: Secondary | ICD-10-CM | POA: Insufficient documentation

## 2022-11-05 DIAGNOSIS — M25512 Pain in left shoulder: Secondary | ICD-10-CM | POA: Diagnosis not present

## 2022-11-05 DIAGNOSIS — M546 Pain in thoracic spine: Secondary | ICD-10-CM | POA: Diagnosis not present

## 2022-11-05 DIAGNOSIS — M7061 Trochanteric bursitis, right hip: Secondary | ICD-10-CM | POA: Insufficient documentation

## 2022-11-05 DIAGNOSIS — M5416 Radiculopathy, lumbar region: Secondary | ICD-10-CM | POA: Insufficient documentation

## 2022-11-05 DIAGNOSIS — M7062 Trochanteric bursitis, left hip: Secondary | ICD-10-CM | POA: Insufficient documentation

## 2022-11-05 DIAGNOSIS — Z5181 Encounter for therapeutic drug level monitoring: Secondary | ICD-10-CM | POA: Diagnosis not present

## 2022-11-05 DIAGNOSIS — Z79899 Other long term (current) drug therapy: Secondary | ICD-10-CM | POA: Diagnosis not present

## 2022-11-05 DIAGNOSIS — G5793 Unspecified mononeuropathy of bilateral lower limbs: Secondary | ICD-10-CM

## 2022-11-05 NOTE — Progress Notes (Signed)
Subjective:    Patient ID: Sandra Brennan, female    DOB: 08/04/1945, 77 y.o.   MRN: [id number]  HPI: Sandra Brennan is a 77 y.o. female who returns for follow up appointment for chronic pain and medication refill. She states her pain is located in her neck, bilateral shoulders, mid- lower back pain radiating into her bilateral lower extremities and bilateral feet pain with tingling and burning. She rates her pain 9.Her  current exercise regime is walking with her walker.  [name] reports a week ago she fell she was walking to the bathroom at work and fell forward, her co-workers helped her up. She F/U with her PCP.    She also reports her son had COVID +, she tested for COVID and it was negative.    Oral Swab was Performed   Pain Inventory Average Pain 10 Pain Right Now 9 My pain is constant, sharp, and aching  In the last 24 hours, has pain interfered with the following? General activity 5 Relation with others 5 Enjoyment of life 5 What TIME of day is your pain at its worst? daytime and night Sleep (in general) Poor  Pain is worse with: walking and standing Pain improves with: rest and medication Relief from Meds: 5  Family History  Problem Relation Age of Onset   Arthritis Mother    Arthritis Father    Colon cancer Father    Diabetes Sister    Arthritis Sister    Diabetes Maternal Grandmother    Arthritis Maternal Grandmother    Diabetes Maternal Grandfather    Arthritis Maternal Grandfather    Migraines Neg Hx    Sleep apnea Neg Hx    Social History   Socioeconomic History   Marital status: Widowed    Spouse name: Not on file   Number of children: 5   Years of education: PhD   Highest education level: Not on file  Occupational History   Occupation: Retired  Tobacco Use   Smoking status: Former    Current packs/day: 1.00    Types: Cigarettes   Smokeless tobacco: Never   Tobacco comments:    08/03/2012 "quit 30 years ago"  Vaping Use   Vaping status:  Never Used  Substance and Sexual Activity   Alcohol use: No    Alcohol/week: 0.0 standard drinks of alcohol    Comment: 08/03/2012 "used to be a beeralcoholic"; stopped ~ 30 yr ago"   Drug use: No   Sexual activity: Never  Other Topics Concern   Not on file  Social History Narrative   Lives at home with her mother and caregiver.- mother past away 01/2021 and now lives alone.   Occasional use of caffeine.   Right-handed.   Social Determinants of Health   Financial Resource Strain: Low Risk  (11/26/2021)   Overall Financial Resource Strain (CARDIA)    Difficulty of Paying Living Expenses: Not hard at all  Food Insecurity: No Food Insecurity (08/07/2022)   Hunger Vital Sign    Worried About Running Out of Food in the Last Year: Never true    Ran Out of Food in the Last Year: Never true  Transportation Needs: No Transportation Needs (08/07/2022)   PRAPARE - Administrator, Civil Service (Medical): No    Lack of Transportation (Non-Medical): No  Physical Activity: Inactive (09/17/2022)   Exercise Vital Sign    Days of Exercise per Week: 0 days    Minutes of Exercise per Session: 0  min  Stress: No Stress Concern Present (11/26/2021)   [employer] of Occupational Health - Occupational Stress Questionnaire    Feeling of Stress : Not at all  Social Connections: Moderately Integrated (09/17/2022)   Social Connection and Isolation Panel [NHANES]    Frequency of Communication with Friends and Family: More than three times a week    Frequency of Social Gatherings with Friends and Family: More than three times a week    Attends Religious Services: More than 4 times per year    Active Member of [employer] or Organizations: Yes    Attends Banker Meetings: More than 4 times per year    Marital Status: Widowed   Past Surgical History:  Procedure Laterality Date   ABDOMINAL HYSTERECTOMY  1990's   GANGLION CYST EXCISION Right 1980's   "wrist" (26-Aug-2012)   JOINT REPLACEMENT      KNEE LIGAMENT RECONSTRUCTION Right 1980's   TONSILLECTOMY  ~ 1954   TOTAL HIP ARTHROPLASTY Left 2012/08/26   TOTAL HIP ARTHROPLASTY Left 08/26/12   Procedure: TOTAL HIP ARTHROPLASTY;  Surgeon: [name], MD;  Location: [facility] OR;  Service: Orthopedics;  Laterality: Left;  Left Total Hip Arthroplasty   TOTAL HIP ARTHROPLASTY Left 08/28/2012   Procedure: Irrigation and Debridement hip ;  Surgeon: Kathryne Hitch, MD;  Location: [facility] OR;  Service: Orthopedics;  Laterality: Left;   TOTAL KNEE ARTHROPLASTY Left 2001   TOTAL KNEE ARTHROPLASTY Right 2004   Past Surgical History:  Procedure Laterality Date   ABDOMINAL HYSTERECTOMY  1990's   GANGLION CYST EXCISION Right 1980's   "wrist" (Aug 26, 2012)   JOINT REPLACEMENT     KNEE LIGAMENT RECONSTRUCTION Right 1980's   TONSILLECTOMY  ~ 1954   TOTAL HIP ARTHROPLASTY Left 08-26-12   TOTAL HIP ARTHROPLASTY Left 08-26-2012   Procedure: TOTAL HIP ARTHROPLASTY;  Surgeon: [name], MD;  Location: [facility] OR;  Service: Orthopedics;  Laterality: Left;  Left Total Hip Arthroplasty   TOTAL HIP ARTHROPLASTY Left 08/28/2012   Procedure: Irrigation and Debridement hip ;  Surgeon: Kathryne Hitch, MD;  Location: [facility] OR;  Service: Orthopedics;  Laterality: Left;   TOTAL KNEE ARTHROPLASTY Left 2001   TOTAL KNEE ARTHROPLASTY Right 2004   Past Medical History:  Diagnosis Date   Acute nontraumatic kidney injury (HCC) 05/05/2013   Acute posthemorrhagic anemia 09/06/2012   Anemia    Arthritis    "plenty" (2012/08/26)   Asthma    Chest pain 03/30/2016   Chronic lower back pain    "they say I need a whole new spinal column" (2012-08-26)   COPD (chronic obstructive pulmonary disease) (HCC)    Degenerative arthritis    "all over" (August 26, 2012)   Fx ankle    GERD (gastroesophageal reflux disease)    Gout 05/05/2013   Hypertension    Migraines    "used to; totally stopped when I quit drinking 30 yr ago" (2012/08/26)   Myalgia and myositis    Myocardial  infarction [facility]) 1992   August 26, 2012 "mild MI when son died "   Obesity    Osteoporosis    "all over" (Aug 26, 2012)   Shortness of breath    when stressed.   Sleep apnea    "never did fix my machine; haven't used one for 5 years; I've lost 116# since then; no problems now" (08/26/12)   Syncope 03/30/2016   Type II diabetes mellitus (HCC)    "borderline; don't test; take Metformin" (2012/08/26)   Ht 4\' 11"  (1.499 m)  BMI 54.86 kg/m   Opioid Risk Score:   Fall Risk Score:  `1  Depression screen [facility] 2/9     09/17/2022   10:49 AM 08/06/2022   11:24 AM 06/04/2022   10:04 AM 06/04/2022   10:02 AM 01/15/2022   10:44 AM 01/01/2022   11:13 AM 11/26/2021    1:51 PM  Depression screen PHQ 2/9  Decreased Interest 2 0 2 2 1  0 0  Down, Depressed, Hopeless 1 0 1 1 1  0 0  PHQ - 2 Score 3 0 3 3 2  0 0  Altered sleeping 3  3   0   Tired, decreased energy 3  2   0   Change in appetite 3  3   3    Feeling bad or failure about yourself  0  0   0   Trouble concentrating 1  0   0   Moving slowly or fidgety/restless 0  0   0   Suicidal thoughts 0  0   0   PHQ-9 Score 13  11   3    Difficult doing work/chores Somewhat difficult  Somewhat difficult   Not difficult at all       Review of Systems  Musculoskeletal:  Positive for back pain.       B/L shoulder knee foot hip pain buttock Rt hand pain  All other systems reviewed and are negative.      Objective:   Physical Exam Vitals and nursing note reviewed.  Constitutional:      Appearance: Normal appearance.  Cardiovascular:     Rate and Rhythm: Normal rate and regular rhythm.     Pulses: Normal pulses.     Heart sounds: Normal heart sounds.  Pulmonary:     Effort: Pulmonary effort is normal.     Breath sounds: Normal breath sounds.  Musculoskeletal:     Cervical back: Normal range of motion and neck supple.     Comments: Normal Muscle Bulk and Muscle Testing Reveals:  Upper Extremities: Full ROM and Muscle Strength 5/5 Bilateral AC Joint  Tenderness  Thoracic Hypersensitivity Lumbar Paraspinal Tenderness Lower Extremities: Decreased ROM and Muscle Strength 5/5 Bilateral Lower Extremities Flexion Produces Pain into her Bilateral Lower Extremities Bilateral Lower Extremities Flexion Produces Pain into Right Patella Arises from Table Slowly Antalgic  Gait     Skin:    General: Skin is warm and dry.  Neurological:     Mental Status: She is alert and oriented to person, place, and time.  Psychiatric:        Mood and Affect: Mood normal.        Behavior: Behavior normal.         Assessment & Plan:  1. Chronic  Bilateral Shoulder Pain: Continue HEP as Tolerated.  Continue to Monitor. 11/05/2022 2. Lumbar Spinal Stenosis: Continue HEP as Tolerated . Continue to Monitor. 11/05/2022 3. Fibromyalgia: Continue HEP as Tolerated. Continue to Monitor. 11/05/2022 4. Bilateral Greater Trochanter Bursitis: Continue to alternate Ice and Heat Therapy. Continue to Monitor. 11/05/2022. 5. Bilateral Lower Extremities with Neuropathic Pain: Continue Pregablin. Continue to Monitor. 11/05/2022 6.. Chronic Pain Syndrome: Continue :: Tramadol 50 mg one tablet QID as needed for pain.  We will continue the opioid monitoring program, this consists of regular clinic visits, examinations, urine drug screen, pill counts as well as use of [place] Controlled Substance Reporting system. A 12 month History has been reviewed on the [place] Controlled Substance Reporting System on 11/05/2022.  Continue  to monitor.  7. Chronic Bilateral Thoracic Pain : Continue HEP as Tolerated. Continue to Monitor.  8. Cervicalgia: Continue HEP as Tolerated. Continue to Monitor.   F/U in  2 months

## 2022-11-06 ENCOUNTER — Other Ambulatory Visit: Payer: Self-pay | Admitting: Nurse Practitioner

## 2022-11-06 ENCOUNTER — Ambulatory Visit: Payer: 59 | Admitting: Nurse Practitioner

## 2022-11-06 DIAGNOSIS — K2100 Gastro-esophageal reflux disease with esophagitis, without bleeding: Secondary | ICD-10-CM

## 2022-11-07 ENCOUNTER — Telehealth: Payer: Self-pay

## 2022-11-07 NOTE — Patient Outreach (Signed)
  Care Coordination   11/07/2022 Name: Sandra Brennan MRN: [id number] DOB: 07-05-1945   Care Coordination Outreach Attempts:  An unsuccessful telephone outreach was attempted today to offer the patient information about available care coordination services.  Follow Up Plan:  Additional outreach attempts will be made to offer the patient care coordination information and services.   Encounter Outcome:  No Answer   Care Coordination Interventions:  No, not indicated    Juanell Fairly RN, BSN, [facility] [employer] Phone: [phone]

## 2022-11-08 LAB — DRUG TOX ALC METAB W/CON, ORAL FLD: Alcohol Metabolite: NEGATIVE ng/mL (ref <25)

## 2022-11-08 LAB — DRUG TOX MONITOR 1 W/CONF, ORAL FLD

## 2022-11-08 NOTE — Progress Notes (Signed)
This encounter was created in error - please disregard.

## 2022-11-13 ENCOUNTER — Ambulatory Visit: Payer: Self-pay

## 2022-11-13 NOTE — Patient Instructions (Signed)
Visit Information  Thank you for taking time to visit with me today. Please don't hesitate to contact me if I can be of assistance to you.   Following are the goals we discussed today:   Goals Addressed             This Visit's Progress    I want to manage my blood pressure and fall         Patient Goals/Self Care Activities: -Patient/Caregiver will self-administer medications as prescribed as evidenced by self-report/primary caregiver report  -Patient/Caregiver will attend all scheduled provider appointments as evidenced by clinician review of documented attendance to scheduled appointments and patient/caregiver report -Patient/Caregiver will call provider office for new concerns or questions as evidenced by review of documented incoming telephone call notes and patient report -Patient/Caregiver will focus on medication adherence by taking medications as prescribed  -Calls provider office for new concerns, questions, or BP outside discussed parameters -Checks BP and records as discussed -Stay hydrated -Eat three meals a day BP Readings from Last 3 Encounters:  11/05/22 118/74  10/15/22 130/70  09/17/22 (!) 160/90  -Utilize (assistive device) appropriately with all ambulation -De-clutter walkways -Utilize home lighting for dim lit areas Keep your medical alert necklace           Our next appointment is by telephone on 12/18/22 at 1130 am  Please call the care guide team at [phone] if you need to cancel or reschedule your appointment.   If you are experiencing a Mental Health or Behavioral Health Crisis or need someone to talk to, please call 1-800-273-TALK (toll free, 24 hour hotline)  The patient verbalized understanding of instructions, educational materials, and care plan provided today.    Juanell Fairly RN, BSN, [facility] [employer] Phone: [phone]

## 2022-11-13 NOTE — Patient Outreach (Signed)
  Care Coordination   Follow Up Visit Note   11/13/2022 Name: Sandra Brennan MRN: [id number] DOB: 08/08/1945  Sandra Brennan is a 77 y.o. year old female who sees Nche, Bonna Gains, NP for primary care. I spoke with  Sandra Brennan by phone today.  What matters to the patients health and wellness today?   Sandra Brennan has indicated that she experienced a fall, resulting in a direct impact close to her facial area and as a defensive reflex of extending the hand to prevent facial injury. Her right arm sustained abrasions, and persistent discomfort. She encountered difficulty in obtaining a refill for her pain medication. She is waiating for her drug screen to come back now. She possesses a medical alert necklace. The recorded blood pressure stands at 140/70, and she confirmed compliance with the prescribed medication regimen.    Goals Addressed             This Visit's Progress    I want to manage my blood pressure and fall         Patient Goals/Self Care Activities: -Patient/Caregiver will self-administer medications as prescribed as evidenced by self-report/primary caregiver report  -Patient/Caregiver will attend all scheduled provider appointments as evidenced by clinician review of documented attendance to scheduled appointments and patient/caregiver report -Patient/Caregiver will call provider office for new concerns or questions as evidenced by review of documented incoming telephone call notes and patient report -Patient/Caregiver will focus on medication adherence by taking medications as prescribed  -Calls provider office for new concerns, questions, or BP outside discussed parameters -Checks BP and records as discussed -Stay hydrated -Eat three meals a day BP Readings from Last 3 Encounters:  11/05/22 118/74  10/15/22 130/70  09/17/22 (!) 160/90  -Utilize (assistive device) appropriately with all ambulation -De-clutter walkways -Utilize home lighting for dim lit  areas Keep your medical alert necklace           SDOH assessments and interventions completed:  No     Care Coordination Interventions:  Yes, provided {THN Tip this will not be part of the note when signed-REQUIRED REPORT FIELD DO NOT DELETE (Optional):27901  Interventions Today    Flowsheet Row Most Recent Value  Chronic Disease   Chronic disease during today's visit Hypertension (HTN), Other  [fall]  General Interventions   General Interventions Discussed/Reviewed General Interventions Discussed, General Interventions Reviewed  Pharmacy Interventions   Pharmacy Dicussed/Reviewed Pharmacy Topics Discussed  Safety Interventions   Safety Discussed/Reviewed Safety Reviewed, Fall Risk        Follow up plan: Follow up call scheduled for 12/18/22 1130 am    Encounter Outcome:  Patient Visit Completed   Juanell Fairly RN, BSN, [facility]   Care Coordinator Phone: [phone]

## 2022-11-24 DIAGNOSIS — G4733 Obstructive sleep apnea (adult) (pediatric): Secondary | ICD-10-CM | POA: Diagnosis not present

## 2022-11-28 ENCOUNTER — Ambulatory Visit (INDEPENDENT_AMBULATORY_CARE_PROVIDER_SITE_OTHER): Payer: 59

## 2022-11-28 ENCOUNTER — Telehealth: Payer: Self-pay

## 2022-11-28 DIAGNOSIS — Z0000 Encounter for general adult medical examination without abnormal findings: Secondary | ICD-10-CM | POA: Diagnosis not present

## 2022-11-28 NOTE — Telephone Encounter (Signed)
Telephone encounter was:  Unsuccessful.  11/28/2022 Name: Sandra Brennan MRN: [id number] DOB: [birth year]  Unsuccessful outbound call made today to assist with:  Transportation Needs   Outreach Attempt:  1st Attempt  Unable to leave message at mobile number [phone] voicemail full, no answer at home number [phone].  Moana Munford [facility]  [facility], [facility] Guide Direct Dial: [phone]  Website: [email or web address]

## 2022-11-28 NOTE — Progress Notes (Signed)
Subjective:   Sandra Brennan is a 77 y.o. female who presents for Medicare Annual (Subsequent) preventive examination.  Visit Complete: Virtual  I connected with  Catelyn Friel Sledge on 11/28/22 by a audio enabled telemedicine application and verified that I am speaking with the correct person using two identifiers.  Patient Location: Home  Provider Location: Office/Clinic  I discussed the limitations of evaluation and management by telemedicine. The patient expressed understanding and agreed to proceed.  Because this visit was a virtual/telehealth visit, some criteria may be missing or patient reported. Any vitals not documented were not able to be obtained and vitals that have been documented are patient reported.   Cardiac Risk Factors include: advanced age (>10men, >63 women);diabetes mellitus;dyslipidemia;hypertension     Objective:    Today's Vitals   11/28/22 1002  PainSc: 10-Worst pain ever   There is no height or weight on file to calculate BMI.     11/28/2022   10:17 AM 08/10/2022   10:53 AM 07/06/2022    2:19 PM 11/26/2021    1:52 PM 10/15/2021   10:24 AM 08/29/2021    9:27 AM 08/25/2021    7:52 AM  Advanced Directives  Does Patient Have a Medical Advance Directive? No No No Yes Yes  No  Type of Aeronautical engineer of [place];Living will Healthcare Power of [place];Living will    Copy of Healthcare Power of Attorney in Chart?    No - copy requested     Would patient like information on creating a medical advance directive?   No - Patient declined   Yes (ED - Information included in AVS) No - Patient declined    Current Medications (verified) Outpatient Encounter Medications as of 11/28/2022  Medication Sig   acetaminophen (TYLENOL 8 HOUR ARTHRITIS PAIN) 650 MG CR tablet Take 1,300 mg by mouth every 8 (eight) hours as needed (for headaches).   albuterol (VENTOLIN HFA) 108 (90 Base) MCG/ACT inhaler INHALE 1 PUFF INTO THE LUNGS EVERY 6 (SIX) HOURS AS  NEEDED FOR SHORTNESS OF BREATH OR WHEEZING.   allopurinol (ZYLOPRIM) 100 MG tablet TAKE 1 TABLET BY MOUTH EVERY DAY   amLODipine (NORVASC) 10 MG tablet Take 1 tablet (10 mg total) by mouth at bedtime.   aspirin EC 81 MG tablet Take 81 mg by mouth daily.   atorvastatin (LIPITOR) 10 MG tablet TAKE 1 TABLET BY MOUTH  DAILY AT 6 PM. (Patient taking differently: Take 10 mg by mouth daily at 6 PM.)   cetirizine (ZYRTEC) 10 MG tablet Take 10 mg by mouth in the morning.   cholecalciferol (VITAMIN D3) 25 MCG (1000 UNIT) tablet Take 1,000 Units by mouth daily.   escitalopram (LEXAPRO) 10 MG tablet TAKE 1 TABLET BY MOUTH EVERY DAY   furosemide (LASIX) 20 MG tablet TAKE 1/2 TABLET BY MOUTH DAILY   lisinopril (ZESTRIL) 40 MG tablet TAKE 1 TABLET BY MOUTH EVERY DAY   Multiple Vitamin (MULTIVITAMIN) tablet Take 1 tablet by mouth daily with breakfast.   nystatin (MYCOSTATIN/NYSTOP) powder Apply 1 Application topically 3 (three) times daily.   omeprazole (PRILOSEC) 20 MG capsule Take 1 capsule (20 mg total) by mouth daily before breakfast.   OPCON-A 0.027-0.315 % SOLN Place 1 drop into both eyes 3 (three) times daily as needed (for itching).   pregabalin (LYRICA) 150 MG capsule Take 1 capsule (150 mg total) by mouth 2 (two) times daily.   Rimegepant Sulfate (NURTEC) 75 MG TBDP Take 1 tablet (75 mg  total) by mouth as needed (take 1 at onset of headache, max is 75 in 24 hours). (Patient taking differently: Take 75 mg by mouth as needed (at the onset of a headache- max daily dose is 75 mg/24 hours).)   Semaglutide,0.25 or 0.5MG /DOS, (OZEMPIC, 0.25 OR 0.5 MG/DOSE,) 2 MG/3ML SOPN Inject 0.5 mg into the skin every Tuesday.   SYMBICORT 160-4.5 MCG/ACT inhaler INHALE 2 PUFFS INTO THE LUNGS IN THE MORNING AND AT BEDTIME. RINSE MOUTH AFTER EACH USE (Patient taking differently: Inhale 1 puff into the lungs in the morning and at bedtime. Rinse mouth after each use)   traMADol (ULTRAM) 50 MG tablet Take 1 tablet (50 mg total)  by mouth 3 (three) times daily. (Patient not taking: Reported on 11/28/2022)   No facility-administered encounter medications on file as of 11/28/2022.    Allergies (verified) Cymbalta [duloxetine hcl], Pineapple, Sulfa antibiotics, Influenza vaccines, Other, Penicillins, and Theophyllines   History: Past Medical History:  Diagnosis Date   Acute nontraumatic kidney injury (HCC) 05/05/2013   Acute posthemorrhagic anemia 09/06/2012   Anemia    Arthritis    "plenty" (24-Aug-2012)   Asthma    Chest pain 03/30/2016   Chronic lower back pain    "they say I need a whole new spinal column" (2012/08/24)   COPD (chronic obstructive pulmonary disease) (HCC)    Degenerative arthritis    "all over" (08/24/12)   Fx ankle    GERD (gastroesophageal reflux disease)    Gout 05/05/2013   Hypertension    Migraines    "used to; totally stopped when I quit drinking 30 yr ago" (Aug 24, 2012)   Myalgia and myositis    Myocardial infarction [facility]) 1992   2012-08-24 "mild MI when son died "   Obesity    Osteoporosis    "all over" (2012-08-24)   Shortness of breath    when stressed.   Sleep apnea    "never did fix my machine; haven't used one for 5 years; I've lost 116# since then; no problems now" (08-24-2012)   Syncope 03/30/2016   Type II diabetes mellitus (HCC)    "borderline; don't test; take Metformin" (2012/08/24)   Past Surgical History:  Procedure Laterality Date   ABDOMINAL HYSTERECTOMY  1990's   GANGLION CYST EXCISION Right 1980's   "wrist" (24-Aug-2012)   JOINT REPLACEMENT     KNEE LIGAMENT RECONSTRUCTION Right 1980's   TONSILLECTOMY  ~ 1954   TOTAL HIP ARTHROPLASTY Left 2012-08-24   TOTAL HIP ARTHROPLASTY Left Aug 24, 2012   Procedure: TOTAL HIP ARTHROPLASTY;  Surgeon: [name], MD;  Location: [facility] OR;  Service: Orthopedics;  Laterality: Left;  Left Total Hip Arthroplasty   TOTAL HIP ARTHROPLASTY Left 08/28/2012   Procedure: Irrigation and Debridement hip ;  Surgeon: Kathryne Hitch, MD;   Location: [facility] OR;  Service: Orthopedics;  Laterality: Left;   TOTAL KNEE ARTHROPLASTY Left 2001   TOTAL KNEE ARTHROPLASTY Right 2004   Family History  Problem Relation Age of Onset   Arthritis Mother    Arthritis Father    Colon cancer Father    Diabetes Sister    Arthritis Sister    Diabetes Maternal Grandmother    Arthritis Maternal Grandmother    Diabetes Maternal Grandfather    Arthritis Maternal Grandfather    Migraines Neg Hx    Sleep apnea Neg Hx    Social History   Socioeconomic History   Marital status: Widowed    Spouse name: Not on file   Number of children:  5   Years of education: PhD   Highest education level: Not on file  Occupational History   Occupation: Retired  Tobacco Use   Smoking status: Former    Current packs/day: 1.00    Types: Cigarettes   Smokeless tobacco: Never   Tobacco comments:    08/03/2012 "quit 30 years ago"  Vaping Use   Vaping status: Never Used  Substance and Sexual Activity   Alcohol use: No    Alcohol/week: 0.0 standard drinks of alcohol    Comment: 08/03/2012 "used to be a beeralcoholic"; stopped ~ 30 yr ago"   Drug use: No   Sexual activity: Never  Other Topics Concern   Not on file  Social History Narrative   Lives at home with her mother and caregiver.- mother past away 01/2021 and now lives alone.   Occasional use of caffeine.   Right-handed.   Social Determinants of Health   Financial Resource Strain: Low Risk  (11/28/2022)   Overall Financial Resource Strain (CARDIA)    Difficulty of Paying Living Expenses: Not hard at all  Food Insecurity: No Food Insecurity (11/28/2022)   Hunger Vital Sign    Worried About Running Out of Food in the Last Year: Never true    Ran Out of Food in the Last Year: Never true  Transportation Needs: No Transportation Needs (11/28/2022)   PRAPARE - Administrator, Civil Service (Medical): No    Lack of Transportation (Non-Medical): No  Physical Activity: Inactive (11/28/2022)    Exercise Vital Sign    Days of Exercise per Week: 0 days    Minutes of Exercise per Session: 0 min  Stress: Stress Concern Present (11/28/2022)   [employer] of Occupational Health - Occupational Stress Questionnaire    Feeling of Stress : To some extent  Social Connections: Moderately Isolated (11/28/2022)   Social Connection and Isolation Panel [NHANES]    Frequency of Communication with Friends and Family: Never    Frequency of Social Gatherings with Friends and Family: More than three times a week    Attends Religious Services: More than 4 times per year    Active Member of [employer] or Organizations: No    Attends Banker Meetings: Never    Marital Status: Widowed    Tobacco Counseling Counseling given: Not Answered Tobacco comments: 08/03/2012 "quit 30 years ago"   Clinical Intake:  Pre-visit preparation completed: Yes  Pain : 0-10 Pain Score: 10-Worst pain ever Pain Type: Chronic pain Pain Location: Generalized Pain Descriptors / Indicators: Aching Pain Onset: More than a month ago Pain Frequency: Constant     Nutritional Risks: Nausea/ vomitting/ diarrhea (diarrhea after eating) Diabetes: Yes CBG done?: No Did pt. bring in CBG monitor from home?: No  How often do you need to have someone help you when you read instructions, pamphlets, or other written materials from your doctor or pharmacy?: 1 - Never  Interpreter Needed?: No  Information entered by :: NAllen LPN   Activities of Daily Living    11/28/2022   10:07 AM 07/06/2022    8:00 PM  In your present state of health, do you have any difficulty performing the following activities:  Hearing? 0 0  Vision? 0 0  Difficulty concentrating or making decisions? 0 0  Walking or climbing stairs? 1 1  Dressing or bathing? 1 0  Comment has to take time   Doing errands, shopping? 0 1  Preparing Food and eating ? N   Using the  Toilet? N   In the past six months, have you accidently leaked urine? Y    Comment if held too long   Do you have problems with loss of bowel control? Y   Comment if does not go right away   Managing your Medications? N   Managing your Finances? N   Housekeeping or managing your Housekeeping? N     Patient Care Team: Nche, Bonna Gains, NP as PCP - General (Internal Medicine) [name], MD (Inactive) as Consulting Physician (Orthopedic Surgery) Gaspar Cola, [facility] (Inactive) as Pharmacist (Pharmacist) Bevelyn Ngo as [facility] Care Management  Indicate any recent Medical Services you may have received from other than [facility] providers in the past year (date may be approximate).     Assessment:   This is a routine wellness examination for [place].  Hearing/Vision screen Hearing Screening - Comments:: Denies hearing issues Vision Screening - Comments:: No regular eye exams    Goals Addressed             This Visit's Progress    Patient Stated       11/28/2022, trying to eat right, read more       Depression Screen    11/28/2022   10:20 AM 11/05/2022   10:26 AM 09/17/2022   10:49 AM 08/06/2022   11:24 AM 06/04/2022   10:04 AM 06/04/2022   10:02 AM 01/15/2022   10:44 AM  PHQ 2/9 Scores  PHQ - 2 Score 0 0 3 0 3 3 2   PHQ- 9 Score   13  11      Fall Risk    11/28/2022   10:18 AM 11/13/2022    9:37 PM 11/13/2022    9:56 AM 11/05/2022   10:26 AM 06/04/2022   10:06 AM  Fall Risk   Falls in the past year? 1  1 0 0  Comment don't know what happened      Number falls in past yr: 0 0 0 0 0  Injury with Fall? 0 0 0 0 0  Risk for fall due to : Impaired balance/gait;Impaired mobility;Medication side effect Other (Comment) History of fall(s)  No Fall Risks  Risk for fall due to: Comment  The patient is not sure what made her fall     Follow up Falls prevention discussed;Falls evaluation completed  Falls evaluation completed  Falls evaluation completed    MEDICARE RISK AT HOME: Medicare Risk at Home Any stairs in or around  the home?: No If so, are there any without handrails?: No Home free of loose throw rugs in walkways, pet beds, electrical cords, etc?: Yes Adequate lighting in your home to reduce risk of falls?: Yes Life alert?: Yes Use of a cane, walker or w/c?: Yes Grab bars in the bathroom?: Yes Shower chair or bench in shower?: Yes Elevated toilet seat or a handicapped toilet?: Yes  TIMED UP AND GO:  Was the test performed?  No    Cognitive Function:    04/01/2017   10:40 AM  MMSE - Mini Mental State Exam  Orientation to time 5  Orientation to Place 5  Registration 3  Attention/ Calculation 5  Recall 1  Language- name 2 objects 2  Language- repeat 1  Language- follow 3 step command 3  Language- read & follow direction 1  Write a sentence 1  Copy design 1  Total score 28        11/28/2022   10:20 AM 11/26/2021  1:53 PM  6CIT Screen  What Year? 0 points 0 points  What month? 0 points 0 points  What time? 0 points 0 points  Count back from 20 0 points 0 points  Months in reverse 2 points 0 points  Repeat phrase 6 points 2 points  Total Score 8 points 2 points    Immunizations Immunization History  Administered Date(s) Administered   Moderna Sars-Covid-2 Vaccination 05/09/2019, 06/10/2019, 09/05/2020   Pneumococcal Conjugate-13 06/02/2016   Pneumococcal Polysaccharide-23 08/31/2017    TDAP status: Up to date  Flu Vaccine status: Declined, Education has been provided regarding the importance of this vaccine but patient still declined. Advised may receive this vaccine at local pharmacy or Health Dept. Aware to provide a copy of the vaccination record if obtained from local pharmacy or Health Dept. Verbalized acceptance and understanding.  Pneumococcal vaccine status: Up to date  Covid-19 vaccine status: Information provided on how to obtain vaccines.   Qualifies for Shingles Vaccine? Yes   Zostavax completed No   Shingrix Completed?: No.    Education has been provided  regarding the importance of this vaccine. Patient has been advised to call insurance company to determine out of pocket expense if they have not yet received this vaccine. Advised may also receive vaccine at local pharmacy or Health Dept. Verbalized acceptance and understanding.  Screening Tests Health Maintenance  Topic Date Due   Zoster Vaccines- Shingrix (1 of 2) Never done   OPHTHALMOLOGY EXAM  10/01/2022   COVID-19 Vaccine (4 - 2023-24 season) 11/02/2022   HEMOGLOBIN A1C  12/04/2022   MAMMOGRAM  04/22/2023   Diabetic kidney evaluation - Urine ACR  06/04/2023   FOOT EXAM  08/06/2023   Diabetic kidney evaluation - eGFR measurement  08/10/2023   Medicare Annual Wellness (AWV)  11/28/2023   Fecal DNA (Cologuard)  12/13/2024   Pneumonia Vaccine 51+ Years old  Completed   DEXA SCAN  Completed   Hepatitis C Screening  Completed   HPV VACCINES  Aged Out   DTaP/Tdap/Td  Discontinued   Colonoscopy  Discontinued    Health Maintenance  Health Maintenance Due  Topic Date Due   Zoster Vaccines- Shingrix (1 of 2) Never done   OPHTHALMOLOGY EXAM  10/01/2022   COVID-19 Vaccine (4 - 2023-24 season) 11/02/2022    Colorectal cancer screening: Type of screening: Cologuard. Completed 12/13/2021. Repeat every 3 years  Mammogram status: Completed 04/20/2022. Repeat every year  Bone Density status: Completed 03/18/2019.   Lung Cancer Screening: (Low Dose CT Chest recommended if Age 74-80 years, 20 pack-year currently smoking OR have quit w/in 15years.) does not qualify.   Lung Cancer Screening Referral: no  Additional Screening:  Hepatitis C Screening: does qualify; Completed 06/02/2016  Vision Screening: Recommended annual ophthalmology exams for early detection of glaucoma and other disorders of the eye. Is the patient up to date with their annual eye exam?  No  Who is the provider or what is the name of the office in which the patient attends annual eye exams? none If pt is not  established with a provider, would they like to be referred to a provider to establish care? No .   Dental Screening: Recommended annual dental exams for proper oral hygiene  Diabetic Foot Exam: Diabetic Foot Exam: Completed 08/06/2022  Community Resource Referral / Chronic Care Management: CRR required this visit?  Yes   CCM required this visit?  No     Plan:     I have personally reviewed and  noted the following in the patient's chart:   Medical and social history Use of alcohol, tobacco or illicit drugs  Current medications and supplements including opioid prescriptions. Patient is not currently taking opioid prescriptions. Functional ability and status Nutritional status Physical activity Advanced directives List of other physicians Hospitalizations, surgeries, and ER visits in previous 12 months Vitals Screenings to include cognitive, depression, and falls Referrals and appointments  In addition, I have reviewed and discussed with patient certain preventive protocols, quality metrics, and best practice recommendations. A written personalized care plan for preventive services as well as general preventive health recommendations were provided to patient.     Barb Merino, LPN   2/95/1884   After Visit Summary: (Pick Up) Due to this being a telephonic visit, with patients personalized plan was offered to patient and patient has requested to Pick up at office.  Nurse Notes: none

## 2022-11-28 NOTE — Patient Instructions (Signed)
Ms. Tewksbury , Thank you for taking time to come for your Medicare Wellness Visit. I appreciate your ongoing commitment to your health goals. Please review the following plan we discussed and let me know if I can assist you in the future.   Referrals/Orders/Follow-Ups/Clinician Recommendations: none  This is a list of the screening recommended for you and due dates:  Health Maintenance  Topic Date Due   Zoster (Shingles) Vaccine (1 of 2) Never done   Eye exam for diabetics  10/01/2022   COVID-19 Vaccine (4 - 2023-24 season) 11/02/2022   Hemoglobin A1C  12/04/2022   Mammogram  04/22/2023   Yearly kidney health urinalysis for diabetes  06/04/2023   Complete foot exam   08/06/2023   Yearly kidney function blood test for diabetes  08/10/2023   Medicare Annual Wellness Visit  11/28/2023   Cologuard (Stool DNA test)  12/13/2024   Pneumonia Vaccine  Completed   DEXA scan (bone density measurement)  Completed   Hepatitis C Screening  Completed   HPV Vaccine  Aged Out   DTaP/Tdap/Td vaccine  Discontinued   Colon Cancer Screening  Discontinued    Advanced directives: (ACP Link)Information on Advanced Care Planning can be found at [facility] [place] Advance Health Care Directives Advance Health Care Directives ([email or web address]   Next Medicare Annual Wellness Visit scheduled for next year: Yes  Insert Preventive Care attachment Insert FALL PREVENTION attachment if needed

## 2022-11-30 ENCOUNTER — Other Ambulatory Visit: Payer: Self-pay | Admitting: Nurse Practitioner

## 2022-11-30 DIAGNOSIS — M1A00X0 Idiopathic chronic gout, unspecified site, without tophus (tophi): Secondary | ICD-10-CM | POA: Diagnosis not present

## 2022-11-30 DIAGNOSIS — I5032 Chronic diastolic (congestive) heart failure: Secondary | ICD-10-CM | POA: Diagnosis not present

## 2022-11-30 DIAGNOSIS — M6281 Muscle weakness (generalized): Secondary | ICD-10-CM | POA: Diagnosis not present

## 2022-11-30 DIAGNOSIS — R2689 Other abnormalities of gait and mobility: Secondary | ICD-10-CM | POA: Diagnosis not present

## 2022-11-30 DIAGNOSIS — J449 Chronic obstructive pulmonary disease, unspecified: Secondary | ICD-10-CM | POA: Diagnosis not present

## 2022-11-30 DIAGNOSIS — I10 Essential (primary) hypertension: Secondary | ICD-10-CM

## 2022-12-01 ENCOUNTER — Telehealth: Payer: Self-pay

## 2022-12-01 NOTE — Telephone Encounter (Signed)
Telephone encounter was:  Unsuccessful.  12/01/2022 Name: Sandra Brennan MRN: [id number] DOB: [birth year]  Unsuccessful outbound call made today to assist with:  Transportation Needs   Outreach Attempt:  2nd Attempt  Unable to leave message at mobile [phone] voicemail full, no answer at home number [phone].  Bunnie Lederman [facility]  [facility], [facility] Guide Direct Dial: [phone]  Website: [email or web address]

## 2022-12-02 ENCOUNTER — Telehealth: Payer: Self-pay

## 2022-12-02 DIAGNOSIS — G4733 Obstructive sleep apnea (adult) (pediatric): Secondary | ICD-10-CM | POA: Diagnosis not present

## 2022-12-02 NOTE — Telephone Encounter (Signed)
Telephone encounter was:  Unsuccessful.  12/02/2022 Name: Sandra Brennan MRN: [id number] DOB: June 27, 1945  Unsuccessful outbound call made today to assist with:  Transportation Needs   Outreach Attempt:  3rd Attempt.  Referral closed unable to contact patient.  Unable to leave message at mobile [phone] voicemail full, no answer at home number [phone].  [name] [facility]  [facility], [facility] Guide Direct Dial: [phone]  Website: [email or web address]

## 2022-12-04 ENCOUNTER — Telehealth: Payer: Self-pay

## 2022-12-04 MED ORDER — TRAMADOL HCL 50 MG PO TABS: 50.0000 mg | ORAL_TABLET | Freq: Three times a day (TID) | ORAL | 0 refills | Status: DC

## 2022-12-04 NOTE — Telephone Encounter (Signed)
Patient called in asking about her tramadol and pregabalin refill.

## 2022-12-04 NOTE — Telephone Encounter (Signed)
UDS was Reviewed.  Tramadol e-scribed to pharmacy.  Her Pregabalin was filled on 10/25/2022, too early for refill: PCP prescribing.  Call placed to Sandra Brennan, no answer. Voicemail full

## 2022-12-07 ENCOUNTER — Other Ambulatory Visit: Payer: Self-pay | Admitting: Nurse Practitioner

## 2022-12-07 DIAGNOSIS — R600 Localized edema: Secondary | ICD-10-CM

## 2022-12-17 ENCOUNTER — Ambulatory Visit: Payer: Self-pay

## 2022-12-17 NOTE — Patient Outreach (Signed)
Care Coordination   Follow Up Visit Note   12/17/2022 Name: Sandra Brennan MRN: [id number] DOB: [birth year]  Sandra Brennan is a 77 y.o. year old female who sees Sandra Brennan, Sandra Gains, NP for primary care. I spoke with  Sandra Mikes Weyer by phone today.  What matters to the patients health and wellness today?  I spoke with Sandra Brennan, who mentioned she has been experiencing issues with her appetite and is unable to eat well. She reported that her food passes through her system too quickly, and she is also feeling fatigued. Although her diet has started to improve somewhat, she still experiences fatigue. Sandra Brennan had a fall three weeks ago, which resulted in her breaking the skin on her right arm. Fortunately, she now has a Counselling psychologist for added safety. We discussed the details of her fall, and I provided her with some education regarding fall prevention. Additionally, she has been experiencing swelling in both legs and is taking furosemide during the day to manage this. She tries to elevate her legs to help with the swelling. Sandra Brennan also mentioned having difficulty sleeping at night, which leads her to sleep during the day. I explained to her that this can create a cycle, as daytime sleep can negatively impact her nighttime rest. Despite these challenges, she continues to take her medications as prescribed.     Goals Addressed             This Visit's Progress    I want to manage my blood pressure and fall          Patient Goals/Self Care Activities: -Patient/Caregiver will take medications as prescribed   -Patient/Caregiver will attend all scheduled provider appointments -Patient/Caregiver will call pharmacy for medication refills 3-7 days in advance of running out of medications -Patient/Caregiver will call provider office for new concerns or questions  -Patient/Caregiver will focus on medication adherence by taking medications as prescribed   -Calls provider office for  new concerns, questions, or BP outside discussed parameters -Checks BP and records as discussed -Stay hydrated -Eat three meals a day BP Readings from Last 3 Encounters:  11/05/22 118/74  10/15/22 130/70  09/17/22 (!) 160/90  -Utilize (assistive device) appropriately with all ambulation -De-clutter walkways -Utilize home lighting for dim lit areas Keep your medical alert necklace           SDOH assessments and interventions completed:  No     Care Coordination Interventions:  Yes, provided   Interventions Today    Flowsheet Row Most Recent Value  Chronic Disease   Chronic disease during today's visit Hypertension (HTN)  General Interventions   General Interventions Discussed/Reviewed General Interventions Discussed  Education Interventions   Education Provided Provided Education  Provided Verbal Education On Nutrition  Nutrition Interventions   Nutrition Discussed/Reviewed Nutrition Discussed  Safety Interventions   Safety Discussed/Reviewed Fall Risk, Safety Discussed        Follow up plan: Follow up call scheduled for .  01/20/23  2 pm    Encounter Outcome:  Patient Visit Completed   Sandra Fairly RN, BSN, [facility]   Care Coordinator Phone: [phone]

## 2022-12-17 NOTE — Patient Instructions (Signed)
Visit Information  Thank you for taking time to visit with me today. Please don't hesitate to contact me if I can be of assistance to you.   Following are the goals we discussed today:   Goals Addressed             This Visit's Progress    I want to manage my blood pressure and fall          Patient Goals/Self Care Activities: -Patient/Caregiver will take medications as prescribed   -Patient/Caregiver will attend all scheduled provider appointments -Patient/Caregiver will call pharmacy for medication refills 3-7 days in advance of running out of medications -Patient/Caregiver will call provider office for new concerns or questions  -Patient/Caregiver will focus on medication adherence by taking medications as prescribed   -Calls provider office for new concerns, questions, or BP outside discussed parameters -Checks BP and records as discussed -Stay hydrated -Eat three meals a day BP Readings from Last 3 Encounters:  11/05/22 118/74  10/15/22 130/70  09/17/22 (!) 160/90  -Utilize (assistive device) appropriately with all ambulation -De-clutter walkways -Utilize home lighting for dim lit areas Keep your medical alert necklace           Our next appointment is by telephone on 01/20/23 at 2 pm  Please call the care guide team at [phone] if you need to cancel or reschedule your appointment.   If you are experiencing a Mental Health or Behavioral Health Crisis or need someone to talk to, please call 1-800-273-TALK (toll free, 24 hour hotline)  The patient verbalized understanding of instructions, educational materials, and care plan provided today and agreed to receive a mailed copy of patient instructions, educational materials, and care plan.   Juanell Fairly RN, BSN, [facility] [employer] blower/designer Phone: [phone]

## 2022-12-23 DIAGNOSIS — G4733 Obstructive sleep apnea (adult) (pediatric): Secondary | ICD-10-CM | POA: Diagnosis not present

## 2022-12-24 DIAGNOSIS — G4733 Obstructive sleep apnea (adult) (pediatric): Secondary | ICD-10-CM | POA: Diagnosis not present

## 2022-12-30 ENCOUNTER — Encounter: Payer: Self-pay | Admitting: Neurology

## 2022-12-30 ENCOUNTER — Telehealth: Payer: Self-pay | Admitting: Neurology

## 2022-12-30 DIAGNOSIS — J449 Chronic obstructive pulmonary disease, unspecified: Secondary | ICD-10-CM | POA: Diagnosis not present

## 2022-12-30 DIAGNOSIS — M6281 Muscle weakness (generalized): Secondary | ICD-10-CM | POA: Diagnosis not present

## 2022-12-30 DIAGNOSIS — M1A00X0 Idiopathic chronic gout, unspecified site, without tophus (tophi): Secondary | ICD-10-CM | POA: Diagnosis not present

## 2022-12-30 DIAGNOSIS — R2689 Other abnormalities of gait and mobility: Secondary | ICD-10-CM | POA: Diagnosis not present

## 2022-12-30 DIAGNOSIS — I5032 Chronic diastolic (congestive) heart failure: Secondary | ICD-10-CM | POA: Diagnosis not present

## 2022-12-30 NOTE — Telephone Encounter (Signed)
Unable to LVM, VM box full. Sent letter in mail informing pt of need to reschedule 01/28/23 appt - MD out

## 2023-01-05 ENCOUNTER — Encounter: Payer: 59 | Attending: Registered Nurse | Admitting: Registered Nurse

## 2023-01-05 ENCOUNTER — Encounter: Payer: Self-pay | Admitting: Registered Nurse

## 2023-01-05 VITALS — BP 132/81 | HR 82 | Ht 59.0 in | Wt 256.8 lb

## 2023-01-05 DIAGNOSIS — G894 Chronic pain syndrome: Secondary | ICD-10-CM | POA: Insufficient documentation

## 2023-01-05 DIAGNOSIS — M25511 Pain in right shoulder: Secondary | ICD-10-CM | POA: Diagnosis not present

## 2023-01-05 DIAGNOSIS — Z5181 Encounter for therapeutic drug level monitoring: Secondary | ICD-10-CM | POA: Insufficient documentation

## 2023-01-05 DIAGNOSIS — M7062 Trochanteric bursitis, left hip: Secondary | ICD-10-CM | POA: Diagnosis not present

## 2023-01-05 DIAGNOSIS — G8929 Other chronic pain: Secondary | ICD-10-CM | POA: Insufficient documentation

## 2023-01-05 DIAGNOSIS — Z79899 Other long term (current) drug therapy: Secondary | ICD-10-CM | POA: Diagnosis not present

## 2023-01-05 DIAGNOSIS — M797 Fibromyalgia: Secondary | ICD-10-CM | POA: Diagnosis not present

## 2023-01-05 DIAGNOSIS — M5416 Radiculopathy, lumbar region: Secondary | ICD-10-CM | POA: Insufficient documentation

## 2023-01-05 DIAGNOSIS — M25512 Pain in left shoulder: Secondary | ICD-10-CM | POA: Insufficient documentation

## 2023-01-05 DIAGNOSIS — G5793 Unspecified mononeuropathy of bilateral lower limbs: Secondary | ICD-10-CM | POA: Insufficient documentation

## 2023-01-05 DIAGNOSIS — M546 Pain in thoracic spine: Secondary | ICD-10-CM | POA: Diagnosis not present

## 2023-01-05 DIAGNOSIS — M7061 Trochanteric bursitis, right hip: Secondary | ICD-10-CM | POA: Diagnosis not present

## 2023-01-05 MED ORDER — TRAMADOL HCL 50 MG PO TABS: 50.0000 mg | ORAL_TABLET | Freq: Three times a day (TID) | ORAL | 5 refills | Status: DC

## 2023-01-05 MED ORDER — PREGABALIN 150 MG PO CAPS: 150.0000 mg | ORAL_CAPSULE | Freq: Two times a day (BID) | ORAL | 5 refills | Status: DC

## 2023-01-05 NOTE — Progress Notes (Signed)
Subjective:    Patient ID: Sandra Brennan, female    DOB: Apr 05, 1945, 77 y.o.   MRN: [id number]  HPI: Sandra Brennan is a 77 y.o. female who returns for follow up appointment for chronic pain and medication refill. She states her pain is located in her bilateral shoulders, upper- lower back pain radiating into her bilateral lower extremities and bilateral hip pain, She also reports tingling and burning in her bilateral feet. She rates her pain 6. Her current exercise regime is walking short distances with her walker/   Ms. Dillie Morphine equivalent is 30.00 MME.   Last Oral Swab was Performed on 11/05/2022, last fill on Tramadol was 07/25/2022.   Oral Swab was Performed today.    Pain Inventory Average Pain 8 Pain Right Now 6 My pain is sharp and aching  In the last 24 hours, has pain interfered with the following? General activity 5 Relation with others 6 Enjoyment of life 6 What TIME of day is your pain at its worst? morning  and night Sleep (in general) Poor  Pain is worse with: walking and standing Pain improves with: medication Relief from Meds: 6  Family History  Problem Relation Age of Onset  . Arthritis Mother   . Arthritis Father   . Colon cancer Father   . Diabetes Sister   . Arthritis Sister   . Diabetes Maternal Grandmother   . Arthritis Maternal Grandmother   . Diabetes Maternal Grandfather   . Arthritis Maternal Grandfather   . Migraines Neg Hx   . Sleep apnea Neg Hx    Social History   Socioeconomic History  . Marital status: Widowed    Spouse name: Not on file  . Number of children: 5  . Years of education: PhD  . Highest education level: Not on file  Occupational History  . Occupation: Retired  Tobacco Use  . Smoking status: Former    Current packs/day: 1.00    Types: Cigarettes  . Smokeless tobacco: Never  . Tobacco comments:    08/03/2012 "quit 30 years ago"  Vaping Use  . Vaping status: Never Used  Substance and Sexual Activity  .  Alcohol use: No    Alcohol/week: 0.0 standard drinks of alcohol    Comment: 08/03/2012 "used to be a beeralcoholic"; stopped ~ 30 yr ago"  . Drug use: No  . Sexual activity: Never  Other Topics Concern  . Not on file  Social History Narrative   Lives at home with her mother and caregiver.- mother past away 01/2021 and now lives alone.   Occasional use of caffeine.   Right-handed.   Social Determinants of Health   Financial Resource Strain: Low Risk  (11/28/2022)   Overall Financial Resource Strain (CARDIA)   . Difficulty of Paying Living Expenses: Not hard at all  Food Insecurity: No Food Insecurity (11/28/2022)   Hunger Vital Sign   . Worried About Programme researcher, broadcasting/film/video in the Last Year: Never true   . Ran Out of Food in the Last Year: Never true  Transportation Needs: No Transportation Needs (11/28/2022)   PRAPARE - Transportation   . Lack of Transportation (Medical): No   . Lack of Transportation (Non-Medical): No  Physical Activity: Inactive (11/28/2022)   Exercise Vital Sign   . Days of Exercise per Week: 0 days   . Minutes of Exercise per Session: 0 min  Stress: Stress Concern Present (11/28/2022)   [employer] of Occupational Health - Occupational Stress Questionnaire   .  Feeling of Stress : To some extent  Social Connections: Moderately Isolated (11/28/2022)   Social Connection and Isolation Panel [NHANES]   . Frequency of Communication with Friends and Family: Never   . Frequency of Social Gatherings with Friends and Family: More than three times a week   . Attends Religious Services: More than 4 times per year   . Active Member of Clubs or Organizations: No   . Attends Banker Meetings: Never   . Marital Status: Widowed   Past Surgical History:  Procedure Laterality Date  . ABDOMINAL HYSTERECTOMY  1990's  . GANGLION CYST EXCISION Right 1980's   "wrist" (August 31, 2012)  . JOINT REPLACEMENT    . KNEE LIGAMENT RECONSTRUCTION Right 1980's  . TONSILLECTOMY  ~  1954  . TOTAL HIP ARTHROPLASTY Left 31-Aug-2012  . TOTAL HIP ARTHROPLASTY Left 2012/08/31   Procedure: TOTAL HIP ARTHROPLASTY;  Surgeon: [name], MD;  Location: [facility] OR;  Service: Orthopedics;  Laterality: Left;  Left Total Hip Arthroplasty  . TOTAL HIP ARTHROPLASTY Left 08/28/2012   Procedure: Irrigation and Debridement hip ;  Surgeon: Kathryne Hitch, MD;  Location: [facility] OR;  Service: Orthopedics;  Laterality: Left;  . TOTAL KNEE ARTHROPLASTY Left 2001  . TOTAL KNEE ARTHROPLASTY Right 2004   Past Surgical History:  Procedure Laterality Date  . ABDOMINAL HYSTERECTOMY  1990's  . GANGLION CYST EXCISION Right 1980's   "wrist" (08-31-12)  . JOINT REPLACEMENT    . KNEE LIGAMENT RECONSTRUCTION Right 1980's  . TONSILLECTOMY  ~ 1954  . TOTAL HIP ARTHROPLASTY Left Aug 31, 2012  . TOTAL HIP ARTHROPLASTY Left 08-31-2012   Procedure: TOTAL HIP ARTHROPLASTY;  Surgeon: [name], MD;  Location: [facility] OR;  Service: Orthopedics;  Laterality: Left;  Left Total Hip Arthroplasty  . TOTAL HIP ARTHROPLASTY Left 08/28/2012   Procedure: Irrigation and Debridement hip ;  Surgeon: Kathryne Hitch, MD;  Location: [facility] OR;  Service: Orthopedics;  Laterality: Left;  . TOTAL KNEE ARTHROPLASTY Left 2001  . TOTAL KNEE ARTHROPLASTY Right 2004   Past Medical History:  Diagnosis Date  . Acute nontraumatic kidney injury (HCC) 05/05/2013  . Acute posthemorrhagic anemia 09/06/2012  . Anemia   . Arthritis    "plenty" (Aug 31, 2012)  . Asthma   . Chest pain 03/30/2016  . Chronic lower back pain    "they say I need a whole new spinal column" (2012-08-31)  . COPD (chronic obstructive pulmonary disease) (HCC)   . Degenerative arthritis    "all over" (31-Aug-2012)  . Fx ankle   . GERD (gastroesophageal reflux disease)   . Gout 05/05/2013  . Hypertension   . Migraines    "used to; totally stopped when I quit drinking 30 yr ago" (08/31/12)  . Myalgia and myositis   . Myocardial infarction [facility]) 1992   August 31, 2012  "mild MI when son died "  . Obesity   . Osteoporosis    "all over" (Aug 31, 2012)  . Shortness of breath    when stressed.  . Sleep apnea    "never did fix my machine; haven't used one for 5 years; I've lost 116# since then; no problems now" (31-Aug-2012)  . Syncope 03/30/2016  . Type II diabetes mellitus (HCC)    "borderline; don't test; take Metformin" (31-Aug-2012)   BP 132/81   Pulse 82   Ht 4\' 11"  (1.499 m)   Wt 256 lb 12.8 oz (116.5 kg)   SpO2 95%   BMI 51.87 kg/m   Opioid Risk Score:  Fall Risk Score:  `1  Depression screen [facility] 2/9     01/05/2023    8:19 AM 11/28/2022   10:20 AM 11/05/2022   10:26 AM 09/17/2022   10:49 AM 08/06/2022   11:24 AM 06/04/2022   10:04 AM 06/04/2022   10:02 AM  Depression screen PHQ 2/9  Decreased Interest 1 0 0 2 0 2 2  Down, Depressed, Hopeless 1 0 0 1 0 1 1  PHQ - 2 Score 2 0 0 3 0 3 3  Altered sleeping    3  3   Tired, decreased energy    3  2   Change in appetite    3  3   Feeling bad or failure about yourself     0  0   Trouble concentrating    1  0   Moving slowly or fidgety/restless    0  0   Suicidal thoughts    0  0   PHQ-9 Score    13  11   Difficult doing work/chores    Somewhat difficult  Somewhat difficult      Review of Systems  Constitutional: Negative.   HENT: Negative.    Respiratory: Negative.    Cardiovascular: Negative.   Gastrointestinal: Negative.   Endocrine: Negative.   Genitourinary: Negative.   Musculoskeletal:  Positive for arthralgias, back pain, gait problem and myalgias.  Skin: Negative.   Allergic/Immunologic: Negative.   Hematological: Negative.   Psychiatric/Behavioral:  Positive for dysphoric mood.   All other systems reviewed and are negative.      Objective:   Physical Exam Vitals and nursing note reviewed.  Constitutional:      Appearance: Normal appearance. She is obese.  Cardiovascular:     Rate and Rhythm: Normal rate and regular rhythm.     Pulses: Normal pulses.     Heart sounds: Normal  heart sounds.  Pulmonary:     Effort: Pulmonary effort is normal.     Breath sounds: Normal breath sounds.  Musculoskeletal:     Comments: Normal Muscle Bulk and Muscle Testing Reveals:  Upper Extremities: Decreased ROM 30 Degrees and Muscle Strength 5/5 Bilateral AC Joint Tenderness Thoracic Hypersensitivity Lumbar Paraspinal Tenderness: L-3-L-5 Lower Extremities: Decreased ROM and Muscle Strength 5/5 Bilateral Lower Extremities Flexion Produces Pain into her Bilateral Lower Extremities.  Arises from Table slowly using walker for support Antalgic Gait     Skin:    General: Skin is warm and dry.  Neurological:     Mental Status: She is alert and oriented to person, place, and time.  Psychiatric:        Mood and Affect: Mood normal.        Behavior: Behavior normal.         Assessment & Plan:  1. Chronic  Bilateral Shoulder Pain: Continue HEP as Tolerated.  Continue to Monitor. 01/05/2023 2. Lumbar Spinal Stenosis: Continue HEP as Tolerated . Continue to Monitor. 01/05/2023 3. Fibromyalgia: Refilled: Pregabalin 150 mg Twice a day #60. Continue HEP as Tolerated. Continue to Monitor. 01/05/2023 4. Bilateral Greater Trochanter Bursitis: Continue to alternate Ice and Heat Therapy. Continue to Monitor. 01/05/2023. 5. Bilateral Lower Extremities with Neuropathic Pain: Continue Pregablin. Continue to Monitor. 01/05/2023 6.. Chronic Pain Syndrome: Continue :: Tramadol 50 mg one tablet TID as needed for pain. #90  We will continue the opioid monitoring program, this consists of regular clinic visits, examinations, urine drug screen, pill counts as well as use of [place] Controlled Substance  Reporting system. A 12 month History has been reviewed on the [place] Controlled Substance Reporting System on 01/05/2023.  Continue to monitor.  7. Chronic Bilateral Thoracic Pain : Continue HEP as Tolerated. Continue to Monitor. 01/05/2023 8. Cervicalgia: No Complaints today. Continue HEP  as Tolerated. Continue to Monitor.    F/U in  2 months

## 2023-01-08 DIAGNOSIS — R269 Unspecified abnormalities of gait and mobility: Secondary | ICD-10-CM | POA: Diagnosis not present

## 2023-01-08 DIAGNOSIS — M48061 Spinal stenosis, lumbar region without neurogenic claudication: Secondary | ICD-10-CM | POA: Diagnosis not present

## 2023-01-10 ENCOUNTER — Other Ambulatory Visit: Payer: Self-pay | Admitting: Nurse Practitioner

## 2023-01-10 DIAGNOSIS — E66813 Obesity, class 3: Secondary | ICD-10-CM

## 2023-01-10 DIAGNOSIS — E1142 Type 2 diabetes mellitus with diabetic polyneuropathy: Secondary | ICD-10-CM

## 2023-01-11 LAB — DRUG TOX ALC METAB W/CON, ORAL FLD: Alcohol Metabolite: NEGATIVE ng/mL (ref <25)

## 2023-01-11 LAB — DRUG TOX MONITOR 1 W/CONF, ORAL FLD

## 2023-01-15 ENCOUNTER — Encounter: Payer: Self-pay | Admitting: Nurse Practitioner

## 2023-01-15 ENCOUNTER — Ambulatory Visit: Payer: 59 | Admitting: Nurse Practitioner

## 2023-01-15 VITALS — BP 136/74 | HR 70 | Temp 98.3°F | Resp 18 | Wt 267.0 lb

## 2023-01-15 DIAGNOSIS — E1169 Type 2 diabetes mellitus with other specified complication: Secondary | ICD-10-CM | POA: Diagnosis not present

## 2023-01-15 DIAGNOSIS — K5909 Other constipation: Secondary | ICD-10-CM | POA: Diagnosis not present

## 2023-01-15 DIAGNOSIS — E785 Hyperlipidemia, unspecified: Secondary | ICD-10-CM

## 2023-01-15 DIAGNOSIS — N1832 Chronic kidney disease, stage 3b: Secondary | ICD-10-CM | POA: Diagnosis not present

## 2023-01-15 DIAGNOSIS — E782 Mixed hyperlipidemia: Secondary | ICD-10-CM

## 2023-01-15 DIAGNOSIS — E66813 Obesity, class 3: Secondary | ICD-10-CM

## 2023-01-15 DIAGNOSIS — E1142 Type 2 diabetes mellitus with diabetic polyneuropathy: Secondary | ICD-10-CM | POA: Diagnosis not present

## 2023-01-15 DIAGNOSIS — M1A00X0 Idiopathic chronic gout, unspecified site, without tophus (tophi): Secondary | ICD-10-CM | POA: Diagnosis not present

## 2023-01-15 DIAGNOSIS — R197 Diarrhea, unspecified: Secondary | ICD-10-CM

## 2023-01-15 DIAGNOSIS — Z7985 Long-term (current) use of injectable non-insulin antidiabetic drugs: Secondary | ICD-10-CM | POA: Diagnosis not present

## 2023-01-15 DIAGNOSIS — R1084 Generalized abdominal pain: Secondary | ICD-10-CM

## 2023-01-15 LAB — LIPID PANEL
Cholesterol: 172 mg/dL (ref 0–200)
HDL: 55.9 mg/dL (ref >39.00)
LDL Cholesterol: 101 mg/dL — ABNORMAL HIGH (ref 0–99)
NonHDL: 116.08
Total CHOL/HDL Ratio: 3
Triglycerides: 76 mg/dL (ref 0.0–149.0)
VLDL: 15.2 mg/dL (ref 0.0–40.0)

## 2023-01-15 LAB — COMPREHENSIVE METABOLIC PANEL
ALT: 12 U/L (ref 0–35)
AST: 15 U/L (ref 0–37)
Albumin: 3.8 g/dL (ref 3.5–5.2)
Alkaline Phosphatase: 90 U/L (ref 39–117)
BUN: 11 mg/dL (ref 6–23)
CO2: 33 meq/L — ABNORMAL HIGH (ref 19–32)
Calcium: 9.1 mg/dL (ref 8.4–10.5)
Chloride: 105 meq/L (ref 96–112)
Creatinine, Ser: 0.98 mg/dL (ref 0.40–1.20)
GFR: 56.42 mL/min — ABNORMAL LOW (ref >60.00)
Glucose, Bld: 76 mg/dL (ref 70–99)
Potassium: 4.9 meq/L (ref 3.5–5.1)
Sodium: 143 meq/L (ref 135–145)
Total Bilirubin: 0.3 mg/dL (ref 0.2–1.2)
Total Protein: 6.8 g/dL (ref 6.0–8.3)

## 2023-01-15 LAB — LIPASE: Lipase: 38 U/L (ref 11.0–59.0)

## 2023-01-15 LAB — HEMOGLOBIN A1C: Hgb A1c MFr Bld: 5.7 % (ref 4.6–6.5)

## 2023-01-15 LAB — URIC ACID: Uric Acid, Serum: 5.6 mg/dL (ref 2.4–7.0)

## 2023-01-15 MED ORDER — OZEMPIC (0.25 OR 0.5 MG/DOSE) 2 MG/3ML ~~LOC~~ SOPN
0.5000 mg | PEN_INJECTOR | SUBCUTANEOUS | 1 refills | Status: DC
Start: 2023-01-20 — End: 2023-06-12

## 2023-01-15 MED ORDER — ALLOPURINOL 100 MG PO TABS
100.0000 mg | ORAL_TABLET | Freq: Every day | ORAL | 3 refills | Status: DC
Start: 2023-01-15 — End: 2023-10-15

## 2023-01-15 MED ORDER — ATORVASTATIN CALCIUM 20 MG PO TABS: 20.0000 mg | ORAL_TABLET | Freq: Every evening | ORAL | 3 refills | Status: DC

## 2023-01-15 NOTE — Assessment & Plan Note (Addendum)
Repeat hgbA1c: 5.7%  controlled with ozempic. Current use of statin Use of lyrica for neuropathy Advised to schedule DIABETES eye exam  Maintain med dose

## 2023-01-15 NOTE — Assessment & Plan Note (Signed)
Repeat lipid panel: LDL not at goal Increase atorvastatin dose to 20mg  F/up in 3months

## 2023-01-15 NOTE — Assessment & Plan Note (Signed)
Reports change in bowel pattern: diarrhea and ABDOMEN pain after each meal. Onset >75months No blood in stool Last colonoscopy 2014: hx of colon polyp Cologuard completed 2023: negative Check CMP and lipase: normal Entered GI referral

## 2023-01-15 NOTE — Patient Instructions (Addendum)
Schedule appointment with ophthalmology: [facility]. Maintain current med dose F/up with Dr. Riley Lam about pain management Go to lab

## 2023-01-15 NOTE — Assessment & Plan Note (Signed)
Repeat uric acid and BMP: stable Maintain allopurinol dose

## 2023-01-15 NOTE — Progress Notes (Signed)
Established Patient Visit  Patient: Sandra Brennan   DOB: [birth year]   77 y.o. Female  MRN: [id number] Visit Date: 01/15/2023  Subjective:    Chief Complaint  Patient presents with   OFFICE VISIT     3 month follow up. PT C/O of shoulder pain mainly right side for 2 weeks. PT is due for eye exam and shingles vaccine.     Diabetes   Hyperlipidemia   Hypertension   HPI Waiting for powered wheelchair to be delivered.  Wt Readings from Last 3 Encounters:  01/15/23 267 lb (121.1 kg)  01/05/23 256 lb 12.8 oz (116.5 kg)  11/05/22 270 lb (122.5 kg)       01/15/2023   10:11 AM 01/15/2023    9:28 AM 01/05/2023    8:19 AM  Depression screen PHQ 2/9  Decreased Interest 0 0 1  Down, Depressed, Hopeless 0 0 1  PHQ - 2 Score 0 0 2  Altered sleeping 1    Tired, decreased energy 1    Change in appetite 1    Feeling bad or failure about yourself  0    Trouble concentrating 0    Moving slowly or fidgety/restless 0    Suicidal thoughts 0    PHQ-9 Score 3    Difficult doing work/chores Not difficult at all      Type 2 diabetes mellitus with diabetic polyneuropathy, without long-term current use of insulin (HCC) Repeat hgbA1c: 5.7%  controlled with ozempic. Current use of statin Use of lyrica for neuropathy Advised to schedule DIABETES eye exam  Maintain med dose  Hyperlipidemia associated with type 2 diabetes mellitus (HCC) Repeat lipid panel: LDL not at goal Increase atorvastatin dose to 20mg  F/up in 3months  Chronic gouty arthritis Repeat uric acid and BMP: stable Maintain allopurinol dose  CKD (chronic kidney disease) stage 3, GFR 30-59 ml/min (HCC) Repeat BMP: stable  Chronic constipation Reports change in bowel pattern: diarrhea and ABDOMEN pain after each meal. Onset >31months No blood in stool Last colonoscopy 2014: hx of colon polyp Cologuard completed 2023: negative Check CMP and lipase: normal Entered GI referral   Reviewed medical, surgical,  and social history today  Medications: Outpatient Medications Prior to Visit  Medication Sig   acetaminophen (TYLENOL 8 HOUR ARTHRITIS PAIN) 650 MG CR tablet Take 1,300 mg by mouth every 8 (eight) hours as needed (for headaches).   albuterol (VENTOLIN HFA) 108 (90 Base) MCG/ACT inhaler INHALE 1 PUFF INTO THE LUNGS EVERY 6 (SIX) HOURS AS NEEDED FOR SHORTNESS OF BREATH OR WHEEZING.   amLODipine (NORVASC) 10 MG tablet Take 1 tablet (10 mg total) by mouth at bedtime.   aspirin EC 81 MG tablet Take 81 mg by mouth daily.   cetirizine (ZYRTEC) 10 MG tablet Take 10 mg by mouth in the morning.   cholecalciferol (VITAMIN D3) 25 MCG (1000 UNIT) tablet Take 1,000 Units by mouth daily.   escitalopram (LEXAPRO) 10 MG tablet TAKE 1 TABLET BY MOUTH EVERY DAY   furosemide (LASIX) 20 MG tablet TAKE 1/2 TABLET BY MOUTH DAILY   lisinopril (ZESTRIL) 40 MG tablet TAKE 1 TABLET BY MOUTH EVERY DAY   Multiple Vitamin (MULTIVITAMIN) tablet Take 1 tablet by mouth daily with breakfast.   nystatin (MYCOSTATIN/NYSTOP) powder Apply 1 Application topically 3 (three) times daily.   omeprazole (PRILOSEC) 20 MG capsule Take 1 capsule (20 mg total) by mouth daily before breakfast.  OPCON-A 0.027-0.315 % SOLN Place 1 drop into both eyes 3 (three) times daily as needed (for itching).   pregabalin (LYRICA) 150 MG capsule Take 1 capsule (150 mg total) by mouth 2 (two) times daily.   Rimegepant Sulfate (NURTEC) 75 MG TBDP Take 1 tablet (75 mg total) by mouth as needed (take 1 at onset of headache, max is 75 in 24 hours). (Patient taking differently: Take 75 mg by mouth as needed (at the onset of a headache- max daily dose is 75 mg/24 hours).)   SYMBICORT 160-4.5 MCG/ACT inhaler INHALE 2 PUFFS INTO THE LUNGS IN THE MORNING AND AT BEDTIME. RINSE MOUTH AFTER EACH USE (Patient taking differently: Inhale 1 puff into the lungs in the morning and at bedtime. Rinse mouth after each use)   traMADol (ULTRAM) 50 MG tablet Take 1 tablet (50 mg  total) by mouth 3 (three) times daily.   [DISCONTINUED] allopurinol (ZYLOPRIM) 100 MG tablet TAKE 1 TABLET BY MOUTH EVERY DAY   [DISCONTINUED] atorvastatin (LIPITOR) 10 MG tablet TAKE 1 TABLET BY MOUTH  DAILY AT 6 PM. (Patient taking differently: Take 10 mg by mouth daily at 6 PM.)   [DISCONTINUED] Semaglutide,0.25 or 0.5MG /DOS, (OZEMPIC, 0.25 OR 0.5 MG/DOSE,) 2 MG/3ML SOPN Inject 0.5 mg into the skin every Tuesday. Need office visit for additional refills   No facility-administered medications prior to visit.   Reviewed past medical and social history.   ROS per HPI above      Objective:  BP 136/74 (BP Location: Left Arm, Patient Position: Sitting, Cuff Size: Large)   Pulse 70   Temp 98.3 F (36.8 C) (Temporal)   Resp 18   Wt 267 lb (121.1 kg)   SpO2 99%   BMI 53.93 kg/m      Physical Exam Vitals and nursing note reviewed.  Cardiovascular:     Rate and Rhythm: Normal rate and regular rhythm.     Pulses: Normal pulses.     Heart sounds: Normal heart sounds.  Pulmonary:     Effort: Pulmonary effort is normal.     Breath sounds: Normal breath sounds.  Musculoskeletal:     Right lower leg: No edema.     Left lower leg: No edema.  Neurological:     Mental Status: She is alert and oriented to person, place, and time.  Psychiatric:        Mood and Affect: Mood normal.        Behavior: Behavior normal.        Thought Content: Thought content normal.     Results for orders placed or performed in visit on 01/15/23  Comprehensive metabolic panel  Result Value Ref Range   Sodium 143 135 - 145 mEq/L   Potassium 4.9 3.5 - 5.1 mEq/L   Chloride 105 96 - 112 mEq/L   CO2 33 (H) 19 - 32 mEq/L   Glucose, Bld 76 70 - 99 mg/dL   BUN 11 6 - 23 mg/dL   Creatinine, Ser 7.82 0.40 - 1.20 mg/dL   Total Bilirubin 0.3 0.2 - 1.2 mg/dL   Alkaline Phosphatase 90 39 - 117 U/L   AST 15 0 - 37 U/L   ALT 12 0 - 35 U/L   Total Protein 6.8 6.0 - 8.3 g/dL   Albumin 3.8 3.5 - 5.2 g/dL   GFR  95.62 (L) >13.08 mL/min   Calcium 9.1 8.4 - 10.5 mg/dL  Hemoglobin M5H  Result Value Ref Range   Hgb A1c MFr Bld 5.7 4.6 -  6.5 %  Lipid panel  Result Value Ref Range   Cholesterol 172 0 - 200 mg/dL   Triglycerides 16.1 0.0 - 149.0 mg/dL   HDL 09.60 >45.40 mg/dL   VLDL 98.1 0.0 - 19.1 mg/dL   LDL Cholesterol 478 (H) 0 - 99 mg/dL   Total CHOL/HDL Ratio 3    NonHDL 116.08   Uric acid  Result Value Ref Range   Uric Acid, Serum 5.6 2.4 - 7.0 mg/dL  Lipase  Result Value Ref Range   Lipase 38.0 11.0 - 59.0 U/L      Assessment & Plan:    Problem List Items Addressed This Visit     Chronic constipation    Reports change in bowel pattern: diarrhea and ABDOMEN pain after each meal. Onset >13months No blood in stool Last colonoscopy 2014: hx of colon polyp Cologuard completed 2023: negative Check CMP and lipase: normal Entered GI referral      Chronic gouty arthritis    Repeat uric acid and BMP: stable Maintain allopurinol dose      Relevant Medications   allopurinol (ZYLOPRIM) 100 MG tablet   Other Relevant Orders   Uric acid (Completed)   CKD (chronic kidney disease) stage 3, GFR 30-59 ml/min (HCC)    Repeat BMP: stable      Relevant Orders   Comprehensive metabolic panel (Completed)   Hyperlipidemia associated with type 2 diabetes mellitus (HCC)    Repeat lipid panel: LDL not at goal Increase atorvastatin dose to 20mg  F/up in 3months      Relevant Medications   atorvastatin (LIPITOR) 20 MG tablet   Semaglutide,0.25 or 0.5MG /DOS, (OZEMPIC, 0.25 OR 0.5 MG/DOSE,) 2 MG/3ML SOPN (Start on 01/20/2023)   Other Relevant Orders   Lipid panel (Completed)   Type 2 diabetes mellitus with diabetic polyneuropathy, without long-term current use of insulin (HCC) - Primary (Chronic)    Repeat hgbA1c: 5.7%  controlled with ozempic. Current use of statin Use of lyrica for neuropathy Advised to schedule DIABETES eye exam  Maintain med dose      Relevant Medications    atorvastatin (LIPITOR) 20 MG tablet   Semaglutide,0.25 or 0.5MG /DOS, (OZEMPIC, 0.25 OR 0.5 MG/DOSE,) 2 MG/3ML SOPN (Start on 01/20/2023)   Other Relevant Orders   Comprehensive metabolic panel (Completed)   Hemoglobin A1c (Completed)   Other Visit Diagnoses     Diarrhea, unspecified type       Relevant Orders   Ambulatory referral to Gastroenterology   Lipase (Completed)   Generalized abdominal pain       Relevant Orders   Ambulatory referral to Gastroenterology   Lipase (Completed)   Mixed hyperlipidemia       Relevant Medications   atorvastatin (LIPITOR) 20 MG tablet   Class 3 severe obesity due to excess calories with serious comorbidity and body mass index (BMI) of 50.0 to 59.9 in adult [facility])   (Chronic)     Relevant Medications   Semaglutide,0.25 or 0.5MG /DOS, (OZEMPIC, 0.25 OR 0.5 MG/DOSE,) 2 MG/3ML SOPN (Start on 01/20/2023)      Return in about 6 months (around 07/15/2023) for HTN, DM, hyperlipidemia (fasting).     Alysia Penna, NP

## 2023-01-15 NOTE — Assessment & Plan Note (Signed)
Repeat BMP: stable °

## 2023-01-23 ENCOUNTER — Ambulatory Visit: Payer: Self-pay

## 2023-01-23 DIAGNOSIS — R600 Localized edema: Secondary | ICD-10-CM

## 2023-01-23 DIAGNOSIS — G43009 Migraine without aura, not intractable, without status migrainosus: Secondary | ICD-10-CM

## 2023-01-23 DIAGNOSIS — M4802 Spinal stenosis, cervical region: Secondary | ICD-10-CM

## 2023-01-23 DIAGNOSIS — Z789 Other specified health status: Secondary | ICD-10-CM

## 2023-01-23 DIAGNOSIS — G4733 Obstructive sleep apnea (adult) (pediatric): Secondary | ICD-10-CM | POA: Diagnosis not present

## 2023-01-23 DIAGNOSIS — E1169 Type 2 diabetes mellitus with other specified complication: Secondary | ICD-10-CM

## 2023-01-24 DIAGNOSIS — G4733 Obstructive sleep apnea (adult) (pediatric): Secondary | ICD-10-CM | POA: Diagnosis not present

## 2023-01-24 NOTE — Patient Instructions (Signed)
Visit Information  Thank you for taking time to visit with me today. Please don't hesitate to contact me if I can be of assistance to you.   Following are the goals we discussed today:   Goals Addressed             This Visit's Progress    I want to manage my blood pressure and fall          Patient Goals/Self Care Activities: -Patient/Caregiver will take medications as prescribed   -Patient/Caregiver will attend all scheduled provider appointments -Patient/Caregiver will call pharmacy for medication refills 3-7 days in advance of running out of medications -Patient/Caregiver will call provider office for new concerns or questions  -Patient/Caregiver will focus on medication adherence by taking medications as prescribed   -Calls provider office for new concerns, questions, or BP outside discussed parameters -Checks BP and records as discussed -Stay hydrated -Eat three meals a day BP Readings from Last 3 Encounters:  01/15/23 136/74  01/05/23 132/81  11/05/22 118/74  -Utilize (assistive device) appropriately with all ambulation -De-clutter walkways -Utilize home lighting for dim lit areas         Our next appointment is by telephone on 02/17/23 at 115 pm  Please call the care guide team at [phone] if you need to cancel or reschedule your appointment.   If you are experiencing a Mental Health or Behavioral Health Crisis or need someone to talk to, please call 1-800-273-TALK (toll free, 24 hour hotline)  The patient verbalized understanding of instructions, educational materials, and care plan provided today and agreed to receive a mailed copy of patient instructions, educational materials, and care plan.   Juanell Fairly RN, BSN, [facility]  [facility], [facility]  Care Coordinator Phone: [phone]

## 2023-01-24 NOTE — Patient Outreach (Signed)
  Care Coordination   Follow Up Visit Note   01/24/2023 Name: Sandra Brennan MRN: [id number] DOB: [birth year]  Sandra Brennan is a 77 y.o. year old female who sees Nche, Bonna Gains, NP for primary care. I spoke with  Sandra Mikes Coltrin by phone today.  What matters to the patients health and wellness today?  Sandra Brennan reported that she had an appointment on November 14th and that her blood pressure was good, measuring 136/74. She mentioned experiencing some eye issues and was advised to get a diabetic eye exam. Her A1C level was checked and recorded at 5.7, which is within the normal range. She also stated that she had her eyes examined the day before and was informed that she has small cataracts that do not require removal at this time. During her visit, the doctor indicated that a referral would be made to gastroenterology regarding her stomach issues. Sandra Brennan is currently taking Lasix, which is effective in reducing the fluid in her legs. She is also experiencing some problems with her right arm and is using a cream similar to Aspercreme, along with a spray, for relief. Additionally, I suggested that she could use a heating pad to help alleviate her discomfort.    Goals Addressed             This Visit's Progress    I want to manage my blood pressure and fall          Patient Goals/Self Care Activities: -Patient/Caregiver will take medications as prescribed   -Patient/Caregiver will attend all scheduled provider appointments -Patient/Caregiver will call pharmacy for medication refills 3-7 days in advance of running out of medications -Patient/Caregiver will call provider office for new concerns or questions  -Patient/Caregiver will focus on medication adherence by taking medications as prescribed   -Calls provider office for new concerns, questions, or BP outside discussed parameters -Checks BP and records as discussed -Stay hydrated -Eat three meals a day BP Readings from  Last 3 Encounters:  01/15/23 136/74  01/05/23 132/81  11/05/22 118/74  -Utilize (assistive device) appropriately with all ambulation -De-clutter walkways -Utilize home lighting for dim lit areas         SDOH assessments and interventions completed:  No     Care Coordination Interventions:  Yes, provided   Interventions Today    Flowsheet Row Most Recent Value  Chronic Disease   Chronic disease during today's visit Hypertension (HTN)  General Interventions   General Interventions Discussed/Reviewed General Interventions Discussed  Pharmacy Interventions   Pharmacy Dicussed/Reviewed Pharmacy Topics Discussed  Safety Interventions   Safety Discussed/Reviewed Fall Risk, Safety Discussed        Follow up plan: Follow up call scheduled for 02/17/23  115 pm    Encounter Outcome:  Patient Visit Completed   Juanell Fairly RN, BSN, [facility] [place]  [facility], [facility] Health  Care Coordinator Phone: [phone]

## 2023-01-26 ENCOUNTER — Telehealth: Payer: Self-pay | Admitting: *Deleted

## 2023-01-26 NOTE — Progress Notes (Signed)
  Care Coordination   Note   01/26/2023 Name: Sandra Brennan MRN: [id number] DOB: [birth year]  [name] is a 77 y.o. year old female who sees Nche, Bonna Gains, NP for primary care. I reached out to Dub Mikes Monte by phone today to offer care coordination services.  [name] was given information about Care Coordination services today including:   The Care Coordination services include support from the care team which includes your Nurse Coordinator, Clinical Social Worker, or Pharmacist.  The Care Coordination team is here to help remove barriers to the health concerns and goals most important to you. Care Coordination services are voluntary, and the patient may decline or stop services at any time by request to their care team member.   Care Coordination Consent Status: Patient agreed to services and verbal consent obtained.   Follow up plan:  Telephone appointment with care coordination team member scheduled for:  02/03/2023  Encounter Outcome:  Patient Scheduled from referral   Burman Nieves, [facility] Care Coordination Care Guide Direct Dial: [phone]

## 2023-01-28 ENCOUNTER — Ambulatory Visit: Payer: 59 | Admitting: Neurology

## 2023-01-30 DIAGNOSIS — J449 Chronic obstructive pulmonary disease, unspecified: Secondary | ICD-10-CM | POA: Diagnosis not present

## 2023-01-30 DIAGNOSIS — R2689 Other abnormalities of gait and mobility: Secondary | ICD-10-CM | POA: Diagnosis not present

## 2023-01-30 DIAGNOSIS — M1A00X0 Idiopathic chronic gout, unspecified site, without tophus (tophi): Secondary | ICD-10-CM | POA: Diagnosis not present

## 2023-01-30 DIAGNOSIS — I5032 Chronic diastolic (congestive) heart failure: Secondary | ICD-10-CM | POA: Diagnosis not present

## 2023-01-30 DIAGNOSIS — M6281 Muscle weakness (generalized): Secondary | ICD-10-CM | POA: Diagnosis not present

## 2023-02-03 ENCOUNTER — Ambulatory Visit: Payer: Self-pay | Admitting: Licensed Clinical Social Worker

## 2023-02-03 NOTE — Patient Outreach (Signed)
  Care Coordination   Initial Visit Note   02/03/2023 Name: Sandra Brennan MRN: [id number] DOB: 10-14-1945  Sandra Brennan is a 77 y.o. year old female who sees Nche, Bonna Gains, NP for primary care. I spoke with  Sandra Brennan by phone today.  What matters to the patients health and wellness today?  Life Alert and transportation     Goals Addressed             This Visit's Progress    Care Coordinator Activities       Care Coordination Interventions: Patient stated that her insurance [employer] will no longer cover her life alert, SW discussed with the patient abut the program in [place] and the patient declines the information and stated that she could not pay for anything else and that she will just go without it and speak with her PCP about it. Patient stated that she is in need of transportation and it was in her plans to contact [facility] to get her medical transportation in place. SW discussed the SCAT application and process the patient stated that she wanted to try to get the transportation through [facility] and if all else fails then she will have the SW assist her with applying for SCAT The Sw completed the SDOH questions and no other concerns or issues at this time. The SW will follow up with the patient on 02/20/2023 at 10:45 am.        SDOH assessments and interventions completed:  Yes  SDOH Interventions Today    Flowsheet Row Most Recent Value  SDOH Interventions   Food Insecurity Interventions Intervention Not Indicated  Housing Interventions Intervention Not Indicated  Transportation Interventions Intervention Not Indicated  Utilities Interventions Intervention Not Indicated        Care Coordination Interventions:  Yes, provided  Interventions Today    Flowsheet Row Most Recent Value  General Interventions   General Interventions Discussed/Reviewed General Interventions Discussed, Durable Medical Equipment (DME)  [Patient stated that [facility] will no longer pay for her  life alert]        Follow up plan: Follow up call scheduled for 02/20/2023 at 10:45 am    Encounter Outcome:  Patient Visit Completed   Jeanie Cooks, PhD [facility], [facility] Social Worker Direct Dial: [phone]  Fax: [phone]

## 2023-02-03 NOTE — Patient Instructions (Signed)
Visit Information  Thank you for taking time to visit with me today. Please don't hesitate to contact me if I can be of assistance to you.   Following are the goals we discussed today:   Goals Addressed             This Visit's Progress    Care Coordinator Activities       Care Coordination Interventions: Patient stated that her insurance [employer] will no longer cover her life alert, SW discussed with the patient abut the program in [place] and the patient declines the information and stated that she could not pay for anything else and that she will just go without it and speak with her PCP about it. Patient stated that she is in need of transportation and it was in her plans to contact [facility] to get her medical transportation in place. SW discussed the SCAT application and process the patient stated that she wanted to try to get the transportation through [facility] and if all else fails then she will have the SW assist her with applying for SCAT The Sw completed the SDOH questions and no other concerns or issues at this time. The SW will follow up with the patient on 02/20/2023 at 10:45 am.        Our next appointment is by telephone on 02/20/2023 at 10:45 am  Please call the care guide team at [phone] if you need to cancel or reschedule your appointment.   If you are experiencing a Mental Health or Behavioral Health Crisis or need someone to talk to, please call the Suicide and Crisis Lifeline: 988 go to [facility] [address] [place] [phone]) call 911  The patient verbalized understanding of instructions, educational materials, and care plan provided today and DECLINED offer to receive copy of patient instructions, educational materials, and care plan.   Jeanie Cooks, PhD [facility], [facility] Social Worker Direct Dial: [phone]  Fax: [phone]

## 2023-02-06 ENCOUNTER — Ambulatory Visit: Payer: Self-pay

## 2023-02-07 NOTE — Patient Outreach (Signed)
  Care Coordination   Follow Up Visit Note   02/06/2023 Name: Sandra Brennan MRN: [id number] DOB: 1945/06/20  Sandra Brennan is a 77 y.o. year old female who sees Nche, Bonna Gains, NP for primary care. I spoke with  Sandra Brennan by phone today.  What matters to the patients health and wellness today?  Sandra Brennan reports ongoing swelling in her feet and legs. She has not yet had an appointment with the GI specialist, but she mentioned that her diarrhea has improved and is not as severe as it was before. She is still waiting to receive her motorized wheelchair. Although she has a blood pressure monitor, she has not checked her blood pressure recently. She acknowledged that she should start monitoring it again and stated that she would begin doing so . Additionally, she confirmed that she is taking her medications as prescribed.       Goals Addressed             This Visit's Progress    I want to manage my blood pressure and fall          Patient Goals/Self Care Activities: -Patient/Caregiver will take medications as prescribed   -Patient/Caregiver will attend all scheduled provider appointments -Patient/Caregiver will call pharmacy for medication refills 3-7 days in advance of running out of medications -Patient/Caregiver will call provider office for new concerns or questions  -Patient/Caregiver will focus on medication adherence by taking medications as prescribed   -Calls provider office for new concerns, questions, or BP outside discussed parameters -Checks BP and records as discussed -Start back checking her blood pressure -Stay hydrated -Eat three meals a day BP Readings from Last 3 Encounters:  01/15/23 136/74  01/05/23 132/81  11/05/22 118/74  -Utilize (assistive device) appropriately with all ambulation -De-clutter walkways -Utilize home lighting for dim lit areas         SDOH assessments and interventions completed:  No     Care Coordination  Interventions:  Yes, provided   Juanell Fairly RN, BSN, [facility] [place]  [facility], [facility] Health  Care Coordinator Phone: [phone]      Follow up plan: Follow up call scheduled for 03/10/2023  1115 am    Encounter Outcome:  Patient Visit Completed   Juanell Fairly RN, BSN, [facility]   [facility], [facility] Health  Care Coordinator Phone: [phone]

## 2023-02-07 NOTE — Patient Instructions (Signed)
Visit Information  Thank you for taking time to visit with me today. Please don't hesitate to contact me if I can be of assistance to you.   Following are the goals we discussed today:   Goals Addressed             This Visit's Progress    I want to manage my blood pressure and fall          Patient Goals/Self Care Activities: -Patient/Caregiver will take medications as prescribed   -Patient/Caregiver will attend all scheduled provider appointments -Patient/Caregiver will call pharmacy for medication refills 3-7 days in advance of running out of medications -Patient/Caregiver will call provider office for new concerns or questions  -Patient/Caregiver will focus on medication adherence by taking medications as prescribed   -Calls provider office for new concerns, questions, or BP outside discussed parameters -Checks BP and records as discussed -Start back checking her blood pressure -Stay hydrated -Eat three meals a day BP Readings from Last 3 Encounters:  01/15/23 136/74  01/05/23 132/81  11/05/22 118/74  -Utilize (assistive device) appropriately with all ambulation -De-clutter walkways -Utilize home lighting for dim lit areas         Our next appointment is by telephone on 03/10/23  at 1115 am  Please call the care guide team at [phone] if you need to cancel or reschedule your appointment.   If you are experiencing a Mental Health or Behavioral Health Crisis or need someone to talk to, please call 1-800-273-TALK (toll free, 24 hour hotline)  The patient verbalized understanding of instructions, educational materials, and care plan provided today.    Juanell Fairly RN, BSN, [facility]  [place], [facility] Health  Care Coordinator Phone: [phone]

## 2023-02-11 ENCOUNTER — Other Ambulatory Visit: Payer: Self-pay | Admitting: Nurse Practitioner

## 2023-02-11 DIAGNOSIS — F339 Major depressive disorder, recurrent, unspecified: Secondary | ICD-10-CM

## 2023-02-11 DIAGNOSIS — L304 Erythema intertrigo: Secondary | ICD-10-CM

## 2023-02-11 DIAGNOSIS — I10 Essential (primary) hypertension: Secondary | ICD-10-CM

## 2023-02-11 DIAGNOSIS — K2100 Gastro-esophageal reflux disease with esophagitis, without bleeding: Secondary | ICD-10-CM

## 2023-02-20 ENCOUNTER — Ambulatory Visit: Payer: Self-pay | Admitting: Licensed Clinical Social Worker

## 2023-02-20 NOTE — Patient Instructions (Signed)
Visit Information  Thank you for taking time to visit with me today. Please don't hesitate to contact me if I can be of assistance to you.   Following are the goals we discussed today:   Goals Addressed             This Visit's Progress    Care Coordinator Activities       Care Coordination Interventions: Patient stated that she called [facility] and was told again that they will not cover the life alert system. The patient will be sending it back by the end of the month. Patient stated that she is going to talk to her 3 children about covering the monthly cost, because they have already asked her why did she not tell them and that they could have assisted her.  Patient stated that she is considering allowing her daughter to move in with her or she may go and live with her son in [place].  Patient stated she relies on family, friends and her church for transportation but her PCP did provide the SCAT application to her and she is considering completing the paperwork to take less stress off of others. And she is glad of the support that she has with her family, friends and her church. Patient also stated that she talked to her insurance [facility]  and Medicaid and that they will provided transportation to her medical appointments and that she just has to call back to schedule, but her next in office appointments are not until May 2025. Patient stated that she received a free scooter about 6 months ago and enjoys it and will be receiving a motorized wheelchair on 03/02/2023. Patient stated that she is good on food and uses her U-card to get food along with friends and family assistance. The Sw completed the SDOH questions and no other concerns or issues at this time. SW reminded patient about her upcoming appointment with the Care Coordination Team RN that has been changed form 03/10/2023/ to 03/05/2023, patient was ok with the change in the appointment.  The SW will follow up with the patient on 03/12/2023 at  11:30 am        Our next appointment is by telephone on 03/12/2023 at 11:30 am  Please call the care guide team at [phone] if you need to cancel or reschedule your appointment.   If you are experiencing a Mental Health or Behavioral Health Crisis or need someone to talk to, please call the Suicide and Crisis Lifeline: 988 go to [facility] [address] [place] [phone]) call 911  The patient verbalized understanding of instructions, educational materials, and care plan provided today and DECLINED offer to receive copy of patient instructions, educational materials, and care plan.   Jeanie Cooks, PhD [facility], [facility] Social Worker Direct Dial: [phone]  Fax: [phone]

## 2023-02-20 NOTE — Patient Outreach (Signed)
  Care Coordination   Initial Visit Note   02/20/2023 Name: Sandra Brennan MRN: [id number] DOB: 1945-07-20  Sandra Brennan is a 77 y.o. year old female who sees Nche, Bonna Gains, NP for primary care. I spoke with  Sandra Brennan by phone today.  What matters to the patients health and wellness today?  Transportation and Life Alert    Goals Addressed             This Visit's Progress    Care Coordinator Activities       Care Coordination Interventions: Patient stated that she called [employer] and was told again that they will not cover the life alert system. The patient will be sending it back by the end of the month. Patient stated that she is going to talk to her 3 children about covering the monthly cost, because they have already asked her why did she not tell them and that they could have assisted her.  Patient stated that she is considering allowing her daughter to move in with her or she may go and live with her son in [place].  Patient stated she relies on family, friends and her church for transportation but her PCP did provide the SCAT application to her and she is considering completing the paperwork to take less stress off of others. And she is glad of the support that she has with her family, friends and her church. Patient also stated that she talked to her insurance [facility]  and Medicaid and that they will provided transportation to her medical appointments and that she just has to call back to schedule, but her next in office appointments are not until May 2025. Patient stated that she received a free scooter about 6 months ago and enjoys it and will be receiving a motorized wheelchair on 03/02/2023. Patient stated that she is good on food and uses her U-card to get food along with friends and family assistance. The Sw completed the SDOH questions and no other concerns or issues at this time. SW reminded patient about her upcoming appointment with the Care Coordination Team RN  that has been changed form 03/10/2023/ to 03/05/2023, patient was ok with the change in the appointment.  The SW will follow up with the patient on 03/12/2023 at 11:30 am        SDOH assessments and interventions completed:  Yes  SDOH Interventions Today    Flowsheet Row Most Recent Value  SDOH Interventions   Food Insecurity Interventions Intervention Not Indicated  Housing Interventions Intervention Not Indicated  Transportation Interventions Intervention Not Indicated  [Still will apply for the SCAT and her insurance will pay for transportation]  Utilities Interventions Intervention Not Indicated        Care Coordination Interventions:  Yes, provided  Interventions Today    Flowsheet Row Most Recent Value  General Interventions   General Interventions Discussed/Reviewed General Interventions Reviewed, [employer] will talk to her children about covering the monthly bill for life alert, currently she has a pull cord in her bathroom if she were to fall and it will sound an alert. Patient is going to apply for SCAT, PCP provided the paperwork.]        Follow up plan: Follow up call scheduled for 03/12/2023 at 11:30 am    Encounter Outcome:  Patient Visit Completed   Jeanie Cooks, PhD [facility] [place], [facility] Social Worker Direct Dial: [phone]  Fax: [phone]

## 2023-02-22 DIAGNOSIS — G4733 Obstructive sleep apnea (adult) (pediatric): Secondary | ICD-10-CM | POA: Diagnosis not present

## 2023-02-23 DIAGNOSIS — G4733 Obstructive sleep apnea (adult) (pediatric): Secondary | ICD-10-CM | POA: Diagnosis not present

## 2023-03-01 DIAGNOSIS — M1A00X0 Idiopathic chronic gout, unspecified site, without tophus (tophi): Secondary | ICD-10-CM | POA: Diagnosis not present

## 2023-03-01 DIAGNOSIS — M6281 Muscle weakness (generalized): Secondary | ICD-10-CM | POA: Diagnosis not present

## 2023-03-01 DIAGNOSIS — R2689 Other abnormalities of gait and mobility: Secondary | ICD-10-CM | POA: Diagnosis not present

## 2023-03-01 DIAGNOSIS — J449 Chronic obstructive pulmonary disease, unspecified: Secondary | ICD-10-CM | POA: Diagnosis not present

## 2023-03-01 DIAGNOSIS — I5032 Chronic diastolic (congestive) heart failure: Secondary | ICD-10-CM | POA: Diagnosis not present

## 2023-03-02 DIAGNOSIS — M48061 Spinal stenosis, lumbar region without neurogenic claudication: Secondary | ICD-10-CM | POA: Diagnosis not present

## 2023-03-02 DIAGNOSIS — R269 Unspecified abnormalities of gait and mobility: Secondary | ICD-10-CM | POA: Diagnosis not present

## 2023-03-05 ENCOUNTER — Ambulatory Visit: Payer: Self-pay

## 2023-03-05 NOTE — Patient Instructions (Signed)
 Visit Information  Thank you for taking time to visit with me today. Please don't hesitate to contact me if I can be of assistance to you.   Following are the goals we discussed today:   Goals Addressed             This Visit's Progress    I want to manage my blood pressure and fall          Patient Goals/Self Care Activities: -Patient/Caregiver will take medications as prescribed   -Patient/Caregiver will attend all scheduled provider appointments -Patient/Caregiver will call pharmacy for medication refills 3-7 days in advance of running out of medications -Patient/Caregiver will call provider office for new concerns or questions  -Patient/Caregiver will focus on medication adherence by taking medications as prescribed   -Calls provider office for new concerns, questions, or BP outside discussed parameters -Checks BP and records as discussed -Start back checking her blood pressure -Stay hydrated -Eat three meals a day BP Readings from Last 3 Encounters:  01/15/23 136/74  01/05/23 132/81  11/05/22 118/74  -Utilize (assistive device) appropriately with all ambulation -De-clutter walkways -Utilize home lighting for dim lit areas -exerciser your legs         Our next appointment is by telephone on 04/09/23 at 11 am  Please call the care guide team at [phone] if you need to cancel or reschedule your appointment.   If you are experiencing a Mental Health or Behavioral Health Crisis or need someone to talk to, please call 1-800-273-TALK (toll free, 24 hour hotline)  The patient verbalized understanding of instructions, educational materials, and care plan provided today.   Wilbert Diver RN, BSN, [facility]  [facility], [facility] Health  Care Coordinator Phone: [phone]

## 2023-03-05 NOTE — Patient Outreach (Signed)
  Care Coordination   Follow Up Visit Note   03/05/2023 Name: Sandra Brennan MRN: [id number] DOB: [birth year]  Sandra Brennan is a 78 y.o. year old female who sees Nche, Roselie [place], NP for primary care. I spoke with  Sandra Brennan by phone today.  What matters to the patients health and wellness today?  I spoke with Sandra Brennan today. She reported experiencing fatigue and back discomfort. She has received her electric wheelchair and is learning to use it, as well as monitor her blood pressure, which was recorded at 141/70. Although she has not yet heard about her gastroenterology appointment, she noted decreased swelling in her right lower extremity due to her use of Lasix . She continues to experience pain on the right side of her hip and finds it difficult to stand for long periods, requiring frequent breaks. Nevertheless, she remains active in church activities. I advised her to exercise her legs to prevent weakness, and she mentioned using her walker to move around her home. Additionally, Sandra Brennan is interested in starting water aerobics and is regularly monitoring her blood pressure.    Goals Addressed             This Visit's Progress    I want to manage my blood pressure and fall          Patient Goals/Self Care Activities: -Patient/Caregiver will take medications as prescribed   -Patient/Caregiver will attend all scheduled provider appointments -Patient/Caregiver will call pharmacy for medication refills 3-7 days in advance of running out of medications -Patient/Caregiver will call provider office for new concerns or questions  -Patient/Caregiver will focus on medication adherence by taking medications as prescribed   -Calls provider office for new concerns, questions, or BP outside discussed parameters -Checks BP and records as discussed -Start back checking her blood pressure -Stay hydrated -Eat three meals a day BP Readings from Last 3 Encounters:  01/15/23 136/74   01/05/23 132/81  11/05/22 118/74  -Utilize (assistive device) appropriately with all ambulation -De-clutter walkways -Utilize home lighting for dim lit areas -exerciser your legs         SDOH assessments and interventions completed:  No     Care Coordination Interventions:  Yes, provided   Interventions Today    Flowsheet Row Most Recent Value  Chronic Disease   Chronic disease during today's visit Hypertension (HTN)  General Interventions   General Interventions Discussed/Reviewed General Interventions Discussed  Exercise Interventions   Exercise Discussed/Reviewed Exercise Discussed  Pharmacy Interventions   Pharmacy Dicussed/Reviewed Pharmacy Topics Discussed  Safety Interventions   Safety Discussed/Reviewed Safety Discussed, Fall Risk        Follow up plan: Follow up call scheduled for 04/09/23  11 am    Encounter Outcome:  Patient Visit Completed   Wilbert Diver RN, BSN, [facility]  [facility], [facility] Health  Care Coordinator Phone: [phone]

## 2023-03-12 ENCOUNTER — Ambulatory Visit: Payer: Self-pay | Admitting: Licensed Clinical Social Worker

## 2023-03-12 NOTE — Patient Instructions (Signed)
 Visit Information  Thank you for taking time to visit with me today. Please don't hesitate to contact me if I can be of assistance to you.   Following are the goals we discussed today:   Goals Addressed   None     Our next appointment is by telephone on 03/26/2023 at 11:00 am  Please call the care guide team at [phone] if you need to cancel or reschedule your appointment.   If you are experiencing a Mental Health or Behavioral Health Crisis or need someone to talk to, please call the Suicide and Crisis Lifeline: 988 go to [facility] [address], [place] [phone]) call 911  The patient verbalized understanding of instructions, educational materials, and care plan provided today and DECLINED offer to receive copy of patient instructions, educational materials, and care plan.   Tobias CHARM Maranda HEDWIG, PhD [facility], [facility] Social Worker Direct Dial: [phone]  Fax: [phone]

## 2023-03-12 NOTE — Patient Outreach (Signed)
  Care Coordination   Follow Up Visit Note   03/12/2023 Name: [name] MRN: [id number] DOB: 1946/02/05  Sandra Brennan is a 78 y.o. year old female who sees Nche, Roselie Rockford, NP for primary care. I spoke with  Sandra Brennan by phone today.  What matters to the patients health and wellness today?  Life Alert and SCAT    Goals Addressed   None     SDOH assessments and interventions completed:  Yes  SDOH Interventions Today    Flowsheet Row Most Recent Value  SDOH Interventions   Food Insecurity Interventions Intervention Not Indicated  Housing Interventions Intervention Not Indicated  Transportation Interventions SCAT ([employer]), Other (Comment)  [Patient wants the SCAT application mailed to her]  Utilities Interventions Intervention Not Indicated        Care Coordination Interventions:  Yes, provided   Follow up plan: Follow up call scheduled for 03/26/2023 at 11:00 am    Encounter Outcome:  Patient Visit Completed   Sandra CHARM Maranda HEDWIG, PhD [facility], [facility] Social Worker Direct Dial: [phone]  Fax: [phone]

## 2023-03-13 ENCOUNTER — Other Ambulatory Visit: Payer: Self-pay | Admitting: Nurse Practitioner

## 2023-03-13 DIAGNOSIS — L304 Erythema intertrigo: Secondary | ICD-10-CM

## 2023-03-13 DIAGNOSIS — I10 Essential (primary) hypertension: Secondary | ICD-10-CM

## 2023-03-13 DIAGNOSIS — F339 Major depressive disorder, recurrent, unspecified: Secondary | ICD-10-CM

## 2023-03-26 ENCOUNTER — Ambulatory Visit: Payer: Self-pay | Admitting: Licensed Clinical Social Worker

## 2023-03-26 NOTE — Patient Outreach (Signed)
  Care Coordination   03/26/2023 Name: Sandra Brennan MRN: [id number] DOB: 1945/06/19   Care Coordination Outreach Attempts:  An unsuccessful outreach was attempted for an appointment today.  Follow Up Plan:  Additional outreach attempts will be made to offer the patient complex care management information and services.   Encounter Outcome:  No Answer   Care Coordination Interventions:  No, not indicated   This was the 2nd attempted follow up call to patient   Jeanie Cooks, PhD [facility], [facility] Social Worker Direct Dial: [phone]  Fax: [phone]

## 2023-03-27 ENCOUNTER — Encounter: Payer: Self-pay | Admitting: Nurse Practitioner

## 2023-04-06 ENCOUNTER — Encounter: Payer: Self-pay | Admitting: Physician Assistant

## 2023-04-08 ENCOUNTER — Ambulatory Visit: Payer: Self-pay | Admitting: Licensed Clinical Social Worker

## 2023-04-08 NOTE — Patient Outreach (Signed)
  Care Coordination   04/08/2023 Name: CIRE DEYARMIN MRN: [id number] DOB: August 16, 1945   Care Coordination Outreach Attempts:  An unsuccessful outreach was attempted for an appointment today.  Follow Up Plan:  No further outreach attempts will be made at this time. We have been unable to contact the patient to offer or enroll patient in complex care management services.  Encounter Outcome:  No Answer   Care Coordination Interventions:  No, not indicated     This was a 3rd attempt follow up and the Sw will close the case   Tobias CHARM Maranda HEDWIG, PhD [facility], [facility] Social Worker Direct Dial: [phone]  Fax: [phone]

## 2023-04-15 ENCOUNTER — Telehealth: Payer: Self-pay | Admitting: *Deleted

## 2023-04-15 NOTE — Progress Notes (Signed)
Complex Care Management Care Guide Note  04/15/2023 Name: Sandra Brennan MRN: [id number] DOB: December 24, 1945  Sandra Brennan is a 78 y.o. year old female who is a primary care patient of Nche, Bonna Gains, NP and is actively engaged with the care management team. I reached out to Sandra Brennan by phone today to assist with re-scheduling  with the BSW.  Follow up plan: Unsuccessful telephone outreach attempt made. Gwenevere Ghazi  [place]  [facility], [facility] Guide  Direct Dial: [phone]  Fax [phone]

## 2023-04-20 ENCOUNTER — Other Ambulatory Visit: Payer: Self-pay | Admitting: Nurse Practitioner

## 2023-04-20 DIAGNOSIS — L304 Erythema intertrigo: Secondary | ICD-10-CM

## 2023-04-20 DIAGNOSIS — E1142 Type 2 diabetes mellitus with diabetic polyneuropathy: Secondary | ICD-10-CM

## 2023-04-20 DIAGNOSIS — I10 Essential (primary) hypertension: Secondary | ICD-10-CM

## 2023-04-20 DIAGNOSIS — E66813 Obesity, class 3: Secondary | ICD-10-CM

## 2023-04-21 NOTE — Telephone Encounter (Signed)
Called patient to clarify RX refill request for Ozempic. Patient didn't answer and was unable to leave VM; 2nd attempt will be made before end of the day.

## 2023-04-21 NOTE — Telephone Encounter (Signed)
Spoke to patient regarding Ozempic. PT stated it was a error by pharmacy; they weren't unaware RX was filled in November on her last OV; no concerns or questions at this time; she is doing well.

## 2023-04-21 NOTE — Telephone Encounter (Signed)
Copied from CRM [id number]. Topic: General - Call Back - No Documentation >> Apr 21, 2023  1:21 PM Irine Seal wrote: Reason for CRM: pt returning missed call, looking at the notes It seems it was from [facility] CMA calling in regards to the patients ozempic, patient stated she's not sure what's going on with it after asking to clarify the reason for the refill request. Pt callback [phone]

## 2023-04-22 ENCOUNTER — Encounter: Payer: 59 | Admitting: Licensed Clinical Social Worker

## 2023-04-23 NOTE — Progress Notes (Signed)
Complex Care Management Care Guide Note  04/23/2023 Name: Sandra Brennan MRN: [id number] DOB: [birth year]  Sandra Brennan is a 78 y.o. year old female who is a primary care patient of Nche, Bonna Gains, NP and is actively engaged with the care management team. I reached out to Sandra Brennan by phone today to assist with re-scheduling  with the BSW.  Follow up plan: Telephone appointment with complex care management team member scheduled for:  2/25  Sandra Brennan  [facility]  [facility], [facility] Guide  Direct Dial: [phone]  Fax [phone]

## 2023-04-27 LAB — HM MAMMOGRAPHY

## 2023-04-28 ENCOUNTER — Ambulatory Visit: Payer: Self-pay | Admitting: Licensed Clinical Social Worker

## 2023-04-28 NOTE — Patient Instructions (Signed)
 Visit Information  Thank you for taking time to visit with me today. Please don't hesitate to contact me if I can be of assistance to you.   Following are the goals we discussed today:   Goals Addressed             This Visit's Progress    Care Coordinator Activities   On track    Care Coordination Interventions: Patient stated that she called [facility] and was told again that they will not cover the life alert system. The patient will be sending it back by the end of the month. Patient stated that she is going to talk to her 3 children about covering the monthly cost, because they have already asked her why did she not tell them and that they could have assisted her.  Patient stated that she is considering allowing her daughter to move in with her or she may go and live with her son in [place].  Patient stated she relies on family, friends and her church for transportation but her PCP did provide the SCAT application to her and she is considering completing the paperwork to take less stress off of others. And she is glad of the support that she has with her family, friends and her church. Patient also stated that she talked to her insurance [employer]  and Medicaid and that they will provided transportation to her medical appointments and that she just has to call back to schedule, but her next in office appointments are not until May 2025. Patient stated that she received a free scooter about 6 months ago and enjoys it and will be receiving a motorized wheelchair on 03/02/2023. Patient stated that she is good on food and uses her U-card to get food along with friends and family assistance. The Sw completed the SDOH questions and no other concerns or issues at this time. SW reminded patient about her upcoming appointment with the Care Coordination Team RN that has been changed form 03/10/2023/ to 03/05/2023, patient was ok with the change in the appointment.  The SW will follow up with the patient on  05/15/2023 at 1:00 am        Our next appointment is by telephone on 05/15/2023 at 1:00 pm  Please call the care guide team at [phone] if you need to cancel or reschedule your appointment.   If you are experiencing a Mental Health or Behavioral Health Crisis or need someone to talk to, please call the Suicide and Crisis Lifeline: 988 go to [facility] [address] [place] [phone]) call 911  The patient verbalized understanding of instructions, educational materials, and care plan provided today and DECLINED offer to receive copy of patient instructions, educational materials, and care plan.   Jeanie Cooks, PhD [facility], [facility] Social Worker Direct Dial: [phone]  Fax: [phone]

## 2023-04-28 NOTE — Patient Outreach (Signed)
 Care Coordination   Follow Up Visit Note   04/28/2023 Name: Sandra Brennan MRN: [id number] DOB: 04/07/1945  Sandra Brennan is a 78 y.o. year old female who sees Nche, Bonna Gains, NP for primary care. I spoke with  Sandra Mikes Old by phone today.  What matters to the patients health and wellness today?  Transportation     Goals Addressed             This Visit's Progress    Care Coordinator Activities   On track    Care Coordination Interventions: Patient stated that she called [employer] and was told again that they will not cover the life alert system. The patient will be sending it back by the end of the month. Patient stated that she is going to talk to her 3 children about covering the monthly cost, because they have already asked her why did she not tell them and that they could have assisted her.  Patient stated that she is considering allowing her daughter to move in with her or she may go and live with her son in [place].  Patient stated she relies on family, friends and her church for transportation but her PCP did provide the SCAT application to her and she is considering completing the paperwork to take less stress off of others. And she is glad of the support that she has with her family, friends and her church. Patient also stated that she talked to her insurance [facility]  and Medicaid and that they will provided transportation to her medical appointments and that she just has to call back to schedule, but her next in office appointments are not until May 2025. Patient stated that she received a free scooter about 6 months ago and enjoys it and will be receiving a motorized wheelchair on 03/02/2023. Patient stated that she is good on food and uses her U-card to get food along with friends and family assistance. The Sw completed the SDOH questions and no other concerns or issues at this time. SW reminded patient about her upcoming appointment with the Care Coordination Team RN that  has been changed form 03/10/2023/ to 03/05/2023, patient was ok with the change in the appointment.  The SW will follow up with the patient on 05/15/2023 at 1:00 am        SDOH assessments and interventions completed:  Yes  SDOH Interventions Today    Flowsheet Row Most Recent Value  SDOH Interventions   Food Insecurity Interventions Intervention Not Indicated  Housing Interventions Intervention Not Indicated  Transportation Interventions Intervention Not Indicated  Utilities Interventions Intervention Not Indicated        Care Coordination Interventions:  Yes, provided  Interventions Today    Flowsheet Row Most Recent Value  General Interventions   General Interventions Discussed/Reviewed General Interventions Reviewed  [Patient is going to complete the SCAT applicationon her own and SW will follow up]        Follow up plan: Follow up call scheduled for 05/15/2023 at 1:00 pm    Encounter Outcome:  Patient Visit Completed   Jeanie Cooks, PhD [facility], [facility] Social Worker Direct Dial: [phone]  Fax: [phone]

## 2023-04-30 ENCOUNTER — Encounter: Payer: Self-pay | Admitting: Nurse Practitioner

## 2023-05-01 ENCOUNTER — Other Ambulatory Visit: Payer: Self-pay | Admitting: Nurse Practitioner

## 2023-05-01 DIAGNOSIS — I10 Essential (primary) hypertension: Secondary | ICD-10-CM

## 2023-05-05 ENCOUNTER — Ambulatory Visit: Payer: Self-pay

## 2023-05-05 NOTE — Patient Instructions (Signed)
 Visit Information  Thank you for taking time to visit with me today. Please don't hesitate to contact me if I can be of assistance to you.   Following are the goals we discussed today:   Goals Addressed             This Visit's Progress    CCM Expected Outcome:  Monitor, Self-Manage and Reduce Symptoms of:       I want to manage my blood pressure and fall          Patient Goals/Self Care Activities: -Patient/Caregiver will take medications as prescribed   -Patient/Caregiver will attend all scheduled provider appointments -Patient/Caregiver will call pharmacy for medication refills 3-7 days in advance of running out of medications -Patient/Caregiver will call provider office for new concerns or questions  -Patient/Caregiver will focus on medication adherence by taking medications as prescribed   -Calls provider office for new concerns, questions, or BP outside discussed parameters -Checks BP and records as discussed -Start back checking her blood pressure -Stay hydrated -Eat three meals a day BP Readings from Last 3 Encounters:  01/15/23 136/74  01/05/23 132/81  11/05/22 118/74  -Utilize (assistive device) appropriately with all ambulation -De-clutter walkways -Utilize home lighting for dim lit areas -exerciser your legs         Our next appointment is by telephone on 06/04/23 at 115  pm  Please call the care guide team at [phone] if you need to cancel or reschedule your appointment.   If you are experiencing a Mental Health or Behavioral Health Crisis or need someone to talk to, please call 1-800-273-TALK (toll free, 24 hour hotline)  Patient verbalizes understanding of instructions and care plan provided today.    Juanell Fairly RN, BSN, [facility]  [facility], [facility] Health  Care Coordinator Phone: [phone]

## 2023-05-05 NOTE — Patient Outreach (Signed)
 Care Coordination   Follow Up Visit Note   05/05/2023 Name: Sandra Brennan MRN: [id number] DOB: February 26, 1946  Sandra Brennan is a 78 y.o. year old female who sees Sandra Brennan, Sandra Gains, NP for primary care. I spoke with  Sandra Brennan by phone today.  What matters to the patients health and wellness today?  I recently had a conversation with Sandra Brennan, who reported that her blood pressure remains stable at 115/72. She expressed concern about her diminished appetite, stating that she consumes only small portions of food, which leads her to suspect potential weight loss, although she has not weighed herself recently. She is hydrating with Gatorade and water and consumes Boost twice daily. I recommended that she enhance her diet by incorporating more protein, fruits, and vegetables, proposing a plan of six small meals each day. We reviewed various dietary options, including different types of meat, nuts, yogurt, fish, and protein smoothies, and she expressed a willingness to explore these alternatives.  Additionally, we addressed the necessity for her to register for MyChart, to which she indicated she would seek assistance from her daughter. Sandra Brennan also mentioned her diagnosis of glaucoma, accompanied by sharp eye pain, but she is uncertain about which clinic to consult. I advised her to contact her insurance provider to obtain information regarding eye clinics that accept her insurance, emphasizing the importance of addressing this matter promptly. She agreed to take the necessary steps to follow up accordingly.         Goals Addressed             This Visit's Progress    CCM Expected Outcome:  Monitor, Self-Manage and Reduce Symptoms of:       I want to manage my blood pressure and fall          Patient Goals/Self Care Activities: -Patient/Caregiver will take medications as prescribed   -Patient/Caregiver will attend all scheduled provider appointments -Patient/Caregiver will call  pharmacy for medication refills 3-7 days in advance of running out of medications -Patient/Caregiver will call provider office for new concerns or questions  -Patient/Caregiver will focus on medication adherence by taking medications as prescribed   -Calls provider office for new concerns, questions, or BP outside discussed parameters -Checks BP and records as discussed -Start back checking her blood pressure -Stay hydrated -Eat three meals a day BP Readings from Last 3 Encounters:  01/15/23 136/74  01/05/23 132/81  11/05/22 118/74  -Utilize (assistive device) appropriately with all ambulation -De-clutter walkways -Utilize home lighting for dim lit areas -exerciser your legs         SDOH assessments and interventions completed:  No     Care Coordination Interventions:  Yes, provided   Interventions Today    Flowsheet Row Most Recent Value  Chronic Disease   Chronic disease during today's visit Hypertension (HTN)  General Interventions   General Interventions Discussed/Reviewed General Interventions Discussed, General Interventions Reviewed  Education Interventions   Education Provided Provided Education  Provided Verbal Education On Nutrition  Nutrition Interventions   Nutrition Discussed/Reviewed Nutrition Discussed, Portion sizes, Supplemental nutrition, Adding fruits and vegetables, Increasing proteins  Pharmacy Interventions   Pharmacy Dicussed/Reviewed Pharmacy Topics Discussed  Safety Interventions   Safety Discussed/Reviewed Safety Discussed        Follow up plan: Follow up call scheduled for 06/04/23  115 pm    Encounter Outcome:  Patient Visit Completed   Juanell Fairly RN, BSN, [facility]  [facility], [facility]  Coordinator Phone: [phone]

## 2023-05-14 ENCOUNTER — Ambulatory Visit (INDEPENDENT_AMBULATORY_CARE_PROVIDER_SITE_OTHER): Payer: 59 | Admitting: Physician Assistant

## 2023-05-14 ENCOUNTER — Ambulatory Visit
Admission: RE | Admit: 2023-05-14 | Discharge: 2023-05-14 | Disposition: A | Discharge status: Home / Self Care | Source: Ambulatory Visit | Attending: Physician Assistant | Admitting: Physician Assistant

## 2023-05-14 ENCOUNTER — Encounter: Payer: Self-pay | Admitting: Physician Assistant

## 2023-05-14 VITALS — BP 128/74 | HR 78 | Ht 60.0 in | Wt 260.0 lb

## 2023-05-14 DIAGNOSIS — R32 Unspecified urinary incontinence: Secondary | ICD-10-CM | POA: Diagnosis not present

## 2023-05-14 DIAGNOSIS — R197 Diarrhea, unspecified: Secondary | ICD-10-CM

## 2023-05-14 DIAGNOSIS — Z7985 Long-term (current) use of injectable non-insulin antidiabetic drugs: Secondary | ICD-10-CM

## 2023-05-14 DIAGNOSIS — R150 Incomplete defecation: Secondary | ICD-10-CM

## 2023-05-14 DIAGNOSIS — R109 Unspecified abdominal pain: Secondary | ICD-10-CM

## 2023-05-14 DIAGNOSIS — N1832 Chronic kidney disease, stage 3b: Secondary | ICD-10-CM

## 2023-05-14 DIAGNOSIS — E1142 Type 2 diabetes mellitus with diabetic polyneuropathy: Secondary | ICD-10-CM

## 2023-05-14 DIAGNOSIS — G4733 Obstructive sleep apnea (adult) (pediatric): Secondary | ICD-10-CM

## 2023-05-14 DIAGNOSIS — Z6841 Body Mass Index (BMI) 40.0 and over, adult: Secondary | ICD-10-CM

## 2023-05-14 NOTE — Progress Notes (Signed)
 Agree with assessment/plan.  Edman Circle, MD [facility] [phone]

## 2023-05-14 NOTE — Patient Instructions (Addendum)
 Your provider has requested that you have an abdominal x ray before leaving today. Please go to the basement floor to our Radiology department for the test.  Miralax is an osmotic laxative.  It only brings more water into the stool.  This is safe to take daily.  Can take up to 17 gram of miralax twice a day.  Mix with juice or coffee.  Start 1 capful at night for 3-4 days and reassess your response in 3-4 days.  You can increase and decrease the dose based on your response.  Remember, it can take up to 3-4 days to take effect OR for the effects to wear off.   I often pair this with benefiber in the morning to help assure the stool is not too loose.   Here some information about pelvic floor dysfunction. This may be contributing to some of your symptoms. We will continue with our evaluation but I do want you to consider adding on fiber supplement with low-dose MiraLAX daily. We could also refer to pelvic floor physical therapy.   Pelvic Floor Dysfunction, Female Pelvic floor dysfunction (PFD) is a condition that results when the group of muscles and connective tissues that support the organs in the pelvis (pelvic floor muscles) do not work well. These muscles and their connections form a sling that supports the colon and bladder. In women, they also support the uterus. PFD causes pelvic floor muscles to be too weak, too tight, or both. In PFD, muscle movements are not coordinated. This may cause bowel or bladder problems. It may also cause pain. What are the causes? This condition may be caused by an injury to the pelvic area or by a weakening of pelvic muscles. This often results from pregnancy and childbirth or other types of strain. In many cases, the exact cause is not known. What increases the risk? The following factors may make you more likely to develop this condition: Having chronic bladder tissue inflammation (interstitial cystitis). Being an older person. Being  overweight. History of radiation treatment for cancer in the pelvic region. Previous pelvic surgery, such as removal of the uterus (hysterectomy). What are the signs or symptoms? Symptoms of this condition vary and may include: Bladder symptoms, such as: Trouble starting urination and emptying the bladder. Frequent urinary tract infections. Leaking urine when coughing, laughing, or exercising (stress incontinence). Having to pass urine urgently or frequently. Pain when passing urine. Bowel symptoms, such as: Constipation. Urgent or frequent bowel movements. Incomplete bowel movements. Painful bowel movements. Leaking stool or gas. Unexplained genital or rectal pain. Genital or rectal muscle spasms. Low back pain. Other symptoms may include: A heavy, full, or aching feeling in the vagina. A bulge that protrudes into the vagina. Pain during or after sex. How is this diagnosed? This condition may be diagnosed based on: Your symptoms and medical history. A physical exam. During the exam, your health care provider may check your pelvic muscles for tightness, spasm, pain, or weakness. This may include a rectal exam and a pelvic exam. In some cases, you may have diagnostic tests, such as: Electrical muscle function tests. Urine flow testing. X-ray tests of bowel function. Ultrasound of the pelvic organs. How is this treated? Treatment for this condition depends on the symptoms. Treatment options include: Physical therapy. This may include Kegel exercises to help relax or strengthen the pelvic floor muscles. Biofeedback. This type of therapy provides feedback on how tight your pelvic floor muscles are so that you can learn to control  them. Internal or external massage therapy. A treatment that involves electrical stimulation of the pelvic floor muscles to help control pain (transcutaneous electrical nerve stimulation, or TENS). Sound wave therapy (ultrasound) to reduce muscle  spasms. Medicines, such as: Muscle relaxants. Bladder control medicines. Surgery to reconstruct or support pelvic floor muscles may be an option if other treatments do not help. Follow these instructions at home: Activity Do your usual activities as told by your health care provider. Ask your health care provider if you should modify any activities. Do pelvic floor strengthening or relaxing exercises at home as told by your physical therapist. Lifestyle Maintain a healthy weight. Eat foods that are high in fiber, such as beans, whole grains, and fresh fruits and vegetables. Limit foods that are high in fat and processed sugars, such as fried or sweet foods. Manage stress with relaxation techniques such as yoga or meditation. General instructions If you have problems with leakage: Use absorbable pads or wear padded underwear. Wash frequently with mild soap. Keep your genital and anal area as clean and dry as possible. Ask your health care provider if you should try a barrier cream to prevent skin irritation. Take warm baths to relieve pelvic muscle tension or spasms. Take over-the-counter and prescription medicines only as told by your health care provider. Keep all follow-up visits. How is this prevented? The cause of PFD is not always known, but there are a few things you can do to reduce the risk of developing this condition, including: Staying at a healthy weight. Getting regular exercise. Managing stress. Contact a health care provider if: Your symptoms are not improving with home care. You have signs or symptoms of PFD that get worse at home. You develop new signs or symptoms. You have signs of a urinary tract infection, such as: Fever. Chills. Increased urinary frequency. A burning feeling when urinating. You have not had a bowel movement in 3 days (constipation). Summary Pelvic floor dysfunction results when the muscles and connective tissues in your pelvic floor do not  work well. These muscles and their connections form a sling that supports your colon and bladder. In women, they also support the uterus. PFD may be caused by an injury to the pelvic area or by a weakening of pelvic muscles. PFD causes pelvic floor muscles to be too weak, too tight, or a combination of both. Symptoms may vary from person to person. In most cases, PFD can be treated with physical therapies and medicines. Surgery may be an option if other treatments do not help. This information is not intended to replace advice given to you by your health care provider. Make sure you discuss any questions you have with your health care provider. Document Revised: 06/27/2020 Document Reviewed: 06/27/2020 Elsevier Patient Education  2022 [employer].  I believe your loose stools may be due to something called overflow diarrhea. If you predominantly have constipation, straining or incomplete bowel movements at times the only thing to get through the large amounts of stool is liquid. I describe it like rocks in a tube, if you pour water into the tube, only water will come out with occ rocks(stools).  This can cause someone to feel like they are having diarrhea yet actually they have too much backup of stool. We can evaluate this with a rectal exam, and x-ray of your abdomen, or CT scan. If this is the case usually doing a combination of Benefiber 1-2 times daily with MiraLAX 17 g daily can be helpful. At  times there are medications that can also be helpful for this but generally I start with over-the-counter I also like somebody to get a squatty potty to help straighten out the colon whether having a bowel movement. Sometimes we will schedule for pelvic floor physical therapy if you are having incomplete bowel movements and failure pelvic floor is weak and contributing to the constipation.  Benefiber or Citracel is good for constipation/diarrhea/irritable bowel syndrome, it helps with weight loss and  can help lower your bad cholesterol. Please do 1 TBSP in the morning in water, coffee, or tea up to twice a day. It can take up to a month before you can see a difference with your bowel movements. It is cheapest from [employer], [employer], [employer].   Miralax is an osmotic laxative.  It only brings more water into the stool.  This is safe to take daily.  Can take up to 17 gram of miralax twice a day.  Mix with juice or coffee.  Start 1 capful at night for 3-4 days and reassess your response in 3-4 days.  You can increase and decrease the dose based on your response.  Remember, it can take up to 3-4 days to take effect OR for the effects to wear off.   I often pair this with benefiber in the morning to help assure the stool is not too loose.   I appreciate the opportunity to care for you. Quentin Mulling, PA

## 2023-05-14 NOTE — Progress Notes (Signed)
 05/14/2023 Sandra Brennan [id number] Jun 03, 1945  Referring provider: Anne Ng, NP Primary GI doctor: Dr. Chales Brennan  ASSESSMENT AND PLAN:   Diarrhea with associated abdominal pain, has incomplete Bm's with multiple Bm's at one time, can have days with out a BM Has urinary incontinence 2022 CT AB and pelvis with mild stools burden -KUB to evaluate for stool burden and rule out possibility of overflow diarrhea Can do trial of IBGARD daily, will give Add on citracel/benefiber FODMAP, trial off lactulose and lifestyle changes discussed If patient does not have overflow diarrhea, we can get stool samples and pancreatic elastase. Pending labs, will schedule for CT AB and pelvis  Type 2 diabetes mellitus with diabetic polyneuropathy, without long-term current use of insulin (HCC) Unremarkable pancrease 01/2021  Stage 3b chronic kidney disease (HCC)  Morbid obesity (HCC) Body mass index is 50.78 kg/m.  -Patient has been advised to make an attempt to improve diet and exercise patterns to aid in weight loss. -Recommended diet heavy in fruits and veggies and low in animal meats, cheeses, and dairy products, appropriate calorie intake  OSA and COPD overlap syndrome [facility])   Patient Care Team: Nche, Bonna Gains, NP as PCP - General (Internal Medicine) [name], MD (Inactive) as Consulting Physician (Orthopedic Surgery) Juanell Fairly, RN as [facility] Care Management Remigio Eisenmenger D as Social Worker  HISTORY OF PRESENT ILLNESS: 78 y.o. female with a past medical history of type 2 diabetes with hyperlipidemia, CKD stage IIIb, obesity, OSA/COPD, 07/07/2022 echocardiogram EF 55 to 60% grade 1 diastolic normal valves, constipation, personal history of colon polyps and others listed below presents for evaluation of diarrhea and abdominal pain as a new patient.   Previously saw Dr. Dulce Sellar with [name] 2023 Reviewing [facility] notes patient had esophageal  dysmotility with a barium swallow Screening Cologuard test 12/05/2021 was negative. Colonoscopy 03/2011 showed benign lymphoid polyp. No precancerous polyps. Good prep.  Cologuard 12/05/2021 that was negative recall 3 years  Discussed the use of AI scribe software for clinical note transcription with the patient, who gave verbal consent to proceed.  History of Present Illness   The patient presents with chronic diarrhea and bowel movement irregularities. She was referred by [facility] for further evaluation.  She experiences chronic diarrhea characterized by an urgent need to have bowel movements shortly after eating. The diarrhea episodes typically occur three times in succession before subsiding, but the pattern repeats the following day. The stool is watery and sometimes floating, but not oily. She also experiences nighttime stools, which sometimes result in fecal incontinence, especially after consuming crackers before bed.  She has a history of constipation and has used a vegetable tablet to aid bowel movements, though it does not alleviate the diarrhea. She has previously been on Miralax, which she found to be too effective, causing excessive bowel movements. A CT scan from 2022 noted a stool burden.  She experiences abdominal discomfort, particularly in the lower abdomen, but denies significant pain. No blood has been observed in her stool. No heartburn or trouble swallowing, but she has a history of severe heartburn that was previously managed with medication.  She reports urinary incontinence, which is managed with the use of pull-ups. She experiences dizziness upon standing and has swelling in her legs.  She has a history of falls and uses a walker, electric wheelchair, and scooter for mobility. She has started wearing pull-ups due to urinary incontinence and difficulty reaching the bathroom in time.  She  reports that she has quit smoking. Her smoking use included cigarettes. She has  never used smokeless tobacco. She reports that she does not drink alcohol and does not use drugs.  RELEVANT GI HISTORY, IMAGING AND LABS: Results   RADIOLOGY CT abdomen and pelvis: fecal impaction (2022)      CBC    Component Value Date/Time   WBC 6.7 08/10/2022 1059   RBC 4.25 08/10/2022 1059   HGB 12.0 08/10/2022 1059   HGB 11.9 07/14/2014 1014   HCT 38.6 08/10/2022 1059   HCT 37.0 07/14/2014 1014   PLT 262 08/10/2022 1059   PLT 229 07/14/2014 1014   MCV 90.8 08/10/2022 1059   MCV 90 07/14/2014 1014   MCH 28.2 08/10/2022 1059   MCHC 31.1 08/10/2022 1059   RDW 13.2 08/10/2022 1059   RDW 14.8 07/14/2014 1014   LYMPHSABS 2.4 08/25/2021 0740   LYMPHSABS 1.8 07/14/2014 1014   MONOABS 0.4 08/25/2021 0740   EOSABS 0.1 08/25/2021 0740   EOSABS 0.2 07/14/2014 1014   BASOSABS 0.0 08/25/2021 0740   BASOSABS 0.0 07/14/2014 1014   Recent Labs    07/06/22 1425 07/07/22 0144 08/10/22 1059  HGB 12.3 11.8* 12.0    CMP     Component Value Date/Time   NA 143 01/15/2023 1020   NA 142 07/14/2014 1014   K 4.9 01/15/2023 1020   CL 105 01/15/2023 1020   CO2 33 (H) 01/15/2023 1020   GLUCOSE 76 01/15/2023 1020   BUN 11 01/15/2023 1020   BUN 12 07/14/2014 1014   CREATININE 0.98 01/15/2023 1020   CREATININE 1.39 (H) 04/09/2018 1549   CALCIUM 9.1 01/15/2023 1020   PROT 6.8 01/15/2023 1020   PROT 6.9 07/14/2014 1014   ALBUMIN 3.8 01/15/2023 1020   ALBUMIN 4.3 07/14/2014 1014   AST 15 01/15/2023 1020   ALT 12 01/15/2023 1020   ALKPHOS 90 01/15/2023 1020   BILITOT 0.3 01/15/2023 1020   BILITOT 0.2 07/14/2014 1014   GFRNONAA 51 (L) 08/10/2022 1059   GFRAA 46 (L) 03/07/2019 2115      Latest Ref Rng & Units 01/15/2023   10:20 AM 07/06/2022    2:25 PM 03/05/2022    9:21 AM  Hepatic Function  Total Protein 6.0 - 8.3 g/dL 6.8  7.3    Albumin 3.5 - 5.2 g/dL 3.8  3.5  3.9   AST 0 - 37 U/L 15  17    ALT 0 - 35 U/L 12  12    Alk Phosphatase 39 - 117 U/L 90  92    Total Bilirubin 0.2  - 1.2 mg/dL 0.3  0.5    Bilirubin, Direct 0.0 - 0.2 mg/dL  0.1        Current Medications:   Current Outpatient Medications (Endocrine & Metabolic):    Semaglutide,0.25 or 0.5MG /DOS, (OZEMPIC, 0.25 OR 0.5 MG/DOSE,) 2 MG/3ML SOPN, Inject 0.5 mg into the skin every Tuesday.  Current Outpatient Medications (Cardiovascular):    amLODipine (NORVASC) 10 MG tablet, Take 1 tablet (10 mg total) by mouth at bedtime.   atorvastatin (LIPITOR) 20 MG tablet, Take 1 tablet (20 mg total) by mouth every evening.   furosemide (LASIX) 20 MG tablet, TAKE 1/2 TABLET BY MOUTH DAILY   lisinopril (ZESTRIL) 40 MG tablet, TAKE 1 TABLET BY MOUTH EVERY DAY  Current Outpatient Medications (Respiratory):    albuterol (VENTOLIN HFA) 108 (90 Base) MCG/ACT inhaler, INHALE 1 PUFF INTO THE LUNGS EVERY 6 (SIX) HOURS AS NEEDED FOR SHORTNESS  OF BREATH OR WHEEZING.   cetirizine (ZYRTEC) 10 MG tablet, Take 10 mg by mouth in the morning.   SYMBICORT 160-4.5 MCG/ACT inhaler, INHALE 2 PUFFS INTO THE LUNGS IN THE MORNING AND AT BEDTIME. RINSE MOUTH AFTER EACH USE (Patient taking differently: Inhale 1 puff into the lungs in the morning and at bedtime. Rinse mouth after each use)  Current Outpatient Medications (Analgesics):    acetaminophen (TYLENOL 8 HOUR ARTHRITIS PAIN) 650 MG CR tablet, Take 1,300 mg by mouth every 8 (eight) hours as needed (for headaches).   allopurinol (ZYLOPRIM) 100 MG tablet, Take 1 tablet (100 mg total) by mouth daily.   aspirin EC 81 MG tablet, Take 81 mg by mouth daily.   Rimegepant Sulfate (NURTEC) 75 MG TBDP, Take 1 tablet (75 mg total) by mouth as needed (take 1 at onset of headache, max is 75 in 24 hours). (Patient taking differently: Take 75 mg by mouth as needed (at the onset of a headache- max daily dose is 75 mg/24 hours).)   traMADol (ULTRAM) 50 MG tablet, Take 1 tablet (50 mg total) by mouth 3 (three) times daily.   Current Outpatient Medications (Other):    cholecalciferol (VITAMIN D3) 25 MCG  (1000 UNIT) tablet, Take 1,000 Units by mouth daily.   escitalopram (LEXAPRO) 10 MG tablet, TAKE 1 TABLET BY MOUTH EVERY DAY   Multiple Vitamin (MULTIVITAMIN) tablet, Take 1 tablet by mouth daily with breakfast.   nystatin (MYCOSTATIN/NYSTOP) powder, APPLY TOPICALLY 3 TIMES A DAY   omeprazole (PRILOSEC) 20 MG capsule, TAKE 1 CAPSULE BY MOUTH DAILY BEFORE BREAKFAST.   OPCON-A 0.027-0.315 % SOLN, Place 1 drop into both eyes 3 (three) times daily as needed (for itching).   pregabalin (LYRICA) 150 MG capsule, Take 1 capsule (150 mg total) by mouth 2 (two) times daily.  Medical History:  Past Medical History:  Diagnosis Date   Acute nontraumatic kidney injury (HCC) 05/05/2013   Acute posthemorrhagic anemia 09/06/2012   Anemia    Arthritis    "plenty" (2012-08-16)   Asthma    Chest pain 03/30/2016   Chronic lower back pain    "they say I need a whole new spinal column" (08/16/12)   COPD (chronic obstructive pulmonary disease) (HCC)    Degenerative arthritis    "all over" (2012-08-16)   Fx ankle    GERD (gastroesophageal reflux disease)    Gout 05/05/2013   Hypertension    Migraines    "used to; totally stopped when I quit drinking 30 yr ago" (08/16/12)   Myalgia and myositis    Myocardial infarction [facility]) 1992   2012-08-16 "mild MI when son died "   Obesity    Osteoporosis    "all over" (August 16, 2012)   Shortness of breath    when stressed.   Sleep apnea    "never did fix my machine; haven't used one for 5 years; I've lost 116# since then; no problems now" (2012-08-16)   Syncope 03/30/2016   Type II diabetes mellitus (HCC)    "borderline; don't test; take Metformin" (08-16-2012)   Allergies:  Allergies  Allergen Reactions   Cymbalta [Duloxetine Hcl] Anaphylaxis   Pineapple Shortness Of Breath   Sulfa Antibiotics Hives, Shortness Of Breath and Itching   Influenza Vaccines Hives and Itching   Other Swelling and Other (See Comments)    Pork barbeque- Made the face swell   Penicillins  Hives   Theophyllines Hives     Surgical History:  She  has a past surgical history  that includes Ganglion cyst excision (Right, 1980's); Knee ligament reconstruction (Right, 1980's); Total hip arthroplasty (Left, 08/03/2012); Tonsillectomy (~ 1954); Total knee arthroplasty (Left, 2001); Total knee arthroplasty (Right, 2004); Joint replacement; Abdominal hysterectomy (1990's); Total hip arthroplasty (Left, 08/03/2012); and Total hip arthroplasty (Left, 08/28/2012). Family History:  Her family history includes Arthritis in her father, maternal grandfather, maternal grandmother, mother, and sister; Colon cancer in her father; Diabetes in her maternal grandfather, maternal grandmother, and sister.  REVIEW OF SYSTEMS  : All other systems reviewed and negative except where noted in the History of Present Illness.  PHYSICAL EXAM: BP 128/74   Pulse 78   Ht 5' (1.524 m)   Wt 260 lb (117.9 kg)   BMI 50.78 kg/m  Physical Exam   GENERAL APPEARANCE: obese in no apparent distress. HEENT: No cervical lymphadenopathy, unremarkable thyroid, sclerae anicteric, conjunctiva pink. RESPIRATORY: Respiratory effort normal, breath sounds clear to auscultation bilaterally without rales, rhonchi, or wheezing. CARDIO: Regular rate and rhythm with a systolic murmur, peripheral pulses intact. ABDOMEN: Soft, non-distended, active bowel sounds in all four quadrants, no tenderness to palpation, no rebound tenderness, no mass appreciated. RECTAL: Declines. MUSCULOSKELETAL: Full range of motion, walks with walker, able to get on table with help, with non pitting edema. SKIN: Dry, intact without rashes or lesions. No jaundice. NEURO: Alert, oriented, no focal deficits. PSYCH: Cooperative, normal mood and affect.     Doree Albee, PA-C 2:33 PM

## 2023-05-15 ENCOUNTER — Ambulatory Visit: Payer: Self-pay | Admitting: Licensed Clinical Social Worker

## 2023-05-15 NOTE — Patient Instructions (Signed)
 Visit Information  Thank you for taking time to visit with me today. Please don't hesitate to contact me if I can be of assistance to you.   Following are the goals we discussed today:   Goals Addressed             This Visit's Progress    Care Coordinator Activities       Care Coordination Interventions: SW will mail out the SCAT Appliacation agian the patietn had one mailed but it was missplaced. Patient wants to read over the questions and when the SW calls back they will complete it together and the SW will fax it back the same day.   SW Will follow up on 05/28/2023 at 3:00 pm        Our next appointment is by telephone on 05/28/2023 at 3:00 pm  Please call the care guide team at [phone] if you need to cancel or reschedule your appointment.   If you are experiencing a Mental Health or Behavioral Health Crisis or need someone to talk to, please call the Suicide and Crisis Lifeline: 988 go to [facility] [address], [place] [phone]) call 911  The patient verbalized understanding of instructions, educational materials, and care plan provided today and DECLINED offer to receive copy of patient instructions, educational materials, and care plan.   Jeanie Cooks, PhD [facility], [facility] Social Worker Direct Dial: [phone]  Fax: [phone]

## 2023-05-28 ENCOUNTER — Ambulatory Visit: Payer: Self-pay | Admitting: Licensed Clinical Social Worker

## 2023-05-28 ENCOUNTER — Telehealth: Payer: Self-pay | Admitting: Nurse Practitioner

## 2023-05-28 DIAGNOSIS — E1142 Type 2 diabetes mellitus with diabetic polyneuropathy: Secondary | ICD-10-CM

## 2023-05-28 DIAGNOSIS — N1832 Chronic kidney disease, stage 3b: Secondary | ICD-10-CM

## 2023-05-28 DIAGNOSIS — E1169 Type 2 diabetes mellitus with other specified complication: Secondary | ICD-10-CM

## 2023-05-28 DIAGNOSIS — E0821 Diabetes mellitus due to underlying condition with diabetic nephropathy: Secondary | ICD-10-CM

## 2023-05-28 NOTE — Patient Instructions (Signed)
 Visit Information  Thank you for taking time to visit with me today. Please don't hesitate to contact me if I can be of assistance to you.   Following are the goals we discussed today:   Goals Addressed             This Visit's Progress    Care Coordinator Activities   On track    Care Coordination Interventions: SW will mail out the SCAT Appliacation agian the patietn had one mailed but it was missplaced. Patient wants to read over the questions and when the SW calls back they will complete it together and the SW will fax it back the same day.   SW Will follow up on 06/11/2023 at 3:00 pm        Our next appointment is by telephone on 06/11/2023 at 3:00 pm  Please call the care guide team at [phone] if you need to cancel or reschedule your appointment.   If you are experiencing a Mental Health or Behavioral Health Crisis or need someone to talk to, please call the Suicide and Crisis Lifeline: 988 go to [facility] [address] [place] [phone]) call 911  The patient verbalized understanding of instructions, educational materials, and care plan provided today and DECLINED offer to receive copy of patient instructions, educational materials, and care plan.   Jeanie Cooks, PhD [facility], [facility] Social Worker Direct Dial: [phone]  Fax: [phone]

## 2023-05-28 NOTE — Patient Outreach (Signed)
 Care Coordination   Follow Up Visit Note   05/28/2023 Name: SOLSTICE LASTINGER MRN: [id number] DOB: 01-Nov-1945  Dub Mikes Hatcher is a 78 y.o. year old female who sees Nche, Bonna Gains, NP for primary care. I spoke with  Dub Mikes Clinard by phone today.  What matters to the patients health and wellness today?  SCAT application pending     Goals Addressed             This Visit's Progress    Care Coordinator Activities   On track    Care Coordination Interventions: SW will mail out the SCAT Appliacation agian the patietn had one mailed but it was missplaced. Patient wants to read over the questions and when the SW calls back they will complete it together and the SW will fax it back the same day.   SW Will follow up on 06/11/2023 at 3:00 pm        SDOH assessments and interventions completed:  Yes     Care Coordination Interventions:  Yes, provided  Interventions Today    Flowsheet Row Most Recent Value  General Interventions   General Interventions Discussed/Reviewed General Interventions Reviewed, [employer] is awaiting to receive SCAT part A application]        Follow up plan: Follow up call scheduled for 06/11/2023 at 3:00 pm    Encounter Outcome:  Patient Visit Completed   Jeanie Cooks, PhD [facility], [facility] Social Worker Direct Dial: [phone]  Fax: [phone]

## 2023-05-28 NOTE — Telephone Encounter (Signed)
 See note

## 2023-05-28 NOTE — Assessment & Plan Note (Signed)
 Elevated UACr in 2023 and 2024: 54.35 and 50.35 respectively. Current use of furosemide, ACE-I and GLP-1RA. eGFr at 56.82ml/min, hgbA1c at 5.7% Entered ref to [facility]-clinical pharmacist to determine if safe to add jardiance or farxiga?

## 2023-05-29 ENCOUNTER — Telehealth: Payer: Self-pay

## 2023-05-31 ENCOUNTER — Encounter (HOSPITAL_COMMUNITY): Payer: Self-pay | Admitting: *Deleted

## 2023-05-31 ENCOUNTER — Other Ambulatory Visit: Payer: Self-pay

## 2023-05-31 ENCOUNTER — Ambulatory Visit (HOSPITAL_COMMUNITY)
Admission: EM | Admit: 2023-05-31 | Discharge: 2023-05-31 | Disposition: A | Discharge status: Home / Self Care | Attending: Family Medicine | Admitting: Family Medicine

## 2023-05-31 ENCOUNTER — Ambulatory Visit (INDEPENDENT_AMBULATORY_CARE_PROVIDER_SITE_OTHER)

## 2023-05-31 DIAGNOSIS — M25471 Effusion, right ankle: Secondary | ICD-10-CM | POA: Diagnosis not present

## 2023-05-31 DIAGNOSIS — S93401A Sprain of unspecified ligament of right ankle, initial encounter: Secondary | ICD-10-CM | POA: Diagnosis not present

## 2023-05-31 DIAGNOSIS — M25571 Pain in right ankle and joints of right foot: Secondary | ICD-10-CM

## 2023-05-31 DIAGNOSIS — R936 Abnormal findings on diagnostic imaging of limbs: Secondary | ICD-10-CM | POA: Diagnosis not present

## 2023-05-31 NOTE — ED Triage Notes (Signed)
 PT reports a RT ankle injury of unknown cause. This may have happened on Thursday. Pt is unsure of date of injury. Pt has swelling and pain to both ankles.

## 2023-05-31 NOTE — ED Provider Notes (Signed)
 [facility]    CSN: [id number] Arrival date & time: 05/31/23  1247      History   Chief Complaint Chief Complaint  Patient presents with   Ankle Pain    HPI Sandra Brennan is a 78 y.o. female.   Here with her adult daughter.  Patient has significant chronic edema of both lower legs.  Patient and her daughter reports that she has arthritis and brittle bones in her legs and feet.  She has a bone that is protruding medially between the ankle and the forefoot that she reports is from her previous fracture and displacement.  She reports that at the time they told her if they did surgery it would not heal and she would end up with an amputation.  She does not remember a specific injury but on Thursday, 05/28/2023, she began to have right ankle pain during that night.  She is having trouble walking on the ankle.  They have tried a boot and an ankle brace as she has both at home.  She also walks with a cane.  She went to church today and it involved a lot of stairs and she just developed worsening pain as the day went on.   Ankle Pain Associated symptoms: no back pain and no fever     Past Medical History:  Diagnosis Date   Acute nontraumatic kidney injury (HCC) 05/05/2013   Acute posthemorrhagic anemia 09/06/2012   Anemia    Arthritis    "plenty" (12-Aug-2012)   Asthma    Chest pain 03/30/2016   Chronic headaches    Chronic lower back pain    "they say I need a whole new spinal column" (Aug 12, 2012)   COPD (chronic obstructive pulmonary disease) (HCC)    Degenerative arthritis    "all over" (08/12/2012)   Fx ankle    GERD (gastroesophageal reflux disease)    Glaucoma    Gout 05/05/2013   Hypertension    Migraines    "used to; totally stopped when I quit drinking 30 yr ago" (08/12/12)   Myalgia and myositis    Myocardial infarction [facility]) 1992   08-12-2012 "mild MI when son died "   Obesity    Osteoporosis    "all over" (12-Aug-2012)   Shortness of breath    when stressed.    Sleep apnea    "never did fix my machine; haven't used one for 5 years; I've lost 116# since then; no problems now" (2012-08-12)   Syncope 03/30/2016   Type II diabetes mellitus (HCC)    "borderline; don't test; take Metformin" (08-12-12)    Patient Active Problem List   Diagnosis Date Noted   Abnormality of gait and mobility 10/15/2022   Near syncope 07/06/2022   Chronic constipation 06/04/2022   Bilateral leg edema 01/01/2022   Chronic pain syndrome 05/31/2021   Lumbar radiculitis 04/18/2021   CKD (chronic kidney disease) stage 3, GFR 30-59 ml/min (HCC) 08/20/2020   Knee pain 08/20/2020   Intertrigo 03/27/2020   Normocytic anemia 08/23/2019   Polyethylene liner wear following total hip arthroplasty requiring isolated polyethylene liner exchange (HCC) 07/12/2019   Depression, recurrent (HCC) 07/12/2018   Migraine without aura and without status migrainosus, not intractable 09/10/2017   Post-traumatic osteoarthritis of right ankle 06/09/2016   Spondylosis without myelopathy or radiculopathy, lumbar region 06/09/2016   Cervical spinal stenosis 06/09/2016   Spinal stenosis, lumbar region, without neurogenic claudication 06/09/2016   Chronic gouty arthritis 06/04/2016   Vitamin D deficiency 06/02/2016  Hyperlipidemia associated with type 2 diabetes mellitus (HCC) 06/02/2016   Closed right ankle fracture 05/01/2016   Colon polyps 05/01/2016   Fibromyalgia syndrome 05/01/2016   Pharyngeal dysphagia 06/21/2015   Morbid obesity (HCC) 05/06/2013   Osteoarthritis of left hip 08/05/2012   Type 2 diabetes mellitus with diabetic polyneuropathy, without long-term current use of insulin (HCC) 08/05/2012   Hypertension 08/05/2012   Asthma, chronic 08/05/2012   OSA (obstructive sleep apnea) 08/05/2012    Past Surgical History:  Procedure Laterality Date   ABDOMINAL HYSTERECTOMY  1990's   GANGLION CYST EXCISION Right 1980's   "wrist" (08/03/2012)   JOINT REPLACEMENT     KNEE LIGAMENT  RECONSTRUCTION Right 1980's   TONSILLECTOMY  ~ 1954   TOTAL HIP ARTHROPLASTY Left 08/03/2012   TOTAL HIP ARTHROPLASTY Left 08/03/2012   Procedure: TOTAL HIP ARTHROPLASTY;  Surgeon: [name], MD;  Location: [facility] OR;  Service: Orthopedics;  Laterality: Left;  Left Total Hip Arthroplasty   TOTAL HIP ARTHROPLASTY Left 08/28/2012   Procedure: Irrigation and Debridement hip ;  Surgeon: Kathryne Hitch, MD;  Location: [facility] OR;  Service: Orthopedics;  Laterality: Left;   TOTAL KNEE ARTHROPLASTY Left 2001   TOTAL KNEE ARTHROPLASTY Right 2004    OB History   No obstetric history on file.      Home Medications    Prior to Admission medications   Medication Sig Start Date End Date Taking? Authorizing Provider  albuterol (VENTOLIN HFA) 108 (90 Base) MCG/ACT inhaler INHALE 1 PUFF INTO THE LUNGS EVERY 6 (SIX) HOURS AS NEEDED FOR SHORTNESS OF BREATH OR WHEEZING. 09/10/22  Yes Nche, Bonna Gains, NP  allopurinol (ZYLOPRIM) 100 MG tablet Take 1 tablet (100 mg total) by mouth daily. 01/15/23  Yes Nche, Bonna Gains, NP  amLODipine (NORVASC) 10 MG tablet Take 1 tablet (10 mg total) by mouth at bedtime. 09/17/22  Yes Nche, Bonna Gains, NP  atorvastatin (LIPITOR) 20 MG tablet Take 1 tablet (20 mg total) by mouth every evening. 01/15/23  Yes Nche, Bonna Gains, NP  escitalopram (LEXAPRO) 10 MG tablet TAKE 1 TABLET BY MOUTH EVERY DAY 02/12/23  Yes Nche, Bonna Gains, NP  furosemide (LASIX) 20 MG tablet TAKE 1/2 TABLET BY MOUTH DAILY 12/08/22  Yes Nche, Bonna Gains, NP  lisinopril (ZESTRIL) 40 MG tablet TAKE 1 TABLET BY MOUTH EVERY DAY 05/06/23  Yes Nche, Bonna Gains, NP  nystatin (MYCOSTATIN/NYSTOP) powder APPLY TOPICALLY 3 TIMES A DAY 03/16/23  Yes Nche, Bonna Gains, NP  omeprazole (PRILOSEC) 20 MG capsule TAKE 1 CAPSULE BY MOUTH DAILY BEFORE BREAKFAST. 02/12/23  Yes Nche, Bonna Gains, NP  pregabalin (LYRICA) 150 MG capsule Take 1 capsule (150 mg total) by mouth 2 (two) times daily. 01/05/23   Yes Jones Bales, NP  Semaglutide,0.25 or 0.5MG /DOS, (OZEMPIC, 0.25 OR 0.5 MG/DOSE,) 2 MG/3ML SOPN Inject 0.5 mg into the skin every Tuesday. 01/20/23  Yes Nche, Bonna Gains, NP  SYMBICORT 160-4.5 MCG/ACT inhaler INHALE 2 PUFFS INTO THE LUNGS IN THE MORNING AND AT BEDTIME. RINSE MOUTH AFTER EACH USE Patient taking differently: Inhale 1 puff into the lungs in the morning and at bedtime. Rinse mouth after each use 07/03/22  Yes Nche, Bonna Gains, NP  traMADol (ULTRAM) 50 MG tablet Take 1 tablet (50 mg total) by mouth 3 (three) times daily. 01/05/23  Yes Jones Bales, NP  acetaminophen (TYLENOL 8 HOUR ARTHRITIS PAIN) 650 MG CR tablet Take 1,300 mg by mouth every 8 (eight) hours as needed (for headaches).  [provider]  aspirin EC 81 MG tablet Take 81 mg by mouth daily.    [provider]  cetirizine (ZYRTEC) 10 MG tablet Take 10 mg by mouth in the morning.    [provider]  cholecalciferol (VITAMIN D3) 25 MCG (1000 UNIT) tablet Take 1,000 Units by mouth daily.    [provider]  Multiple Vitamin (MULTIVITAMIN) tablet Take 1 tablet by mouth daily with breakfast.    [provider]  OPCON-A 0.027-0.315 % SOLN Place 1 drop into both eyes 3 (three) times daily as needed (for itching).    [provider]  Rimegepant Sulfate (NURTEC) 75 MG TBDP Take 1 tablet (75 mg total) by mouth as needed (take 1 at onset of headache, max is 75 in 24 hours). Patient taking differently: Take 75 mg by mouth as needed (at the onset of a headache- max daily dose is 75 mg/24 hours). 06/25/22   Butch Penny, NP    Family History Family History  Problem Relation Age of Onset   Arthritis Mother    Arthritis Father    Colon cancer Father    Diabetes Sister    Arthritis Sister    Diabetes Maternal Grandmother    Arthritis Maternal Grandmother    Diabetes Maternal Grandfather    Arthritis Maternal Grandfather    Migraines Neg Hx    Sleep apnea Neg Hx      Social History Social History   Tobacco Use   Smoking status: Former    Current packs/day: 1.00    Types: Cigarettes   Smokeless tobacco: Never   Tobacco comments:    08/03/2012 "quit 30 years ago"  Vaping Use   Vaping status: Never Used  Substance Use Topics   Alcohol use: No    Alcohol/week: 0.0 standard drinks of alcohol    Comment: 08/03/2012 "used to be a beeralcoholic"; stopped ~ 30 yr ago"   Drug use: No     Allergies   Cymbalta [duloxetine hcl], Pineapple, Sulfa antibiotics, Influenza vaccines, Other, Penicillins, and Theophyllines   Review of Systems Review of Systems  Constitutional:  Negative for chills and fever.  HENT:  Negative for ear pain and sore throat.   Eyes:  Negative for pain and visual disturbance.  Respiratory:  Negative for cough and shortness of breath.   Cardiovascular:  Negative for chest pain and palpitations.  Gastrointestinal:  Negative for abdominal pain, constipation, diarrhea, nausea and vomiting.  Genitourinary:  Negative for dysuria and hematuria.  Musculoskeletal:  Positive for arthralgias (Bilateral ankles but right is significantly worse than the left.) and joint swelling (Bilateral ankles but right ankle significantly worse than the left.). Negative for back pain.  Skin:  Negative for color change and rash.  Neurological:  Negative for seizures and syncope.  All other systems reviewed and are negative.    Physical Exam Triage Vital Signs ED Triage Vitals  Encounter Vitals Group     BP 05/31/23 1422 129/83     Systolic BP Percentile --      Diastolic BP Percentile --      Pulse Rate 05/31/23 1422 64     Resp 05/31/23 1422 (!) 22     Temp 05/31/23 1422 98 F (36.7 C)     Temp src --      SpO2 05/31/23 1422 92 %     Weight --      Height --      Head Circumference --      Peak Flow --  Pain Score 05/31/23 1420 10     Pain Loc --      Pain Education --      Exclude from Growth Chart --    No data found.  Updated  Vital Signs BP 129/83   Pulse 64   Temp 98 F (36.7 C)   Resp (!) 22   SpO2 92%   Visual Acuity Right Eye Distance:   Left Eye Distance:   Bilateral Distance:    Right Eye Near:   Left Eye Near:    Bilateral Near:     Physical Exam   UC Treatments / Results  Labs (all labs ordered are listed, but only abnormal results are displayed) Labs Reviewed - No data to display  EKG   Radiology DG Ankle Complete Right Result Date: 05/31/2023 CLINICAL DATA:  Right ankle pain and swelling. Possible injury on Thursday. EXAM: RIGHT ANKLE - COMPLETE 3+ VIEW COMPARISON:  Right ankle x-rays dated August 28, 2020. FINDINGS: Examination is limited due to end-stage tibiotalar osteoarthritis. There is a new pointed bony density along the anterior ankle on the lateral view with overlying soft tissue swelling, not clearly seen on the prior study. This could be a bone fragment or degenerative. Otherwise, the appearance of the right ankle is similar to the prior study. Unchanged large well corticated ossific fragment adjacent to the medial malleolus, consistent with old trauma. Diffuse soft tissue swelling, greater laterally than the prior study. IMPRESSION: 1. New pointed bony density along the anterior ankle on the lateral view with overlying soft tissue swelling, not clearly seen on the prior study. This could be a bone fragment or degenerative. Consider further evaluation with noncontrast CT. 2. End-stage tibiotalar osteoarthritis. Electronically Signed   By: Obie Dredge M.D.   On: 05/31/2023 15:21    Procedures Procedures (including critical care time)  Medications Ordered in UC Medications - No data to display  Initial Impression / Assessment and Plan / UC Course  I have reviewed the triage vital signs and the nursing notes.  Pertinent labs & imaging results that were available during my care of the patient were reviewed by me and considered in my medical decision making (see chart for  details).     Plan of Care:  Abnormal Right Ankle X-Ray: Radiology review resulted shortly after the patient left.  I was able to update the patient and her daughter about the results of her x-ray.  X-rays show a possible new fracture versus degenerative changes in the foot.  CT scan of the foot is recommended for further clarification.  Encouraged the patient and her daughter to contact her orthopedist for further workup.  She will be wearing a boot on her foot for support and stabilization.  Encouraged ice and elevation. Right ankle pain and sprain with swelling: See above for specific plans.  No pain medicine provided because she is under a pain contract, has Lyrica and tramadol and has renal disease and cannot take NSAIDs. Follow-up as needed. Final Clinical Impressions(s) / UC Diagnoses   Final diagnoses:  Acute right ankle pain  Right ankle swelling  Sprain of right ankle, unspecified ligament, initial encounter  Abnormal x-ray of lower extremity     Discharge Instructions      Patient is on Lyrica and tramadol for chronic pain associated with peripheral neuropathy and other conditions.  She has a pain contract with the pain management clinic.  She is on the tramadol due to renal function issues.  No new medication  for right ankle pain at this time due to her kidney function issues and her pain management contract.  May use tramadol or Lyrica for pain if needed.  Ankle x-ray appears to show the previous healed fracture and there are 2 other areas that could be old healed fractures or something acute.  Will update the patient once radiology has read the films.  Patient has a boot to wear at home and will start using that.  Encouraged elevation and regular ice to the joint.  Follow-up with orthopedics for further workup and management.     ED Prescriptions   None    PDMP not reviewed this encounter.   Prescilla Sours, FNP 05/31/23 [phone]

## 2023-05-31 NOTE — Progress Notes (Signed)
 Right Ankle X-Ray IMPRESSION:  1. New pointed bony density along the anterior ankle on the lateral view with overlying soft tissue swelling, not clearly seen on the prior study. This could be a bone fragment or degenerative. Consider further evaluation with noncontrast CT.   2. End-stage tibiotalar osteoarthritis.  Patient and her daughter updated.  The patient will be wearing a boot and will follow-up with orthopedics.

## 2023-05-31 NOTE — Discharge Instructions (Addendum)
 Patient is on Lyrica and tramadol for chronic pain associated with peripheral neuropathy and other conditions.  She has a pain contract with the pain management clinic.  She is on the tramadol due to renal function issues.  No new medication for right ankle pain at this time due to her kidney function issues and her pain management contract.  May use tramadol or Lyrica for pain if needed.  Ankle x-ray appears to show the previous healed fracture and there are 2 other areas that could be old healed fractures or something acute.  Will update the patient once radiology has read the films.  Patient has a boot to wear at home and will start using that.  Encouraged elevation and regular ice to the joint.  Follow-up with orthopedics for further workup and management.

## 2023-06-01 NOTE — Telephone Encounter (Signed)
 Tried calling patient and could not leave a voice message due to voice mailbox is full. Will try calling again

## 2023-06-03 ENCOUNTER — Encounter: Payer: Self-pay | Admitting: Physician Assistant

## 2023-06-03 ENCOUNTER — Ambulatory Visit (INDEPENDENT_AMBULATORY_CARE_PROVIDER_SITE_OTHER): Admitting: Physician Assistant

## 2023-06-03 DIAGNOSIS — M19171 Post-traumatic osteoarthritis, right ankle and foot: Secondary | ICD-10-CM | POA: Diagnosis not present

## 2023-06-03 NOTE — Progress Notes (Signed)
 Complex Care Management Note  Care Guide Note 06/03/2023 Name: BONNEE ZERTUCHE MRN: [id number] DOB: December 15, 1945  Dub Mikes Beth is a 78 y.o. year old female who sees Nche, Bonna Gains, NP for primary care. I reached out to Virgel Manifold by phone today to offer complex care management services.  Ms. Waterfield was given information about Complex Care Management services today including:   The Complex Care Management services include support from the care team which includes your Nurse Care Manager, Clinical Social Worker, or Pharmacist.  The Complex Care Management team is here to help remove barriers to the health concerns and goals most important to you. Complex Care Management services are voluntary, and the patient may decline or stop services at any time by request to their care team member.   Complex Care Management Consent Status: Patient agreed to services and verbal consent obtained.   Follow up plan:  Telephone appointment with complex care management team member scheduled for:  06/04/23 at 3:30 p.m.   Encounter Outcome:  Patient Scheduled  [facility]  [facility], [facility] Health Care Management Assistant Direct Dial: [phone]  Fax: [phone]
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
## 2023-06-03 NOTE — Telephone Encounter (Signed)
 Tried calling patient and could not leave a voice message. I will try calling again

## 2023-06-04 ENCOUNTER — Other Ambulatory Visit

## 2023-06-04 ENCOUNTER — Telehealth: Payer: Self-pay

## 2023-06-04 ENCOUNTER — Encounter

## 2023-06-04 NOTE — Patient Outreach (Signed)
 Care Coordination   06/04/2023 Name: Sandra Brennan MRN: [id number] DOB: [birth year]   Care Coordination Outreach Attempts:  An unsuccessful telephone outreach was attempted today to offer the patient information about available complex care management services.  Follow Up Plan:  Additional outreach attempts will be made to offer the patient complex care management information and services.   Encounter Outcome:  No Answer   Care Coordination Interventions:  No, not indicated    Lonzo Candy, BSN, [facility] [employer]  [facility], [facility]  Care Coordinator Phone: [phone]

## 2023-06-04 NOTE — Progress Notes (Unsigned)
   06/04/2023  Patient ID: Sandra Brennan, female   DOB: 1945-11-21, 78 y.o.   MRN: [id number]  Outreach attempt for scheduled telephone visit unsuccessful.  Tried to call x3, and number goes straight to voicemail.  I was able to leave HIPAA compliant voicemail with my direct phone number, and I will try to contact patient again next week if I do not hear back.  [name], PharmD, DPLA

## 2023-06-05 NOTE — Telephone Encounter (Signed)
 Called and left a voice message per DPR asking patient to give me a call back at the office at [phone]. I will try calling again

## 2023-06-08 ENCOUNTER — Other Ambulatory Visit: Payer: Self-pay | Admitting: Nurse Practitioner

## 2023-06-08 DIAGNOSIS — R600 Localized edema: Secondary | ICD-10-CM

## 2023-06-08 DIAGNOSIS — I10 Essential (primary) hypertension: Secondary | ICD-10-CM

## 2023-06-08 DIAGNOSIS — E1142 Type 2 diabetes mellitus with diabetic polyneuropathy: Secondary | ICD-10-CM

## 2023-06-08 DIAGNOSIS — L304 Erythema intertrigo: Secondary | ICD-10-CM

## 2023-06-08 NOTE — Telephone Encounter (Signed)
 Called and left a voice message per DPR on file asking patient to give me a call back at the office at [phone]. I will try calling again.

## 2023-06-09 ENCOUNTER — Telehealth: Payer: Self-pay

## 2023-06-09 NOTE — Progress Notes (Signed)
 Complex Care Management Note  Care Guide Note 06/09/2023 Name: Sandra Brennan MRN: [id number] DOB: February 14, 1946  [name] is a 78 y.o. year old female who sees Nche, Bonna Gains, NP for primary care. I reached out to Virgel Manifold by phone today to offer complex care management services.  [name] was given information about Complex Care Management services today including:   The Complex Care Management services include support from the care team which includes your Nurse Care Manager, Clinical Social Worker, or Pharmacist.  The Complex Care Management team is here to help remove barriers to the health concerns and goals most important to you. Complex Care Management services are voluntary, and the patient may decline or stop services at any time by request to their care team member.   Complex Care Management Consent Status: Patient agreed to services and verbal consent obtained.   Follow up plan:  Telephone appointment with complex care management team member scheduled for:  06/26/23 at 11:00 p.m.   Encounter Outcome:  Patient Scheduled  [facility]  [facility], [facility] Care Management Assistant Direct Dial: [phone]  Fax: [phone]

## 2023-06-09 NOTE — Progress Notes (Signed)
   06/09/2023  Patient ID: Sandra Brennan, female   DOB: 1945/05/26, 78 y.o.   MRN: [id number]  Outreach attempt to schedule telephone or in person visit to review medications and address management of chronic conditions.  I was not able to reach the patient but was able to leave HIPAA compliant voicemail with my direct phone number.  [name], PharmD, DPLA

## 2023-06-11 ENCOUNTER — Ambulatory Visit: Payer: Self-pay | Admitting: Licensed Clinical Social Worker

## 2023-06-11 NOTE — Patient Outreach (Signed)
 Complex Care Management   Visit Note  06/11/2023  Name:  Sandra Brennan MRN: [id number] DOB: 26-Sep-1945  Situation: Referral received for Complex Care Management related to SDOH Barriers:  Transportation I obtained verbal consent from patient.  Visit completed with patient  on the phone  Background:   Past Medical History:  Diagnosis Date   Acute nontraumatic kidney injury (HCC) 05/05/2013   Acute posthemorrhagic anemia 09/06/2012   Anemia    Arthritis    "plenty" (08/30/12)   Asthma    Chest pain 03/30/2016   Chronic headaches    Chronic lower back pain    "they say I need a whole new spinal column" (08/30/2012)   COPD (chronic obstructive pulmonary disease) (HCC)    Degenerative arthritis    "all over" (08/30/2012)   Fx ankle    GERD (gastroesophageal reflux disease)    Glaucoma    Gout 05/05/2013   Hypertension    Migraines    "used to; totally stopped when I quit drinking 30 yr ago" (2012-08-30)   Myalgia and myositis    Myocardial infarction [facility]) 1992   2012-08-30 "mild MI when son died "   Obesity    Osteoporosis    "all over" (08-30-2012)   Shortness of breath    when stressed.   Sleep apnea    "never did fix my machine; haven't used one for 5 years; I've lost 116# since then; no problems now" (2012-08-30)   Syncope 03/30/2016   Type II diabetes mellitus (HCC)    "borderline; don't test; take Metformin" (08/30/2012)    Assessment: Patient Reported Symptoms:  Cognitive    Neurological      HEENT      Cardiovascular      Respiratory      Endocrine      Gastrointestinal      Genitourinary      Integumentary      Musculoskeletal      Psychosocial       There were no vitals filed for this visit.  Medications Reviewed Today   Medications were not reviewed in this encounter     Recommendation:   Patient has received the SCAT Application in the mail but has not completed it, patinet is currently not home   Follow Up Plan:   Telephone follow up  appointment date/time:  07/03/2023 at 10:00 am   Jeanie Cooks, PhD [facility], [facility] Social Worker Direct Dial: [phone]  Fax: [phone]

## 2023-06-11 NOTE — Patient Instructions (Signed)
 Visit Information  Thank you for taking time to visit with me today. Please don't hesitate to contact me if I can be of assistance to you before our next scheduled appointment.  Your next care management appointment is by telephone on 07/03/2023 at 10:00 am  Telephone follow up appointment date/time:  07/03/2023 at 10:00 am  Please call the care guide team at [phone] if you need to cancel, schedule, or reschedule an appointment.   Please call the Suicide and Crisis Lifeline: 988 go to [facility] [address] [place] [phone]) call 911 if you are experiencing a Mental Health or Behavioral Health Crisis or need someone to talk to.  Jeanie Cooks, PhD [facility], [facility] Social Worker Direct Dial: [phone]  Fax: [phone]

## 2023-06-12 NOTE — Telephone Encounter (Signed)
 Medication: Furosemide (Lasix) 20 mg  Directions: Take half tablet by mouth daily  Last given: 12/08/22 Number refills: 1 Last o/v: 01/15/23 Follow up: 6 months (around 07/15/23) Labs: 01/15/23  Medication: Nystatin (Mycostatin/Nystop) powder Directions: Apply topically 3 times a day  Last given: 03/16/23 Number refills: 0 Last o/v: 01/15/23 Follow up: 6 months (around 07/15/23) Labs: 01/15/23  Medication: Semaglutide (Ozempic) 0.25 or 0.5 mg/dos Directions: Inject 0.5 mg into skin every Tuesday  Last given: 01/20/23 Number refills: 1 Last o/v: 01/15/23 Follow up: 6 months (around 07/15/23) Labs: 01/15/23  Medication: Amlodipine (Norvasc) 5 mg  Directions: Take 1 tablet by mouth at bedtime  Last given:  Number refills: 09/17/22 Last o/v: 01/15/23 Follow up: 6 months (around 07/15/23) Labs: 01/15/23

## 2023-06-23 ENCOUNTER — Ambulatory Visit (INDEPENDENT_AMBULATORY_CARE_PROVIDER_SITE_OTHER): Admitting: Family

## 2023-06-23 DIAGNOSIS — M19171 Post-traumatic osteoarthritis, right ankle and foot: Secondary | ICD-10-CM

## 2023-06-23 NOTE — Telephone Encounter (Signed)
 Letter sent to patient asking to give me a call at the office.

## 2023-06-26 ENCOUNTER — Other Ambulatory Visit: Payer: Self-pay

## 2023-06-26 DIAGNOSIS — E1169 Type 2 diabetes mellitus with other specified complication: Secondary | ICD-10-CM

## 2023-06-26 MED ORDER — EMPAGLIFLOZIN 10 MG PO TABS: 10.0000 mg | ORAL_TABLET | Freq: Every day | ORAL | 2 refills | Status: DC

## 2023-06-26 NOTE — Progress Notes (Signed)
 06/26/2023 Name: KATERIA CUTRONA MRN: [id number] DOB: 12-17-1945  Chief Complaint  Patient presents with   Diabetes Management Plan   MIREILLE LACOMBE is a 78 y.o. year old female who presented for a telephone visit.   They were referred to the pharmacist by their PCP for assistance in managing diabetes.    Subjective:  Care Team: Primary Care Provider: Kandace Organ, NP ; Next Scheduled Visit: 5/14  Medication Access/Adherence  Current Pharmacy:  [id number] [place]le, [place] - [address] AT [facility] [address] [place] [address] Phone: [phone] Fax: [phone]  -Patient reports affordability concerns with their medications: No  -Patient reports access/transportation concerns to their pharmacy: No  -Patient reports adherence concerns with their medications:  No    Diabetes: Current medications: Ozempic  0.5mg  weekly -Patient with T2DM with elevated UACr in 2023 and 2024: 54.35 and 50.35,respectively -Current use of furosemide , ACE-I and GLP-1RA. -eGFr at 56.20ml/min, hgbA1c at 5.7% -PCP would like to start SGLT2 therapy if appropriate -Patient does not endorse history of frequent or severe UTI's or vaginal yeast infections -Endorses recently increased water intake to help with constipation and "kidneys"  Objective:  Lab Results  Component Value Date   HGBA1C 5.7 01/15/2023   Lab Results  Component Value Date   CREATININE 0.98 01/15/2023   BUN 11 01/15/2023   NA 143 01/15/2023   K 4.9 01/15/2023   CL 105 01/15/2023   CO2 33 (H) 01/15/2023    Lab Results  Component Value Date   CHOL 172 01/15/2023   HDL 55.90 01/15/2023   LDLCALC 101 (H) 01/15/2023   TRIG 76.0 01/15/2023   CHOLHDL 3 01/15/2023   Medications Reviewed Today     Reviewed by Linn Rich, RPH (Pharmacist) on 06/26/23 at 1115  Med List Status: <None>   Medication Order Taking? Sig Documenting Provider Last Dose Status Informant   acetaminophen  (TYLENOL  8 HOUR ARTHRITIS PAIN) 650 MG CR tablet [id number] Yes Take 1,300 mg by mouth every 8 (eight) hours as needed (for headaches). [provider] Taking Active Self  albuterol  (VENTOLIN  HFA) 108 (90 Base) MCG/ACT inhaler [id number] Yes INHALE 1 PUFF INTO THE LUNGS EVERY 6 (SIX) HOURS AS NEEDED FOR SHORTNESS OF BREATH OR WHEEZING. Kandace Organ, NP Taking Active   allopurinol  (ZYLOPRIM ) 100 MG tablet [id number] Yes Take 1 tablet (100 mg total) by mouth daily. Nche, Connye Delaine, NP Taking Active   amLODipine  (NORVASC ) 10 MG tablet [id number] Yes Take 1 tablet (10 mg total) by mouth at bedtime. Kandace Organ, NP Taking Active   aspirin  EC 81 MG tablet [id number] Yes Take 81 mg by mouth daily. [provider] Taking Active Self  atorvastatin  (LIPITOR) 20 MG tablet [id number] Yes Take 1 tablet (20 mg total) by mouth every evening. Nche, Connye Delaine, NP Taking Active   cetirizine  (ZYRTEC ) 10 MG tablet [id number] Yes Take 10 mg by mouth in the morning. [provider] Taking Active Self  cholecalciferol  (VITAMIN D3) 25 MCG (1000 UNIT) tablet [id number] Yes Take 1,000 Units by mouth daily. [provider] Taking Active Self  escitalopram  (LEXAPRO ) 10 MG tablet [id number] Yes TAKE 1 TABLET BY MOUTH EVERY DAY Nche, Connye Delaine, NP Taking Active   furosemide  (LASIX ) 20 MG tablet [id number] Yes TAKE 1/2 TABLET BY MOUTH DAILY Nche, [name] Lum, NP Taking Active   lisinopril  (ZESTRIL ) 40 MG tablet [id number] Yes TAKE 1 TABLET BY MOUTH EVERY DAY Nche, [name] Lum, NP  Taking Active   Multiple Vitamin (MULTIVITAMIN) tablet [id number] Yes Take 1 tablet by mouth daily with breakfast. [provider] Taking Active Self  nystatin  (MYCOSTATIN /NYSTOP ) powder [id number] Yes APPLY TOPICALLY 3 TIMES A DAY Nche, Connye Delaine, NP Taking Active   omeprazole  (PRILOSEC) 20 MG capsule [id number] Yes TAKE 1 CAPSULE BY MOUTH DAILY BEFORE BREAKFAST. Kandace Organ, NP Taking Active   OPCON-A  0.027-0.315 % SOLN [id number] Yes Place 1 drop into both eyes 3 (three) times daily as needed (for itching). [provider] Taking Active Self  polycarbophil (FIBERCON) 625 MG tablet [id number] Yes Take 625 mg by mouth daily. [provider] Taking Active   Polyethylene Glycol 3350  (MIRALAX  PO) [id number] Yes Take 17 g by mouth as needed. [provider] Taking Active   pregabalin  (LYRICA ) 150 MG capsule [id number] Yes Take 1 capsule (150 mg total) by mouth 2 (two) times daily. Jodi Munroe, NP Taking Active   Rimegepant Sulfate  (NURTEC) 75 MG TBDP [id number] Yes Take 1 tablet (75 mg total) by mouth as needed (take 1 at onset of headache, max is 75 in 24 hours).  Patient taking differently: Take 75 mg by mouth as needed (at the onset of a headache- max daily dose is 75 mg/24 hours).   Clem Currier, NP Taking Active Self  Semaglutide ,0.25 or 0.5MG /DOS, (OZEMPIC , 0.25 OR 0.5 MG/DOSE,) 2 MG/3ML SOPN [id number] Yes INJECT 0.5MG  INTO THE SKIN EVERY TUESDAY Nche, Connye Delaine, NP Taking Active   SYMBICORT  160-4.5 MCG/ACT inhaler [id number] Yes INHALE 2 PUFFS INTO THE LUNGS IN THE MORNING AND AT BEDTIME. RINSE MOUTH AFTER EACH USE  Patient taking differently: Inhale 1 puff into the lungs in the morning and at bedtime. Rinse mouth after each use   Nche, Connye Delaine, NP Taking Active Self  traMADol  (ULTRAM ) 50 MG tablet [id number] Yes Take 1 tablet (50 mg total) by mouth 3 (three) times daily. Jodi Munroe, NP Taking Active            Assessment/Plan:   Diabetes: -Currently controlled -SGLT2 pharmacotherapy appropriate based on eGFR, patient's PMH, other current medications, and patient's increased water intake (will help prevent dehydration with SGLT2) -Insurance formulary reflects coverage of Jardiance or Farxiga -I recommend initiating Jardiance 10mg  daily - order pending for PCP to sign if in agreement -Patient  follows-up with PCP 5/14 and tolerance/efficacy can be addressed -Patient in agreement with plan  Linn Rich, PharmD, DPLA

## 2023-07-03 ENCOUNTER — Other Ambulatory Visit: Payer: Self-pay | Admitting: Licensed Clinical Social Worker

## 2023-07-03 NOTE — Patient Outreach (Signed)
 Complex Care Management   Visit Note  07/03/2023  Name:  Sandra Brennan MRN: [id number] DOB: 01/31/1946  Situation: Referral received for Complex Care Management related to SDOH Barriers:  Transportation I obtained verbal consent from Patient.  Visit completed with patient  on the phone  Background:   Past Medical History:  Diagnosis Date   Acute nontraumatic kidney injury (HCC) 05/05/2013   Acute posthemorrhagic anemia 09/06/2012   Anemia    Arthritis    "plenty" (2012/08/26)   Asthma    Chest pain 03/30/2016   Chronic headaches    Chronic lower back pain    "they say I need a whole new spinal column" (Aug 26, 2012)   COPD (chronic obstructive pulmonary disease) (HCC)    Degenerative arthritis    "all over" (2012/08/26)   Fx ankle    GERD (gastroesophageal reflux disease)    Glaucoma    Gout 05/05/2013   Hypertension    Migraines    "used to; totally stopped when I quit drinking 30 yr ago" (08/26/12)   Myalgia and myositis    Myocardial infarction [facility]) 1992   August 26, 2012 "mild MI when son died "   Obesity    Osteoporosis    "all over" (08-26-12)   Shortness of breath    when stressed.   Sleep apnea    "never did fix my machine; haven't used one for 5 years; I've lost 116# since then; no problems now" (2012/08/26)   Syncope 03/30/2016   Type II diabetes mellitus (HCC)    "borderline; don't test; take Metformin " (2012-08-26)    Assessment: Patient will use the transportation provided by [facility] (safe Ride) and will not apply for SCAT at this time   Recommendation:   Use safe ride for medical transportation needs  Follow Up Plan:   Closing From:  Complex Care Management  Jonda Neighbours, PhD [facility], [facility] Social Worker Direct Dial: [phone]  Fax: [phone]

## 2023-07-03 NOTE — Patient Instructions (Signed)

## 2023-07-07 NOTE — Progress Notes (Signed)
 Subjective:    Patient ID: Sandra Brennan, female    DOB: 08-Jul-1945, 78 y.o.   MRN: [id number]  HPI: Sandra Brennan is a 78 y.o. female who returns for follow up appointment for chronic pain and medication refill. She states her pain is located in her bilateral shoulders, mid- lower back radiating into her bilateral lower extremities. She also reports bilateral hip pain R>L. She rates her pain 7. Her current exercise regime is walking with her walker  and performing stretching exercises.  [name] Morphine  equivalent is 30.00 MME.   Oral Swab was Performed today.      Pain Inventory Average Pain 9 Pain Right Now 7 My pain is sharp, stabbing, and aching  In the last 24 hours, has pain interfered with the following? General activity 7 Relation with others 4 Enjoyment of life 9 What TIME of day is your pain at its worst? daytime and night Sleep (in general) NA  Pain is worse with: walking, standing, and some activites Pain improves with: medication Relief from Meds: 3  Family History  Problem Relation Age of Onset   Arthritis Mother    Arthritis Father    Colon cancer Father    Diabetes Sister    Arthritis Sister    Diabetes Maternal Grandmother    Arthritis Maternal Grandmother    Diabetes Maternal Grandfather    Arthritis Maternal Grandfather    Migraines Neg Hx    Sleep apnea Neg Hx    Social History   Socioeconomic History   Marital status: Widowed    Spouse name: Not on file   Number of children: 5   Years of education: PhD   Highest education level: Not on file  Occupational History   Occupation: Retired  Tobacco Use   Smoking status: Former    Current packs/day: 1.00    Types: Cigarettes   Smokeless tobacco: Never   Tobacco comments:    08/03/2012 "quit 30 years ago"  Vaping Use   Vaping status: Never Used  Substance and Sexual Activity   Alcohol use: No    Alcohol/week: 0.0 standard drinks of alcohol    Comment: 08/03/2012 "used to be a  beeralcoholic"; stopped ~ 30 yr ago"   Drug use: No   Sexual activity: Never  Other Topics Concern   Not on file  Social History Narrative   Lives at home with her mother and caregiver.- mother past away 01/2021 and now lives alone.   Occasional use of caffeine.   Right-handed.   Social Drivers of Corporate investment banker Strain: Low Risk  (11/28/2022)   Overall Financial Resource Strain (CARDIA)    Difficulty of Paying Living Expenses: Not hard at all  Food Insecurity: No Food Insecurity (05/15/2023)   Hunger Vital Sign    Worried About Running Out of Food in the Last Year: Never true    Ran Out of Food in the Last Year: Never true  Transportation Needs: Unmet Transportation Needs (05/15/2023)   PRAPARE - Administrator, Civil Service (Medical): Yes    Lack of Transportation (Non-Medical): Yes  Physical Activity: Inactive (11/28/2022)   Exercise Vital Sign    Days of Exercise per Week: 0 days    Minutes of Exercise per Session: 0 min  Stress: Stress Concern Present (11/28/2022)   [employer] of Occupational Health - Occupational Stress Questionnaire    Feeling of Stress : To some extent  Social Connections: Moderately Isolated (11/28/2022)  Social Advertising account executive [NHANES]    Frequency of Communication with Friends and Family: Never    Frequency of Social Gatherings with Friends and Family: More than three times a week    Attends Religious Services: More than 4 times per year    Active Member of [employer] or Organizations: No    Attends Banker Meetings: Never    Marital Status: Widowed   Past Surgical History:  Procedure Laterality Date   ABDOMINAL HYSTERECTOMY  1990's   GANGLION CYST EXCISION Right 1980's   "wrist" (08-15-2012)   JOINT REPLACEMENT     KNEE LIGAMENT RECONSTRUCTION Right 1980's   TONSILLECTOMY  ~ 1954   TOTAL HIP ARTHROPLASTY Left 08/15/2012   TOTAL HIP ARTHROPLASTY Left Aug 15, 2012   Procedure: TOTAL HIP ARTHROPLASTY;   Surgeon: Shirlee Dotter, MD;  Location: [facility] OR;  Service: Orthopedics;  Laterality: Left;  Left Total Hip Arthroplasty   TOTAL HIP ARTHROPLASTY Left 08/28/2012   Procedure: Irrigation and Debridement hip ;  Surgeon: Arnie Lao, MD;  Location: [facility];  Service: Orthopedics;  Laterality: Left;   TOTAL KNEE ARTHROPLASTY Left 2001   TOTAL KNEE ARTHROPLASTY Right 2004   Past Surgical History:  Procedure Laterality Date   ABDOMINAL HYSTERECTOMY  1990's   GANGLION CYST EXCISION Right 1980's   "wrist" (August 15, 2012)   JOINT REPLACEMENT     KNEE LIGAMENT RECONSTRUCTION Right 1980's   TONSILLECTOMY  ~ 1954   TOTAL HIP ARTHROPLASTY Left 08/15/2012   TOTAL HIP ARTHROPLASTY Left 08/15/2012   Procedure: TOTAL HIP ARTHROPLASTY;  Surgeon: Shirlee Dotter, MD;  Location: [facility] OR;  Service: Orthopedics;  Laterality: Left;  Left Total Hip Arthroplasty   TOTAL HIP ARTHROPLASTY Left 08/28/2012   Procedure: Irrigation and Debridement hip ;  Surgeon: Arnie Lao, MD;  Location: [facility] OR;  Service: Orthopedics;  Laterality: Left;   TOTAL KNEE ARTHROPLASTY Left 2001   TOTAL KNEE ARTHROPLASTY Right 2004   Past Medical History:  Diagnosis Date   Acute nontraumatic kidney injury (HCC) 05/05/2013   Acute posthemorrhagic anemia 09/06/2012   Anemia    Arthritis    "plenty" (08/15/2012)   Asthma    Chest pain 03/30/2016   Chronic headaches    Chronic lower back pain    "they say I need a whole new spinal column" (2012/08/15)   COPD (chronic obstructive pulmonary disease) (HCC)    Degenerative arthritis    "all over" (08-15-2012)   Fx ankle    GERD (gastroesophageal reflux disease)    Glaucoma    Gout 05/05/2013   Hypertension    Migraines    "used to; totally stopped when I quit drinking 30 yr ago" (08-15-12)   Myalgia and myositis    Myocardial infarction [facility]) 1992   Aug 15, 2012 "mild MI when son died "   Obesity    Osteoporosis    "all over" (08/15/2012)   Shortness of breath    when stressed.    Sleep apnea    "never did fix my machine; haven't used one for 5 years; I've lost 116# since then; no problems now" (08/15/12)   Syncope 03/30/2016   Type II diabetes mellitus (HCC)    "borderline; don't test; take Metformin " (08-15-12)   BP 137/72   Pulse 70   Ht 5' (1.524 m)   Wt 262 lb (118.8 kg)   SpO2 93%   BMI 51.17 kg/m   Opioid Risk Score:   Fall Risk Score:  `1  Depression screen PHQ  2/9     07/08/2023    8:07 AM 01/15/2023   10:11 AM 01/15/2023    9:28 AM 01/05/2023    8:19 AM 11/28/2022   10:20 AM 11/05/2022   10:26 AM 09/17/2022   10:49 AM  Depression screen PHQ 2/9  Decreased Interest 1 0 0 1 0 0 2  Down, Depressed, Hopeless 1 0 0 1 0 0 1  PHQ - 2 Score 2 0 0 2 0 0 3  Altered sleeping  1     3  Tired, decreased energy  1     3  Change in appetite  1     3  Feeling bad or failure about yourself   0     0  Trouble concentrating  0     1  Moving slowly or fidgety/restless  0     0  Suicidal thoughts  0     0  PHQ-9 Score  3     13  Difficult doing work/chores  Not difficult at all     Somewhat difficult    Review of Systems  Musculoskeletal:  Positive for back pain and gait problem.       Bilateral shoulder , right arm, right leg and both feet  Neurological:  Positive for tremors.  Psychiatric/Behavioral:  Positive for dysphoric mood.   All other systems reviewed and are negative.      Objective:   Physical Exam Vitals and nursing note reviewed.  Constitutional:      Appearance: Normal appearance. She is obese.  Neck:     Comments: Cervical Paraspinal Tenderness: C-5-C-6 Cardiovascular:     Rate and Rhythm: Normal rate and regular rhythm.     Pulses: Normal pulses.     Heart sounds: Normal heart sounds.  Pulmonary:     Effort: Pulmonary effort is normal.     Breath sounds: Normal breath sounds.  Musculoskeletal:     Comments: Normal Muscle Bulk and Muscle Testing Reveals:  Upper Extremities: Full ROM and Muscle Strength 5/5 Bilateral AC Joint  Tenderness Thoracic and Lumbar Hypersensitivity Right Greater Trochanter Tenderness Lower Extremities: Full ROM and Muscle strength 5/5 Arises from table slowly using walker or support Antalgic Gait     Skin:    General: Skin is warm and dry.  Neurological:     Mental Status: She is alert and oriented to person, place, and time.  Psychiatric:        Mood and Affect: Mood normal.        Behavior: Behavior normal.          Assessment & Plan:  1. Chronic  Bilateral Shoulder Pain: Continue HEP as Tolerated.  Continue to Monitor. 07/08/2023 2. Lumbar Spinal Stenosis: Continue HEP as Tolerated . Continue to Monitor. 07/08/2023 3. Fibromyalgia: Refilled: Pregabalin  150 mg Twice a day #60. Continue HEP as Tolerated. Continue to Monitor. 07/08/2023 4. Bilateral Greater Trochanter Bursitis: Continue to alternate Ice and Heat Therapy. Continue to Monitor. 07/08/2023. 5. Bilateral Lower Extremities with Neuropathic Pain: Continue Pregablin. Continue to Monitor. 07/08/2023 6.. Chronic Pain Syndrome: Continue :: Tramadol  50 mg one tablet TID as needed for pain. #90  We will continue the opioid monitoring program, this consists of regular clinic visits, examinations, urine drug screen, pill counts as well as use of [place]  Controlled Substance Reporting system. A 12 month History has been reviewed on the   Controlled Substance Reporting System on 07/08/2023.  Continue to monitor.  7. Chronic Bilateral Thoracic Pain :  Continue HEP as Tolerated. Continue to Monitor. 07/08/2023 8. Cervicalgia: No Complaints today. Continue HEP as Tolerated. Continue to Monitor. 07/08/2023   F/U in  2 months       2

## 2023-07-08 ENCOUNTER — Encounter: Payer: 59 | Attending: Registered Nurse | Admitting: Registered Nurse

## 2023-07-08 ENCOUNTER — Encounter: Payer: Self-pay | Admitting: Registered Nurse

## 2023-07-08 VITALS — BP 137/72 | HR 70 | Ht 60.0 in | Wt 262.0 lb

## 2023-07-08 DIAGNOSIS — M546 Pain in thoracic spine: Secondary | ICD-10-CM | POA: Diagnosis present

## 2023-07-08 DIAGNOSIS — M797 Fibromyalgia: Secondary | ICD-10-CM | POA: Diagnosis present

## 2023-07-08 DIAGNOSIS — G894 Chronic pain syndrome: Secondary | ICD-10-CM | POA: Diagnosis present

## 2023-07-08 DIAGNOSIS — M7062 Trochanteric bursitis, left hip: Secondary | ICD-10-CM | POA: Diagnosis present

## 2023-07-08 DIAGNOSIS — Z79899 Other long term (current) drug therapy: Secondary | ICD-10-CM | POA: Insufficient documentation

## 2023-07-08 DIAGNOSIS — M25512 Pain in left shoulder: Secondary | ICD-10-CM | POA: Insufficient documentation

## 2023-07-08 DIAGNOSIS — M5416 Radiculopathy, lumbar region: Secondary | ICD-10-CM | POA: Insufficient documentation

## 2023-07-08 DIAGNOSIS — M7061 Trochanteric bursitis, right hip: Secondary | ICD-10-CM | POA: Diagnosis present

## 2023-07-08 DIAGNOSIS — Z5181 Encounter for therapeutic drug level monitoring: Secondary | ICD-10-CM | POA: Insufficient documentation

## 2023-07-08 DIAGNOSIS — G8929 Other chronic pain: Secondary | ICD-10-CM | POA: Insufficient documentation

## 2023-07-08 DIAGNOSIS — M25511 Pain in right shoulder: Secondary | ICD-10-CM | POA: Diagnosis present

## 2023-07-08 MED ORDER — TRAMADOL HCL 50 MG PO TABS: 50.0000 mg | ORAL_TABLET | Freq: Three times a day (TID) | ORAL | 5 refills | Status: DC

## 2023-07-12 LAB — DRUG TOX MONITOR 1 W/CONF, ORAL FLD

## 2023-07-12 LAB — DRUG TOX ALC METAB W/CON, ORAL FLD: Alcohol Metabolite: NEGATIVE ng/mL (ref <25)

## 2023-07-14 NOTE — Progress Notes (Signed)
 Office Visit Note   Patient: Sandra Brennan           Date of Birth: [birth year]           MRN: [id number] Visit Date: 06/23/2023              Requested by: Kandace Organ, NP [address] [place],  [place] [address] PCP: Kandace Organ, NP  Chief Complaint  Patient presents with   Right Ankle - Follow-up      HPI: The patient is a 78 year old woman who is seen today in referral from [place].  The patient is well-known to Dr. Aviva Lemmings she presents today complaining of right foot and ankle pain.  She reports her primary complaint is pain to the plantar aspect of her foot.  She feels like her pain has been worse wearing the short cam walker.  Has significant posttraumatic osteoarthritis following a fracture with displacement.  At the time of injury this was not surgically fixed.  She is also at risk of below knee amputation on right due to comorbidities  Of note she has significant chronic edema to bilateral lower extremities.  She also has a history of osteoarthritis.  History of peripheral neuropathy.  Assessment & Plan: Visit Diagnoses: No diagnosis found.  Plan: Advanced tibiotalar and subtalar arthritis.  Radiographs discussed with Dr. Julio Ohm, recommended continued conservative measures and offered brace wear.  Follow-Up Instructions: No follow-ups on file.   Right Ankle Exam   Tenderness  The patient is experiencing no tenderness.       Patient is alert, oriented, no adenopathy, well-dressed, normal affect, normal respiratory effort. On examination right lower extremity there is mild chronic edema present without weeping there is no erythema she has a palpable dorsalis pedis pulse.  She is tender over the area of her prominent osteophyte.  There is reduced tibiotalar and subtalar range of motion  Imaging: No results found. No images are attached to the encounter.  Labs: Lab Results  Component Value Date   HGBA1C 5.7 01/15/2023   HGBA1C 5.6  06/04/2022   HGBA1C 5.8 01/29/2022   ESRSEDRATE 17 01/28/2022   ESRSEDRATE 52 (H) 07/23/2020   ESRSEDRATE 66 (H) 08/23/2019   CRP 14 (H) 01/28/2022   CRP 16 (H) 07/23/2020   CRP 1.7 08/23/2019   LABURIC 5.6 01/15/2023   LABURIC 7.3 (H) 03/26/2020   LABURIC 7.0 08/23/2019   REPTSTATUS 01/07/2021 FINAL 01/05/2021   GRAMSTAIN  08/28/2012    FEW WBC PRESENT,BOTH PMN AND MONONUCLEAR NO SQUAMOUS EPITHELIAL CELLS SEEN RARE GRAM POSITIVE RODS   CULT >=100,000 COLONIES/mL ESCHERICHIA COLI (A) 01/05/2021   LABORGA ESCHERICHIA COLI (A) 01/05/2021     Lab Results  Component Value Date   ALBUMIN  3.8 01/15/2023   ALBUMIN  3.5 07/06/2022   ALBUMIN  3.9 03/05/2022    Lab Results  Component Value Date   MG 2.1 07/09/2022   MG 1.7 07/07/2022   MG 1.7 08/23/2020   Lab Results  Component Value Date   VD25OH 83.12 04/05/2021    No results found for: "PREALBUMIN"    Latest Ref Rng & Units 08/10/2022   10:59 AM 07/07/2022    1:44 AM 07/06/2022    2:25 PM  CBC EXTENDED  WBC 4.0 - 10.5 K/uL 6.7  7.0  6.6   RBC 3.87 - 5.11 MIL/uL 4.25  4.16  4.36   Hemoglobin 12.0 - 15.0 g/dL 19.1  47.8  29.5   HCT 36.0 - 46.0 % 38.6  36.1  40.1   Platelets 150 - 400 K/uL 262  204  220      There is no height or weight on file to calculate BMI.  Orders:  No orders of the defined types were placed in this encounter.  No orders of the defined types were placed in this encounter.    Procedures: No procedures performed  Clinical Data: No additional findings.  ROS:  All other systems negative, except as noted in the HPI. Review of Systems  Objective: Vital Signs: There were no vitals taken for this visit.  Specialty Comments:  No specialty comments available.  PMFS History: Patient Active Problem List   Diagnosis Date Noted   Abnormality of gait and mobility 10/15/2022   Near syncope 07/06/2022   Chronic constipation 06/04/2022   Bilateral leg edema 01/01/2022   Chronic pain syndrome  05/31/2021   Lumbar radiculitis 04/18/2021   CKD (chronic kidney disease) stage 3, GFR 30-59 ml/min (HCC) 08/20/2020   Knee pain 08/20/2020   Intertrigo 03/27/2020   Normocytic anemia 08/23/2019   Polyethylene liner wear following total hip arthroplasty requiring isolated polyethylene liner exchange (HCC) 07/12/2019   Depression, recurrent (HCC) 07/12/2018   Migraine without aura and without status migrainosus, not intractable 09/10/2017   Post-traumatic osteoarthritis of right ankle 06/09/2016   Spondylosis without myelopathy or radiculopathy, lumbar region 06/09/2016   Cervical spinal stenosis 06/09/2016   Spinal stenosis, lumbar region, without neurogenic claudication 06/09/2016   Chronic gouty arthritis 06/04/2016   Vitamin D  deficiency 06/02/2016   Hyperlipidemia associated with type 2 diabetes mellitus (HCC) 06/02/2016   Closed right ankle fracture 05/01/2016   Colon polyps 05/01/2016   Fibromyalgia syndrome 05/01/2016   Pharyngeal dysphagia 06/21/2015   Morbid obesity (HCC) 05/06/2013   Osteoarthritis of left hip 08/05/2012   Type 2 diabetes mellitus with diabetic polyneuropathy, without long-term current use of insulin  (HCC) 08/05/2012   Hypertension 08/05/2012   Asthma, chronic 08/05/2012   OSA (obstructive sleep apnea) 08/05/2012   Past Medical History:  Diagnosis Date   Acute nontraumatic kidney injury (HCC) 05/05/2013   Acute posthemorrhagic anemia 09/06/2012   Anemia    Arthritis    "plenty" (08-25-2012)   Asthma    Chest pain 03/30/2016   Chronic headaches    Chronic lower back pain    "they say I need a whole new spinal column" (08/25/2012)   COPD (chronic obstructive pulmonary disease) (HCC)    Degenerative arthritis    "all over" (08-25-12)   Fx ankle    GERD (gastroesophageal reflux disease)    Glaucoma    Gout 05/05/2013   Hypertension    Migraines    "used to; totally stopped when I quit drinking 30 yr ago" (08/25/12)   Myalgia and myositis     Myocardial infarction [facility]) 1992   08/25/2012 "mild MI when son died "   Obesity    Osteoporosis    "all over" (08-25-2012)   Shortness of breath    when stressed.   Sleep apnea    "never did fix my machine; haven't used one for 5 years; I've lost 116# since then; no problems now" (Aug 25, 2012)   Syncope 03/30/2016   Type II diabetes mellitus (HCC)    "borderline; don't test; take Metformin " (08-25-12)    Family History  Problem Relation Age of Onset   Arthritis Mother    Arthritis Father    Colon cancer Father    Diabetes Sister    Arthritis Sister    Diabetes  Maternal Grandmother    Arthritis Maternal Grandmother    Diabetes Maternal Grandfather    Arthritis Maternal Grandfather    Migraines Neg Hx    Sleep apnea Neg Hx     Past Surgical History:  Procedure Laterality Date   ABDOMINAL HYSTERECTOMY  1990's   GANGLION CYST EXCISION Right 1980's   "wrist" (08/03/2012)   JOINT REPLACEMENT     KNEE LIGAMENT RECONSTRUCTION Right 1980's   TONSILLECTOMY  ~ 1954   TOTAL HIP ARTHROPLASTY Left 08/03/2012   TOTAL HIP ARTHROPLASTY Left 08/03/2012   Procedure: TOTAL HIP ARTHROPLASTY;  Surgeon: Shirlee Dotter, MD;  Location: [facility] OR;  Service: Orthopedics;  Laterality: Left;  Left Total Hip Arthroplasty   TOTAL HIP ARTHROPLASTY Left 08/28/2012   Procedure: Irrigation and Debridement hip ;  Surgeon: Arnie Lao, MD;  Location: [facility] OR;  Service: Orthopedics;  Laterality: Left;   TOTAL KNEE ARTHROPLASTY Left 2001   TOTAL KNEE ARTHROPLASTY Right 2004   Social History   Occupational History   Occupation: Retired  Tobacco Use   Smoking status: Former    Current packs/day: 1.00    Types: Cigarettes   Smokeless tobacco: Never   Tobacco comments:    08/03/2012 "quit 30 years ago"  Vaping Use   Vaping status: Never Used  Substance and Sexual Activity   Alcohol use: No    Alcohol/week: 0.0 standard drinks of alcohol    Comment: 08/03/2012 "used to be a beeralcoholic"; stopped ~ 30 yr ago"    Drug use: No   Sexual activity: Never

## 2023-07-15 ENCOUNTER — Ambulatory Visit: Admitting: Gastroenterology

## 2023-07-15 ENCOUNTER — Encounter: Payer: Self-pay | Admitting: Nurse Practitioner

## 2023-07-15 ENCOUNTER — Ambulatory Visit: Payer: 59 | Admitting: Nurse Practitioner

## 2023-07-15 VITALS — BP 138/74 | HR 75 | Temp 97.3°F | Ht 59.5 in | Wt 263.2 lb

## 2023-07-15 DIAGNOSIS — N1831 Chronic kidney disease, stage 3a: Secondary | ICD-10-CM

## 2023-07-15 DIAGNOSIS — Z7985 Long-term (current) use of injectable non-insulin antidiabetic drugs: Secondary | ICD-10-CM

## 2023-07-15 DIAGNOSIS — K5909 Other constipation: Secondary | ICD-10-CM

## 2023-07-15 DIAGNOSIS — E1169 Type 2 diabetes mellitus with other specified complication: Secondary | ICD-10-CM

## 2023-07-15 DIAGNOSIS — M48061 Spinal stenosis, lumbar region without neurogenic claudication: Secondary | ICD-10-CM | POA: Diagnosis not present

## 2023-07-15 DIAGNOSIS — E785 Hyperlipidemia, unspecified: Secondary | ICD-10-CM

## 2023-07-15 DIAGNOSIS — M4802 Spinal stenosis, cervical region: Secondary | ICD-10-CM

## 2023-07-15 DIAGNOSIS — R531 Weakness: Secondary | ICD-10-CM

## 2023-07-15 DIAGNOSIS — R269 Unspecified abnormalities of gait and mobility: Secondary | ICD-10-CM

## 2023-07-15 DIAGNOSIS — E1142 Type 2 diabetes mellitus with diabetic polyneuropathy: Secondary | ICD-10-CM

## 2023-07-15 DIAGNOSIS — Z780 Asymptomatic menopausal state: Secondary | ICD-10-CM

## 2023-07-15 DIAGNOSIS — Z7984 Long term (current) use of oral hypoglycemic drugs: Secondary | ICD-10-CM

## 2023-07-15 DIAGNOSIS — N1832 Chronic kidney disease, stage 3b: Secondary | ICD-10-CM

## 2023-07-15 DIAGNOSIS — M19171 Post-traumatic osteoarthritis, right ankle and foot: Secondary | ICD-10-CM

## 2023-07-15 DIAGNOSIS — I10 Essential (primary) hypertension: Secondary | ICD-10-CM | POA: Diagnosis not present

## 2023-07-15 LAB — RENAL FUNCTION PANEL
Albumin: 4.2 g/dL (ref 3.5–5.2)
BUN: 13 mg/dL (ref 6–23)
CO2: 30 meq/L (ref 19–32)
Calcium: 9.1 mg/dL (ref 8.4–10.5)
Chloride: 103 meq/L (ref 96–112)
Creatinine, Ser: 1.16 mg/dL (ref 0.40–1.20)
GFR: 45.93 mL/min — ABNORMAL LOW (ref >60.00)
Glucose, Bld: 79 mg/dL (ref 70–99)
Phosphorus: 4.1 mg/dL (ref 2.3–4.6)
Potassium: 4 meq/L (ref 3.5–5.1)
Sodium: 141 meq/L (ref 135–145)

## 2023-07-15 LAB — MICROALBUMIN / CREATININE URINE RATIO
Creatinine,U: 69.4 mg/dL
Microalb Creat Ratio: 69.3 mg/g — ABNORMAL HIGH (ref 0.0–30.0)
Microalb, Ur: 4.8 mg/dL — ABNORMAL HIGH (ref 0.0–1.9)

## 2023-07-15 LAB — LIPID PANEL
Cholesterol: 154 mg/dL (ref 0–200)
HDL: 69.4 mg/dL (ref >39.00)
LDL Cholesterol: 70 mg/dL (ref 0–99)
NonHDL: 84.37
Total CHOL/HDL Ratio: 2
Triglycerides: 71 mg/dL (ref 0.0–149.0)
VLDL: 14.2 mg/dL (ref 0.0–40.0)

## 2023-07-15 LAB — HEMOGLOBIN A1C: Hgb A1c MFr Bld: 5.5 % (ref 4.6–6.5)

## 2023-07-15 NOTE — Patient Instructions (Addendum)
 Start IBGuard OVER THE COUNTER Maintain low FODMAP diet  Go to lab  Low-FODMAP Eating Plan  FODMAP stands for fermentable oligosaccharides, disaccharides, monosaccharides, and polyols. These are sugars that are hard for some people to digest. A low-FODMAP eating plan may help some people who have irritable bowel syndrome (IBS) and certain other bowel (intestinal) diseases to manage their symptoms. This meal plan can be complicated to follow. Work with a diet and nutrition specialist (dietitian) to make a low-FODMAP eating plan that is right for you. A dietitian can help make sure that you get enough nutrition from this diet. What are tips for following this plan? Reading food labels Check labels for hidden FODMAPs such as: High-fructose syrup. Honey. Agave. Natural fruit flavors. Onion or garlic powder. Choose low-FODMAP foods that contain 3-4 grams of fiber per serving. Check food labels for serving sizes. Eat only one serving at a time to make sure FODMAP levels stay low. Shopping Shop with a list of foods that are recommended on this diet and make a meal plan. Meal planning Follow a low-FODMAP eating plan for up to 6 weeks, or as told by your health care provider or dietitian. To follow the eating plan: Eliminate high-FODMAP foods from your diet completely. Choose only low-FODMAP foods to eat. You will do this for 2-6 weeks. Gradually reintroduce high-FODMAP foods into your diet one at a time. Most people should wait a few days before introducing the next new high-FODMAP food into their meal plan. Your dietitian can recommend how quickly you may reintroduce foods. Keep a daily record of what and how much you eat and drink. Make note of any symptoms that you have after eating. Review your daily record with a dietitian regularly to identify which foods you can eat and which foods you should avoid. General tips Drink enough fluid each day to keep your urine pale yellow. Avoid processed  foods. These often have added sugar and may be high in FODMAPs. Avoid most dairy products, whole grains, and sweeteners. Work with a dietitian to make sure you get enough fiber in your diet. Avoid high FODMAP foods at meals to manage symptoms. Recommended foods Fruits Bananas, oranges, tangerines, lemons, limes, blueberries, raspberries, strawberries, grapes, cantaloupe, honeydew melon, kiwi, papaya, passion fruit, and pineapple. Limited amounts of dried cranberries, banana chips, and shredded coconut. Vegetables Eggplant, zucchini, cucumber, peppers, green beans, bean sprouts, lettuce, arugula, kale, Swiss chard, spinach, collard greens, bok choy, summer squash, potato, and tomato. Limited amounts of corn, carrot, and sweet potato. Green parts of scallions. Grains Gluten-free grains, such as rice, oats, buckwheat, quinoa, corn, polenta, and millet. Gluten-free pasta, bread, or cereal. Rice noodles. Corn tortillas. Meats and other proteins Unseasoned beef, pork, poultry, or fish. Eggs. Helene Loader. Tofu (firm) and tempeh. Limited amounts of nuts and seeds, such as almonds, walnuts, [place] nuts, pecans, peanuts, nut butters, pumpkin seeds, chia seeds, and sunflower seeds. Dairy Lactose-free milk, yogurt, and kefir. Lactose-free cottage cheese and ice cream. Non-dairy milks, such as almond, coconut, hemp, and rice milk. Non-dairy yogurt. Limited amounts of goat cheese, brie, mozzarella, parmesan, swiss, and other hard cheeses. Fats and oils Butter-free spreads. Vegetable oils, such as olive, canola, and sunflower oil. Seasoning and other foods Artificial sweeteners with names that do not end in "ol," such as aspartame, saccharine, and stevia. Maple syrup, white table sugar, raw sugar, brown sugar, and molasses. Mayonnaise, soy sauce, and tamari. Fresh basil, coriander, parsley, rosemary, and thyme. Beverages Water and mineral water. Sugar-sweetened soft drinks. Small  amounts of orange juice or cranberry  juice. Black and green tea. Most dry wines. Coffee. The items listed above may not be a complete list of foods and beverages you can eat. Contact a dietitian for more information. Foods to avoid Fruits Fresh, dried, and juiced forms of apple, pear, watermelon, peach, plum, cherries, apricots, blackberries, boysenberries, figs, nectarines, and mango. Avocado. Vegetables Chicory root, artichoke, asparagus, cabbage, snow peas, Brussels sprouts, broccoli, sugar snap peas, mushrooms, celery, and cauliflower. Onions, garlic, leeks, and the white part of scallions. Grains Wheat, including kamut, durum, and semolina. Barley and bulgur. Couscous. Wheat-based cereals. Wheat noodles, bread, crackers, and pastries. Meats and other proteins Fried or fatty meat. Sausage. Cashews and pistachios. Soybeans, baked beans, black beans, chickpeas, kidney beans, fava beans, navy beans, lentils, black-eyed peas, and split peas. Dairy Milk, yogurt, ice cream, and soft cheese. Cream and sour cream. Milk-based sauces. Custard. Buttermilk. Soy milk. Seasoning and other foods Any sugar-free gum or candy. Foods that contain artificial sweeteners such as sorbitol, mannitol, isomalt, or xylitol. Foods that contain honey, high-fructose corn syrup, or agave. Bouillon, vegetable stock, beef stock, and chicken stock. Garlic and onion powder. Condiments made with onion, such as hummus, chutney, pickles, relish, salad dressing, and salsa. Tomato paste. Beverages Chicory-based drinks. Coffee substitutes. Chamomile tea. Fennel tea. Sweet or fortified wines such as port or sherry. Diet soft drinks made with isomalt, mannitol, maltitol, sorbitol, or xylitol. Apple, pear, and mango juice. Juices with high-fructose corn syrup. The items listed above may not be a complete list of foods and beverages you should avoid. Contact a dietitian for more information. Summary FODMAP stands for fermentable oligosaccharides, disaccharides,  monosaccharides, and polyols. These are sugars that are hard for some people to digest. A low-FODMAP eating plan is a short-term diet that helps to ease symptoms of certain bowel diseases. The eating plan usually lasts up to 6 weeks. After that, high-FODMAP foods are reintroduced gradually and one at a time. This can help you find out which foods may be causing symptoms. A low-FODMAP eating plan can be complicated. It is best to work with a dietitian who has experience with this type of plan. This information is not intended to replace advice given to you by your health care provider. Make sure you discuss any questions you have with your health care provider. Document Revised: 07/07/2019 Document Reviewed: 07/07/2019 Elsevier Patient Education  2024 [employer].

## 2023-07-15 NOTE — Progress Notes (Unsigned)
 Established Patient Visit  Patient: Sandra Brennan   DOB: 05/24/1945   78 y.o. Female  MRN: [id number] Visit Date: 07/17/2023  Subjective:     Chief Complaint  Patient presents with   Follow-up   Abdominal Pain    Stomach hurst when having a bm   cant reach over her head without pain   Foot Injury    Fractured right foot her foot wore boot for two weeks but was told to take it off because it will make it worse   HPI Hyperlipidemia associated with type 2 diabetes mellitus (HCC) Repeat lipid panel: LDL at goal Maintain atorvastatin  dose  Type 2 diabetes mellitus with diabetic polyneuropathy, without long-term current use of insulin  (HCC) Positive DIABETES nephropathy and neuropathy LDL at goal BP at goal Advised to schedule DM eye exam Current use of furosemide  10mg  daily, Lisinopril  40mg , jardiance  10mg , ozempic  0.5mg  weekly  Repeat hgbA1c, UACr, and CMP: DIABETES controlled with hgbA1c at 5.5% Increased UACr and decline in renal function: 69.3mg /g and eGFr 45.33ml/min Maintain med doses Entered referral to nephrology F/up in 3months  Spinal stenosis, lumbar region, without neurogenic claudication Reports progressive weakness which makes completion of ADLs difficulty. She has difficulty with bathing an dressing due to limited ROM secondary to OA of shoulders and hands. She is requesting got personal aid to assist with ADLs Order entered  Chronic constipation Chronic constipation and ABDOMEN pain. Under the care of GI  Wt Readings from Last 3 Encounters:  07/15/23 263 lb 3.2 oz (119.4 kg)  07/08/23 262 lb (118.8 kg)  05/14/23 260 lb (117.9 kg)   ref  Reviewed medical, surgical, and social history today  Medications: Outpatient Medications Prior to Visit  Medication Sig   acetaminophen  (TYLENOL  8 HOUR ARTHRITIS PAIN) 650 MG CR tablet Take 1,300 mg by mouth every 8 (eight) hours as needed (for headaches).   albuterol  (VENTOLIN  HFA) 108 (90 Base)  MCG/ACT inhaler INHALE 1 PUFF INTO THE LUNGS EVERY 6 (SIX) HOURS AS NEEDED FOR SHORTNESS OF BREATH OR WHEEZING.   allopurinol  (ZYLOPRIM ) 100 MG tablet Take 1 tablet (100 mg total) by mouth daily.   amLODipine  (NORVASC ) 10 MG tablet Take 1 tablet (10 mg total) by mouth at bedtime.   aspirin  EC 81 MG tablet Take 81 mg by mouth daily.   atorvastatin  (LIPITOR) 20 MG tablet Take 1 tablet (20 mg total) by mouth every evening.   cetirizine  (ZYRTEC ) 10 MG tablet Take 10 mg by mouth in the morning.   cholecalciferol  (VITAMIN D3) 25 MCG (1000 UNIT) tablet Take 1,000 Units by mouth daily.   escitalopram  (LEXAPRO ) 10 MG tablet TAKE 1 TABLET BY MOUTH EVERY DAY   furosemide  (LASIX ) 20 MG tablet TAKE 1/2 TABLET BY MOUTH DAILY   lisinopril  (ZESTRIL ) 40 MG tablet TAKE 1 TABLET BY MOUTH EVERY DAY   Multiple Vitamin (MULTIVITAMIN) tablet Take 1 tablet by mouth daily with breakfast.   nystatin  (MYCOSTATIN /NYSTOP ) powder APPLY TOPICALLY 3 TIMES A DAY   omeprazole  (PRILOSEC) 20 MG capsule TAKE 1 CAPSULE BY MOUTH DAILY BEFORE BREAKFAST.   OPCON-A  0.027-0.315 % SOLN Place 1 drop into both eyes 3 (three) times daily as needed (for itching).   polycarbophil (FIBERCON) 625 MG tablet Take 625 mg by mouth daily.   Polyethylene Glycol 3350  (MIRALAX  PO) Take 17 g by mouth as needed.   pregabalin  (LYRICA ) 150 MG capsule Take 1 capsule (150 mg total) by  mouth 2 (two) times daily.   Rimegepant Sulfate  (NURTEC) 75 MG TBDP Take 1 tablet (75 mg total) by mouth as needed (take 1 at onset of headache, max is 75 in 24 hours). (Patient taking differently: Take 75 mg by mouth as needed (at the onset of a headache- max daily dose is 75 mg/24 hours).)   Semaglutide ,0.25 or 0.5MG /DOS, (OZEMPIC , 0.25 OR 0.5 MG/DOSE,) 2 MG/3ML SOPN INJECT 0.5MG  INTO THE SKIN EVERY TUESDAY   SYMBICORT  160-4.5 MCG/ACT inhaler INHALE 2 PUFFS INTO THE LUNGS IN THE MORNING AND AT BEDTIME. RINSE MOUTH AFTER EACH USE (Patient taking differently: Inhale 1 puff into  the lungs in the morning and at bedtime. Rinse mouth after each use)   traMADol  (ULTRAM ) 50 MG tablet Take 1 tablet (50 mg total) by mouth 3 (three) times daily.   [DISCONTINUED] empagliflozin  (JARDIANCE ) 10 MG TABS tablet Take 1 tablet (10 mg total) by mouth daily before breakfast.   No facility-administered medications prior to visit.   Reviewed past medical and social history.   ROS per HPI above      Objective:  BP 138/74 (BP Location: Left Arm, Patient Position: Sitting, Cuff Size: Normal)   Pulse 75   Temp (!) 97.3 F (36.3 C) (Temporal)   Ht 4' 11.5" (1.511 m)   Wt 263 lb 3.2 oz (119.4 kg)   BMI 52.27 kg/m      Physical Exam Vitals and nursing note reviewed.  Cardiovascular:     Rate and Rhythm: Normal rate and regular rhythm.     Pulses: Normal pulses.     Heart sounds: Normal heart sounds.  Pulmonary:     Effort: Pulmonary effort is normal.     Breath sounds: Normal breath sounds.  Abdominal:     General: Bowel sounds are normal. There is no distension.     Palpations: Abdomen is soft.     Tenderness: There is no abdominal tenderness.  Musculoskeletal:     Right lower leg: Edema present.     Left lower leg: Edema present.  Neurological:     Mental Status: She is alert and oriented to person, place, and time.     Motor: Weakness present.     Gait: Gait abnormal.     Comments: Ambulates with walker     Results for orders placed or performed in visit on 07/15/23  Microalbumin / creatinine urine ratio  Result Value Ref Range   Microalb, Ur 4.8 (H) 0.0 - 1.9 mg/dL   Creatinine,U 78.2 mg/dL   Microalb Creat Ratio 69.3 (H) 0.0 - 30.0 mg/g  Hemoglobin A1c  Result Value Ref Range   Hgb A1c MFr Bld 5.5 4.6 - 6.5 %  Lipid panel  Result Value Ref Range   Cholesterol 154 0 - 200 mg/dL   Triglycerides 95.6 0.0 - 149.0 mg/dL   HDL 21.30 >86.57 mg/dL   VLDL 84.6 0.0 - 96.2 mg/dL   LDL Cholesterol 70 0 - 99 mg/dL   Total CHOL/HDL Ratio 2    NonHDL 84.37    Renal Function Panel  Result Value Ref Range   Sodium 141 135 - 145 mEq/L   Potassium 4.0 3.5 - 5.1 mEq/L   Chloride 103 96 - 112 mEq/L   CO2 30 19 - 32 mEq/L   Albumin  4.2 3.5 - 5.2 g/dL   BUN 13 6 - 23 mg/dL   Creatinine, Ser 9.52 0.40 - 1.20 mg/dL   Glucose, Bld 79 70 - 99 mg/dL   Phosphorus 4.1  2.3 - 4.6 mg/dL   GFR 40.98 (L) >11.91 mL/min   Calcium  9.1 8.4 - 10.5 mg/dL      Assessment & Plan:    Problem List Items Addressed This Visit     Abnormality of gait and mobility   Relevant Orders   Ambulatory referral to Home Health   Cervical spinal stenosis   Relevant Orders   Ambulatory referral to Home Health   Chronic constipation   Chronic constipation and ABDOMEN pain. Under the care of GI      CKD (chronic kidney disease) stage 3, GFR 30-59 ml/min (HCC)   Relevant Orders   Renal Function Panel (Completed)   Ambulatory referral to Nephrology   Hyperlipidemia associated with type 2 diabetes mellitus (HCC)   Repeat lipid panel: LDL at goal Maintain atorvastatin  dose      Relevant Medications   empagliflozin  (JARDIANCE ) 10 MG TABS tablet   Other Relevant Orders   Lipid panel (Completed)   Hypertension - Primary (Chronic)   Relevant Orders   Renal Function Panel (Completed)   Post-traumatic osteoarthritis of right ankle   Relevant Orders   Ambulatory referral to Home Health   Spinal stenosis, lumbar region, without neurogenic claudication   Reports progressive weakness which makes completion of ADLs difficulty. She has difficulty with bathing an dressing due to limited ROM secondary to OA of shoulders and hands. She is requesting got personal aid to assist with ADLs Order entered      Relevant Orders   Ambulatory referral to Home Health   Type 2 diabetes mellitus with diabetic polyneuropathy, without long-term current use of insulin  (HCC) (Chronic)   Positive DIABETES nephropathy and neuropathy LDL at goal BP at goal Advised to schedule DM eye  exam Current use of furosemide  10mg  daily, Lisinopril  40mg , jardiance  10mg , ozempic  0.5mg  weekly  Repeat hgbA1c, UACr, and CMP: DIABETES controlled with hgbA1c at 5.5% Increased UACr and decline in renal function: 69.3mg /g and eGFr 45.75ml/min Maintain med doses Entered referral to nephrology F/up in 3months      Relevant Medications   empagliflozin  (JARDIANCE ) 10 MG TABS tablet   Other Visit Diagnoses       Asymptomatic postmenopausal estrogen deficiency       Relevant Orders   DG Bone Density     Generalized weakness       Relevant Orders   Ambulatory referral to Home Health      Return in about 3 months (around 10/15/2023) for HTN, DM, hyperlipidemia (repeat BMP and UA).     Kathrene Parents, NP

## 2023-07-16 ENCOUNTER — Other Ambulatory Visit: Payer: Self-pay | Admitting: Nurse Practitioner

## 2023-07-16 DIAGNOSIS — L304 Erythema intertrigo: Secondary | ICD-10-CM

## 2023-07-16 DIAGNOSIS — J4520 Mild intermittent asthma, uncomplicated: Secondary | ICD-10-CM

## 2023-07-16 DIAGNOSIS — I10 Essential (primary) hypertension: Secondary | ICD-10-CM

## 2023-07-17 ENCOUNTER — Ambulatory Visit: Payer: Self-pay | Admitting: Nurse Practitioner

## 2023-07-17 ENCOUNTER — Encounter: Payer: Self-pay | Admitting: Nurse Practitioner

## 2023-07-17 MED ORDER — EMPAGLIFLOZIN 10 MG PO TABS: 10.0000 mg | ORAL_TABLET | Freq: Every day | ORAL | 1 refills | Status: DC

## 2023-07-17 NOTE — Assessment & Plan Note (Signed)
 Chronic constipation and ABDOMEN pain. Under the care of GI

## 2023-07-17 NOTE — Assessment & Plan Note (Signed)
 Reports progressive weakness which makes completion of ADLs difficulty. She has difficulty with bathing an dressing due to limited ROM secondary to OA of shoulders and hands. She is requesting got personal aid to assist with ADLs Order entered

## 2023-07-17 NOTE — Assessment & Plan Note (Signed)
 Positive DIABETES nephropathy and neuropathy LDL at goal BP at goal Advised to schedule DM eye exam Current use of furosemide  10mg  daily, Lisinopril  40mg , jardiance  10mg , ozempic  0.5mg  weekly  Repeat hgbA1c, UACr, and CMP: DIABETES controlled with hgbA1c at 5.5% Increased UACr and decline in renal function: 69.3mg /g and eGFr 45.51ml/min Maintain med doses Entered referral to nephrology F/up in 3months

## 2023-07-17 NOTE — Assessment & Plan Note (Signed)
 Repeat lipid panel: LDL at goal ?Maintain atorvastatin dose ?

## 2023-07-23 ENCOUNTER — Encounter: Payer: Self-pay | Admitting: *Deleted

## 2023-07-27 NOTE — Progress Notes (Deleted)
 PATIENT: Sandra Brennan DOB: [birth year]  REASON FOR VISIT: follow up HISTORY FROM: patient PRIMARY NEUROLOGIST:   Virtual Visit via Video Note  I connected with Bebe Moncure Teas on 07/27/23 at  1:15 PM EDT by a video enabled telemedicine application located remotely at [facility]*** and verified that I am speaking with the correct person using two identifiers who was located at their own home***   I discussed the limitations of evaluation and management by telemedicine and the availability of in person appointments. The patient expressed understanding and agreed to proceed.   PATIENT: Sandra Brennan DOB: 02/21/1946  REASON FOR VISIT: follow up HISTORY FROM: patient  HISTORY OF PRESENT ILLNESS: Today  [name] is a 78 y.o. female with a history of OSA on CPAP. Returns today for follow-up.      HISTORY   REVIEW OF SYSTEMS: Out of a complete 14 system review of symptoms, the patient complains only of the following symptoms, and all other reviewed systems are negative.  ALLERGIES: Allergies  Allergen Reactions   Cymbalta  [Duloxetine  Hcl] Anaphylaxis   Pineapple Shortness Of Breath   Sulfa Antibiotics Hives, Shortness Of Breath and Itching   Influenza Vaccines Hives and Itching   Other Swelling and Other (See Comments)    Pork barbeque- Made the face swell   Penicillins Hives   Theophyllines Hives    HOME MEDICATIONS: Outpatient Medications Prior to Visit  Medication Sig Dispense Refill   acetaminophen  (TYLENOL  8 HOUR ARTHRITIS PAIN) 650 MG CR tablet Take 1,300 mg by mouth every 8 (eight) hours as needed (for headaches).     albuterol  (VENTOLIN  HFA) 108 (90 Base) MCG/ACT inhaler INHALE 1 PUFF INTO THE LUNGS EVERY 6 (SIX) HOURS AS NEEDED FOR SHORTNESS OF BREATH OR WHEEZING. 8.5 each 3   allopurinol  (ZYLOPRIM ) 100 MG tablet Take 1 tablet (100 mg total) by mouth daily. 90 tablet 3   amLODipine  (NORVASC ) 10 MG tablet TAKE 1 TABLET BY MOUTH  EVERYDAY AT BEDTIME 90 tablet 1   aspirin  EC 81 MG tablet Take 81 mg by mouth daily.     atorvastatin  (LIPITOR) 20 MG tablet Take 1 tablet (20 mg total) by mouth every evening. 90 tablet 3   cetirizine  (ZYRTEC ) 10 MG tablet Take 10 mg by mouth in the morning.     cholecalciferol  (VITAMIN D3) 25 MCG (1000 UNIT) tablet Take 1,000 Units by mouth daily.     empagliflozin  (JARDIANCE ) 10 MG TABS tablet Take 1 tablet (10 mg total) by mouth daily before breakfast. 90 tablet 1   escitalopram  (LEXAPRO ) 10 MG tablet TAKE 1 TABLET BY MOUTH EVERY DAY 90 tablet 2   furosemide  (LASIX ) 20 MG tablet TAKE 1/2 TABLET BY MOUTH DAILY 45 tablet 1   lisinopril  (ZESTRIL ) 40 MG tablet TAKE 1 TABLET BY MOUTH EVERY DAY 90 tablet 1   Multiple Vitamin (MULTIVITAMIN) tablet Take 1 tablet by mouth daily with breakfast.     nystatin  (MYCOSTATIN /NYSTOP ) powder APPLY TOPICALLY 3 TIMES A DAY 60 g 0   omeprazole  (PRILOSEC) 20 MG capsule TAKE 1 CAPSULE BY MOUTH DAILY BEFORE BREAKFAST. 90 capsule 1   OPCON-A  0.027-0.315 % SOLN Place 1 drop into both eyes 3 (three) times daily as needed (for itching).     polycarbophil (FIBERCON) 625 MG tablet Take 625 mg by mouth daily.     Polyethylene Glycol 3350  (MIRALAX  PO) Take 17 g by mouth as needed.     pregabalin  (LYRICA ) 150 MG capsule Take  1 capsule (150 mg total) by mouth 2 (two) times daily. 60 capsule 5   Rimegepant Sulfate  (NURTEC) 75 MG TBDP Take 1 tablet (75 mg total) by mouth as needed (take 1 at onset of headache, max is 75 in 24 hours). (Patient taking differently: Take 75 mg by mouth as needed (at the onset of a headache- max daily dose is 75 mg/24 hours).) 10 tablet 11   Semaglutide ,0.25 or 0.5MG /DOS, (OZEMPIC , 0.25 OR 0.5 MG/DOSE,) 2 MG/3ML SOPN INJECT 0.5MG  INTO THE SKIN EVERY TUESDAY 6 mL 1   SYMBICORT  160-4.5 MCG/ACT inhaler INHALE 2 PUFFS INTO THE LUNGS IN THE MORNING AND AT BEDTIME. RINSE MOUTH AFTER EACH USE 30.6 each 3   traMADol  (ULTRAM ) 50 MG tablet Take 1 tablet (50 mg  total) by mouth 3 (three) times daily. 90 tablet 5   No facility-administered medications prior to visit.    PAST MEDICAL HISTORY: Past Medical History:  Diagnosis Date   Acute nontraumatic kidney injury (HCC) 05/05/2013   Acute posthemorrhagic anemia 09/06/2012   Anemia    Arthritis    "plenty" (2012-08-31)   Asthma    Chest pain 03/30/2016   Chronic headaches    Chronic lower back pain    "they say I need a whole new spinal column" (08-31-2012)   COPD (chronic obstructive pulmonary disease) (HCC)    Degenerative arthritis    "all over" (08/31/2012)   Fx ankle    GERD (gastroesophageal reflux disease)    Glaucoma    Gout 05/05/2013   Hypertension    Migraines    "used to; totally stopped when I quit drinking 30 yr ago" (31-Aug-2012)   Myalgia and myositis    Myocardial infarction [facility]) 1992   2012/08/31 "mild MI when son died "   Obesity    Osteoporosis    "all over" (08/31/12)   Shortness of breath    when stressed.   Sleep apnea    "never did fix my machine; haven't used one for 5 years; I've lost 116# since then; no problems now" (2012-08-31)   Syncope 03/30/2016   Type II diabetes mellitus (HCC)    "borderline; don't test; take Metformin " (2012/08/31)    PAST SURGICAL HISTORY: Past Surgical History:  Procedure Laterality Date   ABDOMINAL HYSTERECTOMY  1990's   GANGLION CYST EXCISION Right 1980's   "wrist" (2012-08-31)   JOINT REPLACEMENT     KNEE LIGAMENT RECONSTRUCTION Right 1980's   TONSILLECTOMY  ~ 1954   TOTAL HIP ARTHROPLASTY Left August 31, 2012   TOTAL HIP ARTHROPLASTY Left Aug 31, 2012   Procedure: TOTAL HIP ARTHROPLASTY;  Surgeon: Shirlee Dotter, MD;  Location: [facility] OR;  Service: Orthopedics;  Laterality: Left;  Left Total Hip Arthroplasty   TOTAL HIP ARTHROPLASTY Left 08/28/2012   Procedure: Irrigation and Debridement hip ;  Surgeon: Arnie Lao, MD;  Location: [facility] OR;  Service: Orthopedics;  Laterality: Left;   TOTAL KNEE ARTHROPLASTY Left 2001   TOTAL KNEE  ARTHROPLASTY Right 2004    FAMILY HISTORY: Family History  Problem Relation Age of Onset   Arthritis Mother    Arthritis Father    Colon cancer Father    Diabetes Sister    Arthritis Sister    Diabetes Maternal Grandmother    Arthritis Maternal Grandmother    Diabetes Maternal Grandfather    Arthritis Maternal Grandfather    Migraines Neg Hx    Sleep apnea Neg Hx     SOCIAL HISTORY: Social History   Socioeconomic History   Marital status:  Widowed    Spouse name: Not on file   Number of children: 5   Years of education: PhD   Highest education level: Not on file  Occupational History   Occupation: Retired  Tobacco Use   Smoking status: Former    Current packs/day: 1.00    Types: Cigarettes   Smokeless tobacco: Never   Tobacco comments:    08/03/2012 "quit 30 years ago"  Vaping Use   Vaping status: Never Used  Substance and Sexual Activity   Alcohol use: No    Alcohol/week: 0.0 standard drinks of alcohol    Comment: 08/03/2012 "used to be a beeralcoholic"; stopped ~ 30 yr ago"   Drug use: No   Sexual activity: Never  Other Topics Concern   Not on file  Social History Narrative   Lives at home with her mother and caregiver.- mother past away 01/2021 and now lives alone.   Occasional use of caffeine.   Right-handed.   Social Drivers of Corporate investment banker Strain: Low Risk  (11/28/2022)   Overall Financial Resource Strain (CARDIA)    Difficulty of Paying Living Expenses: Not hard at all  Food Insecurity: No Food Insecurity (05/15/2023)   Hunger Vital Sign    Worried About Running Out of Food in the Last Year: Never true    Ran Out of Food in the Last Year: Never true  Transportation Needs: Unmet Transportation Needs (05/15/2023)   PRAPARE - Administrator, Civil Service (Medical): Yes    Lack of Transportation (Non-Medical): Yes  Physical Activity: Inactive (11/28/2022)   Exercise Vital Sign    Days of Exercise per Week: 0 days    Minutes of  Exercise per Session: 0 min  Stress: Stress Concern Present (11/28/2022)   [employer] of Occupational Health - Occupational Stress Questionnaire    Feeling of Stress : To some extent  Social Connections: Moderately Isolated (11/28/2022)   Social Connection and Isolation Panel [NHANES]    Frequency of Communication with Friends and Family: Never    Frequency of Social Gatherings with Friends and Family: More than three times a week    Attends Religious Services: More than 4 times per year    Active Member of [employer] or Organizations: No    Attends Banker Meetings: Never    Marital Status: Widowed  Intimate Partner Violence: Not At Risk (05/15/2023)   Humiliation, Afraid, Rape, and Kick questionnaire    Fear of Current or Ex-Partner: No    Emotionally Abused: No    Physically Abused: No    Sexually Abused: No      PHYSICAL EXAM Generalized: Well developed, in no acute distress   Neurological examination  Mentation: Alert oriented to time, place, history taking. Follows all commands speech and language fluent Cranial nerve II-XII:Extraocular movements were full. Facial symmetry noted. uvula tongue midline. Head turning and shoulder shrug  were normal and symmetric. Motor: Good strength throughout subjectively per patient Sensory: Sensory testing is intact to soft touch on all 4 extremities subjectively per patient Coordination: Cerebellar testing reveals good finger-nose-finger  Gait and station: Patient is able to stand from a seated position. gait is normal.  Reflexes: UTA  DIAGNOSTIC DATA (LABS, IMAGING, TESTING) - I reviewed patient records, labs, notes, testing and imaging myself where available.  Lab Results  Component Value Date   WBC 6.7 08/10/2022   HGB 12.0 08/10/2022   HCT 38.6 08/10/2022   MCV 90.8 08/10/2022   PLT 262  08/10/2022      Component Value Date/Time   NA 141 07/15/2023 1042   NA 142 07/14/2014 1014   K 4.0 07/15/2023 1042   CL 103  07/15/2023 1042   CO2 30 07/15/2023 1042   GLUCOSE 79 07/15/2023 1042   BUN 13 07/15/2023 1042   BUN 12 07/14/2014 1014   CREATININE 1.16 07/15/2023 1042   CREATININE 1.39 (H) 04/09/2018 1549   CALCIUM  9.1 07/15/2023 1042   PROT 6.8 01/15/2023 1020   PROT 6.9 07/14/2014 1014   ALBUMIN  4.2 07/15/2023 1042   ALBUMIN  4.3 07/14/2014 1014   AST 15 01/15/2023 1020   ALT 12 01/15/2023 1020   ALKPHOS 90 01/15/2023 1020   BILITOT 0.3 01/15/2023 1020   BILITOT 0.2 07/14/2014 1014   GFRNONAA 51 (L) 08/10/2022 1059   GFRAA 46 (L) 03/07/2019 2115   Lab Results  Component Value Date   CHOL 154 07/15/2023   HDL 69.40 07/15/2023   LDLCALC 70 07/15/2023   TRIG 71.0 07/15/2023   CHOLHDL 2 07/15/2023   Lab Results  Component Value Date   HGBA1C 5.5 07/15/2023   Lab Results  Component Value Date   VITAMINB12 877 08/21/2020   Lab Results  Component Value Date   TSH 1.59 04/05/2021      ASSESSMENT AND PLAN 78 y.o. year old female  has a past medical history of Acute nontraumatic kidney injury (HCC) (05/05/2013), Acute posthemorrhagic anemia (09/06/2012), Anemia, Arthritis, Asthma, Chest pain (03/30/2016), Chronic headaches, Chronic lower back pain, COPD (chronic obstructive pulmonary disease) (HCC), Degenerative arthritis, Fx ankle, GERD (gastroesophageal reflux disease), Glaucoma, Gout (05/05/2013), Hypertension, Migraines, Myalgia and myositis, Myocardial infarction (HCC) (1992), Obesity, Osteoporosis, Shortness of breath, Sleep apnea, Syncope (03/30/2016), and Type II diabetes mellitus (HCC). here with:  OSA on CPAP  CPAP compliance excellent Residual AHI is good Encouraged patient to continue using CPAP nightly and > 4 hours each night F/U in 1 year or sooner if needed   Clem Currier, MSN, NP-C 07/27/2023, 8:33 AM [facility] [address] [place], [place] [address] [phone]

## 2023-07-28 ENCOUNTER — Telehealth: Payer: 59 | Admitting: Adult Health

## 2023-07-29 ENCOUNTER — Telehealth: Payer: Self-pay

## 2023-07-29 NOTE — Telephone Encounter (Signed)
 Copied from CRM [id number]. Topic: Clinical - Medical Advice >> Jul 29, 2023  3:23 PM Sandra Brennan B wrote: Reason for CRM: pt called to speak with provider regarding Jardiance  medication. States ever since she has taken the medication she can not stop itiching. And been having a sore throat. Please is requesting a call back at [phone]

## 2023-07-31 NOTE — Telephone Encounter (Signed)
 Called patient an informed her of [name]'s comment to stop Jardiance  and I asked the questions per [facility].  Patient states that she has a rash on stomach, itching is all of body no swelling of face and/or lips, sore throat and felt like throat was closing and tried to wash mouth out caused for her to choke and vomit.

## 2023-07-31 NOTE — Telephone Encounter (Signed)
 Spoke with Kathrene Parents, NP and she stated to have the patient take Benadryl  25 mg as needed for itching, if patient has taken in the past and made her feel sleepy to take the Zyrtec  10 mg as needed. If symptoms of throat feeling closed and/or worsening continue instructed her to go emergency department

## 2023-08-03 ENCOUNTER — Encounter: Payer: Self-pay | Admitting: Gastroenterology

## 2023-08-03 ENCOUNTER — Ambulatory Visit (INDEPENDENT_AMBULATORY_CARE_PROVIDER_SITE_OTHER): Admitting: Gastroenterology

## 2023-08-03 VITALS — BP 140/66 | HR 68 | Ht 59.0 in | Wt 248.0 lb

## 2023-08-03 DIAGNOSIS — R194 Change in bowel habit: Secondary | ICD-10-CM

## 2023-08-03 DIAGNOSIS — R634 Abnormal weight loss: Secondary | ICD-10-CM | POA: Diagnosis not present

## 2023-08-03 DIAGNOSIS — R1084 Generalized abdominal pain: Secondary | ICD-10-CM | POA: Diagnosis not present

## 2023-08-03 DIAGNOSIS — R630 Anorexia: Secondary | ICD-10-CM

## 2023-08-03 DIAGNOSIS — N39490 Overflow incontinence: Secondary | ICD-10-CM

## 2023-08-03 DIAGNOSIS — K529 Noninfective gastroenteritis and colitis, unspecified: Secondary | ICD-10-CM | POA: Diagnosis not present

## 2023-08-03 NOTE — Progress Notes (Signed)
 Chief Complaint:chronic diarrhea Primary GI Doctor:Dr. Venice Gillis  HPI: [name] with a past medical history of type 2 diabetes with hyperlipidemia, CKD stage IIIb, obesity, OSA/COPD, 07/07/2022 echocardiogram EF 55 to 60% grade 1 diastolic normal valves, constipation, personal history of colon polyps and others listed below presents for follow up on chronic diarrhea.  Patient last seen in GI office by Mylinda Asa, PA on 05/14/23 for diarrhea and abdominal pain. Abd x-ray showed moderate formed stool in colon. Recommend Miralax  with Benefiber for obstipation.  Previously saw Dr. Kimble Pennant with [facility] 2023 Reviewing [facility] notes patient had esophageal dysmotility with a barium swallow Screening Cologuard test 12/05/2021 was negative. Colonoscopy 03/2011 showed benign lymphoid polyp. No precancerous polyps. Good prep.  Cologuard 12/05/2021 that was negative recall 3 years  Interval History   Patient presents for follow-up on complaint of diarrhea. I reviewed the abdominal xray and explained to her how obstipation works. She was instructed to get Miralax  , she states she cannot afford to take it daily so has been taking it periodically. She has been doing vegetable tablets po daily along with Benefiber. She reports she has had more regular bowel movements, but continues with lower abdominal discomfort relieved with bowel movement. She states she has to get to bathroom quickly after eating no matter what she eats. She has accidents not getting to restroom in time and states due to using walker and hip pain it is hard for her to get to restroom fast enough. She wears panty liners.   She also reports poor appetite and weight loss. Per records she has lost 15 lbs in 3 weeks. She denies nausea. She has been drinking boost and states she forces herself to eat. She will eat one meal most days which consists of cereal or ritz crackers.  She is on Semaglutide , but states she has been on same dose for a  year.  She admits to more stress the past 2 years. She lost her son, her mother, and sister along with few other family members. 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 She states she has good support at her church. She states she also takes care of her 8 year grandson Monday- Friday.  Wt Readings from Last 3 Encounters:  08/03/23 248 lb (112.5 kg)  07/15/23 263 lb 3.2 oz (119.4 kg)  07/08/23 262 lb (118.8 kg)    Past Medical History:  Diagnosis Date   Acute nontraumatic kidney injury (HCC) 05/05/2013   Acute posthemorrhagic anemia 09/06/2012   Anemia    Arthritis    "plenty" (08/13/12)   Asthma    Chest pain 03/30/2016   Chronic headaches    Chronic lower back pain    "they say I need a whole new spinal column" (2012-08-13)   COPD (chronic obstructive pulmonary disease) (HCC)    Degenerative arthritis    "all over" (08-13-12)   Fx ankle    GERD (gastroesophageal reflux disease)    Glaucoma    Gout 05/05/2013   Hypertension    Migraines    "used to; totally stopped when I quit drinking 30 yr ago" (08-13-2012)   Myalgia and myositis    Myocardial infarction [facility]) 1992   08/13/2012 "mild MI when son died "   Obesity    Osteoporosis    "all over" (August 13, 2012)   Shortness of breath    when stressed.   Sleep apnea    "never did fix my machine; haven't used one for 5  years; I've lost 116# since then; no problems now" (08/03/2012)   Syncope 03/30/2016   Type II diabetes mellitus (HCC)    "borderline; don't test; take Metformin " (08/03/2012)    Past Surgical History:  Procedure Laterality Date   ABDOMINAL HYSTERECTOMY  1990's   GANGLION CYST EXCISION Right 1980's   "wrist" (08/03/2012)   JOINT REPLACEMENT     KNEE LIGAMENT RECONSTRUCTION Right 1980's   TONSILLECTOMY  ~ 1954   TOTAL HIP ARTHROPLASTY Left 08/03/2012   TOTAL HIP ARTHROPLASTY Left 08/03/2012   Procedure: TOTAL HIP ARTHROPLASTY;  Surgeon: Shirlee Dotter, MD;  Location: [facility] OR;  Service: Orthopedics;   Laterality: Left;  Left Total Hip Arthroplasty   TOTAL HIP ARTHROPLASTY Left 08/28/2012   Procedure: Irrigation and Debridement hip ;  Surgeon: Arnie Lao, MD;  Location: [facility] OR;  Service: Orthopedics;  Laterality: Left;   TOTAL KNEE ARTHROPLASTY Left 2001   TOTAL KNEE ARTHROPLASTY Right 2004    Current Outpatient Medications  Medication Sig Dispense Refill   acetaminophen  (TYLENOL  8 HOUR ARTHRITIS PAIN) 650 MG CR tablet Take 1,300 mg by mouth every 8 (eight) hours as needed (for headaches).     albuterol  (VENTOLIN  HFA) 108 (90 Base) MCG/ACT inhaler INHALE 1 PUFF INTO THE LUNGS EVERY 6 (SIX) HOURS AS NEEDED FOR SHORTNESS OF BREATH OR WHEEZING. 8.5 each 3   allopurinol  (ZYLOPRIM ) 100 MG tablet Take 1 tablet (100 mg total) by mouth daily. 90 tablet 3   amLODipine  (NORVASC ) 10 MG tablet TAKE 1 TABLET BY MOUTH EVERYDAY AT BEDTIME 90 tablet 1   aspirin  EC 81 MG tablet Take 81 mg by mouth daily.     atorvastatin  (LIPITOR) 20 MG tablet Take 1 tablet (20 mg total) by mouth every evening. 90 tablet 3   cetirizine  (ZYRTEC ) 10 MG tablet Take 10 mg by mouth in the morning.     cholecalciferol  (VITAMIN D3) 25 MCG (1000 UNIT) tablet Take 1,000 Units by mouth daily.     escitalopram  (LEXAPRO ) 10 MG tablet TAKE 1 TABLET BY MOUTH EVERY DAY 90 tablet 2   furosemide  (LASIX ) 20 MG tablet TAKE 1/2 TABLET BY MOUTH DAILY 45 tablet 1   lisinopril  (ZESTRIL ) 40 MG tablet TAKE 1 TABLET BY MOUTH EVERY DAY 90 tablet 1   Multiple Vitamin (MULTIVITAMIN) tablet Take 1 tablet by mouth daily with breakfast.     nystatin  (MYCOSTATIN /NYSTOP ) powder APPLY TOPICALLY 3 TIMES A DAY 60 g 0   omeprazole  (PRILOSEC) 20 MG capsule TAKE 1 CAPSULE BY MOUTH DAILY BEFORE BREAKFAST. 90 capsule 1   OPCON-A  0.027-0.315 % SOLN Place 1 drop into both eyes 3 (three) times daily as needed (for itching).     polycarbophil (FIBERCON) 625 MG tablet Take 625 mg by mouth daily.     Polyethylene Glycol 3350  (MIRALAX  PO) Take 17 g by mouth as  needed.     Rimegepant Sulfate  (NURTEC) 75 MG TBDP Take 1 tablet (75 mg total) by mouth as needed (take 1 at onset of headache, max is 75 in 24 hours). (Patient taking differently: Take 75 mg by mouth as needed (at the onset of a headache- max daily dose is 75 mg/24 hours).) 10 tablet 11   Semaglutide ,0.25 or 0.5MG /DOS, (OZEMPIC , 0.25 OR 0.5 MG/DOSE,) 2 MG/3ML SOPN INJECT 0.5MG  INTO THE SKIN EVERY TUESDAY 6 mL 1   SYMBICORT  160-4.5 MCG/ACT inhaler INHALE 2 PUFFS INTO THE LUNGS IN THE MORNING AND AT BEDTIME. RINSE MOUTH AFTER EACH USE 30.6 each 3   traMADol  (ULTRAM )  50 MG tablet Take 1 tablet (50 mg total) by mouth 3 (three) times daily. 90 tablet 5   No current facility-administered medications for this visit.    Allergies as of 08/03/2023 - Review Complete 08/03/2023  Allergen Reaction Noted   Cymbalta  [duloxetine  hcl] Anaphylaxis 08/16/2014   Pineapple Shortness Of Breath 08/31/2017   Sulfa antibiotics Hives, Shortness Of Breath, and Itching 02/24/2012   Influenza vaccines Hives and Itching 05/01/2016   Other Swelling and Other (See Comments) 07/06/2022   Penicillins Hives 11/07/2011   Theophyllines Hives 11/07/2011    Family History  Problem Relation Age of Onset   Arthritis Mother    Arthritis Father    Colon cancer Father    Diabetes Sister    Arthritis Sister    Diabetes Maternal Grandmother    Arthritis Maternal Grandmother    Diabetes Maternal Grandfather    Arthritis Maternal Grandfather    Migraines Neg Hx    Sleep apnea Neg Hx     Review of Systems:    Constitutional: No weight loss, fever, chills, weakness or fatigue HEENT: Eyes: No change in vision               Ears, Nose, Throat:  No change in hearing or congestion Skin: No rash or itching Cardiovascular: No chest pain, chest pressure or palpitations   Respiratory: No SOB or cough Gastrointestinal: See HPI and otherwise negative Genitourinary: No dysuria or change in urinary frequency Neurological: No  headache, dizziness or syncope Musculoskeletal: No new muscle or joint pain Hematologic: No bleeding or bruising Psychiatric: No history of depression or anxiety    Physical Exam:  Vital signs: BP (!) 140/66   Pulse 68   Ht 4\' 11"  (1.499 m)   Wt 248 lb (112.5 kg)   BMI 50.09 kg/m   Constitutional:  Pleasant A.A. female appears to be in NAD, Well developed, Well nourished, alert and cooperative Throat: Oral cavity and pharynx without inflammation, swelling or lesion.  Respiratory: Respirations even and unlabored. Lungs clear to auscultation bilaterally.   No wheezes, crackles, or rhonchi.  Cardiovascular: Normal S1, S2. Regular rate and rhythm. No peripheral edema, cyanosis or pallor.  Gastrointestinal:  Soft, nondistended, nontender. No rebound or guarding. Normal bowel sounds. No appreciable masses or hepatomegaly. Rectal:  Not performed.  Msk:  uses walker Neurologic:  Alert and  oriented x4;  grossly normal neurologically.  Skin:   Dry and intact without significant lesions or rashes. Psychiatric: Oriented to person, place and time. Demonstrates good judgement and reason without abnormal affect or behaviors.  RELEVANT LABS AND IMAGING: CBC    Latest Ref Rng & Units 08/10/2022   10:59 AM 07/07/2022    1:44 AM 07/06/2022    2:25 PM  CBC  WBC 4.0 - 10.5 K/uL 6.7  7.0  6.6   Hemoglobin 12.0 - 15.0 g/dL 16.1  09.6  04.5   Hematocrit 36.0 - 46.0 % 38.6  36.1  40.1   Platelets 150 - 400 K/uL 262  204  220      CMP     Latest Ref Rng & Units 07/15/2023   10:42 AM 01/15/2023   10:20 AM 08/10/2022   10:59 AM  CMP  Glucose 70 - 99 mg/dL 79  76  82   BUN 6 - 23 mg/dL 13  11  8    Creatinine 0.40 - 1.20 mg/dL 4.09  8.11  9.14   Sodium 135 - 145 mEq/L 141  143  137  Potassium 3.5 - 5.1 mEq/L 4.0  4.9  3.9   Chloride 96 - 112 mEq/L 103  105  103   CO2 19 - 32 mEq/L 30  33  22   Calcium  8.4 - 10.5 mg/dL 9.1  9.1  9.2   Total Protein 6.0 - 8.3 g/dL  6.8    Total Bilirubin 0.2 - 1.2  mg/dL  0.3    Alkaline Phos 39 - 117 U/L  90    AST 0 - 37 U/L  15    ALT 0 - 35 U/L  12       Lab Results  Component Value Date   TSH 1.59 04/05/2021     Assessment: Encounter Diagnoses  Name Primary?   Loss of weight Yes   Poor appetite    Generalized abdominal pain    Altered bowel habits    Overflow incontinence       78 year old A. A.  female patient who presents for follow-up on chronic diarrhea. Recent abdominal xray showed obstipation and I explained how this presents. Unfortunately she cannot afford taking Miralax  with Benefiber daily so we discussed doing it every other day. Will also give samples of IBgard with meals (she does not remember getting any last visit).     Due to significant weight loss over [redacted] weeks along with abdominal pain and altered bowel habits will proceed with CT AB/pelvis to r/o diverticulitis or other acute cause. Advised she consume nutritional supplements like protein shakes BID-TID.  Plan: - Continue Miralax  every other day, Maila Dukes be more cost effective -Continue Benefiber po daily -Can do trial of IBGARD daily, will give samples -Protein shakes BID-TID -will schedule for CT AB and pelvis   Thank you for the courtesy of this consult. Please call me with any questions or concerns.   Haileyann Staiger, FNP-C [facility] 08/03/2023, 9:21 AM  Cc: Kandace Organ, NP

## 2023-08-03 NOTE — Patient Instructions (Addendum)
 Continue Miralax  every other day, may be more cost effective Continue Benefiber po daily helps keep stools more formed OTC IBgard daily, take 2 with meals Continue Protein shakes two times daily  You have been scheduled for a CT scan of the abdomen and pelvis at [facility], 1st floor Radiology. You are scheduled on 6/10 at 4:30. You should arrive 2 hours (2:30pm) prior to your appointment time for registration.   The purpose of you drinking the oral contrast is to aid in the visualization of your intestinal tract. The contrast solution may cause some diarrhea. Depending on your individual set of symptoms, you may also receive an intravenous injection of x-ray contrast/dye. Plan on being at [facility] for 45 minutes or longer, depending on the type of exam you are having performed.   If you have any questions regarding your exam or if you need to reschedule, you may call [facility] at [phone] between the hours of 8:00 am and 5:00 pm, Monday-Friday.   _______________________________________________________  If your blood pressure at your visit was 140/90 or greater, please contact your primary care physician to follow up on this.  _______________________________________________________  If you are age 57 or older, your body mass index should be between 23-30. Your Body mass index is 50.09 kg/m. If this is out of the aforementioned range listed, please consider follow up with your Primary Care Provider.  If you are age 84 or younger, your body mass index should be between 19-25. Your Body mass index is 50.09 kg/m. If this is out of the aformentioned range listed, please consider follow up with your Primary Care Provider.   ________________________________________________________  The [facility] providers would like to encourage you to use MYCHART to communicate with providers for non-urgent requests or questions.  Due to long hold times on the telephone, sending your  provider a message by [facility] may be a faster and more efficient way to get a response.  Please allow 48 business hours for a response.  Please remember that this is for non-urgent requests.  _______________________________________________________  Thank you for trusting me with your gastrointestinal care!    Devin Foerster May, NP

## 2023-08-05 ENCOUNTER — Telehealth: Payer: Self-pay

## 2023-08-07 ENCOUNTER — Telehealth: Payer: Self-pay

## 2023-08-07 NOTE — Telephone Encounter (Signed)
 Copied from CRM [id number]. Topic: Clinical - Home Health Verbal Orders >> Aug 07, 2023 10:08 AM Alyse July wrote: Caller/Agency: [facility]  Callback Number: [phone] Service Requested: Skilled Nursing Frequency: 1 week for 9 weeks  Any new concerns about the patient? Yes focus on diabetic management, hypertension and COPD safety in home and medication administration also requesting PT and home health aid

## 2023-08-10 NOTE — Telephone Encounter (Signed)
 Called [facility] and spoke with Jacumba and informed her that Kathrene Parents, NP gave her a verbal agreeable of  skilled nursing with focus of diabetic management, hypertension and COPD safety in home and medication administration also requesting PT and home health aid for 1 week for 9 weeks. She thanked me for calling and asked to spell my name for her records.

## 2023-08-11 ENCOUNTER — Ambulatory Visit (HOSPITAL_COMMUNITY)
Admission: RE | Admit: 2023-08-11 | Discharge: 2023-08-11 | Disposition: A | Discharge status: Home / Self Care | Source: Ambulatory Visit | Attending: Gastroenterology | Admitting: Gastroenterology

## 2023-08-11 DIAGNOSIS — N39490 Overflow incontinence: Secondary | ICD-10-CM | POA: Diagnosis present

## 2023-08-11 DIAGNOSIS — R1084 Generalized abdominal pain: Secondary | ICD-10-CM | POA: Insufficient documentation

## 2023-08-11 DIAGNOSIS — R634 Abnormal weight loss: Secondary | ICD-10-CM | POA: Insufficient documentation

## 2023-08-11 MED ORDER — IOHEXOL 9 MG/ML PO SOLN
1000.0000 mL | ORAL | Status: AC
  Administered 2023-08-11: 1000 mL via ORAL

## 2023-08-11 MED ORDER — IOHEXOL 9 MG/ML PO SOLN
ORAL | Status: AC
Start: 2023-08-11 — End: ?
  Filled 2023-08-11: qty 1000

## 2023-08-11 MED ORDER — IOHEXOL 300 MG/ML  SOLN
100.0000 mL | Freq: Once | INTRAMUSCULAR | Status: AC | PRN
  Administered 2023-08-11: 100 mL via INTRAVENOUS

## 2023-08-13 ENCOUNTER — Ambulatory Visit: Payer: Self-pay | Admitting: Gastroenterology

## 2023-08-14 NOTE — Telephone Encounter (Signed)
-----   Message from Devin Foerster May sent at 08/13/2023  4:45 PM EDT ----- Sandra Brennan Please let the patient know that her CT scan did not show any cause of the weight loss.  She does have a small hiatal hernia.  She does have left-sided colonic diverticulosis but no signs of  infection or diverticulitis.  Patient does have gallstones but no evidence of inflammation in the gallbladder.  Her symptoms most likely are due to constipation and hopefully taking the MiraLAX  more  regularly has helped with some of her symptoms.  If she still having issues she can schedule a follow-up with Eulas Hick, NP ----- Message ----- From: Interface, Rad Results In Sent: 08/12/2023  10:33 AM EDT To: Devin Foerster May, NP

## 2023-08-18 ENCOUNTER — Telehealth: Payer: Self-pay

## 2023-08-18 NOTE — Telephone Encounter (Signed)
 Pt called wanting a refill of her pregablin.please advise.

## 2023-08-19 ENCOUNTER — Telehealth: Payer: Self-pay | Admitting: Registered Nurse

## 2023-08-19 NOTE — Telephone Encounter (Signed)
 Can you call Sandra Brennan and asked why she hasn't refilled her Pregabalin  since April 2025, .The pregabalin  is no longer on her medication list as well.

## 2023-08-19 NOTE — Telephone Encounter (Signed)
 PMP was Reviewed. Will have CMA  call Sandra Brennan regarding her Pregabalin , last fill date was in April 2025, with a refill.

## 2023-09-03 NOTE — Telephone Encounter (Signed)
-----   Message from Devin Foerster May sent at 08/13/2023  4:45 PM EDT ----- Sandra Brennan Please let the patient know that her CT scan did not show any cause of the weight loss.  She does have a small hiatal hernia.  She does have left-sided colonic diverticulosis but no signs of  infection or diverticulitis.  Patient does have gallstones but no evidence of inflammation in the gallbladder.  Her symptoms most likely are due to constipation and hopefully taking the MiraLAX  more  regularly has helped with some of her symptoms.  If she still having issues she can schedule a follow-up with Eulas Hick, NP ----- Message ----- From: Interface, Rad Results In Sent: 08/12/2023  10:33 AM EDT To: Devin Foerster May, NP

## 2023-09-08 ENCOUNTER — Telehealth: Payer: Self-pay | Admitting: Nurse Practitioner

## 2023-09-08 NOTE — Telephone Encounter (Signed)
 Patient dropped off document Home Health Certificate (Order ID [id number]), to be filled out by provider. Patient requested to send it back via Fax within 7-days. Document is located in providers tray at front office.Please advise at [phone]

## 2023-09-08 NOTE — Telephone Encounter (Signed)
CLINICAL USE BELOW THIS LINE (use X to signify taken)  __X__Form received and placed in providers office for signature. ____Form completed and faxed to LOA Dept. ____Form completed & LVM to notify pt ready for pick up ____Charge sheet & copy of form in front office folder for office supervisor.   

## 2023-09-09 ENCOUNTER — Encounter: Payer: Self-pay | Admitting: Nurse Practitioner

## 2023-09-15 ENCOUNTER — Other Ambulatory Visit: Payer: Self-pay | Admitting: Nurse Practitioner

## 2023-09-15 DIAGNOSIS — E66813 Obesity, class 3: Secondary | ICD-10-CM

## 2023-09-15 DIAGNOSIS — L304 Erythema intertrigo: Secondary | ICD-10-CM

## 2023-09-15 DIAGNOSIS — E1142 Type 2 diabetes mellitus with diabetic polyneuropathy: Secondary | ICD-10-CM

## 2023-09-15 DIAGNOSIS — K2100 Gastro-esophageal reflux disease with esophagitis, without bleeding: Secondary | ICD-10-CM

## 2023-10-08 ENCOUNTER — Other Ambulatory Visit: Payer: Self-pay | Admitting: Nurse Practitioner

## 2023-10-08 DIAGNOSIS — I10 Essential (primary) hypertension: Secondary | ICD-10-CM

## 2023-10-08 DIAGNOSIS — L304 Erythema intertrigo: Secondary | ICD-10-CM

## 2023-10-08 DIAGNOSIS — E1142 Type 2 diabetes mellitus with diabetic polyneuropathy: Secondary | ICD-10-CM

## 2023-10-08 DIAGNOSIS — F339 Major depressive disorder, recurrent, unspecified: Secondary | ICD-10-CM

## 2023-10-08 DIAGNOSIS — Z6841 Body Mass Index (BMI) 40.0 and over, adult: Secondary | ICD-10-CM

## 2023-10-15 ENCOUNTER — Encounter: Payer: Self-pay | Admitting: Nurse Practitioner

## 2023-10-15 ENCOUNTER — Ambulatory Visit: Admitting: Nurse Practitioner

## 2023-10-15 VITALS — BP 138/76 | HR 77 | Temp 98.5°F | Ht 59.0 in | Wt 243.4 lb

## 2023-10-15 DIAGNOSIS — E1169 Type 2 diabetes mellitus with other specified complication: Secondary | ICD-10-CM | POA: Diagnosis not present

## 2023-10-15 DIAGNOSIS — M1A9XX0 Chronic gout, unspecified, without tophus (tophi): Secondary | ICD-10-CM | POA: Diagnosis not present

## 2023-10-15 DIAGNOSIS — E785 Hyperlipidemia, unspecified: Secondary | ICD-10-CM

## 2023-10-15 DIAGNOSIS — Z7984 Long term (current) use of oral hypoglycemic drugs: Secondary | ICD-10-CM

## 2023-10-15 DIAGNOSIS — W19XXXA Unspecified fall, initial encounter: Secondary | ICD-10-CM

## 2023-10-15 DIAGNOSIS — E1142 Type 2 diabetes mellitus with diabetic polyneuropathy: Secondary | ICD-10-CM

## 2023-10-15 DIAGNOSIS — J4520 Mild intermittent asthma, uncomplicated: Secondary | ICD-10-CM

## 2023-10-15 DIAGNOSIS — Z7985 Long-term (current) use of injectable non-insulin antidiabetic drugs: Secondary | ICD-10-CM

## 2023-10-15 DIAGNOSIS — R222 Localized swelling, mass and lump, trunk: Secondary | ICD-10-CM

## 2023-10-15 MED ORDER — ATORVASTATIN CALCIUM 20 MG PO TABS
20.0000 mg | ORAL_TABLET | Freq: Every evening | ORAL | 3 refills | Status: AC
Start: 2023-10-15 — End: ?

## 2023-10-15 MED ORDER — SYMBICORT 160-4.5 MCG/ACT IN AERO: 2.0000 | INHALATION_SPRAY | Freq: Two times a day (BID) | RESPIRATORY_TRACT | 3 refills | Status: AC

## 2023-10-15 MED ORDER — ALLOPURINOL 100 MG PO TABS
100.0000 mg | ORAL_TABLET | Freq: Every day | ORAL | 3 refills | Status: AC
Start: 2023-10-15 — End: ?

## 2023-10-15 NOTE — Assessment & Plan Note (Signed)
LDL at goal Maintain atorvastatin dose 

## 2023-10-15 NOTE — Assessment & Plan Note (Signed)
 Requested lab results from nephrology Maintain allopurinol  dose

## 2023-10-15 NOTE — Progress Notes (Signed)
 Established Patient Visit  Patient: Sandra Brennan   DOB: January 22, 1946   78 y.o. Female  MRN: [id number] Visit Date: 10/15/2023  Subjective:    Chief Complaint  Patient presents with   Follow-up    3 month follow up for DM, HTN, Hyperlipidemia  Foot exam due patient thinking about it  Requested eye exam    Fall The accident occurred More than 1 week ago. The fall occurred while walking. She fell from an unknown height. She landed on Hard floor. There was no blood loss. The point of impact was the right hip. The pain is present in the back and right hip. The symptoms are aggravated by ambulation and sitting. Pertinent negatives include no abdominal pain, bowel incontinence, headaches, hematuria or loss of consciousness. She has tried acetaminophen  and NSAID for the symptoms.  Worsening right hip and lower back pain Eval by EMS but declined to go to the ED.  Asthma, chronic No acute exacebation Current use of symbicort  BID and albuterol  prn  Type 2 diabetes mellitus with diabetic polyneuropathy, without long-term current use of insulin  (HCC) Positive DIABETES nephropathy and neuropathy Under the care of nephrology LDL at goal BP at goal Advised to schedule DM eye exam Current use of furosemide  10mg  daily, Lisinopril  40mg , jardiance  10mg , ozempic  0.5mg  weekly  Last hgbA1c at 5.5% Maintain med doses Requested CMP, uric acid results from nephrology F/up in 3months  Hyperlipidemia associated with type 2 diabetes mellitus (HCC) LDL at goal Maintain atorvastatin  dose  Chronic gouty arthritis Requested lab results from nephrology Maintain allopurinol  dose  BP Readings from Last 3 Encounters:  10/15/23 138/76  08/03/23 (!) 140/66  07/15/23 138/74    Wt Readings from Last 3 Encounters:  10/15/23 243 lb 6.4 oz (110.4 kg)  08/03/23 248 lb (112.5 kg)  07/15/23 263 lb 3.2 oz (119.4 kg)    Reviewed medical, surgical, and social history  today  Medications: Outpatient Medications Prior to Visit  Medication Sig   acetaminophen  (TYLENOL  8 HOUR ARTHRITIS PAIN) 650 MG CR tablet Take 1,300 mg by mouth every 8 (eight) hours as needed (for headaches).   albuterol  (VENTOLIN  HFA) 108 (90 Base) MCG/ACT inhaler INHALE 1 PUFF INTO THE LUNGS EVERY 6 (SIX) HOURS AS NEEDED FOR SHORTNESS OF BREATH OR WHEEZING.   amLODipine  (NORVASC ) 10 MG tablet TAKE 1 TABLET BY MOUTH EVERYDAY AT BEDTIME   aspirin  EC 81 MG tablet Take 81 mg by mouth daily.   cetirizine  (ZYRTEC ) 10 MG tablet Take 10 mg by mouth in the morning.   cholecalciferol  (VITAMIN D3) 25 MCG (1000 UNIT) tablet Take 1,000 Units by mouth daily.   escitalopram  (LEXAPRO ) 10 MG tablet TAKE 1 TABLET BY MOUTH EVERY DAY   furosemide  (LASIX ) 20 MG tablet TAKE 1/2 TABLET BY MOUTH DAILY   lisinopril  (ZESTRIL ) 40 MG tablet TAKE 1 TABLET BY MOUTH EVERY DAY   Multiple Vitamin (MULTIVITAMIN) tablet Take 1 tablet by mouth daily with breakfast.   nystatin  (MYCOSTATIN /NYSTOP ) powder APPLY TOPICALLY 3 TIMES A DAY   omeprazole  (PRILOSEC) 20 MG capsule TAKE 1 CAPSULE BY MOUTH DAILY BEFORE BREAKFAST.   OPCON-A  0.027-0.315 % SOLN Place 1 drop into both eyes 3 (three) times daily as needed (for itching).   polycarbophil (FIBERCON) 625 MG tablet Take 625 mg by mouth daily.   Polyethylene Glycol 3350  (MIRALAX  PO) Take 17 g by mouth as needed.   Rimegepant Sulfate  (NURTEC) 75 MG TBDP  Take 1 tablet (75 mg total) by mouth as needed (take 1 at onset of headache, max is 75 in 24 hours).   Semaglutide ,0.25 or 0.5MG /DOS, (OZEMPIC , 0.25 OR 0.5 MG/DOSE,) 2 MG/3ML SOPN INJECT 0.5MG  INTO THE SKIN EVERY TUESDAY   traMADol  (ULTRAM ) 50 MG tablet Take 1 tablet (50 mg total) by mouth 3 (three) times daily.   [DISCONTINUED] allopurinol  (ZYLOPRIM ) 100 MG tablet Take 1 tablet (100 mg total) by mouth daily.   [DISCONTINUED] atorvastatin  (LIPITOR) 20 MG tablet Take 1 tablet (20 mg total) by mouth every evening.   [DISCONTINUED]  SYMBICORT  160-4.5 MCG/ACT inhaler INHALE 2 PUFFS INTO THE LUNGS IN THE MORNING AND AT BEDTIME. RINSE MOUTH AFTER EACH USE   No facility-administered medications prior to visit.   Reviewed past medical and social history.   ROS per HPI above      Objective:  BP 138/76 (BP Location: Left Arm, Patient Position: Sitting, Cuff Size: Large)   Pulse 77   Temp 98.5 F (36.9 C) (Oral)   Ht 4' 11 (1.499 m)   Wt 243 lb 6.4 oz (110.4 kg)   SpO2 95%   BMI 49.16 kg/m      Physical Exam Vitals and nursing note reviewed.  Constitutional:      Appearance: She is obese.  Cardiovascular:     Rate and Rhythm: Normal rate and regular rhythm.     Pulses: Normal pulses.     Heart sounds: Normal heart sounds.  Pulmonary:     Effort: Pulmonary effort is normal.     Breath sounds: Normal breath sounds.  Musculoskeletal:     Right lower leg: No edema.     Left lower leg: No edema.  Neurological:     Mental Status: She is alert and oriented to person, place, and time.  Psychiatric:        Mood and Affect: Mood normal.        Behavior: Behavior normal.        Thought Content: Thought content normal.     No results found for any visits on 10/15/23.    Assessment & Plan:    Problem List Items Addressed This Visit     Asthma, chronic (Chronic)   No acute exacebation Current use of symbicort  BID and albuterol  prn      Relevant Medications   SYMBICORT  160-4.5 MCG/ACT inhaler   Chronic gouty arthritis   Requested lab results from nephrology Maintain allopurinol  dose      Relevant Medications   allopurinol  (ZYLOPRIM ) 100 MG tablet   Hyperlipidemia associated with type 2 diabetes mellitus (HCC)   LDL at goal Maintain atorvastatin  dose      Relevant Medications   atorvastatin  (LIPITOR) 20 MG tablet   Type 2 diabetes mellitus with diabetic polyneuropathy, without long-term current use of insulin  (HCC) - Primary (Chronic)   Positive DIABETES nephropathy and neuropathy Under the  care of nephrology LDL at goal BP at goal Advised to schedule DM eye exam Current use of furosemide  10mg  daily, Lisinopril  40mg , jardiance  10mg , ozempic  0.5mg  weekly  Last hgbA1c at 5.5% Maintain med doses Requested CMP, uric acid results from nephrology F/up in 3months      Relevant Medications   atorvastatin  (LIPITOR) 20 MG tablet   Other Visit Diagnoses       Fall, initial encounter       Relevant Orders   DG Hip Unilat W OR W/O Pelvis 2-3 Views Right   DG Lumbar Spine Complete  Subcutaneous mass of abdominal wall       Relevant Orders   US  Abdomen Limited      Return in about 3 months (around 01/15/2024) for HTN, DM, hyperlipidemia (fasting).     Roselie Mood, NP

## 2023-10-15 NOTE — Assessment & Plan Note (Signed)
 Positive DIABETES nephropathy and neuropathy Under the care of nephrology LDL at goal BP at goal Advised to schedule DM eye exam Current use of furosemide  10mg  daily, Lisinopril  40mg , jardiance  10mg , ozempic  0.5mg  weekly  Last hgbA1c at 5.5% Maintain med doses Requested CMP, uric acid results from nephrology F/up in 3months

## 2023-10-15 NOTE — Assessment & Plan Note (Signed)
 No acute exacebation Current use of symbicort  BID and albuterol  prn

## 2023-10-15 NOTE — Patient Instructions (Addendum)
 Go to [address] for x-ray of back and right hip Schedule appointment for repeat dexa scan with Horatio You will be contacted to schedule appointment for ABDOMEN US .  Use systane complete or refresh eye drops for dry and itchy eyes  Maintain current med doses

## 2023-10-22 ENCOUNTER — Ambulatory Visit
Admission: RE | Admit: 2023-10-22 | Discharge: 2023-10-22 | Disposition: A | Discharge status: Home / Self Care | Source: Ambulatory Visit | Attending: Nurse Practitioner

## 2023-10-22 DIAGNOSIS — W19XXXA Unspecified fall, initial encounter: Secondary | ICD-10-CM

## 2023-10-22 DIAGNOSIS — M545 Low back pain, unspecified: Secondary | ICD-10-CM | POA: Diagnosis not present

## 2023-10-22 DIAGNOSIS — M25551 Pain in right hip: Secondary | ICD-10-CM | POA: Diagnosis not present

## 2023-10-23 ENCOUNTER — Telehealth: Payer: Self-pay | Admitting: Nurse Practitioner

## 2023-10-23 NOTE — Telephone Encounter (Signed)
 Called patient and informed her of why I was calling. She stated that she did not have any scans on her breast that her daughter told me wrong that the scans were done on her hips and back. She stated that the nurse from [employer] her insurance came out to her home and she was informing her of the knot Roselie found at the last office visit that was on/near breast. She is not sure what the report stated and asked if Roselie still wants her to have the appointment.

## 2023-10-23 NOTE — Telephone Encounter (Signed)
 Called and left a voice message per DPR on file asking patient to give me a call back at the office at [phone].  Also called patient's daughter Stoney who is on DPR on file. I informed her why I was calling and she stated that the report is correct that she went with her mother to get it done. She stated that she will give her mother a call and have her give me a call back at the office at [phone]. I thanked her for taking my call.

## 2023-10-23 NOTE — Telephone Encounter (Signed)
 A report from [facility] notes a right breast cyst. Is this accurate? If yes, advise to schedule an appointment.  CN

## 2023-10-28 NOTE — Telephone Encounter (Signed)
 Called patient and left a voice message per DPR on file asking to give me a call back at the office at [phone]

## 2023-11-03 ENCOUNTER — Ambulatory Visit
Admission: RE | Admit: 2023-11-03 | Discharge: 2023-11-03 | Disposition: A | Discharge status: Home / Self Care | Source: Ambulatory Visit | Attending: Nurse Practitioner | Admitting: Nurse Practitioner

## 2023-11-03 DIAGNOSIS — R222 Localized swelling, mass and lump, trunk: Secondary | ICD-10-CM

## 2023-11-04 ENCOUNTER — Other Ambulatory Visit: Payer: Self-pay | Admitting: Nurse Practitioner

## 2023-11-04 DIAGNOSIS — L304 Erythema intertrigo: Secondary | ICD-10-CM

## 2023-11-05 ENCOUNTER — Ambulatory Visit: Payer: Self-pay | Admitting: Nurse Practitioner

## 2023-11-05 DIAGNOSIS — L723 Sebaceous cyst: Secondary | ICD-10-CM

## 2023-11-06 NOTE — Telephone Encounter (Addendum)
 Called and left a voice message per DPR on file for patient asking to give me a call back at the office at [phone] that it's pertaining to her recent Abdomen US .  ----- Message from Roselie Mood sent at 11/05/2023  3:34 PM EDT ----- US  confirms sebaceous cyst. Are you experiencing any pain or drainage from this cyst? ----- Message ----- From: Interface, Rad Results In Sent: 11/04/2023   8:27 PM EDT To: Roselie Bishop Mood, NP

## 2023-11-17 NOTE — Telephone Encounter (Signed)
 Called patient and has been notified of the result and verbalized understanding.  All questions (if any) were answered.

## 2023-11-17 NOTE — Addendum Note (Signed)
 Addended by: KATHEEN GARDEN L on: 11/17/2023 02:11 PM   Modules accepted: Orders

## 2023-11-17 NOTE — Telephone Encounter (Unsigned)
 Copied from CRM #[id number]. Topic: Clinical - Lab/Test Results >> Nov 17, 2023 10:35 AM Laymon HERO wrote: Reason for CRM: Patient asking for Lenon Roughen, CMA to call back about test results

## 2023-11-26 ENCOUNTER — Other Ambulatory Visit: Payer: Self-pay | Admitting: Surgery

## 2023-11-26 NOTE — Progress Notes (Signed)
 REFERRING PHYSICIAN:  Nche, Roselie Rockford, NP PROVIDER:  VICENTA DASIE POLI, MD MRN: [id number] DOB: 1945-07-03 DATE OF ENCOUNTER: 11/26/2023 Subjective    Chief Complaint: New Consultation   History of Present Illness: Sandra Brennan is a 77 y.o. female who is seen today as an office consultation for evaluation of New Consultation   This is a 78 year old female was referred to me for a painful nodule underneath her right breast.  She is uncertain how long it has been there but she reports it is now getting bigger and is causing discomfort especially with the breast laying on top of it.  She underwent an ultrasound showing a small 1 cm mass in this area.  She has had no previous problem regarding her breast.  There is a strong family history of breast cancer which she did not specify    Review of Systems: A complete review of systems was obtained from the patient.  I have reviewed this information and discussed as appropriate with the patient.  See HPI as well for other ROS.  ROS   Medical History: Past Medical History:  Diagnosis Date  . Anemia   . Arthritis   . Asthma, unspecified asthma severity, unspecified whether complicated, unspecified whether persistent (HHS-HCC)   . COPD (chronic obstructive pulmonary disease) (CMS/HHS-HCC)   . Diabetes mellitus without complication (CMS/HHS-HCC)   . GERD (gastroesophageal reflux disease)   . Hypertension   . Sleep apnea     There is no problem list on file for this patient.   Past Surgical History:  Procedure Laterality Date  . HYSTERECTOMY    . JOINT REPLACEMENT       Allergies  Allergen Reactions  . Duloxetine  Anaphylaxis  . Pineapple Shortness Of Breath  . Empagliflozin  Itching    Sore throat  . Influenza Virus Vaccines Hives, Itching and Unknown  . Penicillins Hives and Unknown  . Sulfacetamide Sodium Other (See Comments)    REACTION: unknown    Current Outpatient Medications on File Prior to Visit  Medication  Sig Dispense Refill  . acetaminophen  (TYLENOL ) 650 MG ER tablet Take 1,300 mg by mouth    . albuterol  MDI, PROVENTIL , VENTOLIN , PROAIR , HFA (PROAIR  HFA) 90 mcg/actuation inhaler Inhale 2 inhalations into the lungs 2 (two) times daily    . allopurinoL  (ZYLOPRIM ) 100 MG tablet Take 100 mg by mouth once daily    . amLODIPine  (NORVASC ) 10 MG tablet Take 10 mg by mouth at bedtime    . aspirin  81 MG EC tablet Take 81 mg by mouth once daily    . atorvastatin  (LIPITOR) 20 MG tablet Take 20 mg by mouth every morning    . calcium  polycarbophiL (FIBERCON) 625 mg tablet Take 625 mg by mouth once daily    . cetirizine  (ZYRTEC ) 10 MG tablet Take 10 mg by mouth once daily    . cholecalciferol  (VITAMIN D3) 1000 unit tablet Take 1,000 Units by mouth once daily    . cycloSPORINE  (RESTASIS ) 0.05 % ophthalmic emulsion     . ergocalciferol , vitamin D2, 1,250 mcg (50,000 unit) capsule Take 50,000 Units by mouth once a week    . escitalopram  oxalate (LEXAPRO ) 10 MG tablet Take 10 mg by mouth once daily    . FUROsemide  (LASIX ) 20 MG tablet Take 10 mg by mouth once daily    . lisinopriL  (ZESTRIL ) 40 MG tablet Take 40 mg by mouth once daily    . naphazoline-pheniramine (OPCON-A ) 0.02675-0.315 % Drop Apply 1 drop to eye    .  OZEMPIC  0.25 mg or 0.5 mg (2 mg/3 mL) pen injector INJECT 0.5MG  INTO THE SKIN EVERY TUESDAY    . rimegepant (NURTEC ODT ) 75 mg disintegrating tablet Take 75 mg by mouth    . SYMBICORT  160-4.5 mcg/actuation inhaler Inhale 2 inhalations into the lungs    . traMADoL  (ULTRAM ) 50 mg tablet Take 50 mg by mouth 3 (three) times daily     No current facility-administered medications on file prior to visit.    Family History  Problem Relation Age of Onset  . Colon cancer Father   . Diabetes Sister   . Diabetes Maternal Grandmother   . Diabetes Maternal Grandfather      Social History   Tobacco Use  Smoking Status Former  . Types: Cigarettes  Smokeless Tobacco Never     Social History    Socioeconomic History  . Marital status: Widowed  Tobacco Use  . Smoking status: Former    Types: Cigarettes  . Smokeless tobacco: Never  Vaping Use  . Vaping status: Never Used  Substance and Sexual Activity  . Alcohol use: Never  . Drug use: Never   Social Drivers of Corporate investment banker Strain: Low Risk  (11/28/2022)   Received from [facility]   Overall Financial Resource Strain (CARDIA)   . Difficulty of Paying Living Expenses: Not hard at all  Food Insecurity: No Food Insecurity (05/15/2023)   Received from [facility]   Hunger Vital Sign   . Within the past 12 months, you worried that your food would run out before you got the money to buy more.: Never true   . Within the past 12 months, the food you bought just didn't last and you didn't have money to get more.: Never true  Transportation Needs: Unmet Transportation Needs (05/15/2023)   Received from [facility] - Transportation   . Lack of Transportation (Medical): Yes   . Lack of Transportation (Non-Medical): Yes  Physical Activity: Inactive (11/28/2022)   Received from [facility]   Exercise Vital Sign   . On average, how many days per week do you engage in moderate to strenuous exercise (like a brisk walk)?: 0 days   . On average, how many minutes do you engage in exercise at this level?: 0 min  Stress: Stress Concern Present (11/28/2022)   Received from [facility] - Occupational Stress Questionnaire   . Feeling of Stress : To some extent  Social Connections: Moderately Isolated (11/28/2022)   Received from [facility]   Social Connection and Isolation Panel   . In a typical week, how many times do you talk on the phone with family, friends, or neighbors?: Never   . How often do you get together with friends or relatives?: More than three times a week   . How often do you attend church or religious services?: More than 4 times per year   . Do you belong  to any clubs or organizations such as church groups, unions, fraternal or athletic groups, or school groups?: No   . How often do you attend meetings of the clubs or organizations you belong to?: Never   . Are you married, widowed, divorced, separated, never married, or living with a partner?: Widowed    Objective:   Vitals:   11/26/23 0937 11/26/23 0938  BP: 133/80   Pulse: 88   Temp: 36.7 C (98 F)   SpO2: 93%   Weight: ROLLEN)  107.8 kg (237 lb 9.6 oz)   Height: 149.9 cm (4' 11)   PainSc:  0-No pain    Body mass index is 47.99 kg/m.  Physical Exam   She is morbidly obese on exam.  She walks with a walker.  There is a 1 cm mass along the medial inframammary ridge of the right breast.  The overlying skin has the appearance of a sebaceous cyst.  There is no current infection.  Labs, Imaging and Diagnostic Testing: I have reviewed her notes in the electronic medical records and her recent ultrasound  Assessment and Plan:     Diagnoses and all orders for this visit:  Sebaceous cyst of skin of right breast    I suspect this is a sebaceous cyst based on the ultrasound and physical examination.  I explained the diagnosis to her in detail.  As it is almost on the breast at the inframammary ridge and there is causing discomfort especially with the breast laying over the top of it, I believe surgical excision is necessary for complete histologic evaluation to rule out a malignancy as well as to prevent the potential for infection and her ongoing discomfort.  We discussed the procedure in detail.  We discussed the risks which include but is not limited to bleeding, infection, cyst recurrence, the need for further procedures if malignancy is found, excetra.  Again, she understands and wishes to proceed with surgery  Moderate medical decision making    DOUGLAS DASIE POLI, MD

## 2023-11-30 ENCOUNTER — Encounter (HOSPITAL_BASED_OUTPATIENT_CLINIC_OR_DEPARTMENT_OTHER): Payer: Self-pay | Admitting: Surgery

## 2023-11-30 ENCOUNTER — Ambulatory Visit (INDEPENDENT_AMBULATORY_CARE_PROVIDER_SITE_OTHER): Payer: 59

## 2023-11-30 ENCOUNTER — Other Ambulatory Visit: Payer: Self-pay

## 2023-11-30 DIAGNOSIS — Z0000 Encounter for general adult medical examination without abnormal findings: Secondary | ICD-10-CM

## 2023-11-30 NOTE — Progress Notes (Signed)
 Spoke with Sari regarding aspirin  hold time for surgery scheduled 12/07/2023. States she will find out and inform patient.

## 2023-11-30 NOTE — Patient Instructions (Signed)
 Sandra Brennan,  Thank you for taking the time for your Medicare Wellness Visit. I appreciate your continued commitment to your health goals. Please review the care plan we discussed, and feel free to reach out if I can assist you further.  Medicare recommends these wellness visits once per year to help you and your care team stay ahead of potential health issues. These visits are designed to focus on prevention, allowing your provider to concentrate on managing your acute and chronic conditions during your regular appointments.  Please note that Annual Wellness Visits do not include a physical exam. Some assessments may be limited, especially if the visit was conducted virtually. If needed, we may recommend a separate in-person follow-up with your provider.  Ongoing Care Seeing your primary care provider every 3 to 6 months helps us  monitor your health and provide consistent, personalized care.   Referrals If a referral was made during today's visit and you haven't received any updates within two weeks, please contact the referred provider directly to check on the status.  Recommended Screenings:  Health Maintenance  Topic Date Due   Eye exam for diabetics  10/01/2022   COVID-19 Vaccine (6 - 2025-26 season) 11/02/2023   Zoster (Shingles) Vaccine (1 of 2) 01/15/2024*   Hemoglobin A1C  01/15/2024   Breast Cancer Screening  04/26/2024   Yearly kidney function blood test for diabetes  07/14/2024   Yearly kidney health urinalysis for diabetes  07/14/2024   Complete foot exam   10/14/2024   Medicare Annual Wellness Visit  11/29/2024   Pneumococcal Vaccine for age over 86  Completed   DEXA scan (bone density measurement)  Completed   Hepatitis C Screening  Completed   HPV Vaccine  Aged Out   Meningitis B Vaccine  Aged Out   DTaP/Tdap/Td vaccine  Discontinued   Colon Cancer Screening  Discontinued   Cologuard (Stool DNA test)  Discontinued  *Topic was postponed. The date shown is not the  original due date.       11/30/2023   10:18 AM  Advanced Directives  Does Patient Have a Medical Advance Directive? Yes  Type of Estate agent of [place];Living will  Copy of Healthcare Power of Attorney in Chart? No - copy requested   Advance Care Planning is important because it: Ensures you receive medical care that aligns with your values, goals, and preferences. Provides guidance to your family and loved ones, reducing the emotional burden of decision-making during critical moments.  Vision: Annual vision screenings are recommended for early detection of glaucoma, cataracts, and diabetic retinopathy. These exams can also reveal signs of chronic conditions such as diabetes and high blood pressure.  Dental: Annual dental screenings help detect early signs of oral cancer, gum disease, and other conditions linked to overall health, including heart disease and diabetes.  Please see the attached documents for additional preventive care recommendations.

## 2023-11-30 NOTE — Progress Notes (Signed)
 Subjective:   Sandra Brennan is a 78 y.o. who presents for a Medicare Wellness preventive visit.  As a reminder, Annual Wellness Visits don't include a physical exam, and some assessments may be limited, especially if this visit is performed virtually. We may recommend an in-person follow-up visit with your provider if needed.  Visit Complete: Virtual I connected with  Sandra Brennan on 11/30/23 by a audio enabled telemedicine application and verified that I am speaking with the correct person using two identifiers.  Patient Location: Home  Provider Location: Office/Clinic  I discussed the limitations of evaluation and management by telemedicine. The patient expressed understanding and agreed to proceed.  Vital Signs: Because this visit was a virtual/telehealth visit, some criteria may be missing or patient reported. Any vitals not documented were not able to be obtained and vitals that have been documented are patient reported.  VideoError- Librarian, academic were attempted between this provider and patient, however failed, due to patient having technical difficulties OR patient did not have access to video capability.  We continued and completed visit with audio only.   Persons Participating in Visit: Patient.  AWV Questionnaire: No: Patient Medicare AWV questionnaire was not completed prior to this visit.  Cardiac Risk Factors include: advanced age (>46men, >67 women);diabetes mellitus;dyslipidemia;hypertension     Objective:    Today's Vitals   11/30/23 1005  PainSc: 9    There is no height or weight on file to calculate BMI.     11/30/2023   10:18 AM 11/28/2022   10:17 AM 08/10/2022   10:53 AM 07/06/2022    2:19 PM 11/26/2021    1:52 PM 10/15/2021   10:24 AM 08/29/2021    9:27 AM  Advanced Directives  Does Patient Have a Medical Advance Directive? Yes No No No Yes Yes   Type of Estate agent of [place];Living will     Healthcare Power of [place];Living will Healthcare Power of [place];Living will   Copy of Healthcare Power of Attorney in Chart? No - copy requested    No - copy requested    Would patient like information on creating a medical advance directive?    No - Patient declined   Yes (ED - Information included in AVS)    Current Medications (verified) Outpatient Encounter Medications as of 11/30/2023  Medication Sig   acetaminophen  (TYLENOL  8 HOUR ARTHRITIS PAIN) 650 MG CR tablet Take 1,300 mg by mouth every 8 (eight) hours as needed (for headaches).   albuterol  (VENTOLIN  HFA) 108 (90 Base) MCG/ACT inhaler INHALE 1 PUFF INTO THE LUNGS EVERY 6 (SIX) HOURS AS NEEDED FOR SHORTNESS OF BREATH OR WHEEZING.   allopurinol  (ZYLOPRIM ) 100 MG tablet Take 1 tablet (100 mg total) by mouth daily.   amLODipine  (NORVASC ) 10 MG tablet TAKE 1 TABLET BY MOUTH EVERYDAY AT BEDTIME   aspirin  EC 81 MG tablet Take 81 mg by mouth daily.   atorvastatin  (LIPITOR) 20 MG tablet Take 1 tablet (20 mg total) by mouth every evening.   cetirizine  (ZYRTEC ) 10 MG tablet Take 10 mg by mouth in the morning.   cholecalciferol  (VITAMIN D3) 25 MCG (1000 UNIT) tablet Take 1,000 Units by mouth daily.   escitalopram  (LEXAPRO ) 10 MG tablet TAKE 1 TABLET BY MOUTH EVERY DAY   furosemide  (LASIX ) 20 MG tablet TAKE 1/2 TABLET BY MOUTH DAILY   lisinopril  (ZESTRIL ) 40 MG tablet TAKE 1 TABLET BY MOUTH EVERY DAY   Multiple Vitamin (MULTIVITAMIN) tablet Take 1 tablet  by mouth daily with breakfast.   nystatin  (MYCOSTATIN /NYSTOP ) powder APPLY TOPICALLY 3 TIMES A DAY   omeprazole  (PRILOSEC) 20 MG capsule TAKE 1 CAPSULE BY MOUTH DAILY BEFORE BREAKFAST.   OPCON-A  0.027-0.315 % SOLN Place 1 drop into both eyes 3 (three) times daily as needed (for itching).   polycarbophil (FIBERCON) 625 MG tablet Take 625 mg by mouth daily.   Polyethylene Glycol 3350  (MIRALAX  PO) Take 17 g by mouth as needed.   Rimegepant Sulfate  (NURTEC) 75 MG TBDP Take 1 tablet (75 mg  total) by mouth as needed (take 1 at onset of headache, max is 75 in 24 hours).   Semaglutide ,0.25 or 0.5MG /DOS, (OZEMPIC , 0.25 OR 0.5 MG/DOSE,) 2 MG/3ML SOPN INJECT 0.5MG  INTO THE SKIN EVERY TUESDAY   SYMBICORT  160-4.5 MCG/ACT inhaler Inhale 2 puffs into the lungs in the morning and at bedtime. Rinse mouth after each use   traMADol  (ULTRAM ) 50 MG tablet Take 1 tablet (50 mg total) by mouth 3 (three) times daily.   No facility-administered encounter medications on file as of 11/30/2023.    Allergies (verified) Cymbalta  [duloxetine  hcl], Pineapple, Sulfa antibiotics, Influenza vaccines, Jardiance  [empagliflozin ], Other, Penicillins, and Theophyllines   History: Past Medical History:  Diagnosis Date   Acute nontraumatic kidney injury 05/05/2013   Acute posthemorrhagic anemia 09/06/2012   Anemia    Arthritis    plenty (Aug 29, 2012)   Asthma    Chest pain 03/30/2016   Chronic headaches    Chronic lower back pain    they say I need a whole new spinal column (2012-08-29)   COPD (chronic obstructive pulmonary disease) (HCC)    Degenerative arthritis    all over (2012/08/29)   Fx ankle    GERD (gastroesophageal reflux disease)    Glaucoma    Gout 05/05/2013   Hypertension    Migraines    used to; totally stopped when I quit drinking 30 yr ago (29-Aug-2012)   Myalgia and myositis    Myocardial infarction [facility]) 1992   08/29/12 mild MI when son died    Obesity    Osteoporosis    all over (08-29-2012)   Shortness of breath    when stressed.   Sleep apnea    never did fix my machine; haven't used one for 5 years; I've lost 116# since then; no problems now (08/29/12)   Syncope 03/30/2016   Type II diabetes mellitus (HCC)    borderline; don't test; take Metformin  (08-29-12)   Past Surgical History:  Procedure Laterality Date   ABDOMINAL HYSTERECTOMY  1990's   GANGLION CYST EXCISION Right 1980's   wrist (2012-08-29)   JOINT REPLACEMENT     KNEE LIGAMENT RECONSTRUCTION Right  1980's   TONSILLECTOMY  ~ 1954   TOTAL HIP ARTHROPLASTY Left 2012-08-29   TOTAL HIP ARTHROPLASTY Left Aug 29, 2012   Procedure: TOTAL HIP ARTHROPLASTY;  Surgeon: Maude LELON Right, MD;  Location: [facility] OR;  Service: Orthopedics;  Laterality: Left;  Left Total Hip Arthroplasty   TOTAL HIP ARTHROPLASTY Left 08/28/2012   Procedure: Irrigation and Debridement hip ;  Surgeon: Lonni CINDERELLA Poli, MD;  Location: [facility] OR;  Service: Orthopedics;  Laterality: Left;   TOTAL KNEE ARTHROPLASTY Left 2001   TOTAL KNEE ARTHROPLASTY Right 2004   Family History  Problem Relation Age of Onset   Arthritis Mother    Arthritis Father    Colon cancer Father    Diabetes Sister    Arthritis Sister    Diabetes Maternal Grandmother    Arthritis Maternal Grandmother  Diabetes Maternal Grandfather    Arthritis Maternal Grandfather    Migraines Neg Hx    Sleep apnea Neg Hx    Social History   Socioeconomic History   Marital status: Widowed    Spouse name: Not on file   Number of children: 5   Years of education: PhD   Highest education level: Not on file  Occupational History   Occupation: Retired  Tobacco Use   Smoking status: Former    Current packs/day: 1.00    Types: Cigarettes   Smokeless tobacco: Never   Tobacco comments:    08/03/2012 quit 30 years ago  Vaping Use   Vaping status: Never Used  Substance and Sexual Activity   Alcohol use: No    Alcohol/week: 0.0 standard drinks of alcohol    Comment: 08/03/2012 used to be a beeralcoholic; stopped ~ 30 yr ago   Drug use: No   Sexual activity: Never  Other Topics Concern   Not on file  Social History Narrative   Lives at home with her mother and caregiver.- mother past away 01/2021 and now lives alone.   Occasional use of caffeine.   Right-handed.   Social Drivers of Corporate investment banker Strain: Low Risk  (11/30/2023)   Overall Financial Resource Strain (CARDIA)    Difficulty of Paying Living Expenses: Not hard at all  Food  Insecurity: No Food Insecurity (11/30/2023)   Hunger Vital Sign    Worried About Running Out of Food in the Last Year: Never true    Ran Out of Food in the Last Year: Never true  Transportation Needs: No Transportation Needs (11/30/2023)   PRAPARE - Administrator, Civil Service (Medical): No    Lack of Transportation (Non-Medical): No  Physical Activity: Inactive (11/30/2023)   Exercise Vital Sign    Days of Exercise per Week: 0 days    Minutes of Exercise per Session: 0 min  Stress: No Stress Concern Present (11/30/2023)   [employer] of Occupational Health - Occupational Stress Questionnaire    Feeling of Stress: Not at all  Social Connections: Moderately Integrated (11/30/2023)   Social Connection and Isolation Panel    Frequency of Communication with Friends and Family: Three times a week    Frequency of Social Gatherings with Friends and Family: More than three times a week    Attends Religious Services: More than 4 times per year    Active Member of Clubs or Organizations: Yes    Attends Banker Meetings: More than 4 times per year    Marital Status: Widowed    Tobacco Counseling Counseling given: Not Answered Tobacco comments: 08/03/2012 quit 30 years ago    Clinical Intake:  Pre-visit preparation completed: Yes  Pain : 0-10 Pain Score: 9  Pain Type: Chronic pain Pain Location: Arm Pain Orientation: Left, Right Pain Descriptors / Indicators: Aching Pain Onset: More than a month ago Pain Frequency: Constant     Nutritional Risks: None Diabetes: Yes CBG done?: No Did pt. bring in CBG monitor from home?: No  Lab Results  Component Value Date   HGBA1C 5.5 07/15/2023   HGBA1C 5.7 01/15/2023   HGBA1C 5.6 06/04/2022     How often do you need to have someone help you when you read instructions, pamphlets, or other written materials from your doctor or pharmacy?: 1 - Never  Interpreter Needed?: No  Information entered by :: NAllen  LPN   Activities of Daily Living  11/30/2023   10:07 AM  In your present state of health, do you have any difficulty performing the following activities:  Hearing? 0  Vision? 0  Difficulty concentrating or making decisions? 1  Comment memory sometimes  Walking or climbing stairs? 1  Dressing or bathing? 0  Doing errands, shopping? 0  Preparing Food and eating ? N  Using the Toilet? N  In the past six months, have you accidently leaked urine? N  Do you have problems with loss of bowel control? N  Managing your Medications? N  Managing your Finances? N  Housekeeping or managing your Housekeeping? N    Patient Care Team: Nche, Roselie Rockford, NP as PCP - General (Internal Medicine) Anderson Maude ORN, MD (Inactive) as Consulting Physician (Orthopedic Surgery)  I have updated your Care Teams any recent Medical Services you may have received from other providers in the past year.     Assessment:   This is a routine wellness examination for Sandra Brennan.  Hearing/Vision screen Hearing Screening - Comments:: Denies hearing issues Vision Screening - Comments:: Regular eye exams, [facility]   Goals Addressed             This Visit's Progress    Patient Stated       11/30/2023, be more active       Depression Screen     11/30/2023   10:21 AM 10/15/2023   10:33 AM 07/08/2023    8:07 AM 01/15/2023   10:11 AM 01/15/2023    9:28 AM 01/05/2023    8:19 AM 11/28/2022   10:20 AM  PHQ 2/9 Scores  PHQ - 2 Score 0 1 2 0 0 2 0  PHQ- 9 Score 3 7  3        Fall Risk     11/30/2023   10:18 AM 10/15/2023   10:32 AM 07/08/2023    8:07 AM 01/15/2023    9:27 AM 01/05/2023    8:18 AM  Fall Risk   Falls in the past year? 1 1 1  0 1  Comment not sure what happened, fell off a scooter      Number falls in past yr: 1 1 1  0 1  Comment   4 in last 6 months and last fall was in April 2025  no new fall  Injury with Fall? 0 0 1 0 1  Risk for fall due to : Impaired mobility;Impaired  balance/gait;History of fall(s);Medication side effect History of fall(s) Impaired balance/gait;History of fall(s) Impaired balance/gait History of fall(s);Impaired balance/gait  Risk for fall due to: Comment    patient uses a walker   Follow up Falls prevention discussed;Falls evaluation completed Falls evaluation completed       MEDICARE RISK AT HOME:  Medicare Risk at Home Any stairs in or around the home?: No If so, are there any without handrails?: No Home free of loose throw rugs in walkways, pet beds, electrical cords, etc?: Yes Adequate lighting in your home to reduce risk of falls?: Yes Life alert?: No Use of a cane, walker or w/c?: Yes Grab bars in the bathroom?: Yes Shower chair or bench in shower?: Yes Elevated toilet seat or a handicapped toilet?: Yes  TIMED UP AND GO:  Was the test performed?  No  Cognitive Function: 6CIT completed    04/01/2017   10:40 AM  MMSE - Mini Mental State Exam  Orientation to time 5   Orientation to Place 5   Registration 3   Attention/ Calculation 5  Recall 1   Language- name 2 objects 2   Language- repeat 1  Language- follow 3 step command 3   Language- read & follow direction 1   Write a sentence 1   Copy design 1   Total score 28      Data saved with a previous flowsheet row definition        11/30/2023   10:24 AM 11/28/2022   10:20 AM 11/26/2021    1:53 PM  6CIT Screen  What Year? 0 points 0 points 0 points  What month? 0 points 0 points 0 points  What time? 0 points 0 points 0 points  Count back from 20 0 points 0 points 0 points  Months in reverse 0 points 2 points 0 points  Repeat phrase 2 points 6 points 2 points  Total Score 2 points 8 points 2 points    Immunizations Immunization History  Administered Date(s) Administered   Moderna Sars-Covid-2 Vaccination 05/09/2019, 05/12/2019, 06/10/2019, 04/20/2020, 09/05/2020   Pneumococcal Conjugate-13 06/02/2016   Pneumococcal Polysaccharide-23 08/31/2017     Screening Tests Health Maintenance  Topic Date Due   OPHTHALMOLOGY EXAM  10/01/2022   COVID-19 Vaccine (6 - 2025-26 season) 11/02/2023   Zoster Vaccines- Shingrix (1 of 2) 01/15/2024 (Originally 06/07/1964)   HEMOGLOBIN A1C  01/15/2024   Mammogram  04/26/2024   Diabetic kidney evaluation - eGFR measurement  07/14/2024   Diabetic kidney evaluation - Urine ACR  07/14/2024   FOOT EXAM  10/14/2024   Medicare Annual Wellness (AWV)  11/29/2024   Pneumococcal Vaccine: 50+ Years  Completed   DEXA SCAN  Completed   Hepatitis C Screening  Completed   HPV VACCINES  Aged Out   Meningococcal B Vaccine  Aged Out   DTaP/Tdap/Td  Discontinued   Colonoscopy  Discontinued   Fecal DNA (Cologuard)  Discontinued    Health Maintenance Items Addressed: Due for covid vaccine.  Additional Screening:  Vision Screening: Recommended annual ophthalmology exams for early detection of glaucoma and other disorders of the eye. Is the patient up to date with their annual eye exam?  Yes  Who is the provider or what is the name of the office in which the patient attends annual eye exams? [facility]  Dental Screening: Recommended annual dental exams for proper oral hygiene  Community Resource Referral / Chronic Care Management: CRR required this visit?  No   CCM required this visit?  No   Plan:    I have personally reviewed and noted the following in the patient's chart:   Medical and social history Use of alcohol, tobacco or illicit drugs  Current medications and supplements including opioid prescriptions. Patient is not currently taking opioid prescriptions. Functional ability and status Nutritional status Physical activity Advanced directives List of other physicians Hospitalizations, surgeries, and ER visits in previous 12 months Vitals Screenings to include cognitive, depression, and falls Referrals and appointments  In addition, I have reviewed and discussed with patient certain preventive  protocols, quality metrics, and best practice recommendations. A written personalized care plan for preventive services as well as general preventive health recommendations were provided to patient.   Sandra Brennan FORBES Dawn, LPN   [id number]   After Visit Summary: (Pick Up) Due to this being a telephonic visit, with patients personalized plan was offered to patient and patient has requested to Pick up at office.  Notes: Nothing significant to report at this time.

## 2023-12-02 ENCOUNTER — Encounter (HOSPITAL_BASED_OUTPATIENT_CLINIC_OR_DEPARTMENT_OTHER)
Admission: RE | Admit: 2023-12-02 | Discharge: 2023-12-02 | Disposition: A | Discharge status: Home / Self Care | Source: Ambulatory Visit | Attending: Surgery | Admitting: Surgery

## 2023-12-02 DIAGNOSIS — Z01818 Encounter for other preprocedural examination: Secondary | ICD-10-CM | POA: Insufficient documentation

## 2023-12-02 LAB — BASIC METABOLIC PANEL WITH GFR
Anion gap: 10 (ref 5–15)
BUN: 10 mg/dL (ref 8–23)
CO2: 24 mmol/L (ref 22–32)
Calcium: 8.4 mg/dL — ABNORMAL LOW (ref 8.9–10.3)
Chloride: 103 mmol/L (ref 98–111)
Creatinine, Ser: 0.97 mg/dL (ref 0.44–1.00)
GFR, Estimated: >60 mL/min (ref >60)
Glucose, Bld: 79 mg/dL (ref 70–99)
Potassium: 3.8 mmol/L (ref 3.5–5.1)
Sodium: 137 mmol/L (ref 135–145)

## 2023-12-02 MED ORDER — CHLORHEXIDINE GLUCONATE CLOTH 2 % EX PADS: 6.0000 | MEDICATED_PAD | Freq: Once | CUTANEOUS | Status: DC

## 2023-12-02 MED ORDER — ENSURE PRE-SURGERY PO LIQD: 296.0000 mL | Freq: Once | ORAL | Status: DC

## 2023-12-02 NOTE — Progress Notes (Signed)

## 2023-12-06 NOTE — H&P (Signed)
 REFERRING PHYSICIAN: Nche, Roselie Rockford, NP PROVIDER: VICENTA DASIE POLI, MD MRN: [id number] DOB: March 07, 1945 DATE OF ENCOUNTER: 11/26/2023 Subjective   Chief Complaint: New Consultation  History of Present Illness: Sandra Brennan is a 78 y.o. female who is seen today as an office consultation for evaluation of New Consultation  This is a 78 year old female was referred to me for a painful nodule underneath her right breast. She is uncertain how long it has been there but she reports it is now getting bigger and is causing discomfort especially with the breast laying on top of it. She underwent an ultrasound showing a small 1 cm mass in this area. She has had no previous problem regarding her breast. There is a strong family history of breast cancer which she did not specify  Review of Systems: A complete review of systems was obtained from the patient. I have reviewed this information and discussed as appropriate with the patient. See HPI as well for other ROS.  ROS   Medical History: Past Medical History:  Diagnosis Date  Anemia  Arthritis  Asthma, unspecified asthma severity, unspecified whether complicated, unspecified whether persistent (HHS-HCC)  COPD (chronic obstructive pulmonary disease) (CMS/HHS-HCC)  Diabetes mellitus without complication (CMS/HHS-HCC)  GERD (gastroesophageal reflux disease)  Hypertension  Sleep apnea   There is no problem list on file for this patient.  Past Surgical History:  Procedure Laterality Date  HYSTERECTOMY  JOINT REPLACEMENT    Allergies  Allergen Reactions  Duloxetine  Anaphylaxis  Pineapple Shortness Of Breath  Empagliflozin  Itching  Sore throat  Influenza Virus Vaccines Hives, Itching and Unknown  Penicillins Hives and Unknown  Sulfacetamide Sodium Other (See Comments)  REACTION: unknown   Current Outpatient Medications on File Prior to Visit  Medication Sig Dispense Refill  acetaminophen  (TYLENOL ) 650 MG ER tablet Take  1,300 mg by mouth  albuterol  MDI, PROVENTIL , VENTOLIN , PROAIR , HFA (PROAIR  HFA) 90 mcg/actuation inhaler Inhale 2 inhalations into the lungs 2 (two) times daily  allopurinoL  (ZYLOPRIM ) 100 MG tablet Take 100 mg by mouth once daily  amLODIPine  (NORVASC ) 10 MG tablet Take 10 mg by mouth at bedtime  aspirin  81 MG EC tablet Take 81 mg by mouth once daily  atorvastatin  (LIPITOR) 20 MG tablet Take 20 mg by mouth every morning  calcium  polycarbophiL (FIBERCON) 625 mg tablet Take 625 mg by mouth once daily  cetirizine  (ZYRTEC ) 10 MG tablet Take 10 mg by mouth once daily  cholecalciferol  (VITAMIN D3) 1000 unit tablet Take 1,000 Units by mouth once daily  cycloSPORINE  (RESTASIS ) 0.05 % ophthalmic emulsion  ergocalciferol , vitamin D2, 1,250 mcg (50,000 unit) capsule Take 50,000 Units by mouth once a week  escitalopram  oxalate (LEXAPRO ) 10 MG tablet Take 10 mg by mouth once daily  FUROsemide  (LASIX ) 20 MG tablet Take 10 mg by mouth once daily  lisinopriL  (ZESTRIL ) 40 MG tablet Take 40 mg by mouth once daily  naphazoline-pheniramine (OPCON-A ) 0.02675-0.315 % Drop Apply 1 drop to eye  OZEMPIC  0.25 mg or 0.5 mg (2 mg/3 mL) pen injector INJECT 0.5MG  INTO THE SKIN EVERY TUESDAY  rimegepant (NURTEC ODT ) 75 mg disintegrating tablet Take 75 mg by mouth  SYMBICORT  160-4.5 mcg/actuation inhaler Inhale 2 inhalations into the lungs  traMADoL  (ULTRAM ) 50 mg tablet Take 50 mg by mouth 3 (three) times daily   No current facility-administered medications on file prior to visit.   Family History  Problem Relation Age of Onset  Colon cancer Father  Diabetes Sister  Diabetes Maternal Grandmother  Diabetes Maternal Grandfather  Social History   Tobacco Use  Smoking Status Former  Types: Cigarettes  Smokeless Tobacco Never    Social History   Socioeconomic History  Marital status: Widowed  Tobacco Use  Smoking status: Former  Types: Cigarettes  Smokeless tobacco: Never  Vaping Use  Vaping status:  Never Used  Substance and Sexual Activity  Alcohol use: Never  Drug use: Never   Social Drivers of Corporate investment banker Strain: Low Risk (11/28/2022)  Received from [facility]  Overall Financial Resource Strain (CARDIA)  Difficulty of Paying Living Expenses: Not hard at all  Food Insecurity: No Food Insecurity (05/15/2023)  [facility]  Hunger Vital Sign  Within the past 12 months, you worried that your food would run out before you got the money to buy more.: Never true  Within the past 12 months, the food you bought just didn't last and you didn't have money to get more.: Never true  Transportation Needs: Unmet Transportation Needs (05/15/2023)  Received from [facility] - Transportation  Lack of Transportation (Medical): Yes  Lack of Transportation (Non-Medical): Yes  Physical Activity: Inactive (11/28/2022)  Received from [facility]  Exercise Vital Sign  On average, how many days per week do you engage in moderate to strenuous exercise (like a brisk walk)?: 0 days  On average, how many minutes do you engage in exercise at this level?: 0 min  Stress: Stress Concern Present (11/28/2022)  Received from [facility] of Occupational Health - Occupational Stress Questionnaire  Feeling of Stress : To some extent  Social Connections: Moderately Isolated (11/28/2022)  Received from [facility]  Social Connection and Isolation Panel  In a typical week, how many times do you talk on the phone with family, friends, or neighbors?: Never  How often do you get together with friends or relatives?: More than three times a week  How often do you attend church or religious services?: More than 4 times per year  Do you belong to any clubs or organizations such as church groups, unions, fraternal or athletic groups, or school groups?: No  How often do you attend meetings of the clubs or organizations you belong to?: Never  Are you married, widowed,  divorced, separated, never married, or living with a partner?: Widowed   Objective:   Vitals:  11/26/23 0937 11/26/23 0938  BP: 133/80  Pulse: 88  Temp: 36.7 C (98 F)  SpO2: 93%  Weight: (!) 107.8 kg (237 lb 9.6 oz)  Height: 149.9 cm (4' 11)  PainSc: 0-No pain   Body mass index is 47.99 kg/m.  Physical Exam   She is morbidly obese on exam. She walks with a walker.  There is a 1 cm mass along the medial inframammary ridge of the right breast. The overlying skin has the appearance of a sebaceous cyst. There is no current infection.  Labs, Imaging and Diagnostic Testing: I have reviewed her notes in the electronic medical records and her recent ultrasound  Assessment and Plan:   Diagnoses and all orders for this visit:  Sebaceous cyst of skin of right breast   I suspect this is a sebaceous cyst based on the ultrasound and physical examination. I explained the diagnosis to her in detail. As it is almost on the breast at the inframammary ridge and there is causing discomfort especially with the breast laying over the top of it, I believe surgical excision is necessary for complete histologic evaluation to rule  out a malignancy as well as to prevent the potential for infection and her ongoing discomfort. We discussed the procedure in detail. We discussed the risks which include but is not limited to bleeding, infection, cyst recurrence, the need for further procedures if malignancy is found, excetra. Again, she understands and wishes to proceed with surgery

## 2023-12-07 ENCOUNTER — Ambulatory Visit (HOSPITAL_BASED_OUTPATIENT_CLINIC_OR_DEPARTMENT_OTHER): Admitting: Anesthesiology

## 2023-12-07 ENCOUNTER — Encounter (HOSPITAL_BASED_OUTPATIENT_CLINIC_OR_DEPARTMENT_OTHER)
Admission: RE | Disposition: A | Discharge status: Home / Self Care | Payer: Self-pay | Source: Home / Self Care | Attending: Surgery

## 2023-12-07 ENCOUNTER — Ambulatory Visit (HOSPITAL_BASED_OUTPATIENT_CLINIC_OR_DEPARTMENT_OTHER)
Admission: RE | Admit: 2023-12-07 | Discharge: 2023-12-07 | Disposition: A | Discharge status: Home / Self Care | Attending: Surgery | Admitting: Surgery

## 2023-12-07 ENCOUNTER — Other Ambulatory Visit: Payer: Self-pay

## 2023-12-07 ENCOUNTER — Encounter (HOSPITAL_BASED_OUTPATIENT_CLINIC_OR_DEPARTMENT_OTHER): Payer: Self-pay | Admitting: Surgery

## 2023-12-07 DIAGNOSIS — I252 Old myocardial infarction: Secondary | ICD-10-CM | POA: Insufficient documentation

## 2023-12-07 DIAGNOSIS — G4730 Sleep apnea, unspecified: Secondary | ICD-10-CM | POA: Diagnosis not present

## 2023-12-07 DIAGNOSIS — I129 Hypertensive chronic kidney disease with stage 1 through stage 4 chronic kidney disease, or unspecified chronic kidney disease: Secondary | ICD-10-CM | POA: Diagnosis not present

## 2023-12-07 DIAGNOSIS — N6081 Other benign mammary dysplasias of right breast: Secondary | ICD-10-CM | POA: Diagnosis not present

## 2023-12-07 DIAGNOSIS — K219 Gastro-esophageal reflux disease without esophagitis: Secondary | ICD-10-CM | POA: Diagnosis not present

## 2023-12-07 DIAGNOSIS — E1122 Type 2 diabetes mellitus with diabetic chronic kidney disease: Secondary | ICD-10-CM | POA: Diagnosis not present

## 2023-12-07 DIAGNOSIS — E119 Type 2 diabetes mellitus without complications: Secondary | ICD-10-CM | POA: Insufficient documentation

## 2023-12-07 DIAGNOSIS — Z79899 Other long term (current) drug therapy: Secondary | ICD-10-CM | POA: Diagnosis not present

## 2023-12-07 DIAGNOSIS — Z6841 Body Mass Index (BMI) 40.0 and over, adult: Secondary | ICD-10-CM | POA: Diagnosis not present

## 2023-12-07 DIAGNOSIS — E66813 Obesity, class 3: Secondary | ICD-10-CM | POA: Diagnosis not present

## 2023-12-07 DIAGNOSIS — E1142 Type 2 diabetes mellitus with diabetic polyneuropathy: Secondary | ICD-10-CM

## 2023-12-07 DIAGNOSIS — M810 Age-related osteoporosis without current pathological fracture: Secondary | ICD-10-CM | POA: Insufficient documentation

## 2023-12-07 DIAGNOSIS — Z7951 Long term (current) use of inhaled steroids: Secondary | ICD-10-CM | POA: Diagnosis not present

## 2023-12-07 DIAGNOSIS — F32A Depression, unspecified: Secondary | ICD-10-CM | POA: Diagnosis not present

## 2023-12-07 DIAGNOSIS — M797 Fibromyalgia: Secondary | ICD-10-CM | POA: Diagnosis not present

## 2023-12-07 DIAGNOSIS — N183 Chronic kidney disease, stage 3 unspecified: Secondary | ICD-10-CM

## 2023-12-07 DIAGNOSIS — Z7982 Long term (current) use of aspirin: Secondary | ICD-10-CM | POA: Diagnosis not present

## 2023-12-07 DIAGNOSIS — M5416 Radiculopathy, lumbar region: Secondary | ICD-10-CM | POA: Diagnosis not present

## 2023-12-07 DIAGNOSIS — J449 Chronic obstructive pulmonary disease, unspecified: Secondary | ICD-10-CM | POA: Diagnosis not present

## 2023-12-07 DIAGNOSIS — M1990 Unspecified osteoarthritis, unspecified site: Secondary | ICD-10-CM | POA: Diagnosis not present

## 2023-12-07 DIAGNOSIS — N644 Mastodynia: Secondary | ICD-10-CM | POA: Insufficient documentation

## 2023-12-07 DIAGNOSIS — L720 Epidermal cyst: Secondary | ICD-10-CM | POA: Insufficient documentation

## 2023-12-07 DIAGNOSIS — R519 Headache, unspecified: Secondary | ICD-10-CM | POA: Diagnosis not present

## 2023-12-07 DIAGNOSIS — M4802 Spinal stenosis, cervical region: Secondary | ICD-10-CM | POA: Diagnosis not present

## 2023-12-07 DIAGNOSIS — I10 Essential (primary) hypertension: Secondary | ICD-10-CM | POA: Insufficient documentation

## 2023-12-07 DIAGNOSIS — L723 Sebaceous cyst: Secondary | ICD-10-CM | POA: Diagnosis not present

## 2023-12-07 DIAGNOSIS — Z87891 Personal history of nicotine dependence: Secondary | ICD-10-CM | POA: Insufficient documentation

## 2023-12-07 HISTORY — PX: BREAST CYST EXCISION: SHX579

## 2023-12-07 LAB — GLUCOSE, CAPILLARY
Glucose-Capillary: 88 mg/dL (ref 70–99)
Glucose-Capillary: 106 mg/dL — ABNORMAL HIGH (ref 70–99)

## 2023-12-07 SURGERY — EXCISION, CYST, BREAST
Anesthesia: Monitor Anesthesia Care | Site: Breast | Laterality: Right

## 2023-12-07 MED ORDER — ACETAMINOPHEN 500 MG PO TABS
ORAL_TABLET | ORAL | Status: AC
  Filled 2023-12-07: qty 2

## 2023-12-07 MED ORDER — ACETAMINOPHEN 500 MG PO TABS
1000.0000 mg | ORAL_TABLET | ORAL | Status: AC
  Administered 2023-12-07: 1000 mg via ORAL

## 2023-12-07 MED ORDER — CIPROFLOXACIN IN D5W 400 MG/200ML IV SOLN
400.0000 mg | INTRAVENOUS | Status: AC
  Administered 2023-12-07: 400 mg via INTRAVENOUS

## 2023-12-07 MED ORDER — AMISULPRIDE (ANTIEMETIC) 5 MG/2ML IV SOLN: 10.0000 mg | Freq: Once | INTRAVENOUS | Status: DC | PRN

## 2023-12-07 MED ORDER — FENTANYL CITRATE (PF) 100 MCG/2ML IJ SOLN: 25.0000 ug | INTRAMUSCULAR | Status: DC | PRN

## 2023-12-07 MED ORDER — ONDANSETRON HCL 4 MG/2ML IJ SOLN
INTRAMUSCULAR | Status: DC | PRN
  Administered 2023-12-07: 4 mg via INTRAVENOUS

## 2023-12-07 MED ORDER — FENTANYL CITRATE (PF) 100 MCG/2ML IJ SOLN
INTRAMUSCULAR | Status: DC | PRN
  Administered 2023-12-07: 25 ug via INTRAVENOUS

## 2023-12-07 MED ORDER — LACTATED RINGERS IV SOLN: INTRAVENOUS | Status: DC

## 2023-12-07 MED ORDER — PROPOFOL 500 MG/50ML IV EMUL
INTRAVENOUS | Status: DC | PRN
  Administered 2023-12-07: 100 ug/kg/min via INTRAVENOUS

## 2023-12-07 MED ORDER — PHENYLEPHRINE HCL (PRESSORS) 10 MG/ML IV SOLN
INTRAVENOUS | Status: DC | PRN
Start: 2023-12-07 — End: 2023-12-07
  Administered 2023-12-07: 160 ug via INTRAVENOUS

## 2023-12-07 MED ORDER — OXYCODONE HCL 5 MG PO TABS
ORAL_TABLET | ORAL | Status: AC
  Filled 2023-12-07: qty 1

## 2023-12-07 MED ORDER — LIDOCAINE-EPINEPHRINE (PF) 1 %-1:200000 IJ SOLN
INTRAMUSCULAR | Status: AC
  Filled 2023-12-07: qty 30

## 2023-12-07 MED ORDER — PROPOFOL 10 MG/ML IV BOLUS
INTRAVENOUS | Status: DC | PRN
  Administered 2023-12-07: 30 mg via INTRAVENOUS

## 2023-12-07 MED ORDER — OXYCODONE HCL 5 MG/5ML PO SOLN: 5.0000 mg | Freq: Once | ORAL | Status: AC | PRN

## 2023-12-07 MED ORDER — FENTANYL CITRATE (PF) 100 MCG/2ML IJ SOLN
INTRAMUSCULAR | Status: AC
  Filled 2023-12-07: qty 2

## 2023-12-07 MED ORDER — BUPIVACAINE-EPINEPHRINE (PF) 0.5% -1:200000 IJ SOLN
INTRAMUSCULAR | Status: AC
  Filled 2023-12-07: qty 30

## 2023-12-07 MED ORDER — LIDOCAINE-EPINEPHRINE (PF) 1 %-1:200000 IJ SOLN
INTRAMUSCULAR | Status: DC | PRN
  Administered 2023-12-07: 10 mL

## 2023-12-07 MED ORDER — TRAMADOL HCL 50 MG PO TABS: 50.0000 mg | ORAL_TABLET | Freq: Four times a day (QID) | ORAL | 0 refills | Status: AC | PRN

## 2023-12-07 MED ORDER — DEXAMETHASONE SODIUM PHOSPHATE 10 MG/ML IJ SOLN
INTRAMUSCULAR | Status: DC | PRN
  Administered 2023-12-07: 5 mg via INTRAVENOUS

## 2023-12-07 MED ORDER — OXYCODONE HCL 5 MG PO TABS
5.0000 mg | ORAL_TABLET | Freq: Once | ORAL | Status: AC | PRN
  Administered 2023-12-07: 5 mg via ORAL

## 2023-12-07 MED ORDER — CIPROFLOXACIN IN D5W 400 MG/200ML IV SOLN
INTRAVENOUS | Status: AC
  Filled 2023-12-07: qty 200

## 2023-12-07 SURGICAL SUPPLY — 33 items
BLADE SURG 15 STRL LF DISP TIS (BLADE) ×1 IMPLANT
CANISTER SUCT 1200ML W/VALVE (MISCELLANEOUS) IMPLANT
CHLORAPREP W/TINT 26 (MISCELLANEOUS) ×1 IMPLANT
CLIP TI WIDE RED SMALL 6 (CLIP) IMPLANT
COVER BACK TABLE 60X90IN (DRAPES) ×1 IMPLANT
COVER MAYO STAND STRL (DRAPES) ×1 IMPLANT
DERMABOND ADVANCED .7 DNX12 (GAUZE/BANDAGES/DRESSINGS) ×1 IMPLANT
DRAPE LAPAROTOMY 100X72 PEDS (DRAPES) ×1 IMPLANT
DRAPE UTILITY XL STRL (DRAPES) ×1 IMPLANT
ELECTRODE REM PT RTRN 9FT ADLT (ELECTROSURGICAL) ×1 IMPLANT
GAUZE SPONGE 4X4 12PLY STRL LF (GAUZE/BANDAGES/DRESSINGS) ×1 IMPLANT
GLOVE BIO SURGEON STRL SZ7 (GLOVE) IMPLANT
GLOVE BIOGEL PI IND STRL 7.0 (GLOVE) IMPLANT
GLOVE SURG SIGNA 7.5 PF LTX (GLOVE) ×1 IMPLANT
GOWN STRL REUS W/ TWL LRG LVL3 (GOWN DISPOSABLE) ×1 IMPLANT
GOWN STRL REUS W/ TWL XL LVL3 (GOWN DISPOSABLE) ×1 IMPLANT
KIT MARKER MARGIN INK (KITS) ×1 IMPLANT
NDL HYPO 25X1 1.5 SAFETY (NEEDLE) ×1 IMPLANT
NEEDLE HYPO 25X1 1.5 SAFETY (NEEDLE) ×1 IMPLANT
NS IRRIG 1000ML POUR BTL (IV SOLUTION) ×1 IMPLANT
PACK BASIN DAY SURGERY FS (CUSTOM PROCEDURE TRAY) ×1 IMPLANT
PENCIL SMOKE EVACUATOR (MISCELLANEOUS) ×1 IMPLANT
SLEEVE SCD COMPRESS KNEE MED (STOCKING) IMPLANT
SPIKE FLUID TRANSFER (MISCELLANEOUS) IMPLANT
SPONGE T-LAP 4X18 ~~LOC~~+RFID (SPONGE) ×1 IMPLANT
SUT MNCRL AB 4-0 PS2 18 (SUTURE) ×1 IMPLANT
SUT SILK 2 0 SH (SUTURE) ×1 IMPLANT
SUT VIC AB 3-0 SH 27X BRD (SUTURE) ×1 IMPLANT
SYR CONTROL 10ML LL (SYRINGE) ×1 IMPLANT
TOWEL GREEN STERILE FF (TOWEL DISPOSABLE) ×1 IMPLANT
TRAY FAXITRON CT DISP (TRAY / TRAY PROCEDURE) IMPLANT
TUBE CONNECTING 20X1/4 (TUBING) IMPLANT
YANKAUER SUCT BULB TIP NO VENT (SUCTIONS) IMPLANT

## 2023-12-07 NOTE — Anesthesia Postprocedure Evaluation (Signed)
 Anesthesia Post Note  Patient: Sandra Brennan  Procedure(s) Performed: EXCISION, CYST, BREAST (Right: Breast)     Patient location during evaluation: PACU Anesthesia Type: MAC Level of consciousness: awake Pain management: pain level controlled Vital Signs Assessment: post-procedure vital signs reviewed and stable Respiratory status: spontaneous breathing, nonlabored ventilation and respiratory function stable Cardiovascular status: stable and blood pressure returned to baseline Postop Assessment: no apparent nausea or vomiting Anesthetic complications: no   No notable events documented.  Last Vitals:  Vitals:   12/07/23 0915 12/07/23 0930  BP: (!) 144/68 (!) 158/78  Pulse: (!) 59 63  Resp: 16 12  Temp:    SpO2: 100% 100%    Last Pain:  Vitals:   12/07/23 0909  PainSc: 0-No pain                 Delon Aisha Arch

## 2023-12-07 NOTE — Anesthesia Preprocedure Evaluation (Addendum)
 Anesthesia Evaluation  Patient identified by MRN, date of birth, ID band Patient awake    Reviewed: Allergy & Precautions, NPO status , Patient's Chart, lab work & pertinent test results  History of Anesthesia Complications Negative for: history of anesthetic complications  Airway Mallampati: III  TM Distance: >3 FB Neck ROM: Full   Comment: Previous grade I view with Miller 2 in 2014, easy mask Dental  (+) Dental Advisory Given, Edentulous Upper, Edentulous Lower   Pulmonary neg shortness of breath, asthma , sleep apnea and Continuous Positive Airway Pressure Ventilation , COPD (last used inhaler ~1 month ago),  COPD inhaler, neg recent URI, former smoker   Pulmonary exam normal breath sounds clear to auscultation       Cardiovascular hypertension (amlodipine , lisinopril , fuorsemide), Pt. on medications (-) angina + Past MI (mild, when her son died)  (-) Cardiac Stents and (-) CABG (-) dysrhythmias  Rhythm:Regular Rate:Normal  HLD  TTE 07/07/2022: IMPRESSIONS    1. Left ventricular ejection fraction, by estimation, is 55 to 60%. The  left ventricle has normal function. The left ventricle has no regional  wall motion abnormalities. Left ventricular diastolic parameters are  consistent with Grade I diastolic  dysfunction (impaired relaxation).   2. Right ventricular systolic function is normal. The right ventricular  size is normal.   3. Left atrial size was mildly dilated.   4. Right atrial size was mildly dilated.   5. On the parasternal long axis images, there is a mobile density on the  anterior leaflet of the mitral valve, this may be mobile calcium  but if  clinical suspicion exists for endocarditis would recommend TEE to further  evalaute. The mitral valve is  normal in structure. No evidence of mitral valve regurgitation. No  evidence of mitral stenosis.   6. The aortic valve is tricuspid. There is mild calcification of  the  aortic valve. Aortic valve regurgitation is not visualized. Aortic valve  sclerosis/calcification is present, without any evidence of aortic  stenosis.   7. The inferior vena cava is normal in size with greater than 50%  respiratory variability, suggesting right atrial pressure of 3 mmHg.     Neuro/Psych  Headaches, neg Seizures PSYCHIATRIC DISORDERS  Depression     Neuromuscular disease (cervical spinal stenosis, lumbar radiculitis) CVA (>40 years ago), No Residual Symptoms    GI/Hepatic Neg liver ROS,GERD  Medicated,,  Endo/Other  diabetes, Type 2  Class 3 obesity  Renal/GU CRFRenal disease     Musculoskeletal  (+) Arthritis ,  Fibromyalgia -Osteoporosis    Abdominal  (+) + obese  Peds  Hematology negative hematology ROS (+)   Anesthesia Other Findings Last semaglutide : 11/24/2023  Reproductive/Obstetrics                              Anesthesia Physical Anesthesia Plan  ASA: 3  Anesthesia Plan: MAC   Post-op Pain Management: Tylenol  PO (pre-op)*   Induction: Intravenous  PONV Risk Score and Plan: 2 and Ondansetron , Dexamethasone , Propofol  infusion, TIVA and Treatment may vary due to age or medical condition  Airway Management Planned: Natural Airway and Simple Face Mask  Additional Equipment:   Intra-op Plan:   Post-operative Plan:   Informed Consent: I have reviewed the patients History and Physical, chart, labs and discussed the procedure including the risks, benefits and alternatives for the proposed anesthesia with the patient or authorized representative who has indicated his/her understanding and acceptance.  Dental advisory given  Plan Discussed with: CRNA and Anesthesiologist  Anesthesia Plan Comments: (Discussed with patient risks of MAC including, but not limited to, minor pain or discomfort, hearing people in the room, and possible need for backup general anesthesia. Risks for general anesthesia also discussed  including, but not limited to, sore throat, hoarse voice, chipped/damaged teeth, injury to vocal cords, nausea and vomiting, allergic reactions, lung infection, heart attack, stroke, and death. All questions answered. )         Anesthesia Quick Evaluation

## 2023-12-07 NOTE — Discharge Instructions (Addendum)
 You may shower starting tomorrow  No vigorous activity for 1 week  You may use Tylenol  and an ice pack for discomfort as well   Post Anesthesia Home Care Instructions  Activity: Get plenty of rest for the remainder of the day. A responsible individual must stay with you for 24 hours following the procedure.  For the next 24 hours, DO NOT: -Drive a car -Advertising copywriter -Drink alcoholic beverages -Take any medication unless instructed by your physician -Make any legal decisions or sign important papers.  Meals: Start with liquid foods such as gelatin or soup. Progress to regular foods as tolerated. Avoid greasy, spicy, heavy foods. If nausea and/or vomiting occur, drink only clear liquids until the nausea and/or vomiting subsides. Call your physician if vomiting continues.  Special Instructions/Symptoms: Your throat may feel dry or sore from the anesthesia or the breathing tube placed in your throat during surgery. If this causes discomfort, gargle with warm salt water. The discomfort should disappear within 24 hours.  If you had a scopolamine patch placed behind your ear for the management of post- operative nausea and/or vomiting:  1. The medication in the patch is effective for 72 hours, after which it should be removed.  Wrap patch in a tissue and discard in the trash. Wash hands thoroughly with soap and water. 2. You may remove the patch earlier than 72 hours if you experience unpleasant side effects which may include dry mouth, dizziness or visual disturbances. 3. Avoid touching the patch. Wash your hands with soap and water after contact with the patch.  No tylenol  until after 1:45pm today.

## 2023-12-07 NOTE — Op Note (Signed)
   Sandra Brennan 12/07/2023   Pre-op Diagnosis: RIGHT BREAST CHRONIC SEBACEOUS CYST     Post-op Diagnosis: SAME  Procedure(s): EXCISION RIGHT BREAST CHRONIC SEBACEOUS CYST (1 CM)  Surgeon(s): Vernetta Berg, MD  Anesthesia: Monitor Anesthesia Care  Staff:  Circulator: Lelon Daphne BROCKS, RN Scrub Person: Wilmon Antonio SQUIBB, RN  Estimated Blood Loss: Minimal               Specimens: SENT TO PATH  Findings: The patient was found to have a medial right breast cyst along the inframammary ridge which appeared consistent with a chronically infected sebaceous cyst.  It measured approximately 1 cm  Procedure: The patient was brought to operating identifies correct patient.  She was placed upon the operating table and anesthesia was induced.  Her right breast and upper abdomen were then prepped and draped in usual sterile fashion.  I anesthetized skin around the palpable mass with lidocaine .  I then made elliptical incision with a scalpel.  I then dissected circumferentially into the subcutaneous tissue with the cautery.  I excised the entire cyst in its capsule with the cautery without entering the cyst.  It appeared consistent with a sebaceous cyst.  I then closed the subcutaneous tissue with interrupted 3-0 Vicryl sutures and closed the skin with a running 4-0 Monocryl.  Dermabond is then applied.  The patient tolerated the procedure well.  All the counts were correct at the end of procedure.  The patient was then taken in stable condition from the operating room to the recovery room.          Berg Vernetta   Date: 12/07/2023  Time: 9:02 AM

## 2023-12-07 NOTE — Interval H&P Note (Signed)
 History and Physical Interval Note:no change in H and P  12/07/2023 8:10 AM  Meade HERO Frigon  has presented today for surgery, with the diagnosis of RIGHT BREAST SEBACEOUS CYST.  The various methods of treatment have been discussed with the patient and family. After consideration of risks, benefits and other options for treatment, the patient has consented to  Procedure(s) with comments: EXCISION, CYST, BREAST (Right) - EXCISION RIGHT BREAST CYST as a surgical intervention.  The patient's history has been reviewed, patient examined, no change in status, stable for surgery.  I have reviewed the patient's chart and labs.  Questions were answered to the patient's satisfaction.     Sandra Brennan

## 2023-12-07 NOTE — Transfer of Care (Signed)
 Immediate Anesthesia Transfer of Care Note  Patient: Sandra Brennan  Procedure(s) Performed: EXCISION, CYST, BREAST (Right: Breast)  Patient Location: PACU  Anesthesia Type:MAC  Level of Consciousness: awake, alert , and patient cooperative  Airway & Oxygen  Therapy: Patient Spontanous Breathing and Patient connected to face mask oxygen   Post-op Assessment: Report given to RN and Post -op Vital signs reviewed and stable  Post vital signs: Reviewed and stable  Last Vitals:  Vitals Value Taken Time  BP    Temp    Pulse 65 12/07/23 09:09  Resp 19 12/07/23 09:09  SpO2 97 % 12/07/23 09:09  Vitals shown include unfiled device data.  Last Pain:  Vitals:   12/07/23 0739  PainSc: 7       Patients Stated Pain Goal: 7 (12/07/23 0739)  Complications: No notable events documented.

## 2023-12-08 ENCOUNTER — Encounter (HOSPITAL_BASED_OUTPATIENT_CLINIC_OR_DEPARTMENT_OTHER): Payer: Self-pay | Admitting: Surgery

## 2023-12-08 LAB — SURGICAL PATHOLOGY

## 2023-12-22 ENCOUNTER — Telehealth: Payer: Self-pay | Admitting: Nurse Practitioner

## 2023-12-22 DIAGNOSIS — G4733 Obstructive sleep apnea (adult) (pediatric): Secondary | ICD-10-CM | POA: Diagnosis not present

## 2023-12-22 DIAGNOSIS — Z780 Asymptomatic menopausal state: Secondary | ICD-10-CM

## 2023-12-22 NOTE — Telephone Encounter (Signed)
 Please reorder Dexa scan at [facility]. [facility] is no longer scheduling Dexa/Bone Density scans. Please route message back to Admin team to contact patient for scheduling.

## 2023-12-30 ENCOUNTER — Other Ambulatory Visit: Payer: Self-pay | Admitting: Nurse Practitioner

## 2023-12-30 DIAGNOSIS — R600 Localized edema: Secondary | ICD-10-CM

## 2023-12-30 DIAGNOSIS — E1142 Type 2 diabetes mellitus with diabetic polyneuropathy: Secondary | ICD-10-CM

## 2023-12-30 DIAGNOSIS — L304 Erythema intertrigo: Secondary | ICD-10-CM

## 2023-12-30 DIAGNOSIS — Z6841 Body Mass Index (BMI) 40.0 and over, adult: Secondary | ICD-10-CM

## 2024-01-04 ENCOUNTER — Encounter: Payer: Self-pay | Admitting: Radiology

## 2024-01-15 ENCOUNTER — Ambulatory Visit: Admitting: Nurse Practitioner

## 2024-01-15 ENCOUNTER — Encounter: Payer: Self-pay | Admitting: Nurse Practitioner

## 2024-01-15 VITALS — BP 130/72 | HR 64 | Temp 97.7°F | Ht 59.0 in | Wt 234.8 lb

## 2024-01-15 DIAGNOSIS — N1832 Chronic kidney disease, stage 3b: Secondary | ICD-10-CM | POA: Diagnosis not present

## 2024-01-15 DIAGNOSIS — E1169 Type 2 diabetes mellitus with other specified complication: Secondary | ICD-10-CM | POA: Diagnosis not present

## 2024-01-15 DIAGNOSIS — R42 Dizziness and giddiness: Secondary | ICD-10-CM

## 2024-01-15 DIAGNOSIS — I10 Essential (primary) hypertension: Secondary | ICD-10-CM

## 2024-01-15 DIAGNOSIS — E1142 Type 2 diabetes mellitus with diabetic polyneuropathy: Secondary | ICD-10-CM

## 2024-01-15 DIAGNOSIS — I5032 Chronic diastolic (congestive) heart failure: Secondary | ICD-10-CM | POA: Diagnosis not present

## 2024-01-15 DIAGNOSIS — M26621 Arthralgia of right temporomandibular joint: Secondary | ICD-10-CM

## 2024-01-15 DIAGNOSIS — Z7985 Long-term (current) use of injectable non-insulin antidiabetic drugs: Secondary | ICD-10-CM

## 2024-01-15 DIAGNOSIS — E785 Hyperlipidemia, unspecified: Secondary | ICD-10-CM

## 2024-01-15 LAB — COMPREHENSIVE METABOLIC PANEL WITH GFR
ALT: 9 U/L (ref 0–35)
AST: 15 U/L (ref 0–37)
Albumin: 4 g/dL (ref 3.5–5.2)
Alkaline Phosphatase: 89 U/L (ref 39–117)
BUN: 9 mg/dL (ref 6–23)
CO2: 31 meq/L (ref 19–32)
Calcium: 9.5 mg/dL (ref 8.4–10.5)
Chloride: 104 meq/L (ref 96–112)
Creatinine, Ser: 1.02 mg/dL (ref 0.40–1.20)
GFR: 53.4 mL/min — ABNORMAL LOW (ref >60.00)
Glucose, Bld: 75 mg/dL (ref 70–99)
Potassium: 5.5 meq/L — ABNORMAL HIGH (ref 3.5–5.1)
Sodium: 140 meq/L (ref 135–145)
Total Bilirubin: 0.4 mg/dL (ref 0.2–1.2)
Total Protein: 7.4 g/dL (ref 6.0–8.3)

## 2024-01-15 LAB — CBC
HCT: 38.3 % (ref 36.0–46.0)
Hemoglobin: 12.4 g/dL (ref 12.0–15.0)
MCHC: 32.3 g/dL (ref 30.0–36.0)
MCV: 89.8 fl (ref 78.0–100.0)
Platelets: 267 K/uL (ref 150.0–400.0)
RBC: 4.27 Mil/uL (ref 3.87–5.11)
RDW: 13.4 % (ref 11.5–15.5)
WBC: 5.7 K/uL (ref 4.0–10.5)

## 2024-01-15 LAB — TSH: TSH: 1.02 u[IU]/mL (ref 0.35–5.50)

## 2024-01-15 NOTE — Assessment & Plan Note (Signed)
 Repeat CMP

## 2024-01-15 NOTE — Assessment & Plan Note (Addendum)
 Chronic, intermittent, worse with prolonged standing, led to hospitalization 07/2022. No clear etiology found, suspected hypovolemia. No acute finding on CT head, CT chest/abd, ECG, and echocardiogram.  Advised to maintain adequate oral hydration, use of compression stocking during the day, and use of walker at all times.

## 2024-01-15 NOTE — Assessment & Plan Note (Addendum)
 Positive DIABETES nephropathy and neuropathy, elevated UACr Under the care of nephrology LDL at goal with statin BP at goal Advised to schedule DM eye exam Current use of Lisinopril  40mg , ozempic  0.5mg  weekly Unable to tolerate jardiance -itching and sore throat  Maintain med doses Repeat CMP, and hgbA1c F/up in 6months

## 2024-01-15 NOTE — Assessment & Plan Note (Addendum)
 No LE edema, or PND or CP or SOB. Current use of furosemide  10mg  daily, amlodipine  10mg , and lisinopril  40mg  No betablocker at this time. Last echo 2024: Left ventricular ejection fraction, by estimation, is 55 to 60%. The left ventricle has normal function. The left ventricle has no regional wall motion abnormalities. Left ventricular diastolic parameters are consistent with Grade I diastolic  dysfunction (impaired relaxation). Right ventricular systolic function is normal. The right ventricular size is normal. Left atrial size was mildly dilated. Right atrial size was mildly dilated. On the parasternal long axis images, there is a mobile density on the anterior leaflet of the mitral valve, this may be mobile calcium  but if clinical suspicion exists for endocarditis would recommend TEE to further evaluate. The mitral valve is normal in structure. No evidence of mitral valve regurgitation. No evidence of mitral stenosis. The aortic valve is tricuspid. There is mild calcification of the aortic valve. Aortic valve regurgitation is not visualized. Aortic valve sclerosis/calcification is present, without any evidence of aortic stenosis. he inferior vena cava is normal in size with greater than 50% respiratory variability, suggesting right atrial pressure of 3 mmHg.  Conclusion(s)/Recommendation(s): On the parasternal long axis images, there is a mobile density on the anterior leaflet of the mitral valve, this may be mobile calcium  but if clinical suspicion exists for endocarditis would recommend TEE to further evaluate.

## 2024-01-15 NOTE — Progress Notes (Signed)
 Established Patient Visit  Patient: Sandra Brennan   DOB: 1945-08-06   78 y.o. Female  MRN: [id number] Visit Date: 01/15/2024  Subjective:    Chief Complaint  Patient presents with   Follow-up    FASTING 3 month follow up for DM, HTN, Hyperlipidemia    HPI Hypertension Improved BP control BP Readings from Last 3 Encounters:  01/15/24 130/72  12/07/23 (!) 155/79  10/15/23 138/76    Maintain current med doses F/up in 6months  Episodic lightheadedness Chronic, intermittent, worse with prolonged standing, led to hospitalization 07/2022. No clear etiology found, suspected hypovolemia. No acute finding on CT head, CT chest/abd, ECG, and echocardiogram.  Advised to maintain adequate oral hydration, use of compression stocking during the day, and use of walker at all times.  CKD (chronic kidney disease) stage 3, GFR 30-59 ml/min (HCC) Repeat CMP  Chronic diastolic (congestive) heart failure (HCC) No LE edema, or PND or CP or SOB. Current use of furosemide  10mg  daily, amlodipine  10mg , and lisinopril  40mg  No betablocker at this time. Last echo 2024: Left ventricular ejection fraction, by estimation, is 55 to 60%. The left ventricle has normal function. The left ventricle has no regional wall motion abnormalities. Left ventricular diastolic parameters are consistent with Grade I diastolic  dysfunction (impaired relaxation). Right ventricular systolic function is normal. The right ventricular size is normal. Left atrial size was mildly dilated. Right atrial size was mildly dilated. On the parasternal long axis images, there is a mobile density on the anterior leaflet of the mitral valve, this may be mobile calcium  but if clinical suspicion exists for endocarditis would recommend TEE to further evaluate. The mitral valve is normal in structure. No evidence of mitral valve regurgitation. No evidence of mitral stenosis. The aortic valve is tricuspid. There is mild calcification  of the aortic valve. Aortic valve regurgitation is not visualized. Aortic valve sclerosis/calcification is present, without any evidence of aortic stenosis. he inferior vena cava is normal in size with greater than 50% respiratory variability, suggesting right atrial pressure of 3 mmHg.  Conclusion(s)/Recommendation(s): On the parasternal long axis images, there is a mobile density on the anterior leaflet of the mitral valve, this may be mobile calcium  but if clinical suspicion exists for endocarditis would recommend TEE to further evaluate.   Type 2 diabetes mellitus with diabetic polyneuropathy, without long-term current use of insulin  (HCC) Positive DIABETES nephropathy and neuropathy, elevated UACr Under the care of nephrology LDL at goal with statin BP at goal Advised to schedule DM eye exam Current use of Lisinopril  40mg , ozempic  0.5mg  weekly Unable to tolerate jardiance -itching and sore throat  Maintain med doses Repeat CMP, and hgbA1c F/up in 6months  Wt Readings from Last 3 Encounters:  01/15/24 234 lb 12.8 oz (106.5 kg)  12/07/23 238 lb 1.6 oz (108 kg)  10/15/23 243 lb 6.4 oz (110.4 kg)     Right jaw and ear pain with chewing, onset several weeks ago, unable to use dentures. No hearing loss, no headache, no fever, no rash.  Reviewed medical, surgical, and social history today  Medications: Outpatient Medications Prior to Visit  Medication Sig   acetaminophen  (TYLENOL  8 HOUR ARTHRITIS PAIN) 650 MG CR tablet Take 1,300 mg by mouth every 8 (eight) hours as needed (for headaches).   albuterol  (VENTOLIN  HFA) 108 (90 Base) MCG/ACT inhaler INHALE 1 PUFF INTO THE LUNGS EVERY 6 (SIX) HOURS AS NEEDED FOR SHORTNESS OF  BREATH OR WHEEZING.   allopurinol  (ZYLOPRIM ) 100 MG tablet Take 1 tablet (100 mg total) by mouth daily.   amLODipine  (NORVASC ) 10 MG tablet TAKE 1 TABLET BY MOUTH EVERYDAY AT BEDTIME   aspirin  EC 81 MG tablet Take 81 mg by mouth daily.   atorvastatin  (LIPITOR) 20 MG  tablet Take 1 tablet (20 mg total) by mouth every evening.   cetirizine  (ZYRTEC ) 10 MG tablet Take 10 mg by mouth in the morning.   cholecalciferol  (VITAMIN D3) 25 MCG (1000 UNIT) tablet Take 1,000 Units by mouth daily.   escitalopram  (LEXAPRO ) 10 MG tablet TAKE 1 TABLET BY MOUTH EVERY DAY   furosemide  (LASIX ) 20 MG tablet TAKE 1/2 TABLET BY MOUTH DAILY   lisinopril  (ZESTRIL ) 40 MG tablet TAKE 1 TABLET BY MOUTH EVERY DAY   Multiple Vitamin (MULTIVITAMIN) tablet Take 1 tablet by mouth daily with breakfast.   nystatin  (MYCOSTATIN /NYSTOP ) powder APPLY TOPICALLY 3 TIMES A DAY   omeprazole  (PRILOSEC) 20 MG capsule TAKE 1 CAPSULE BY MOUTH DAILY BEFORE BREAKFAST.   OPCON-A  0.027-0.315 % SOLN Place 1 drop into both eyes 3 (three) times daily as needed (for itching).   polycarbophil (FIBERCON) 625 MG tablet Take 625 mg by mouth daily.   Polyethylene Glycol 3350  (MIRALAX  PO) Take 17 g by mouth as needed.   Rimegepant Sulfate  (NURTEC) 75 MG TBDP Take 1 tablet (75 mg total) by mouth as needed (take 1 at onset of headache, max is 75 in 24 hours).   Semaglutide ,0.25 or 0.5MG /DOS, (OZEMPIC , 0.25 OR 0.5 MG/DOSE,) 2 MG/3ML SOPN INJECT 0.5MG  INTO THE SKIN EVERY TUESDAY   SYMBICORT  160-4.5 MCG/ACT inhaler Inhale 2 puffs into the lungs in the morning and at bedtime. Rinse mouth after each use   traMADol  (ULTRAM ) 50 MG tablet Take 1 tablet (50 mg total) by mouth 3 (three) times daily.   traMADol  (ULTRAM ) 50 MG tablet Take 1 tablet (50 mg total) by mouth every 6 (six) hours as needed for moderate pain (pain score 4-6).   No facility-administered medications prior to visit.   Reviewed past medical and social history.   ROS per HPI above      Objective:  BP 130/72 (BP Location: Left Arm, Patient Position: Sitting, Cuff Size: Large)   Pulse 64   Temp 97.7 F (36.5 C) (Oral)   Ht 4' 11 (1.499 m)   Wt 234 lb 12.8 oz (106.5 kg)   SpO2 99%   BMI 47.42 kg/m      Physical Exam HENT:     Head:  Normocephalic.     Jaw: Tenderness and pain on movement present. No trismus, swelling or malocclusion.     Salivary Glands: Right salivary gland is not diffusely enlarged or tender. Left salivary gland is not diffusely enlarged or tender.     Comments: Tenderness over right TMJ joint    Right Ear: Tympanic membrane, ear canal and external ear normal.     Left Ear: Tympanic membrane, ear canal and external ear normal.     Mouth/Throat:     Dentition: No gingival swelling or gum lesions.     Tongue: No lesions. Tongue does not deviate from midline.     Palate: No mass and lesions.     Pharynx: Oropharynx is clear. Uvula midline.  Cardiovascular:     Rate and Rhythm: Normal rate and regular rhythm.     Pulses: Normal pulses.     Heart sounds: Normal heart sounds.  Pulmonary:     Effort: Pulmonary effort  is normal.     Breath sounds: Normal breath sounds.  Musculoskeletal:     Cervical back: Normal range of motion and neck supple.     Right lower leg: No edema.     Left lower leg: No edema.  Lymphadenopathy:     Cervical: No cervical adenopathy.  Neurological:     Mental Status: She is oriented to person, place, and time.  Psychiatric:        Mood and Affect: Mood normal.        Behavior: Behavior normal.        Thought Content: Thought content normal.     No results found for any visits on 01/15/24.    Assessment & Plan:    Problem List Items Addressed This Visit     Chronic diastolic (congestive) heart failure (HCC)   No LE edema, or PND or CP or SOB. Current use of furosemide  10mg  daily, amlodipine  10mg , and lisinopril  40mg  No betablocker at this time. Last echo 2024: Left ventricular ejection fraction, by estimation, is 55 to 60%. The left ventricle has normal function. The left ventricle has no regional wall motion abnormalities. Left ventricular diastolic parameters are consistent with Grade I diastolic  dysfunction (impaired relaxation). Right ventricular systolic  function is normal. The right ventricular size is normal. Left atrial size was mildly dilated. Right atrial size was mildly dilated. On the parasternal long axis images, there is a mobile density on the anterior leaflet of the mitral valve, this may be mobile calcium  but if clinical suspicion exists for endocarditis would recommend TEE to further evaluate. The mitral valve is normal in structure. No evidence of mitral valve regurgitation. No evidence of mitral stenosis. The aortic valve is tricuspid. There is mild calcification of the aortic valve. Aortic valve regurgitation is not visualized. Aortic valve sclerosis/calcification is present, without any evidence of aortic stenosis. he inferior vena cava is normal in size with greater than 50% respiratory variability, suggesting right atrial pressure of 3 mmHg.  Conclusion(s)/Recommendation(s): On the parasternal long axis images, there is a mobile density on the anterior leaflet of the mitral valve, this may be mobile calcium  but if clinical suspicion exists for endocarditis would recommend TEE to further evaluate.       CKD (chronic kidney disease) stage 3, GFR 30-59 ml/min (HCC)   Repeat CMP      Relevant Orders   Comprehensive metabolic panel with GFR   CBC   Episodic lightheadedness   Chronic, intermittent, worse with prolonged standing, led to hospitalization 07/2022. No clear etiology found, suspected hypovolemia. No acute finding on CT head, CT chest/abd, ECG, and echocardiogram.  Advised to maintain adequate oral hydration, use of compression stocking during the day, and use of walker at all times.      Relevant Orders   Comprehensive metabolic panel with GFR   CBC   TSH   Hyperlipidemia associated with type 2 diabetes mellitus (HCC)   Relevant Orders   Comprehensive metabolic panel with GFR   Hypertension (Chronic)   Improved BP control BP Readings from Last 3 Encounters:  01/15/24 130/72  12/07/23 (!) 155/79  10/15/23 138/76     Maintain current med doses F/up in 6months      Relevant Orders   TSH   Type 2 diabetes mellitus with diabetic polyneuropathy, without long-term current use of insulin  (HCC) - Primary (Chronic)   Positive DIABETES nephropathy and neuropathy, elevated UACr Under the care of nephrology LDL at goal with statin BP at  goal Advised to schedule DM eye exam Current use of Lisinopril  40mg , ozempic  0.5mg  weekly Unable to tolerate jardiance -itching and sore throat  Maintain med doses Repeat CMP, and hgbA1c F/up in 6months      Relevant Orders   Hemoglobin A1c   Comprehensive metabolic panel with GFR   Other Visit Diagnoses       Arthralgia of right temporomandibular joint          Provided home TMJ exercises. Consider referral to [place] TMJ specialist if no improvement.  Return in about 6 months (around 07/14/2024) for HTN, DM, hyperlipidemia (fasting).     Roselie Mood, NP

## 2024-01-15 NOTE — Patient Instructions (Addendum)
 Go to lab Maintain Heart healthy diet and daily exercise. Maintain current medications. Schedule appointment for DIABETES eye exam  Jaw Range of Motion Exercises Jaw range of motion exercises are exercises that help your jaw move better. Exercises that help you have good posture (postural exercises) also help relieve jaw discomfort. These are often done along with range of motion exercises. These exercises can help prevent or improve: Trouble opening your mouth. Pain in your jaw while it is open or closed. Temporomandibular joint (TMJ) pain. Headache caused by jaw tension. Take other actions to prevent or relieve jaw pain, such as: Avoiding things that cause or increase jaw pain. This may include: Chewing gum or eating hard foods. Clenching your jaw or teeth, grinding your teeth, or keeping tension in your jaw muscles. Opening your mouth wide, such as for a big yawn. Leaning on your jaw, such as resting your jaw in your hand while leaning on a desk. Putting ice on your jaw. To do this: Put ice in a plastic bag. Place a towel between your skin and the bag. Leave the ice on for 20 minutes, 2-3 times a day. Remove the ice if your skin turns bright red. This is very important. If you cannot feel pain, heat, or cold, you have a greater risk of damage to the area. Only do jaw exercises that your health care provider recommends. Only move your jaw as far as it can comfortably go in each direction. Do not move your jaw into positions that cause pain. Range of motion exercises Repeat each of these exercises 8 times, 1-2 times a day, or as told by your health care provider. Exercise A: Forward protrusion  Push your jaw forward. Hold this position for 1-2 seconds. Let your jaw return to its normal position and rest it there for 1-2 seconds. Exercise B: Controlled opening  Stand or sit in front of a mirror. Place your tongue on the roof of your mouth, just behind your top teeth. Keeping your  tongue on the roof of your mouth, slowly open and close your mouth. While you open and close your mouth, watch your jaw in the mirror. Try to keep your jaw from moving to one side or the other. Exercise C: Right and left motion  Move your jaw right. Hold this position for 1-2 seconds. Let your jaw return to its normal position, and rest it there for 1-2 seconds. Move your jaw left. Hold this position for 1-2 seconds. Let your jaw return to its normal position, and rest it there for 1-2 seconds. Postural exercises Exercise A: Chin tucks  You can do this exercise sitting, standing, or lying down. Move your head straight back, keeping your head level. You can guide the movement by placing your fingers on your chin to push your jaw back in an even motion. You should be able to feel a double chin form at the end of the motion. Hold this position for 5 seconds. Repeat 10-15 times. Exercise B: Shoulder blade squeeze  Sit or stand. Bend your elbows to about 90 degrees, which is the shape of a capital letter L. Keep your upper arms by your body. Squeeze your shoulder blades down and back, as though you were trying to touch your elbows behind you. Do not shrug your shoulders or move your head. Hold this position for 5 seconds. Repeat 10-15 times. Exercise C: Chest stretch  Stand in a doorway with one of your feet slightly in front of the other.  This is called a staggered stance. If you cannot reach your forearms to the door frame, do this exercise in a corner of a room. Put both of your hands and your forearms on the door frame, with your arms wide apart. Make sure your arms are at a 90-degree angle to your body. Place your hands on the door frame at the height of your elbows. Slowly move your weight onto your front foot until you feel a stretch across your chest and in the front of your shoulders. Keep your head and chest upright and keep your abdominal muscles tight. Do not lean in. Hold this  position for 30 seconds. Repeat 3 times. Contact a health care provider if: You have jaw pain that is new or gets worse. You have clicking or popping sounds while doing the exercises. Get help right away if: Your jaw is stuck in one place and you cannot move it. You cannot open or close your mouth. Summary Jaw range of motion exercises are exercises that help your jaw move better. Take actions to prevent or relieve jaw pain: limit chewing gum or eating hard foods; clenching your jaw or teeth; or leaning on your jaw, such as resting your jaw in your hand while leaning on a desk. Repeat each of the jaw range of motion exercises 8 times, 1-2 times a day, or as told by your health care provider. Contact a health care provider if you have clicking or popping sounds while doing the exercises. This information is not intended to replace advice given to you by your health care provider. Make sure you discuss any questions you have with your health care provider. Document Revised: 09/30/2020 Document Reviewed: 09/30/2020 Elsevier Patient Education  2024 [employer].

## 2024-01-15 NOTE — Assessment & Plan Note (Signed)
 Improved BP control BP Readings from Last 3 Encounters:  01/15/24 130/72  12/07/23 (!) 155/79  10/15/23 138/76    Maintain current med doses F/up in 6months

## 2024-01-18 LAB — HEMOGLOBIN A1C: Hgb A1c MFr Bld: 5.3 % (ref 4.6–6.5)

## 2024-01-19 ENCOUNTER — Emergency Department (HOSPITAL_COMMUNITY)

## 2024-01-19 ENCOUNTER — Emergency Department (HOSPITAL_COMMUNITY)
Admission: EM | Admit: 2024-01-19 | Discharge: 2024-01-19 | Disposition: A | Discharge status: Home / Self Care | Attending: Emergency Medicine | Admitting: Emergency Medicine

## 2024-01-19 ENCOUNTER — Encounter (HOSPITAL_COMMUNITY): Payer: Self-pay

## 2024-01-19 DIAGNOSIS — M545 Low back pain, unspecified: Secondary | ICD-10-CM | POA: Insufficient documentation

## 2024-01-19 DIAGNOSIS — R10819 Abdominal tenderness, unspecified site: Secondary | ICD-10-CM | POA: Diagnosis not present

## 2024-01-19 DIAGNOSIS — Z7982 Long term (current) use of aspirin: Secondary | ICD-10-CM | POA: Diagnosis not present

## 2024-01-19 DIAGNOSIS — M549 Dorsalgia, unspecified: Secondary | ICD-10-CM | POA: Diagnosis present

## 2024-01-19 LAB — URINALYSIS, ROUTINE W REFLEX MICROSCOPIC
Bilirubin Urine: NEGATIVE
Glucose, UA: NEGATIVE mg/dL
Hgb urine dipstick: NEGATIVE
Ketones, ur: NEGATIVE mg/dL
Leukocytes,Ua: NEGATIVE
Nitrite: NEGATIVE
Protein, ur: 30 mg/dL — AB
Specific Gravity, Urine: 1.014 (ref 1.005–1.030)
pH: 8 (ref 5.0–8.0)

## 2024-01-19 LAB — LIPASE, BLOOD: Lipase: 29 U/L (ref 11–51)

## 2024-01-19 LAB — CBC
HCT: 39.4 % (ref 36.0–46.0)
Hemoglobin: 12.4 g/dL (ref 12.0–15.0)
MCH: 29.5 pg (ref 26.0–34.0)
MCHC: 31.5 g/dL (ref 30.0–36.0)
MCV: 93.6 fL (ref 80.0–100.0)
Platelets: 247 K/uL (ref 150–400)
RBC: 4.21 MIL/uL (ref 3.87–5.11)
RDW: 12.7 % (ref 11.5–15.5)
WBC: 6.6 K/uL (ref 4.0–10.5)
nRBC: 0 % (ref 0.0–0.2)

## 2024-01-19 LAB — COMPREHENSIVE METABOLIC PANEL WITH GFR
ALT: 51 U/L — ABNORMAL HIGH (ref 0–44)
AST: 177 U/L — ABNORMAL HIGH (ref 15–41)
Albumin: 3.3 g/dL — ABNORMAL LOW (ref 3.5–5.0)
Alkaline Phosphatase: 120 U/L (ref 38–126)
Anion gap: 14 (ref 5–15)
BUN: 9 mg/dL (ref 8–23)
CO2: 21 mmol/L — ABNORMAL LOW (ref 22–32)
Calcium: 8.9 mg/dL (ref 8.9–10.3)
Chloride: 105 mmol/L (ref 98–111)
Creatinine, Ser: 0.86 mg/dL (ref 0.44–1.00)
GFR, Estimated: >60 mL/min (ref >60)
Glucose, Bld: 91 mg/dL (ref 70–99)
Potassium: 4 mmol/L (ref 3.5–5.1)
Sodium: 140 mmol/L (ref 135–145)
Total Bilirubin: 0.9 mg/dL (ref 0.0–1.2)
Total Protein: 6.9 g/dL (ref 6.5–8.1)

## 2024-01-19 MED ORDER — KETOROLAC TROMETHAMINE 15 MG/ML IJ SOLN
15.0000 mg | Freq: Once | INTRAMUSCULAR | Status: AC
  Administered 2024-01-19: 15 mg via INTRAMUSCULAR
  Filled 2024-01-19: qty 1

## 2024-01-19 MED ORDER — METHOCARBAMOL 500 MG PO TABS: 500.0000 mg | ORAL_TABLET | Freq: Two times a day (BID) | ORAL | 0 refills | Status: AC

## 2024-01-19 MED ORDER — ACETAMINOPHEN 500 MG PO TABS
ORAL_TABLET | ORAL | Status: AC
  Filled 2024-01-19: qty 2

## 2024-01-19 MED ORDER — OXYCODONE HCL 5 MG PO TABS
5.0000 mg | ORAL_TABLET | Freq: Once | ORAL | Status: AC
  Administered 2024-01-19: 5 mg via ORAL
  Filled 2024-01-19: qty 1

## 2024-01-19 MED ORDER — METHYLPREDNISOLONE 4 MG PO TBPK: ORAL_TABLET | ORAL | 0 refills | Status: AC

## 2024-01-19 MED ORDER — DICLOFENAC SODIUM 1 % EX GEL: 4.0000 g | Freq: Four times a day (QID) | CUTANEOUS | 0 refills | Status: AC

## 2024-01-19 MED ORDER — ACETAMINOPHEN 500 MG PO TABS
1000.0000 mg | ORAL_TABLET | Freq: Once | ORAL | Status: AC
  Administered 2024-01-19: 1000 mg via ORAL
  Filled 2024-01-19: qty 2

## 2024-01-19 MED ORDER — OXYCODONE HCL 5 MG PO TABS
5.0000 mg | ORAL_TABLET | Freq: Once | ORAL | Status: DC
Start: 2024-01-19 — End: 2024-01-19
  Filled 2024-01-19: qty 1

## 2024-01-19 NOTE — Discharge Instructions (Signed)
Your back pain is most likely due to a muscular strain.  There is been a lot of research on back pain, unfortunately the only thing that seems to really help is Tylenol and ibuprofen.  Relative rest is also important to not lift greater than 10 pounds bending or twisting at the waist.  Please follow-up with your family physician.  The other thing that really seems to benefit patients is physical therapy which your doctor may send you for.  Please return to the emergency department for new numbness or weakness to your arms or legs. Difficulty with urinating or urinating or pooping on yourself.  Also if you cannot feel toilet paper when you wipe or get a fever.   Take the steroids as prescribed.  Use the gel as prescribed Also take tylenol '1000mg'$ (2 extra strength) four times a day.

## 2024-01-19 NOTE — ED Provider Triage Note (Signed)
 Emergency Medicine Provider Triage Evaluation Note  Sandra Brennan , a 78 y.o. female  was evaluated in triage.  Pt complains of diffuse abdominal and back pain.  This been going on for about 5 hours now.  She woke up to go to the bathroom and had developed this discomfort.  Worse with twisting turning palpation.  She has chronic problems with her back.  Denies trauma denies loss of bowel or bladder denies loss spectra  sensation..  Review of Systems  Positive: AP/back pain Negative: Trauma, cauda equina s/sx  Physical Exam  BP (!) 160/76   Pulse 76   Temp 98 F (36.7 C) (Oral)   Resp 20   SpO2 100%  Gen:   Awake, no distress   Resp:  Normal effort  MSK:   Moves extremities without difficulty  Other:    Medical Decision Making  Medically screening exam initiated at 8:22 AM.  Appropriate orders placed.  Sandra Brennan was informed that the remainder of the evaluation will be completed by another provider, this initial triage assessment does not replace that evaluation, and the importance of remaining in the ED until their evaluation is complete.     Emil Share, DO 01/19/24 [phone]

## 2024-01-19 NOTE — ED Provider Notes (Signed)
 [place] EMERGENCY DEPARTMENT AT [facility] Provider Note   CSN: [id number] Arrival date & time: 01/19/24  9256     Patient presents with: No chief complaint on file.   Sandra Brennan is a 78 y.o. female.   78 yo F with a chief complaints of abdominal and back pain.  Going on since this morning.  Started when she got up to go to the bathroom.  She tells me that she does have chronic back pain from some sort of congenital problem that is not able to be repaired.  Thinks this feels somewhat similar but a bit more severe.  She denies loss of bowel or bladder denies loss of peritoneal sensation denies numbness or weakness to her legs.  She does sometimes have discomfort when she moves her bowels but did not have to move her bowels this morning.  I actually saw the patient earlier in the MSE process.  I had ordered medications for her which she never got.  She tells me that she is feeling a little bit better now than she was earlier.        Prior to Admission medications   Medication Sig Start Date End Date Taking? Authorizing Provider  diclofenac Sodium (VOLTAREN) 1 % GEL Apply 4 g topically 4 (four) times daily. 01/19/24  Yes Emil Share, DO  methocarbamol  (ROBAXIN ) 500 MG tablet Take 1 tablet (500 mg total) by mouth 2 (two) times daily. 01/19/24  Yes Emil Share, DO  methylPREDNISolone  (MEDROL  DOSEPAK) 4 MG TBPK tablet Day 1: 8mg  before breakfast, 4 mg after lunch, 4 mg after supper, and 8 mg at bedtime Day 2: 4 mg before breakfast, 4 mg after lunch, 4 mg  after supper, and 8 mg  at bedtime Day 3:  4 mg  before breakfast, 4 mg  after lunch, 4 mg after supper, and 4 mg  at bedtime Day 4: 4 mg  before breakfast, 4 mg  after lunch, and 4 mg at bedtime Day 5: 4 mg  before breakfast and 4 mg at bedtime Day 6: 4 mg  before breakfast 01/19/24  Yes Indiana Gamero, DO  acetaminophen  (TYLENOL  8 HOUR ARTHRITIS PAIN) 650 MG CR tablet Take 1,300 mg by mouth every 8 (eight) hours as needed (for  headaches).    [provider]  albuterol  (VENTOLIN  HFA) 108 (90 Base) MCG/ACT inhaler INHALE 1 PUFF INTO THE LUNGS EVERY 6 (SIX) HOURS AS NEEDED FOR SHORTNESS OF BREATH OR WHEEZING. 09/10/22   Nche, Roselie Rockford, NP  allopurinol  (ZYLOPRIM ) 100 MG tablet Take 1 tablet (100 mg total) by mouth daily. 10/15/23   Nche, Roselie Rockford, NP  amLODipine  (NORVASC ) 10 MG tablet TAKE 1 TABLET BY MOUTH EVERYDAY AT BEDTIME 07/17/23   Nche, Roselie Rockford, NP  aspirin  EC 81 MG tablet Take 81 mg by mouth daily.    [provider]  atorvastatin  (LIPITOR) 20 MG tablet Take 1 tablet (20 mg total) by mouth every evening. 10/15/23   Nche, Roselie Rockford, NP  cetirizine  (ZYRTEC ) 10 MG tablet Take 10 mg by mouth in the morning.    [provider]  cholecalciferol  (VITAMIN D3) 25 MCG (1000 UNIT) tablet Take 1,000 Units by mouth daily.    [provider]  escitalopram  (LEXAPRO ) 10 MG tablet TAKE 1 TABLET BY MOUTH EVERY DAY 10/08/23   Nche, Roselie Rockford, NP  furosemide  (LASIX ) 20 MG tablet TAKE 1/2 TABLET BY MOUTH DAILY 12/31/23   Nche, Roselie Rockford, NP  lisinopril  (ZESTRIL )  40 MG tablet TAKE 1 TABLET BY MOUTH EVERY DAY 10/08/23   Nche, Roselie Rockford, NP  Multiple Vitamin (MULTIVITAMIN) tablet Take 1 tablet by mouth daily with breakfast.    [provider]  nystatin  (MYCOSTATIN /NYSTOP ) powder APPLY TOPICALLY 3 TIMES A DAY 12/31/23   Nche, Roselie Rockford, NP  omeprazole  (PRILOSEC) 20 MG capsule TAKE 1 CAPSULE BY MOUTH DAILY BEFORE BREAKFAST. 09/15/23   Nche, Roselie Rockford, NP  OPCON-A  0.027-0.315 % SOLN Place 1 drop into both eyes 3 (three) times daily as needed (for itching).    [provider]  polycarbophil (FIBERCON) 625 MG tablet Take 625 mg by mouth daily.    [provider]  Polyethylene Glycol 3350  (MIRALAX  PO) Take 17 g by mouth as needed.    [provider]  Rimegepant Sulfate  (NURTEC) 75 MG TBDP Take 1 tablet (75 mg total) by mouth as needed (take 1 at  onset of headache, max is 75 in 24 hours). 06/25/22   Millikan, Megan, NP  Semaglutide ,0.25 or 0.5MG /DOS, (OZEMPIC , 0.25 OR 0.5 MG/DOSE,) 2 MG/3ML SOPN INJECT 0.5MG  INTO THE SKIN EVERY TUESDAY 12/31/23   Nche, Roselie Rockford, NP  SYMBICORT  160-4.5 MCG/ACT inhaler Inhale 2 puffs into the lungs in the morning and at bedtime. Rinse mouth after each use 10/15/23   Nche, [name] Lum, NP  traMADol  (ULTRAM ) 50 MG tablet Take 1 tablet (50 mg total) by mouth 3 (three) times daily. 07/08/23   Debby Fidela CROME, NP  traMADol  (ULTRAM ) 50 MG tablet Take 1 tablet (50 mg total) by mouth every 6 (six) hours as needed for moderate pain (pain score 4-6). 12/07/23   Vernetta Berg, MD    Allergies: Cymbalta  [duloxetine  hcl], Pineapple, Sulfa antibiotics, Bactoshield chg [chlorhexidine  gluconate], Influenza vaccines, Jardiance  [empagliflozin ], Other, Penicillins, and Theophyllines    Review of Systems  Updated Vital Signs BP (!) 161/75 (BP Location: Right Arm)   Pulse 65   Temp 98.2 F (36.8 C)   Resp 16   SpO2 98%   Physical Exam Vitals and nursing note reviewed.  Constitutional:      General: She is not in acute distress.    Appearance: She is well-developed. She is not diaphoretic.  HENT:     Head: Normocephalic and atraumatic.  Eyes:     Pupils: Pupils are equal, round, and reactive to light.  Cardiovascular:     Rate and Rhythm: Normal rate and regular rhythm.     Heart sounds: No murmur heard.    No friction rub. No gallop.  Pulmonary:     Effort: Pulmonary effort is normal.     Breath sounds: No wheezing or rales.  Abdominal:     General: There is no distension.     Palpations: Abdomen is soft.     Tenderness: There is abdominal tenderness.     Comments: Diffuse tenderness without obvious focality  Musculoskeletal:        General: No tenderness.     Cervical back: Normal range of motion and neck supple.     Comments: Diffuse abdominal and back pain without obvious focality.  No  appreciable difference between midline and lateral back pain.  Pain with twisting turning and sitting up.  Skin:    General: Skin is warm and dry.  Neurological:     Mental Status: She is alert and oriented to person, place, and time.  Psychiatric:        Behavior: Behavior normal.     (all labs ordered are listed, but only  abnormal results are displayed) Labs Reviewed  COMPREHENSIVE METABOLIC PANEL WITH GFR - Abnormal; Notable for the following components:      Result Value   CO2 21 (*)    Albumin  3.3 (*)    AST 177 (*)    ALT 51 (*)    All other components within normal limits  URINALYSIS, ROUTINE W REFLEX MICROSCOPIC - Abnormal; Notable for the following components:   APPearance HAZY (*)    Protein, ur 30 (*)    Bacteria, UA RARE (*)    All other components within normal limits  LIPASE, BLOOD  CBC    EKG: EKG Interpretation Date/Time:  Tuesday January 19 2024 08:26:00 EST Ventricular Rate:  68 PR Interval:  156 QRS Duration:  96 QT Interval:  424 QTC Calculation: 450 R Axis:   20  Text Interpretation: Normal sinus rhythm Nonspecific T wave abnormality Confirmed by Franklyn Gills [phone]) on 01/19/2024 9:43:01 AM  Radiology: US  Abdomen Limited RUQ (LIVER/GB) Result Date: 01/19/2024 EXAM: Right Upper Quadrant Abdominal Ultrasound 01/19/2024 10:18:06 AM TECHNIQUE: Real-time ultrasonography of the right upper quadrant of the abdomen was performed. COMPARISON: US  Abdomen 11/03/2023. CLINICAL HISTORY: Transaminitis, abdominal pain. FINDINGS: LIVER: The liver demonstrates normal echogenicity. No intrahepatic biliary ductal dilatation. No evidence of mass. BILIARY SYSTEM: Gallbladder wall thickness measures 3.2 mm. Mild cholelithiasis. Negative sonographic Murphy's sign. Common bile duct is within normal limits measuring 4.2 mm. OTHER: No right upper quadrant ascites. IMPRESSION: 1. Cholelithiasis without evidence of acute cholecystitis. Electronically signed by: Lynwood Seip MD  01/19/2024 10:49 AM EST RP Workstation: [id number]   CT Renal Stone Study Result Date: 01/19/2024 CLINICAL DATA:  Abdominal/flank pain, stone suspected Back and bilateral flank pain on awakening today. EXAM: CT ABDOMEN AND PELVIS WITHOUT CONTRAST TECHNIQUE: Multidetector CT imaging of the abdomen and pelvis was performed following the standard protocol without IV contrast. RADIATION DOSE REDUCTION: This exam was performed according to the departmental dose-optimization program which includes automated exposure control, adjustment of the mA and/or kV according to patient size and/or use of iterative reconstruction technique. COMPARISON:  Abdominopelvic CT 08/11/2023. FINDINGS: Lower chest: Clear lung bases. No significant pleural or pericardial effusion. Hepatobiliary: The liver has a non cirrhotic morphology without suspicious focal abnormality on noncontrast imaging. Multiple gallstones are again noted. No evidence of gallbladder wall thickening, surrounding inflammation or biliary ductal dilatation. Pancreas: Unremarkable. No pancreatic ductal dilatation or surrounding inflammatory changes. Spleen: Normal in size without focal abnormality. Adrenals/Urinary Tract: Both adrenal glands appear normal. No evidence of urinary tract calculus, suspicious renal lesion or hydronephrosis. Stable 2.9 cm cyst in the lower interpolar region of the right kidney for which no specific follow-up imaging is recommended. The bladder appears unremarkable for its degree of distention, although is partly obscured by artifact from left total hip arthroplasty. Stomach/Bowel: No enteric contrast administered. Small hiatal hernia. The stomach otherwise appears unremarkable for its degree of distension. No evidence of bowel wall thickening, distention or surrounding inflammatory change. The appendix appears normal. Mild distal colonic diverticulosis. Vascular/Lymphatic: There are no enlarged abdominal or pelvic lymph nodes. Mild  aortoiliac atherosclerosis. Reproductive: Status post hysterectomy. The adnexal regions appear stable. Other: No evidence of abdominal wall mass or hernia. No ascites or pneumoperitoneum. Musculoskeletal: No acute or significant osseous findings. Previous left total hip arthroplasty. Severe multilevel lumbar spondylosis. Spine details dictated separately. IMPRESSION: 1. No acute findings or explanation for the patient's symptoms. No evidence of urinary tract calculus or hydronephrosis. 2. Cholelithiasis without evidence of cholecystitis or biliary ductal dilatation.  3. Mild distal colonic diverticulosis without evidence of acute inflammation. 4. Severe multilevel lumbar spondylosis. Spine details dictated separately. 5.  Aortic Atherosclerosis (ICD10-I70.0). Electronically Signed   By: Elsie Perone M.D.   On: 01/19/2024 09:10   CT L-SPINE NO CHARGE Result Date: 01/19/2024 CLINICAL DATA:  Abdominal pain radiating to the back awakening today. EXAM: CT Lumbar Spine without contrast TECHNIQUE: Technique: Multiplanar CT images of the lumbar spine were reconstructed from contemporary CT of the Abdomen and Pelvis. RADIATION DOSE REDUCTION: This exam was performed according to the departmental dose-optimization program which includes automated exposure control, adjustment of the mA and/or kV according to patient size and/or use of iterative reconstruction technique. CONTRAST:  None COMPARISON:  CT lumbar spine 08/20/2020.  Radiographs 10/22/2023. FINDINGS: Segmentation: There are 5 lumbar type vertebral bodies. Alignment: Stable alignment with a mild convex right thoracolumbar scoliosis. There is a minimal degenerative anterolisthesis at L4-5. Vertebrae: No evidence of acute fracture or traumatic subluxation. No aggressive osseous lesions. Multilevel spondylosis with endplate osteophytes and vacuum phenomenon Interbody ankylosis again noted at L1-2. Paraspinal and other soft tissues: No acute paraspinal findings.  Abdominopelvic findings are dictated separately. Disc levels: Multilevel spondylosis with disc space narrowing, a vacuum phenomenon, endplate osteophytes and facet hypertrophy. Resulting multilevel spinal stenosis and foraminal narrowing. At L2-3, there is mild central stenosis with moderate osseous foraminal narrowing bilaterally. At L3-4, there is moderate to severe multifactorial spinal stenosis with mild foraminal narrowing bilaterally. At L4-5, there is severe multifactorial spinal stenosis with mild-to-moderate foraminal narrowing bilaterally. At L5-S1, there is mild spinal stenosis and mild foraminal narrowing bilaterally. IMPRESSION: 1. No evidence of acute lumbar spine fracture, traumatic subluxation or static signs of instability. 2. Multilevel spondylosis with resulting multilevel spinal stenosis and foraminal narrowing as described. 3. No acute paraspinal findings. Abdominopelvic findings are dictated separately. Electronically Signed   By: Elsie Perone M.D.   On: 01/19/2024 09:04     Procedures   Medications Ordered in the ED  acetaminophen  (TYLENOL ) 500 MG tablet (has no administration in time range)  ketorolac  (TORADOL ) 15 MG/ML injection 15 mg (15 mg Intramuscular Given 01/19/24 1306)  acetaminophen  (TYLENOL ) tablet 1,000 mg (1,000 mg Oral Given 01/19/24 1306)  oxyCODONE  (Oxy IR/ROXICODONE ) immediate release tablet 5 mg (5 mg Oral Given 01/19/24 1318)                                    Medical Decision Making Amount and/or Complexity of Data Reviewed Labs: ordered. Radiology: ordered.  Risk OTC drugs. Prescription drug management.   78 yo F with a chief complaints of abdominal and back pain.  Going on since this morning.  Woke up to use the bathroom noodle tonight and had developed pain.  Has a history of chronic back pain.  Feels somewhat similar but more severe.  CT imaging without obvious acute intra-abdominal or intraspinal process.  Incidentally she was found to have  cholelithiasis.  Right upper quadrant ultrasound consistent with cholelithiasis without acute cholecystitis.  She did have mild elevation to her LFTs.  Repeat exam with no obvious focal pain in the right upper quadrant.  Negative Murphy sign for me.  Feeling better.  D/c home.   4:03 PM:  I have discussed the diagnosis/risks/treatment options with the patient.  Evaluation and diagnostic testing in the emergency department does not suggest an emergent condition requiring admission or immediate intervention beyond what has been performed at this  time.  They will follow up with PCP. We also discussed returning to the ED immediately if new or worsening sx occur. We discussed the sx which are most concerning (e.g., sudden worsening pain, fever, inability to tolerate by mouth) that necessitate immediate return. Medications administered to the patient during their visit and any new prescriptions provided to the patient are listed below.  Medications given during this visit Medications  acetaminophen  (TYLENOL ) 500 MG tablet (has no administration in time range)  ketorolac  (TORADOL ) 15 MG/ML injection 15 mg (15 mg Intramuscular Given 01/19/24 1306)  acetaminophen  (TYLENOL ) tablet 1,000 mg (1,000 mg Oral Given 01/19/24 1306)  oxyCODONE  (Oxy IR/ROXICODONE ) immediate release tablet 5 mg (5 mg Oral Given 01/19/24 1318)     The patient appears reasonably screen and/or stabilized for discharge and I doubt any other medical condition or other [facility] requiring further screening, evaluation, or treatment in the ED at this time prior to discharge.       Final diagnoses:  Acute bilateral low back pain without sciatica    ED Discharge Orders          Ordered    diclofenac Sodium (VOLTAREN) 1 % GEL  4 times daily        01/19/24 1344    methocarbamol  (ROBAXIN ) 500 MG tablet  2 times daily        01/19/24 1344    methylPREDNISolone  (MEDROL  DOSEPAK) 4 MG TBPK tablet        01/19/24 1344                Emil Share, DO 01/19/24 1603

## 2024-01-19 NOTE — ED Triage Notes (Signed)
 Pt reports she woke up to use the bathroom this morning and afterwards she started having abdominal pain that radiates to her back on both sides.

## 2024-01-20 NOTE — Progress Notes (Signed)
 Subjective:    Patient ID: Sandra Brennan, female    DOB: 10-10-1945, 78 y.o.   MRN: [id number]  HPI: Sandra Brennan is a 78 y.o. female who returns for follow up appointment for chronic pain and medication refill. She states her pain is located in her bilateral shoulders, lower backradiating into her bilateral lower extremities and bilateral feet with tingling and burning. She rates her pain 8. Her current exercise regime is walking and performing stretching exercises.  Sandra Brennan Morphine  equivalent is 30.00 MME.   Oral Swab was Performed today.      Pain Inventory Average Pain 9 Pain Right Now 8 My pain is sharp, stabbing, and aching  In the last 24 hours, has pain interfered with the following? General activity 7 Relation with others 6 Enjoyment of life 0 What TIME of day is your pain at its worst? morning , daytime, evening, and night Sleep (in general) Fair  Pain is worse with: sitting and standing Pain improves with: rest and medication Relief from Meds: 6  Family History  Problem Relation Age of Onset   Arthritis Mother    Arthritis Father    Colon cancer Father    Diabetes Sister    Arthritis Sister    Diabetes Maternal Grandmother    Arthritis Maternal Grandmother    Diabetes Maternal Grandfather    Arthritis Maternal Grandfather    Migraines Neg Hx    Sleep apnea Neg Hx    Social History   Socioeconomic History   Marital status: Widowed    Spouse name: Not on file   Number of children: 5   Years of education: PhD   Highest education level: Not on file  Occupational History   Occupation: Retired  Tobacco Use   Smoking status: Former    Current packs/day: 1.00    Types: Cigarettes   Smokeless tobacco: Never   Tobacco comments:    08/03/2012 quit 30 years ago  Vaping Use   Vaping status: Never Used  Substance and Sexual Activity   Alcohol use: No    Alcohol/week: 0.0 standard drinks of alcohol    Comment: 08/03/2012 used to be a beeralcoholic;  stopped ~ 30 yr ago   Drug use: No   Sexual activity: Never  Other Topics Concern   Not on file  Social History Narrative   Lives at home with her mother and caregiver.- mother past away 01/2021 and now lives alone.   Occasional use of caffeine.   Right-handed.   Social Drivers of Corporate Investment Banker Strain: Low Risk  (11/30/2023)   Overall Financial Resource Strain (CARDIA)    Difficulty of Paying Living Expenses: Not hard at all  Food Insecurity: No Food Insecurity (11/30/2023)   Hunger Vital Sign    Worried About Running Out of Food in the Last Year: Never true    Ran Out of Food in the Last Year: Never true  Transportation Needs: No Transportation Needs (11/30/2023)   PRAPARE - Administrator, Civil Service (Medical): No    Lack of Transportation (Non-Medical): No  Physical Activity: Inactive (11/30/2023)   Exercise Vital Sign    Days of Exercise per Week: 0 days    Minutes of Exercise per Session: 0 min  Stress: No Stress Concern Present (11/30/2023)   [employer] of Occupational Health - Occupational Stress Questionnaire    Feeling of Stress: Not at all  Social Connections: Moderately Integrated (11/30/2023)   Social Connection and  Isolation Panel    Frequency of Communication with Friends and Family: Three times a week    Frequency of Social Gatherings with Friends and Family: More than three times a week    Attends Religious Services: More than 4 times per year    Active Member of Clubs or Organizations: Yes    Attends Banker Meetings: More than 4 times per year    Marital Status: Widowed   Past Surgical History:  Procedure Laterality Date   ABDOMINAL HYSTERECTOMY  1990's   BREAST CYST EXCISION Right 12/07/2023   Procedure: EXCISION, CYST, BREAST;  Surgeon: Vernetta Berg, MD;  Location: [facility];  Service: General;  Laterality: Right;  EXCISION RIGHT BREAST CYST   GANGLION CYST EXCISION Right 1980's   wrist  (2012-08-30)   JOINT REPLACEMENT     KNEE LIGAMENT RECONSTRUCTION Right 1980's   TONSILLECTOMY  ~ 1954   TOTAL HIP ARTHROPLASTY Left 08/30/12   TOTAL HIP ARTHROPLASTY Left Aug 30, 2012   Procedure: TOTAL HIP ARTHROPLASTY;  Surgeon: Maude LELON Right, MD;  Location: [facility] OR;  Service: Orthopedics;  Laterality: Left;  Left Total Hip Arthroplasty   TOTAL HIP ARTHROPLASTY Left 08/28/2012   Procedure: Irrigation and Debridement hip ;  Surgeon: Lonni CINDERELLA Vernetta, MD;  Location: [facility] OR;  Service: Orthopedics;  Laterality: Left;   TOTAL KNEE ARTHROPLASTY Left 2001   TOTAL KNEE ARTHROPLASTY Right 2004   Past Surgical History:  Procedure Laterality Date   ABDOMINAL HYSTERECTOMY  1990's   BREAST CYST EXCISION Right 12/07/2023   Procedure: EXCISION, CYST, BREAST;  Surgeon: Vernetta Berg, MD;  Location: [facility];  Service: General;  Laterality: Right;  EXCISION RIGHT BREAST CYST   GANGLION CYST EXCISION Right 1980's   wrist (08/30/12)   JOINT REPLACEMENT     KNEE LIGAMENT RECONSTRUCTION Right 1980's   TONSILLECTOMY  ~ 1954   TOTAL HIP ARTHROPLASTY Left Aug 30, 2012   TOTAL HIP ARTHROPLASTY Left 2012/08/30   Procedure: TOTAL HIP ARTHROPLASTY;  Surgeon: Maude LELON Right, MD;  Location: [facility] OR;  Service: Orthopedics;  Laterality: Left;  Left Total Hip Arthroplasty   TOTAL HIP ARTHROPLASTY Left 08/28/2012   Procedure: Irrigation and Debridement hip ;  Surgeon: Lonni CINDERELLA Vernetta, MD;  Location: [facility] OR;  Service: Orthopedics;  Laterality: Left;   TOTAL KNEE ARTHROPLASTY Left 2001   TOTAL KNEE ARTHROPLASTY Right 2004   Past Medical History:  Diagnosis Date   Acute nontraumatic kidney injury 05/05/2013   Acute posthemorrhagic anemia 09/06/2012   Anemia    Arthritis    plenty (2012-08-30)   Asthma    Chest pain 03/30/2016   Chronic headaches    Chronic lower back pain    they say I need a whole new spinal column (2012-08-30)   COPD (chronic obstructive pulmonary disease) (HCC)     Degenerative arthritis    all over (08-30-12)   Fx ankle    GERD (gastroesophageal reflux disease)    Glaucoma    Gout 05/05/2013   Hypertension    Migraines    used to; totally stopped when I quit drinking 30 yr ago (08/30/2012)   Myalgia and myositis    Myocardial infarction [facility]) 1992   08-30-2012 mild MI when son died    Obesity    Osteoporosis    all over (August 30, 2012)   Shortness of breath    when stressed.   Sleep apnea    never did fix my machine; haven't used one for 5 years; I've lost  116# since then; no problems now (08/03/2012)   Syncope 03/30/2016   Type II diabetes mellitus (HCC)    borderline; don't test; take Metformin  (08/03/2012)   There were no vitals taken for this visit.  Opioid Risk Score:   Fall Risk Score:  `1  Depression screen [facility] 2/9     11/30/2023   10:21 AM 10/15/2023   10:33 AM 07/08/2023    8:07 AM 01/15/2023   10:11 AM 01/15/2023    9:28 AM 01/05/2023    8:19 AM 11/28/2022   10:20 AM  Depression screen PHQ 2/9  Decreased Interest 0 0 1 0 0 1 0  Down, Depressed, Hopeless 0 1 1 0 0 1 0  PHQ - 2 Score 0 1 2 0 0 2 0  Altered sleeping 1 3  1      Tired, decreased energy 1 0  1     Change in appetite 1 3  1      Feeling bad or failure about yourself  0 0  0     Trouble concentrating 0 0  0     Moving slowly or fidgety/restless 0 0  0     Suicidal thoughts 0 0  0     PHQ-9 Score 3  7   3       Difficult doing work/chores Somewhat difficult Not difficult at all  Not difficult at all        Data saved with a previous flowsheet row definition    Review of Systems  Musculoskeletal:  Positive for back pain, gait problem and neck pain.       B/L shoulder pain Shin pain  All other systems reviewed and are negative.      Objective:   Physical Exam Vitals and nursing note reviewed.  Constitutional:      Appearance: Normal appearance.  Cardiovascular:     Rate and Rhythm: Normal rate and regular rhythm.     Pulses: Normal pulses.     Heart  sounds: Normal heart sounds.  Pulmonary:     Effort: Pulmonary effort is normal.     Breath sounds: Normal breath sounds.  Musculoskeletal:     Comments: Normal Muscle Bulk and Muscle Testing Reveals:  Upper Extremities: Decreased ROM 45 Degrees and Muscle Strength 5/5 Bilateral AC Joint Tenderness  Thoracic and Lumbar Hypersensitivity Lower Extremities: Decreased ROM and Muscle Strength 5/5 Wearing Right Brace      Skin:    General: Skin is warm and dry.  Neurological:     Mental Status: She is alert and oriented to person, place, and time.  Psychiatric:        Mood and Affect: Mood normal.        Behavior: Behavior normal.          Assessment & Plan:  1. Chronic  Bilateral Shoulder Pain: Continue HEP as Tolerated.  Continue to Monitor. 01/21/2024 2. Lumbar Spinal Stenosis: Continue HEP as Tolerated . Continue to Monitor. 01/21/2024 3. Fibromyalgia: Refilled: Pregabalin  150 mg Twice a day #60. Continue HEP as Tolerated. Continue to Monitor. 01/21/2024 4. Bilateral Greater Trochanter Bursitis: Continue to alternate Ice and Heat Therapy. Continue to Monitor. 01/21/2024. 5. Bilateral Lower Extremities with Neuropathic Pain: Continue Pregablin. Continue to Monitor. 01/21/2024 6.. Chronic Pain Syndrome: Continue :: Tramadol  50 mg one tablet TID as needed for pain. #90  We will continue the opioid monitoring program, this consists of regular clinic visits, examinations, urine drug screen, pill counts as well as use of  [place]  Controlled Substance Reporting system. A 12 month History has been reviewed on the [place]  Controlled Substance Reporting System on 07/08/2023.  Continue to monitor. 01/21/2024 7. Chronic Bilateral Thoracic Pain : Continue HEP as Tolerated. Continue to Monitor. 01/21/2024 8. Cervicalgia: No Complaints today. Continue HEP as Tolerated. Continue to Monitor. 01/21/2024   F/U in  2 months

## 2024-01-21 ENCOUNTER — Encounter: Attending: Registered Nurse | Admitting: Registered Nurse

## 2024-01-21 VITALS — BP 123/76 | HR 80 | Ht 59.0 in | Wt 237.0 lb

## 2024-01-21 DIAGNOSIS — M797 Fibromyalgia: Secondary | ICD-10-CM | POA: Diagnosis present

## 2024-01-21 DIAGNOSIS — M25512 Pain in left shoulder: Secondary | ICD-10-CM | POA: Diagnosis present

## 2024-01-21 DIAGNOSIS — Z79899 Other long term (current) drug therapy: Secondary | ICD-10-CM | POA: Insufficient documentation

## 2024-01-21 DIAGNOSIS — M25511 Pain in right shoulder: Secondary | ICD-10-CM | POA: Diagnosis present

## 2024-01-21 DIAGNOSIS — Z5181 Encounter for therapeutic drug level monitoring: Secondary | ICD-10-CM | POA: Diagnosis present

## 2024-01-21 DIAGNOSIS — M5416 Radiculopathy, lumbar region: Secondary | ICD-10-CM | POA: Insufficient documentation

## 2024-01-21 DIAGNOSIS — G5793 Unspecified mononeuropathy of bilateral lower limbs: Secondary | ICD-10-CM | POA: Insufficient documentation

## 2024-01-21 DIAGNOSIS — G894 Chronic pain syndrome: Secondary | ICD-10-CM | POA: Insufficient documentation

## 2024-01-21 DIAGNOSIS — G8929 Other chronic pain: Secondary | ICD-10-CM | POA: Insufficient documentation

## 2024-01-21 MED ORDER — TRAMADOL HCL 50 MG PO TABS: 50.0000 mg | ORAL_TABLET | Freq: Three times a day (TID) | ORAL | 5 refills | Status: AC

## 2024-01-21 MED ORDER — PREGABALIN 50 MG PO CAPS: 50.0000 mg | ORAL_CAPSULE | Freq: Two times a day (BID) | ORAL | 2 refills | Status: DC

## 2024-01-22 ENCOUNTER — Ambulatory Visit: Payer: Self-pay | Admitting: Nurse Practitioner

## 2024-01-26 LAB — DRUG TOX ALC METAB W/CON, ORAL FLD: Alcohol Metabolite: NEGATIVE ng/mL (ref <25)

## 2024-01-26 LAB — DRUG TOX MONITOR 1 W/CONF, ORAL FLD

## 2024-02-09 ENCOUNTER — Telehealth: Payer: Self-pay | Admitting: Adult Health

## 2024-02-09 NOTE — Telephone Encounter (Signed)
 Pt has r/s missed appointment

## 2024-02-17 ENCOUNTER — Telehealth: Payer: Self-pay

## 2024-02-17 NOTE — Telephone Encounter (Signed)
 Patient called about update on Pregabalin . She states that it does not work as well as when she was on higher dose.

## 2024-02-18 MED ORDER — PREGABALIN 75 MG PO CAPS: 75.0000 mg | ORAL_CAPSULE | Freq: Two times a day (BID) | ORAL | 0 refills | Status: DC

## 2024-02-18 NOTE — Telephone Encounter (Signed)
 Return Ms. Mayol call,  She reports no relief with current pregablin dose, dose was changed. She verbalizes understanding. In the past she was prescribed 150 mg.

## 2024-03-26 ENCOUNTER — Other Ambulatory Visit: Payer: Self-pay | Admitting: Nurse Practitioner

## 2024-03-26 DIAGNOSIS — I10 Essential (primary) hypertension: Secondary | ICD-10-CM

## 2024-03-28 NOTE — Telephone Encounter (Signed)
 Refill request received for Amlodipine  10mg  FOV: 07/14/24 LOV: 01/14/25 Last refill: 07/17/2023 Medication is pending your approval.

## 2024-04-07 ENCOUNTER — Telehealth: Payer: Self-pay | Admitting: *Deleted

## 2024-04-07 NOTE — Telephone Encounter (Signed)
 Mrs Pennel says [facility] to fill [facility] gabapentin  . She has been trying to get it and has been without it for about 2 weeks and is wondering what the problem is. (It looks like she is on pregablin and has not been rx'd since 02/18/24)

## 2024-04-08 ENCOUNTER — Telehealth: Payer: Self-pay | Admitting: Registered Nurse

## 2024-04-08 DIAGNOSIS — M797 Fibromyalgia: Secondary | ICD-10-CM

## 2024-04-08 MED ORDER — PREGABALIN 75 MG PO CAPS: 75.0000 mg | ORAL_CAPSULE | Freq: Two times a day (BID) | ORAL | 5 refills | Status: AC

## 2024-04-08 NOTE — Telephone Encounter (Signed)
 PDMP was Reviewed,   Note was reviewed Call placed to Ms. Delcid regarding the above. Pregabalin  e-scribed to pharmacy, she verbalizes understanding.

## 2024-07-14 ENCOUNTER — Ambulatory Visit: Admitting: Nurse Practitioner

## 2024-07-21 ENCOUNTER — Encounter: Admitting: Registered Nurse

## 2024-12-01 ENCOUNTER — Ambulatory Visit: Admitting: Adult Health

## 2024-12-05 ENCOUNTER — Ambulatory Visit
